# Patient Record
Sex: Female | Born: 1945 | Race: White | Hispanic: No | Marital: Married | Smoking: Former smoker
Health system: Southern US, Community
[De-identification: ages and names within clinical notes are randomized; demographics above are authoritative.]

## PROBLEM LIST (undated history)

## (undated) DIAGNOSIS — R51 Headache: Secondary | ICD-10-CM

## (undated) DIAGNOSIS — F419 Anxiety disorder, unspecified: Secondary | ICD-10-CM

## (undated) DIAGNOSIS — M545 Low back pain, unspecified: Secondary | ICD-10-CM

## (undated) DIAGNOSIS — M069 Rheumatoid arthritis, unspecified: Secondary | ICD-10-CM

## (undated) DIAGNOSIS — J449 Chronic obstructive pulmonary disease, unspecified: Secondary | ICD-10-CM

## (undated) DIAGNOSIS — Z860101 Personal history of adenomatous and serrated colon polyps: Secondary | ICD-10-CM

## (undated) DIAGNOSIS — M792 Neuralgia and neuritis, unspecified: Secondary | ICD-10-CM

## (undated) DIAGNOSIS — I10 Essential (primary) hypertension: Secondary | ICD-10-CM

## (undated) DIAGNOSIS — F32A Depression, unspecified: Secondary | ICD-10-CM

## (undated) DIAGNOSIS — S32010A Wedge compression fracture of first lumbar vertebra, initial encounter for closed fracture: Secondary | ICD-10-CM

## (undated) DIAGNOSIS — IMO0002 Reserved for concepts with insufficient information to code with codable children: Secondary | ICD-10-CM

## (undated) DIAGNOSIS — M719 Bursopathy, unspecified: Secondary | ICD-10-CM

## (undated) DIAGNOSIS — Z9851 Tubal ligation status: Secondary | ICD-10-CM

## (undated) DIAGNOSIS — G609 Hereditary and idiopathic neuropathy, unspecified: Secondary | ICD-10-CM

## (undated) DIAGNOSIS — R519 Headache, unspecified: Secondary | ICD-10-CM

## (undated) DIAGNOSIS — Z86010 Personal history of colonic polyps: Secondary | ICD-10-CM

## (undated) DIAGNOSIS — M810 Age-related osteoporosis without current pathological fracture: Secondary | ICD-10-CM

## (undated) DIAGNOSIS — M1990 Unspecified osteoarthritis, unspecified site: Secondary | ICD-10-CM

## (undated) DIAGNOSIS — I200 Unstable angina: Secondary | ICD-10-CM

## (undated) DIAGNOSIS — M542 Cervicalgia: Secondary | ICD-10-CM

## (undated) DIAGNOSIS — F329 Major depressive disorder, single episode, unspecified: Secondary | ICD-10-CM

## (undated) DIAGNOSIS — G8929 Other chronic pain: Secondary | ICD-10-CM

## (undated) DIAGNOSIS — K219 Gastro-esophageal reflux disease without esophagitis: Secondary | ICD-10-CM

## (undated) DIAGNOSIS — M67919 Unspecified disorder of synovium and tendon, unspecified shoulder: Secondary | ICD-10-CM

## (undated) DIAGNOSIS — W19XXXA Unspecified fall, initial encounter: Secondary | ICD-10-CM

## (undated) DIAGNOSIS — M359 Systemic involvement of connective tissue, unspecified: Secondary | ICD-10-CM

## (undated) DIAGNOSIS — H269 Unspecified cataract: Secondary | ICD-10-CM

## (undated) DIAGNOSIS — E119 Type 2 diabetes mellitus without complications: Secondary | ICD-10-CM

## (undated) DIAGNOSIS — M48061 Spinal stenosis, lumbar region without neurogenic claudication: Secondary | ICD-10-CM

## (undated) HISTORY — PX: CATARACT EXTRACTION W/ INTRAOCULAR LENS  IMPLANT, BILATERAL: SHX1307

## (undated) HISTORY — DX: Spinal stenosis, lumbar region without neurogenic claudication: M48.061

## (undated) HISTORY — DX: Tubal ligation status: Z98.51

## (undated) HISTORY — DX: Hereditary and idiopathic neuropathy, unspecified: G60.9

## (undated) HISTORY — DX: Personal history of adenomatous and serrated colon polyps: Z86.0101

## (undated) HISTORY — PX: BACK SURGERY: SHX140

## (undated) HISTORY — PX: LUMBAR LAMINECTOMY: SHX95

## (undated) HISTORY — DX: Low back pain, unspecified: M54.50

## (undated) HISTORY — DX: Unspecified disorder of synovium and tendon, unspecified shoulder: M67.919

## (undated) HISTORY — DX: Bursopathy, unspecified: M71.9

## (undated) HISTORY — DX: Unspecified cataract: H26.9

## (undated) HISTORY — DX: Low back pain: M54.5

## (undated) HISTORY — PX: TUBAL LIGATION: SHX77

## (undated) HISTORY — PX: BREAST BIOPSY: SHX20

## (undated) HISTORY — PX: OTHER SURGICAL HISTORY: SHX169

## (undated) HISTORY — PX: TONSILLECTOMY: SUR1361

## (undated) HISTORY — DX: Neuralgia and neuritis, unspecified: M79.2

## (undated) HISTORY — DX: Headache: R51

## (undated) HISTORY — PX: WRIST FUSION: SHX839

## (undated) HISTORY — DX: Type 2 diabetes mellitus without complications: E11.9

## (undated) HISTORY — PX: FOOT SURGERY: SHX648

## (undated) HISTORY — DX: Age-related osteoporosis without current pathological fracture: M81.0

## (undated) HISTORY — PX: HUMERUS FRACTURE SURGERY: SHX670

## (undated) HISTORY — PX: POSTERIOR LUMBAR FUSION: SHX6036

## (undated) HISTORY — DX: Headache, unspecified: R51.9

## (undated) HISTORY — DX: Cervicalgia: M54.2

## (undated) HISTORY — DX: Unspecified osteoarthritis, unspecified site: M19.90

## (undated) HISTORY — DX: Unstable angina: I20.0

## (undated) HISTORY — DX: Personal history of colonic polyps: Z86.010

---

## 2004-04-22 ENCOUNTER — Inpatient Hospital Stay: Payer: Self-pay | Admitting: Internal Medicine

## 2004-09-18 ENCOUNTER — Ambulatory Visit: Payer: Self-pay | Admitting: Obstetrics and Gynecology

## 2005-09-19 ENCOUNTER — Ambulatory Visit: Payer: Self-pay | Admitting: Obstetrics and Gynecology

## 2005-11-25 ENCOUNTER — Other Ambulatory Visit: Payer: Self-pay

## 2005-11-25 ENCOUNTER — Inpatient Hospital Stay: Payer: Self-pay | Admitting: Internal Medicine

## 2006-06-06 ENCOUNTER — Ambulatory Visit: Payer: Self-pay | Admitting: Podiatry

## 2006-09-23 ENCOUNTER — Ambulatory Visit: Payer: Self-pay | Admitting: Obstetrics and Gynecology

## 2006-09-25 ENCOUNTER — Emergency Department: Payer: Self-pay | Admitting: Emergency Medicine

## 2006-09-25 ENCOUNTER — Other Ambulatory Visit: Payer: Self-pay

## 2007-05-03 ENCOUNTER — Ambulatory Visit: Payer: Self-pay | Admitting: Emergency Medicine

## 2007-06-17 ENCOUNTER — Ambulatory Visit: Payer: Self-pay | Admitting: Unknown Physician Specialty

## 2007-08-17 ENCOUNTER — Ambulatory Visit: Payer: Self-pay | Admitting: Unknown Physician Specialty

## 2007-08-17 HISTORY — PX: COLONOSCOPY: SHX174

## 2007-10-29 ENCOUNTER — Ambulatory Visit: Payer: Self-pay | Admitting: Obstetrics and Gynecology

## 2007-12-31 ENCOUNTER — Ambulatory Visit: Payer: Self-pay | Admitting: Unknown Physician Specialty

## 2008-01-14 ENCOUNTER — Ambulatory Visit: Payer: Self-pay | Admitting: Unknown Physician Specialty

## 2008-01-14 HISTORY — PX: ESOPHAGOGASTRODUODENOSCOPY: SHX1529

## 2008-02-09 ENCOUNTER — Ambulatory Visit: Payer: Self-pay | Admitting: Gastroenterology

## 2008-02-19 ENCOUNTER — Emergency Department: Payer: Self-pay | Admitting: Emergency Medicine

## 2008-03-24 ENCOUNTER — Ambulatory Visit: Payer: Self-pay | Admitting: Unknown Physician Specialty

## 2008-04-05 ENCOUNTER — Ambulatory Visit: Payer: Self-pay | Admitting: Unknown Physician Specialty

## 2008-08-10 ENCOUNTER — Ambulatory Visit: Payer: Self-pay | Admitting: Unknown Physician Specialty

## 2009-02-28 ENCOUNTER — Ambulatory Visit: Payer: Self-pay | Admitting: Obstetrics and Gynecology

## 2009-05-26 ENCOUNTER — Emergency Department: Payer: Self-pay | Admitting: Emergency Medicine

## 2009-06-27 ENCOUNTER — Emergency Department: Payer: Self-pay | Admitting: Internal Medicine

## 2009-07-05 ENCOUNTER — Inpatient Hospital Stay: Payer: Self-pay | Admitting: Unknown Physician Specialty

## 2009-07-05 ENCOUNTER — Ambulatory Visit: Payer: Self-pay | Admitting: Unknown Physician Specialty

## 2009-07-24 ENCOUNTER — Emergency Department: Payer: Self-pay

## 2009-08-01 ENCOUNTER — Emergency Department: Payer: Self-pay | Admitting: Emergency Medicine

## 2009-09-02 ENCOUNTER — Ambulatory Visit: Payer: Self-pay | Admitting: Unknown Physician Specialty

## 2009-09-12 ENCOUNTER — Encounter: Payer: Self-pay | Admitting: Unknown Physician Specialty

## 2009-09-26 ENCOUNTER — Encounter: Payer: Self-pay | Admitting: Unknown Physician Specialty

## 2009-10-05 ENCOUNTER — Ambulatory Visit: Payer: Self-pay | Admitting: Anesthesiology

## 2009-10-17 ENCOUNTER — Ambulatory Visit: Payer: Self-pay | Admitting: Ophthalmology

## 2009-10-27 ENCOUNTER — Ambulatory Visit: Payer: Self-pay | Admitting: Anesthesiology

## 2009-11-21 ENCOUNTER — Ambulatory Visit: Payer: Self-pay | Admitting: Ophthalmology

## 2009-12-05 ENCOUNTER — Ambulatory Visit: Payer: Self-pay | Admitting: Anesthesiology

## 2010-01-01 ENCOUNTER — Ambulatory Visit: Payer: Self-pay | Admitting: Anesthesiology

## 2010-01-15 ENCOUNTER — Ambulatory Visit: Payer: Self-pay | Admitting: Unknown Physician Specialty

## 2010-02-02 ENCOUNTER — Ambulatory Visit: Payer: Self-pay | Admitting: Anesthesiology

## 2010-03-12 ENCOUNTER — Ambulatory Visit: Payer: Self-pay | Admitting: Obstetrics and Gynecology

## 2010-03-27 ENCOUNTER — Ambulatory Visit: Payer: Self-pay | Admitting: Unknown Physician Specialty

## 2010-03-29 ENCOUNTER — Ambulatory Visit: Payer: Self-pay | Admitting: Internal Medicine

## 2010-04-03 ENCOUNTER — Inpatient Hospital Stay: Payer: Self-pay | Admitting: Unknown Physician Specialty

## 2010-09-19 ENCOUNTER — Ambulatory Visit: Payer: Self-pay | Admitting: Family Medicine

## 2010-10-09 ENCOUNTER — Ambulatory Visit: Payer: Self-pay | Admitting: Unknown Physician Specialty

## 2010-10-09 HISTORY — PX: COLONOSCOPY: SHX174

## 2010-10-10 LAB — PATHOLOGY REPORT

## 2010-12-11 ENCOUNTER — Ambulatory Visit: Payer: Self-pay | Admitting: Internal Medicine

## 2010-12-24 ENCOUNTER — Ambulatory Visit: Payer: Self-pay | Admitting: Internal Medicine

## 2011-01-19 ENCOUNTER — Ambulatory Visit: Payer: Self-pay | Admitting: Internal Medicine

## 2011-01-19 ENCOUNTER — Observation Stay: Payer: Self-pay | Admitting: Internal Medicine

## 2011-02-12 ENCOUNTER — Ambulatory Visit: Payer: Self-pay | Admitting: Anesthesiology

## 2011-02-14 ENCOUNTER — Ambulatory Visit: Payer: Self-pay | Admitting: Anesthesiology

## 2011-03-18 ENCOUNTER — Ambulatory Visit: Payer: Self-pay | Admitting: Anesthesiology

## 2011-03-26 ENCOUNTER — Ambulatory Visit: Payer: Self-pay | Admitting: Obstetrics and Gynecology

## 2011-04-12 ENCOUNTER — Ambulatory Visit: Payer: Self-pay | Admitting: Anesthesiology

## 2011-04-16 ENCOUNTER — Emergency Department: Payer: Self-pay | Admitting: Emergency Medicine

## 2011-06-03 ENCOUNTER — Ambulatory Visit: Payer: Self-pay | Admitting: Surgery

## 2011-06-25 ENCOUNTER — Ambulatory Visit: Payer: Self-pay | Admitting: Unknown Physician Specialty

## 2011-06-25 DIAGNOSIS — I10 Essential (primary) hypertension: Secondary | ICD-10-CM

## 2011-07-02 ENCOUNTER — Inpatient Hospital Stay: Payer: Self-pay | Admitting: Unknown Physician Specialty

## 2011-07-09 ENCOUNTER — Emergency Department: Payer: Self-pay | Admitting: Emergency Medicine

## 2011-10-16 ENCOUNTER — Ambulatory Visit: Payer: Self-pay | Admitting: Family Medicine

## 2011-11-12 ENCOUNTER — Ambulatory Visit: Payer: Self-pay | Admitting: Anesthesiology

## 2011-11-27 ENCOUNTER — Emergency Department: Payer: Self-pay | Admitting: Unknown Physician Specialty

## 2012-01-23 ENCOUNTER — Ambulatory Visit: Payer: Self-pay | Admitting: Anesthesiology

## 2012-02-13 ENCOUNTER — Ambulatory Visit: Payer: Self-pay | Admitting: Unknown Physician Specialty

## 2012-05-17 ENCOUNTER — Ambulatory Visit: Payer: Self-pay | Admitting: Internal Medicine

## 2012-05-26 ENCOUNTER — Ambulatory Visit: Payer: Self-pay | Admitting: Anesthesiology

## 2012-07-07 ENCOUNTER — Ambulatory Visit: Payer: Self-pay | Admitting: Anesthesiology

## 2012-07-29 HISTORY — PX: LUMBAR DISC SURGERY: SHX700

## 2012-08-24 ENCOUNTER — Ambulatory Visit: Payer: Self-pay | Admitting: Podiatry

## 2012-08-25 ENCOUNTER — Ambulatory Visit: Payer: Self-pay | Admitting: Anesthesiology

## 2012-11-10 ENCOUNTER — Ambulatory Visit: Payer: Self-pay | Admitting: Anesthesiology

## 2012-11-26 DIAGNOSIS — S32010A Wedge compression fracture of first lumbar vertebra, initial encounter for closed fracture: Secondary | ICD-10-CM

## 2012-11-26 HISTORY — DX: Wedge compression fracture of first lumbar vertebra, initial encounter for closed fracture: S32.010A

## 2012-12-08 ENCOUNTER — Ambulatory Visit: Payer: Self-pay | Admitting: Anesthesiology

## 2012-12-25 ENCOUNTER — Emergency Department: Payer: Self-pay | Admitting: Emergency Medicine

## 2012-12-26 ENCOUNTER — Inpatient Hospital Stay (HOSPITAL_COMMUNITY)
Admission: EM | Admit: 2012-12-26 | Discharge: 2013-01-05 | DRG: 516 | Disposition: A | Discharge status: Home / Self Care | Payer: MEDICARE | Attending: Internal Medicine | Admitting: Internal Medicine

## 2012-12-26 ENCOUNTER — Encounter (HOSPITAL_COMMUNITY): Payer: Self-pay | Admitting: *Deleted

## 2012-12-26 ENCOUNTER — Emergency Department (HOSPITAL_COMMUNITY): Payer: MEDICARE

## 2012-12-26 DIAGNOSIS — M51379 Other intervertebral disc degeneration, lumbosacral region without mention of lumbar back pain or lower extremity pain: Secondary | ICD-10-CM | POA: Diagnosis present

## 2012-12-26 DIAGNOSIS — M549 Dorsalgia, unspecified: Secondary | ICD-10-CM

## 2012-12-26 DIAGNOSIS — T84498A Other mechanical complication of other internal orthopedic devices, implants and grafts, initial encounter: Secondary | ICD-10-CM | POA: Diagnosis present

## 2012-12-26 DIAGNOSIS — R4182 Altered mental status, unspecified: Secondary | ICD-10-CM | POA: Diagnosis not present

## 2012-12-26 DIAGNOSIS — M5137 Other intervertebral disc degeneration, lumbosacral region: Secondary | ICD-10-CM | POA: Diagnosis present

## 2012-12-26 DIAGNOSIS — X58XXXA Exposure to other specified factors, initial encounter: Secondary | ICD-10-CM | POA: Diagnosis present

## 2012-12-26 DIAGNOSIS — S32009A Unspecified fracture of unspecified lumbar vertebra, initial encounter for closed fracture: Principal | ICD-10-CM | POA: Diagnosis present

## 2012-12-26 DIAGNOSIS — Y832 Surgical operation with anastomosis, bypass or graft as the cause of abnormal reaction of the patient, or of later complication, without mention of misadventure at the time of the procedure: Secondary | ICD-10-CM | POA: Diagnosis present

## 2012-12-26 DIAGNOSIS — M545 Low back pain: Secondary | ICD-10-CM

## 2012-12-26 DIAGNOSIS — M069 Rheumatoid arthritis, unspecified: Secondary | ICD-10-CM

## 2012-12-26 DIAGNOSIS — R52 Pain, unspecified: Secondary | ICD-10-CM | POA: Diagnosis present

## 2012-12-26 DIAGNOSIS — Z981 Arthrodesis status: Secondary | ICD-10-CM

## 2012-12-26 DIAGNOSIS — R443 Hallucinations, unspecified: Secondary | ICD-10-CM | POA: Diagnosis not present

## 2012-12-26 DIAGNOSIS — M5459 Other low back pain: Secondary | ICD-10-CM

## 2012-12-26 DIAGNOSIS — T40605A Adverse effect of unspecified narcotics, initial encounter: Secondary | ICD-10-CM | POA: Diagnosis not present

## 2012-12-26 DIAGNOSIS — R109 Unspecified abdominal pain: Secondary | ICD-10-CM | POA: Diagnosis present

## 2012-12-26 DIAGNOSIS — IMO0002 Reserved for concepts with insufficient information to code with codable children: Secondary | ICD-10-CM

## 2012-12-26 DIAGNOSIS — I10 Essential (primary) hypertension: Secondary | ICD-10-CM

## 2012-12-26 DIAGNOSIS — M5126 Other intervertebral disc displacement, lumbar region: Secondary | ICD-10-CM

## 2012-12-26 DIAGNOSIS — F17200 Nicotine dependence, unspecified, uncomplicated: Secondary | ICD-10-CM | POA: Diagnosis present

## 2012-12-26 DIAGNOSIS — K5900 Constipation, unspecified: Secondary | ICD-10-CM | POA: Diagnosis present

## 2012-12-26 HISTORY — DX: Essential (primary) hypertension: I10

## 2012-12-26 HISTORY — DX: Rheumatoid arthritis, unspecified: M06.9

## 2012-12-26 HISTORY — DX: Reserved for concepts with insufficient information to code with codable children: IMO0002

## 2012-12-26 LAB — URINALYSIS, ROUTINE W REFLEX MICROSCOPIC
Glucose, UA: NEGATIVE mg/dL
Ketones, ur: NEGATIVE mg/dL
Leukocytes, UA: NEGATIVE
Nitrite: NEGATIVE
Specific Gravity, Urine: 1.021 (ref 1.005–1.030)
pH: 6.5 (ref 5.0–8.0)

## 2012-12-26 LAB — CBC WITH DIFFERENTIAL/PLATELET
HCT: 40.2 % (ref 36.0–46.0)
Hemoglobin: 14 g/dL (ref 12.0–15.0)
Lymphocytes Relative: 6 % — ABNORMAL LOW (ref 12–46)
Monocytes Absolute: 1.2 10*3/uL — ABNORMAL HIGH (ref 0.1–1.0)
Monocytes Relative: 7 % (ref 3–12)
Neutro Abs: 14.4 10*3/uL — ABNORMAL HIGH (ref 1.7–7.7)
Neutrophils Relative %: 86 % — ABNORMAL HIGH (ref 43–77)
RBC: 4.24 MIL/uL (ref 3.87–5.11)
WBC: 16.6 10*3/uL — ABNORMAL HIGH (ref 4.0–10.5)

## 2012-12-26 LAB — BASIC METABOLIC PANEL
BUN: 27 mg/dL — ABNORMAL HIGH (ref 6–23)
CO2: 25 mEq/L (ref 19–32)
Chloride: 102 mEq/L (ref 96–112)
Creatinine, Ser: 0.58 mg/dL (ref 0.50–1.10)
Potassium: 3.4 mEq/L — ABNORMAL LOW (ref 3.5–5.1)

## 2012-12-26 MED ORDER — ALUM & MAG HYDROXIDE-SIMETH 200-200-20 MG/5ML PO SUSP
30.0000 mL | Freq: Four times a day (QID) | ORAL | Status: DC | PRN
  Administered 2012-12-28 – 2013-01-04 (×3): 30 mL via ORAL
  Filled 2012-12-26 (×3): qty 30

## 2012-12-26 MED ORDER — ONDANSETRON HCL 4 MG PO TABS: 4.0000 mg | ORAL_TABLET | Freq: Four times a day (QID) | ORAL | Status: DC | PRN

## 2012-12-26 MED ORDER — HYDROMORPHONE HCL PF 1 MG/ML IJ SOLN
1.0000 mg | Freq: Once | INTRAMUSCULAR | Status: DC
Start: 2012-12-26 — End: 2012-12-26

## 2012-12-26 MED ORDER — MORPHINE SULFATE 4 MG/ML IJ SOLN
8.0000 mg | Freq: Once | INTRAMUSCULAR | Status: AC
  Administered 2012-12-26: 8 mg via INTRAVENOUS
  Filled 2012-12-26 (×2): qty 2

## 2012-12-26 MED ORDER — KETOROLAC TROMETHAMINE 30 MG/ML IJ SOLN
30.0000 mg | Freq: Once | INTRAMUSCULAR | Status: AC
  Administered 2012-12-26: 30 mg via INTRAVENOUS
  Filled 2012-12-26: qty 1

## 2012-12-26 MED ORDER — HYDROMORPHONE HCL PF 2 MG/ML IJ SOLN: 2.0000 mg | INTRAMUSCULAR | Status: DC | PRN

## 2012-12-26 MED ORDER — ACETAMINOPHEN 650 MG RE SUPP: 650.0000 mg | Freq: Four times a day (QID) | RECTAL | Status: DC | PRN

## 2012-12-26 MED ORDER — DIAZEPAM 5 MG/ML IJ SOLN
5.0000 mg | Freq: Once | INTRAMUSCULAR | Status: AC
  Administered 2012-12-26: 5 mg via INTRAVENOUS
  Filled 2012-12-26: qty 2

## 2012-12-26 MED ORDER — ZOLPIDEM TARTRATE 5 MG PO TABS
5.0000 mg | ORAL_TABLET | Freq: Every evening | ORAL | Status: DC | PRN
  Administered 2012-12-27 – 2013-01-03 (×2): 5 mg via ORAL
  Filled 2012-12-26 (×2): qty 1

## 2012-12-26 MED ORDER — ENOXAPARIN SODIUM 40 MG/0.4ML ~~LOC~~ SOLN
40.0000 mg | Freq: Every day | SUBCUTANEOUS | Status: DC
  Administered 2012-12-27 – 2013-01-03 (×8): 40 mg via SUBCUTANEOUS
  Filled 2012-12-26 (×8): qty 0.4

## 2012-12-26 MED ORDER — OXYCODONE HCL 5 MG PO TABS
5.0000 mg | ORAL_TABLET | ORAL | Status: DC | PRN
  Administered 2012-12-27 – 2012-12-29 (×7): 5 mg via ORAL
  Filled 2012-12-26 (×7): qty 1

## 2012-12-26 MED ORDER — ACETAMINOPHEN 325 MG PO TABS
650.0000 mg | ORAL_TABLET | Freq: Four times a day (QID) | ORAL | Status: DC | PRN
  Administered 2013-01-03: 650 mg via ORAL
  Filled 2012-12-26: qty 2

## 2012-12-26 MED ORDER — SODIUM CHLORIDE 0.9 % IV BOLUS (SEPSIS)
1000.0000 mL | Freq: Once | INTRAVENOUS | Status: AC
  Administered 2012-12-26: 1000 mL via INTRAVENOUS

## 2012-12-26 MED ORDER — MORPHINE SULFATE 4 MG/ML IJ SOLN
6.0000 mg | Freq: Once | INTRAMUSCULAR | Status: AC
  Administered 2012-12-26: 6 mg via INTRAVENOUS
  Filled 2012-12-26: qty 2

## 2012-12-26 MED ORDER — SODIUM CHLORIDE 0.9 % IV SOLN
INTRAVENOUS | Status: DC
  Administered 2012-12-27 – 2012-12-30 (×6): via INTRAVENOUS

## 2012-12-26 MED ORDER — ONDANSETRON HCL 4 MG/2ML IJ SOLN: 4.0000 mg | Freq: Four times a day (QID) | INTRAMUSCULAR | Status: DC | PRN

## 2012-12-26 NOTE — ED Provider Notes (Signed)
History     CSN: [id number]  Arrival date & time 12/26/12  1557   First MD Initiated Contact with Patient 12/26/12 1710      Chief Complaint  Patient presents with  . Back Pain    (Consider location/radiation/quality/duration/timing/severity/associated sxs/prior treatment) The history is provided by the patient.  Michelle Cabrera is a 67 y.o. female history of hypertension, chronic back pain status post multiple spine surgeries at [place] here with worsening back pain. Worsening back pain for the last 3 days. She denies any recent trauma or falls. The pain is severe enough that she is unable to walk. She went to [place] yesterday and got IM morphine and valium and was sent home. She was still having pain despite taking hydrocodone and was told by her rheumatologist to come here for neurosurgical eval. No incontinence or numbness, just pain.    Past Medical History  Diagnosis Date  . Hypertension     History reviewed. No pertinent past surgical history.  No family history on file.  History  Substance Use Topics  . Smoking status: Current Every Day Smoker  . Smokeless tobacco: Not on file  . Alcohol Use: No    OB History   Grav Para Term Preterm Abortions TAB SAB Ect Mult Living                  Review of Systems  Musculoskeletal: Positive for back pain.  All other systems reviewed and are negative.    Allergies  Dilaudid  Home Medications   Current Outpatient Rx  Name  Route  Sig  Dispense  Refill  . amLODipine (NORVASC) 5 MG tablet   Oral   Take 5 mg by mouth daily.         . Biotin 5000 MCG TABS   Oral   Take 5,000 mcg by mouth daily.         . Calcium Carbonate-Vit D-Min (CALCIUM-VITAMIN D-MINERALS) 600-400 MG-UNIT CHEW   Oral   Chew 4 tablets by mouth daily.         . cyclobenzaprine (FLEXERIL) 5 MG tablet   Oral   Take 5 mg by mouth 2 (two) times daily as needed for muscle spasms.         . DULoxetine (CYMBALTA) 30 MG capsule   Oral    Take 30 mg by mouth 2 (two) times daily.         Marland Kitchen esomeprazole (NEXIUM) 40 MG capsule   Oral   Take 40 mg by mouth daily before breakfast.         . Methotrexate, PF, 25 MG/0.4ML SOAJ   Subcutaneous   Inject 50 mg into the skin once a week.         Marland Kitchen oxyCODONE-acetaminophen (PERCOCET/ROXICET) 5-325 MG per tablet   Oral   Take 1 tablet by mouth every 6 (six) hours as needed for pain.         . predniSONE (DELTASONE) 50 MG tablet   Oral   Take 50 mg by mouth daily.         . Tofacitinib Citrate (XELJANZ) 5 MG TABS   Oral   Take 5 mg by mouth 2 (two) times daily.           BP 142/65  Pulse 81  Temp(Src) 98.7 F (37.1 C) (Oral)  Resp 24  SpO2 97%  Physical Exam  Nursing note and vitals reviewed. Constitutional: She is oriented to person, place, and time.  Uncomfortable, in  fetal position   HENT:  Head: Normocephalic.  Mouth/Throat: Oropharynx is clear and moist.  Eyes: Conjunctivae are normal. Pupils are equal, round, and reactive to light.  Neck: Normal range of motion. Neck supple.  Cardiovascular: Normal rate, regular rhythm and normal heart sounds.   Pulmonary/Chest: Effort normal and breath sounds normal. No respiratory distress. She has no wheezes. She has no rales.  Abdominal: Soft. Bowel sounds are normal. She exhibits no distension. There is no tenderness. There is no rebound.  Musculoskeletal: Normal range of motion.  Mild L paralumbar tenderness   Neurological: She is alert and oriented to person, place, and time.  4/5 lower extremities. Unable to perform straight leg raise since she has severe pain laying on her back. No saddle anesthesia. 2+ pulses   Skin: Skin is warm and dry.  Psychiatric: She has a normal mood and affect. Her behavior is normal. Judgment and thought content normal.    ED Course  Procedures (including critical care time)  Labs Reviewed  CBC WITH DIFFERENTIAL - Abnormal; Notable for the following:    WBC 16.6 (*)     Platelets 477 (*)    Neutrophils Relative % 86 (*)    Neutro Abs 14.4 (*)    Lymphocytes Relative 6 (*)    Monocytes Absolute 1.2 (*)    All other components within normal limits  BASIC METABOLIC PANEL - Abnormal; Notable for the following:    Potassium 3.4 (*)    Glucose, Bld 117 (*)    BUN 27 (*)    All other components within normal limits  URINALYSIS, ROUTINE W REFLEX MICROSCOPIC - Abnormal; Notable for the following:    APPearance CLOUDY (*)    All other components within normal limits   Mr Lumbar Spine Wo Contrast  12/26/2012   *RADIOLOGY REPORT*  Clinical Data: Weakness.  Lumbar disc disease.  Status post epidural injection 12/10/2012 with continued progression of symptoms.  Low back pain extending into the left lower extremity.  MRI LUMBAR SPINE WITHOUT CONTRAST  Technique:  Multiplanar and multiecho pulse sequences of the lumbar spine were obtained without intravenous contrast.  Comparison: None.  Findings: Normal signal is present in the conus medullaris which terminates at T12-L1, within normal limits.  The patient is status post posterior fusion at L2-3 and L3-4.  Edematous end plate marrow changes are present on the right at L1-2.  Leftward curvature of the lumbar spine is centered at L2-3.  The vertebral body heights and AP alignment are normal.  Limited imaging of the abdomen is unremarkable.  A benign appearing fluid collection is evident to below the subcutaneous fat at the fascial margin.  The disc levels at T12 and above are normal.  L1-2:  A rightward disc herniation is present.  Mild facet hypertrophy is noted.  This results in mild right lateral recess and foraminal narrowing.  L2-3:  The disc spacer is at the posterior margin of the vertebral body.  Mild right lateral recess and foraminal narrowing is present.  L3-4:  Patient is status post laminectomy.  The central canal and foramina are widely patent.  L4-5:  A mild leftward disc herniation is present.  Mild left foraminal  narrowing is evident.  L5-S1:  Moderate facet hypertrophy is worse on the right.  There is no significant stenosis.  IMPRESSION:  1.  Rightward disc herniation at L1-2 in concert with facet hypertrophy results in mild right lateral recess and foraminal narrowing. 2.  Mild right lateral recess and foraminal  narrowing at L2-3 with slight posterior displacement of the disc spacer. 3.  Status post laminectomy at L3-4 without evidence for residual or recurrent stenosis. 4.  Mild left foraminal narrowing at L4-5. 5.  Asymmetric right-sided facet arthropathy at L5-S1 without significant stenosis.   Original Report Authenticated By: Marin Roberts, M.D.     No diagnosis found.    MDM  Michelle Cabrera is a 67 y.o. female here with back pain. Likely worsening chronic pain possibly from slipped disk. Will get MRI and give pain meds and reassess.   8 PM WBC 16, UA nl. No epidural abscess on MRI. MRI showed L1-2 disc herniation and diffuse disease with no spinal compression.   10:57 PM Patient still in pain after morphine, toradol and valium. Will admit for pain control on med/surg under Dr. Lovell Sheehan.        Richardean Canal, MD 12/26/12 [phone]

## 2012-12-26 NOTE — ED Notes (Signed)
Patient transported to MRI via stretcher.

## 2012-12-26 NOTE — ED Notes (Signed)
The pt is c/o lower back pain.  She has had back surgeries and has chronic back pain.  She has had more pain since Wednesday she was seen at [facility] ed yesterday and had shots.  She still has pain

## 2012-12-27 ENCOUNTER — Encounter (HOSPITAL_COMMUNITY): Payer: Self-pay | Admitting: Internal Medicine

## 2012-12-27 ENCOUNTER — Inpatient Hospital Stay (HOSPITAL_COMMUNITY): Payer: MEDICARE

## 2012-12-27 DIAGNOSIS — M5126 Other intervertebral disc displacement, lumbar region: Secondary | ICD-10-CM

## 2012-12-27 DIAGNOSIS — M545 Low back pain: Secondary | ICD-10-CM

## 2012-12-27 DIAGNOSIS — IMO0002 Reserved for concepts with insufficient information to code with codable children: Secondary | ICD-10-CM | POA: Diagnosis present

## 2012-12-27 DIAGNOSIS — M549 Dorsalgia, unspecified: Secondary | ICD-10-CM

## 2012-12-27 DIAGNOSIS — I10 Essential (primary) hypertension: Secondary | ICD-10-CM | POA: Diagnosis present

## 2012-12-27 DIAGNOSIS — M069 Rheumatoid arthritis, unspecified: Secondary | ICD-10-CM

## 2012-12-27 LAB — BASIC METABOLIC PANEL
BUN: 19 mg/dL (ref 6–23)
CO2: 28 mEq/L (ref 19–32)
Calcium: 8.6 mg/dL (ref 8.4–10.5)
Chloride: 104 mEq/L (ref 96–112)
Creatinine, Ser: 0.64 mg/dL (ref 0.50–1.10)
Glucose, Bld: 129 mg/dL — ABNORMAL HIGH (ref 70–99)

## 2012-12-27 LAB — CBC
HCT: 37.6 % (ref 36.0–46.0)
MCH: 31.8 pg (ref 26.0–34.0)
MCHC: 33.2 g/dL (ref 30.0–36.0)
MCV: 95.7 fL (ref 78.0–100.0)
Platelets: 444 10*3/uL — ABNORMAL HIGH (ref 150–400)
RDW: 15.1 % (ref 11.5–15.5)

## 2012-12-27 MED ORDER — CYCLOBENZAPRINE HCL 5 MG PO TABS
7.5000 mg | ORAL_TABLET | Freq: Three times a day (TID) | ORAL | Status: DC | PRN
  Administered 2012-12-27 – 2013-01-03 (×8): 7.5 mg via ORAL
  Filled 2012-12-27 (×8): qty 1.5

## 2012-12-27 MED ORDER — PREGABALIN 25 MG PO CAPS
75.0000 mg | ORAL_CAPSULE | Freq: Three times a day (TID) | ORAL | Status: DC
Start: 2012-12-27 — End: 2013-01-05
  Administered 2012-12-27 – 2013-01-05 (×28): 75 mg via ORAL
  Filled 2012-12-27 (×13): qty 3
  Filled 2012-12-27: qty 2
  Filled 2012-12-27: qty 1
  Filled 2012-12-27: qty 3
  Filled 2012-12-27: qty 1
  Filled 2012-12-27 (×6): qty 3
  Filled 2012-12-27: qty 2
  Filled 2012-12-27 (×2): qty 3
  Filled 2012-12-27: qty 2
  Filled 2012-12-27 (×3): qty 3
  Filled 2012-12-27: qty 1

## 2012-12-27 MED ORDER — MORPHINE SULFATE 2 MG/ML IJ SOLN
2.0000 mg | INTRAMUSCULAR | Status: DC | PRN
  Administered 2012-12-27 (×3): 4 mg via INTRAVENOUS
  Administered 2012-12-27: 2 mg via INTRAVENOUS
  Administered 2012-12-28 – 2012-12-30 (×11): 4 mg via INTRAVENOUS
  Filled 2012-12-27 (×16): qty 2

## 2012-12-27 MED ORDER — NICOTINE 14 MG/24HR TD PT24
14.0000 mg | MEDICATED_PATCH | Freq: Every day | TRANSDERMAL | Status: DC
  Administered 2012-12-27 – 2013-01-05 (×9): 14 mg via TRANSDERMAL
  Filled 2012-12-27 (×10): qty 1

## 2012-12-27 NOTE — H&P (Signed)
[facility] History and Physical  Michelle Cabrera AVW:[id number] DOB: November 14, 1945 DOA: 12/26/2012  Referring physician: EDP PCP: Pcp Not In System  Specialists:   Chief Complaint: Severe Low Back Pain  HPI: Michelle Cabrera is a 67 y.o. female with a history of Rheumatoid Arthritis and DDD who presents to the ED with complaints of severe intractable back pain.   She reports having paithat has been worse over the past 3 days, and she went to the ED at [place] 1 days ago and was seen and administered injections but still had no relief.  She report s having increased pain and difficulty walking due to the pain.  The pain is described as 10/10 shrap and burning pain that radiates from he back into both of her upper legs.   She denies having any loss of bowel or bladder control.      Review of Systems: The patient denies anorexia, fever, chills, weight loss, vision loss, decreased hearing, hoarseness, chest pain, syncope, dyspnea on exertion, peripheral edema, balance deficits, hemoptysis, abdominal pain, nausea, vomiting, diarrhea, constipation, hematemesis, melena, hematochezia, severe indigestion/heartburn, hematuria, incontinence, genital sores, muscle weakness, suspicious skin lesions, transient blindness, depression, unusual weight change, abnormal bleeding, enlarged lymph nodes, angioedema, and breast masses.    Past Medical History  Diagnosis Date  . Hypertension   . Rheumatoid arthritis   . DDD (degenerative disc disease)      Past Surgical History  Procedure Laterality Date  . Lumbar disc surgery       X 3     Medications:  HOME MEDS: Prior to Admission medications   Medication Sig Start Date End Date Taking? Authorizing Provider  amLODipine (NORVASC) 5 MG tablet Take 5 mg by mouth daily.   Yes Historical Provider, MD  Biotin 5000 MCG TABS Take 5,000 mcg by mouth daily.   Yes Historical Provider, MD  Calcium Carbonate-Vit D-Min (CALCIUM-VITAMIN D-MINERALS) 600-400 MG-UNIT  CHEW Chew 4 tablets by mouth daily.   Yes Historical Provider, MD  cyclobenzaprine (FLEXERIL) 5 MG tablet Take 5 mg by mouth 2 (two) times daily as needed for muscle spasms.   Yes Historical Provider, MD  DULoxetine (CYMBALTA) 30 MG capsule Take 30 mg by mouth 2 (two) times daily.   Yes Historical Provider, MD  esomeprazole (NEXIUM) 40 MG capsule Take 40 mg by mouth daily before breakfast.   Yes Historical Provider, MD  Methotrexate, PF, 25 MG/0.4ML SOAJ Inject 50 mg into the skin once a week.   Yes Historical Provider, MD  oxyCODONE-acetaminophen (PERCOCET/ROXICET) 5-325 MG per tablet Take 1 tablet by mouth every 6 (six) hours as needed for pain.   Yes Historical Provider, MD  predniSONE (DELTASONE) 50 MG tablet Take 50 mg by mouth daily.   Yes Historical Provider, MD  Tofacitinib Citrate (XELJANZ) 5 MG TABS Take 5 mg by mouth 2 (two) times daily.   Yes Historical Provider, MD    Allergies:  Allergies  Allergen Reactions  . Dilaudid (Hydromorphone Hcl)     Made tongue swell    Social History:   reports that she has been smoking.  She does not have any smokeless tobacco history on file. She reports that she does not drink alcohol. Her drug history is not on file.  Family History: Family History  Problem Relation Age of Onset  . CAD Father   . CAD Mother   . Hypertension Mother   . Peripheral vascular disease Mother     Physical Exam:  GEN:  Pleasant 67 year old  examined  and in no acute distress; cooperative with exam Filed Vitals:   12/26/12 2030 12/26/12 2100 12/26/12 2130 12/26/12 2200  BP: 143/65 154/78 140/60 142/65  Pulse: 89 92 87 81  Temp:      TempSrc:      Resp:      SpO2: 98% 98% 98% 97%   Blood pressure 142/65, pulse 81, temperature 98.7 F (37.1 C), temperature source Oral, resp. rate 24, SpO2 97.00%. PSYCH: She is alert and oriented x4; does not appear anxious does not appear depressed; affect is normal HEENT: Normocephalic and Atraumatic, Mucous membranes  pink; PERRLA; EOM intact; Fundi:  Benign;  No scleral icterus, Nares: Patent, Oropharynx: Clear,Fair Dentition, Neck:  FROM, no cervical lymphadenopathy nor thyromegaly or carotid bruit; no JVD; Breasts:: Not examined CHEST WALL: No tenderness CHEST: Normal respiration, clear to auscultation bilaterally HEART: Regular rate and rhythm; no murmurs rubs or gallops BACK: No kyphosis or scoliosis; no CVA tenderness ABDOMEN: Positive Bowel Sounds, Scaphoid, Obese, soft non-tender; no masses, no organomegaly, no pannus; no intertriginous candida. Rectal Exam: Not done EXTREMITIES: No cyanosis, clubbing or edema; no ulcerations. Genitalia: not examined PULSES: 2+ and symmetric SKIN: Normal hydration no rash or ulceration CNS: Cranial nerves 2-12 grossly intact no focal neurologic deficit   Labs & Imaging Results for orders placed during the hospital encounter of 12/26/12 (from the past 48 hour(s))  CBC WITH DIFFERENTIAL     Status: Abnormal   Collection Time    12/26/12  5:26 PM      Result Value Range   WBC 16.6 (*) 4.0 - 10.5 K/uL   RBC 4.24  3.87 - 5.11 MIL/uL   Hemoglobin 14.0  12.0 - 15.0 g/dL   HCT 16.1  09.6 - 04.5 %   MCV 94.8  78.0 - 100.0 fL   MCH 33.0  26.0 - 34.0 pg   MCHC 34.8  30.0 - 36.0 g/dL   RDW 40.9  81.1 - 91.4 %   Platelets 477 (*) 150 - 400 K/uL   Neutrophils Relative % 86 (*) 43 - 77 %   Neutro Abs 14.4 (*) 1.7 - 7.7 K/uL   Lymphocytes Relative 6 (*) 12 - 46 %   Lymphs Abs 1.0  0.7 - 4.0 K/uL   Monocytes Relative 7  3 - 12 %   Monocytes Absolute 1.2 (*) 0.1 - 1.0 K/uL   Eosinophils Relative 0  0 - 5 %   Eosinophils Absolute 0.0  0.0 - 0.7 K/uL   Basophils Relative 0  0 - 1 %   Basophils Absolute 0.0  0.0 - 0.1 K/uL  BASIC METABOLIC PANEL     Status: Abnormal   Collection Time    12/26/12  5:26 PM      Result Value Range   Sodium 137  135 - 145 mEq/L   Potassium 3.4 (*) 3.5 - 5.1 mEq/L   Chloride 102  96 - 112 mEq/L   CO2 25  19 - 32 mEq/L   Glucose, Bld  117 (*) 70 - 99 mg/dL   BUN 27 (*) 6 - 23 mg/dL   Creatinine, Ser 7.82  0.50 - 1.10 mg/dL   Calcium 9.4  8.4 - 95.6 mg/dL   GFR calc non Af Amer >90  >90 mL/min   GFR calc Af Amer >90  >90 mL/min   Comment:            The eGFR has been calculated     using the CKD EPI  equation.     This calculation has not been     validated in all clinical     situations.     eGFR's persistently     <90 mL/min signify     possible Chronic Kidney Disease.  URINALYSIS, ROUTINE W REFLEX MICROSCOPIC     Status: Abnormal   Collection Time    12/26/12  8:33 PM      Result Value Range   Color, Urine YELLOW  YELLOW   APPearance CLOUDY (*) CLEAR   Specific Gravity, Urine 1.021  1.005 - 1.030   pH 6.5  5.0 - 8.0   Glucose, UA NEGATIVE  NEGATIVE mg/dL   Hgb urine dipstick NEGATIVE  NEGATIVE   Bilirubin Urine NEGATIVE  NEGATIVE   Ketones, ur NEGATIVE  NEGATIVE mg/dL   Protein, ur NEGATIVE  NEGATIVE mg/dL   Urobilinogen, UA 1.0  0.0 - 1.0 mg/dL   Nitrite NEGATIVE  NEGATIVE   Leukocytes, UA NEGATIVE  NEGATIVE   Comment: MICROSCOPIC NOT DONE ON URINES WITH NEGATIVE PROTEIN, BLOOD, LEUKOCYTES, NITRITE, OR GLUCOSE <1000 mg/dL.    Radiological Exams on Admission: Mr Lumbar Spine Wo Contrast  12/26/2012   *RADIOLOGY REPORT*  Clinical Data: Weakness.  Lumbar disc disease.  Status post epidural injection 12/10/2012 with continued progression of symptoms.  Low back pain extending into the left lower extremity.  MRI LUMBAR SPINE WITHOUT CONTRAST  Technique:  Multiplanar and multiecho pulse sequences of the lumbar spine were obtained without intravenous contrast.  Comparison: None.  Findings: Normal signal is present in the conus medullaris which terminates at T12-L1, within normal limits.  The patient is status post posterior fusion at L2-3 and L3-4.  Edematous end plate marrow changes are present on the right at L1-2.  Leftward curvature of the lumbar spine is centered at L2-3.  The vertebral body heights and AP  alignment are normal.  Limited imaging of the abdomen is unremarkable.  A benign appearing fluid collection is evident to below the subcutaneous fat at the fascial margin.  The disc levels at T12 and above are normal.  L1-2:  A rightward disc herniation is present.  Mild facet hypertrophy is noted.  This results in mild right lateral recess and foraminal narrowing.  L2-3:  The disc spacer is at the posterior margin of the vertebral body.  Mild right lateral recess and foraminal narrowing is present.  L3-4:  Patient is status post laminectomy.  The central canal and foramina are widely patent.  L4-5:  A mild leftward disc herniation is present.  Mild left foraminal narrowing is evident.  L5-S1:  Moderate facet hypertrophy is worse on the right.  There is no significant stenosis.  IMPRESSION:  1.  Rightward disc herniation at L1-2 in concert with facet hypertrophy results in mild right lateral recess and foraminal narrowing. 2.  Mild right lateral recess and foraminal narrowing at L2-3 with slight posterior displacement of the disc spacer. 3.  Status post laminectomy at L3-4 without evidence for residual or recurrent stenosis. 4.  Mild left foraminal narrowing at L4-5. 5.  Asymmetric right-sided facet arthropathy at L5-S1 without significant stenosis.   Original Report Authenticated By: Marin Roberts, M.D.      Assessment/Plan Principal Problem:   Intractable low back pain Active Problems:   DDD (degenerative disc disease)   Rheumatoid arthritis   Hypertension   Tobacco Use Disorder   1. Intractable Low Back Pain-  Due to DDD and Herniated Disk L1-L2 on MRI-  Consult Neuro Surgery  in AM, Pain Control PRN.     2.  DDD- See #1.    3.  RA- Continue Methotrexate Rx,    4.  HTN-   Continue, Monitor BPS, PRN IV hydralazine.    5.  Tobacco Use Disorder-  Smoking Cessation discussed,  Nicotine Patch ordered.      Code Status:  FULL CODE Family Communication:  Sister at Bedside Disposition  Plan:  Return to Home on Discharge  Time spent: 65 Minutes  Ron Parker [facility] Pager [phone]  If 7PM-7AM, please contact night-coverage [email or web address] Password [email or web address] 12/27/2012, 12:21 AM

## 2012-12-27 NOTE — Consult Note (Signed)
Reason for Consult:  Severe low back pain Referring Physician:  Dr. Donnalee Curry ([facility])  Michelle Cabrera is an 67 y.o. female.  HPI: Patient is 67 year old right-handed white female who seen in neurosurgical consultation at the request of Dr. Suanne Marker regarding severe low back pain.  Patient has a history of problems with her back for many years. She's undergone 3 previous lumbar surgeries by Dr. Ruthann Cancer in [place], [place]. Surgeries were done in September 2010, December 2011 (a lumbar fusion), and most recently in December 2012. She is followed by her primary physician Dr. Einar Crow at the [facility] in [place], [place], Dr. Gwenyth Bender from the [facility] pain clinic in [place], [place], and Dr. Lamont Dowdy, her rheumatologist at the [facility] in [place], [place].  She explains that she's had continued difficulties with her low back, and that Dr. Pernell Dupre has been doing periodic epidural steroid injections for her, including having done 3 ESIs so for this year. She further explained that she's had a history of rheumatoid arthritis for 35 years and is treated by Dr. Gavin Potters with methotrexate.  She says that she had been in relatively stable condition until one week ago when she bent over and developed discomfort in her back. The pain worsened 4 days ago, with increased pain across the low back and into the buttocks and hips bilaterally, extending into the anterior thighs bilaterally. She was seen at the [facility] emergency room and started on prednisone. She was told by Dr. Gavin Potters to go to the [facility] emergency room for further treatment and care.  Symptomatically in addition to the pain she does describe chronic numbness, tingling, and burning in the legs and feet bilaterally, that has been present for a long time. It is no worse recently. She does not have any specific weakness, nor urinary incontinence, nor  constipation.  Since admission to the [facility] she has been treated with morphine IV, OxyIR, Toradol, and Lyrica. She has been study with an MRI of the lumbar spine without contrast which shows postsurgical changes from L2-L4, including a decompressive lumbar laminectomy, and the L2-3 and L3-4 interbody arthrodesis and posterior lateral arthrodesis with implants and pedicle screw posterior instrumentation. This study also shows some disc bulging to the right at L1-2, as well as some apparent scoliotic appearance.  Past Medical History:  Past Medical History  Diagnosis Date  . Hypertension   . Rheumatoid arthritis   . DDD (degenerative disc disease)     Past Surgical History:  Past Surgical History  Procedure Laterality Date  . Lumbar disc surgery       X 3    Family History:  Family History  Problem Relation Age of Onset  . CAD Father   . CAD Mother   . Hypertension Mother   . Peripheral vascular disease Mother     Social History:  reports that she has been smoking.  She does not have any smokeless tobacco history on file. She reports that she does not drink alcohol. Her drug history is not on file.  Patient smokes one pack per day, and has smoked for 50 years.   Allergies:  Allergies  Allergen Reactions  . Dilaudid (Hydromorphone Hcl)     Made tongue swell    Medications: I have reviewed the patient's current medications.  Review of systems: Notable for those difficulties described inner history of present illness and past medical history, but is otherwise unremarkable.  Physical Examination:  patient is a well-developed well-nourished white female, in discomfort, but in no acute distress.  Blood pressure 133/53, pulse 87, temperature 97.9 F (36.6 C), temperature source Oral, resp. rate 18, height 5\' 4"  (1.626 m), weight 68.2 kg (150 lb 5.7 oz), SpO2 97.00%. Lungs:   clear to auscultation, she has symmetrical respiratory excursion. Heart:   regular  rate and rhythm, normal S1 and S2, no murmur.  Abdomen:   soft, nondistended, nontender, bowel sounds present.  Extremity:   no clubbing, cyanosis, or edema.  Musculoskeletal:   diffuse central position the lumbar region without specific point tenderness. Incisions well-healed.   Neurological Examination: Mental Status Examination:   awake alert, fully oriented.  Cranial Nerve Examination:   pupils equal and react to light, EOMs I., facial movement symmetrical. Motor Examination:   5/5 strength in the lower extremities including the iliopsoas, quadriceps, dorsiflexor, EHL, and plantar flexor.  Sensory Examination:   intact to pinprick to the legs and feet bilaterally.  Reflex Examination:    left quadriceps is absent, right quadriceps is 2, gastrocnemius is minimal bilaterally, toes are downgoing bilaterally  Gait and Stance Examination:  Not tested due to the patient's extensive discomfort.    Results for orders placed during the hospital encounter of 12/26/12 (from the past 48 hour(s))  CBC WITH DIFFERENTIAL     Status: Abnormal   Collection Time    12/26/12  5:26 PM      Result Value Range   WBC 16.6 (*) 4.0 - 10.5 K/uL   RBC 4.24  3.87 - 5.11 MIL/uL   Hemoglobin 14.0  12.0 - 15.0 g/dL   HCT 16.1  09.6 - 04.5 %   MCV 94.8  78.0 - 100.0 fL   MCH 33.0  26.0 - 34.0 pg   MCHC 34.8  30.0 - 36.0 g/dL   RDW 40.9  81.1 - 91.4 %   Platelets 477 (*) 150 - 400 K/uL   Neutrophils Relative % 86 (*) 43 - 77 %   Neutro Abs 14.4 (*) 1.7 - 7.7 K/uL   Lymphocytes Relative 6 (*) 12 - 46 %   Lymphs Abs 1.0  0.7 - 4.0 K/uL   Monocytes Relative 7  3 - 12 %   Monocytes Absolute 1.2 (*) 0.1 - 1.0 K/uL   Eosinophils Relative 0  0 - 5 %   Eosinophils Absolute 0.0  0.0 - 0.7 K/uL   Basophils Relative 0  0 - 1 %   Basophils Absolute 0.0  0.0 - 0.1 K/uL  BASIC METABOLIC PANEL     Status: Abnormal   Collection Time    12/26/12  5:26 PM      Result Value Range   Sodium 137  135 - 145 mEq/L   Potassium  3.4 (*) 3.5 - 5.1 mEq/L   Chloride 102  96 - 112 mEq/L   CO2 25  19 - 32 mEq/L   Glucose, Bld 117 (*) 70 - 99 mg/dL   BUN 27 (*) 6 - 23 mg/dL   Creatinine, Ser 7.82  0.50 - 1.10 mg/dL   Calcium 9.4  8.4 - 95.6 mg/dL   GFR calc non Af Amer >90  >90 mL/min   GFR calc Af Amer >90  >90 mL/min   Comment:            The eGFR has been calculated     using the CKD EPI equation.     This calculation has not been  validated in all clinical     situations.     eGFR's persistently     <90 mL/min signify     possible Chronic Kidney Disease.  URINALYSIS, ROUTINE W REFLEX MICROSCOPIC     Status: Abnormal   Collection Time    12/26/12  8:33 PM      Result Value Range   Color, Urine YELLOW  YELLOW   APPearance CLOUDY (*) CLEAR   Specific Gravity, Urine 1.021  1.005 - 1.030   pH 6.5  5.0 - 8.0   Glucose, UA NEGATIVE  NEGATIVE mg/dL   Hgb urine dipstick NEGATIVE  NEGATIVE   Bilirubin Urine NEGATIVE  NEGATIVE   Ketones, ur NEGATIVE  NEGATIVE mg/dL   Protein, ur NEGATIVE  NEGATIVE mg/dL   Urobilinogen, UA 1.0  0.0 - 1.0 mg/dL   Nitrite NEGATIVE  NEGATIVE   Leukocytes, UA NEGATIVE  NEGATIVE   Comment: MICROSCOPIC NOT DONE ON URINES WITH NEGATIVE PROTEIN, BLOOD, LEUKOCYTES, NITRITE, OR GLUCOSE <1000 mg/dL.  BASIC METABOLIC PANEL     Status: Abnormal   Collection Time    12/27/12  5:05 AM      Result Value Range   Sodium 140  135 - 145 mEq/L   Potassium 3.5  3.5 - 5.1 mEq/L   Chloride 104  96 - 112 mEq/L   CO2 28  19 - 32 mEq/L   Glucose, Bld 129 (*) 70 - 99 mg/dL   BUN 19  6 - 23 mg/dL   Creatinine, Ser 1.61  0.50 - 1.10 mg/dL   Calcium 8.6  8.4 - 09.6 mg/dL   GFR calc non Af Amer >90  >90 mL/min   GFR calc Af Amer >90  >90 mL/min   Comment:            The eGFR has been calculated     using the CKD EPI equation.     This calculation has not been     validated in all clinical     situations.     eGFR's persistently     <90 mL/min signify     possible Chronic Kidney Disease.  CBC      Status: Abnormal   Collection Time    12/27/12  5:05 AM      Result Value Range   WBC 13.0 (*) 4.0 - 10.5 K/uL   RBC 3.93  3.87 - 5.11 MIL/uL   Hemoglobin 12.5  12.0 - 15.0 g/dL   HCT 04.5  40.9 - 81.1 %   MCV 95.7  78.0 - 100.0 fL   MCH 31.8  26.0 - 34.0 pg   MCHC 33.2  30.0 - 36.0 g/dL   RDW 91.4  78.2 - 95.6 %   Platelets 444 (*) 150 - 400 K/uL    Mr Lumbar Spine Wo Contrast  12/26/2012   *RADIOLOGY REPORT*  Clinical Data: Weakness.  Lumbar disc disease.  Status post epidural injection 12/10/2012 with continued progression of symptoms.  Low back pain extending into the left lower extremity.  MRI LUMBAR SPINE WITHOUT CONTRAST  Technique:  Multiplanar and multiecho pulse sequences of the lumbar spine were obtained without intravenous contrast.  Comparison: None.  Findings: Normal signal is present in the conus medullaris which terminates at T12-L1, within normal limits.  The patient is status post posterior fusion at L2-3 and L3-4.  Edematous end plate marrow changes are present on the right at L1-2.  Leftward curvature of the lumbar spine is centered at L2-3.  The  vertebral body heights and AP alignment are normal.  Limited imaging of the abdomen is unremarkable.  A benign appearing fluid collection is evident to below the subcutaneous fat at the fascial margin.  The disc levels at T12 and above are normal.  L1-2:  A rightward disc herniation is present.  Mild facet hypertrophy is noted.  This results in mild right lateral recess and foraminal narrowing.  L2-3:  The disc spacer is at the posterior margin of the vertebral body.  Mild right lateral recess and foraminal narrowing is present.  L3-4:  Patient is status post laminectomy.  The central canal and foramina are widely patent.  L4-5:  A mild leftward disc herniation is present.  Mild left foraminal narrowing is evident.  L5-S1:  Moderate facet hypertrophy is worse on the right.  There is no significant stenosis.  IMPRESSION:  1.  Rightward  disc herniation at L1-2 in concert with facet hypertrophy results in mild right lateral recess and foraminal narrowing. 2.  Mild right lateral recess and foraminal narrowing at L2-3 with slight posterior displacement of the disc spacer. 3.  Status post laminectomy at L3-4 without evidence for residual or recurrent stenosis. 4.  Mild left foraminal narrowing at L4-5. 5.  Asymmetric right-sided facet arthropathy at L5-S1 without significant stenosis.   Original Report Authenticated By: Marin Roberts, M.D.     Assessment/Plan: Patient with long history of back problems, and a history of rheumatoid arthritis for 35 years, followed by her primary physician Dr. Einar Crow, her rheumatologist Dr. Lamont Dowdy, and her pain management physician Dr. Gwenyth Bender, all of [place], [place]. She's had 3 previous lumbar surgeries by Dr. Ruthann Cancer, also in [place], [place].  Patient has had an acute exacerbation of chronic back discomfort, but her neurologic examination shows intact strength and sensation. MRI scan yesterday shows postsurgical changes as well as mild degenerative changes. There is some disc bulging to the right at L1-2, but I don't believe that that would explain the extent of pain and discomfort that she is experiencing bilaterally.   I spoke with the patient and her husband at length at her bedside, discussed my neurologic examination and review of her MRI scan. I've explained that we can evaluate this further with x-rays of the thoracic and lumbar spine, and a CT scan of the lumbar spine with out contrast, but with sagittal and coronal bone window reconstructions to assess the stability of her spine and the extent of her bony fusion. However in the end I tend to doubt that I will recommend further surgical intervention, and I believe this patient will need to continue to be treated nonsurgically, by her existing treating physicians in [place], [place]. I tend to doubt that I will have anything to offer her from a surgical perspective.  In the meantime I have ordered the x-rays and CT scan suggested above. The patient may well benefit from physical therapy and I will defer ordering such to her treating physicians.  Hewitt Shorts, MD 12/27/2012, 4:00 PM

## 2012-12-27 NOTE — Progress Notes (Signed)
[facility] PROGRESS NOTE  Nioka Thorington ZOX:[id number] DOB: [birth year] DOA: 12/26/2012 PCP: Pcp Not In System  I have seen and examined pt who is a 67yo admitted this am by Dr Lovell Sheehan with intractable low back pain and h/o Rheumatoid Arthritis and DDD, MRI with L1-2 rightward disc herniation is present. Mild facet hypertrophy is noted with mild right lateral recess and foraminal narrowing. On follow up pt still with increased pain, I have consulted NS and Dr Newell Coral to see pt for further recs, will continue current management plan as per Dr Lovell Sheehan and follow.   Kela Millin  [facility] Pager [phone]. If 7PM-7AM, please contact night-coverage at [email or web address], password [facility] 12/27/2012, 8:29 AM  LOS: 1 day

## 2012-12-28 LAB — CBC
MCH: 31.7 pg (ref 26.0–34.0)
MCHC: 33.3 g/dL (ref 30.0–36.0)
RDW: 15.3 % (ref 11.5–15.5)

## 2012-12-28 LAB — BASIC METABOLIC PANEL
Calcium: 8.2 mg/dL — ABNORMAL LOW (ref 8.4–10.5)
GFR calc Af Amer: >90 mL/min (ref >90)
GFR calc non Af Amer: >90 mL/min (ref >90)
Glucose, Bld: 111 mg/dL — ABNORMAL HIGH (ref 70–99)
Potassium: 3.7 mEq/L (ref 3.5–5.1)
Sodium: 136 mEq/L (ref 135–145)

## 2012-12-28 MED ORDER — ESOMEPRAZOLE MAGNESIUM 40 MG PO CPDR
40.0000 mg | DELAYED_RELEASE_CAPSULE | Freq: Every day | ORAL | Status: DC
  Administered 2012-12-28 – 2013-01-05 (×8): 40 mg via ORAL
  Filled 2012-12-28 (×12): qty 1

## 2012-12-28 NOTE — Progress Notes (Addendum)
[facility] PROGRESS NOTE  Michelle Cabrera HQI:[id number] DOB: Aug 11, 1945 DOA: 12/26/2012 PCP: Pcp Not In System  Assessment/Plan:  1. Intractable Low Back Pain - Due to DDD and Herniated Disk L1-L2 on MRI, CT also with possible acute L1compression fracture  And Postsurgical changes status post L2-L4 laminectomy and PLIF. There is loosening of the L4 pedicle screws and posterior  displacement of the interbody spacers, especially the L3-L4 spacer -appreciate NS assistance, Dr Newell Coral to see for further recs -continue pain management -consult PT/OT 2. DDD- See #1.  3. RA- Continue Methotrexate Rx,  4. HTN- Continue, Monitor BPS, PRN IV hydralazine.  5. Tobacco Use Disorder- Smoking Cessation discussed, Nicotine Patch ordered.     Code Status: FULL Family Communication: none at bedside Disposition Plan: pending clinical course   Consultants:  NS  Procedures:  none  Antibiotics:  none  HPI/Subjective: Still with increased back pain, states she cannot function with this degree of pain  Objective: Filed Vitals:   12/27/12 0648 12/27/12 1401 12/27/12 2227 12/28/12 0634  BP: 136/69 133/53 153/70 131/50  Pulse: 74 87 90 86  Temp: 97.9 F (36.6 C) 97.9 F (36.6 C) 98.3 F (36.8 C) 97.9 F (36.6 C)  TempSrc:   Oral Oral  Resp: 18 18 18 17   Height:      Weight:      SpO2: 100% 97% 93% 94%    Intake/Output Summary (Last 24 hours) at 12/28/12 1024 Last data filed at 12/28/12 0000  Gross per 24 hour  Intake   1100 ml  Output    250 ml  Net    850 ml   Filed Weights   12/26/12 2300  Weight: 68.2 kg (150 lb 5.7 oz)    Exam:   General:  Alert and orientedx3 in NAD  Cardiovascular: RRR, nl S1S2  Respiratory: CTAB  Abdomen: Soft +BS NT/ND  Extremities:No cyanosis and no edema  Data Reviewed: Basic Metabolic Panel:  Recent Labs Lab 12/26/12 1726 12/27/12 0505 12/28/12 0525  NA 137 140 136  K 3.4* 3.5 3.7  CL 102 104 100  CO2 25 28 25    GLUCOSE 117* 129* 111*  BUN 27* 19 14  CREATININE 0.58 0.64 0.45*  CALCIUM 9.4 8.6 8.2*   Liver Function Tests: No results found for this basename: AST, ALT, ALKPHOS, BILITOT, PROT, ALBUMIN,  in the last 168 hours No results found for this basename: LIPASE, AMYLASE,  in the last 168 hours No results found for this basename: AMMONIA,  in the last 168 hours CBC:  Recent Labs Lab 12/26/12 1726 12/27/12 0505 12/28/12 0525  WBC 16.6* 13.0* 13.5*  NEUTROABS 14.4*  --   --   HGB 14.0 12.5 13.3  HCT 40.2 37.6 39.9  MCV 94.8 95.7 95.2  PLT 477* 444* 409*   Cardiac Enzymes: No results found for this basename: CKTOTAL, CKMB, CKMBINDEX, TROPONINI,  in the last 168 hours BNP (last 3 results) No results found for this basename: PROBNP,  in the last 8760 hours CBG: No results found for this basename: GLUCAP,  in the last 168 hours  No results found for this or any previous visit (from the past 240 hour(s)).   Studies: Dg Thoracic Spine W/swimmers  12/27/2012   *RADIOLOGY REPORT*  Clinical Data: Evaluate scoliosis.  Back pain.  THORACIC SPINE - 2 VIEW + SWIMMERS  Comparison: None  Findings: There is diffuse osteopenia.  No fracture or malalignment in the thoracic spine. Slight wedged appearance of the L1 vertebral body.  See lumbar spine series and lumbar spine CT for further discussion.  Disc spaces are maintained.  IMPRESSION: No acute bony abnormality within the thoracic spine.  Diffuse osteopenia.  Slight wedged appearance of the L1 vertebral body.  See lumbar spine report.   Original Report Authenticated By: Charlett Nose, M.D.   Dg Lumbar Spine Complete W/bend  12/27/2012   *RADIOLOGY REPORT*  Clinical Data: Low back pain.  History of lumbar fusion.  LUMBAR SPINE - COMPLETE WITH BENDING VIEWS  Comparison: Lumbar spine CT performed today and MRI performed yesterday.  No remote prior studies.  Findings: The bones are diffusely demineralized.  The alignment is near anatomic.  There is a mild  superior endplate compression fracture at L1 resulting in approximately 15% loss of vertebral body height.  No other fractures are identified.  Patient is status post laminectomy and PLIF from L2-L4.  There is loosening of the L4 pedicle screws, better seen on today's CT.  The interbody spacer at L3-L4 is posteriorly displaced by 5 mm with respect to the posterior cortex of the adjacent vertebral bodies. The markers for the L3-L4 interbody spacer also project slightly posterior to the posterior cortex of the adjacent vertebral bodies.  Flexion and extension views demonstrate limited range of motion, but no gross instability.  IMPRESSION:  1.  Mild superior endplate compression deformity at L1. 2.  Prior laminectomy and PLIF from L2-L4.  There is posterior displacement of the interbody spacers, especially the more inferior one.  Some loosening of the L4 pedicle screws is more obvious on CT.  See separate report. 3.  No dynamic instability identified.   Original Report Authenticated By: Carey Bullocks, M.D.   Ct Lumbar Spine Wo Contrast  12/27/2012   *RADIOLOGY REPORT*  Clinical Data: Low back pain.  Evaluate lumbar fusion.  CT LUMBAR SPINE WITHOUT CONTRAST  Technique:  Multidetector CT imaging of the lumbar spine was performed without intravenous contrast administration. Multiplanar CT image reconstructions were also generated.  Comparison: Lumbar MRI 12/26/2012.  No remote prior studies available.  Findings: There are five lumbar type vertebral bodies.  The alignment is near anatomic. There is a mild superior endplate compression deformity at L1 associated with irregularity of the anterior cortex.  This is a subtle finding on yesterday's MRI, but is suspected to reflect an acute fracture.  No other fractures are identified.  The patient is status post laminectomy and PLIF from L2-L4.  There is some lucency surrounding the L4 pedicle screws, especially on the left.  No loosening of the other pedicle screws is  demonstrated.  The interbody spacer at L3-L4 creates significant artifact, but protrudes approximately 5 mm posterior to the posterior cortex of the adjacent vertebral bodies.  The L2-L3 interbody spacer also projects posterior to the dorsal cortex of the adjacent vertebral bodies.  L1-L2:  There is annular disc bulging with a broad-based foraminal extraforaminal disc protrusion on the right.  There is mild right lateral recess and right foraminal stenosis.  L2-L3:  Postsurgical changes as above.  Mild right foraminal stenosis.  L3-L4:  Postsurgical changes as above with posterior displacement of the interbody spacer.  L4-L5:  There is annular disc bulging eccentric to the left with mild facet and ligamentous hypertrophy.  Mild narrowing of the left lateral recess.  L5-S1:  Moderate facet hypertrophy.  No spinal stenosis or nerve root encroachment.  IMPRESSION:  1.  Mild superior endplate compression fracture at L1 has an appearance most consistent with an acute fracture.  The  posterior cortex is intact. 2.  Postsurgical changes status post L2-L4 laminectomy and PLIF. There is loosening of the L4 pedicle screws and posterior displacement of the interbody spacers, especially the L3-L4 spacer. 3.  Mild spondylosis at the additional levels as detailed above. No high-grade foraminal stenosis.   Original Report Authenticated By: Carey Bullocks, M.D.   Mr Lumbar Spine Wo Contrast  12/27/2012   **ADDENDUM** CREATED: 12/27/2012 20:31:22  Upon review of subsequent CT scan, it is more evident that a transverse fracture is present through the L1 vertebral body with slight loss of height.  This fracture does not involve the posterior elements.  The fracture is incompletely healed.  **END ADDENDUM** SIGNED BY: Chauncey Fischer, M.D.  12/26/2012   *RADIOLOGY REPORT*  Clinical Data: Weakness.  Lumbar disc disease.  Status post epidural injection 12/10/2012 with continued progression of symptoms.  Low back pain extending  into the left lower extremity.  MRI LUMBAR SPINE WITHOUT CONTRAST  Technique:  Multiplanar and multiecho pulse sequences of the lumbar spine were obtained without intravenous contrast.  Comparison: None.  Findings: Normal signal is present in the conus medullaris which terminates at T12-L1, within normal limits.  The patient is status post posterior fusion at L2-3 and L3-4.  Edematous end plate marrow changes are present on the right at L1-2.  Leftward curvature of the lumbar spine is centered at L2-3.  The vertebral body heights and AP alignment are normal.  Limited imaging of the abdomen is unremarkable.  A benign appearing fluid collection is evident to below the subcutaneous fat at the fascial margin.  The disc levels at T12 and above are normal.  L1-2:  A rightward disc herniation is present.  Mild facet hypertrophy is noted.  This results in mild right lateral recess and foraminal narrowing.  L2-3:  The disc spacer is at the posterior margin of the vertebral body.  Mild right lateral recess and foraminal narrowing is present.  L3-4:  Patient is status post laminectomy.  The central canal and foramina are widely patent.  L4-5:  A mild leftward disc herniation is present.  Mild left foraminal narrowing is evident.  L5-S1:  Moderate facet hypertrophy is worse on the right.  There is no significant stenosis.  IMPRESSION:  1.  Rightward disc herniation at L1-2 in concert with facet hypertrophy results in mild right lateral recess and foraminal narrowing. 2.  Mild right lateral recess and foraminal narrowing at L2-3 with slight posterior displacement of the disc spacer. 3.  Status post laminectomy at L3-4 without evidence for residual or recurrent stenosis. 4.  Mild left foraminal narrowing at L4-5. 5.  Asymmetric right-sided facet arthropathy at L5-S1 without significant stenosis.   Original Report Authenticated By: Marin Roberts, M.D.    Scheduled Meds: . enoxaparin (LOVENOX) injection  40 mg  Subcutaneous Daily  . nicotine  14 mg Transdermal Daily  . pregabalin  75 mg Oral TID   Continuous Infusions: . sodium chloride 100 mL/hr at 12/27/12 0123    Principal Problem:   Intractable low back pain Active Problems:   DDD (degenerative disc disease)   Rheumatoid arthritis   Hypertension    Time spent: 13    [facility]  [facility] Pager [phone]. If 7PM-7AM, please contact night-coverage at [email or web address], password [facility] 12/28/2012, 10:24 AM  LOS: 2 days

## 2012-12-28 NOTE — Evaluation (Signed)
Physical Therapy Evaluation Patient Details Name: Michelle Cabrera MRN: [id number] DOB: Nov 11, 1945 Today's Date: 12/28/2012 Time: 2956-2130 PT Time Calculation (min): 24 min  PT Assessment / Plan / Recommendation Clinical Impression  Pt is a 67 y.o. female adm to [facility] due to severe LBP. Presents with mobility and balance deficits due to increased LBP. Will benefit from skilled PT to maximzie functional mobility and ensure safe transition home with husband. Pt denies need for 3 in1 or other DME.     PT Assessment  Patient needs continued PT services    Follow Up Recommendations  Home health PT;Supervision/Assistance - 24 hour    Does the patient have the potential to tolerate intense rehabilitation      Barriers to Discharge None      Equipment Recommendations  None recommended by PT    Recommendations for Other Services OT consult   Frequency Min 5X/week    Precautions / Restrictions Precautions Precautions: Back;Fall Precaution Comments: fell 1 year ago; independently recalled 3/3 back precautions from previous surgeries   Restrictions Weight Bearing Restrictions: No   Pertinent Vitals/Pain 10/10 with amb; RN administered IV pain meds prior to therapy.      Mobility  Bed Mobility Bed Mobility: Supine to Sit;Sitting - Scoot to Edge of Bed Supine to Sit: 4: Min guard;HOB elevated;With rails Sitting - Scoot to Edge of Bed: 5: Supervision;With rail Details for Bed Mobility Assistance: pt demo good technique with log roll from previous back surgeries; requires increased time and (A) to intiate due to pain Transfers Transfers: Stand to Sit;Sit to Stand Sit to Stand: 4: Min assist;From elevated surface;From bed Stand to Sit: 4: Min assist;To chair/3-in-1;With armrests Details for Transfer Assistance: (A) for safety and verbal cues for safety with the RW and hand placement; requires increased time due to pain  Ambulation/Gait Ambulation/Gait Assistance: 4: Min  assist Ambulation Distance (Feet): 10 Feet Assistive device: Rolling walker Ambulation/Gait Assistance Details: limited by pain; verbal cues to stand erect; pt reports she feels as though she is going to fall backwards with amb; no LOB noted; requires (A) for safety and verbal cues for safety with RW  Gait Pattern: Step-through pattern;Trunk flexed;Narrow base of support Gait velocity: decreased; unable to increase due to pain  Stairs: No Wheelchair Mobility Wheelchair Mobility: No    Exercises     PT Diagnosis: Difficulty walking;Acute pain  PT Problem List: Decreased strength;Decreased range of motion;Decreased activity tolerance;Decreased balance;Decreased mobility;Pain PT Treatment Interventions: DME instruction;Stair training;Gait training;Functional mobility training;Therapeutic activities;Balance training;Neuromuscular re-education;Patient/family education   PT Goals Acute Rehab PT Goals PT Goal Formulation: With patient Time For Goal Achievement: 01/04/13 Potential to Achieve Goals: Good Pt will go Supine/Side to Sit: with modified independence PT Goal: Supine/Side to Sit - Progress: Goal set today Pt will go Sit to Supine/Side: with modified independence PT Goal: Sit to Supine/Side - Progress: Goal set today Pt will go Sit to Stand: with modified independence PT Goal: Sit to Stand - Progress: Goal set today Pt will go Stand to Sit: with modified independence PT Goal: Stand to Sit - Progress: Goal set today Pt will Ambulate: >150 feet;with modified independence;with least restrictive assistive device PT Goal: Ambulate - Progress: Goal set today Pt will Go Up / Down Stairs: 3-5 stairs;with rail(s);with modified independence PT Goal: Up/Down Stairs - Progress: Goal set today  Visit Information  Last PT Received On: 12/28/12 Assistance Needed: +1    Subjective Data  Subjective: pt reports that she hasnt been walking much since Friday  due to pain; describes pain as  "wrenching" pain when moving Patient Stated Goal: less back pain   Prior Functioning  Home Living Lives With: Spouse Available Help at Discharge: Family;Available 24 hours/day Type of Home: House Home Access: Stairs to enter [employer] of Steps: 3 Entrance Stairs-Rails: Can reach both Home Layout: One level Bathroom Shower/Tub: Walk-in shower;Door [employer] Toilet: Standard Bathroom Accessibility: Yes How Accessible: Accessible via walker Home Adaptive Equipment: Shower chair without back;Walker - rolling;Straight cane Prior Function Level of Independence: Independent Able to Take Stairs?: Yes Driving: Yes Vocation: Retired Musician: No difficulties Dominant Hand: Right    Cognition  Cognition Arousal/Alertness: Awake/alert Behavior During Therapy: WFL for tasks assessed/performed Overall Cognitive Status: Within Functional Limits for tasks assessed    Extremity/Trunk Assessment Right Lower Extremity Assessment RLE ROM/Strength/Tone: Deficits RLE ROM/Strength/Tone Deficits: knee 4/5; hip 3+/5 limited by pain  RLE Sensation: History of peripheral neuropathy Left Lower Extremity Assessment LLE ROM/Strength/Tone: Deficits LLE ROM/Strength/Tone Deficits: knee 4/5; hip 3/5 limited by pain  LLE Sensation: History of peripheral neuropathy Trunk Assessment Trunk Assessment: Normal   Balance Balance Balance Assessed: Yes Static Sitting Balance Static Sitting - Balance Support: Bilateral upper extremity supported;Feet supported Static Sitting - Level of Assistance: 5: Stand by assistance Static Sitting - Comment/# of Minutes: required continuous UE bracing; c/o pain with sitting EOB ~3 min  End of Session PT - End of Session Equipment Utilized During Treatment: Gait belt Activity Tolerance: Patient limited by pain Patient left: in chair;with call bell/phone within reach Nurse Communication: Mobility status  GP     Donell Sievert,  Alger [phone] 12/28/2012, 1:26 PM

## 2012-12-28 NOTE — Progress Notes (Signed)
Subjective: Patient complaining of pain from her toes up to her head, she does not localize her pain to any specific location. She does describe muscular spasms in her torso more the left than the right side. X-rays of the thoracic and lumbar spine, and CT of the lumbar spine were reviewed. Despite specifically requesting an assessment of the extent of fusion, the radiologist did not comment regarding the question. However in reviewing the images the patient does appear to have a solid fusion from L2-L4 particularly noted in the posterior elements on the right side. However the radiologist did note a mild L1 compression fracture, and the radiologist who read the patient's initial MRI scan and then did his report to describe a mild L1 compression fracture.  Objective: Vital signs in last 24 hours: Filed Vitals:   12/27/12 0648 12/27/12 1401 12/27/12 2227 12/28/12 0634  BP: 136/69 133/53 153/70 131/50  Pulse: 74 87 90 86  Temp: 97.9 F (36.6 C) 97.9 F (36.6 C) 98.3 F (36.8 C) 97.9 F (36.6 C)  TempSrc:   Oral Oral  Resp: 18 18 18 17   Height:      Weight:      SpO2: 100% 97% 93% 94%    Intake/Output from previous day: 06/01 0701 - 06/02 0700 In: 1460 [P.O.:960; I.V.:500] Out: 250 [Urine:250] Intake/Output this shift:    Physical Exam:  Awake alert fully oriented. Following commands, moving all 4 extremities well.  CBC  Recent Labs  12/27/12 0505 12/28/12 0525  WBC 13.0* 13.5*  HGB 12.5 13.3  HCT 37.6 39.9  PLT 444* 409*   BMET  Recent Labs  12/27/12 0505 12/28/12 0525  NA 140 136  K 3.5 3.7  CL 104 100  CO2 28 25  GLUCOSE 129* 111*  BUN 19 14  CREATININE 0.64 0.45*  CALCIUM 8.6 8.2*   ABG No results found for this basename: phart, pco2, pco2art, po2, po2art, hco3, tco2, acidbasedef, o2sat    Studies/Results: Dg Thoracic Spine W/swimmers  12/27/2012   *RADIOLOGY REPORT*  Clinical Data: Evaluate scoliosis.  Back pain.  THORACIC SPINE - 2 VIEW + SWIMMERS   Comparison: None  Findings: There is diffuse osteopenia.  No fracture or malalignment in the thoracic spine. Slight wedged appearance of the L1 vertebral body.  See lumbar spine series and lumbar spine CT for further discussion.  Disc spaces are maintained.  IMPRESSION: No acute bony abnormality within the thoracic spine.  Diffuse osteopenia.  Slight wedged appearance of the L1 vertebral body.  See lumbar spine report.   Original Report Authenticated By: Charlett Nose, M.D.   Dg Lumbar Spine Complete W/bend  12/27/2012   *RADIOLOGY REPORT*  Clinical Data: Low back pain.  History of lumbar fusion.  LUMBAR SPINE - COMPLETE WITH BENDING VIEWS  Comparison: Lumbar spine CT performed today and MRI performed yesterday.  No remote prior studies.  Findings: The bones are diffusely demineralized.  The alignment is near anatomic.  There is a mild superior endplate compression fracture at L1 resulting in approximately 15% loss of vertebral body height.  No other fractures are identified.  Patient is status post laminectomy and PLIF from L2-L4.  There is loosening of the L4 pedicle screws, better seen on today's CT.  The interbody spacer at L3-L4 is posteriorly displaced by 5 mm with respect to the posterior cortex of the adjacent vertebral bodies. The markers for the L3-L4 interbody spacer also project slightly posterior to the posterior cortex of the adjacent vertebral bodies.  Flexion  and extension views demonstrate limited range of motion, but no gross instability.  IMPRESSION:  1.  Mild superior endplate compression deformity at L1. 2.  Prior laminectomy and PLIF from L2-L4.  There is posterior displacement of the interbody spacers, especially the more inferior one.  Some loosening of the L4 pedicle screws is more obvious on CT.  See separate report. 3.  No dynamic instability identified.   Original Report Authenticated By: Carey Bullocks, M.D.   Ct Lumbar Spine Wo Contrast  12/27/2012   *RADIOLOGY REPORT*  Clinical Data:  Low back pain.  Evaluate lumbar fusion.  CT LUMBAR SPINE WITHOUT CONTRAST  Technique:  Multidetector CT imaging of the lumbar spine was performed without intravenous contrast administration. Multiplanar CT image reconstructions were also generated.  Comparison: Lumbar MRI 12/26/2012.  No remote prior studies available.  Findings: There are five lumbar type vertebral bodies.  The alignment is near anatomic. There is a mild superior endplate compression deformity at L1 associated with irregularity of the anterior cortex.  This is a subtle finding on yesterday's MRI, but is suspected to reflect an acute fracture.  No other fractures are identified.  The patient is status post laminectomy and PLIF from L2-L4.  There is some lucency surrounding the L4 pedicle screws, especially on the left.  No loosening of the other pedicle screws is demonstrated.  The interbody spacer at L3-L4 creates significant artifact, but protrudes approximately 5 mm posterior to the posterior cortex of the adjacent vertebral bodies.  The L2-L3 interbody spacer also projects posterior to the dorsal cortex of the adjacent vertebral bodies.  L1-L2:  There is annular disc bulging with a broad-based foraminal extraforaminal disc protrusion on the right.  There is mild right lateral recess and right foraminal stenosis.  L2-L3:  Postsurgical changes as above.  Mild right foraminal stenosis.  L3-L4:  Postsurgical changes as above with posterior displacement of the interbody spacer.  L4-L5:  There is annular disc bulging eccentric to the left with mild facet and ligamentous hypertrophy.  Mild narrowing of the left lateral recess.  L5-S1:  Moderate facet hypertrophy.  No spinal stenosis or nerve root encroachment.  IMPRESSION:  1.  Mild superior endplate compression fracture at L1 has an appearance most consistent with an acute fracture.  The posterior cortex is intact. 2.  Postsurgical changes status post L2-L4 laminectomy and PLIF. There is loosening of  the L4 pedicle screws and posterior displacement of the interbody spacers, especially the L3-L4 spacer. 3.  Mild spondylosis at the additional levels as detailed above. No high-grade foraminal stenosis.   Original Report Authenticated By: Carey Bullocks, M.D.   Mr Lumbar Spine Wo Contrast  12/27/2012   **ADDENDUM** CREATED: 12/27/2012 20:31:22  Upon review of subsequent CT scan, it is more evident that a transverse fracture is present through the L1 vertebral body with slight loss of height.  This fracture does not involve the posterior elements.  The fracture is incompletely healed.  **END ADDENDUM** SIGNED BY: Chauncey Fischer, M.D.  12/26/2012   *RADIOLOGY REPORT*  Clinical Data: Weakness.  Lumbar disc disease.  Status post epidural injection 12/10/2012 with continued progression of symptoms.  Low back pain extending into the left lower extremity.  MRI LUMBAR SPINE WITHOUT CONTRAST  Technique:  Multiplanar and multiecho pulse sequences of the lumbar spine were obtained without intravenous contrast.  Comparison: None.  Findings: Normal signal is present in the conus medullaris which terminates at T12-L1, within normal limits.  The patient is status post posterior  fusion at L2-3 and L3-4.  Edematous end plate marrow changes are present on the right at L1-2.  Leftward curvature of the lumbar spine is centered at L2-3.  The vertebral body heights and AP alignment are normal.  Limited imaging of the abdomen is unremarkable.  A benign appearing fluid collection is evident to below the subcutaneous fat at the fascial margin.  The disc levels at T12 and above are normal.  L1-2:  A rightward disc herniation is present.  Mild facet hypertrophy is noted.  This results in mild right lateral recess and foraminal narrowing.  L2-3:  The disc spacer is at the posterior margin of the vertebral body.  Mild right lateral recess and foraminal narrowing is present.  L3-4:  Patient is status post laminectomy.  The central canal  and foramina are widely patent.  L4-5:  A mild leftward disc herniation is present.  Mild left foraminal narrowing is evident.  L5-S1:  Moderate facet hypertrophy is worse on the right.  There is no significant stenosis.  IMPRESSION:  1.  Rightward disc herniation at L1-2 in concert with facet hypertrophy results in mild right lateral recess and foraminal narrowing. 2.  Mild right lateral recess and foraminal narrowing at L2-3 with slight posterior displacement of the disc spacer. 3.  Status post laminectomy at L3-4 without evidence for residual or recurrent stenosis. 4.  Mild left foraminal narrowing at L4-5. 5.  Asymmetric right-sided facet arthropathy at L5-S1 without significant stenosis.   Original Report Authenticated By: Marin Roberts, M.D.    Assessment/Plan: Patient with a long history of back problems and multiple previous surgeries by Dr. Ruthann Cancer, who appears now to have suffered a mild L1 compression fracture. May be the source of some of the exacerbation of her discomfort. I discussed the findings with the patient and have recommended immobilization in a TLSO that'll need to be donned and doffed in bed. I discussed with the nursing staff [facility]) that the patient and her husband will need to be instructed in the use of a TLSO. In order then placed to obtain a TLSO from [employer].  Hewitt Shorts, MD 12/28/2012, 2:35 PM

## 2012-12-29 LAB — CBC
Hemoglobin: 12.5 g/dL (ref 12.0–15.0)
MCH: 31.7 pg (ref 26.0–34.0)
MCHC: 33.3 g/dL (ref 30.0–36.0)
Platelets: 370 10*3/uL (ref 150–400)
RBC: 3.94 MIL/uL (ref 3.87–5.11)

## 2012-12-29 MED ORDER — DULOXETINE HCL 30 MG PO CPEP
30.0000 mg | ORAL_CAPSULE | Freq: Two times a day (BID) | ORAL | Status: DC
  Administered 2012-12-29 – 2013-01-05 (×14): 30 mg via ORAL
  Filled 2012-12-29 (×18): qty 1

## 2012-12-29 MED ORDER — OXYCODONE HCL ER 10 MG PO T12A
20.0000 mg | EXTENDED_RELEASE_TABLET | Freq: Two times a day (BID) | ORAL | Status: DC
  Administered 2012-12-29 – 2012-12-31 (×5): 20 mg via ORAL
  Filled 2012-12-29 (×5): qty 2

## 2012-12-29 NOTE — Progress Notes (Signed)
Physical Therapy Treatment Patient Details Name: Michelle Cabrera MRN: [id number] DOB: Dec 04, 1945 Today's Date: 12/29/2012 Time: 4098-1191 PT Time Calculation (min): 26 min  PT Assessment / Plan / Recommendation Comments on Treatment Session  Pt in pain throughout treatment session despite pain meds.  Pt with guarded movements, but able to increase ambulation distance today with TLSO brace on.    Follow Up Recommendations  Home health PT;Supervision/Assistance - 24 hour     Does the patient have the potential to tolerate intense rehabilitation     Barriers to Discharge        Equipment Recommendations  None recommended by PT    Recommendations for Other Services    Frequency Min 5X/week   Plan Discharge plan remains appropriate;Frequency remains appropriate    Precautions / Restrictions     Pertinent Vitals/Pain 10/10 back pain- nursing gave pain meds    Mobility  Bed Mobility Bed Mobility: Rolling Right;Rolling Left Rolling Right: 5: Supervision;With rail Rolling Left: 5: Supervision;With rail Supine to Sit: 5: Supervision;With rails Details for Bed Mobility Assistance: Cues to maintain precautions, especially twisting.  Donned TLSO in supine.  Educated on its use. Transfers Transfers: Sit to Stand;Stand to Sit Sit to Stand: From bed;From toilet Stand to Sit: 4: Min guard Details for Transfer Assistance: poor use of hands  Ambulation/Gait Ambulation/Gait Assistance: 4: Min guard Ambulation Distance (Feet): 60 Feet Assistive device: Rolling walker Ambulation/Gait Assistance Details: short step lengths due to 12/10 pain.  Cannot tell if TLSO is helping or not. Gait Pattern: Decreased step length - right;Decreased step length - left    Exercises     PT Diagnosis:    PT Problem List:   PT Treatment Interventions:     PT Goals Acute Rehab PT Goals PT Goal: Supine/Side to Sit - Progress: Progressing toward goal PT Goal: Sit to Supine/Side - Progress: Progressing  toward goal PT Goal: Sit to Stand - Progress: Progressing toward goal PT Goal: Stand to Sit - Progress: Progressing toward goal PT Goal: Ambulate - Progress: Progressing toward goal  Visit Information  Last PT Received On: 12/29/12 Assistance Needed: +1 PT/OT Co-Evaluation/Treatment: Yes    Subjective Data  Subjective: I have never had such pain in my life. Patient Stated Goal: decrease pain   Cognition  Cognition Arousal/Alertness: Awake/alert Behavior During Therapy: WFL for tasks assessed/performed Overall Cognitive Status: Within Functional Limits for tasks assessed    Balance     End of Session PT - End of Session Equipment Utilized During Treatment: Back brace Activity Tolerance: Patient limited by pain Patient left: in chair;with call bell/phone within reach Nurse Communication: Patient requests pain meds   GP     Robertine Kipper LUBECK 12/29/2012, 12:05 PM

## 2012-12-29 NOTE — Evaluation (Addendum)
Occupational Therapy Evaluation Patient Details Name: Michelle Cabrera MRN: [id number] DOB: [birth year] Today's Date: 12/29/2012 Time: 4098-1191 OT Time Calculation (min): 22 min  OT Assessment / Plan / Recommendation Clinical Impression    Pt is a 67 y.o. female adm to [facility] due to severe LBP. Presents with below problem list. Will benefit from skilled OT to maximize independence and ensure safe transition home with husband.     OT Assessment  Patient needs continued OT Services    Follow Up Recommendations  Home health OT;Supervision/Assistance - 24 hour    Barriers to Discharge      Equipment Recommendations       Recommendations for Other Services    Frequency  Min 2X/week    Precautions / Restrictions Precautions Precautions: Back;Fall Precaution Comments: fell 1 year ago; independently recalled 2/3 back precautions from previous surgeries   Required Braces or Orthoses: Spinal Brace Spinal Brace: Applied in supine position Restrictions Weight Bearing Restrictions: No   Pertinent Vitals/Pain 10/10 back pain-nursing gave pain meds     ADL  Eating/Feeding: Independent Where Assessed - Eating/Feeding: Chair Grooming: Set up Where Assessed - Grooming: Supported sitting Upper Body Bathing: Minimal assistance Where Assessed - Upper Body Bathing: Supported sitting Lower Body Bathing: Maximal assistance Where Assessed - Lower Body Bathing: Supported sit to stand Upper Body Dressing: Minimal assistance Where Assessed - Upper Body Dressing: Supported sitting Lower Body Dressing: Maximal assistance Where Assessed - Lower Body Dressing: Supported sit to Pharmacist, hospital: Performed Statistician Method: Sit to Barista: Raised toilet seat with arms (or 3-in-1 over toilet) Toileting - Clothing Manipulation and Hygiene: +1 Total assistance;Supervision/safety (supervision-hygiene and +1 total a- clothing) Where Assessed - Toileting Clothing  Manipulation and Hygiene: Sit to stand from 3-in-1 or toilet;Sit on 3-in-1 or toilet (hygiene-sitting/sit to stand and clothing-standing) Tub/Shower Transfer Method: Not assessed Equipment Used: Back brace;Rolling walker Transfers/Ambulation Related to ADLs: Minguard ADL Comments: Pt performed toilet transfer with minguard assist. Total A for clothing management as pt was voiding on floor. Pt performed hygiene sitting but with cues to maintain precautions. Had pt simulate reaching behind to perform hygiene and pt unable to fully reach. Pt verbalized that she does not want to work on dressing as her husband will help her get dressed.     OT Diagnosis: Acute pain  OT Problem List: Decreased strength;Decreased range of motion;Decreased activity tolerance;Impaired balance (sitting and/or standing);Decreased knowledge of precautions;Decreased safety awareness;Decreased knowledge of use of DME or AE;Pain OT Treatment Interventions: Self-care/ADL training;DME and/or AE instruction;Therapeutic activities;Patient/family education;Balance training   OT Goals Acute Rehab OT Goals OT Goal Formulation: With patient Time For Goal Achievement: 01/05/13 Potential to Achieve Goals: Good ADL Goals Pt Will Perform Grooming: with modified independence;Standing at sink ADL Goal: Grooming - Progress: Goal set today Pt Will Perform Upper Body Bathing: with modified independence;Sitting, chair ADL Goal: Upper Body Bathing - Progress: Goal set today Pt Will Perform Lower Body Bathing: with modified independence;Sit to stand from chair ADL Goal: Lower Body Bathing - Progress: Goal set today Pt Will Transfer to Toilet: with modified independence;Ambulation;with DME ADL Goal: Toilet Transfer - Progress: Goal set today Pt Will Perform Toileting - Clothing Manipulation: with modified independence;Standing ADL Goal: Toileting - Clothing Manipulation - Progress: Goal set today Pt Will Perform Toileting - Hygiene: with  modified independence;Sit to stand from 3-in-1/toilet;Sitting on 3-in-1 or toilet ADL Goal: Toileting - Hygiene - Progress: Goal set today Pt Will Perform Tub/Shower Transfer: Shower transfer;with modified independence;Ambulation ADL  Goal: Tub/Shower Transfer - Progress: Goal set today  Visit Information  Last OT Received On: 12/29/12 Assistance Needed: +1 PT/OT Co-Evaluation/Treatment: Yes    Subjective Data      Prior Functioning     Home Living Lives With: Spouse Available Help at Discharge: Family;Available 24 hours/day Type of Home: House Home Access: Stairs to enter [employer] of Steps: 3 Entrance Stairs-Rails: Can reach both Home Layout: One level Bathroom Shower/Tub: Walk-in shower;Door [employer] Toilet: Standard Bathroom Accessibility: Yes How Accessible: Accessible via walker Home Adaptive Equipment: Shower chair without back;Walker - rolling;Straight cane Prior Function Level of Independence: Independent Able to Take Stairs?: Yes Driving: Yes Vocation: Retired Musician: No difficulties Dominant Hand: Right         Vision/Perception     Cognition  Cognition Arousal/Alertness: Awake/alert Behavior During Therapy: WFL for tasks assessed/performed Overall Cognitive Status: Within Functional Limits for tasks assessed    Extremity/Trunk Assessment Right Upper Extremity Assessment RUE ROM/Strength/Tone: WFL for tasks assessed Left Upper Extremity Assessment LUE ROM/Strength/Tone: WFL for tasks assessed     Mobility Bed Mobility Bed Mobility: Rolling Right;Rolling Left;Left Sidelying to Sit Rolling Right: 5: Supervision;With rail Rolling Left: 5: Supervision;With rail Left Sidelying to Sit: 5: Supervision;With rails Supine to Sit: 5: Supervision;With rails Details for Bed Mobility Assistance: Cues to maintain precautions, especially twisting.  Donned TLSO in supine.  Educated on its use. Transfers Transfers: Sit to  Stand;Stand to Sit Sit to Stand: 4: Min guard;From elevated surface;From bed;From chair/3-in-1 Stand to Sit: 4: Min guard;To chair/3-in-1 Details for Transfer Assistance: poor use of hands      Exercise     Balance     End of Session OT - End of Session Equipment Utilized During Treatment: Back brace Activity Tolerance: Patient limited by pain Patient left: Other (comment) (with PT)  GO     Earlie Raveling OTR/L [phone] 12/29/2012, 1:21 PM

## 2012-12-29 NOTE — Progress Notes (Addendum)
[facility] PROGRESS NOTE  Michelle Cabrera YNW:[id number] DOB: 1945-11-20 DOA: 12/26/2012 PCP: Pcp Not In System  Assessment/Plan:  1. Intractable Low Back Pain - Due to DDD and Herniated Disk L1-L2 on MRI, CT also with possible acute L1compression fracture  And Postsurgical changes status post L2-L4 laminectomy and PLIF. There is loosening of the L4 pedicle screws and posterior  displacement of the interbody spacers, especially the L3-L4 spacer - Dr Newell Coral recommends TLSO -continue pain management>>add longacting meds(oxycontin) - PT/OT following 2. DDD- See #1.  3. RA- Continue Methotrexate Rx,  4. HTN- Continue, Monitor BPS, PRN IV hydralazine.  5. Tobacco Use Disorder- Smoking Cessation on admission, continue Nicotine Patch.     Code Status: FULL Family Communication: husband at bedside Disposition Plan: Home with PT/OT when pain better controlled   Consultants:  NS  Procedures:  none  Antibiotics:  none  HPI/Subjective: Still with increased back pain, but with TLSO was able to ambulate with PT and sit in chair  Objective: Filed Vitals:   12/28/12 0634 12/28/12 1400 12/28/12 2129 12/29/12 0603  BP: 131/50 133/56 148/59 144/72  Pulse: 86 96 95 100  Temp: 97.9 F (36.6 C) 99 F (37.2 C) 98.8 F (37.1 C) 98.9 F (37.2 C)  TempSrc: Oral     Resp: 17 16 16 16   Height:      Weight:      SpO2: 94% 93% 93% 94%    Intake/Output Summary (Last 24 hours) at 12/29/12 1357 Last data filed at 12/29/12 0910  Gross per 24 hour  Intake   3000 ml  Output    750 ml  Net   2250 ml   Filed Weights   12/26/12 2300  Weight: 68.2 kg (150 lb 5.7 oz)    Exam:   General:  Alert and orientedx3 in NAD  Cardiovascular: RRR, nl S1S2  Respiratory: CTAB  Abdomen: Soft +BS NT/ND  Extremities:No cyanosis and no edema  Data Reviewed: Basic Metabolic Panel:  Recent Labs Lab 12/26/12 1726 12/27/12 0505 12/28/12 0525  NA 137 140 136  K 3.4* 3.5 3.7  CL  102 104 100  CO2 25 28 25   GLUCOSE 117* 129* 111*  BUN 27* 19 14  CREATININE 0.58 0.64 0.45*  CALCIUM 9.4 8.6 8.2*   Liver Function Tests: No results found for this basename: AST, ALT, ALKPHOS, BILITOT, PROT, ALBUMIN,  in the last 168 hours No results found for this basename: LIPASE, AMYLASE,  in the last 168 hours No results found for this basename: AMMONIA,  in the last 168 hours CBC:  Recent Labs Lab 12/26/12 1726 12/27/12 0505 12/28/12 0525 12/29/12 0528  WBC 16.6* 13.0* 13.5* 9.8  NEUTROABS 14.4*  --   --   --   HGB 14.0 12.5 13.3 12.5  HCT 40.2 37.6 39.9 37.5  MCV 94.8 95.7 95.2 95.2  PLT 477* 444* 409* 370   Cardiac Enzymes: No results found for this basename: CKTOTAL, CKMB, CKMBINDEX, TROPONINI,  in the last 168 hours BNP (last 3 results) No results found for this basename: PROBNP,  in the last 8760 hours CBG: No results found for this basename: GLUCAP,  in the last 168 hours  No results found for this or any previous visit (from the past 240 hour(s)).   Studies: Dg Thoracic Spine W/swimmers  12/27/2012   *RADIOLOGY REPORT*  Clinical Data: Evaluate scoliosis.  Back pain.  THORACIC SPINE - 2 VIEW + SWIMMERS  Comparison: None  Findings: There is diffuse osteopenia.  No fracture or malalignment in the thoracic spine. Slight wedged appearance of the L1 vertebral body.  See lumbar spine series and lumbar spine CT for further discussion.  Disc spaces are maintained.  IMPRESSION: No acute bony abnormality within the thoracic spine.  Diffuse osteopenia.  Slight wedged appearance of the L1 vertebral body.  See lumbar spine report.   Original Report Authenticated By: Charlett Nose, M.D.   Dg Lumbar Spine Complete W/bend  12/27/2012   *RADIOLOGY REPORT*  Clinical Data: Low back pain.  History of lumbar fusion.  LUMBAR SPINE - COMPLETE WITH BENDING VIEWS  Comparison: Lumbar spine CT performed today and MRI performed yesterday.  No remote prior studies.  Findings: The bones are  diffusely demineralized.  The alignment is near anatomic.  There is a mild superior endplate compression fracture at L1 resulting in approximately 15% loss of vertebral body height.  No other fractures are identified.  Patient is status post laminectomy and PLIF from L2-L4.  There is loosening of the L4 pedicle screws, better seen on today's CT.  The interbody spacer at L3-L4 is posteriorly displaced by 5 mm with respect to the posterior cortex of the adjacent vertebral bodies. The markers for the L3-L4 interbody spacer also project slightly posterior to the posterior cortex of the adjacent vertebral bodies.  Flexion and extension views demonstrate limited range of motion, but no gross instability.  IMPRESSION:  1.  Mild superior endplate compression deformity at L1. 2.  Prior laminectomy and PLIF from L2-L4.  There is posterior displacement of the interbody spacers, especially the more inferior one.  Some loosening of the L4 pedicle screws is more obvious on CT.  See separate report. 3.  No dynamic instability identified.   Original Report Authenticated By: Carey Bullocks, M.D.   Ct Lumbar Spine Wo Contrast  12/27/2012   *RADIOLOGY REPORT*  Clinical Data: Low back pain.  Evaluate lumbar fusion.  CT LUMBAR SPINE WITHOUT CONTRAST  Technique:  Multidetector CT imaging of the lumbar spine was performed without intravenous contrast administration. Multiplanar CT image reconstructions were also generated.  Comparison: Lumbar MRI 12/26/2012.  No remote prior studies available.  Findings: There are five lumbar type vertebral bodies.  The alignment is near anatomic. There is a mild superior endplate compression deformity at L1 associated with irregularity of the anterior cortex.  This is a subtle finding on yesterday's MRI, but is suspected to reflect an acute fracture.  No other fractures are identified.  The patient is status post laminectomy and PLIF from L2-L4.  There is some lucency surrounding the L4 pedicle screws,  especially on the left.  No loosening of the other pedicle screws is demonstrated.  The interbody spacer at L3-L4 creates significant artifact, but protrudes approximately 5 mm posterior to the posterior cortex of the adjacent vertebral bodies.  The L2-L3 interbody spacer also projects posterior to the dorsal cortex of the adjacent vertebral bodies.  L1-L2:  There is annular disc bulging with a broad-based foraminal extraforaminal disc protrusion on the right.  There is mild right lateral recess and right foraminal stenosis.  L2-L3:  Postsurgical changes as above.  Mild right foraminal stenosis.  L3-L4:  Postsurgical changes as above with posterior displacement of the interbody spacer.  L4-L5:  There is annular disc bulging eccentric to the left with mild facet and ligamentous hypertrophy.  Mild narrowing of the left lateral recess.  L5-S1:  Moderate facet hypertrophy.  No spinal stenosis or nerve root encroachment.  IMPRESSION:  1.  Mild  superior endplate compression fracture at L1 has an appearance most consistent with an acute fracture.  The posterior cortex is intact. 2.  Postsurgical changes status post L2-L4 laminectomy and PLIF. There is loosening of the L4 pedicle screws and posterior displacement of the interbody spacers, especially the L3-L4 spacer. 3.  Mild spondylosis at the additional levels as detailed above. No high-grade foraminal stenosis.   Original Report Authenticated By: Carey Bullocks, M.D.    Scheduled Meds: . enoxaparin (LOVENOX) injection  40 mg Subcutaneous Daily  . esomeprazole  40 mg Oral QAC breakfast  . nicotine  14 mg Transdermal Daily  . pregabalin  75 mg Oral TID   Continuous Infusions: . sodium chloride 100 mL/hr at 12/29/12 2130    Principal Problem:   Intractable low back pain Active Problems:   DDD (degenerative disc disease)   Rheumatoid arthritis   Hypertension    Time spent: 46    Decker Cogdell C  [facility] Pager [phone]. If 7PM-7AM,  please contact night-coverage at [email or web address], password [facility] 12/29/2012, 1:57 PM  LOS: 3 days

## 2012-12-30 ENCOUNTER — Inpatient Hospital Stay (HOSPITAL_COMMUNITY): Payer: MEDICARE

## 2012-12-30 DIAGNOSIS — M549 Dorsalgia, unspecified: Secondary | ICD-10-CM

## 2012-12-30 MED ORDER — OXYCODONE-ACETAMINOPHEN 5-325 MG PO TABS
1.0000 | ORAL_TABLET | ORAL | Status: DC | PRN
  Administered 2012-12-30 – 2013-01-03 (×14): 1 via ORAL
  Filled 2012-12-30 (×14): qty 1

## 2012-12-30 MED ORDER — SENNOSIDES-DOCUSATE SODIUM 8.6-50 MG PO TABS
2.0000 | ORAL_TABLET | Freq: Every morning | ORAL | Status: DC
  Administered 2012-12-30 – 2012-12-31 (×2): 2 via ORAL
  Filled 2012-12-30 (×2): qty 2

## 2012-12-30 MED ORDER — OXYCODONE HCL 5 MG PO TABS
5.0000 mg | ORAL_TABLET | ORAL | Status: DC | PRN
  Administered 2012-12-30 – 2013-01-03 (×13): 5 mg via ORAL
  Filled 2012-12-30 (×15): qty 1

## 2012-12-30 MED ORDER — IBUPROFEN 400 MG PO TABS
400.0000 mg | ORAL_TABLET | Freq: Three times a day (TID) | ORAL | Status: DC
  Administered 2012-12-30 – 2013-01-05 (×17): 400 mg via ORAL
  Filled 2012-12-30 (×20): qty 1

## 2012-12-30 MED ORDER — SENNOSIDES-DOCUSATE SODIUM 8.6-50 MG PO TABS: 1.0000 | ORAL_TABLET | Freq: Every morning | ORAL | Status: DC

## 2012-12-30 NOTE — Progress Notes (Signed)
[facility] PROGRESS NOTE  Michelle Cabrera ZOX:[id number] DOB: Jul 29, 1946 DOA: 12/26/2012 PCP: Pcp Not In System  Assessment/Plan:  1. Intractable Low Back Pain - Due to DDD and Herniated Disk L1-L2 on MRI, CT also with possible acute L1compression fracture and Postsurgical changes status post L2-L4 laminectomy and PLIF. There is loosening of the L4 pedicle screws and posterior  displacement of the interbody spacers, especially the L3-L4 spacer - Dr Newell Coral recommends TLSO -oxycontin 20 mg po BID started 12/29/12 , with percocet 10/325 for breaktrhough pain . Ibuprofen 400 mg TID added for adjuvant pain control.  - PT/OT following 2. RA- Continue Methotrexate Rx,  3. HTN- Continue, Monitor BPS, PRN IV hydralazine.  4. Tobacco Use Disorder- Smoking Cessation on admission, continue Nicotine Patch.     Code Status: FULL Family Communication: patient  Disposition Plan: Home with PT/OT when pain better controlled   Consultants:  NS  Procedures:  none  Antibiotics:  none  HPI/Subjective: Still with increased back pain, but with TLSO was able to ambulate with PT and sit in chair  Objective: Filed Vitals:   12/29/12 0603 12/29/12 1359 12/29/12 2100 12/30/12 0604  BP: 144/72 148/54 138/70 141/73  Pulse: 100 100 102 93  Temp: 98.9 F (37.2 C) 99.3 F (37.4 C) 98.5 F (36.9 C) 97.6 F (36.4 C)  TempSrc:      Resp: 16 18 16 16   Height:      Weight:      SpO2: 94% 96% 92% 92%    Intake/Output Summary (Last 24 hours) at 12/30/12 1249 Last data filed at 12/30/12 1216  Gross per 24 hour  Intake    240 ml  Output      0 ml  Net    240 ml   Filed Weights   12/26/12 2300  Weight: 68.2 kg (150 lb 5.7 oz)    Exam:   General:  Alert and orientedx3 in NAD  Cardiovascular: RRR, nl S1S2  Respiratory: CTAB  Abdomen: Soft +BS NT/ND  Extremities:No cyanosis and no edema  Data Reviewed: Basic Metabolic Panel:  Recent Labs Lab 12/26/12 1726 12/27/12 0505  12/28/12 0525  NA 137 140 136  K 3.4* 3.5 3.7  CL 102 104 100  CO2 25 28 25   GLUCOSE 117* 129* 111*  BUN 27* 19 14  CREATININE 0.58 0.64 0.45*  CALCIUM 9.4 8.6 8.2*   Liver Function Tests: No results found for this basename: AST, ALT, ALKPHOS, BILITOT, PROT, ALBUMIN,  in the last 168 hours No results found for this basename: LIPASE, AMYLASE,  in the last 168 hours No results found for this basename: AMMONIA,  in the last 168 hours CBC:  Recent Labs Lab 12/26/12 1726 12/27/12 0505 12/28/12 0525 12/29/12 0528  WBC 16.6* 13.0* 13.5* 9.8  NEUTROABS 14.4*  --   --   --   HGB 14.0 12.5 13.3 12.5  HCT 40.2 37.6 39.9 37.5  MCV 94.8 95.7 95.2 95.2  PLT 477* 444* 409* 370   Cardiac Enzymes: No results found for this basename: CKTOTAL, CKMB, CKMBINDEX, TROPONINI,  in the last 168 hours BNP (last 3 results) No results found for this basename: PROBNP,  in the last 8760 hours CBG: No results found for this basename: GLUCAP,  in the last 168 hours  No results found for this or any previous visit (from the past 240 hour(s)).   Studies: No results found.  Scheduled Meds: . DULoxetine  30 mg Oral BID  . enoxaparin (LOVENOX) injection  40 mg Subcutaneous Daily  . esomeprazole  40 mg Oral QAC breakfast  . ibuprofen  400 mg Oral TID  . nicotine  14 mg Transdermal Daily  . OxyCODONE  20 mg Oral Q12H  . pregabalin  75 mg Oral TID   Continuous Infusions:    Principal Problem:   Intractable low back pain Active Problems:   DDD (degenerative disc disease)   Rheumatoid arthritis   Hypertension     Michelle Cabrera  [facility] Pager [phone]. If 7PM-7AM, please contact night-coverage at [email or web address], password [facility] 12/30/2012, 12:49 PM  LOS: 4 days

## 2012-12-30 NOTE — Progress Notes (Signed)
Subjective: Patient complaining of abdominal pain, denies fever. Ambulated a short distance once yesterday, and a somewhat longer distance once today. Notes that staff has been instructing the patient and her husband in a technique of donning and doffing her brace in bed.  Objective: Vital signs in last 24 hours: Filed Vitals:   12/29/12 1359 12/29/12 2100 12/30/12 0604 12/30/12 1331  BP: 148/54 138/70 141/73 129/61  Pulse: 100 102 93 89  Temp: 99.3 F (37.4 C) 98.5 F (36.9 C) 97.6 F (36.4 C) 98.4 F (36.9 C)  TempSrc:    Oral  Resp: 18 16 16 18   Height:      Weight:      SpO2: 96% 92% 92% 93%    Intake/Output from previous day: 06/03 0701 - 06/04 0700 In: -  Out: 300 [Urine:300] Intake/Output this shift: Total I/O In: 240 [P.O.:240] Out: -   Physical Exam:  Abdomen distended, somewhat firm. Moving lower extremities well with good strength.  CBC  Recent Labs  12/28/12 0525 12/29/12 0528  WBC 13.5* 9.8  HGB 13.3 12.5  HCT 39.9 37.5  PLT 409* 370   BMET  Recent Labs  12/28/12 0525  NA 136  K 3.7  CL 100  CO2 25  GLUCOSE 111*  BUN 14  CREATININE 0.45*  CALCIUM 8.2*    Assessment/Plan: Making slow progress in terms of mobility. Continuing PT and OT. Suspect constipation, discussed with the patient's nurse Nettie Elm), and recommended contacting [facility] for evaluation and treatment.  May need to try and cut back on narcotic analgesics, so as to reduce the constipating effect.  Encouraged patient to steadily increase ambulation in the halls, trying to increase up to ambulating at least 3-4 times per day. This will help the back discomfort and also her constipation. Spoke with nursing staff about increasing ambulation in halls as well.  We'll check AP and lateral upright lumbar spine x-rays to assess fracture.  Hewitt Shorts, MD 12/30/2012, 3:07 PM

## 2012-12-30 NOTE — Progress Notes (Signed)
Physical Therapy Treatment Patient Details Name: Michelle Cabrera MRN: [id number] DOB: 1946-03-30 Today's Date: 12/30/2012 Time: 4098-1191 PT Time Calculation (min): 28 min  PT Assessment / Plan / Recommendation Comments on Treatment Session  Pt with decreased pain after ambulation beginning at 10/10 and decreasing to 7/10.  Pt with decrease guarded movements and able to increase ambulation to 140' with RW.  Husband unable to come in for TLSO training today  per pt, but she showed it to him last night and she feels they will be fine with donning it.  Pt provided education on donning. re-adjusting,etc.    Follow Up Recommendations  Home health PT;Supervision/Assistance - 24 hour     Does the patient have the potential to tolerate intense rehabilitation     Barriers to Discharge        Equipment Recommendations  None recommended by PT    Recommendations for Other Services    Frequency Min 5X/week   Plan Discharge plan remains appropriate;Frequency remains appropriate    Precautions / Restrictions Precautions Precautions: Back;Fall Spinal Brace: Applied in supine position   Pertinent Vitals/Pain 7/10 back pain after ambulation, 10/10 prior    Mobility  Bed Mobility Bed Mobility: Rolling Right;Rolling Left Rolling Right: 6: Modified independent (Device/Increase time);With rail Rolling Left: 6: Modified independent (Device/Increase time);With rail Left Sidelying to Sit: 6: Modified independent (Device/Increase time);With rails Sitting - Scoot to Edge of Bed: 6: Modified independent (Device/Increase time) Details for Bed Mobility Assistance: cues to maintain precautions.  Pt tends to do small twists in supine prior to TLSo being donned. Transfers Transfers: Sit to Stand;Stand to Sit Sit to Stand: From toilet;From bed;5: Supervision Stand to Sit: 4: Min guard Ambulation/Gait Ambulation/Gait Assistance: 4: Min guard Ambulation Distance (Feet): 140 Feet Assistive device: Rolling  walker Gait Pattern: Decreased step length - right;Decreased step length - left    Exercises     PT Diagnosis:    PT Problem List:   PT Treatment Interventions:     PT Goals Acute Rehab PT Goals PT Goal: Supine/Side to Sit - Progress: Met PT Goal: Sit to Supine/Side - Progress: Met PT Goal: Sit to Stand - Progress: Progressing toward goal PT Goal: Stand to Sit - Progress: Progressing toward goal PT Goal: Ambulate - Progress: Progressing toward goal  Visit Information  Assistance Needed: +1    Subjective Data  Subjective: "I guess my legs feel a little stronger." Patient Stated Goal: decrease pain   Cognition  Cognition Arousal/Alertness: Awake/alert Behavior During Therapy: WFL for tasks assessed/performed Overall Cognitive Status: Within Functional Limits for tasks assessed    Balance     End of Session PT - End of Session Equipment Utilized During Treatment: Back brace Activity Tolerance: Patient tolerated treatment well Patient left: in chair;with call bell/phone within reach   GP     [id number] 12/30/2012, 10:54 AM

## 2012-12-31 MED ORDER — DOCUSATE SODIUM 100 MG PO CAPS
100.0000 mg | ORAL_CAPSULE | Freq: Two times a day (BID) | ORAL | Status: DC
  Administered 2012-12-31 – 2013-01-04 (×9): 100 mg via ORAL
  Filled 2012-12-31 (×13): qty 1

## 2012-12-31 MED ORDER — OXYCODONE HCL ER 10 MG PO T12A
20.0000 mg | EXTENDED_RELEASE_TABLET | Freq: Three times a day (TID) | ORAL | Status: DC
  Administered 2012-12-31 – 2013-01-02 (×6): 20 mg via ORAL
  Filled 2012-12-31 (×6): qty 2

## 2012-12-31 MED ORDER — MAGNESIUM CITRATE PO SOLN
1.0000 | Freq: Once | ORAL | Status: AC
  Administered 2012-12-31: 1 via ORAL
  Filled 2012-12-31: qty 296

## 2012-12-31 MED ORDER — SENNOSIDES-DOCUSATE SODIUM 8.6-50 MG PO TABS
2.0000 | ORAL_TABLET | Freq: Two times a day (BID) | ORAL | Status: DC
  Administered 2012-12-31 – 2013-01-04 (×8): 2 via ORAL
  Filled 2012-12-31 (×5): qty 2
  Filled 2012-12-31: qty 1
  Filled 2012-12-31 (×3): qty 2

## 2012-12-31 NOTE — Progress Notes (Signed)
Occupational Therapy Treatment Patient Details Name: Michelle Cabrera MRN: [id number] DOB: 07-17-1946 Today's Date: 12/31/2012 Time: 0865-7846 OT Time Calculation (min): 35 min  OT Assessment / Plan / Recommendation Comments on Treatment Session  Pt progressing towards goals. Pt requiring several cues to maintain precautions. Pt practiced simulated shower transfer and was impulsive. Also performed grooming at sink and toileting.     Follow Up Recommendations  Home health OT;Supervision/Assistance - 24 hour    Barriers to Discharge       Equipment Recommendations  3 in 1 bedside comode    Recommendations for Other Services    Frequency Min 2X/week   Plan Discharge plan remains appropriate    Precautions / Restrictions Precautions Precautions: Back;Fall Precaution Comments: able to recall 2/3 precautions Required Braces or Orthoses: Spinal Brace Spinal Brace: Applied in supine position;Thoracolumbosacral orthotic Restrictions Weight Bearing Restrictions: No   Pertinent Vitals/Pain Pain 4/10. Pt reported she had been premedicated.     ADL  Grooming: Performed;Wash/dry hands;Supervision/safety Where Assessed - Grooming: Supported standing Toilet Transfer: Min Armed forces logistics/support/administrative officer Method: Sit to Barista: Raised toilet seat with arms (or 3-in-1 over toilet) Toileting - Clothing Manipulation and Hygiene: Minimal assistance Where Assessed - Glass blower/designer Manipulation and Hygiene: Standing;Sit on 3-in-1 or toilet (clothing-standing and hygiene-sitting) Tub/Shower Transfer: Simulated;Minimal assistance;Moderate assistance Tub/Shower Transfer Method: Science writer: Walk in shower;Other (comment) (3 in 1) Equipment Used: Gait belt;Back brace;Rolling walker Transfers/Ambulation Related to ADLs: Minguard. Cues to avoid twisting when making turns ADL Comments: Pt practiced simulated shower transfer and pt impulsive  requiring Min/Mod A for cues, and maneuvering walker, and sequencing. Pt also performed toilet transfer and required cues for precautions as she was performing hygiene. Educated pt on using toilet aid to assist with this task.     OT Diagnosis:    OT Problem List:   OT Treatment Interventions:     OT Goals Acute Rehab OT Goals OT Goal Formulation: With patient Time For Goal Achievement: 01/05/13 Potential to Achieve Goals: Good ADL Goals Pt Will Perform Grooming: with modified independence;Standing at sink ADL Goal: Grooming - Progress: Progressing toward goals Pt Will Perform Upper Body Bathing: with modified independence;Sitting, chair Pt Will Perform Lower Body Bathing: with modified independence;Sit to stand from chair Pt Will Transfer to Toilet: with modified independence;Ambulation;with DME ADL Goal: Toilet Transfer - Progress: Progressing toward goals Pt Will Perform Toileting - Clothing Manipulation: with modified independence;Standing ADL Goal: Toileting - Clothing Manipulation - Progress: Progressing toward goals Pt Will Perform Toileting - Hygiene: with modified independence;Sit to stand from 3-in-1/toilet;Sitting on 3-in-1 or toilet ADL Goal: Toileting - Hygiene - Progress: Progressing toward goals Pt Will Perform Tub/Shower Transfer: Shower transfer;with modified independence;Ambulation ADL Goal: Tub/Shower Transfer - Progress: Progressing toward goals  Visit Information  Last OT Received On: 12/30/12 Assistance Needed: +1    Subjective Data   .    Prior Functioning       Cognition  Cognition Arousal/Alertness: Awake/alert Behavior During Therapy: Impulsive Overall Cognitive Status: Within Functional Limits for tasks assessed    Mobility  Bed Mobility Bed Mobility: Rolling Right;Rolling Left;Left Sidelying to Sit;Sitting - Scoot to Edge of Bed Rolling Right: 5: Supervision Rolling Left: 5: Supervision Left Sidelying to Sit: 5: Supervision Sitting - Scoot to  Edge of Bed: 5: Supervision Details for Bed Mobility Assistance: Supervision for rolling and cues (verbal/tactile) needed for maintaining precautions (to avoid twisting).  Transfers Transfers: Sit to Stand;Stand to Sit Sit to Stand: From bed;From chair/3-in-1;4:  Min guard Stand to Sit: 4: Min guard;To chair/3-in-1 Details for Transfer Assistance: cues for hand placement        Balance     End of Session OT - End of Session Equipment Utilized During Treatment: Gait belt;Back brace Activity Tolerance: Patient tolerated treatment well Patient left: in chair;with call bell/phone within reach  GO     Earlie Raveling OTR/L [phone] 12/31/2012, 3:39 PM

## 2012-12-31 NOTE — Progress Notes (Signed)
[facility] PROGRESS NOTE  Michelle Cabrera WUJ:[id number] DOB: [birth year] DOA: 12/26/2012 PCP: Pcp Not In System  Assessment/Plan:  1. Intractable Low Back Pain - Due to DDD and Herniated Disk L1-L2 on MRI, CT also with possible acute L1compression fracture and Postsurgical changes status post L2-L4 laminectomy and PLIF. There is loosening of the L4 pedicle screws and posterior  displacement of the interbody spacers, especially the L3-L4 spacer - Dr Newell Coral recommended TLSO -oxycontin 20 mg po BID started 12/29/12 , with percocet 10/325 for breakthrough pain . Ibuprofen 400 mg TID added for adjuvant pain control on 12/30/12 By 12/31/12 patient c/o "worse back pain of my life " - increased the oxycontin to 20 mg every 8 hours. On 12/31/12 - PT/OT following 2. RA- Continue Methotrexate Rx,  3. HTN- Continue, Monitor BPS, PRN IV hydralazine.  4. Tobacco Use Disorder- Smoking Cessation on admission, continue Nicotine Patch.  5. Constipation - placed on senna and colace as well as mag citrate     Code Status: FULL Family Communication: patient  Disposition Plan: Home   Consultants:  NS  Procedures:  none  Antibiotics:  none  HPI/Subjective: Still with severe back pain, but with TLSO was able to ambulate with PT and sit in chair  Objective: Filed Vitals:   12/30/12 0604 12/30/12 1331 12/30/12 2047 12/31/12 0602  BP: 141/73 129/61 117/53 115/65  Pulse: 93 89 89 91  Temp: 97.6 F (36.4 C) 98.4 F (36.9 C) 98.5 F (36.9 C) 97.7 F (36.5 C)  TempSrc:  Oral    Resp: 16 18 16 16   Height:      Weight:      SpO2: 92% 93% 94% 92%    Intake/Output Summary (Last 24 hours) at 12/31/12 1044 Last data filed at 12/30/12 1813  Gross per 24 hour  Intake    480 ml  Output      0 ml  Net    480 ml   Filed Weights   12/26/12 2300  Weight: 68.2 kg (150 lb 5.7 oz)    Exam:   General:  Alert and orientedx3 in NAD  Cardiovascular: RRR, nl S1S2  Respiratory:  CTAB  Abdomen: Soft +BS NT/ND  Extremities:No cyanosis and no edema  Data Reviewed: Basic Metabolic Panel:  Recent Labs Lab 12/26/12 1726 12/27/12 0505 12/28/12 0525  NA 137 140 136  K 3.4* 3.5 3.7  CL 102 104 100  CO2 25 28 25   GLUCOSE 117* 129* 111*  BUN 27* 19 14  CREATININE 0.58 0.64 0.45*  CALCIUM 9.4 8.6 8.2*   Liver Function Tests: No results found for this basename: AST, ALT, ALKPHOS, BILITOT, PROT, ALBUMIN,  in the last 168 hours No results found for this basename: LIPASE, AMYLASE,  in the last 168 hours No results found for this basename: AMMONIA,  in the last 168 hours CBC:  Recent Labs Lab 12/26/12 1726 12/27/12 0505 12/28/12 0525 12/29/12 0528  WBC 16.6* 13.0* 13.5* 9.8  NEUTROABS 14.4*  --   --   --   HGB 14.0 12.5 13.3 12.5  HCT 40.2 37.6 39.9 37.5  MCV 94.8 95.7 95.2 95.2  PLT 477* 444* 409* 370   Cardiac Enzymes: No results found for this basename: CKTOTAL, CKMB, CKMBINDEX, TROPONINI,  in the last 168 hours BNP (last 3 results) No results found for this basename: PROBNP,  in the last 8760 hours CBG: No results found for this basename: GLUCAP,  in the last 168 hours  No results found for  this or any previous visit (from the past 240 hour(s)).   Studies: Dg Lumbar Spine 2-3 Views  12/30/2012   *RADIOLOGY REPORT*  Clinical Data: Assess L1 fracture.  LUMBAR SPINE - 2-3 VIEW  Comparison: 12/27/2012. CT lumbar spine 12/27/2012.  Findings: L1 superior endplate compression fracture appears minimally progressive, now measuring 2.1 cm in height anteriorly versus 2.6 cm on 12/27/2012.  Alignment is anatomic.  L2-4 posterior lumbar interbody fusion is unchanged. Posterior displacement of the L3-4 interbody spacer is again noted.  Posterior displacement of the L2-3 interbody spacer and loosening of the L4 pedicle screws are better evaluated on CT examination performed 3 days ago.  Facet hypertrophy L5-S1.  IMPRESSION:  1.  Slight progression in L1 superior  endplate compression fracture. 2.  L2-4 PLIF, as above, better evaluated on cross-sectional imaging performed 12/27/2012.   Original Report Authenticated By: Leanna Battles, M.D.    Scheduled Meds: . docusate sodium  100 mg Oral BID  . DULoxetine  30 mg Oral BID  . enoxaparin (LOVENOX) injection  40 mg Subcutaneous Daily  . esomeprazole  40 mg Oral QAC breakfast  . ibuprofen  400 mg Oral TID  . nicotine  14 mg Transdermal Daily  . OxyCODONE  20 mg Oral Q8H  . pregabalin  75 mg Oral TID  . senna-docusate  2 tablet Oral BID   Continuous Infusions:    Principal Problem:   Intractable low back pain Active Problems:   DDD (degenerative disc disease)   Rheumatoid arthritis   Hypertension     Michelle Cabrera  [facility] Pager [phone]. If 7PM-7AM, please contact night-coverage at [email or web address], password [facility] 12/31/2012, 10:44 AM  LOS: 5 days

## 2012-12-31 NOTE — Progress Notes (Signed)
Physical Therapy Treatment Patient Details Name: Michelle Cabrera MRN: [id number] DOB: [birth year] Today's Date: 12/31/2012 Time: 4098-1191 PT Time Calculation (min): 28 min  PT Assessment / Plan / Recommendation Comments on Treatment Session  Pt able to ambulate despite 10/10 pain, pt mobilizes with more ease with increased mobility. Discussed steps to enter home, pt reports that husband has been doing a wheelchair bump up prior to admission, discussed with pt that practice of stairs would be beneficial to protect pt's husband's back however pt does not feel up to it today.    Follow Up Recommendations  Home health PT;Supervision/Assistance - 24 hour        Barriers to Discharge  steps to enter      Equipment Recommendations  None recommended by PT       Frequency Min 5X/week   Plan Discharge plan remains appropriate;Frequency remains appropriate    Precautions / Restrictions Precautions Precautions: Back;Fall Precaution Comments: able to recall 3/3 back precautions without cues today Required Braces or Orthoses: Spinal Brace Spinal Brace: Applied in supine position;Thoracolumbosacral orthotic   Pertinent Vitals/Pain 10/10 pain in back, had been premedicated, pain relieved somewhat by mobility    Mobility  Bed Mobility Bed Mobility: Rolling Right;Rolling Left Rolling Right: 5: Supervision Rolling Left: 5: Supervision Left Sidelying to Sit: 5: Supervision Sitting - Scoot to Edge of Bed: 6: Modified independent (Device/Increase time) Details for Bed Mobility Assistance: Supervision for rolling without rails, cues needed for maintaining precautions Transfers Transfers: Sit to Stand;Stand to Sit Sit to Stand: From toilet;From bed;5: Supervision Stand to Sit: 4: Min guard Details for Transfer Assistance: Cues for hand placement Ambulation/Gait Ambulation/Gait Assistance: 4: Min guard Ambulation Distance (Feet): 120 Feet Assistive device: Rolling walker Ambulation/Gait  Assistance Details: Decreased step length however pt able to increase with cues. Focused on pain throughout mobility.  Gait Pattern: Decreased step length - right;Decreased step length - left Stairs: No (pt refused, reports husband bumps  up up/down with wheelchai)    Exercises General Exercises - Lower Extremity Mini-Sqauts: AROM;Both;10 reps    PT Goals Acute Rehab PT Goals PT Goal: Supine/Side to Sit - Progress: Progressing toward goal (needs  supervision without rails) PT Goal: Sit to Supine/Side - Progress: Progressing toward goal (needs supervision without rails) PT Goal: Sit to Stand - Progress: Progressing toward goal PT Goal: Stand to Sit - Progress: Progressing toward goal PT Goal: Ambulate - Progress: Progressing toward goal PT Goal: Up/Down Stairs - Progress: Not progressing  Visit Information  Last PT Received On: 12/31/12 Assistance Needed: +1    Subjective Data  Subjective: "I will try to get up." Patient Stated Goal: Decrease pain         End of Session PT - End of Session Equipment Utilized During Treatment: Back brace Activity Tolerance: Patient tolerated treatment well Patient left: in chair;with call bell/phone within reach Nurse Communication: Patient requests pain meds   GP     Wilhemina Bonito 12/31/2012, 12:30 PM

## 2013-01-01 ENCOUNTER — Inpatient Hospital Stay (HOSPITAL_COMMUNITY): Payer: MEDICARE

## 2013-01-01 MED ORDER — MAGNESIUM CITRATE PO SOLN
1.0000 | Freq: Once | ORAL | Status: AC
  Administered 2013-01-01: 1 via ORAL
  Filled 2013-01-01: qty 296

## 2013-01-01 NOTE — Progress Notes (Signed)
Filed Vitals:   12/31/12 0602 12/31/12 1300 12/31/12 2104 01/01/13 0547  BP: 115/65 127/54 140/56 149/62  Pulse: 91 96 89 93  Temp: 97.7 F (36.5 C) 97.8 F (36.6 C) 98.3 F (36.8 C) 97.8 F (36.6 C)  TempSrc:      Resp: 16 16 18 18   Height:      Weight:      SpO2: 92% 93% 94% 95%    Patient continues to complain of disabling back pain, has been mobilizing in TLSO, but requiring increasing amounts of narcotic analgesics. Did finally have a bowel movement yesterday. Abdomen is now more comfortable.  Moving all 4 extremities well.  Plan: We'll check upright AP and lateral lumbar spine x-rays again to assess whether this may further vertebral body loss of height at the L1 fracture level. However if pain does not become more manageable, and the patient more comfortable, functional, and mobile, we will need to consider a kyphoplasty. I discussed this with the patient and also have had the opportunity discuss the case and my recommendations with Dr. Lavera Guise from [facility].  Hewitt Shorts, MD 01/01/2013, 7:52 AM

## 2013-01-01 NOTE — Progress Notes (Signed)
[facility] PROGRESS NOTE  Michelle Cabrera RUE:[id number] DOB: 1945-07-31 DOA: 12/26/2012 PCP: Pcp Not In System  Assessment/Plan:  1. Intractable Low Back Pain - Due to DDD and Herniated Disk L1-L2 on MRI, CT also with possible acute L1compression fracture and Postsurgical changes status post L2-L4 laminectomy and PLIF. There is loosening of the L4 pedicle screws and posterior  displacement of the interbody spacers, especially the L3-L4 spacer - Dr Newell Coral recommended TLSO -oxycontin 20 mg po BID started 12/29/12 , with percocet 10/325 for breakthrough pain . Ibuprofen 400 mg TID added for adjuvant pain control on 12/30/12 By 12/31/12 patient c/o "worse back pain of my life " - increased the oxycontin to 20 mg every 8 hours on 12/31/12 - PT/OT following Plan is to proceed with kyphoplasty on June 9 if patient continues to be in severe pain 2. RA- Continue Methotrexate Rx,  3. HTN- Continue, Monitor BPS, PRN IV hydralazine.  4. Tobacco Use Disorder- Smoking Cessation on admission, continue Nicotine Patch.  5. Constipation - placed on senna and colace as well as mag citrate     Code Status: FULL Family Communication: patient  Disposition Plan: Home   Consultants:  NS  Procedures:  none  Antibiotics:  none  HPI/Subjective: Still with severe back pain, but with TLSO was able to ambulate with PT and sit in chair  Objective: Filed Vitals:   12/31/12 0602 12/31/12 1300 12/31/12 2104 01/01/13 0547  BP: 115/65 127/54 140/56 149/62  Pulse: 91 96 89 93  Temp: 97.7 F (36.5 C) 97.8 F (36.6 C) 98.3 F (36.8 C) 97.8 F (36.6 C)  TempSrc:      Resp: 16 16 18 18   Height:      Weight:      SpO2: 92% 93% 94% 95%    Intake/Output Summary (Last 24 hours) at 01/01/13 0751 Last data filed at 01/01/13 0417  Gross per 24 hour  Intake      0 ml  Output      1 ml  Net     -1 ml   Filed Weights   12/26/12 2300  Weight: 68.2 kg (150 lb 5.7 oz)    Exam:   General:  Alert  and orientedx3 in NAD  Cardiovascular: RRR, nl S1S2  Respiratory: CTAB  Abdomen: Soft +BS NT/ND  Extremities:No cyanosis and no edema  Data Reviewed: Basic Metabolic Panel:  Recent Labs Lab 12/26/12 1726 12/27/12 0505 12/28/12 0525  NA 137 140 136  K 3.4* 3.5 3.7  CL 102 104 100  CO2 25 28 25   GLUCOSE 117* 129* 111*  BUN 27* 19 14  CREATININE 0.58 0.64 0.45*  CALCIUM 9.4 8.6 8.2*   Liver Function Tests: No results found for this basename: AST, ALT, ALKPHOS, BILITOT, PROT, ALBUMIN,  in the last 168 hours No results found for this basename: LIPASE, AMYLASE,  in the last 168 hours No results found for this basename: AMMONIA,  in the last 168 hours CBC:  Recent Labs Lab 12/26/12 1726 12/27/12 0505 12/28/12 0525 12/29/12 0528  WBC 16.6* 13.0* 13.5* 9.8  NEUTROABS 14.4*  --   --   --   HGB 14.0 12.5 13.3 12.5  HCT 40.2 37.6 39.9 37.5  MCV 94.8 95.7 95.2 95.2  PLT 477* 444* 409* 370   Cardiac Enzymes: No results found for this basename: CKTOTAL, CKMB, CKMBINDEX, TROPONINI,  in the last 168 hours BNP (last 3 results) No results found for this basename: PROBNP,  in the last 8760  hours CBG: No results found for this basename: GLUCAP,  in the last 168 hours  No results found for this or any previous visit (from the past 240 hour(s)).   Studies: Dg Lumbar Spine 2-3 Views  12/30/2012   *RADIOLOGY REPORT*  Clinical Data: Assess L1 fracture.  LUMBAR SPINE - 2-3 VIEW  Comparison: 12/27/2012. CT lumbar spine 12/27/2012.  Findings: L1 superior endplate compression fracture appears minimally progressive, now measuring 2.1 cm in height anteriorly versus 2.6 cm on 12/27/2012.  Alignment is anatomic.  L2-4 posterior lumbar interbody fusion is unchanged. Posterior displacement of the L3-4 interbody spacer is again noted.  Posterior displacement of the L2-3 interbody spacer and loosening of the L4 pedicle screws are better evaluated on CT examination performed 3 days ago.  Facet  hypertrophy L5-S1.  IMPRESSION:  1.  Slight progression in L1 superior endplate compression fracture. 2.  L2-4 PLIF, as above, better evaluated on cross-sectional imaging performed 12/27/2012.   Original Report Authenticated By: Leanna Battles, M.D.    Scheduled Meds: . docusate sodium  100 mg Oral BID  . DULoxetine  30 mg Oral BID  . enoxaparin (LOVENOX) injection  40 mg Subcutaneous Daily  . esomeprazole  40 mg Oral QAC breakfast  . ibuprofen  400 mg Oral TID  . nicotine  14 mg Transdermal Daily  . OxyCODONE  20 mg Oral Q8H  . pregabalin  75 mg Oral TID  . senna-docusate  2 tablet Oral BID   Continuous Infusions:    Principal Problem:   Intractable low back pain Active Problems:   DDD (degenerative disc disease)   Rheumatoid arthritis   Hypertension     Michelle Cabrera  [facility] Pager [phone]. If 7PM-7AM, please contact night-coverage at [email or web address], password [facility] 01/01/2013, 7:51 AM  LOS: 6 days

## 2013-01-01 NOTE — Progress Notes (Signed)
Physical Therapy Treatment Patient Details Name: Michelle Cabrera MRN: [id number] DOB: 04/28/1946 Today's Date: 01/01/2013 Time: 7829-5621 PT Time Calculation (min): 29 min  PT Assessment / Plan / Recommendation Comments on Treatment Session  Pt with 8/10 pain today, but decreased guarding noted with ambulation.  Pt able to perform stairs today with MIN /guard.  Pt still needs cues to maintain precautions, especially twisting.    Follow Up Recommendations  Home health PT     Does the patient have the potential to tolerate intense rehabilitation     Barriers to Discharge        Equipment Recommendations  None recommended by PT    Recommendations for Other Services    Frequency Min 5X/week   Plan Discharge plan remains appropriate;Frequency remains appropriate    Precautions / Restrictions Precautions Precautions: Back;Fall Required Braces or Orthoses: Spinal Brace Spinal Brace: Applied in supine position;Thoracolumbosacral orthotic   Pertinent Vitals/Pain 8/10 back pain    Mobility  Bed Mobility Bed Mobility: Rolling Right;Rolling Left Rolling Right: 5: Supervision Rolling Left: 5: Supervision Left Sidelying to Sit: 5: Supervision Supine to Sit: 5: Supervision;With rails Sitting - Scoot to Edge of Bed: 5: Supervision Details for Bed Mobility Assistance: Cues with mobility to avoid twisting with rolling and donning of brace Transfers Transfers: Sit to Stand;Stand to Sit Sit to Stand: 5: Supervision Stand to Sit: 4: Min guard Details for Transfer Assistance: cues for hand placement Ambulation/Gait Ambulation/Gait Assistance: 4: Min guard Ambulation Distance (Feet): 140 Feet Assistive device: Rolling walker Ambulation/Gait Assistance Details: Decreased guarding with gait today. Gait Pattern: Decreased step length - left;Decreased step length - right Stairs: Yes Stairs Assistance: 4: Min guard Stair Management Technique: Step to pattern;Two rails    Exercises     PT  Diagnosis:    PT Problem List:   PT Treatment Interventions:     PT Goals Acute Rehab PT Goals PT Goal: Supine/Side to Sit - Progress: Progressing toward goal PT Goal: Sit to Supine/Side - Progress: Progressing toward goal PT Goal: Sit to Stand - Progress: Progressing toward goal PT Goal: Stand to Sit - Progress: Progressing toward goal PT Goal: Ambulate - Progress: Progressing toward goal Pt will Go Up / Down Stairs: 3-5 stairs PT Goal: Up/Down Stairs - Progress: Progressing toward goal  Visit Information  Assistance Needed: +1    Subjective Data  Subjective: I may have surgery Monday.   Cognition  Cognition Arousal/Alertness: Awake/alert Behavior During Therapy: WFL for tasks assessed/performed Overall Cognitive Status: Within Functional Limits for tasks assessed    Balance     End of Session PT - End of Session Equipment Utilized During Treatment: Back brace Activity Tolerance: Patient tolerated treatment well Patient left: in chair;with call bell/phone within reach Nurse Communication: Mobility status (pt up in chair)   GP     [name] 01/01/2013, 11:51 AM

## 2013-01-02 DIAGNOSIS — IMO0002 Reserved for concepts with insufficient information to code with codable children: Secondary | ICD-10-CM

## 2013-01-02 DIAGNOSIS — M069 Rheumatoid arthritis, unspecified: Secondary | ICD-10-CM

## 2013-01-02 DIAGNOSIS — M545 Low back pain: Secondary | ICD-10-CM

## 2013-01-02 DIAGNOSIS — I10 Essential (primary) hypertension: Secondary | ICD-10-CM

## 2013-01-02 MED ORDER — OXYCODONE HCL ER 10 MG PO T12A
20.0000 mg | EXTENDED_RELEASE_TABLET | Freq: Two times a day (BID) | ORAL | Status: DC
  Administered 2013-01-02: 20 mg via ORAL
  Filled 2013-01-02: qty 2

## 2013-01-02 NOTE — Progress Notes (Signed)
[facility] PROGRESS NOTE  Michelle Cabrera YNW:[id number] DOB: 12-Dec-1945 DOA: 12/26/2012 PCP: Pcp Not In System  Assessment/Plan:  1. Intractable Low Back Pain - Due to DDD and Herniated Disk L1-L2 on MRI, CT also with possible acute L1compression fracture and Postsurgical changes status post L2-L4 laminectomy and PLIF. There is loosening of the L4 pedicle screws and posterior  displacement of the interbody spacers, especially the L3-L4 spacer - Dr Michelle Cabrera recommended TLSO -oxycontin 20 mg po BID started 12/29/12 , with percocet 10/325 for breakthrough pain . Ibuprofen 400 mg TID added for adjuvant pain control on 12/30/12 By 12/31/12 patient c/o "worse back pain of my life " - increased the oxycontin to 20 mg every 8 hours on 12/31/12. Patient was found to be too sedated on 01/02/13 (oxycontin changed back to q 12hrs) - PT/OT following Plan is to proceed with kyphoplasty on June 9 if patient continues to be in severe pain 2. RA- Continue Methotrexate Rx,  3. HTN- Continue, Monitor BPS, PRN IV hydralazine.  4. Tobacco Use Disorder- Smoking Cessation on admission, continue Nicotine Patch.  5. Constipation - placed on senna and colace as well as mag citrate prn     Code Status: FULL Family Communication: patient  Disposition Plan: Home   Consultants:  NS  Procedures:  none  Antibiotics:  none  HPI/Subjective: Still with severe back pain, but with TLSO was able to ambulate with PT and sit in chair  Objective: Filed Vitals:   01/01/13 0547 01/01/13 1300 01/01/13 2145 01/02/13 0619  BP: 149/62 144/77 150/80 125/51  Pulse: 93 100 98 90  Temp: 97.8 F (36.6 C) 98.6 F (37 C) 98.2 F (36.8 C) 98 F (36.7 C)  TempSrc:   Oral Oral  Resp: 18 18 18 18   Height:      Weight:      SpO2: 95% 93% 96% 97%    Intake/Output Summary (Last 24 hours) at 01/02/13 0747 Last data filed at 01/01/13 1700  Gross per 24 hour  Intake      0 ml  Output    800 ml  Net   -800 ml   Filed  Weights   12/26/12 2300  Weight: 68.2 kg (150 lb 5.7 oz)    Exam:   General:  Alert and orientedx3 in NAD  Cardiovascular: RRR, nl S1S2  Respiratory: CTAB  Abdomen: Soft +BS NT/ND  Extremities:No cyanosis and no edema  Data Reviewed: Basic Metabolic Panel:  Recent Labs Lab 12/26/12 1726 12/27/12 0505 12/28/12 0525  NA 137 140 136  K 3.4* 3.5 3.7  CL 102 104 100  CO2 25 28 25   GLUCOSE 117* 129* 111*  BUN 27* 19 14  CREATININE 0.58 0.64 0.45*  CALCIUM 9.4 8.6 8.2*   Liver Function Tests: No results found for this basename: AST, ALT, ALKPHOS, BILITOT, PROT, ALBUMIN,  in the last 168 hours No results found for this basename: LIPASE, AMYLASE,  in the last 168 hours No results found for this basename: AMMONIA,  in the last 168 hours CBC:  Recent Labs Lab 12/26/12 1726 12/27/12 0505 12/28/12 0525 12/29/12 0528  WBC 16.6* 13.0* 13.5* 9.8  NEUTROABS 14.4*  --   --   --   HGB 14.0 12.5 13.3 12.5  HCT 40.2 37.6 39.9 37.5  MCV 94.8 95.7 95.2 95.2  PLT 477* 444* 409* 370   Cardiac Enzymes: No results found for this basename: CKTOTAL, CKMB, CKMBINDEX, TROPONINI,  in the last 168 hours BNP (last 3 results)  No results found for this basename: PROBNP,  in the last 8760 hours CBG: No results found for this basename: GLUCAP,  in the last 168 hours  No results found for this or any previous visit (from the past 240 hour(s)).   Studies: Dg Lumbar Spine 2-3 Views  01/01/2013   *RADIOLOGY REPORT*  Clinical Data: L1 fracture.  LUMBAR SPINE - 2-3 VIEW  Comparison: 12/30/2012.  Findings: Anterior wedge compression fracture of the L1 vertebral body with mild retropulsion and with 20% loss of height.  Prior fusion L2-L4 without change. Please see CT 12/27/2012 report.  Mild levoscoliosis lumbar spine.  Lung parenchymal changes incompletely assessed on the present exam. Pleural effusions suspected.  Gas and stool filled slightly prominent size colon.  IMPRESSION: Anterior wedge  compression fracture of the L1 vertebral body with mild retropulsion and with 20% loss of height.  Prior fusion L2-L4 without change. Please see CT 12/27/2012 report.  Lung parenchymal changes incompletely assessed on the present exam.  Pleural effusions.   Original Report Authenticated By: Michelle Cabrera, M.D.    Scheduled Meds: . docusate sodium  100 mg Oral BID  . DULoxetine  30 mg Oral BID  . enoxaparin (LOVENOX) injection  40 mg Subcutaneous Daily  . esomeprazole  40 mg Oral QAC breakfast  . ibuprofen  400 mg Oral TID  . nicotine  14 mg Transdermal Daily  . OxyCODONE  20 mg Oral Q8H  . pregabalin  75 mg Oral TID  . senna-docusate  2 tablet Oral BID   Continuous Infusions:    Principal Problem:   Intractable low back pain Active Problems:   DDD (degenerative disc disease)   Rheumatoid arthritis   Hypertension     Michelle Cabrera  [facility] Pager [phone]. If 7PM-7AM, please contact night-coverage at [email or web address], password [facility] 01/02/2013, 7:47 AM  LOS: 7 days

## 2013-01-02 NOTE — Progress Notes (Signed)
Physical Therapy Treatment Patient Details Name: Michelle Cabrera MRN: [id number] DOB: 1945-12-04 Today's Date: 01/02/2013 Time: 9562-1308 PT Time Calculation (min): 16 min  PT Assessment / Plan / Recommendation Comments on Treatment Session  Pt making steady progress toward her goals. This is her second time in hall today, walker with NT earlier.  Does need cues to adhere to back precautions with mobiltiy.    Follow Up Recommendations  Home health PT           Equipment Recommendations  None recommended by PT    Recommendations for Other Services OT consult  Frequency Min 5X/week   Plan Discharge plan remains appropriate;Frequency remains appropriate    Precautions / Restrictions Precautions Precautions: Back;Fall Precaution Comments: able to recall 2/3 precautions, needs cues to demo with mobilty Required Braces or Orthoses: Spinal Brace Spinal Brace: Thoracolumbosacral orthotic;Applied in sitting position (daugher educated on donning and returned demo)       Mobility  Bed Mobility Bed Mobility: Left Sidelying to Sit Rolling Left: 5: Supervision;With rail Left Sidelying to Sit: 4: Min guard;With rails Details for Bed Mobility Assistance: cues on correct technique with bed mobilty to decrease back pain/prevent injury Transfers Sit to Stand: 5: Supervision;From bed;From toilet;With upper extremity assist Stand to Sit: 5: Supervision;To chair/3-in-1;To toilet;With upper extremity assist Details for Transfer Assistance: cues for safe hand placement Ambulation/Gait Ambulation/Gait Assistance: 5: Supervision Ambulation Distance (Feet): 150 Feet Assistive device: Rolling walker Ambulation/Gait Assistance Details: pt with fluid, reciprocal gait pattern with light UE reliance on walker Gait Pattern: Step-through pattern;Decreased stride length Gait velocity: Steady pace.      PT Goals Acute Rehab PT Goals Pt will go Supine/Side to Sit: with modified independence PT Goal:  Supine/Side to Sit - Progress: Progressing toward goal Pt will go Sit to Stand: with modified independence PT Goal: Sit to Stand - Progress: Progressing toward goal Pt will go Stand to Sit: with modified independence PT Goal: Stand to Sit - Progress: Progressing toward goal Pt will Ambulate: >150 feet;with modified independence;with least restrictive assistive device PT Goal: Ambulate - Progress: Progressing toward goal Pt will Go Up / Down Stairs: 3-5 stairs PT Goal: Up/Down Stairs - Progress: Progressing toward goal  Visit Information  Last PT Received On: 01/02/13 Assistance Needed: +1    Subjective Data  Subjective: No new complaints, agreeable to therapy at this time. Rates her pain as 7/10 currently. Per RN pt was premedicated around noon today.   Cognition  Cognition Arousal/Alertness: Awake/alert Behavior During Therapy: WFL for tasks assessed/performed Overall Cognitive Status: Within Functional Limits for tasks assessed       End of Session PT - End of Session Equipment Utilized During Treatment: Gait belt;Back brace Activity Tolerance: Patient tolerated treatment well Patient left: in chair;with call bell/phone within reach;with family/visitor present Nurse Communication: Mobility status   GP     Sallyanne Kuster 01/02/2013, 1:34 PM  Sallyanne Kuster, PTA Office- [phone]

## 2013-01-03 MED ORDER — OXYCODONE HCL ER 10 MG PO T12A
10.0000 mg | EXTENDED_RELEASE_TABLET | Freq: Two times a day (BID) | ORAL | Status: DC
  Filled 2013-01-03: qty 2

## 2013-01-03 MED ORDER — MORPHINE SULFATE 4 MG/ML IJ SOLN
4.0000 mg | INTRAMUSCULAR | Status: DC | PRN
  Administered 2013-01-04 (×2): 2 mg via INTRAVENOUS
  Filled 2013-01-03: qty 1

## 2013-01-03 MED ORDER — MORPHINE SULFATE 2 MG/ML IJ SOLN: 2.0000 mg | INTRAMUSCULAR | Status: DC | PRN

## 2013-01-03 NOTE — Progress Notes (Signed)
[facility] PROGRESS NOTE  Michelle Cabrera NWG:[id number] DOB: 1945/12/19 DOA: 12/26/2012 PCP: Pcp Not In System  Assessment/Plan:  1. Intractable Low Back Pain - Due to DDD and Herniated Disk L1-L2 on MRI, CT also with possible acute L1compression fracture and Postsurgical changes status post L2-L4 laminectomy and PLIF. There is loosening of the L4 pedicle screws and posterior  displacement of the interbody spacers, especially the L3-L4 spacer - Dr Newell Coral recommended TLSO -oxycontin 20 mg po BID started 12/29/12 , with percocet 10/325 for breakthrough pain . Ibuprofen 400 mg TID added for adjuvant pain control on 12/30/12 By 12/31/12 patient c/o "worse back pain of my life " - increased the oxycontin to 20 mg every 8 hours on 12/31/12. Patient was found to be too sedated on 01/02/13 (oxycontin changed back to q 12hrs) - PT/OT following Plan is to proceed with kyphoplasty on June 9  2. RA- Continue Methotrexate Rx,  3. HTN- Continue, Monitor BPS, PRN IV hydralazine.  4. Tobacco Use Disorder- Smoking Cessation on admission, continue Nicotine Patch.  5. Constipation - placed on senna and colace as well as mag citrate prn     Code Status: FULL Family Communication: patient  Disposition Plan: Home   Consultants:  NS  Procedures:  none  Antibiotics:  none  HPI/Subjective: Still with severe back pain, but with TLSO was able to ambulate with PT and sit in chair  Objective: Filed Vitals:   01/02/13 1417 01/02/13 2219 01/03/13 0601 01/03/13 1510  BP: 115/59 121/54 153/78 129/71  Pulse: 83 90 92 84  Temp: 97.8 F (36.6 C) 97.9 F (36.6 C) 97.8 F (36.6 C) 98.3 F (36.8 C)  TempSrc: Oral Oral Oral Oral  Resp: 18 17 16 16   Height:      Weight:      SpO2: 97% 93% 98% 95%    Intake/Output Summary (Last 24 hours) at 01/03/13 1718 Last data filed at 01/03/13 1539  Gross per 24 hour  Intake    880 ml  Output    401 ml  Net    479 ml   Filed Weights   12/26/12 2300   Weight: 68.2 kg (150 lb 5.7 oz)    Exam:   General:  Alert and orientedx3 in NAD  Cardiovascular: RRR, nl S1S2  Respiratory: CTAB  Abdomen: Soft +BS NT/ND  Extremities:No cyanosis and no edema  Data Reviewed: Basic Metabolic Panel:  Recent Labs Lab 12/28/12 0525  NA 136  K 3.7  CL 100  CO2 25  GLUCOSE 111*  BUN 14  CREATININE 0.45*  CALCIUM 8.2*   Liver Function Tests: No results found for this basename: AST, ALT, ALKPHOS, BILITOT, PROT, ALBUMIN,  in the last 168 hours No results found for this basename: LIPASE, AMYLASE,  in the last 168 hours No results found for this basename: AMMONIA,  in the last 168 hours CBC:  Recent Labs Lab 12/28/12 0525 12/29/12 0528  WBC 13.5* 9.8  HGB 13.3 12.5  HCT 39.9 37.5  MCV 95.2 95.2  PLT 409* 370   Cardiac Enzymes: No results found for this basename: CKTOTAL, CKMB, CKMBINDEX, TROPONINI,  in the last 168 hours BNP (last 3 results) No results found for this basename: PROBNP,  in the last 8760 hours CBG: No results found for this basename: GLUCAP,  in the last 168 hours  No results found for this or any previous visit (from the past 240 hour(s)).   Studies: No results found.  Scheduled Meds: . docusate sodium  100 mg Oral BID  . DULoxetine  30 mg Oral BID  . esomeprazole  40 mg Oral QAC breakfast  . ibuprofen  400 mg Oral TID  . nicotine  14 mg Transdermal Daily  . OxyCODONE  10 mg Oral Q12H  . pregabalin  75 mg Oral TID  . senna-docusate  2 tablet Oral BID   Continuous Infusions:    Principal Problem:   Intractable low back pain Active Problems:   DDD (degenerative disc disease)   Rheumatoid arthritis   Hypertension     Michelle Cabrera  [facility] Pager [phone]. If 7PM-7AM, please contact night-coverage at [email or web address], password [facility] 01/03/2013, 5:18 PM  LOS: 8 days

## 2013-01-03 NOTE — Progress Notes (Signed)
Patient with hallucinations, picking air. Answers orientation ques. Appropriately. Dr Lavera Guise informed. Orders put in.

## 2013-01-04 ENCOUNTER — Inpatient Hospital Stay (HOSPITAL_COMMUNITY): Payer: MEDICARE

## 2013-01-04 ENCOUNTER — Encounter (HOSPITAL_COMMUNITY)
Admission: EM | Disposition: A | Discharge status: Home / Self Care | Payer: Self-pay | Source: Home / Self Care | Attending: Internal Medicine

## 2013-01-04 ENCOUNTER — Encounter (HOSPITAL_COMMUNITY): Payer: Self-pay | Admitting: Certified Registered"

## 2013-01-04 ENCOUNTER — Inpatient Hospital Stay (HOSPITAL_COMMUNITY): Payer: MEDICARE | Admitting: Certified Registered"

## 2013-01-04 HISTORY — PX: KYPHOPLASTY: SHX5884

## 2013-01-04 LAB — BASIC METABOLIC PANEL
Calcium: 9.1 mg/dL (ref 8.4–10.5)
GFR calc Af Amer: >90 mL/min (ref >90)
GFR calc non Af Amer: >90 mL/min (ref >90)
Glucose, Bld: 84 mg/dL (ref 70–99)
Potassium: 4.3 mEq/L (ref 3.5–5.1)
Sodium: 139 mEq/L (ref 135–145)

## 2013-01-04 LAB — PROTIME-INR: INR: 1.01 (ref 0.00–1.49)

## 2013-01-04 LAB — CBC
HCT: 38.3 % (ref 36.0–46.0)
MCHC: 33.2 g/dL (ref 30.0–36.0)
MCV: 95 fL (ref 78.0–100.0)
Platelets: 414 10*3/uL — ABNORMAL HIGH (ref 150–400)
RDW: 15.1 % (ref 11.5–15.5)
WBC: 7.7 10*3/uL (ref 4.0–10.5)

## 2013-01-04 SURGERY — KYPHOPLASTY
Anesthesia: General | Site: Back | Wound class: Clean

## 2013-01-04 MED ORDER — 0.9 % SODIUM CHLORIDE (POUR BTL) OPTIME
TOPICAL | Status: DC | PRN
  Administered 2013-01-04: 1000 mL

## 2013-01-04 MED ORDER — OXYCODONE-ACETAMINOPHEN 5-325 MG PO TABS: 1.0000 | ORAL_TABLET | ORAL | Status: DC | PRN

## 2013-01-04 MED ORDER — PROPOFOL 10 MG/ML IV BOLUS
INTRAVENOUS | Status: DC | PRN
  Administered 2013-01-04: 130 mg via INTRAVENOUS

## 2013-01-04 MED ORDER — MORPHINE SULFATE 2 MG/ML IJ SOLN
1.0000 mg | INTRAMUSCULAR | Status: DC | PRN
  Administered 2013-01-04 (×3): 1 mg via INTRAVENOUS

## 2013-01-04 MED ORDER — CEFAZOLIN SODIUM-DEXTROSE 2-3 GM-% IV SOLR
2.0000 g | Freq: Once | INTRAVENOUS | Status: AC
  Administered 2013-01-04: 2 g via INTRAVENOUS

## 2013-01-04 MED ORDER — LIDOCAINE-EPINEPHRINE 1 %-1:100000 IJ SOLN
INTRAMUSCULAR | Status: DC | PRN
  Administered 2013-01-04: 9.5 mL

## 2013-01-04 MED ORDER — KETOROLAC TROMETHAMINE 30 MG/ML IJ SOLN
30.0000 mg | Freq: Once | INTRAMUSCULAR | Status: AC
  Administered 2013-01-04: 30 mg via INTRAVENOUS

## 2013-01-04 MED ORDER — LIDOCAINE HCL 4 % MT SOLN
OROMUCOSAL | Status: DC | PRN
  Administered 2013-01-04: 4 mL via TOPICAL

## 2013-01-04 MED ORDER — ROCURONIUM BROMIDE 100 MG/10ML IV SOLN
INTRAVENOUS | Status: DC | PRN
  Administered 2013-01-04: 40 mg via INTRAVENOUS

## 2013-01-04 MED ORDER — ONDANSETRON HCL 4 MG/2ML IJ SOLN
INTRAMUSCULAR | Status: DC | PRN
  Administered 2013-01-04: 4 mg via INTRAVENOUS

## 2013-01-04 MED ORDER — FENTANYL CITRATE 0.05 MG/ML IJ SOLN
INTRAMUSCULAR | Status: DC | PRN
  Administered 2013-01-04: 100 ug via INTRAVENOUS
  Administered 2013-01-04 (×2): 50 ug via INTRAVENOUS

## 2013-01-04 MED ORDER — LIDOCAINE HCL (CARDIAC) 20 MG/ML IV SOLN
INTRAVENOUS | Status: DC | PRN
  Administered 2013-01-04: 40 mg via INTRAVENOUS

## 2013-01-04 MED ORDER — NEOSTIGMINE METHYLSULFATE 1 MG/ML IJ SOLN
INTRAMUSCULAR | Status: DC | PRN
  Administered 2013-01-04: 3 mg via INTRAVENOUS

## 2013-01-04 MED ORDER — KETOROLAC TROMETHAMINE 30 MG/ML IJ SOLN
30.0000 mg | Freq: Four times a day (QID) | INTRAMUSCULAR | Status: DC
  Administered 2013-01-04 – 2013-01-05 (×2): 30 mg via INTRAVENOUS
  Filled 2013-01-04 (×7): qty 1

## 2013-01-04 MED ORDER — PHENYLEPHRINE HCL 10 MG/ML IJ SOLN
INTRAMUSCULAR | Status: DC | PRN
  Administered 2013-01-04 (×2): 80 ug via INTRAVENOUS

## 2013-01-04 MED ORDER — BUPIVACAINE HCL (PF) 0.5 % IJ SOLN
INTRAMUSCULAR | Status: DC | PRN
  Administered 2013-01-04: 9.5 mL

## 2013-01-04 MED ORDER — IOHEXOL 300 MG/ML  SOLN
INTRAMUSCULAR | Status: DC | PRN
  Administered 2013-01-04: 50 mL

## 2013-01-04 MED ORDER — ACETAMINOPHEN 10 MG/ML IV SOLN
1000.0000 mg | Freq: Once | INTRAVENOUS | Status: AC
  Administered 2013-01-04: 1000 mg via INTRAVENOUS

## 2013-01-04 MED ORDER — LACTATED RINGERS IV SOLN
INTRAVENOUS | Status: DC | PRN
  Administered 2013-01-04: 09:00:00 via INTRAVENOUS

## 2013-01-04 MED ORDER — GLYCOPYRROLATE 0.2 MG/ML IJ SOLN
INTRAMUSCULAR | Status: DC | PRN
  Administered 2013-01-04: 0.4 mg via INTRAVENOUS

## 2013-01-04 MED ORDER — KCL IN DEXTROSE-NACL 20-5-0.45 MEQ/L-%-% IV SOLN
INTRAVENOUS | Status: DC
  Filled 2013-01-04 (×3): qty 1000

## 2013-01-04 SURGICAL SUPPLY — 44 items
BANDAGE ADHESIVE 1X3 (GAUZE/BANDAGES/DRESSINGS) IMPLANT
BLADE SURG 11 STRL SS (BLADE) ×2 IMPLANT
BLADE SURG ROTATE 9660 (MISCELLANEOUS) IMPLANT
CEMENT BONE KYPHX HV R (Orthopedic Implant) ×2 IMPLANT
CEMENT KYPHON C01A KIT/MIXER (Cement) ×2 IMPLANT
CLOTH BEACON ORANGE TIMEOUT ST (SAFETY) ×2 IMPLANT
CONT SPEC 4OZ CLIKSEAL STRL BL (MISCELLANEOUS) ×2 IMPLANT
DECANTER SPIKE VIAL GLASS SM (MISCELLANEOUS) ×2 IMPLANT
DERMABOND ADVANCED (GAUZE/BANDAGES/DRESSINGS) ×1
DERMABOND ADVANCED .7 DNX12 (GAUZE/BANDAGES/DRESSINGS) ×1 IMPLANT
DRAPE C-ARM 42X72 X-RAY (DRAPES) ×4 IMPLANT
DRAPE INCISE IOBAN 66X45 STRL (DRAPES) ×2 IMPLANT
DRAPE LAPAROTOMY 100X72X124 (DRAPES) ×2 IMPLANT
DRAPE PROXIMA HALF (DRAPES) ×2 IMPLANT
DURAPREP 26ML APPLICATOR (WOUND CARE) ×2 IMPLANT
GAUZE SPONGE 4X4 16PLY XRAY LF (GAUZE/BANDAGES/DRESSINGS) ×2 IMPLANT
GLOVE BIOGEL PI IND STRL 7.0 (GLOVE) ×2 IMPLANT
GLOVE BIOGEL PI IND STRL 8 (GLOVE) ×1 IMPLANT
GLOVE BIOGEL PI INDICATOR 7.0 (GLOVE) ×2
GLOVE BIOGEL PI INDICATOR 8 (GLOVE) ×1
GLOVE ECLIPSE 7.5 STRL STRAW (GLOVE) ×2 IMPLANT
GLOVE EXAM NITRILE LRG STRL (GLOVE) IMPLANT
GLOVE EXAM NITRILE MD LF STRL (GLOVE) IMPLANT
GLOVE EXAM NITRILE XL STR (GLOVE) IMPLANT
GLOVE EXAM NITRILE XS STR PU (GLOVE) IMPLANT
GLOVE SS BIOGEL STRL SZ 6.5 (GLOVE) ×2 IMPLANT
GLOVE SUPERSENSE BIOGEL SZ 6.5 (GLOVE) ×2
GOWN BRE IMP SLV AUR LG STRL (GOWN DISPOSABLE) ×2 IMPLANT
GOWN BRE IMP SLV AUR XL STRL (GOWN DISPOSABLE) ×2 IMPLANT
GOWN STRL REIN 2XL LVL4 (GOWN DISPOSABLE) IMPLANT
KIT BASIN OR (CUSTOM PROCEDURE TRAY) ×2 IMPLANT
KIT ROOM TURNOVER OR (KITS) ×2 IMPLANT
NEEDLE HYPO 25X1 1.5 SAFETY (NEEDLE) ×2 IMPLANT
NS IRRIG 1000ML POUR BTL (IV SOLUTION) ×2 IMPLANT
PACK SURGICAL SETUP 50X90 (CUSTOM PROCEDURE TRAY) ×2 IMPLANT
PAD ARMBOARD 7.5X6 YLW CONV (MISCELLANEOUS) ×6 IMPLANT
SPECIMEN JAR SMALL (MISCELLANEOUS) IMPLANT
SUT VIC AB 3-0 SH 8-18 (SUTURE) ×2 IMPLANT
SUT VIC AB 4-0 P-3 18X BRD (SUTURE) IMPLANT
SUT VIC AB 4-0 P3 18 (SUTURE)
SYR CONTROL 10ML LL (SYRINGE) ×2 IMPLANT
TOWEL OR 17X24 6PK STRL BLUE (TOWEL DISPOSABLE) ×2 IMPLANT
TOWEL OR 17X26 10 PK STRL BLUE (TOWEL DISPOSABLE) ×2 IMPLANT
TRAY KYPHOPAK 20/3 ONESTEP 1ST (MISCELLANEOUS) ×2 IMPLANT

## 2013-01-04 NOTE — Progress Notes (Signed)
Pt has had increasing pain throughout the night. Pain is unrelieved by tylenol and flexeril, her only po meds. Pt is oriented and has had no episodes of confusion or hallucinations.  IV restarted and Morphine 2 mg given with good pain relief.

## 2013-01-04 NOTE — Progress Notes (Signed)
Subjective: Patient resting in bed, much more comfortable, now with minimal discomfort. Has not needed any pain medication since arrival back on the floor.  Objective: Vital signs in last 24 hours: Filed Vitals:   01/04/13 1058 01/04/13 1100 01/04/13 1110 01/04/13 1115  BP: 178/77 178/77 152/80   Pulse: 94 95 88   Temp:   97.8 F (36.6 C) 98 F (36.7 C)  TempSrc:      Resp: 40 25 16   Height:      Weight:      SpO2: 96% 96% 96%     Intake/Output from previous day: 06/08 0701 - 06/09 0700 In: 960 [P.O.:960] Out: 801 [Urine:801] Intake/Output this shift: Total I/O In: 150 [I.V.:150] Out: -   Physical Exam:  Wounds clean and dry.  CBC  Recent Labs  01/04/13 0345  WBC 7.7  HGB 12.7  HCT 38.3  PLT 414*   BMET  Recent Labs  01/04/13 0345  NA 139  K 4.3  CL 102  CO2 28  GLUCOSE 84  BUN 10  CREATININE 0.61  CALCIUM 9.1    Studies/Results: Dg Lumbar Spine 2-3 Views  01/04/2013   *RADIOLOGY REPORT*  Clinical Data: L1 kyphoplasty.  DG C-ARM 1-60 MIN, LUMBAR SPINE - 2-3 VIEW  Comparison: Plain films lumbar spine 01/01/2013.  Findings: We are provided with two fluoroscopic intraoperative spot views of the lumbar spine.  Images demonstrate new methylmethacrylate in the L1 vertebral body for a compression fracture deformity.  No acute abnormality is identified.  Spinal fusion hardware from L2-L4 is partially visualized.  IMPRESSION: L1 kyphoplasty.  No acute abnormality.   Original Report Authenticated By: Holley Dexter, M.D.   Dg C-arm 1-60 Min  01/04/2013   *RADIOLOGY REPORT*  Clinical Data: L1 kyphoplasty.  DG C-ARM 1-60 MIN, LUMBAR SPINE - 2-3 VIEW  Comparison: Plain films lumbar spine 01/01/2013.  Findings: We are provided with two fluoroscopic intraoperative spot views of the lumbar spine.  Images demonstrate new methylmethacrylate in the L1 vertebral body for a compression fracture deformity.  No acute abnormality is identified.  Spinal fusion hardware from L2-L4  is partially visualized.  IMPRESSION: L1 kyphoplasty.  No acute abnormality.   Original Report Authenticated By: Holley Dexter, M.D.    Assessment/Plan: Patient doing well following kyphoplasty with significant improvement in pain management.  I spoke with the patient, her sister, and her nurse Lurena Joiner) that I would like patient to mobilize today. She needs to don and doff her brace in bed, and I would like to ambulate this afternoon, and again in the evening.   Hewitt Shorts, MD 01/04/2013, 5:15 PM

## 2013-01-04 NOTE — Anesthesia Procedure Notes (Signed)
Procedure Name: Intubation Date/Time: 01/04/2013 9:21 AM Performed by: Jerilee Hoh Pre-anesthesia Checklist: Patient identified, Emergency Drugs available, Suction available and Patient being monitored Patient Re-evaluated:Patient Re-evaluated prior to inductionOxygen Delivery Method: Circle system utilized Preoxygenation: Pre-oxygenation with 100% oxygen Intubation Type: IV induction Ventilation: Mask ventilation without difficulty Laryngoscope Size: Mac and 3 Grade View: Grade II Tube type: Oral Tube size: 7.5 mm Number of attempts: 1 Airway Equipment and Method: Stylet and LTA kit utilized Placement Confirmation: ETT inserted through vocal cords under direct vision,  positive ETCO2 and breath sounds checked- equal and bilateral Secured at: 21 cm Tube secured with: Tape Dental Injury: Teeth and Oropharynx as per pre-operative assessment

## 2013-01-04 NOTE — Anesthesia Preprocedure Evaluation (Signed)
Anesthesia Evaluation  Patient identified by MRN, date of birth, ID band Patient awake    Reviewed: Allergy & Precautions, H&P , NPO status , Patient's Chart, lab work & pertinent test results  Airway Mallampati: II TM Distance: >3 FB Neck ROM: Full    Dental no notable dental hx. (+) Teeth Intact and Dental Advisory Given   Pulmonary neg pulmonary ROS,  breath sounds clear to auscultation  Pulmonary exam normal       Cardiovascular hypertension, On Medications Rhythm:Regular Rate:Normal     Neuro/Psych negative neurological ROS  negative psych ROS   GI/Hepatic negative GI ROS, Neg liver ROS,   Endo/Other  negative endocrine ROS  Renal/GU negative Renal ROS  negative genitourinary   Musculoskeletal   Abdominal   Peds  Hematology negative hematology ROS (+)   Anesthesia Other Findings   Reproductive/Obstetrics negative OB ROS                           Anesthesia Physical Anesthesia Plan  ASA: II  Anesthesia Plan: General   Post-op Pain Management:    Induction: Intravenous  Airway Management Planned: Oral ETT  Additional Equipment:   Intra-op Plan:   Post-operative Plan: Extubation in OR  Informed Consent: I have reviewed the patients History and Physical, chart, labs and discussed the procedure including the risks, benefits and alternatives for the proposed anesthesia with the patient or authorized representative who has indicated his/her understanding and acceptance.   Dental advisory given  Plan Discussed with: CRNA  Anesthesia Plan Comments:         Anesthesia Quick Evaluation

## 2013-01-04 NOTE — Anesthesia Postprocedure Evaluation (Signed)
  Anesthesia Post-op Note  Patient: Michelle Cabrera  Procedure(s) Performed: Procedure(s) with comments: L1 Kyphoplasty (N/A) - L1 Kyphoplasty  Patient Location: PACU  Anesthesia Type:General  Level of Consciousness: awake, alert  and oriented  Airway and Oxygen Therapy: Patient Spontanous Breathing and Patient connected to nasal cannula oxygen  Post-op Pain: moderate  Post-op Assessment: Post-op Vital signs reviewed, Patient's Cardiovascular Status Stable, Respiratory Function Stable, Patent Airway and No signs of Nausea or vomiting  Post-op Vital Signs: Reviewed and stable  Complications: No apparent anesthesia complications

## 2013-01-04 NOTE — Progress Notes (Signed)
Subjective: Patient has had continued difficulties with pain management through the weekend. Continues to complain of pain in the lumbar region. Developed significant confusion and hallucinations yesterday from the large amount of narcotic analgesics given. Pain medications other than small doses of morphine IV were stopped, and patient is now clear.  X-rays lumbar spine 3 days ago showed no significant progression of L1 compression fracture.  I discussed with the patient 3 days ago the possibility of kyphoplasty. Patient does want to proceed with that, since the pain has not eased, and she's developing complications to pain management.  Objective: Vital signs in last 24 hours: Filed Vitals:   01/03/13 0601 01/03/13 1510 01/03/13 2117 01/04/13 0810  BP: 153/78 129/71 117/63 151/65  Pulse: 92 84 93 87  Temp: 97.8 F (36.6 C) 98.3 F (36.8 C) 98.5 F (36.9 C) 98.5 F (36.9 C)  TempSrc: Oral Oral Oral   Resp: 16 16 17 16   Height:      Weight:      SpO2: 98% 95% 92% 94%    Intake/Output from previous day: 06/08 0701 - 06/09 0700 In: 960 [P.O.:960] Out: 801 [Urine:801] Intake/Output this shift:    Physical Exam:  Awake and alert, following commands. Moving all 4 extremities well.  CBC  Recent Labs  01/04/13 0345  WBC 7.7  HGB 12.7  HCT 38.3  PLT 414*   BMET  Recent Labs  01/04/13 0345  NA 139  K 4.3  CL 102  CO2 28  GLUCOSE 84  BUN 10  CREATININE 0.61  CALCIUM 9.1    Assessment/Plan: L1 compression fracture with persistent disabling back pain, with complications including confusion and hallucinations related to narcotics for pain management. Patient wished to proceed with L1 kyphoplasty. Spoke with the patient and her husband this morning, discussed the nature the procedure and its risks including as infection, bleeding, neurologic deficit, and anesthetic complications. Their questions regarding this. For them and they wish for me to proceed with surgery. OR  contacted to schedule procedure.   Hewitt Shorts, MD 01/04/2013, 8:36 AM

## 2013-01-04 NOTE — Op Note (Signed)
12/26/2012 - 01/04/2013  10:20 AM  PATIENT:  Michelle Cabrera  67 y.o. female  PRE-OPERATIVE DIAGNOSIS:  L1 compression fracture, lumbago  POST-OPERATIVE DIAGNOSIS:  L1 compression fracture, lumbago  PROCEDURE:  Procedure(s): L1 Kyphoplasty  SURGEON:  Surgeon(s): Hewitt Shorts, MD  ANESTHESIA:   general  EBL:  nil  BLOOD ADMINISTERED:none  COUNT: Correct per nursing staff  DICTATION: Patient was brought to the operating room, placed under general endotracheal anesthesia. Patient was turned to prone position. The circumflex lumbar region was prepped with a DuraPrep, and draped in a sterile fashion. 2 C-arm fluoroscope's were draped and positioned in AP and lateral projections. The L1 compression fracture was visualized, and then entry points for identified bilaterally, and the underlying skin and subcutaneous tissue was infiltrated with local site with epinephrine. Using C-arm fluoroscopic guidance in AP and lateral projections a cannulated trocar was passed, bilaterally, from a superior lateral position relative to each pedicle entry point, through the pedicle, into the vertebral body. We then drilled a channel in the vertebral body bilaterally. The kyphoplasty balloon catheter was passed into the vertebral body on either side, and gradually inflated on each side. We were able to gradually reduce the compression of the superior endplate and overall deformity. A good cavity was created on each side of the vertebral body. We then mixed the methylmethacrylate, and when ready, deflated the kyphoplasty balloon first on the right side, and subsequently on the left side. Each kyphoplasty cavity was incredibly filled with methylmethacrylate, to a total of 4.5 cc on each side. All this was done with C-arm fluoroscopic guidance in AP and lateral projections. Good filling of the cavity and maintenance of the reduction of the compression of the superior endplate of L1, and of the overall deformity was  achieved. We then removed the methylmethacrylate injection cannulas, no tracking back into the pedicle was noted. It was then removed. We then gradually removed the cannulated trochars, again no tracking back of the methylmethacrylate into the pedicles was noted. We then closed the subcutaneous surgical air with the single interrupted inverted 3-0 Vicryl suture in each side. Dermabond was applied to the incisions to approximate skin edges. Patient was then turned back in supine position to be reversed an anesthetic, extubated, transferred to the recovery further care.  PLAN OF CARE: Admit to inpatient   PATIENT DISPOSITION:  PACU - hemodynamically stable.   Delay start of Pharmacological VTE agent (>24hrs) due to surgical blood loss or risk of bleeding:  yes

## 2013-01-04 NOTE — Progress Notes (Signed)
[facility] PROGRESS NOTE  Michelle Cabrera UJW:[id number] DOB: Dec 31, 1945 DOA: 12/26/2012 PCP: Pcp Not In System  Assessment/Plan:  1. Intractable Low Back Pain - Due to DDD and Herniated Disk L1-L2 on MRI, CT also with possible acute L1compression fracture and Postsurgical changes status post L2-L4 laminectomy and PLIF. There is loosening of the L4 pedicle screws and posterior  displacement of the interbody spacers, especially the L3-L4 spacer - Dr Newell Coral recommended TLSO -oxycontin 20 mg po BID started 12/29/12 , with percocet 10/325 for breakthrough pain . Ibuprofen 400 mg TID added for adjuvant pain control on 12/30/12 By 12/31/12 patient c/o "worse back pain of my life " - increased the oxycontin to 20 mg every 8 hours on 12/31/12. Patient was found to be too sedated on 01/02/13 (oxycontin changed back to q 12hrs) - PT/OT following On 01/03/13 patient started hallucinating - oxycodone was stoped.  6/9 the patient underwent kyphoplasty  2. RA- Continue Methotrexate Rx,  3. HTN- Continue, Monitor BPS, PRN IV hydralazine.  4. Tobacco Use Disorder- Smoking Cessation on admission, continue Nicotine Patch.  5. Constipation - placed on senna and colace as well as mag citrate prn     Code Status: FULL Family Communication: patient  Disposition Plan: Home   Consultants:  NS  Procedures:  none  Antibiotics:  none  HPI/Subjective: Still with severe back pain, but with TLSO was able to ambulate with PT and sit in chair  Objective: Filed Vitals:   01/04/13 1058 01/04/13 1100 01/04/13 1110 01/04/13 1115  BP: 178/77 178/77 152/80   Pulse: 94 95 88   Temp:   97.8 F (36.6 C) 98 F (36.7 C)  TempSrc:      Resp: 40 25 16   Height:      Weight:      SpO2: 96% 96% 96%     Intake/Output Summary (Last 24 hours) at 01/04/13 1551 Last data filed at 01/04/13 1100  Gross per 24 hour  Intake    630 ml  Output    400 ml  Net    230 ml   Filed Weights   12/26/12 2300  Weight:  68.2 kg (150 lb 5.7 oz)    Exam:   General:  Asleep post procedure   Cardiovascular: RRR, nl S1S2  Respiratory: CTAB  Abdomen: Soft +BS NT/ND  Extremities:No cyanosis and no edema  Data Reviewed: Basic Metabolic Panel:  Recent Labs Lab 01/04/13 0345  NA 139  K 4.3  CL 102  CO2 28  GLUCOSE 84  BUN 10  CREATININE 0.61  CALCIUM 9.1   Liver Function Tests: No results found for this basename: AST, ALT, ALKPHOS, BILITOT, PROT, ALBUMIN,  in the last 168 hours No results found for this basename: LIPASE, AMYLASE,  in the last 168 hours No results found for this basename: AMMONIA,  in the last 168 hours CBC:  Recent Labs Lab 12/29/12 0528 01/04/13 0345  WBC 9.8 7.7  HGB 12.5 12.7  HCT 37.5 38.3  MCV 95.2 95.0  PLT 370 414*   Cardiac Enzymes: No results found for this basename: CKTOTAL, CKMB, CKMBINDEX, TROPONINI,  in the last 168 hours BNP (last 3 results) No results found for this basename: PROBNP,  in the last 8760 hours CBG: No results found for this basename: GLUCAP,  in the last 168 hours  Recent Results (from the past 240 hour(s))  MRSA PCR SCREENING     Status: None   Collection Time    01/04/13  8:51  AM      Result Value Range Status   MRSA by PCR NEGATIVE  NEGATIVE Final   Comment:            The GeneXpert MRSA Assay (FDA     approved for NASAL specimens     only), is one component of a     comprehensive MRSA colonization     surveillance program. It is not     intended to diagnose MRSA     infection nor to guide or     monitor treatment for     MRSA infections.     Studies: Dg Lumbar Spine 2-3 Views  01/04/2013   *RADIOLOGY REPORT*  Clinical Data: L1 kyphoplasty.  DG C-ARM 1-60 MIN, LUMBAR SPINE - 2-3 VIEW  Comparison: Plain films lumbar spine 01/01/2013.  Findings: We are provided with two fluoroscopic intraoperative spot views of the lumbar spine.  Images demonstrate new methylmethacrylate in the L1 vertebral body for a compression fracture  deformity.  No acute abnormality is identified.  Spinal fusion hardware from L2-L4 is partially visualized.  IMPRESSION: L1 kyphoplasty.  No acute abnormality.   Original Report Authenticated By: Holley Dexter, M.D.   Dg C-arm 1-60 Min  01/04/2013   *RADIOLOGY REPORT*  Clinical Data: L1 kyphoplasty.  DG C-ARM 1-60 MIN, LUMBAR SPINE - 2-3 VIEW  Comparison: Plain films lumbar spine 01/01/2013.  Findings: We are provided with two fluoroscopic intraoperative spot views of the lumbar spine.  Images demonstrate new methylmethacrylate in the L1 vertebral body for a compression fracture deformity.  No acute abnormality is identified.  Spinal fusion hardware from L2-L4 is partially visualized.  IMPRESSION: L1 kyphoplasty.  No acute abnormality.   Original Report Authenticated By: Holley Dexter, M.D.    Scheduled Meds: . docusate sodium  100 mg Oral BID  . DULoxetine  30 mg Oral BID  . esomeprazole  40 mg Oral QAC breakfast  . ibuprofen  400 mg Oral TID  . ketorolac  30 mg Intravenous Q6H  . nicotine  14 mg Transdermal Daily  . pregabalin  75 mg Oral TID  . senna-docusate  2 tablet Oral BID   Continuous Infusions:    Principal Problem:   Intractable low back pain Active Problems:   DDD (degenerative disc disease)   Rheumatoid arthritis   Hypertension     Michelle Cabrera  [facility] Pager [phone]. If 7PM-7AM, please contact night-coverage at [email or web address], password [facility] 01/04/2013, 3:51 PM  LOS: 9 days

## 2013-01-04 NOTE — Progress Notes (Signed)
OT Cancellation Note  Patient Details Name: Michelle Cabrera MRN: [id number] DOB: [birth year]   Cancelled Treatment:    Reason Eval/Treat Not Completed: Patient at procedure or test/ unavailable. Kyphoplasty today. Will re attempt tomorrow as appropriate  Galen Manila, OT 01/04/2013, 11:33 AM

## 2013-01-04 NOTE — Preoperative (Signed)
Beta Blockers   Reason not to administer Beta Blockers:Not Applicable 

## 2013-01-04 NOTE — Progress Notes (Signed)
Physical Therapy Note   01/04/13 1100  PT Visit Information  Last PT Received On 01/04/13  Reason Eval/Treat Not Completed Patient at procedure or test/unavailable (kyphoplasty today)   Van Clines, PT [phone]

## 2013-01-04 NOTE — Transfer of Care (Signed)
Immediate Anesthesia Transfer of Care Note  Patient: Michelle Cabrera  Procedure(s) Performed: Procedure(s) with comments: L1 Kyphoplasty (N/A) - L1 Kyphoplasty  Patient Location: PACU  Anesthesia Type:General  Level of Consciousness: patient cooperative and responds to stimulation  Airway & Oxygen Therapy: Patient Spontanous Breathing and Patient connected to nasal cannula oxygen  Post-op Assessment: Report given to PACU RN, Post -op Vital signs reviewed and stable and Patient moving all extremities  Post vital signs: Reviewed and stable  Complications: No apparent anesthesia complications

## 2013-01-05 ENCOUNTER — Encounter (HOSPITAL_COMMUNITY): Payer: Self-pay | Admitting: Neurosurgery

## 2013-01-05 LAB — BASIC METABOLIC PANEL
BUN: 10 mg/dL (ref 6–23)
CO2: 26 mEq/L (ref 19–32)
Chloride: 103 mEq/L (ref 96–112)
GFR calc non Af Amer: >90 mL/min (ref >90)
Glucose, Bld: 83 mg/dL (ref 70–99)
Potassium: 3.7 mEq/L (ref 3.5–5.1)
Sodium: 138 mEq/L (ref 135–145)

## 2013-01-05 MED ORDER — IBUPROFEN 400 MG PO TABS: 400.0000 mg | ORAL_TABLET | Freq: Three times a day (TID) | ORAL | Status: DC | PRN

## 2013-01-05 NOTE — Progress Notes (Signed)
Pt discharged to home accompanied by family. Discharge instructions and rx given and explained and pt stated understanding. Pts IV was removed. Pt left unit in a stable condition via wheelchair. 

## 2013-01-05 NOTE — Progress Notes (Signed)
Subjective: Patient had significant difficulty overnight with confusion, and some agitation. Her family including husband and/or son stayed with her through the night. Not given any narcotic since returning to the floor from the PACU. Has been receiving Toradol. Staff and family reports that she did well ambulating in the unit last evening.  Objective: Vital signs in last 24 hours: Filed Vitals:   01/04/13 1115 01/04/13 1742 01/04/13 2101 01/05/13 0642  BP:  153/71 119/60 133/93  Pulse:  92 121 85  Temp: 98 F (36.7 C) 98.4 F (36.9 C) 98.6 F (37 C) 98.8 F (37.1 C)  TempSrc:   Oral Oral  Resp:  18 18 19   Height:      Weight:      SpO2:  94% 95% 95%    Intake/Output from previous day: 06/09 0701 - 06/10 0700 In: 270 [P.O.:120; I.V.:150] Out: 950 [Urine:950] Intake/Output this shift:    Physical Exam:  Lethargic, but arousable, but currently just back off to sleep. Denies much in the way of back pain.  CBC  Recent Labs  01/04/13 0345  WBC 7.7  HGB 12.7  HCT 38.3  PLT 414*   BMET  Recent Labs  01/04/13 0345 01/05/13 0415  NA 139 138  K 4.3 3.7  CL 102 103  CO2 28 26  GLUCOSE 84 83  BUN 10 10  CREATININE 0.61 0.55  CALCIUM 9.1 8.8    Assessment/Plan: Back pain much improved following kyphoplasty. Able to be up and about more easily. However her greatest problem at this time is altered mental status with confusion, agitation, and now lethargy. Have asked family and staff to mobilize when more alert.  In the long run she will need to followup with me in the office in 3-4 weeks with AP and lateral lumbar x-rays. Between now and then we will want her including everyday out of a chair. She will need to continue to use her TLSO, donning and doffing it in bed with her family's assistance. She is to avoid any bending, lifting, or twisting (no BLT). She will also need to followup with her primary physician following discharge.   Hewitt Shorts, MD 01/05/2013,  7:26 AM

## 2013-01-05 NOTE — Progress Notes (Signed)
Occupational Therapy Evaluation Patient Details Name: Michelle Cabrera MRN: [id number] DOB: 1946-07-21 Today's Date: 01/05/2013 Time: 2725-3664 OT Time Calculation (min): 41 min  OT Assessment / Plan / Recommendation Clinical Impression  67 yo s/p multiple back surgeries with LBP. Underwent kyphoplasty. Per Dr. Shearon Stalls note, pt to donn/doff TLSO in supine and to wear at all times when OOB. Completed ADL session with pt/husband with husband learning how to donn/doff brace appropriately and how to assist pt with ADL according to back precautions. Husband/pt stated session was very helpful. Pt ready for D/C and will benefit from further HHOT.     OT Assessment  Patient needs continued OT Services    Follow Up Recommendations  Home health OT    Barriers to Discharge None    Equipment Recommendations  None recommended by OT    Recommendations for Other Services    Frequency  Min 2X/week    Precautions / Restrictions Precautions Precautions: Back;Fall Precaution Booklet Issued: Yes (comment) Precaution Comments: Pt able to recall back precautions Required Braces or Orthoses: Spinal Brace Spinal Brace: Thoracolumbosacral orthotic;Applied in sitting position Restrictions Weight Bearing Restrictions: No   Pertinent Vitals/Pain 6. premedicated    ADL  Grooming: Supervision/safety Where Assessed - Grooming: Unsupported standing Upper Body Bathing: Supervision/safety;Set up Where Assessed - Upper Body Bathing: Supported sitting Lower Body Bathing: Moderate assistance Where Assessed - Lower Body Bathing: Supported sit to stand Upper Body Dressing: Moderate assistance Where Assessed - Upper Body Dressing: Supine, head of bed flat;Rolling right and/or left Lower Body Dressing: Minimal assistance Where Assessed - Lower Body Dressing: Supported sit to stand;Other (comment) (with AE) Toilet Transfer: Supervision/safety Toilet Transfer Method: Sit to Barista:  Materials engineer and Hygiene: Minimal assistance Where Assessed - Engineer, mining and Hygiene: Sit to stand from 3-in-1 or toilet Equipment Used: Back brace;Gait belt;Rolling walker;Sock aid;Reacher;Long-handled sponge;Long-handled shoe horn Transfers/Ambulation Related to ADLs: S RW level  Educated on donning/doffing brace by log rolling in bed. Educated on sequence for bathing/donning/doffing brace in supine and AE for hygiene after toileting. Educated on available AE for ADL.   OT Diagnosis: Generalized weakness;Acute pain  OT Problem List: Decreased strength;Decreased range of motion;Decreased activity tolerance;Decreased safety awareness;Decreased knowledge of use of DME or AE;Decreased knowledge of precautions;Pain OT Treatment Interventions: Self-care/ADL training;Therapeutic exercise;Energy conservation;DME and/or AE instruction;Therapeutic activities;Patient/family education   OT Goals Acute Rehab OT Goals OT Goal Formulation:  (re evl only)  Visit Information  Last OT Received On: 01/05/13 Assistance Needed: +1    Subjective Data      Prior Functioning     Home Living Lives With: Spouse Available Help at Discharge: Family;Available 24 hours/day Type of Home: House Home Access: Ramped entrance Entrance Stairs-Rails: Can reach both Home Layout: One level Bathroom Shower/Tub: Walk-in shower;Door [employer] Toilet: Standard Bathroom Accessibility: Yes How Accessible: Accessible via walker Home Adaptive Equipment: Shower chair without back;Walker - rolling;Straight cane Prior Function Level of Independence: Independent Able to Take Stairs?: Yes Driving: Yes Vocation: Retired Dominant Hand: Right         Vision/Perception     Cognition  Cognition Arousal/Alertness: Awake/alert Behavior During Therapy: WFL for tasks assessed/performed Overall Cognitive Status: Within Functional Limits for tasks assessed     Extremity/Trunk Assessment Right Upper Extremity Assessment RUE ROM/Strength/Tone: [facility] for tasks assessed Left Upper Extremity Assessment LUE ROM/Strength/Tone: WFL for tasks assessed Right Lower Extremity Assessment RLE ROM/Strength/Tone: Unable to fully assess;Due to pain RLE ROM/Strength/Tone Deficits: Prior to surgery MMT:  knee 4/5; hip 3+/5 limited by pain  RLE Sensation: History of peripheral neuropathy Left Lower Extremity Assessment LLE ROM/Strength/Tone: Unable to fully assess;Due to pain LLE ROM/Strength/Tone Deficits: Prior to surgery MMT:  knee 4/5; hip 3/5 limited by pain  LLE Sensation: History of peripheral neuropathy Trunk Assessment Trunk Assessment: Other exceptions (TLSO)     Mobility Bed Mobility Bed Mobility: Not assessed (Reviewed with OT) Rolling Right: 5: Supervision Rolling Left: 5: Supervision Right Sidelying to Sit: 5: Supervision Left Sidelying to Sit: 5: Supervision Supine to Sit: 5: Supervision Details for Bed Mobility Assistance: husband reurn demonstratred Transfers Sit to Stand: 5: Supervision;From bed;From toilet;With upper extremity assist Stand to Sit: 5: Supervision;To chair/3-in-1;To toilet;With upper extremity assist Details for Transfer Assistance: Supervision for safety with cues for hand placement     Exercise     Balance  S   End of Session OT - End of Session Equipment Utilized During Treatment: Back brace Activity Tolerance: Patient tolerated treatment well Patient left: in chair;with call bell/phone within reach;with family/visitor present Nurse Communication: Mobility status;Other (comment) (ready for D/C )  GO     Ohm Dentler,HILLARY 01/05/2013, 2:05 PM [facility], OTR/L  [phone] 01/05/2013

## 2013-01-05 NOTE — Discharge Summary (Signed)
Physician Discharge Summary  Michelle Cabrera ZOX:[id number] DOB: 24-Jun-1946 DOA: 12/26/2012  PCP: in [place]   Admit date: 12/26/2012 Discharge date: 01/05/2013  Time spent: 45 minutes  Recommendations for Outpatient Follow-up:  1. Ambulate with brace until seen by Dr. Newell Coral   Discharge Diagnoses:  Severe L1 compression fractur e- with uncontrollable pain  Hallucinations on narcotics History of old posterior fusion L2-L3 and L3-L4   DDD (degenerative disc disease)   Rheumatoid arthritis   Hypertension   Discharge Condition: good, pain free, ambulating with TLSO brace  Diet recommendation: regular   Filed Weights   12/26/12 2300  Weight: 68.2 kg (150 lb 5.7 oz)    History of present illness:  Michelle Cabrera is a 67 y.o. female with a history of Rheumatoid Arthritis and DDD who presents to the ED with complaints of severe intractable back pain. She reports having paithat has been worse over the past 3 days, and she went to the ED at [facility] 1 days ago and was seen and administered injections but still had no relief. She report s having increased pain and difficulty walking due to the pain. The pain is described as 10/10 shrap and burning pain that radiates from he back into both of her upper legs. She denies having any loss of bowel or bladder control.      Hospital Course:  1.1. Intractable Low Back Pain  - patient with multiple reasons for her symptoms including DDD LS spine and Herniated Disk L1-L2 on MRI and acute L1compression fracture. She also had  Postsurgical changes on MRI post L2-L4 laminectomy and PLIF. There is loosening of the L4 pedicle screws and posterior displacement of the interbody spacers, especially the L3-L4 spacer.   Dr Newell Coral recommended TLSO. Patient was started on oxycontin 20 mg po BID on 12/29/12 , with percocet 10/325 for breakthrough pain . Ibuprofen 400 mg TID added for adjuvant pain control on 12/30/12 By 12/31/12 patient c/o "worse back pain of  my life " - increased the oxycontin to 20 mg every 8 hours on 12/31/12. Patient was found to be too sedated on 01/02/13 (oxycontin changed back to q 12hrs) . She underwent kyphoplasty on June 9 with complete resolution of the acute back pain.  2. RA- Continue Methotrexate Rx,  3. HTN- Continue, Monitor BPS, PRN IV hydralazine.  4. Tobacco Use Disorder- Smoking Cessation on admission, continue Nicotine Patch.  5. Constipation - placed on senna and colace as well as mag citrate prn  6. Hallucinations from opiates - resolved after meds were discontinued.       Procedures:  L1 Kyphoplasty   Consultations:  NS - [facility]   Discharge Exam: Filed Vitals:   01/04/13 1115 01/04/13 1742 01/04/13 2101 01/05/13 0642  BP:  153/71 119/60 133/93  Pulse:  92 121 85  Temp: 98 F (36.7 C) 98.4 F (36.9 C) 98.6 F (37 C) 98.8 F (37.1 C)  TempSrc:   Oral Oral  Resp:  18 18 19   Height:      Weight:      SpO2:  94% 95% 95%    General: axox3 Cardiovascular: rrr Respiratory: ctab   Discharge Instructions  Discharge Orders   Future Orders Complete By Expires     Diet - low sodium heart healthy  As directed     Increase activity slowly  As directed     Scheduling Instructions:      Out of bed with brace only  Medication List    STOP taking these medications       oxyCODONE-acetaminophen 5-325 MG per tablet  Commonly known as:  PERCOCET/ROXICET     predniSONE 50 MG tablet  Commonly known as:  DELTASONE      TAKE these medications       amLODipine 5 MG tablet  Commonly known as:  NORVASC  Take 5 mg by mouth daily.     Biotin 5000 MCG Tabs  Take 5,000 mcg by mouth daily.     Calcium-Vitamin D-Minerals 600-400 MG-UNIT Chew  Chew 4 tablets by mouth daily.     cyclobenzaprine 5 MG tablet  Commonly known as:  FLEXERIL  Take 5 mg by mouth 2 (two) times daily as needed for muscle spasms.     DULoxetine 30 MG capsule  Commonly known as:  CYMBALTA  Take 30 mg by mouth 2  (two) times daily.     esomeprazole 40 MG capsule  Commonly known as:  NEXIUM  Take 40 mg by mouth daily before breakfast.     ibuprofen 400 MG tablet  Commonly known as:  ADVIL,MOTRIN  Take 1 tablet (400 mg total) by mouth every 8 (eight) hours as needed for pain.     Methotrexate (PF) 25 MG/0.4ML Soaj  Inject 50 mg into the skin once a week.     XELJANZ 5 MG Tabs  Generic drug:  Tofacitinib Citrate  Take 5 mg by mouth 2 (two) times daily.       Allergies  Allergen Reactions  . Dilaudid (Hydromorphone Hcl)     Made tongue swell  . Oxycodone     hallucinations       Follow-up Information   Follow up with Hewitt Shorts, MD. Schedule an appointment as soon as possible for a visit in 2 weeks.   Contact information:   [address] [place] [address] [phone]        The results of significant diagnostics from this hospitalization (including imaging, microbiology, ancillary and laboratory) are listed below for reference.    Significant Diagnostic Studies: Dg Thoracic Spine W/swimmers  12/27/2012   *RADIOLOGY REPORT*  Clinical Data: Evaluate scoliosis.  Back pain.  THORACIC SPINE - 2 VIEW + SWIMMERS  Comparison: None  Findings: There is diffuse osteopenia.  No fracture or malalignment in the thoracic spine. Slight wedged appearance of the L1 vertebral body.  See lumbar spine series and lumbar spine CT for further discussion.  Disc spaces are maintained.  IMPRESSION: No acute bony abnormality within the thoracic spine.  Diffuse osteopenia.  Slight wedged appearance of the L1 vertebral body.  See lumbar spine report.   Original Report Authenticated By: Charlett Nose, M.D.   Dg Lumbar Spine 2-3 Views  01/04/2013   *RADIOLOGY REPORT*  Clinical Data: L1 kyphoplasty.  DG C-ARM 1-60 MIN, LUMBAR SPINE - 2-3 VIEW  Comparison: Plain films lumbar spine 01/01/2013.  Findings: We are provided with two fluoroscopic intraoperative spot  views of the lumbar spine.  Images demonstrate new methylmethacrylate in the L1 vertebral body for a compression fracture deformity.  No acute abnormality is identified.  Spinal fusion hardware from L2-L4 is partially visualized.  IMPRESSION: L1 kyphoplasty.  No acute abnormality.   Original Report Authenticated By: Holley Dexter, M.D.   Dg Lumbar Spine 2-3 Views  01/01/2013   *RADIOLOGY REPORT*  Clinical Data: L1 fracture.  LUMBAR SPINE - 2-3 VIEW  Comparison: 12/30/2012.  Findings: Anterior wedge  compression fracture of the L1 vertebral body with mild retropulsion and with 20% loss of height.  Prior fusion L2-L4 without change. Please see CT 12/27/2012 report.  Mild levoscoliosis lumbar spine.  Lung parenchymal changes incompletely assessed on the present exam. Pleural effusions suspected.  Gas and stool filled slightly prominent size colon.  IMPRESSION: Anterior wedge compression fracture of the L1 vertebral body with mild retropulsion and with 20% loss of height.  Prior fusion L2-L4 without change. Please see CT 12/27/2012 report.  Lung parenchymal changes incompletely assessed on the present exam.  Pleural effusions.   Original Report Authenticated By: Lacy Duverney, M.D.   Dg Lumbar Spine 2-3 Views  12/30/2012   *RADIOLOGY REPORT*  Clinical Data: Assess L1 fracture.  LUMBAR SPINE - 2-3 VIEW  Comparison: 12/27/2012. CT lumbar spine 12/27/2012.  Findings: L1 superior endplate compression fracture appears minimally progressive, now measuring 2.1 cm in height anteriorly versus 2.6 cm on 12/27/2012.  Alignment is anatomic.  L2-4 posterior lumbar interbody fusion is unchanged. Posterior displacement of the L3-4 interbody spacer is again noted.  Posterior displacement of the L2-3 interbody spacer and loosening of the L4 pedicle screws are better evaluated on CT examination performed 3 days ago.  Facet hypertrophy L5-S1.  IMPRESSION:  1.  Slight progression in L1 superior endplate compression fracture. 2.  L2-4  PLIF, as above, better evaluated on cross-sectional imaging performed 12/27/2012.   Original Report Authenticated By: Leanna Battles, M.D.   Dg Lumbar Spine Complete W/bend  12/27/2012   *RADIOLOGY REPORT*  Clinical Data: Low back pain.  History of lumbar fusion.  LUMBAR SPINE - COMPLETE WITH BENDING VIEWS  Comparison: Lumbar spine CT performed today and MRI performed yesterday.  No remote prior studies.  Findings: The bones are diffusely demineralized.  The alignment is near anatomic.  There is a mild superior endplate compression fracture at L1 resulting in approximately 15% loss of vertebral body height.  No other fractures are identified.  Patient is status post laminectomy and PLIF from L2-L4.  There is loosening of the L4 pedicle screws, better seen on today's CT.  The interbody spacer at L3-L4 is posteriorly displaced by 5 mm with respect to the posterior cortex of the adjacent vertebral bodies. The markers for the L3-L4 interbody spacer also project slightly posterior to the posterior cortex of the adjacent vertebral bodies.  Flexion and extension views demonstrate limited range of motion, but no gross instability.  IMPRESSION:  1.  Mild superior endplate compression deformity at L1. 2.  Prior laminectomy and PLIF from L2-L4.  There is posterior displacement of the interbody spacers, especially the more inferior one.  Some loosening of the L4 pedicle screws is more obvious on CT.  See separate report. 3.  No dynamic instability identified.   Original Report Authenticated By: Carey Bullocks, M.D.   Ct Lumbar Spine Wo Contrast  12/27/2012   *RADIOLOGY REPORT*  Clinical Data: Low back pain.  Evaluate lumbar fusion.  CT LUMBAR SPINE WITHOUT CONTRAST  Technique:  Multidetector CT imaging of the lumbar spine was performed without intravenous contrast administration. Multiplanar CT image reconstructions were also generated.  Comparison: Lumbar MRI 12/26/2012.  No remote prior studies available.  Findings: There  are five lumbar type vertebral bodies.  The alignment is near anatomic. There is a mild superior endplate compression deformity at L1 associated with irregularity of the anterior cortex.  This is a subtle finding on yesterday's MRI, but is suspected to reflect an acute fracture.  No other fractures are  identified.  The patient is status post laminectomy and PLIF from L2-L4.  There is some lucency surrounding the L4 pedicle screws, especially on the left.  No loosening of the other pedicle screws is demonstrated.  The interbody spacer at L3-L4 creates significant artifact, but protrudes approximately 5 mm posterior to the posterior cortex of the adjacent vertebral bodies.  The L2-L3 interbody spacer also projects posterior to the dorsal cortex of the adjacent vertebral bodies.  L1-L2:  There is annular disc bulging with a broad-based foraminal extraforaminal disc protrusion on the right.  There is mild right lateral recess and right foraminal stenosis.  L2-L3:  Postsurgical changes as above.  Mild right foraminal stenosis.  L3-L4:  Postsurgical changes as above with posterior displacement of the interbody spacer.  L4-L5:  There is annular disc bulging eccentric to the left with mild facet and ligamentous hypertrophy.  Mild narrowing of the left lateral recess.  L5-S1:  Moderate facet hypertrophy.  No spinal stenosis or nerve root encroachment.  IMPRESSION:  1.  Mild superior endplate compression fracture at L1 has an appearance most consistent with an acute fracture.  The posterior cortex is intact. 2.  Postsurgical changes status post L2-L4 laminectomy and PLIF. There is loosening of the L4 pedicle screws and posterior displacement of the interbody spacers, especially the L3-L4 spacer. 3.  Mild spondylosis at the additional levels as detailed above. No high-grade foraminal stenosis.   Original Report Authenticated By: Carey Bullocks, M.D.   Mr Lumbar Spine Wo Contrast  12/27/2012   **ADDENDUM** CREATED: 12/27/2012  20:31:22  Upon review of subsequent CT scan, it is more evident that a transverse fracture is present through the L1 vertebral body with slight loss of height.  This fracture does not involve the posterior elements.  The fracture is incompletely healed.  **END ADDENDUM** SIGNED BY: Chauncey Fischer, M.D.  12/26/2012   *RADIOLOGY REPORT*  Clinical Data: Weakness.  Lumbar disc disease.  Status post epidural injection 12/10/2012 with continued progression of symptoms.  Low back pain extending into the left lower extremity.  MRI LUMBAR SPINE WITHOUT CONTRAST  Technique:  Multiplanar and multiecho pulse sequences of the lumbar spine were obtained without intravenous contrast.  Comparison: None.  Findings: Normal signal is present in the conus medullaris which terminates at T12-L1, within normal limits.  The patient is status post posterior fusion at L2-3 and L3-4.  Edematous end plate marrow changes are present on the right at L1-2.  Leftward curvature of the lumbar spine is centered at L2-3.  The vertebral body heights and AP alignment are normal.  Limited imaging of the abdomen is unremarkable.  A benign appearing fluid collection is evident to below the subcutaneous fat at the fascial margin.  The disc levels at T12 and above are normal.  L1-2:  A rightward disc herniation is present.  Mild facet hypertrophy is noted.  This results in mild right lateral recess and foraminal narrowing.  L2-3:  The disc spacer is at the posterior margin of the vertebral body.  Mild right lateral recess and foraminal narrowing is present.  L3-4:  Patient is status post laminectomy.  The central canal and foramina are widely patent.  L4-5:  A mild leftward disc herniation is present.  Mild left foraminal narrowing is evident.  L5-S1:  Moderate facet hypertrophy is worse on the right.  There is no significant stenosis.  IMPRESSION:  1.  Rightward disc herniation at L1-2 in concert with facet hypertrophy results in mild right lateral  recess and foraminal narrowing. 2.  Mild right lateral recess and foraminal narrowing at L2-3 with slight posterior displacement of the disc spacer. 3.  Status post laminectomy at L3-4 without evidence for residual or recurrent stenosis. 4.  Mild left foraminal narrowing at L4-5. 5.  Asymmetric right-sided facet arthropathy at L5-S1 without significant stenosis.   Original Report Authenticated By: Marin Roberts, M.D.   Dg C-arm 1-60 Min  01/04/2013   *RADIOLOGY REPORT*  Clinical Data: L1 kyphoplasty.  DG C-ARM 1-60 MIN, LUMBAR SPINE - 2-3 VIEW  Comparison: Plain films lumbar spine 01/01/2013.  Findings: We are provided with two fluoroscopic intraoperative spot views of the lumbar spine.  Images demonstrate new methylmethacrylate in the L1 vertebral body for a compression fracture deformity.  No acute abnormality is identified.  Spinal fusion hardware from L2-L4 is partially visualized.  IMPRESSION: L1 kyphoplasty.  No acute abnormality.   Original Report Authenticated By: Holley Dexter, M.D.    Microbiology: Recent Results (from the past 240 hour(s))  MRSA PCR SCREENING     Status: None   Collection Time    01/04/13  8:51 AM      Result Value Range Status   MRSA by PCR NEGATIVE  NEGATIVE Final   Comment:            The GeneXpert MRSA Assay (FDA     approved for NASAL specimens     only), is one component of a     comprehensive MRSA colonization     surveillance program. It is not     intended to diagnose MRSA     infection nor to guide or     monitor treatment for     MRSA infections.     Labs: Basic Metabolic Panel:  Recent Labs Lab 01/04/13 0345 01/05/13 0415  NA 139 138  K 4.3 3.7  CL 102 103  CO2 28 26  GLUCOSE 84 83  BUN 10 10  CREATININE 0.61 0.55  CALCIUM 9.1 8.8   Liver Function Tests: No results found for this basename: AST, ALT, ALKPHOS, BILITOT, PROT, ALBUMIN,  in the last 168 hours No results found for this basename: LIPASE, AMYLASE,  in the last 168  hours No results found for this basename: AMMONIA,  in the last 168 hours CBC:  Recent Labs Lab 01/04/13 0345  WBC 7.7  HGB 12.7  HCT 38.3  MCV 95.0  PLT 414*   Cardiac Enzymes: No results found for this basename: CKTOTAL, CKMB, CKMBINDEX, TROPONINI,  in the last 168 hours BNP: BNP (last 3 results) No results found for this basename: PROBNP,  in the last 8760 hours CBG: No results found for this basename: GLUCAP,  in the last 168 hours     Signed:  Jenaveve Fenstermaker  [facility] 01/05/2013, 10:41 AM

## 2013-01-05 NOTE — Evaluation (Signed)
Physical Therapy Re-Evaluation Patient Details Name: Michelle Cabrera MRN: [id number] DOB: 02-14-1946 Today's Date: 01/05/2013 Time: 3086-5784 PT Time Calculation (min): 10 min  PT Assessment / Plan / Recommendation Clinical Impression  Pt is 67 y/o female admitted 12/26/12 for back pain.  Pt with s/p L1 kyphoplasty on 01/04/13.  Pt moving well and ready to d/c home.  Will conitnue to follow if not d/c home today.      PT Assessment  Patient needs continued PT services    Follow Up Recommendations  Home health PT    Barriers to Discharge None      Equipment Recommendations  None recommended by PT    Frequency Min 5X/week    Precautions / Restrictions Precautions Precautions: Back;Fall Precaution Booklet Issued: Yes (comment) Precaution Comments: Pt able to recall back precautions Required Braces or Orthoses: Spinal Brace Spinal Brace: Thoracolumbosacral orthotic;Applied in sitting position Restrictions Weight Bearing Restrictions: No   Pertinent Vitals/Pain Mild c/o back pain but does not rate      Mobility  Bed Mobility Bed Mobility: Not assessed (Reviewed with OT) Transfers Transfers: Sit to Stand;Stand to Sit Sit to Stand: 5: Supervision;From bed;From toilet;With upper extremity assist Stand to Sit: 5: Supervision;To chair/3-in-1;To toilet;With upper extremity assist Details for Transfer Assistance: Supervision for safety with cues for hand placement Ambulation/Gait Ambulation/Gait Assistance: 6: Modified independent (Device/Increase time) Ambulation Distance (Feet): 200 Feet Assistive device: Rolling walker Gait Pattern: Step-through pattern;Decreased stride length Gait velocity: Steady pace. Stairs: No (Has ramp enterance)    Exercises     PT Diagnosis: Difficulty walking;Acute pain  PT Problem List: Decreased strength;Decreased range of motion;Decreased activity tolerance;Decreased balance;Decreased mobility;Pain PT Treatment Interventions: DME  instruction;Stair training;Gait training;Functional mobility training;Therapeutic activities;Balance training;Neuromuscular re-education;Patient/family education   PT Goals Acute Rehab PT Goals PT Goal Formulation: With patient Time For Goal Achievement: 01/12/13 Potential to Achieve Goals: Good Pt will go Supine/Side to Sit: with modified independence PT Goal: Supine/Side to Sit - Progress: Goal set today Pt will go Sit to Supine/Side: with modified independence PT Goal: Sit to Supine/Side - Progress: Goal set today Pt will go Sit to Stand: with modified independence PT Goal: Sit to Stand - Progress: Goal set today Pt will go Stand to Sit: with modified independence PT Goal: Stand to Sit - Progress: Goal set today Pt will Ambulate: >150 feet;with modified independence;with least restrictive assistive device PT Goal: Ambulate - Progress: Goal set today Pt will Go Up / Down Stairs: 3-5 stairs PT Goal: Up/Down Stairs - Progress: Goal set today  Visit Information  Last PT Received On: 01/05/13 Assistance Needed: +1    Subjective Data  Subjective: "I'm ready to go home." Patient Stated Goal: To go home   Prior Functioning  Home Living Lives With: Spouse Available Help at Discharge: Family;Available 24 hours/day Type of Home: House Home Access: Ramped entrance Entrance Stairs-Rails: Can reach both Home Layout: One level Bathroom Shower/Tub: Walk-in shower;Door [employer] Toilet: Standard Bathroom Accessibility: Yes How Accessible: Accessible via walker Home Adaptive Equipment: Shower chair without back;Walker - rolling;Straight cane Prior Function Level of Independence: Independent Able to Take Stairs?: Yes Driving: Yes Vocation: Retired Dominant Hand: Right    Cognition  Cognition Arousal/Alertness: Awake/alert Behavior During Therapy: WFL for tasks assessed/performed Overall Cognitive Status: Within Functional Limits for tasks assessed    Extremity/Trunk Assessment  Right Upper Extremity Assessment RUE ROM/Strength/Tone: [facility] for tasks assessed Left Upper Extremity Assessment LUE ROM/Strength/Tone: WFL for tasks assessed Right Lower Extremity Assessment RLE ROM/Strength/Tone: Unable to fully assess;Due  to pain RLE ROM/Strength/Tone Deficits: Prior to surgery MMT:  knee 4/5; hip 3+/5 limited by pain  RLE Sensation: History of peripheral neuropathy Left Lower Extremity Assessment LLE ROM/Strength/Tone: Unable to fully assess;Due to pain LLE ROM/Strength/Tone Deficits: Prior to surgery MMT:  knee 4/5; hip 3/5 limited by pain  LLE Sensation: History of peripheral neuropathy   Balance    End of Session PT - End of Session Equipment Utilized During Treatment: Gait belt;Back brace Activity Tolerance: Patient tolerated treatment well Patient left: in chair;with call bell/phone within reach;with family/visitor present Nurse Communication: Mobility status  GP     Michelle Cabrera 01/05/2013, 11:43 AM Jake Shark, PT DPT [phone]

## 2013-01-06 NOTE — Progress Notes (Signed)
   CARE MANAGEMENT NOTE 01/06/2013  Patient:  RUCHEL, BRANDENBURGER   Account Number:  [id number]  Date Initiated:  01/01/2013  Documentation initiated by:  [facility]  Subjective/Objective Assessment:   L1compression fracture and Postsurgical changes status post L2-L4 laminectomy and PLIF. There is loosening of the L4 pedicle screws and posterior     Action/Plan:   HH PT recommended   Anticipated DC Date:  01/02/2013   Anticipated DC Plan:  HOME W HOME HEALTH SERVICES      DC Planning Services  CM consult      [facility] Choice  HOME HEALTH   Choice offered to / List presented to:  C-1 Patient        HH arranged  HH-2 PT  HH-3 OT      [facility] agency  [facility]   Status of service:  Completed, signed off Medicare Important Message given?   (If response is "NO", the following Medicare IM given date fields will be blank) Date Medicare IM given:   Date Additional Medicare IM given:    Discharge Disposition:  HOME W HOME HEALTH SERVICES  Per UR Regulation:    If discussed at Long Length of Stay Meetings, dates discussed:    Comments:  01/06/2013 0900 NCM spoke to pt and she had [facility] in the past. Agreeable to [facility] or [facility] if agency unable to take referral. Contacted [facility] and they cannot take referral. Referral sent to [facility] rep for [facility]. Pt is concerned about out of pocket cost for [facility]. Explained agency can run her benefits and give her the cost of copay for each visit. Isidoro Donning RN CCM Case Mgmt phone [phone]  01/05/2013 1700 Pt dc home. Contacted at home to arrange [facility]. Left message on voicemail. Isidoro Donning RN CCM Case Mgmt phone [phone]

## 2013-03-26 ENCOUNTER — Ambulatory Visit: Payer: Self-pay | Admitting: Internal Medicine

## 2013-05-07 ENCOUNTER — Ambulatory Visit: Payer: Self-pay | Admitting: Anesthesiology

## 2013-06-04 ENCOUNTER — Emergency Department (HOSPITAL_COMMUNITY): Payer: MEDICARE

## 2013-06-04 ENCOUNTER — Encounter (HOSPITAL_COMMUNITY): Payer: Self-pay | Admitting: Emergency Medicine

## 2013-06-04 ENCOUNTER — Emergency Department (HOSPITAL_COMMUNITY)
Admission: EM | Admit: 2013-06-04 | Discharge: 2013-06-04 | Disposition: A | Discharge status: Home / Self Care | Payer: MEDICARE | Attending: Emergency Medicine | Admitting: Emergency Medicine

## 2013-06-04 DIAGNOSIS — R52 Pain, unspecified: Secondary | ICD-10-CM | POA: Insufficient documentation

## 2013-06-04 DIAGNOSIS — F17200 Nicotine dependence, unspecified, uncomplicated: Secondary | ICD-10-CM | POA: Insufficient documentation

## 2013-06-04 DIAGNOSIS — M545 Low back pain, unspecified: Secondary | ICD-10-CM | POA: Insufficient documentation

## 2013-06-04 DIAGNOSIS — Z79899 Other long term (current) drug therapy: Secondary | ICD-10-CM | POA: Insufficient documentation

## 2013-06-04 DIAGNOSIS — Z8781 Personal history of (healed) traumatic fracture: Secondary | ICD-10-CM | POA: Insufficient documentation

## 2013-06-04 DIAGNOSIS — R109 Unspecified abdominal pain: Secondary | ICD-10-CM | POA: Insufficient documentation

## 2013-06-04 DIAGNOSIS — M069 Rheumatoid arthritis, unspecified: Secondary | ICD-10-CM | POA: Insufficient documentation

## 2013-06-04 DIAGNOSIS — G8929 Other chronic pain: Secondary | ICD-10-CM | POA: Insufficient documentation

## 2013-06-04 DIAGNOSIS — I10 Essential (primary) hypertension: Secondary | ICD-10-CM | POA: Insufficient documentation

## 2013-06-04 HISTORY — DX: Wedge compression fracture of first lumbar vertebra, initial encounter for closed fracture: S32.010A

## 2013-06-04 LAB — URINALYSIS W MICROSCOPIC + REFLEX CULTURE
Bilirubin Urine: NEGATIVE
Glucose, UA: NEGATIVE mg/dL
Hgb urine dipstick: NEGATIVE
Specific Gravity, Urine: 1.016 (ref 1.005–1.030)
Urobilinogen, UA: 1 mg/dL (ref 0.0–1.0)
pH: 6.5 (ref 5.0–8.0)

## 2013-06-04 MED ORDER — OXYCODONE-ACETAMINOPHEN 5-325 MG PO TABS: ORAL_TABLET | ORAL | Status: DC

## 2013-06-04 MED ORDER — FENTANYL CITRATE 0.05 MG/ML IJ SOLN
50.0000 ug | Freq: Once | INTRAMUSCULAR | Status: AC
  Administered 2013-06-04: 50 ug via INTRAMUSCULAR
  Filled 2013-06-04: qty 2

## 2013-06-04 MED ORDER — METHOCARBAMOL 500 MG PO TABS: 1000.0000 mg | ORAL_TABLET | Freq: Four times a day (QID) | ORAL | Status: DC | PRN

## 2013-06-04 NOTE — ED Provider Notes (Signed)
CSN: [id number]     Arrival date & time 06/04/13  1535 History   First MD Initiated Contact with Patient 06/04/13 1847     Chief Complaint  Patient presents with  . Back Pain    HPI Pt was seen at 1900. Per pt, c/o gradual onset and persistence of constant acute flair of her chronic low back "pain" for the past 4 years, worse over the past 4 months.  Denies any change in her usual chronic pain pattern. States her pain worsened when she ran out of her pain meds. Pain worsens with palpation of the area and body position changes. Denies incont/retention of bowel or bladder, no saddle anesthesia, no focal motor weakness, no tingling/numbness in extremities, no fevers, no injury, no abd pain.   The symptoms have been associated with no other complaints. The patient has a significant history of similar symptoms previously.    Past Medical History  Diagnosis Date  . Hypertension   . Rheumatoid arthritis   . DDD (degenerative disc disease)   . Chronic back pain   . Closed compression fracture of L1 lumbar vertebra 11/2012   Past Surgical History  Procedure Laterality Date  . Lumbar disc surgery       X 3  . Kyphoplasty N/A 01/04/2013    Procedure: L1 Kyphoplasty;  Surgeon: Hewitt Shorts, MD;  Location: [facility];  Service: Neurosurgery;  Laterality: N/A;  L1 Kyphoplasty   Family History  Problem Relation Age of Onset  . CAD Father   . CAD Mother   . Hypertension Mother   . Peripheral vascular disease Mother    History  Substance Use Topics  . Smoking status: Current Every Day Smoker  . Smokeless tobacco: Not on file  . Alcohol Use: No    Review of Systems ROS: Statement: All systems negative except as marked or noted in the HPI; Constitutional: Negative for fever and chills. ; ; Eyes: Negative for eye pain, redness and discharge. ; ; ENMT: Negative for ear pain, hoarseness, nasal congestion, sinus pressure and sore throat. ; ; Cardiovascular: Negative for chest pain,  palpitations, diaphoresis, dyspnea and peripheral edema. ; ; Respiratory: Negative for cough, wheezing and stridor. ; ; Gastrointestinal: Negative for nausea, vomiting, diarrhea, abdominal pain, blood in stool, hematemesis, jaundice and rectal bleeding. . ; ; Genitourinary: Negative for dysuria, flank pain and hematuria. ; ; Musculoskeletal: +LBP.  Negative for neck pain. Negative for swelling and trauma.; ; Skin: Negative for pruritus, rash, abrasions, blisters, bruising and skin lesion.; ; Neuro: Negative for headache, lightheadedness and neck stiffness. Negative for weakness, altered level of consciousness , altered mental status, extremity weakness, paresthesias, involuntary movement, seizure and syncope.     Allergies  Dilaudid and Oxycodone  Home Medications   Current Outpatient Rx  Name  Route  Sig  Dispense  Refill  . amLODipine (NORVASC) 5 MG tablet   Oral   Take 5 mg by mouth daily.         . Biotin 5000 MCG TABS   Oral   Take 5,000 mcg by mouth daily.         . Calcium Carbonate-Vit D-Min (CALCIUM-VITAMIN D-MINERALS) 600-400 MG-UNIT CHEW   Oral   Chew 4 tablets by mouth daily.         . DULoxetine (CYMBALTA) 30 MG capsule   Oral   Take 30-60 mg by mouth 2 (two) times daily. 60mg  in the morning and 30mg  in the evening         .  esomeprazole (NEXIUM) 40 MG capsule   Oral   Take 40 mg by mouth daily before breakfast.         . Etanercept 25 MG/0.5ML SOSY   Subcutaneous   Inject 25 mg into the skin once a week.         Marland Kitchen HYDROcodone-acetaminophen (NORCO) 10-325 MG per tablet   Oral   Take 1 tablet by mouth every 4 (four) hours as needed for moderate pain.         Marland Kitchen oxyCODONE-acetaminophen (PERCOCET/ROXICET) 5-325 MG per tablet   Oral   Take 1 tablet by mouth every 4 (four) hours as needed for severe pain.         . Methotrexate, PF, 25 MG/0.4ML SOAJ   Subcutaneous   Inject 50 mg into the skin once a week.          BP 148/74  Pulse 109   Temp(Src) 98.3 F (36.8 C) (Oral)  Resp 18  Ht 5\' 3"  (1.6 m)  Wt 140 lb (63.504 kg)  BMI 24.81 kg/m2  SpO2 95% Physical Exam 1905: Physical examination:  Nursing notes reviewed; Vital signs and O2 SAT reviewed;  Constitutional: Well developed, Well nourished, Well hydrated, In no acute distress; Head:  Normocephalic, atraumatic; Eyes: EOMI, PERRL, No scleral icterus; ENMT: Mouth and pharynx normal, Mucous membranes moist; Neck: Supple, Full range of motion, No lymphadenopathy; Cardiovascular: Regular rate and rhythm, No gallop; Respiratory: Breath sounds clear & equal bilaterally, No wheezes.  Speaking full sentences with ease, Normal respiratory effort/excursion; Chest: Nontender, Movement normal; Abdomen: Soft, Nontender, Nondistended, Normal bowel sounds; Genitourinary: No CVA tenderness; Spine:  No midline CS, TS, LS tenderness. +TTP right lumbar paraspinal muscles;; Extremities: Pulses normal, No tenderness, No edema, No calf edema or asymmetry.; Neuro: AA&Ox3, Major CN grossly intact.  Speech clear. Strength 5/5 equal bilat UE's and LE's, including great toe dorsiflexion. No gross sensory deficits.  Neg straight leg raises bilat. Climbs on and off stretcher easily by herself. Gait steady..; Skin: Color normal, Warm, Dry.   ED Course  Procedures     EKG Interpretation   None       MDM  MDM Reviewed: previous chart, nursing note and vitals Reviewed previous: CT scan Interpretation: CT scan and labs   Ct Abdomen Pelvis Wo Contrast 06/04/2013   CLINICAL DATA:  Right flank pain radiating to left flank.  EXAM: CT ABDOMEN AND PELVIS WITHOUT CONTRAST  TECHNIQUE: Multidetector CT imaging of the abdomen and pelvis was performed following the standard protocol without intravenous contrast.  COMPARISON:  CT of the lumbar spine December 27, 2012  FINDINGS: Limited view of the lung bases demonstrate dependent atelectasis. Included heart and pericardium are nonsuspicious.  The kidneys are well  located, with normal morphology, no hydronephrosis or nephrolithiasis. Limited assessment for renal masses on this nonenhanced examination. The unopacified ureters are normal in course and caliber. Urinary bladder is partially distended, slightly dense bladder contents.  Hepatic granuloma, mildly prominent left lobe of the liver. The spleen, gallbladder, pancreas and right adrenal gland are unremarkable. Mild nodular thickening of the left adrenal gland is unchanged.  Moderate to severe calcific atherosclerosis of the aorta. The stomach, small and large bowel are normal in course and caliber without inflammatory changes the sensitivity may be decreased by lack of enteric contrast. A few sigmoid diverticula seen. Normal appendix. No intraperitoneal free fluid nor free air. Internal acute reproductive organs are nonsuspicious.  Interval L1 vertebroplasty with mild progressive height loss. Again seen is  L2-3 interbody disc material without CT findings of bony fusion. L2-3 through L4-5 posterior instrumentation, the periprosthetic lucency at L4 described on prior CT is difficult to appreciate without bone windows.  IMPRESSION: No urolithiasis nor acute intra-abdominal nor pelvic process.  Interval L1 vertebroplasty, L2-3 through L4-5 PLIF would be better assessed on dedicated lumbar spine imaging as clinically indicated.   Electronically Signed   By: Awilda Metro   On: 06/04/2013 20:06     2030:  CT d/w Rads MD: hardware appears similar to previous imaging, no acute findings today. Pt has climbed of the stretcher by herself and got dressed without distress. Pt has ambulated around the ED with upright steady gait. Appears acute flair of her chronic LBP today, no red flags on HPI or PE. UA pending. Sign out to NP Manus Rudd.    Laray Anger, DO 06/06/13 1709

## 2013-06-04 NOTE — ED Provider Notes (Signed)
Urine results reviewed negative for infection.  Patient has been discharged per Dr. Clarene Duke.  His instructions, with chronic back pain  Arman Filter, NP 06/04/13 2129

## 2013-06-04 NOTE — ED Notes (Signed)
The pt is c/o lower back pain more sevier for the past few days.  She has had back pain for at least 4 years since her back surgeries started.  Her last surgery was this past july

## 2013-06-06 LAB — URINE CULTURE

## 2013-06-06 NOTE — ED Provider Notes (Signed)
Medical screening examination/treatment/procedure(s) were conducted as a shared visit with non-physician practitioner(s) and myself.  I personally evaluated the patient during the encounter. Please see my previous note.      Laray Anger, DO 06/06/13 1709

## 2013-06-09 ENCOUNTER — Ambulatory Visit: Payer: Self-pay | Admitting: Anesthesiology

## 2013-07-06 ENCOUNTER — Ambulatory Visit: Payer: Self-pay | Admitting: Anesthesiology

## 2013-07-21 ENCOUNTER — Ambulatory Visit: Payer: Self-pay | Admitting: Physician Assistant

## 2013-07-21 LAB — URINALYSIS, COMPLETE
Bilirubin,UR: NEGATIVE
Blood: NEGATIVE
Protein: 30
Specific Gravity: 1.01 (ref 1.003–1.030)

## 2013-07-21 LAB — RAPID INFLUENZA A&B ANTIGENS

## 2013-08-03 ENCOUNTER — Ambulatory Visit: Payer: Self-pay | Admitting: Anesthesiology

## 2013-10-25 ENCOUNTER — Ambulatory Visit: Payer: Self-pay | Admitting: Podiatry

## 2013-10-28 ENCOUNTER — Ambulatory Visit: Payer: Self-pay | Admitting: Anesthesiology

## 2013-12-15 DIAGNOSIS — M810 Age-related osteoporosis without current pathological fracture: Secondary | ICD-10-CM | POA: Insufficient documentation

## 2013-12-27 ENCOUNTER — Emergency Department (HOSPITAL_COMMUNITY): Payer: MEDICARE

## 2013-12-27 ENCOUNTER — Encounter (HOSPITAL_COMMUNITY): Payer: Self-pay | Admitting: Emergency Medicine

## 2013-12-27 ENCOUNTER — Emergency Department (HOSPITAL_COMMUNITY)
Admission: EM | Admit: 2013-12-27 | Discharge: 2013-12-27 | Disposition: A | Discharge status: Home / Self Care | Payer: MEDICARE | Attending: Emergency Medicine | Admitting: Emergency Medicine

## 2013-12-27 DIAGNOSIS — Z79899 Other long term (current) drug therapy: Secondary | ICD-10-CM | POA: Insufficient documentation

## 2013-12-27 DIAGNOSIS — G8929 Other chronic pain: Secondary | ICD-10-CM | POA: Insufficient documentation

## 2013-12-27 DIAGNOSIS — R52 Pain, unspecified: Secondary | ICD-10-CM | POA: Insufficient documentation

## 2013-12-27 DIAGNOSIS — Z8739 Personal history of other diseases of the musculoskeletal system and connective tissue: Secondary | ICD-10-CM | POA: Insufficient documentation

## 2013-12-27 DIAGNOSIS — Z8781 Personal history of (healed) traumatic fracture: Secondary | ICD-10-CM | POA: Insufficient documentation

## 2013-12-27 DIAGNOSIS — Z9889 Other specified postprocedural states: Secondary | ICD-10-CM | POA: Insufficient documentation

## 2013-12-27 DIAGNOSIS — F17200 Nicotine dependence, unspecified, uncomplicated: Secondary | ICD-10-CM | POA: Insufficient documentation

## 2013-12-27 DIAGNOSIS — IMO0001 Reserved for inherently not codable concepts without codable children: Secondary | ICD-10-CM | POA: Insufficient documentation

## 2013-12-27 DIAGNOSIS — I10 Essential (primary) hypertension: Secondary | ICD-10-CM | POA: Insufficient documentation

## 2013-12-27 DIAGNOSIS — M549 Dorsalgia, unspecified: Secondary | ICD-10-CM | POA: Insufficient documentation

## 2013-12-27 LAB — URINALYSIS, ROUTINE W REFLEX MICROSCOPIC
BILIRUBIN URINE: NEGATIVE
Glucose, UA: NEGATIVE mg/dL
HGB URINE DIPSTICK: NEGATIVE
KETONES UR: NEGATIVE mg/dL
NITRITE: NEGATIVE
PH: 5 (ref 5.0–8.0)
Protein, ur: NEGATIVE mg/dL
Specific Gravity, Urine: 1.017 (ref 1.005–1.030)
Urobilinogen, UA: 0.2 mg/dL (ref 0.0–1.0)

## 2013-12-27 LAB — URINE MICROSCOPIC-ADD ON

## 2013-12-27 MED ORDER — ONDANSETRON HCL 4 MG/2ML IJ SOLN
4.0000 mg | Freq: Once | INTRAMUSCULAR | Status: AC
  Administered 2013-12-27: 4 mg via INTRAVENOUS
  Filled 2013-12-27: qty 2

## 2013-12-27 MED ORDER — HYDROMORPHONE HCL PF 1 MG/ML IJ SOLN
1.0000 mg | Freq: Once | INTRAMUSCULAR | Status: AC
  Administered 2013-12-27: 1 mg via INTRAVENOUS
  Filled 2013-12-27: qty 1

## 2013-12-27 MED ORDER — FENTANYL CITRATE 0.05 MG/ML IJ SOLN
50.0000 ug | Freq: Once | INTRAMUSCULAR | Status: AC
  Administered 2013-12-27: 50 ug via INTRAVENOUS
  Filled 2013-12-27: qty 2

## 2013-12-27 MED ORDER — FENTANYL CITRATE 0.05 MG/ML IJ SOLN
100.0000 ug | Freq: Once | INTRAMUSCULAR | Status: AC
  Administered 2013-12-27: 100 ug via INTRAVENOUS
  Filled 2013-12-27: qty 2

## 2013-12-27 NOTE — ED Notes (Addendum)
C/o low back pain radiates down both legs, right leg has numbness and tingling. Has had 4 back surgeries-- 3 at [facility], June 10th , 2014--here by Dr. Newell Coral. Does see pain management dr at [facility].

## 2013-12-27 NOTE — ED Provider Notes (Addendum)
CSN: [id number]     Arrival date & time 12/27/13  1327 History   First MD Initiated Contact with Patient 12/27/13 1600     Chief Complaint  Patient presents with  . Back Pain  . Sciatica     (Consider location/radiation/quality/duration/timing/severity/associated sxs/prior Treatment) HPI Comments: Patient presents with acute on chronic exacerbation of her chronic back pain. She states she's had multiple previous back surgeries last one year ago by Dr. Newell Coral.  She normally uses a fentanyl patch for her pain over the past several days her pain is uncontrolled. It is in the same location as her back pain previously but worse in intensity. It radiates down both of her legs and is associated with numbness and tingling. Denies any fever, chills, nausea, vomiting, chest pain or shortness of breath and vomiting. She denies any bowel or bladder incontinence. Her husband states she lays in bed most of the time. She is able to climb on and off the stretcher by herself today. She reports paraspinal pain radiating down both legs in a similar distribution to her chronic pain.  The history is provided by the patient and a relative.    Past Medical History  Diagnosis Date  . Hypertension   . Rheumatoid arthritis   . DDD (degenerative disc disease)   . Chronic back pain   . Closed compression fracture of L1 lumbar vertebra 11/2012   Past Surgical History  Procedure Laterality Date  . Lumbar disc surgery       X 3  . Kyphoplasty N/A 01/04/2013    Procedure: L1 Kyphoplasty;  Surgeon: Hewitt Shorts, MD;  Location: [facility];  Service: Neurosurgery;  Laterality: N/A;  L1 Kyphoplasty  . Tubal ligation    . Foot surgery     Family History  Problem Relation Age of Onset  . CAD Father   . CAD Mother   . Hypertension Mother   . Peripheral vascular disease Mother    History  Substance Use Topics  . Smoking status: Current Every Day Smoker -- 0.50 packs/day  . Smokeless tobacco: Not on file  .  Alcohol Use: No   OB History   Grav Para Term Preterm Abortions TAB SAB Ect Mult Living                 Review of Systems  Constitutional: Negative for fever, activity change and appetite change.  HENT: Negative for congestion and rhinorrhea.   Eyes: Negative for photophobia.  Respiratory: Negative for cough, chest tightness and shortness of breath.   Cardiovascular: Negative for chest pain.  Gastrointestinal: Negative for nausea, vomiting and abdominal pain.  Genitourinary: Negative for dysuria, hematuria, vaginal bleeding and vaginal discharge.  Musculoskeletal: Positive for arthralgias, back pain and myalgias.  Skin: Negative for rash.  Neurological: Negative for dizziness, weakness and headaches.  A complete 10 system review of systems was obtained and all systems are negative except as noted in the HPI and PMH.      Allergies  Dilaudid and Oxycodone  Home Medications   Prior to Admission medications   Medication Sig Start Date End Date Taking? Authorizing Provider  Calcium Carbonate-Vit D-Min (CALCIUM-VITAMIN D-MINERALS) 600-400 MG-UNIT CHEW Chew 1 tablet by mouth 2 (two) times daily.    Yes Historical Provider, MD  DULoxetine (CYMBALTA) 30 MG capsule Take 30-60 mg by mouth 2 (two) times daily. Take 60 mg in the morning and 30 mg at night   Yes Historical Provider, MD  Etanercept 25 MG/0.5ML SOSY  Inject 25 mg into the skin once a week. On monday   Yes Historical Provider, MD  fentaNYL (DURAGESIC - DOSED MCG/HR) 50 MCG/HR Place 50 mcg onto the skin every 3 (three) days.   Yes Historical Provider, MD  furosemide (LASIX) 20 MG tablet Take 20 mg by mouth daily as needed for edema.   Yes Historical Provider, MD  gabapentin (NEURONTIN) 300 MG capsule Take 300 mg by mouth 3 (three) times daily.   Yes Historical Provider, MD  Methotrexate, PF, 25 MG/0.4ML SOAJ Inject 50 mg into the skin once a week. On Monday   Yes Historical Provider, MD  omeprazole (PRILOSEC) 20 MG capsule Take 20  mg by mouth daily.   Yes Historical Provider, MD  potassium chloride (K-DUR) 10 MEQ tablet Take 10 mEq by mouth daily.   Yes Historical Provider, MD  torsemide (DEMADEX) 5 MG tablet Take 5 mg by mouth daily.   Yes Historical Provider, MD  traZODone (DESYREL) 50 MG tablet Take 50-100 mg by mouth at bedtime as needed for sleep.   Yes Historical Provider, MD  zoledronic acid (RECLAST) 5 MG/100ML SOLN injection Inject 5 mg into the vein once.   Yes Historical Provider, MD   BP 108/60  Pulse 90  Temp(Src) 98.6 F (37 C) (Oral)  Resp 18  SpO2 98% Physical Exam  Nursing note and vitals reviewed. Constitutional: She is oriented to person, place, and time. She appears well-developed and well-nourished. No distress.  Uncomfortable  HENT:  Head: Normocephalic and atraumatic.  Mouth/Throat: Oropharynx is clear and moist.  Eyes: Conjunctivae and EOM are normal. Pupils are equal, round, and reactive to light.  Neck: Normal range of motion. Neck supple.  Cardiovascular: Normal rate, regular rhythm and normal heart sounds.   No murmur heard. Pulmonary/Chest: Effort normal and breath sounds normal. No respiratory distress.  Abdominal: Soft. There is no tenderness. There is no rebound and no guarding.  Musculoskeletal: Normal range of motion. She exhibits no edema and no tenderness.  5/5 strength in bilateral lower extremities. Ankle plantar and dorsiflexion intact. Great toe extension intact bilaterally. +2 DP and PT pulses. +2 patellar reflex on R, unable to illicit on L . Normal gait.  Paraspinal lumbar tenderness bilaterally with well-healed midline incision   Neurological: She is alert and oriented to person, place, and time. No cranial nerve deficit. She exhibits normal muscle tone. Coordination normal.  Skin: Skin is warm. No rash noted.    ED Course  Procedures (including critical care time) Labs Review Labs Reviewed  URINALYSIS, ROUTINE W REFLEX MICROSCOPIC - Abnormal; Notable for the  following:    APPearance CLOUDY (*)    Leukocytes, UA MODERATE (*)    All other components within normal limits  URINE MICROSCOPIC-ADD ON - Abnormal; Notable for the following:    Squamous Epithelial / LPF FEW (*)    All other components within normal limits    Imaging Review Mr Lumbar Spine Wo Contrast  12/27/2013   CLINICAL DATA:  Back pain with incontinence and weakness.  EXAM: MRI LUMBAR SPINE WITHOUT CONTRAST  TECHNIQUE: Multiplanar, multisequence MR imaging of the lumbar spine was performed. No intravenous contrast was administered.  COMPARISON:  CT abdomen and pelvis 06/04/2013. MRI lumbar spine 12/26/2012. CT of the lumbar spine 12/27/2012.  FINDINGS: Since the previous MR, the patient has undergone vertebral augmentation of L1. There is mild wedging of L1 which is not significantly progressed. There is no retropulsion of bone at this level. No new compression deformity is  seen.  The patient is status post L2 through L4 posterior and interbody fusion. 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 Interbody spacers at L2-3 and L3-4 are slightly dorsal to the epicenter of the interspace but not frankly extruded in the canal. No intraspinal mass lesion is evident. Normal-appearing conus. Unremarkable paravertebral soft tissues.  At L1-L2, there is a central and rightward protrusion. This narrows the lateral recess and foramen. This likely affects the right L2 and L1 nerve roots.  There is mild stenosis at L2-L3 with foraminal narrowing particularly on the right. Right L2 and right L3 nerve root impingement are possible.  Mild stenosis is present at L3-L4 without definite neural impingement.  The L4-5 disc space demonstrates a foraminal protrusion on the left. There is mild to moderate facet arthropathy.  At L5-S1, the disc appears fairly normal. Bilateral facet arthropathy is noted.  IMPRESSION: No acute abnormalities are  evident. No intraspinal mass lesion is seen.  No areas of Critical spinal stenosis which might account for incontinence or leg weakness.  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  Stable appearing L1 vertebral body post vertebral augmentation.   Electronically Signed   By: Davonna Belling M.D.   On: 12/27/2013 19:28     EKG Interpretation None      MDM   Final diagnoses:  Chronic back pain   Acute on chronic back pain in similar location. No focal weakness on exam. No incontinence. Asymmetric reflexes of the patellas.  On exam patient has equal strength in her lower extremities. Equal sensation. Pulses are intact. No incontinence.  MRI is stable without acute abnormalities. There is no spinal stenosis. Previous fusion is stable. No AAA on CT 11/14.  Patient is able to ambulate without assistance. She continues to have pain but states it is improved. She has an appointment with the pain management clinic in 3 days. I advised her that we will not change her chronic pain regimen which he is to inform her pain Dr. that her regimen is ineffective.  BP 108/60  Pulse 90  Temp(Src) 98.6 F (37 C) (Oral)  Resp 18  SpO2 98%     Glynn Octave, MD 12/27/13 2356  Glynn Octave, MD 12/28/13 [phone]

## 2013-12-27 NOTE — ED Notes (Signed)
Ambulated pt, she did fairly okay. She was in a lot of pain.

## 2013-12-27 NOTE — Discharge Instructions (Signed)
Back Pain, Adult Followup with your pain management doctor as scheduled. Let him know your pain regimen is not working. Return to the ED if you develop new or worsening symptoms. Low back pain is very common. About 1 in 5 people have back pain.The cause of low back pain is rarely dangerous. The pain often gets better over time.About half of people with a sudden onset of back pain feel better in just 2 weeks. About 8 in 10 people feel better by 6 weeks.  CAUSES Some common causes of back pain include:  Strain of the muscles or ligaments supporting the spine.  Wear and tear (degeneration) of the spinal discs.  Arthritis.  Direct injury to the back. DIAGNOSIS Most of the time, the direct cause of low back pain is not known.However, back pain can be treated effectively even when the exact cause of the pain is unknown.Answering your caregiver's questions about your overall health and symptoms is one of the most accurate ways to make sure the cause of your pain is not dangerous. If your caregiver needs more information, he or she may order lab work or imaging tests (X-rays or MRIs).However, even if imaging tests show changes in your back, this usually does not require surgery. HOME CARE INSTRUCTIONS For many people, back pain returns.Since low back pain is rarely dangerous, it is often a condition that people can learn to [facility] their own.   Remain active. It is stressful on the back to sit or stand in one place. Do not sit, drive, or stand in one place for more than 30 minutes at a time. Take short walks on level surfaces as soon as pain allows.Try to increase the length of time you walk each day.  Do not stay in bed.Resting more than 1 or 2 days can delay your recovery.  Do not avoid exercise or work.Your body is made to move.It is not dangerous to be active, even though your back may hurt.Your back will likely heal faster if you return to being active before your pain is gone.  Pay  attention to your body when you bend and lift. Many people have less discomfortwhen lifting if they bend their knees, keep the load close to their bodies,and avoid twisting. Often, the most comfortable positions are those that put less stress on your recovering back.  Find a comfortable position to sleep. Use a firm mattress and lie on your side with your knees slightly bent. If you lie on your back, put a pillow under your knees.  Only take over-the-counter or prescription medicines as directed by your caregiver. Over-the-counter medicines to reduce pain and inflammation are often the most helpful.Your caregiver may prescribe muscle relaxant drugs.These medicines help dull your pain so you can more quickly return to your normal activities and healthy exercise.  Put ice on the injured area.  Put ice in a plastic bag.  Place a towel between your skin and the bag.  Leave the ice on for 15-20 minutes, 03-04 times a day for the first 2 to 3 days. After that, ice and heat may be alternated to reduce pain and spasms.  Ask your caregiver about trying back exercises and gentle massage. This may be of some benefit.  Avoid feeling anxious or stressed.Stress increases muscle tension and can worsen back pain.It is important to recognize when you are anxious or stressed and learn ways to manage it.Exercise is a great option. SEEK MEDICAL CARE IF:  You have pain that is not relieved  with rest or medicine.  You have pain that does not improve in 1 week.  You have new symptoms.  You are generally not feeling well. SEEK IMMEDIATE MEDICAL CARE IF:   You have pain that radiates from your back into your legs.  You develop new bowel or bladder control problems.  You have unusual weakness or numbness in your arms or legs.  You develop nausea or vomiting.  You develop abdominal pain.  You feel faint. Document Released: 07/15/2005 Document Revised: 01/14/2012 Document Reviewed:  12/03/2010 [facility] Patient Information 2014 [place], [place].

## 2014-01-10 ENCOUNTER — Ambulatory Visit: Payer: Self-pay | Admitting: Anesthesiology

## 2014-01-11 ENCOUNTER — Ambulatory Visit: Payer: Self-pay | Admitting: Rheumatology

## 2014-01-17 ENCOUNTER — Ambulatory Visit: Payer: Self-pay

## 2014-02-01 ENCOUNTER — Other Ambulatory Visit: Payer: Self-pay | Admitting: Neurosurgery

## 2014-02-02 ENCOUNTER — Encounter (HOSPITAL_COMMUNITY): Payer: Self-pay | Admitting: Pharmacy Technician

## 2014-02-04 ENCOUNTER — Encounter (HOSPITAL_COMMUNITY)
Admission: RE | Admit: 2014-02-04 | Discharge: 2014-02-04 | Disposition: A | Discharge status: Home / Self Care | Payer: MEDICARE | Source: Ambulatory Visit | Attending: Neurosurgery | Admitting: Neurosurgery

## 2014-02-04 ENCOUNTER — Encounter (HOSPITAL_COMMUNITY): Payer: Self-pay

## 2014-02-04 DIAGNOSIS — Z87891 Personal history of nicotine dependence: Secondary | ICD-10-CM | POA: Diagnosis not present

## 2014-02-04 DIAGNOSIS — Z01810 Encounter for preprocedural cardiovascular examination: Secondary | ICD-10-CM | POA: Diagnosis not present

## 2014-02-04 DIAGNOSIS — K219 Gastro-esophageal reflux disease without esophagitis: Secondary | ICD-10-CM | POA: Diagnosis not present

## 2014-02-04 DIAGNOSIS — I10 Essential (primary) hypertension: Secondary | ICD-10-CM | POA: Diagnosis not present

## 2014-02-04 DIAGNOSIS — J449 Chronic obstructive pulmonary disease, unspecified: Secondary | ICD-10-CM | POA: Diagnosis not present

## 2014-02-04 DIAGNOSIS — X58XXXA Exposure to other specified factors, initial encounter: Secondary | ICD-10-CM | POA: Diagnosis not present

## 2014-02-04 DIAGNOSIS — F329 Major depressive disorder, single episode, unspecified: Secondary | ICD-10-CM | POA: Diagnosis not present

## 2014-02-04 DIAGNOSIS — F3289 Other specified depressive episodes: Secondary | ICD-10-CM | POA: Diagnosis not present

## 2014-02-04 DIAGNOSIS — Z01812 Encounter for preprocedural laboratory examination: Secondary | ICD-10-CM | POA: Diagnosis not present

## 2014-02-04 DIAGNOSIS — S22009A Unspecified fracture of unspecified thoracic vertebra, initial encounter for closed fracture: Secondary | ICD-10-CM | POA: Diagnosis not present

## 2014-02-04 DIAGNOSIS — Z79899 Other long term (current) drug therapy: Secondary | ICD-10-CM | POA: Diagnosis not present

## 2014-02-04 DIAGNOSIS — G8929 Other chronic pain: Secondary | ICD-10-CM | POA: Diagnosis not present

## 2014-02-04 DIAGNOSIS — F411 Generalized anxiety disorder: Secondary | ICD-10-CM | POA: Diagnosis not present

## 2014-02-04 DIAGNOSIS — M069 Rheumatoid arthritis, unspecified: Secondary | ICD-10-CM | POA: Diagnosis not present

## 2014-02-04 HISTORY — DX: Depression, unspecified: F32.A

## 2014-02-04 HISTORY — DX: Anxiety disorder, unspecified: F41.9

## 2014-02-04 HISTORY — DX: Gastro-esophageal reflux disease without esophagitis: K21.9

## 2014-02-04 HISTORY — DX: Major depressive disorder, single episode, unspecified: F32.9

## 2014-02-04 HISTORY — DX: Chronic obstructive pulmonary disease, unspecified: J44.9

## 2014-02-04 LAB — BASIC METABOLIC PANEL
Anion gap: 17 — ABNORMAL HIGH (ref 5–15)
BUN: 17 mg/dL (ref 6–23)
CO2: 25 mEq/L (ref 19–32)
CREATININE: 0.66 mg/dL (ref 0.50–1.10)
Calcium: 8.9 mg/dL (ref 8.4–10.5)
Chloride: 98 mEq/L (ref 96–112)
GFR calc non Af Amer: 89 mL/min — ABNORMAL LOW (ref >90)
Glucose, Bld: 102 mg/dL — ABNORMAL HIGH (ref 70–99)
Potassium: 3.7 mEq/L (ref 3.7–5.3)
SODIUM: 140 meq/L (ref 137–147)

## 2014-02-04 LAB — SURGICAL PCR SCREEN
MRSA, PCR: NEGATIVE
Staphylococcus aureus: NEGATIVE

## 2014-02-04 LAB — CBC
HCT: 39.8 % (ref 36.0–46.0)
Hemoglobin: 12.7 g/dL (ref 12.0–15.0)
MCH: 30.4 pg (ref 26.0–34.0)
MCHC: 31.9 g/dL (ref 30.0–36.0)
MCV: 95.2 fL (ref 78.0–100.0)
PLATELETS: 367 10*3/uL (ref 150–400)
RBC: 4.18 MIL/uL (ref 3.87–5.11)
RDW: 17.3 % — ABNORMAL HIGH (ref 11.5–15.5)
WBC: 11.3 10*3/uL — ABNORMAL HIGH (ref 4.0–10.5)

## 2014-02-04 NOTE — Progress Notes (Signed)
CXR and ov requested from Dr. Meredeth Ide @ [facility]

## 2014-02-04 NOTE — Pre-Procedure Instructions (Signed)
IDA MILBRATH  02/04/2014   Your procedure is scheduled on:  02-07-2014  Monday    Report to [facility] Admitting at 5:30 AM.   Call this number if you have problems the morning of surgery: [phone]   Remember:   Do not eat food or drink liquids after midnight.    Take these medicines the morning of surgery with A SIP OF WATER: Inhaler and nebulizer as needed,,Breo-ellipta,gabapentin(Neurotin),omeprazole(prilosec)   Do not wear jewelry, make-up or nail polish.  Do not wear lotions, powders, or perfumes. .  Do not shave 48 hours prior to surgery. Men may shave face and neck.  Do not bring valuables to the hospital.  [facility] is not responsible for any belongings or valuables.               Contacts, dentures or bridgework may not be worn into surgery   Leave suitcase in the car. After surgery it may be brought to your room.   For patients admitted to the hospital, discharge time is determined by your  treatment team.               Patients discharged the day of surgery will not be allowed to drive home   Name and phone number of your driver:     Special Instructions: See attached sheet for instructions on CHG shower/bath   Please read over the following fact sheets that you were given: Pain Booklet and Surgical Site Infection Prevention

## 2014-02-06 MED ORDER — CEFAZOLIN SODIUM-DEXTROSE 2-3 GM-% IV SOLR
2.0000 g | INTRAVENOUS | Status: AC
  Administered 2014-02-07: 2 g via INTRAVENOUS
  Filled 2014-02-06: qty 50

## 2014-02-07 ENCOUNTER — Observation Stay (HOSPITAL_COMMUNITY): Payer: MEDICARE

## 2014-02-07 ENCOUNTER — Encounter (HOSPITAL_COMMUNITY)
Admission: RE | Disposition: A | Discharge status: Home / Self Care | Payer: Self-pay | Source: Ambulatory Visit | Attending: Neurosurgery

## 2014-02-07 ENCOUNTER — Encounter (HOSPITAL_COMMUNITY): Payer: Self-pay | Admitting: Anesthesiology

## 2014-02-07 ENCOUNTER — Observation Stay (HOSPITAL_COMMUNITY)
Admission: RE | Admit: 2014-02-07 | Discharge: 2014-02-07 | Disposition: A | Discharge status: Home / Self Care | Payer: MEDICARE | Source: Ambulatory Visit | Attending: Neurosurgery | Admitting: Neurosurgery

## 2014-02-07 ENCOUNTER — Inpatient Hospital Stay (HOSPITAL_COMMUNITY): Payer: MEDICARE | Admitting: Anesthesiology

## 2014-02-07 ENCOUNTER — Encounter (HOSPITAL_COMMUNITY): Payer: MEDICARE | Admitting: Anesthesiology

## 2014-02-07 DIAGNOSIS — J4489 Other specified chronic obstructive pulmonary disease: Secondary | ICD-10-CM | POA: Insufficient documentation

## 2014-02-07 DIAGNOSIS — Z79899 Other long term (current) drug therapy: Secondary | ICD-10-CM | POA: Insufficient documentation

## 2014-02-07 DIAGNOSIS — M069 Rheumatoid arthritis, unspecified: Secondary | ICD-10-CM | POA: Insufficient documentation

## 2014-02-07 DIAGNOSIS — F329 Major depressive disorder, single episode, unspecified: Secondary | ICD-10-CM | POA: Insufficient documentation

## 2014-02-07 DIAGNOSIS — Z01812 Encounter for preprocedural laboratory examination: Secondary | ICD-10-CM | POA: Diagnosis not present

## 2014-02-07 DIAGNOSIS — Z87891 Personal history of nicotine dependence: Secondary | ICD-10-CM | POA: Insufficient documentation

## 2014-02-07 DIAGNOSIS — S22009A Unspecified fracture of unspecified thoracic vertebra, initial encounter for closed fracture: Secondary | ICD-10-CM | POA: Diagnosis not present

## 2014-02-07 DIAGNOSIS — F3289 Other specified depressive episodes: Secondary | ICD-10-CM | POA: Insufficient documentation

## 2014-02-07 DIAGNOSIS — I10 Essential (primary) hypertension: Secondary | ICD-10-CM | POA: Insufficient documentation

## 2014-02-07 DIAGNOSIS — Z01810 Encounter for preprocedural cardiovascular examination: Secondary | ICD-10-CM | POA: Insufficient documentation

## 2014-02-07 DIAGNOSIS — S22050A Wedge compression fracture of T5-T6 vertebra, initial encounter for closed fracture: Secondary | ICD-10-CM

## 2014-02-07 DIAGNOSIS — G8929 Other chronic pain: Secondary | ICD-10-CM | POA: Insufficient documentation

## 2014-02-07 DIAGNOSIS — K219 Gastro-esophageal reflux disease without esophagitis: Secondary | ICD-10-CM | POA: Insufficient documentation

## 2014-02-07 DIAGNOSIS — F411 Generalized anxiety disorder: Secondary | ICD-10-CM | POA: Insufficient documentation

## 2014-02-07 DIAGNOSIS — J449 Chronic obstructive pulmonary disease, unspecified: Secondary | ICD-10-CM | POA: Insufficient documentation

## 2014-02-07 DIAGNOSIS — X58XXXA Exposure to other specified factors, initial encounter: Secondary | ICD-10-CM | POA: Insufficient documentation

## 2014-02-07 HISTORY — PX: KYPHOPLASTY: SHX5884

## 2014-02-07 SURGERY — KYPHOPLASTY
Anesthesia: General

## 2014-02-07 MED ORDER — MENTHOL 3 MG MT LOZG: 1.0000 | LOZENGE | OROMUCOSAL | Status: DC | PRN

## 2014-02-07 MED ORDER — MAGNESIUM HYDROXIDE 400 MG/5ML PO SUSP: 30.0000 mL | Freq: Every day | ORAL | Status: DC | PRN

## 2014-02-07 MED ORDER — LIDOCAINE HCL (CARDIAC) 20 MG/ML IV SOLN
INTRAVENOUS | Status: AC
  Filled 2014-02-07: qty 5

## 2014-02-07 MED ORDER — LACTATED RINGERS IV SOLN
INTRAVENOUS | Status: DC | PRN
  Administered 2014-02-07: 07:00:00 via INTRAVENOUS

## 2014-02-07 MED ORDER — ROCURONIUM BROMIDE 50 MG/5ML IV SOLN
INTRAVENOUS | Status: AC
  Filled 2014-02-07: qty 1

## 2014-02-07 MED ORDER — SUCCINYLCHOLINE CHLORIDE 20 MG/ML IJ SOLN
INTRAMUSCULAR | Status: AC
  Filled 2014-02-07: qty 1

## 2014-02-07 MED ORDER — PHENYLEPHRINE HCL 10 MG/ML IJ SOLN
INTRAMUSCULAR | Status: DC | PRN
  Administered 2014-02-07 (×2): 40 ug via INTRAVENOUS
  Administered 2014-02-07: 60 ug via INTRAVENOUS

## 2014-02-07 MED ORDER — POTASSIUM CHLORIDE ER 10 MEQ PO TBCR
10.0000 meq | EXTENDED_RELEASE_TABLET | Freq: Every day | ORAL | Status: DC
  Administered 2014-02-07: 10 meq via ORAL
  Filled 2014-02-07: qty 1

## 2014-02-07 MED ORDER — PANTOPRAZOLE SODIUM 40 MG PO TBEC: 40.0000 mg | DELAYED_RELEASE_TABLET | Freq: Every day | ORAL | Status: DC

## 2014-02-07 MED ORDER — SODIUM CHLORIDE 0.9 % IV SOLN: 250.0000 mL | INTRAVENOUS | Status: DC

## 2014-02-07 MED ORDER — LIDOCAINE-EPINEPHRINE 1 %-1:100000 IJ SOLN
INTRAMUSCULAR | Status: DC | PRN
  Administered 2014-02-07: 4.25 mL

## 2014-02-07 MED ORDER — TRAZODONE HCL 100 MG PO TABS
100.0000 mg | ORAL_TABLET | Freq: Every day | ORAL | Status: DC
  Filled 2014-02-07: qty 1

## 2014-02-07 MED ORDER — FENTANYL CITRATE 0.05 MG/ML IJ SOLN
25.0000 ug | INTRAMUSCULAR | Status: DC | PRN
  Administered 2014-02-07 (×2): 25 ug via INTRAVENOUS

## 2014-02-07 MED ORDER — LIDOCAINE HCL (CARDIAC) 20 MG/ML IV SOLN
INTRAVENOUS | Status: DC | PRN
  Administered 2014-02-07: 100 mg via INTRATRACHEAL
  Administered 2014-02-07: 60 mg via INTRAVENOUS

## 2014-02-07 MED ORDER — HYDROXYZINE HCL 25 MG PO TABS: 50.0000 mg | ORAL_TABLET | ORAL | Status: DC | PRN

## 2014-02-07 MED ORDER — KCL IN DEXTROSE-NACL 20-5-0.45 MEQ/L-%-% IV SOLN
INTRAVENOUS | Status: DC
  Filled 2014-02-07 (×3): qty 1000

## 2014-02-07 MED ORDER — KETOROLAC TROMETHAMINE 30 MG/ML IJ SOLN
30.0000 mg | Freq: Once | INTRAMUSCULAR | Status: AC
  Administered 2014-02-07: 30 mg via INTRAVENOUS

## 2014-02-07 MED ORDER — HYDROCODONE-ACETAMINOPHEN 5-325 MG PO TABS: 1.0000 | ORAL_TABLET | ORAL | Status: DC | PRN

## 2014-02-07 MED ORDER — MIDAZOLAM HCL 2 MG/2ML IJ SOLN
INTRAMUSCULAR | Status: AC
Start: 2014-02-07 — End: 2014-02-07
  Filled 2014-02-07: qty 2

## 2014-02-07 MED ORDER — DULOXETINE HCL 30 MG PO CPEP: 30.0000 mg | ORAL_CAPSULE | Freq: Every day | ORAL | Status: DC

## 2014-02-07 MED ORDER — TORSEMIDE 5 MG PO TABS
5.0000 mg | ORAL_TABLET | Freq: Every day | ORAL | Status: DC
  Administered 2014-02-07: 5 mg via ORAL
  Filled 2014-02-07: qty 1

## 2014-02-07 MED ORDER — KETOROLAC TROMETHAMINE 30 MG/ML IJ SOLN
INTRAMUSCULAR | Status: AC
  Filled 2014-02-07: qty 1

## 2014-02-07 MED ORDER — HYDROCODONE-ACETAMINOPHEN 10-325 MG PO TABS
1.0000 | ORAL_TABLET | Freq: Four times a day (QID) | ORAL | Status: DC | PRN
Start: 2014-02-07 — End: 2014-02-07

## 2014-02-07 MED ORDER — PHENYLEPHRINE 40 MCG/ML (10ML) SYRINGE FOR IV PUSH (FOR BLOOD PRESSURE SUPPORT)
PREFILLED_SYRINGE | INTRAVENOUS | Status: AC
  Filled 2014-02-07: qty 10

## 2014-02-07 MED ORDER — ONDANSETRON HCL 4 MG/2ML IJ SOLN: 4.0000 mg | Freq: Four times a day (QID) | INTRAMUSCULAR | Status: DC | PRN

## 2014-02-07 MED ORDER — KETOROLAC TROMETHAMINE 30 MG/ML IJ SOLN
30.0000 mg | Freq: Four times a day (QID) | INTRAMUSCULAR | Status: DC
  Administered 2014-02-07: 30 mg via INTRAVENOUS
  Filled 2014-02-07: qty 1

## 2014-02-07 MED ORDER — HYDROMORPHONE HCL PF 1 MG/ML IJ SOLN: 0.2500 mg | INTRAMUSCULAR | Status: DC | PRN

## 2014-02-07 MED ORDER — BUPIVACAINE HCL (PF) 0.25 % IJ SOLN
INTRAMUSCULAR | Status: DC | PRN
  Administered 2014-02-07: 4.25 mL

## 2014-02-07 MED ORDER — FENTANYL CITRATE 0.05 MG/ML IJ SOLN
INTRAMUSCULAR | Status: DC | PRN
  Administered 2014-02-07 (×2): 50 ug via INTRAVENOUS
  Administered 2014-02-07: 100 ug via INTRAVENOUS

## 2014-02-07 MED ORDER — BISACODYL 10 MG RE SUPP: 10.0000 mg | Freq: Every day | RECTAL | Status: DC | PRN

## 2014-02-07 MED ORDER — GABAPENTIN 300 MG PO CAPS
600.0000 mg | ORAL_CAPSULE | Freq: Three times a day (TID) | ORAL | Status: DC
  Administered 2014-02-07: 600 mg via ORAL
  Filled 2014-02-07 (×2): qty 2

## 2014-02-07 MED ORDER — ROCURONIUM BROMIDE 100 MG/10ML IV SOLN
INTRAVENOUS | Status: DC | PRN
  Administered 2014-02-07: 15 mg via INTRAVENOUS

## 2014-02-07 MED ORDER — IOHEXOL 300 MG/ML  SOLN
INTRAMUSCULAR | Status: DC | PRN
  Administered 2014-02-07: 50 mL

## 2014-02-07 MED ORDER — FENTANYL CITRATE 0.05 MG/ML IJ SOLN
INTRAMUSCULAR | Status: AC
  Filled 2014-02-07: qty 5

## 2014-02-07 MED ORDER — DULOXETINE HCL 60 MG PO CPEP
60.0000 mg | ORAL_CAPSULE | Freq: Every morning | ORAL | Status: DC
  Filled 2014-02-07: qty 1

## 2014-02-07 MED ORDER — ONDANSETRON HCL 4 MG/2ML IJ SOLN
INTRAMUSCULAR | Status: DC | PRN
  Administered 2014-02-07: 4 mg via INTRAVENOUS

## 2014-02-07 MED ORDER — SODIUM CHLORIDE 0.9 % IJ SOLN: 3.0000 mL | Freq: Two times a day (BID) | INTRAMUSCULAR | Status: DC

## 2014-02-07 MED ORDER — CYCLOBENZAPRINE HCL 10 MG PO TABS: 10.0000 mg | ORAL_TABLET | Freq: Three times a day (TID) | ORAL | Status: DC | PRN

## 2014-02-07 MED ORDER — ARTIFICIAL TEARS OP OINT
TOPICAL_OINTMENT | OPHTHALMIC | Status: DC | PRN
  Administered 2014-02-07: 1 via OPHTHALMIC

## 2014-02-07 MED ORDER — ALBUTEROL SULFATE HFA 108 (90 BASE) MCG/ACT IN AERS: 1.0000 | INHALATION_SPRAY | Freq: Four times a day (QID) | RESPIRATORY_TRACT | Status: DC | PRN

## 2014-02-07 MED ORDER — ALUM & MAG HYDROXIDE-SIMETH 200-200-20 MG/5ML PO SUSP: 30.0000 mL | Freq: Four times a day (QID) | ORAL | Status: DC | PRN

## 2014-02-07 MED ORDER — OXYCODONE-ACETAMINOPHEN 5-325 MG PO TABS
1.0000 | ORAL_TABLET | ORAL | Status: DC | PRN
  Filled 2014-02-07: qty 2

## 2014-02-07 MED ORDER — ALBUTEROL SULFATE (2.5 MG/3ML) 0.083% IN NEBU: 2.5000 mg | INHALATION_SOLUTION | Freq: Four times a day (QID) | RESPIRATORY_TRACT | Status: DC | PRN

## 2014-02-07 MED ORDER — ACETAMINOPHEN 650 MG RE SUPP: 650.0000 mg | RECTAL | Status: DC | PRN

## 2014-02-07 MED ORDER — 0.9 % SODIUM CHLORIDE (POUR BTL) OPTIME
TOPICAL | Status: DC | PRN
  Administered 2014-02-07: 1000 mL

## 2014-02-07 MED ORDER — PHENOL 1.4 % MT LIQD: 1.0000 | OROMUCOSAL | Status: DC | PRN

## 2014-02-07 MED ORDER — ONDANSETRON HCL 4 MG/2ML IJ SOLN
INTRAMUSCULAR | Status: AC
Start: 2014-02-07 — End: 2014-02-07
  Filled 2014-02-07: qty 2

## 2014-02-07 MED ORDER — ACETAMINOPHEN 325 MG PO TABS: 650.0000 mg | ORAL_TABLET | ORAL | Status: DC | PRN

## 2014-02-07 MED ORDER — FENTANYL CITRATE 0.05 MG/ML IJ SOLN
INTRAMUSCULAR | Status: AC
  Filled 2014-02-07: qty 2

## 2014-02-07 MED ORDER — SODIUM CHLORIDE 0.9 % IJ SOLN: 3.0000 mL | INTRAMUSCULAR | Status: DC | PRN

## 2014-02-07 MED ORDER — MORPHINE SULFATE 4 MG/ML IJ SOLN: 4.0000 mg | INTRAMUSCULAR | Status: DC | PRN

## 2014-02-07 MED ORDER — PROPOFOL 10 MG/ML IV BOLUS
INTRAVENOUS | Status: AC
  Filled 2014-02-07: qty 20

## 2014-02-07 MED ORDER — ONDANSETRON HCL 4 MG/2ML IJ SOLN: 4.0000 mg | Freq: Once | INTRAMUSCULAR | Status: DC | PRN

## 2014-02-07 MED ORDER — PROPOFOL 10 MG/ML IV BOLUS
INTRAVENOUS | Status: DC | PRN
  Administered 2014-02-07: 130 mg via INTRAVENOUS

## 2014-02-07 MED ORDER — FLUTICASONE FUROATE-VILANTEROL 100-25 MCG/INH IN AEPB: 1.0000 | INHALATION_SPRAY | Freq: Every morning | RESPIRATORY_TRACT | Status: DC

## 2014-02-07 MED ORDER — NEOSTIGMINE METHYLSULFATE 10 MG/10ML IV SOLN
INTRAVENOUS | Status: DC | PRN
  Administered 2014-02-07: 1 mg via INTRAVENOUS

## 2014-02-07 SURGICAL SUPPLY — 46 items
BANDAGE ADH SHEER 1  50/CT (GAUZE/BANDAGES/DRESSINGS) ×8 IMPLANT
BLADE 10 SAFETY STRL DISP (BLADE) ×2 IMPLANT
BLADE SURG 11 STRL SS (BLADE) ×2 IMPLANT
BLADE SURG ROTATE 9660 (MISCELLANEOUS) IMPLANT
CEMENT BONE KYPHX HV R (Orthopedic Implant) ×2 IMPLANT
CEMENT KYPHON C01A KIT/MIXER (Cement) ×2 IMPLANT
CONT SPEC 4OZ CLIKSEAL STRL BL (MISCELLANEOUS) ×4 IMPLANT
DECANTER SPIKE VIAL GLASS SM (MISCELLANEOUS) ×2 IMPLANT
DERMABOND ADHESIVE PROPEN (GAUZE/BANDAGES/DRESSINGS) ×1
DERMABOND ADVANCED (GAUZE/BANDAGES/DRESSINGS)
DERMABOND ADVANCED .7 DNX12 (GAUZE/BANDAGES/DRESSINGS) IMPLANT
DERMABOND ADVANCED .7 DNX6 (GAUZE/BANDAGES/DRESSINGS) ×1 IMPLANT
DRAPE C-ARM 42X72 X-RAY (DRAPES) ×2 IMPLANT
DRAPE INCISE IOBAN 66X45 STRL (DRAPES) ×2 IMPLANT
DRAPE LAPAROTOMY 100X72X124 (DRAPES) ×2 IMPLANT
DRAPE PROXIMA HALF (DRAPES) ×2 IMPLANT
DURAPREP 26ML APPLICATOR (WOUND CARE) ×2 IMPLANT
GAUZE SPONGE 4X4 16PLY XRAY LF (GAUZE/BANDAGES/DRESSINGS) ×2 IMPLANT
GLOVE BIOGEL PI IND STRL 8 (GLOVE) ×1 IMPLANT
GLOVE BIOGEL PI IND STRL 8.5 (GLOVE) ×1 IMPLANT
GLOVE BIOGEL PI INDICATOR 8 (GLOVE) ×1
GLOVE BIOGEL PI INDICATOR 8.5 (GLOVE) ×1
GLOVE ECLIPSE 7.5 STRL STRAW (GLOVE) ×6 IMPLANT
GLOVE EXAM NITRILE LRG STRL (GLOVE) IMPLANT
GLOVE EXAM NITRILE MD LF STRL (GLOVE) IMPLANT
GLOVE EXAM NITRILE XL STR (GLOVE) IMPLANT
GLOVE EXAM NITRILE XS STR PU (GLOVE) IMPLANT
GOWN STRL REUS W/ TWL LRG LVL3 (GOWN DISPOSABLE) IMPLANT
GOWN STRL REUS W/ TWL XL LVL3 (GOWN DISPOSABLE) ×1 IMPLANT
GOWN STRL REUS W/TWL 2XL LVL3 (GOWN DISPOSABLE) ×2 IMPLANT
GOWN STRL REUS W/TWL LRG LVL3 (GOWN DISPOSABLE)
GOWN STRL REUS W/TWL XL LVL3 (GOWN DISPOSABLE) ×1
KIT BASIN OR (CUSTOM PROCEDURE TRAY) ×2 IMPLANT
KIT ROOM TURNOVER OR (KITS) ×2 IMPLANT
NEEDLE HYPO 25X1 1.5 SAFETY (NEEDLE) ×2 IMPLANT
NS IRRIG 1000ML POUR BTL (IV SOLUTION) ×2 IMPLANT
PACK SURGICAL SETUP 50X90 (CUSTOM PROCEDURE TRAY) ×2 IMPLANT
PAD ARMBOARD 7.5X6 YLW CONV (MISCELLANEOUS) ×6 IMPLANT
SPECIMEN JAR SMALL (MISCELLANEOUS) IMPLANT
SUT VIC AB 3-0 SH 8-18 (SUTURE) ×2 IMPLANT
SUT VIC AB 4-0 P-3 18X BRD (SUTURE) ×1 IMPLANT
SUT VIC AB 4-0 P3 18 (SUTURE) ×1
SYR CONTROL 10ML LL (SYRINGE) ×4 IMPLANT
TOWEL OR 17X24 6PK STRL BLUE (TOWEL DISPOSABLE) ×2 IMPLANT
TOWEL OR 17X26 10 PK STRL BLUE (TOWEL DISPOSABLE) ×2 IMPLANT
TRAY KYPHOPAK 15/3 ONESTEP 1ST (MISCELLANEOUS) ×2 IMPLANT

## 2014-02-07 NOTE — Plan of Care (Signed)
Problem: Consults Goal: Diagnosis - Spinal Surgery Outcome: Completed/Met Date Met:  02/07/14 Microdiscectomy Kyphoplasty

## 2014-02-07 NOTE — Progress Notes (Signed)
Pt doing well. Pt and husband given D/C instructions with verbal understanding of teaching. Pt D/C'd home via wheelchair @ 1945 per MD order. Pt's IV was removed prior to D/C. Pt is stable @ D/C and has no other needs. Rema Fendt, RN

## 2014-02-07 NOTE — Addendum Note (Signed)
Addendum created 02/07/14 1005 by Jerilee Hoh, CRNA   Modules edited: Anesthesia Flowsheet

## 2014-02-07 NOTE — Progress Notes (Signed)
On hold --waiting on 3500 RN to call back

## 2014-02-07 NOTE — Discharge Summary (Signed)
Physician Discharge Summary  Patient ID: Michelle Cabrera MRN: [id number] DOB/AGE: 68-Jan-1947 68 y.o.  Admit date: 02/07/2014 Discharge date: 02/07/2014  Admission Diagnoses:  T6 compression fracture  Discharge Diagnoses:  T6 compression fracture  Active Problems:   Wedge compression fracture of T6 vertebra   Discharged Condition: good  Hospital Course: Patient admitted, underwent a T6 kyphoplasty for a T6 compression fracture. Postoperatively she is to well, with excellent relief of her disabling back pain. She's been up and ambulating in the halls. Her wounds are clean and dry. She scheduled followup with me next month with x-rays, and she'll let us know she's anything else in the meantime. She's been given instructions regarding wound care and activities. I recommended to her that she discuss with her primary physician, Dr. Einar Crow, whether she might be a candidate for Forteo injections.  Discharge Exam: Blood pressure 115/74, pulse 80, temperature 98.6 F (37 C), temperature source Oral, resp. rate 18, height 5\' 3"  (1.6 m), weight 71.895 kg (158 lb 8 oz), SpO2 92.00%.  Disposition: Home     Medication List         acetaminophen 500 MG tablet  Commonly known as:  TYLENOL  Take 1,000 mg by mouth every 6 (six) hours as needed for mild pain.     albuterol 108 (90 BASE) MCG/ACT inhaler  Commonly known as:  PROVENTIL HFA;VENTOLIN HFA  Inhale 1-2 puffs into the lungs every 6 (six) hours as needed for wheezing or shortness of breath.     albuterol (2.5 MG/3ML) 0.083% nebulizer solution  Commonly known as:  PROVENTIL  Take 2.5 mg by nebulization every 6 (six) hours as needed for wheezing or shortness of breath.     Ben Gay Arthritis Formula 8-30 % Crea  Apply 1 application topically daily as needed (for back pain).     BREO ELLIPTA 100-25 MCG/INH Aepb  Generic drug:  Fluticasone Furoate-Vilanterol  Inhale 1 puff into the lungs every morning.     Calcium-Vitamin  D-Minerals 600-400 MG-UNIT Chew  Chew 2 tablets by mouth 2 (two) times daily.     DULoxetine 60 MG capsule  Commonly known as:  CYMBALTA  Take 60 mg by mouth every morning.     DULoxetine 30 MG capsule  Commonly known as:  CYMBALTA  Take 30 mg by mouth at bedtime.     Etanercept 25 MG/0.5ML Sosy  Inject 50 mg into the skin once a week. On monday     furosemide 40 MG tablet  Commonly known as:  LASIX  Take 40 mg by mouth daily as needed for fluid.     gabapentin 300 MG capsule  Commonly known as:  NEURONTIN  Take 600 mg by mouth 3 (three) times daily.     HYDROcodone-acetaminophen 10-325 MG per tablet  Commonly known as:  NORCO  Take 1 tablet by mouth every 6 (six) hours as needed for moderate pain.     Methotrexate (PF) 25 MG/0.4ML Soaj  Inject 50 mg into the skin once a week. On Monday     omeprazole 20 MG capsule  Commonly known as:  PRILOSEC  Take 20 mg by mouth 2 (two) times daily.     potassium chloride 10 MEQ tablet  Commonly known as:  K-DUR  Take 10 mEq by mouth daily.     RECLAST 5 MG/100ML Soln injection  Generic drug:  zoledronic acid  Inject 5 mg into the vein See admin instructions. *uses once a year*  torsemide 5 MG tablet  Commonly known as:  DEMADEX  Take 5 mg by mouth daily.     traZODone 50 MG tablet  Commonly known as:  DESYREL  Take 100 mg by mouth at bedtime.         Signed: Hewitt Shorts, MD 02/07/2014, 7:12 PM

## 2014-02-07 NOTE — Anesthesia Procedure Notes (Signed)
Procedure Name: Intubation Date/Time: 02/07/2014 7:27 AM Performed by: Jerilee Hoh Pre-anesthesia Checklist: Patient identified, Emergency Drugs available, Suction available and Patient being monitored Patient Re-evaluated:Patient Re-evaluated prior to inductionOxygen Delivery Method: Circle system utilized Preoxygenation: Pre-oxygenation with 100% oxygen Intubation Type: IV induction Ventilation: Mask ventilation without difficulty Laryngoscope Size: Mac and 3 Grade View: Grade IV Tube type: Oral Tube size: 7.0 mm Number of attempts: 2 Airway Equipment and Method: Bougie stylet and Stylet Placement Confirmation: ETT inserted through vocal cords under direct vision,  positive ETCO2 and breath sounds checked- equal and bilateral Secured at: 22 cm Dental Injury: Injury to lip

## 2014-02-07 NOTE — Op Note (Signed)
02/07/2014  8:48 AM  PATIENT:  [name]  68 y.o. female  PRE-OPERATIVE DIAGNOSIS:  Thoracic T6 compression fracture  POST-OPERATIVE DIAGNOSIS:  Thoracic T6 compression fracture  PROCEDURE:  Procedure(s):  THORACIC SIX KYPHOPLASTY  SURGEON:  Surgeon(s): Hewitt Shorts, MD  ANESTHESIA:   general  EBL:  nil  BLOOD ADMINISTERED:none  COUNT: Correct per nursing staff  DICTATION: Patient brought the operating room, placed under general endotracheal anesthesia. Patient was turned to a prone position, 2 C-arm fluoroscopes were set up in AP and lateral projections, and the T6 compression fracture was visualized. The posterior thoracic region was prepped with DuraPrep, draped in a sterile fashion. Entry points were localized bilaterally, slightly superior and lateral to the pedicle location. They were both yesterday with local site with epinephrine, and 3 mm stab incisions were made bilaterally at the entry points. Cannulated trocar was then passed down to the spine, the enter at the superior lateral margin of the pedicle on each side. With fluoroscopic guidance we gently passed the cannulated trocar down through the pedicle into the vertebral body in a superior lateral posterior to inferior medial anterior trajectory. Once the cannulas were in place, we gently drilled through the vertebral body towards the anterior cortical surface with fluoroscopic guidance. The kyphoplasty balloon system was then passed down through each of the cannulas and gently inflated on each side. Good cavities were created on each side, and vertebral body height was improved. We then prepared the methylmethacrylate, and gently deflated the balloon on the left side and removed it. The methylmethacrylate cannula was passed down through the outer cannula on the left side, and with fluoroscopic guidance we gently instilled methylmethacrylate into the kyphoplasty cavity on the left side with fluoroscopic guidance. We then  gently deflated the fluid on the right side and removed it. The methylmethacrylate cannula was passed down to the outer cannula on the right side, and with fluoroscopic guidance we gently instilled methylmethacrylate into the kyphoplasty cavity on the right side with fluoroscopic guidance. Fluoroscopy images showed good filling of the kyphoplasty cavities, significant improvement in vertebral body height, and no methylmethacrylate tracking back significantly, nor evidence of extravasation. We removed the outer cannulas, and final images were taken. The set cuticular was approximated with interrupted inverted 3-0 Vicryl suture, and the Dermabond was applied to the skin edges. Following surgery the patient was turned back to a supine position to be reversed from anesthetic, extubated, and transferred to the recovery room for further care.  PLAN OF CARE: Transfer to floor for observation, for discharge later today.  PATIENT DISPOSITION:  PACU - hemodynamically stable.   Delay start of Pharmacological VTE agent (>24hrs) due to surgical blood loss or risk of bleeding:  yes

## 2014-02-07 NOTE — H&P (Signed)
Subjective: Patient is a 68 y.o. female who is admitted for treatment of a T6 compression fracture. Patient developed pain in the mid to lower thoracic spine about 2-1/2 months ago. The pain has persisted despite nonsurgical measures and therefore she is admitted now for a T6 kyphoplasty. History is notable for having undergone 3 lumbar surgeries with Dr. Ruthann Cancer in [place], [place] in September 2010, December 2011, and December 2012. History is also notable for having presented over a year ago with an L1 compression fracture for which I performed a kyphoplasty.   Patient Active Problem List   Diagnosis Date Noted  . Intractable low back pain 12/27/2012  . DDD (degenerative disc disease) 12/27/2012  . Rheumatoid arthritis 12/27/2012  . Hypertension 12/27/2012   Past Medical History  Diagnosis Date  . Hypertension   . Rheumatoid arthritis   . DDD (degenerative disc disease)   . Chronic back pain   . Closed compression fracture of L1 lumbar vertebra 11/2012  . COPD (chronic obstructive pulmonary disease)   . Shortness of breath   . Anxiety   . Depression   . GERD (gastroesophageal reflux disease)     Past Surgical History  Procedure Laterality Date  . Lumbar disc surgery       X 3  . Kyphoplasty N/A 01/04/2013    Procedure: L1 Kyphoplasty;  Surgeon: Hewitt Shorts, MD;  Location: [facility];  Service: Neurosurgery;  Laterality: N/A;  L1 Kyphoplasty  . Tubal ligation    . Foot surgery    . Wrist fusion Right     30 yrs ago    Prescriptions prior to admission  Medication Sig Dispense Refill  . acetaminophen (TYLENOL) 500 MG tablet Take 1,000 mg by mouth every 6 (six) hours as needed for mild pain.      Marland Kitchen albuterol (PROVENTIL HFA;VENTOLIN HFA) 108 (90 BASE) MCG/ACT inhaler Inhale 1-2 puffs into the lungs every 6 (six) hours as needed for wheezing or shortness of breath.      Marland Kitchen albuterol (PROVENTIL) (2.5 MG/3ML) 0.083% nebulizer solution Take 2.5 mg by nebulization  every 6 (six) hours as needed for wheezing or shortness of breath.      . Calcium Carbonate-Vit D-Min (CALCIUM-VITAMIN D-MINERALS) 600-400 MG-UNIT CHEW Chew 2 tablets by mouth 2 (two) times daily.       . DULoxetine (CYMBALTA) 30 MG capsule Take 30 mg by mouth at bedtime.       . DULoxetine (CYMBALTA) 60 MG capsule Take 60 mg by mouth every morning.      . Etanercept 25 MG/0.5ML SOSY Inject 50 mg into the skin once a week. On monday      . Fluticasone Furoate-Vilanterol (BREO ELLIPTA) 100-25 MCG/INH AEPB Inhale 1 puff into the lungs every morning.      . furosemide (LASIX) 40 MG tablet Take 40 mg by mouth daily as needed for fluid.      Marland Kitchen gabapentin (NEURONTIN) 300 MG capsule Take 600 mg by mouth 3 (three) times daily.       Marland Kitchen HYDROcodone-acetaminophen (NORCO) 10-325 MG per tablet Take 1 tablet by mouth every 6 (six) hours as needed for moderate pain.      . Methotrexate, PF, 25 MG/0.4ML SOAJ Inject 50 mg into the skin once a week. On Monday      . omeprazole (PRILOSEC) 20 MG capsule Take 20 mg by mouth 2 (two) times daily.       . potassium chloride (K-DUR) 10 MEQ tablet Take  10 mEq by mouth daily.      Marland Kitchen torsemide (DEMADEX) 5 MG tablet Take 5 mg by mouth daily.      . traZODone (DESYREL) 50 MG tablet Take 100 mg by mouth at bedtime.       . zoledronic acid (RECLAST) 5 MG/100ML SOLN injection Inject 5 mg into the vein See admin instructions. *uses once a year*      . Menthol-Methyl Salicylate (BEN GAY ARTHRITIS FORMULA) 8-30 % CREA Apply 1 application topically daily as needed (for back pain).       Allergies  Allergen Reactions  . Adhesive [Tape]     Tears skin  . Dilaudid [Hydromorphone Hcl]     Made tongue swell    History  Substance Use Topics  . Smoking status: Former Smoker -- 0.50 packs/day    Quit date: 01/25/2014  . Smokeless tobacco: Not on file  . Alcohol Use: No    Family History  Problem Relation Age of Onset  . CAD Father   . CAD Mother   . Hypertension Mother   .  Peripheral vascular disease Mother      Review of Systems A comprehensive review of systems was negative.  Objective: Vital signs in last 24 hours: Temp:  [98.5 F (36.9 C)] 98.5 F (36.9 C) (07/13 0611) Pulse Rate:  [87] 87 (07/13 0611) Resp:  [20] 20 (07/13 0611) BP: (160)/(53) 160/53 mmHg (07/13 0611) SpO2:  [99 %] 99 % (07/13 0611) Weight:  [71.895 kg (158 lb 8 oz)] 71.895 kg (158 lb 8 oz) (07/13 0814)  EXAM: Patient is a well-developed, well-nourished white female in no acute distress. Lungs are clear to auscultation , the patient has symmetrical respiratory excursion. Heart has a regular rate and rhythm normal S1 and S2 no murmur.   Abdomen is soft nontender nondistended bowel sounds are present. Extremity examination shows no clubbing cyanosis or edema. Motor examination shows 5 over 5 strength in the lower extremities including the iliopsoas quadriceps dorsiflexor extensor hallicus  longus and plantar flexor bilaterally. Sensation is more intact to pinprick in the right lower extremity, as compared to the left lower extremity. Reflexes show the left quadriceps is minimal, right quadriceps is 1-2. Gastrocnemius are absent bilaterally. No pathologic reflexes are present. Patient has a normal gait and stance.   Data Review:CBC    Component Value Date/Time   WBC 11.3* 02/04/2014 1038   RBC 4.18 02/04/2014 1038   HGB 12.7 02/04/2014 1038   HCT 39.8 02/04/2014 1038   PLT 367 02/04/2014 1038   MCV 95.2 02/04/2014 1038   MCH 30.4 02/04/2014 1038   MCHC 31.9 02/04/2014 1038   RDW 17.3* 02/04/2014 1038   LYMPHSABS 1.0 12/26/2012 1726   MONOABS 1.2* 12/26/2012 1726   EOSABS 0.0 12/26/2012 1726   BASOSABS 0.0 12/26/2012 1726                          BMET    Component Value Date/Time   NA 140 02/04/2014 1038   K 3.7 02/04/2014 1038   CL 98 02/04/2014 1038   CO2 25 02/04/2014 1038   GLUCOSE 102* 02/04/2014 1038   BUN 17 02/04/2014 1038   CREATININE 0.66 02/04/2014 1038   CALCIUM 8.9 02/04/2014  1038   GFRNONAA 89* 02/04/2014 1038   GFRAA >90 02/04/2014 1038     Assessment/Plan: Patient with a T6 compression fracture, admitted for a T6 kyphoplasty. We discussed nature condition, the nature the  surgical procedure, and risks including risks of infection, bleeding, neurologic deficit, and anesthetic complications of myocardial infarction, stroke, pneumonia, and death. We'll discuss the risks of further compression fractures of the thoracic and lumbar spine. Understanding all this the patient wishes to proceed with surgery and is admitted for such.   Hewitt Shorts, MD 02/07/2014 7:04 AM

## 2014-02-07 NOTE — Transfer of Care (Signed)
Immediate Anesthesia Transfer of Care Note  Patient: Michelle Cabrera  Procedure(s) Performed: Procedure(s) with comments: THORACIC SIX KYPHOPLASTY (N/A) - T6 Kyphoplasty   Patient Location: PACU  Anesthesia Type:General  Level of Consciousness: sedated, patient cooperative and responds to stimulation  Airway & Oxygen Therapy: Patient Spontanous Breathing and Patient connected to nasal cannula oxygen  Post-op Assessment: Report given to PACU RN, Post -op Vital signs reviewed and stable and Patient moving all extremities  Post vital signs: Reviewed and stable  Complications: No apparent anesthesia complications

## 2014-02-07 NOTE — Anesthesia Postprocedure Evaluation (Signed)
  Anesthesia Post-op Note  Patient: Michelle Cabrera  Procedure(s) Performed: Procedure(s) with comments: THORACIC SIX KYPHOPLASTY (N/A) - T6 Kyphoplasty   Patient Location: PACU  Anesthesia Type:General  Level of Consciousness: awake, alert , oriented and patient cooperative  Airway and Oxygen Therapy: Patient Spontanous Breathing  Post-op Pain: mild  Post-op Assessment: Post-op Vital signs reviewed, Patient's Cardiovascular Status Stable, Respiratory Function Stable, Patent Airway, No signs of Nausea or vomiting and Pain level controlled  Post-op Vital Signs: stable  Last Vitals:  Filed Vitals:   02/07/14 0922  BP: 172/75  Pulse: 97  Temp:   Resp: 19    Complications: No apparent anesthesia complications

## 2014-02-07 NOTE — Anesthesia Preprocedure Evaluation (Signed)
Anesthesia Evaluation  Patient identified by MRN, date of birth, ID band Patient awake    Reviewed: Allergy & Precautions, H&P , NPO status , Patient's Chart, lab work & pertinent test results  Airway       Dental   Pulmonary COPDformer smoker,          Cardiovascular hypertension,     Neuro/Psych    GI/Hepatic GERD-  ,  Endo/Other    Renal/GU      Musculoskeletal  (+) Arthritis -,   Abdominal   Peds  Hematology   Anesthesia Other Findings   Reproductive/Obstetrics                           Anesthesia Physical Anesthesia Plan  ASA: II  Anesthesia Plan: General   Post-op Pain Management:    Induction: Intravenous  Airway Management Planned: Oral ETT  Additional Equipment:   Intra-op Plan:   Post-operative Plan: Extubation in OR  Informed Consent: I have reviewed the patients History and Physical, chart, labs and discussed the procedure including the risks, benefits and alternatives for the proposed anesthesia with the patient or authorized representative who has indicated his/her understanding and acceptance.     Plan Discussed with:   Anesthesia Plan Comments:         Anesthesia Quick Evaluation

## 2014-02-08 ENCOUNTER — Encounter (HOSPITAL_COMMUNITY): Payer: Self-pay | Admitting: Neurosurgery

## 2014-04-27 ENCOUNTER — Ambulatory Visit: Payer: Self-pay | Admitting: Neurosurgery

## 2014-08-04 ENCOUNTER — Other Ambulatory Visit: Payer: Self-pay | Admitting: Orthopedic Surgery

## 2014-08-16 DIAGNOSIS — I700 Atherosclerosis of aorta: Secondary | ICD-10-CM | POA: Insufficient documentation

## 2014-09-06 NOTE — Pre-Procedure Instructions (Signed)
Michelle Cabrera  09/06/2014   Your procedure is scheduled on:  09-15-2014   Thursday   Report to [facility] Admitting at 11:00 AM.   Call this number if you have problems the morning of surgery: [phone]   Remember:   Do not eat food or drink liquids after midnight.    Take these medicines the morning of surgery with A SIP OF WATER: tylenol if needed,amlodipine(Norvasc)cymbalta,Breo inhaler,duoneb nebulizer,omeprazole(prilosec),pain medication as needed,   Do not wear jewelry, make-up or nail polish.  Do not wear lotions, powders, or perfumes. You may not wear deodorant.  Do not shave 48 hours prior to surgery.   Do not bring valuables to the hospital.  [facility] is not responsible for any belongings or valuables.               Contacts, dentures or bridgework may not be worn into surgery.                    Patients discharged the day of surgery will not be allowed to drive home.    Name and phone number of your driver:    Special Instructions: See attached sheet for instructions on CHG shower/bath   Please read over the following fact sheets that you were given: Pain Booklet and Surgical Site Infection Prevention

## 2014-09-07 ENCOUNTER — Encounter (HOSPITAL_COMMUNITY)
Admission: RE | Admit: 2014-09-07 | Discharge: 2014-09-07 | Disposition: A | Discharge status: Home / Self Care | Payer: Medicare Other | Source: Ambulatory Visit | Attending: Orthopedic Surgery | Admitting: Orthopedic Surgery

## 2014-09-07 ENCOUNTER — Encounter (HOSPITAL_COMMUNITY): Payer: Self-pay

## 2014-09-07 DIAGNOSIS — M069 Rheumatoid arthritis, unspecified: Secondary | ICD-10-CM | POA: Insufficient documentation

## 2014-09-07 DIAGNOSIS — Z01812 Encounter for preprocedural laboratory examination: Secondary | ICD-10-CM | POA: Insufficient documentation

## 2014-09-07 LAB — BASIC METABOLIC PANEL
ANION GAP: 9 (ref 5–15)
BUN: 20 mg/dL (ref 6–23)
CALCIUM: 8.8 mg/dL (ref 8.4–10.5)
CO2: 25 mmol/L (ref 19–32)
CREATININE: 0.75 mg/dL (ref 0.50–1.10)
Chloride: 106 mmol/L (ref 96–112)
GFR calc Af Amer: >90 mL/min (ref >90)
GFR, EST NON AFRICAN AMERICAN: 85 mL/min — AB (ref >90)
Glucose, Bld: 108 mg/dL — ABNORMAL HIGH (ref 70–99)
POTASSIUM: 4.3 mmol/L (ref 3.5–5.1)
SODIUM: 140 mmol/L (ref 135–145)

## 2014-09-07 LAB — CBC
HCT: 41 % (ref 36.0–46.0)
Hemoglobin: 13.7 g/dL (ref 12.0–15.0)
MCH: 30.9 pg (ref 26.0–34.0)
MCHC: 33.4 g/dL (ref 30.0–36.0)
MCV: 92.6 fL (ref 78.0–100.0)
PLATELETS: 372 10*3/uL (ref 150–400)
RBC: 4.43 MIL/uL (ref 3.87–5.11)
RDW: 16.5 % — ABNORMAL HIGH (ref 11.5–15.5)
WBC: 11.6 10*3/uL — ABNORMAL HIGH (ref 4.0–10.5)

## 2014-09-14 MED ORDER — SODIUM CHLORIDE 0.45 % IV SOLN: INTRAVENOUS | Status: DC

## 2014-09-14 MED ORDER — CEFAZOLIN SODIUM-DEXTROSE 2-3 GM-% IV SOLR
2.0000 g | INTRAVENOUS | Status: AC
  Administered 2014-09-15: 2 g via INTRAVENOUS
  Filled 2014-09-14: qty 50

## 2014-09-15 ENCOUNTER — Encounter (HOSPITAL_COMMUNITY)
Admission: RE | Disposition: A | Discharge status: Home / Self Care | Payer: Self-pay | Source: Ambulatory Visit | Attending: Orthopedic Surgery

## 2014-09-15 ENCOUNTER — Ambulatory Visit (HOSPITAL_COMMUNITY): Payer: Medicare Other | Admitting: Anesthesiology

## 2014-09-15 ENCOUNTER — Encounter (HOSPITAL_COMMUNITY): Payer: Self-pay | Admitting: Certified Registered Nurse Anesthetist

## 2014-09-15 ENCOUNTER — Observation Stay (HOSPITAL_COMMUNITY)
Admission: RE | Admit: 2014-09-15 | Discharge: 2014-09-16 | Disposition: A | Discharge status: Home / Self Care | Payer: Medicare Other | Source: Ambulatory Visit | Attending: Orthopedic Surgery | Admitting: Orthopedic Surgery

## 2014-09-15 DIAGNOSIS — M20091 Other deformity of right finger(s): Principal | ICD-10-CM | POA: Insufficient documentation

## 2014-09-15 DIAGNOSIS — I10 Essential (primary) hypertension: Secondary | ICD-10-CM | POA: Diagnosis not present

## 2014-09-15 DIAGNOSIS — M549 Dorsalgia, unspecified: Secondary | ICD-10-CM | POA: Diagnosis not present

## 2014-09-15 DIAGNOSIS — Z7952 Long term (current) use of systemic steroids: Secondary | ICD-10-CM | POA: Diagnosis not present

## 2014-09-15 DIAGNOSIS — G8929 Other chronic pain: Secondary | ICD-10-CM | POA: Diagnosis not present

## 2014-09-15 DIAGNOSIS — F329 Major depressive disorder, single episode, unspecified: Secondary | ICD-10-CM | POA: Diagnosis not present

## 2014-09-15 DIAGNOSIS — F17210 Nicotine dependence, cigarettes, uncomplicated: Secondary | ICD-10-CM | POA: Insufficient documentation

## 2014-09-15 DIAGNOSIS — F419 Anxiety disorder, unspecified: Secondary | ICD-10-CM | POA: Insufficient documentation

## 2014-09-15 DIAGNOSIS — M1990 Unspecified osteoarthritis, unspecified site: Secondary | ICD-10-CM | POA: Diagnosis present

## 2014-09-15 DIAGNOSIS — Z79899 Other long term (current) drug therapy: Secondary | ICD-10-CM | POA: Diagnosis not present

## 2014-09-15 DIAGNOSIS — K219 Gastro-esophageal reflux disease without esophagitis: Secondary | ICD-10-CM | POA: Insufficient documentation

## 2014-09-15 DIAGNOSIS — M069 Rheumatoid arthritis, unspecified: Secondary | ICD-10-CM | POA: Insufficient documentation

## 2014-09-15 DIAGNOSIS — J449 Chronic obstructive pulmonary disease, unspecified: Secondary | ICD-10-CM | POA: Insufficient documentation

## 2014-09-15 DIAGNOSIS — Z885 Allergy status to narcotic agent status: Secondary | ICD-10-CM | POA: Insufficient documentation

## 2014-09-15 HISTORY — DX: Low back pain, unspecified: M54.50

## 2014-09-15 HISTORY — PX: FINGER ARTHROPLASTY: SHX5017

## 2014-09-15 HISTORY — PX: HAND TENDON SURGERY: SHX663

## 2014-09-15 HISTORY — DX: Low back pain: M54.5

## 2014-09-15 HISTORY — DX: Other chronic pain: G89.29

## 2014-09-15 SURGERY — ARTHROPLASTY, FINGER
Anesthesia: General | Site: Finger | Laterality: Right

## 2014-09-15 MED ORDER — LACTATED RINGERS IV SOLN
INTRAVENOUS | Status: DC
  Administered 2014-09-16: 06:00:00 via INTRAVENOUS

## 2014-09-15 MED ORDER — LACTATED RINGERS IV SOLN
INTRAVENOUS | Status: DC | PRN
  Administered 2014-09-15 (×2): via INTRAVENOUS

## 2014-09-15 MED ORDER — FENTANYL CITRATE 0.05 MG/ML IJ SOLN
INTRAMUSCULAR | Status: AC
  Filled 2014-09-15: qty 5

## 2014-09-15 MED ORDER — ONDANSETRON HCL 4 MG PO TABS: 4.0000 mg | ORAL_TABLET | Freq: Four times a day (QID) | ORAL | Status: DC | PRN

## 2014-09-15 MED ORDER — CEFAZOLIN SODIUM 1-5 GM-% IV SOLN
1.0000 g | Freq: Three times a day (TID) | INTRAVENOUS | Status: DC
  Administered 2014-09-16 (×2): 1 g via INTRAVENOUS
  Filled 2014-09-15 (×5): qty 50

## 2014-09-15 MED ORDER — DEXTROSE 5 % IV SOLN
500.0000 mg | Freq: Four times a day (QID) | INTRAVENOUS | Status: DC | PRN
  Filled 2014-09-15: qty 5

## 2014-09-15 MED ORDER — ONDANSETRON HCL 4 MG/2ML IJ SOLN: 4.0000 mg | Freq: Four times a day (QID) | INTRAMUSCULAR | Status: DC | PRN

## 2014-09-15 MED ORDER — DOCUSATE SODIUM 100 MG PO CAPS
100.0000 mg | ORAL_CAPSULE | Freq: Two times a day (BID) | ORAL | Status: DC
  Administered 2014-09-15 – 2014-09-16 (×2): 100 mg via ORAL
  Filled 2014-09-15 (×2): qty 1

## 2014-09-15 MED ORDER — GABAPENTIN 300 MG PO CAPS: 600.0000 mg | ORAL_CAPSULE | Freq: Three times a day (TID) | ORAL | Status: DC | PRN

## 2014-09-15 MED ORDER — CEFAZOLIN SODIUM 1-5 GM-% IV SOLN
1.0000 g | INTRAVENOUS | Status: AC
  Administered 2014-09-15: 1 g via INTRAVENOUS
  Filled 2014-09-15 (×2): qty 50

## 2014-09-15 MED ORDER — 0.9 % SODIUM CHLORIDE (POUR BTL) OPTIME
TOPICAL | Status: DC | PRN
  Administered 2014-09-15: 1000 mL

## 2014-09-15 MED ORDER — ONDANSETRON HCL 4 MG/2ML IJ SOLN
INTRAMUSCULAR | Status: DC | PRN
  Administered 2014-09-15: 4 mg via INTRAVENOUS

## 2014-09-15 MED ORDER — FENTANYL CITRATE 0.05 MG/ML IJ SOLN
INTRAMUSCULAR | Status: AC
  Administered 2014-09-15: 50 ug
  Filled 2014-09-15: qty 2

## 2014-09-15 MED ORDER — GLYCOPYRROLATE 0.2 MG/ML IJ SOLN
INTRAMUSCULAR | Status: AC
  Filled 2014-09-15: qty 3

## 2014-09-15 MED ORDER — MIDAZOLAM HCL 2 MG/2ML IJ SOLN
INTRAMUSCULAR | Status: AC
  Filled 2014-09-15: qty 2

## 2014-09-15 MED ORDER — VITAMIN C 500 MG PO TABS: 500.0000 mg | ORAL_TABLET | Freq: Every day | ORAL | Status: DC

## 2014-09-15 MED ORDER — DULOXETINE HCL 30 MG PO CPEP
30.0000 mg | ORAL_CAPSULE | Freq: Every day | ORAL | Status: DC
  Administered 2014-09-15: 30 mg via ORAL
  Filled 2014-09-15: qty 1

## 2014-09-15 MED ORDER — IPRATROPIUM-ALBUTEROL 0.5-2.5 (3) MG/3ML IN SOLN
3.0000 mL | Freq: Four times a day (QID) | RESPIRATORY_TRACT | Status: DC
  Administered 2014-09-15 – 2014-09-16 (×2): 3 mL via RESPIRATORY_TRACT
  Filled 2014-09-15 (×2): qty 3

## 2014-09-15 MED ORDER — ARTIFICIAL TEARS OP OINT
TOPICAL_OINTMENT | OPHTHALMIC | Status: AC
  Filled 2014-09-15: qty 3.5

## 2014-09-15 MED ORDER — FENTANYL CITRATE 0.05 MG/ML IJ SOLN: 25.0000 ug | INTRAMUSCULAR | Status: DC | PRN

## 2014-09-15 MED ORDER — LIDOCAINE HCL 1 % IJ SOLN
INTRAMUSCULAR | Status: DC | PRN
  Administered 2014-09-15: 60 mg via INTRADERMAL

## 2014-09-15 MED ORDER — FENTANYL CITRATE 0.05 MG/ML IJ SOLN
INTRAMUSCULAR | Status: DC | PRN
  Administered 2014-09-15 (×3): 50 ug via INTRAVENOUS

## 2014-09-15 MED ORDER — PHENYLEPHRINE 40 MCG/ML (10ML) SYRINGE FOR IV PUSH (FOR BLOOD PRESSURE SUPPORT)
PREFILLED_SYRINGE | INTRAVENOUS | Status: AC
  Filled 2014-09-15: qty 20

## 2014-09-15 MED ORDER — PANTOPRAZOLE SODIUM 40 MG PO TBEC
40.0000 mg | DELAYED_RELEASE_TABLET | Freq: Every day | ORAL | Status: DC
  Administered 2014-09-16: 40 mg via ORAL
  Filled 2014-09-15: qty 1

## 2014-09-15 MED ORDER — PROPOFOL 10 MG/ML IV BOLUS
INTRAVENOUS | Status: AC
  Filled 2014-09-15: qty 20

## 2014-09-15 MED ORDER — VITAMIN C 500 MG PO TABS
1000.0000 mg | ORAL_TABLET | Freq: Every day | ORAL | Status: DC
  Administered 2014-09-16: 1000 mg via ORAL
  Filled 2014-09-15: qty 2

## 2014-09-15 MED ORDER — MORPHINE SULFATE 2 MG/ML IJ SOLN: 1.0000 mg | INTRAMUSCULAR | Status: DC | PRN

## 2014-09-15 MED ORDER — NEOSTIGMINE METHYLSULFATE 10 MG/10ML IV SOLN
INTRAVENOUS | Status: AC
  Filled 2014-09-15: qty 1

## 2014-09-15 MED ORDER — TRAZODONE HCL 50 MG PO TABS
50.0000 mg | ORAL_TABLET | Freq: Every day | ORAL | Status: DC
  Administered 2014-09-15: 50 mg via ORAL
  Filled 2014-09-15: qty 1

## 2014-09-15 MED ORDER — PHENYLEPHRINE HCL 10 MG/ML IJ SOLN
INTRAMUSCULAR | Status: DC | PRN
  Administered 2014-09-15: 80 ug via INTRAVENOUS

## 2014-09-15 MED ORDER — PROPOFOL 10 MG/ML IV BOLUS
INTRAVENOUS | Status: DC | PRN
  Administered 2014-09-15: 130 mg via INTRAVENOUS

## 2014-09-15 MED ORDER — MIDAZOLAM HCL 2 MG/2ML IJ SOLN
INTRAMUSCULAR | Status: AC
  Administered 2014-09-15: 1 mg
  Filled 2014-09-15: qty 2

## 2014-09-15 MED ORDER — PREDNISONE 5 MG PO TABS
5.0000 mg | ORAL_TABLET | Freq: Every day | ORAL | Status: DC
  Administered 2014-09-16: 5 mg via ORAL
  Filled 2014-09-15: qty 1

## 2014-09-15 MED ORDER — DULOXETINE HCL 60 MG PO CPEP
60.0000 mg | ORAL_CAPSULE | Freq: Every morning | ORAL | Status: DC
  Administered 2014-09-16: 60 mg via ORAL
  Filled 2014-09-15: qty 1

## 2014-09-15 MED ORDER — LIDOCAINE HCL (CARDIAC) 20 MG/ML IV SOLN
INTRAVENOUS | Status: AC
  Filled 2014-09-15: qty 5

## 2014-09-15 MED ORDER — OXYCODONE HCL 5 MG PO TABS
5.0000 mg | ORAL_TABLET | ORAL | Status: DC | PRN
  Administered 2014-09-16: 10 mg via ORAL
  Filled 2014-09-15: qty 2

## 2014-09-15 MED ORDER — LACTATED RINGERS IV SOLN
INTRAVENOUS | Status: DC
  Administered 2014-09-15: 12:00:00 via INTRAVENOUS

## 2014-09-15 MED ORDER — AMLODIPINE BESYLATE 5 MG PO TABS: 5.0000 mg | ORAL_TABLET | Freq: Every day | ORAL | Status: DC | PRN

## 2014-09-15 MED ORDER — BUPIVACAINE-EPINEPHRINE (PF) 0.5% -1:200000 IJ SOLN
INTRAMUSCULAR | Status: DC | PRN
  Administered 2014-09-15: 25 mL via PERINEURAL

## 2014-09-15 MED ORDER — METHOCARBAMOL 500 MG PO TABS: 500.0000 mg | ORAL_TABLET | Freq: Four times a day (QID) | ORAL | Status: DC | PRN

## 2014-09-15 MED ORDER — SUCCINYLCHOLINE CHLORIDE 20 MG/ML IJ SOLN
INTRAMUSCULAR | Status: AC
  Filled 2014-09-15: qty 1

## 2014-09-15 MED ORDER — ONDANSETRON HCL 4 MG/2ML IJ SOLN
INTRAMUSCULAR | Status: AC
  Filled 2014-09-15: qty 2

## 2014-09-15 MED ORDER — PROMETHAZINE HCL 25 MG RE SUPP: 12.5000 mg | Freq: Four times a day (QID) | RECTAL | Status: DC | PRN

## 2014-09-15 MED ORDER — ROCURONIUM BROMIDE 50 MG/5ML IV SOLN
INTRAVENOUS | Status: AC
  Filled 2014-09-15: qty 1

## 2014-09-15 SURGICAL SUPPLY — 53 items
BANDAGE ELASTIC 3 VELCRO ST LF (GAUZE/BANDAGES/DRESSINGS) IMPLANT
BANDAGE ELASTIC 4 VELCRO ST LF (GAUZE/BANDAGES/DRESSINGS) IMPLANT
BNDG COHESIVE 1X5 TAN STRL LF (GAUZE/BANDAGES/DRESSINGS) IMPLANT
BNDG CONFORM 2 STRL LF (GAUZE/BANDAGES/DRESSINGS) ×3 IMPLANT
BNDG GAUZE ELAST 4 BULKY (GAUZE/BANDAGES/DRESSINGS) ×6 IMPLANT
CORDS BIPOLAR (ELECTRODE) ×3 IMPLANT
COVER SURGICAL LIGHT HANDLE (MISCELLANEOUS) ×3 IMPLANT
CUFF TOURNIQUET SINGLE 18IN (TOURNIQUET CUFF) ×3 IMPLANT
CUFF TOURNIQUET SINGLE 24IN (TOURNIQUET CUFF) IMPLANT
DRAPE OEC MINIVIEW 54X84 (DRAPES) ×3 IMPLANT
DRAPE SURG 17X23 STRL (DRAPES) ×3 IMPLANT
DRSG ADAPTIC 3X8 NADH LF (GAUZE/BANDAGES/DRESSINGS) ×3 IMPLANT
GAUZE SPONGE 2X2 8PLY STRL LF (GAUZE/BANDAGES/DRESSINGS) IMPLANT
GAUZE SPONGE 4X4 12PLY STRL (GAUZE/BANDAGES/DRESSINGS) ×3 IMPLANT
GAUZE XEROFORM 1X8 LF (GAUZE/BANDAGES/DRESSINGS) IMPLANT
GAUZE XEROFORM 5X9 LF (GAUZE/BANDAGES/DRESSINGS) ×3 IMPLANT
GLOVE BIOGEL M STRL SZ7.5 (GLOVE) ×3 IMPLANT
GLOVE SS BIOGEL STRL SZ 8 (GLOVE) ×1 IMPLANT
GLOVE SUPERSENSE BIOGEL SZ 8 (GLOVE) ×2
GOWN STRL REUS W/ TWL LRG LVL3 (GOWN DISPOSABLE) ×1 IMPLANT
GOWN STRL REUS W/ TWL XL LVL3 (GOWN DISPOSABLE) ×2 IMPLANT
GOWN STRL REUS W/TWL LRG LVL3 (GOWN DISPOSABLE) ×2
GOWN STRL REUS W/TWL XL LVL3 (GOWN DISPOSABLE) ×4
KIT BASIN OR (CUSTOM PROCEDURE TRAY) ×3 IMPLANT
KIT ROOM TURNOVER OR (KITS) ×3 IMPLANT
LOOP VESSEL MAXI BLUE (MISCELLANEOUS) ×3 IMPLANT
MANIFOLD NEPTUNE II (INSTRUMENTS) IMPLANT
NEEDLE HYPO 25GX1X1/2 BEV (NEEDLE) ×3 IMPLANT
NS IRRIG 1000ML POUR BTL (IV SOLUTION) ×3 IMPLANT
PACK ORTHO EXTREMITY (CUSTOM PROCEDURE TRAY) ×3 IMPLANT
PAD ARMBOARD 7.5X6 YLW CONV (MISCELLANEOUS) ×6 IMPLANT
PAD CAST 4YDX4 CTTN HI CHSV (CAST SUPPLIES) ×1 IMPLANT
PADDING CAST ABS 3INX4YD NS (CAST SUPPLIES) ×2
PADDING CAST ABS COTTON 3X4 (CAST SUPPLIES) ×1 IMPLANT
PADDING CAST COTTON 4X4 STRL (CAST SUPPLIES) ×2
SOLUTION BETADINE 4OZ (MISCELLANEOUS) ×3 IMPLANT
SPONGE GAUZE 2X2 STER 10/PKG (GAUZE/BANDAGES/DRESSINGS)
SPONGE SCRUB IODOPHOR (GAUZE/BANDAGES/DRESSINGS) ×3 IMPLANT
SUCTION FRAZIER TIP 10 FR DISP (SUCTIONS) IMPLANT
SUT CHROMIC 5 0 P 3 (SUTURE) ×3 IMPLANT
SUT FIBERWIRE 4-0 18 DIAM BLUE (SUTURE) ×9
SUT MERSILENE 4 0 P 3 (SUTURE) IMPLANT
SUT PROLENE 4 0 P 3 18 (SUTURE) ×3 IMPLANT
SUT PROLENE 4 0 PS 2 18 (SUTURE) IMPLANT
SUT PROLENE 5 0 P 3 (SUTURE) ×6 IMPLANT
SUTURE FIBERWR 4-0 18 DIA BLUE (SUTURE) ×3 IMPLANT
SYR CONTROL 10ML LL (SYRINGE) IMPLANT
TOWEL OR 17X24 6PK STRL BLUE (TOWEL DISPOSABLE) ×3 IMPLANT
TOWEL OR 17X26 10 PK STRL BLUE (TOWEL DISPOSABLE) ×3 IMPLANT
TUBE CONNECTING 12'X1/4 (SUCTIONS)
TUBE CONNECTING 12X1/4 (SUCTIONS) IMPLANT
UNDERPAD 30X30 INCONTINENT (UNDERPADS AND DIAPERS) ×3 IMPLANT
WATER STERILE IRR 1000ML POUR (IV SOLUTION) ×3 IMPLANT

## 2014-09-15 NOTE — Anesthesia Postprocedure Evaluation (Signed)
  Anesthesia Post-op Note  Patient: Michelle Cabrera  Procedure(s) Performed: Procedure(s): RIGHT MIDDLE FINGER, RING FINGER, SMALL FINGER EXTENSOR DIGITORUM COMMUMIS STABILIZATION WITH RIGHT MIDDLE FINGER MCP ARTHROPLASTY, POSSIBLE RING FINGER AND SMALL FINGER MCP ARTHROPLASTY (Right)  Patient Location: PACU  Anesthesia Type:General  Level of Consciousness: awake and alert   Airway and Oxygen Therapy: Patient Spontanous Breathing  Post-op Pain: none  Post-op Assessment: Post-op Vital signs reviewed  Post-op Vital Signs: Reviewed  Last Vitals:  Filed Vitals:   09/15/14 1904  BP: 141/80  Pulse: 79  Temp: 36.6 C  Resp: 16    Complications: No apparent anesthesia complications

## 2014-09-15 NOTE — Transfer of Care (Signed)
Immediate Anesthesia Transfer of Care Note  Patient: Michelle Cabrera  Procedure(s) Performed: Procedure(s): RIGHT MIDDLE FINGER, RING FINGER, SMALL FINGER EXTENSOR DIGITORUM COMMUMIS STABILIZATION WITH RIGHT MIDDLE FINGER MCP ARTHROPLASTY, POSSIBLE RING FINGER AND SMALL FINGER MCP ARTHROPLASTY (Right)  Patient Location: PACU  Anesthesia Type:General  Level of Consciousness: awake, oriented, patient cooperative and lethargic  Airway & Oxygen Therapy: Patient Spontanous Breathing and Patient connected to nasal cannula oxygen  Post-op Assessment: Report given to RN and Post -op Vital signs reviewed and stable  Post vital signs: Reviewed and stable  Complications: No apparent anesthesia complications

## 2014-09-15 NOTE — Op Note (Signed)
See dictation#[id number] Amanda Pea MD

## 2014-09-15 NOTE — Anesthesia Preprocedure Evaluation (Addendum)
Anesthesia Evaluation  Patient identified by MRN, date of birth, ID band Patient awake    Reviewed: Allergy & Precautions, NPO status , Patient's Chart, lab work & pertinent test results  Airway Mallampati: I       Dental  (+) Teeth Intact   Pulmonary COPDCurrent Smoker,  breath sounds clear to auscultation  + decreased breath sounds      Cardiovascular hypertension, Rhythm:Regular Rate:Normal     Neuro/Psych    GI/Hepatic GERD-  ,  Endo/Other    Renal/GU      Musculoskeletal  (+) Arthritis -, Back pain , on narcotics   Abdominal   Peds  Hematology   Anesthesia Other Findings   Reproductive/Obstetrics                            Anesthesia Physical Anesthesia Plan  ASA: III  Anesthesia Plan:    Post-op Pain Management: MAC Combined w/ Regional for Post-op pain   Induction: Intravenous  Airway Management Planned: LMA  Additional Equipment:   Intra-op Plan:   Post-operative Plan: Extubation in OR  Informed Consent: I have reviewed the patients History and Physical, chart, labs and discussed the procedure including the risks, benefits and alternatives for the proposed anesthesia with the patient or authorized representative who has indicated his/her understanding and acceptance.     Plan Discussed with: CRNA and Surgeon  Anesthesia Plan Comments:         Anesthesia Quick Evaluation

## 2014-09-15 NOTE — Progress Notes (Signed)
Orthopedic Tech Progress Note Patient Details:  Michelle Cabrera 1946-04-09 [id number]  Ortho Devices Type of Ortho Device: Arm sling Ortho Device/Splint Interventions: Casandra Doffing 09/15/2014, 6:44 PM

## 2014-09-15 NOTE — H&P (Signed)
Michelle Cabrera is an 69 y.o. female.   Chief Complaint: presents for surgical Rheumatoid hand correction HPI: Patient presents for evaluation and treatment of the of their upper extremity predicament. The patient denies neck back chest or of abdominal pain. The patient notes that they have no lower extremity problems. The patient from primarily complains of the upper extremity pain noted.  Past Medical History  Diagnosis Date  . Hypertension   . Rheumatoid arthritis   . DDD (degenerative disc disease)   . Chronic back pain   . Closed compression fracture of L1 lumbar vertebra 11/2012  . COPD (chronic obstructive pulmonary disease)   . Anxiety   . Depression   . GERD (gastroesophageal reflux disease)     Past Surgical History  Procedure Laterality Date  . Lumbar disc surgery       X 3  . Kyphoplasty N/A 01/04/2013    Procedure: L1 Kyphoplasty;  Surgeon: Hewitt Shorts, MD;  Location: [facility];  Service: Neurosurgery;  Laterality: N/A;  L1 Kyphoplasty  . Tubal ligation    . Foot surgery    . Wrist fusion Right     30 yrs ago  . Kyphoplasty N/A 02/07/2014    Procedure: THORACIC SIX KYPHOPLASTY;  Surgeon: Hewitt Shorts, MD;  Location: [facility];  Service: Neurosurgery;  Laterality: N/A;  T6 Kyphoplasty     Family History  Problem Relation Age of Onset  . CAD Father   . CAD Mother   . Hypertension Mother   . Peripheral vascular disease Mother    Social History:  reports that she has been smoking Cigarettes.  She has a 25 pack-year smoking history. She does not have any smokeless tobacco history on file. She reports that she does not drink alcohol or use illicit drugs.  Allergies:  Allergies  Allergen Reactions  . Codeine Other (See Comments)    Other Reaction: lip tounge swell  . Gold-Containing Drug Products Anaphylaxis  . Adhesive [Tape]     Tears skin  . Dilaudid [Hydromorphone Hcl]     Made tongue swell    Medications Prior to Admission  Medication Sig  Dispense Refill  . amLODipine (NORVASC) 5 MG tablet Take 5 mg by mouth daily as needed (Blood pressure).    . Calcium Carbonate-Vit D-Min (CALCIUM-VITAMIN D-MINERALS) 600-400 MG-UNIT CHEW Chew 2 tablets by mouth 2 (two) times daily.     . DULoxetine (CYMBALTA) 30 MG capsule Take 30 mg by mouth at bedtime.     . DULoxetine (CYMBALTA) 60 MG capsule Take 60 mg by mouth every morning.    . etanercept (ENBREL) 50 MG/ML injection Inject 50 mg into the skin once a week. Monday    . Fluticasone Furoate-Vilanterol (BREO ELLIPTA) 100-25 MCG/INH AEPB Inhale 1 puff into the lungs every morning.    . furosemide (LASIX) 40 MG tablet Take 40 mg by mouth daily as needed for fluid.    Marland Kitchen gabapentin (NEURONTIN) 300 MG capsule Take 600 mg by mouth 3 (three) times daily as needed (leg pain).     Marland Kitchen ipratropium-albuterol (DUONEB) 0.5-2.5 (3) MG/3ML SOLN Take 3 mLs by nebulization 4 (four) times daily.    . Menthol-Methyl Salicylate (BEN GAY ARTHRITIS FORMULA) 8-30 % CREA Apply 1 application topically daily as needed (for back pain).    . Methotrexate, PF, 25 MG/0.4ML SOAJ Inject 50 mg into the skin once a week. On Monday    . omeprazole (PRILOSEC) 20 MG capsule Take 20 mg by  mouth 2 (two) times daily.     Marland Kitchen oxyCODONE-acetaminophen (PERCOCET) 7.5-325 MG per tablet Take 1 tablet by mouth every 4 (four) hours as needed for pain.    . predniSONE (DELTASONE) 5 MG tablet Take 5 mg by mouth daily with breakfast.    . torsemide (DEMADEX) 5 MG tablet Take 5 mg by mouth daily.    . traZODone (DESYREL) 50 MG tablet Take 50-100 mg by mouth at bedtime.     . vitamin C (ASCORBIC ACID) 500 MG tablet Take 500 mg by mouth daily.    Marland Kitchen acetaminophen (TYLENOL) 500 MG tablet Take 1,000 mg by mouth every 6 (six) hours as needed for mild pain.    Marland Kitchen zoledronic acid (RECLAST) 5 MG/100ML SOLN injection Inject 5 mg into the vein See admin instructions. *uses once a year*      No results found for this or any previous visit (from the past 48  hour(s)). No results found.  Review of Systems  Eyes: Negative.   Respiratory: Negative.   Cardiovascular: Negative.   Gastrointestinal: Negative.   Genitourinary: Negative.   Neurological: Negative.     Blood pressure 153/63, pulse 86, temperature 98.6 F (37 C), temperature source Oral, resp. rate 22, height 5\' 2"  (1.575 m), weight 70.846 kg (156 lb 3 oz), SpO2 100 %. Physical Exam  R A deformity right hand with ulna sublux and loss of function Prior TWF in ulnar deviation Normal sensation and refill The patient is alert and oriented in no acute distress the patient complains of pain in the affected upper extremity.  The patient is noted to have a normal HEENT exam.  Lung fields show equal chest expansion and no shortness of breath  abdomen exam is nontender without distention.  Lower extremity examination does not show any fracture dislocation or blood clot symptoms.  Pelvis is stable neck and back are stable and nontender Assessment/Plan We are planning surgery for your upper extremity. The risk and benefits of surgery include risk of bleeding infection anesthesia damage to normal structures and failure of the surgery to accomplish its intended goals of relieving symptoms and restoring function with this in mind we'll going to proceed. I have specifically discussed with the patient the pre-and postoperative regime and the does and don'ts and risk and benefits in great detail. Risk and benefits of surgery also include risk of dystrophy chronic nerve pain failure of the healing process to go onto completion and other inherent risks of surgery The relavent the pathophysiology of the disease/injury process, as well as the alternatives for treatment and postoperative course of action has been discussed in great detail with the patient who desires to proceed.  We will do everything in our power to help you (the patient) restore function to the upper extremity. Is a pleasure to see this  patient today.   09/15/2014, 1:52 PM

## 2014-09-15 NOTE — Anesthesia Procedure Notes (Addendum)
Anesthesia Regional Block:  Supraclavicular block  Pre-Anesthetic Checklist: ,, timeout performed, Correct Patient, Correct Site, Correct Laterality, Correct Procedure, Correct Position, site marked, Risks and benefits discussed,  Surgical consent,  Pre-op evaluation,  At surgeon's request and post-op pain management  Laterality: Right and Upper  Prep: chloraprep and alcohol swabs       Needles:   Needle Type: Echogenic Needle     Needle Length: 9cm 9 cm Needle Gauge: 21 and 21 G  Needle insertion depth: 5 cm   Additional Needles:  Procedures: ultrasound guided (picture in chart) and nerve stimulator Supraclavicular block Narrative:  Start time: 09/15/2014 1:30 PM End time: 09/15/2014 1:45 PM Injection made incrementally with aspirations every 5 mL.  Performed by: Personally  Anesthesiologist: MASSAGEE, TERRY  Additional Notes: Tolerated well   Procedure Name: LMA Insertion Date/Time: 09/15/2014 2:11 PM Performed by: Angelica Pou Pre-anesthesia Checklist: Patient identified, Timeout performed, Emergency Drugs available, Suction available and Patient being monitored Patient Re-evaluated:Patient Re-evaluated prior to inductionOxygen Delivery Method: Circle system utilized Preoxygenation: Pre-oxygenation with 100% oxygen Intubation Type: IV induction Ventilation: Mask ventilation without difficulty LMA: LMA inserted LMA Size: 4.0 Number of attempts: 1 Placement Confirmation: breath sounds checked- equal and bilateral and positive ETCO2 Tube secured with: Tape Dental Injury: Teeth and Oropharynx as per pre-operative assessment

## 2014-09-16 ENCOUNTER — Encounter (HOSPITAL_COMMUNITY): Payer: Self-pay | Admitting: Orthopedic Surgery

## 2014-09-16 DIAGNOSIS — M20091 Other deformity of right finger(s): Secondary | ICD-10-CM | POA: Diagnosis not present

## 2014-09-16 MED ORDER — OXYCODONE HCL 5 MG PO TABS: 5.0000 mg | ORAL_TABLET | ORAL | Status: DC | PRN

## 2014-09-16 MED ORDER — CEPHALEXIN 500 MG PO CAPS: 500.0000 mg | ORAL_CAPSULE | Freq: Four times a day (QID) | ORAL | Status: DC

## 2014-09-16 NOTE — Progress Notes (Signed)
UR completed 

## 2014-09-16 NOTE — Discharge Summary (Signed)
  Patient has been seen and examined. Patient has pain appropriate to his injury/process. Patient denies new complaints at this present time. I have discussed his care with nursing staff. Patient is appropriate and alert.  We reviewed vital signs and intake output which are stable.  The upper extremity is neurovascularly intact. Refill is normal. There is no signs of compartment syndrome. There is no signs of dystrophy. There is normal sensation.  I have spent a  great deal of time discussing range of motion edema control and other techniques to decrease edema and promote flexion extension of the fingers. Patient understands the importance of elevation range of motion massage and other measures to lessen pain and prevent swelling.  We have discussed with the patient shoulder range of motion to prevent adhesive capsulitis.  The remainder of the examination is normal today without complicating feature.  Drain was removed without difficulty  Patient will be discharged home. Will plan to see the patient back in the off as per discharge instructions (please see discharge instructions).  Patient had an uneventful hospital course. At the time of discharge patient is stable awake alert and oriented in no acute distress. Regular diet will be continued and has been tolerated. Patient will notify should have problems occur. There is no signs of DVT infection or other complication at this juncture.  All questions have been incurred and answered.   Final diagnosis: Status post rheumatoid reconstruction right hand with extensor realignment of the middle ring and small fingers  She looks great. We'll discharge her home after her new and antibiotic dose  Michall Noffke M.D.

## 2014-09-16 NOTE — Discharge Instructions (Signed)

## 2014-09-16 NOTE — Op Note (Signed)
NAMEKATELINE, KINKADE NO.:  [id number]  MEDICAL RECORD NO.:  [id number]  LOCATION:  [id number]                        FACILITY:  [facility]  PHYSICIAN:  Dionne Ano. 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  PROCEDURE: 1. Right middle finger extensor digitorum communis realignment     procedure with tendon transfer utilizing a tendon strip to the     radial collateral ligament. 2. Right middle finger metacarpophalangeal joint arthrotomy,     synovectomy and debridement. 3. Right ring finger extensor tendon realignment procedure with tendon     transfer of an extensor digitorum communis slip to the radial     collateral ligament back upon itself. 4. Arthrotomy, synovectomy, right ring finger metacarpophalangeal     joint. 5. Right small finger extensor digitorum communis realignment     procedure with tendon transfer to the radial collateral ligament     utilizing the extensor digitorum communis slip for centralization     purposes. 6. Arthrotomy, synovectomy, right small finger. 7. Ulnar sagittal band release and radial sagittal band imbrication,     right middle finger, ring finger and small finger.  SURGEON:  Dionne Ano. Amanda Pea, M.D.  ASSISTANT:  None.  COMPLICATION:  None.  ANESTHESIA:  General, preoperative block.  TOURNIQUET TIME:  Less than 2 hours.  INDICATIONS:  This is a pleasant 69 year old female, who  presents with the above-mentioned diagnosis.  I have counseled her in regards to risks and benefits of surgery including risk of infection, bleeding, anesthesia, damage to normal structures, and failure of surgery to accomplish its intended goals in relieving symptoms and restoring function.  With this in mind, she desires to proceed.  All questions have been encouraged and answered preoperatively.  I have discussed with the patient that her job/our ability to realign the tendons is a bit harder given the ulnar deviatory position of her hand after total wrist fusion at [facility] many years ago.  This in the fact changes the extensor pull and makes her job a bit more difficult as the shortest distance between two points is a straight line.  I have counseled her in regards to this in the options, issues and other concerns.  With this in mind, she desires to proceed.  I have discussed her options of MCP joint arthroplasty as well as options of simply debriding the joint based upon the cartilaginous integrity and she understands this.  OPERATION IN DETAIL:  The patient was seen by myself and Anesthesia, taken to the operative suite, and underwent smooth induction of block anesthetic followed by general anesthetic.  The block was administered by Dr. Jacklynn Bue.  Following this, 2 g of Ancef were administered and the patient then underwent sterile  prep and drape after general anesthesia was induced.  Ten minutes surgical Betadine scrub paint was accomplished.  Arm had previously been marked.  Time-out called and pre and postop checklist completed.  At this time, I evaluated the joints, they were fairly smooth, no crepitans, no excessive synovitis.  There were certainly some subluxation and dislocation of the EDC tendons of the middle, ring and small.  At this time, I then made an incision about the third webspace.  Dissection was carried down and the middle finger was then accessed.  The  [facility] had a patulous sagittal band radially and a very tight sagittal band ulnarly.  The ulnar sagittal band was released. Following this, I then harvested a strip of EDC tendon proximally, retrieved it overlying the MCP joint and set the patient up for a repair of reconstruction.  Following this, I then very carefully and cautiously performed arthrotomy, synovectomy of the MCP joint.  The patient underwent an arthrotomy and synovectomy.  She did have cartilaginous integrity to a reasonable degree and thus, I felt that we would preserve her joint.  At this time, we performed an aggressive arthrotomy and synovectomy, and once this was complete, I then performed a tendon transfer of the EDC to the radial collateral ligament back upon the [facility] to centralize the tendon.  This was inset with FiberWire stitching.  I buried the stitch appropriately.  Following this, I then performed sagittal band repair radially and imbrication, the patient tolerated this well and no new complicating features.  The ulnar sagittal band was released and left in a released state.  This centralized the tendon and all looked well.  I checked the range of motion and all looked good.  I was pleased with this.  The centralization looked quite nice.  I did release a little bit of the sagittal band proximally to make sure there were no deforming forces.  Following this, I then undermined and subcutaneously dissected just over the peritenon region and accessed the ring finger.  The ring finger was accessed.  The small strip of tendon was harvested proximally for tendon transfer.  Following this, the ulnar sagittal band was released and the radial sagittal band was then left patulous.  I opened the joint and performed an arthrotomy and synovectomy in an aggressive fashion.  The joint looked reasonably well for preservation purposes.  Thus, I performed a tendon transfer of the harvested EDC slip around the radial  collateral ligament.  This was very carefully dissected and placed around the radial collateral ligament, then back upon itself.  This centralized the tendon nicely.  Thus, the tendon transfer of a second variety about the ring finger was accomplished to harness the tendinous, centralized state.  The ulnar sagittal band was left release.  The radial sagittal band underwent a repair in imbrication technique with FiberWire stitching of the 4-0 variety.  The middle finger also underwent a sagittal band repair of the 4-0 variety as its patulous nature needed imbricating.  Following this, I then irrigated copiously and loosely closed the very thin skin.  I was very meticulous and careful with the skin and handling of the tissues as not to damage them.  There was no sharp retractors placed.  Following this, an incision was made over the MCP joint of the small finger.  Dissection was carried down.  The EDC was similarly identified. A small strip of tendon was harvested off the juncture and set for tendon transfer.  Following this, I performed an  arthrotomy and synovectomy, the joint was reasonably well maintained.  Following the arthrotomy and synovectomy, I then performed tendon transfer of the EDC juncture, a slip to the main tendon around the radial collateral ligament and then back upon itself.  Secured the [employer].  The centralized tendon quite nice and I was pleased with this.  I then left the ulnar sagittal band open and the radial sagittal band underwent repair and imbrication.  This was done similarly to the other wounds with a small incision followed by imbrication technique with pants-over- vest placement of FiberWire suture.  The patient looked well.  All fingers were centralized.  The EDCs were now in a centralized, non-subluxed or dislocated position.  Certainly, her skin is very thin, we handled it very carefully.  The wounds were closed with tourniquet deflated and a  small drain was placed in the form of a vessel loop drain.  She tolerated the procedure well. There were no complicating features.  The patient was irrigated.  She was given antibiotics and to keep her overnight for IV antibiotics, and she understands this.  Our plan postoperatively will be to keep her still for 4 weeks in an extension and radially deviated position.  Her splint of course was placed in this fashion today with an overwrap and radial imbricating bandage.  I will plan for suture removal and casting in 2 weeks.  At 4 weeks, we will make her removable splint therapy and begin interval range of motion to the PIP and DIP joints and a little bit of MCP motion in 6-8 weeks.  We will move for hard motion.  A 50% motion at the MCP joints and no ulnar deviation is our goal and rule.  I have discussed with the patient these issues.  Unfortunately given the advanced ulnar deviation, there was always tendency of her to stretch out and this was my concern.  Nevertheless, we have given her the best operation possible today to try to give her some better meaningful function as she cannot extend her fingers due to the dislocating tendons.  Do's and don'ts have been discussed and once again, 4 weeks of mobilization between 4 and 6 weeks.  Discussed with motion at the MPs, PIP and DIPs moving hard and at 6-8 weeks, full go and try to really get and move aggressively.  Do's and don'ts have been discussed and all questions were encouraged and answered.  I do want to give her tissue time to heal as she is going to have a tendency to stretch out as most rheumatoid patients do.     Dionne Ano. Amanda Pea, M.D.     [facility]  D:  09/15/2014  T:  09/16/2014  Job:  [id number]

## 2014-11-18 ENCOUNTER — Encounter
Admit: 2014-11-18 | Disposition: A | Discharge status: Home / Self Care | Payer: Self-pay | Attending: Surgery | Admitting: Surgery

## 2014-11-20 NOTE — Op Note (Signed)
PATIENT NAME:  Michelle Cabrera, Michelle Cabrera DATE OF BIRTH:  [birth year]  DATE OF PROCEDURE:  07/02/2011  PREOPERATIVE DIAGNOSIS: Lumbar spinal stenosis with degenerative disk disease and spondylolisthesis L3-4 below level of prior fusion.  POSTOPERATIVE DIAGNOSIS: Lumbar spinal stenosis with degenerative disk disease and spondylolisthesis L3-4 below level of prior fusion.  PROCEDURES: 1. Transverse lumbar interbody fusion L3-4. 2. Segmental spinal instrumentation L2-3, L3-4. 3. Application of interbody device L3-4. 4. Removal of spinal implant, deep rods bilaterally at L2-3.  SURGEON: Michelle Jock. Gerrit Heck, Michelle Cabrera   ASSISTANT: Abby Potash, PA-C  ANESTHESIA: General.  ESTIMATED BLOOD LOSS: 500 mL.  REPLACEMENT: 2 liters of Crystalloid.  DRAINS: None.  COMPLICATIONS: None.  IMPLANTS USED: ST-360, Ardis trabecular metal implant measuring 11 mm x 26 mm x 10 mm.  BRIEF CLINICAL NOTE AND PATHOLOGY: The patient had had prior lumbar surgery with fusion at L2-3. She had done well, developed increasing pain and then was found to have spinal stenosis with spondylolisthesis at L3-4. Options, risks, and benefits were discussed. She had known osteoporosis and was being treated for this. It was felt with instability that decompression or posterolateral fusion was warranted.   At the time of the procedure, there was marked stenosis. There was very significant neural compression.  There was good fixation of the hardware. The fusion at L2-3 appeared stable.  PROCEDURE: Adequate general anesthesia, supine position, prone position, all prominences well padded. Routine prepping and draping. Appropriate time-out was called. Lateral fluoroscopy was used. The old incision was opened. Subcutaneous tissues were dissected and the hardware was exposed. The screws and rods were removed.   The incisions were irrigated throughout the procedure with antibiotic solution.   The laminectomy decompression was then  performed at L3-4. There was marked scarring. A small dural laceration occurred while we were removing scar tissue on the left side. This was repaired without any evidence of further dural leak.   The nerve roots were carefully decompressed and protected. The disk space was then entered on the left side and cleaning was continued until three passes revealed no further disk material. Lateral fluoroscopy was used. Endplate shaving was performed with sizing. The implant was then placed. It seated very nicely.  Attention was then turned to the pedicles which were instrumented using the nerve monitoring pedicle probe. This was advanced nicely into the vertebral body using fluoroscopic guidance as well. The pedicle fill was used followed by insertion of the pedicle screws. Attention was then turned to applying the rods which were used to connect L2 to L3 to L4. The incision was thoroughly irrigated, hardware had excellent fixation. A transverse connector was placed. AP and lateral views showed good positioning. The spinal cord and nerve roots were again examined and there was no evidence of further neural compression or dural leak. Incisions were again thoroughly irrigated. The lateral gutters had been tacked with bone graft and CopiOs. The facet joint on the left side had been removed and on the right side had been decorticated. The fascia was closed with #2 quill. The sub-Q was closed with 0 quill. Skin was closed with staples. Soft sterile dressing was applied. Sponge, instrument, and needle counts were reported as correct prior to and after wound closure. The patient was awakened and taken to the PAC-U having tolerated the procedure well. ____________________________ Michelle Jock. Gerrit Heck, Michelle Cabrera jcc:drc D: 07/02/2011 12:25:26 ET T: 07/02/2011 15:25:37 ET JOB#: [id number] Michelle Cabrera ELECTRONICALLY SIGNED 08/15/2011 8:11

## 2014-12-02 ENCOUNTER — Encounter: Payer: Medicare Other | Attending: Surgery | Admitting: Surgery

## 2014-12-02 DIAGNOSIS — S81811A Laceration without foreign body, right lower leg, initial encounter: Secondary | ICD-10-CM | POA: Insufficient documentation

## 2014-12-02 DIAGNOSIS — I10 Essential (primary) hypertension: Secondary | ICD-10-CM | POA: Diagnosis not present

## 2014-12-02 DIAGNOSIS — M069 Rheumatoid arthritis, unspecified: Secondary | ICD-10-CM | POA: Diagnosis not present

## 2014-12-02 DIAGNOSIS — L97212 Non-pressure chronic ulcer of right calf with fat layer exposed: Secondary | ICD-10-CM | POA: Diagnosis not present

## 2014-12-02 DIAGNOSIS — R600 Localized edema: Secondary | ICD-10-CM | POA: Diagnosis not present

## 2014-12-02 DIAGNOSIS — F17218 Nicotine dependence, cigarettes, with other nicotine-induced disorders: Secondary | ICD-10-CM | POA: Insufficient documentation

## 2014-12-02 DIAGNOSIS — J449 Chronic obstructive pulmonary disease, unspecified: Secondary | ICD-10-CM | POA: Diagnosis not present

## 2014-12-02 DIAGNOSIS — K219 Gastro-esophageal reflux disease without esophagitis: Secondary | ICD-10-CM | POA: Diagnosis not present

## 2014-12-02 DIAGNOSIS — X58XXXA Exposure to other specified factors, initial encounter: Secondary | ICD-10-CM | POA: Diagnosis not present

## 2014-12-02 NOTE — Progress Notes (Signed)
Michelle, Cabrera ([id number]) Visit Report for 12/02/2014 Arrival Information Details Patient Name: Michelle Cabrera, Michelle Cabrera. Date of Service: 12/02/2014 11:30 AM Medical Record Number: [id number] Patient Account Number: [id number] Date of Birth/Sex: [birth year] (69 y.o. Female) Treating RN: Curtis Sites Primary Care Physician: Einar Crow Other Clinician: Referring Physician: Einar Crow Treating Physician/Extender: Rudene Re in Treatment: 2 Visit Information History Since Last Visit Added or deleted any medications: No Patient Arrived: Ambulatory Any new allergies or adverse reactions: No Arrival Time: 11:25 Had a fall or experienced change in No Accompanied By: self activities of daily living that may affect Transfer Assistance: None risk of falls: Patient Identification Verified: Yes Signs or symptoms of abuse/neglect since last No Secondary Verification Process Yes visito Completed: Hospitalized since last visit: No Patient Has Alerts: Yes Pain Present Now: Yes Electronic Signature(s) Signed: 12/02/2014 2:47:10 PM By: Curtis Sites Entered By: Curtis Sites on 12/02/2014 11:34:05 Michelle Cabrera ([id number]) -------------------------------------------------------------------------------- Encounter Discharge Information Details Patient Name: Michelle Cabrera Date of Service: 12/02/2014 11:30 AM Medical Record Number: [id number] Patient Account Number: [id number] Date of Birth/Sex: 09/30/1945 (69 y.o. Female) Treating RN: Huel Coventry Primary Care Physician: Einar Crow Other Clinician: Referring Physician: Einar Crow Treating Physician/Extender: Rudene Re in Treatment: 2 Encounter Discharge Information Items Discharge Pain Level: 0 Discharge Condition: Stable Ambulatory Status: Ambulatory Discharge Destination: Home Private Transportation: Auto Accompanied By: self Schedule Follow-up Appointment: Yes Medication  Reconciliation completed and No provided to Patient/Care Geryl Dohn: Clinical Summary of Care: Electronic Signature(s) Signed: 12/02/2014 2:43:41 PM By: Elliot Gurney, RN, BSN, Kim RN, BSN Entered By: Elliot Gurney, RN, BSN, Kim on 12/02/2014 12:02:43 Michelle Cabrera ([id number]) -------------------------------------------------------------------------------- Lower Extremity Assessment Details Patient Name: Michelle Cabrera Date of Service: 12/02/2014 11:30 AM Medical Record Number: [id number] Patient Account Number: [id number] Date of Birth/Sex: 10-22-1945 (69 y.o. Female) Treating RN: Curtis Sites Primary Care Physician: Einar Crow Other Clinician: Referring Physician: Einar Crow Treating Physician/Extender: Rudene Re in Treatment: 2 Edema Assessment Assessed: [Left: No] [Right: No] E[Left: dema] [Right: :] Calf Left: Right: Point of Measurement: 33 cm From Medial Instep cm 32.5 cm Ankle Left: Right: Point of Measurement: 10 cm From Medial Instep cm 18.2 cm Vascular Assessment Pulses: Posterior Tibial Palpable: [Right:Yes] Dorsalis Pedis Palpable: [Right:Yes] Extremity colors, hair growth, and conditions: Extremity Color: [Right:Hyperpigmented] Hair Growth on Extremity: [Right:No] Temperature of Extremity: [Right:Warm] Capillary Refill: [Right:< 3 seconds] Toe Nail Assessment Left: Right: Thick: No Discolored: No Deformed: No Improper Length and Hygiene: No Electronic Signature(s) Signed: 12/02/2014 2:47:10 PM By: Curtis Sites Entered By: Curtis Sites on 12/02/2014 11:39:27 Michelle Cabrera ([id number] Cabrera ([id number]) -------------------------------------------------------------------------------- Multi Wound Chart Details Patient Name: Michelle Cabrera Date of Service: 12/02/2014 11:30 AM Medical Record Number: [id number] Patient Account Number: [id number] Date of Birth/Sex: 22-Sep-1945 (69 y.o. Female) Treating RN: Huel Coventry Primary Care Physician: Einar Crow Other Clinician: Referring Physician: Einar Crow Treating Physician/Extender: Rudene Re in Treatment: 2 Vital Signs Height(in): 62 Pulse(bpm): 105 Weight(lbs): 151 Blood Pressure 132/57 (mmHg): Body Mass Index(BMI): 28 Temperature(F): 98.2 Respiratory Rate 18 (breaths/min): Photos: [1:No Photos] [N/A:N/A] Wound Location: [1:Right Lower Leg - Medial N/A] Wounding Event: [1:Trauma] [N/A:N/A] Primary Etiology: [1:Trauma, Other] [N/A:N/A] Comorbid History: [1:Chronic Obstructive Pulmonary Disease (COPD), Lupus Erythematosus, Rheumatoid Arthritis, Osteoarthritis, Neuropathy] [N/A:N/A] Date Acquired: [1:11/03/2014] [N/A:N/A] Weeks of Treatment: [1:2] [N/A:N/A] Wound Status: [1:Open] [N/A:N/A] Measurements L x W x D 2.1x3.4x0.2 [N/A:N/A] (cm) Area (cm) : [1:5.608] [N/A:N/A] Volume (cm) : [1:1.122] [N/A:N/A] % Reduction in Area: [1:53.00%] [N/A:N/A] % Reduction  in Volume: 6.00% [N/A:N/A] Classification: [1:Full Thickness Without Exposed Support Structures] [N/A:N/A] Exudate Amount: [1:Small] [N/A:N/A] Exudate Type: [1:Serous] [N/A:N/A] Exudate Color: [1:amber] [N/A:N/A] Wound Margin: [1:Flat and Intact] [N/A:N/A] Granulation Amount: [1:Medium (34-66%)] [N/A:N/A] Granulation Quality: [1:Pink] [N/A:N/A] Necrotic Amount: [1:Medium (34-66%)] [N/A:N/A] Exposed Structures: [N/A:N/A] Fascia: No Fat: No Tendon: No Muscle: No Joint: No Bone: No Limited to Skin Breakdown Epithelialization: None N/A N/A Periwound Skin Texture: Edema: Yes N/A N/A Excoriation: No Induration: No Callus: No Crepitus: No Fluctuance: No Friable: No Rash: No Scarring: No Periwound Skin Moist: Yes N/A N/A Moisture: Maceration: No Dry/Scaly: No Periwound Skin Color: Atrophie Blanche: No N/A N/A Cyanosis: No Ecchymosis: No Erythema: No Hemosiderin Staining: No Mottled: No Pallor: No Rubor: No Tenderness on Yes N/A  N/A Palpation: Wound Preparation: Ulcer Cleansing: N/A N/A Rinsed/Irrigated with Saline Topical Anesthetic Applied: Other: lidocaine 4% Treatment Notes Electronic Signature(s) Signed: 12/02/2014 2:43:41 PM By: Elliot Gurney, RN, BSN, Kim RN, BSN Entered By: Elliot Gurney, RN, BSN, Kim on 12/02/2014 11:44:55 Michelle Cabrera ([id number]) -------------------------------------------------------------------------------- Multi-Disciplinary Care Plan Details Patient Name: Michelle Cabrera Date of Service: 12/02/2014 11:30 AM Medical Record Number: [id number] Patient Account Number: [id number] Date of Birth/Sex: [birth year] (69 y.o. Female) Treating RN: Huel Coventry Primary Care Physician: Einar Crow Other Clinician: Referring Physician: Einar Crow Treating Physician/Extender: Rudene Re in Treatment: 2 Active Inactive Orientation to the Wound Care Program Nursing Diagnoses: Knowledge deficit related to the wound healing center program Goals: Patient/caregiver will verbalize understanding of the Wound Healing Center Program Date Initiated: 11/18/2014 Goal Status: Active Interventions: Provide education on orientation to the wound center Notes: Wound/Skin Impairment Nursing Diagnoses: Impaired tissue integrity Goals: Ulcer/skin breakdown will heal within 14 weeks Date Initiated: 11/18/2014 Goal Status: Active Interventions: Assess patient/caregiver ability to obtain necessary supplies Notes: Electronic Signature(s) Signed: 12/02/2014 2:43:41 PM By: Elliot Gurney, RN, BSN, Kim RN, BSN Entered By: Elliot Gurney, RN, BSN, Kim on 12/02/2014 11:44:42 Michelle Cabrera ([id number]) -------------------------------------------------------------------------------- Pain Assessment Details Patient Name: Michelle Cabrera Date of Service: 12/02/2014 11:30 AM Medical Record Number: [id number] Patient Account Number: [id number] Date of Birth/Sex: [birth year] (69 y.o. Female) Treating RN: Curtis Sites Primary Care Physician: Einar Crow Other Clinician: Referring Physician: Einar Crow Treating Physician/Extender: Rudene Re in Treatment: 2 Active Problems Location of Pain Severity and Description of Pain Patient Has Paino Yes Site Locations Pain Location: Pain in Ulcers With Dressing Change: Yes Duration of the Pain. Constant / Intermittento Constant Character of Pain Describe the Pain: Aching Pain Management and Medication Current Pain Management: Electronic Signature(s) Signed: 12/02/2014 2:47:10 PM By: Curtis Sites Entered By: Curtis Sites on 12/02/2014 11:35:24 Michelle Cabrera ([id number]) -------------------------------------------------------------------------------- Patient/Caregiver Education Details Patient Name: Michelle Cabrera Date of Service: 12/02/2014 11:30 AM Medical Record Number: [id number] Patient Account Number: [id number] Date of Birth/Gender: 1946/03/11 (69 y.o. Female) Treating RN: Huel Coventry Primary Care Physician: Einar Crow Other Clinician: Referring Physician: Einar Crow Treating Physician/Extender: Rudene Re in Treatment: 2 Education Assessment Education Provided To: Patient Education Topics Provided Wound/Skin Impairment: Handouts: Other: wound care as ordered Methods: Demonstration, Explain/Verbal Responses: State content correctly Electronic Signature(s) Signed: 12/02/2014 2:43:41 PM By: Elliot Gurney, RN, BSN, Kim RN, BSN Entered By: Elliot Gurney, RN, BSN, Kim on 12/02/2014 12:04:16 Michelle Cabrera ([id number]) -------------------------------------------------------------------------------- Wound Assessment Details Patient Name: Michelle Cabrera Date of Service: 12/02/2014 11:30 AM Medical Record Number: [id number] Patient Account Number: [id number] Date of Birth/Sex: 1945-08-19 (69 y.o. Female) Treating RN: Curtis Sites Primary Care Physician: Einar Crow Other  Clinician: Referring Physician: Einar Crow  Treating Physician/Extender: Rudene Re in Treatment: 2 Wound Status Wound Number: 1 Primary Trauma, Other Etiology: Wound Location: Right Lower Leg - Medial Wound Open Wounding Event: Trauma Status: Date Acquired: 11/03/2014 Comorbid Chronic Obstructive Pulmonary Disease Weeks Of Treatment: 2 History: (COPD), Lupus Erythematosus, Clustered Wound: No Rheumatoid Arthritis, Osteoarthritis, Neuropathy Photos Photo Uploaded By: Curtis Sites on 12/02/2014 13:54:56 Wound Measurements Length: (cm) 2.1 Width: (cm) 3.4 Depth: (cm) 0.2 Area: (cm) 5.608 Volume: (cm) 1.122 % Reduction in Area: 53% % Reduction in Volume: 6% Epithelialization: None Tunneling: No Undermining: No Wound Description Full Thickness Without Exposed Classification: Support Structures Wound Margin: Flat and Intact Exudate Small Amount: Exudate Type: Serous Exudate Color: amber Foul Odor After Cleansing: No Wound Bed Granulation Amount: Medium (34-66%) Exposed Structure KADEY, MIHALIC. ([id number]) Granulation Quality: Pink Fascia Exposed: No Necrotic Amount: Medium (34-66%) Fat Layer Exposed: No Necrotic Quality: Adherent Slough Tendon Exposed: No Muscle Exposed: No Joint Exposed: No Bone Exposed: No Limited to Skin Breakdown Periwound Skin Texture Texture Color No Abnormalities Noted: No No Abnormalities Noted: No Callus: No Atrophie Blanche: No Crepitus: No Cyanosis: No Excoriation: No Ecchymosis: No Fluctuance: No Erythema: No Friable: No Hemosiderin Staining: No Induration: No Mottled: No Localized Edema: Yes Pallor: No Rash: No Rubor: No Scarring: No Temperature / Pain Moisture Tenderness on Palpation: Yes No Abnormalities Noted: No Dry / Scaly: No Maceration: No Moist: Yes Wound Preparation Ulcer Cleansing: Rinsed/Irrigated with Saline Topical Anesthetic Applied: Other: lidocaine 4%, Treatment  Notes Wound #1 (Right, Medial Lower Leg) 1. Cleansed with: Clean wound with Normal Saline 2. Anesthetic Topical Lidocaine 4% cream to wound bed prior to debridement 3. Peri-wound Care: Skin Prep 4. Dressing Applied: Hydrafera Blue 5. Secondary Dressing Applied Non-Adherent pad 6. Footwear/Offloading device applied Tubigrip 7. Secured with Tape ANISA, LEANOS ([id number]) Notes double layer of tubigrip D Electronic Signature(s) Signed: 12/02/2014 2:47:10 PM By: Curtis Sites Entered By: Curtis Sites on 12/02/2014 11:38:08 Michelle Cabrera ([id number]) -------------------------------------------------------------------------------- Vitals Details Patient Name: Michelle Cabrera Date of Service: 12/02/2014 11:30 AM Medical Record Number: [id number] Patient Account Number: [id number] Date of Birth/Sex: [birth year] (69 y.o. Female) Treating RN: Curtis Sites Primary Care Physician: Einar Crow Other Clinician: Referring Physician: Einar Crow Treating Physician/Extender: Rudene Re in Treatment: 2 Vital Signs Time Taken: 11:35 Temperature (F): 98.2 Height (in): 62 Pulse (bpm): 105 Weight (lbs): 151 Respiratory Rate (breaths/min): 18 Body Mass Index (BMI): 27.6 Blood Pressure (mmHg): 132/57 Reference Range: 80 - 120 mg / dl Electronic Signature(s) Signed: 12/02/2014 2:47:10 PM By: Curtis Sites Entered By: Curtis Sites on 12/02/2014 11:35:58

## 2014-12-02 NOTE — Progress Notes (Signed)
CASIDEE, BRANSON ([id number]) Visit Report for 12/02/2014 Chief Complaint Document Details Patient Name: Michelle Cabrera, Michelle Cabrera. Date of Service: 12/02/2014 11:30 AM Medical Record Number: [id number] Patient Account Number: [id number] Date of Birth/Sex: 1946/03/15 (69 y.o. Female) Treating RN: Primary Care Physician: Einar Crow Other Clinician: Referring Physician: Einar Crow Treating Physician/Extender: Rudene Re in Treatment: 2 Information Obtained from: Patient Chief Complaint Patient presents to the wound care center for a consult due non healing wound Pleasant 69 year old patient who comes with a history of having an injury to her right lower extremity approximately 2 weeks ago. She hit a briar bush and injured this area. Electronic Signature(s) Signed: 12/02/2014 12:08:58 PM By: Evlyn Kanner MD, FACS Entered By: Evlyn Kanner on 12/02/2014 11:53:54 Michelle Cabrera ([id number]) -------------------------------------------------------------------------------- Debridement Details Patient Name: Michelle Cabrera Date of Service: 12/02/2014 11:30 AM Medical Record Number: [id number] Patient Account Number: [id number] Date of Birth/Sex: 08-20-1945 (69 y.o. Female) Treating RN: Primary Care Physician: Einar Crow Other Clinician: Referring Physician: Einar Crow Treating Physician/Extender: Rudene Re in Treatment: 2 Debridement Performed for Wound #1 Right,Medial Lower Leg Assessment: Performed By: Physician Tristan Schroeder., MD Debridement: Debridement Pre-procedure Yes Verification/Time Out Taken: Start Time: 11:43 Pain Control: Lidocaine 5% topical ointment Level: Skin/Subcutaneous Tissue Total Area Debrided (L x 2 (cm) x 3 (cm) = 6 (cm) W): Tissue and other Viable, Non-Viable, Eschar, Fibrin/Slough, Subcutaneous material debrided: Instrument: Curette Bleeding: Minimum Hemostasis Achieved: Pressure End Time: 11:48 Procedural  Pain: 2 Post Procedural Pain: 0 Response to Treatment: Procedure was tolerated well Post Debridement Measurements of Total Wound Length: (cm) 2.1 Width: (cm) 3.4 Depth: (cm) 0.2 Volume: (cm) 1.122 Electronic Signature(s) Signed: 12/02/2014 12:08:58 PM By: Evlyn Kanner MD, FACS Entered By: Evlyn Kanner on 12/02/2014 11:53:43 Michelle Cabrera ([id number]) -------------------------------------------------------------------------------- HPI Details Patient Name: Michelle Cabrera Date of Service: 12/02/2014 11:30 AM Medical Record Number: [id number] Patient Account Number: [id number] Date of Birth/Sex: 10/03/1945 (69 y.o. Female) Treating RN: Primary Care Physician: Einar Crow Other Clinician: Referring Physician: Einar Crow Treating Physician/Extender: Rudene Re in Treatment: 2 History of Present Illness Location: right lower extremity Quality: Patient reports experiencing a dull pain to affected area(s). Severity: Patient states wound (s) are getting better. Duration: Patient has had the wound for < 2 weeks prior to presenting for treatment Timing: Pain in wound is Intermittent (comes and goes Context: The wound occurred when the patient was injured by a [employer] Modifying Factors: Other treatment(s) tried include: local care and doxycycline orally. Associated Signs and Symptoms: Patient reports having difficulty standing for long periods. HPI Description: once she injured her leg she has been applying Neosporin and covering it with a bandage. She has been on doxycycline and has taken a course of his treatment and will finish her tablets today. Her comorbidities include osteoporosis, coronary syndrome, neuralgia, spinal stenosis, hypertension, rheumatoid arthritis, GERD, COPD. Reviewed her list of her medications and note that she is on regular prednisone on a daily basis. She is also a smoker and smokes a pack of cigarettes daily and has been for  about 56 years. She does not have diabetes. She has been treated with doxycycline for the last 2 weeks. 11/25/2014 -- the edema and a right lower extremity had gone up significantly and her primary care doctor has increased her diuretic. she continues to smoke about 12-15 cigarettes a day and is going to start on nicotine patches tomorrow. She has completed her course of antibiotics. 12/02/2014 -- she says she's been taking  her diuretics regularly and is swelling of the leg has gone down remarkably. Uses the nicotine patches now and her smoking has gone down to a few cigarettes a day. Does complain of pain in her legs and calf and she thinks this may have something to do with her constant back problems and several back surgeries. Electronic Signature(s) Signed: 12/02/2014 12:08:58 PM By: Evlyn Kanner MD, FACS Entered By: Evlyn Kanner on 12/02/2014 11:55:01 Michelle Cabrera ([id number]) -------------------------------------------------------------------------------- Physical Exam Details Patient Name: Michelle Cabrera Date of Service: 12/02/2014 11:30 AM Medical Record Number: [id number] Patient Account Number: [id number] Date of Birth/Sex: 19-Aug-1945 (69 y.o. Female) Treating RN: Primary Care Physician: Einar Crow Other Clinician: Referring Physician: Einar Crow Treating Physician/Extender: Rudene Re in Treatment: 2 Constitutional . Pulse regular. Respirations normal and unlabored. Afebrile. . Eyes Nonicteric. Reactive to light. Ears, Nose, Mouth, and Throat Lips, teeth, and gums WNL.Marland Kitchen Moist mucosa without lesions . Neck supple and nontender. No palpable supraclavicular or cervical adenopathy. Normal sized without goiter. Respiratory WNL. No retractions.. Cardiovascular Pedal Pulses WNL. the edema in her right lower extremity has gone down markedly but she does have some pain in the region of her wound.. Integumentary (Hair, Skin) the ulcer base has  got some area covered with slough and that is a diffuse layer of fibrin all over.Marland Kitchen Psychiatric Judgement and insight Intact.. No evidence of depression, anxiety, or agitation.. Notes there is no evidence of DVT clinically and Homans sign is negative. Electronic Signature(s) Signed: 12/02/2014 12:08:58 PM By: Evlyn Kanner MD, FACS Entered By: Evlyn Kanner on 12/02/2014 11:56:36 Michelle Cabrera ([id number]) -------------------------------------------------------------------------------- Physician Orders Details Patient Name: Michelle Cabrera Date of Service: 12/02/2014 11:30 AM Medical Record Number: [id number] Patient Account Number: [id number] Date of Birth/Sex: 1945/09/24 (69 y.o. Female) Treating RN: Huel Coventry Primary Care Physician: Einar Crow Other Clinician: Referring Physician: Einar Crow Treating Physician/Extender: Rudene Re in Treatment: 2 Verbal / Phone Orders: Yes Clinician: Huel Coventry Read Back and Verified: Yes Diagnosis Coding Wound Cleansing Wound #1 Right,Medial Lower Leg o Clean wound with Normal Saline. Anesthetic Wound #1 Right,Medial Lower Leg o Topical Lidocaine 4% cream applied to wound bed prior to debridement Primary Wound Dressing Wound #1 Right,Medial Lower Leg o Hydrafera Blue Secondary Dressing Wound #1 Right,Medial Lower Leg o Boardered Foam Dressing Dressing Change Frequency Wound #1 Right,Medial Lower Leg o Change dressing every other day. Follow-up Appointments Wound #1 Right,Medial Lower Leg o Return Appointment in 1 week. Edema Control Wound #1 Right,Medial Lower Leg o Elevate legs to the level of the heart and pump ankles as often as possible o Tubigrip - double layer of Tubigrip Additional Orders / Instructions Wound #1 Right,Medial Lower Leg o Stop Smoking JAYLA, MACKIE ([id number]) Electronic Signature(s) Signed: 12/02/2014 12:08:58 PM By: Evlyn Kanner MD, FACS Signed: 12/02/2014  2:43:41 PM By: Elliot Gurney RN, BSN, Kim RN, BSN Entered By: Elliot Gurney, RN, BSN, Kim on 12/02/2014 11:48:35 Michelle Cabrera ([id number]) -------------------------------------------------------------------------------- Problem List Details Patient Name: Michelle Cabrera Date of Service: 12/02/2014 11:30 AM Medical Record Number: [id number] Patient Account Number: [id number] Date of Birth/Sex: [birth year] (69 y.o. 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 Female) Treating RN: Primary Care Physician: Einar Crow Other Clinician: Referring Physician: Einar Crow Treating Physician/Extender: Rudene Re in Treatment: 2 Subjective Chief Complaint Information obtained from Patient Patient presents to the wound care center for a consult due non healing wound Pleasant 69 year old patient who comes with a history of having an injury to her right lower extremity approximately 2 weeks ago. She hit a briar bush and injured this  area. History of Present Illness (HPI) The following HPI elements were documented for the patient's wound: Location: right lower extremity Quality: Patient reports experiencing a dull pain to affected area(s). Severity: Patient states wound (s) are getting better. Duration: Patient has had the wound for < 2 weeks prior to presenting for treatment Timing: Pain in wound is Intermittent (comes and goes Context: The wound occurred when the patient was injured by a [employer] Modifying Factors: Other treatment(s) tried include: local care and doxycycline orally. Associated Signs and Symptoms: Patient reports having difficulty standing for long periods. once she injured her leg she has been applying Neosporin and covering it with a bandage. She has been on doxycycline and has taken a course of his treatment and will finish her tablets today. Her comorbidities include osteoporosis, coronary syndrome, neuralgia, spinal stenosis, hypertension, rheumatoid arthritis, GERD, COPD. Reviewed her list of her medications and note that she is on regular prednisone on a daily basis. She is also a smoker and smokes a pack of cigarettes daily and has been for about 56 years. She does not have diabetes. She has been treated with doxycycline for the last 2 weeks. 11/25/2014 -- the edema and a right lower extremity had gone up significantly and her primary care doctor has increased her diuretic. she continues to smoke about 12-15 cigarettes a day and is going to start on nicotine patches tomorrow. She has completed her course of antibiotics. 12/02/2014 -- she says she's been taking her diuretics regularly and is swelling of the leg has gone down remarkably. Uses the nicotine patches now and her smoking has gone down to a few cigarettes a day. Does complain of pain in her legs and calf and she thinks this may have something to do with her constant back problems and several back surgeries. VERENICE, WESTRICH  ([id number]) Objective Constitutional Pulse regular. Respirations normal and unlabored. Afebrile. Vitals Time Taken: 11:35 AM, Height: 62 in, Weight: 151 lbs, BMI: 27.6, Temperature: 98.2 F, Pulse: 105 bpm, Respiratory Rate: 18 breaths/min, Blood Pressure: 132/57 mmHg. Eyes Nonicteric. Reactive to light. Ears, Nose, Mouth, and Throat Lips, teeth, and gums WNL.Marland Kitchen Moist mucosa without lesions . Neck supple and nontender. No palpable supraclavicular or cervical adenopathy. Normal sized without goiter. Respiratory WNL. No retractions.. Cardiovascular Pedal Pulses WNL. the edema in her right lower extremity has gone down markedly but she does have some pain in the region of her wound.Marland Kitchen Psychiatric Judgement and insight Intact.. No evidence of depression, anxiety, or agitation.. General Notes: there is no evidence of DVT clinically and Homans sign is negative. Integumentary (Hair, Skin) the ulcer base has got some area covered with slough and that is a diffuse layer of fibrin all over.. Wound #1 status is  Open. Original cause of wound was Trauma. The wound is located on the Right,Medial Lower Leg. The wound measures 2.1cm length x 3.4cm width x 0.2cm depth; 5.608cm^2 area and 1.122cm^3 volume. The wound is limited to skin breakdown. There is no tunneling or undermining noted. There is a small amount of serous drainage noted. The wound margin is flat and intact. There is medium (34-66%) pink granulation within the wound bed. There is a medium (34-66%) amount of necrotic tissue within the wound bed including Adherent Slough. The periwound skin appearance exhibited: Localized Edema, Moist. The periwound skin appearance did not exhibit: Callus, Crepitus, Excoriation, Fluctuance, Friable, Induration, Rash, Scarring, Dry/Scaly, Maceration, Atrophie Blanche, Cyanosis, Ecchymosis, Hemosiderin Staining, Mottled, Pallor, Rubor, Erythema. The periwound has tenderness on palpation. 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 We have again encouraged her to take her diuretics on a regular basis and also to continue with the nicotine patches and gave up smoking completely. If the pain and the calf increases significantly or there is no swelling and she is urged to go to the ER to make sure she does not have a deep vein thrombosis. She will come back and see [place] next week. Procedures Wound #1 Wound #1 is a Trauma, Other located on the Right,Medial Lower Leg . There was a Skin/Subcutaneous Tissue Debridement ([id number]) debridement with total area of 6 sq cm performed by Kohle Winner, Ignacia Felling., MD. with the following instrument(s): Curette to remove Viable and Non-Viable tissue/material including Fibrin/Slough, Eschar, and Subcutaneous after achieving pain control using Lidocaine 5% topical ointment. A time out was conducted prior to the start of the procedure. A Minimum amount of bleeding was controlled with Pressure. The procedure was tolerated well with a pain level of 2 throughout and a pain level of 0 following the procedure. Post Debridement Measurements: 2.1cm length x 3.4cm width x 0.2cm depth; 1.122cm^3 volume. Plan Wound Cleansing: Wound #1 Right,Medial Lower Leg: Clean wound with Normal Saline. Anesthetic: Wound #1 Right,Medial Lower Leg: Topical Lidocaine 4% cream applied to wound bed prior to debridement KAYTON, USSERY. ([id number]) Primary Wound Dressing: Wound #1 Right,Medial Lower Leg: Hydrafera Blue Secondary Dressing: Wound #1 Right,Medial Lower Leg: Boardered Foam  Dressing Dressing Change Frequency: Wound #1 Right,Medial Lower Leg: Change dressing every other day. Follow-up Appointments: Wound #1 Right,Medial Lower Leg: Return Appointment in 1 week. Edema Control: Wound #1 Right,Medial Lower Leg: Elevate legs to the level of the heart and pump ankles as often as possible Tubigrip - double layer of Tubigrip Additional Orders / Instructions: Wound #1 Right,Medial Lower Leg: Stop Smoking After sharp debridement with a curet the granulation tissue looks good and we will continue using Hydrofera Blue and a double layer of Tubigrip. We have again encouraged her to take her diuretics on a regular basis and also to continue with the nicotine patches and gave up smoking completely. If the pain and the calf increases significantly or there is no swelling and she is urged to go to the ER to make sure she does not have a deep vein thrombosis. She will come back and see [place] next week. Electronic Signature(s) Signed: 12/02/2014 12:08:58 PM By: Evlyn Kanner MD,  FACS Entered By: Evlyn Kanner on 12/02/2014 11:58:33

## 2014-12-06 ENCOUNTER — Other Ambulatory Visit: Payer: Self-pay | Admitting: Internal Medicine

## 2014-12-06 ENCOUNTER — Ambulatory Visit
Admission: RE | Admit: 2014-12-06 | Discharge: 2014-12-06 | Disposition: A | Discharge status: Home / Self Care | Payer: Medicare Other | Source: Ambulatory Visit | Attending: Internal Medicine | Admitting: Internal Medicine

## 2014-12-06 DIAGNOSIS — R600 Localized edema: Secondary | ICD-10-CM | POA: Insufficient documentation

## 2014-12-06 DIAGNOSIS — R609 Edema, unspecified: Secondary | ICD-10-CM

## 2014-12-08 ENCOUNTER — Encounter: Payer: Medicare Other | Admitting: Surgery

## 2014-12-08 DIAGNOSIS — L97212 Non-pressure chronic ulcer of right calf with fat layer exposed: Secondary | ICD-10-CM | POA: Diagnosis not present

## 2014-12-09 NOTE — Progress Notes (Signed)
Michelle, Cabrera ([id number]) Visit Report for 12/08/2014 Chief Complaint Document Details Patient Name: Michelle Cabrera, Michelle Cabrera 12/08/2014 11:45 Date of Service: AM Medical Record [id number] Number: Patient Account Number: [id number] [birth year] (69 y.o. Treating RN: Date of Birth/Sex: Female) Other Clinician: Primary Care Physician: Einar Crow Treating Evlyn Kanner Referring Physician: Einar Crow Physician/Extender: Tania Ade in Treatment: 2 Information Obtained from: Patient Chief Complaint Patient presents to the wound care center for a consult due non healing wound Pleasant 69 year old patient who comes with a history of having an injury to her right lower extremity approximately 2 weeks ago. She hit a briar bush and injured this area. Electronic Signature(s) Signed: 12/08/2014 12:26:09 PM By: Evlyn Kanner MD, FACS Entered By: Evlyn Kanner on 12/08/2014 12:20:54 Michelle Cabrera ([id number]) -------------------------------------------------------------------------------- Debridement Details Patient Name: Michelle, Cabrera 12/08/2014 11:45 Date of Service: AM Medical Record [id number] Number: Patient Account Number: [id number] 1946/02/03 (69 y.o. Treating RN: Date of Birth/Sex: Female) Other Clinician: Primary Care Physician: Einar Crow Treating Evlyn Kanner Referring Physician: Einar Crow Physician/Extender: Tania Ade in Treatment: 2 Debridement Performed for Wound #1 Right,Medial Lower Leg Assessment: Performed By: Physician Tristan Schroeder., MD Debridement: Debridement Pre-procedure Yes Verification/Time Out Taken: Start Time: 12:12 Pain Control: Lidocaine 4% Topical Solution Level: Skin/Subcutaneous Tissue Total Area Debrided (L x 1.9 (cm) x 1.8 (cm) = 3.42 (cm) W): Tissue and other Viable, Non-Viable, Exudate, Fibrin/Slough, Subcutaneous material debrided: Instrument: Curette Bleeding: Minimum Hemostasis Achieved: Pressure End  Time: 12:15 Procedural Pain: 0 Post Procedural Pain: 0 Response to Treatment: Procedure was tolerated well Post Debridement Measurements of Total Wound Length: (cm) 1.9 Width: (cm) 1.8 Depth: (cm) 0.2 Volume: (cm) 0.537 Electronic Signature(s) Signed: 12/08/2014 12:26:09 PM By: Evlyn Kanner MD, FACS Entered By: Evlyn Kanner on 12/08/2014 12:20:45 Michelle Cabrera ([id number]) -------------------------------------------------------------------------------- HPI Details Patient Name: Michelle Cabrera 12/08/2014 11:45 Date of Service: AM Medical Record [id number] Number: Patient Account Number: [id number] 03/22/1946 (69 y.o. Treating RN: Date of Birth/Sex: Female) Other Clinician: Primary Care Physician: Einar Crow Treating Evlyn Kanner Referring Physician: Einar Crow Physician/Extender: Tania Ade in Treatment: 2 History of Present Illness Location: right lower extremity Quality: Patient reports experiencing a dull pain to affected area(s). Severity: Patient states wound (s) are getting better. Duration: Patient has had the wound for < 2 weeks prior to presenting for treatment Timing: Pain in wound is Intermittent (comes and goes Context: The wound occurred when the patient was injured by a [employer] Modifying Factors: Other treatment(s) tried include: local care and doxycycline orally. Associated Signs and Symptoms: Patient reports having difficulty standing for long periods. HPI Description: once she injured her leg she has been applying Neosporin and covering it with a bandage. She has been on doxycycline and has taken a course of his treatment and will finish her tablets today. Her comorbidities include osteoporosis, coronary syndrome, neuralgia, spinal stenosis, hypertension, rheumatoid arthritis, GERD, COPD. Reviewed her list of her medications and note that she is on regular prednisone on a daily basis. She is also a smoker and smokes a pack of  cigarettes daily and has been for about 56 years. She does not have diabetes. She has been treated with doxycycline for the last 2 weeks. 11/25/2014 -- the edema and a right lower extremity had gone up significantly and her primary care doctor has increased her diuretic. she continues to smoke about 12-15 cigarettes a day and is going to start on nicotine patches tomorrow. She has completed her course of antibiotics. 12/02/2014 -- she says she's been taking  her diuretics regularly and is swelling of the leg has gone down remarkably. Uses the nicotine patches now and her smoking has gone down to a few cigarettes a day. Does complain of pain in her legs and calf and she thinks this may have something to do with her constant back problems and several back surgeries. 12/08/2014 -- the pain in the right lower extremity continued to be there and she saw her PCP who got a DVT study done. This report dated 12/06/2014 and there was no evidence of DVT in the right lower extremity. She continues to smoke however and has been not able to give it up completely yet. Electronic Signature(s) Signed: 12/08/2014 12:26:09 PM By: Evlyn Kanner MD, FACS Entered By: Evlyn Kanner on 12/08/2014 12:21:40 Michelle Cabrera ([id number] Cabrera ([id number]) -------------------------------------------------------------------------------- Physical Exam Details Patient Name: Michelle, Cabrera 12/08/2014 11:45 Date of Service: AM Medical Record [id number] Number: Patient Account Number: [id number] 09-Dec-1945 (69 y.o. Treating RN: Date of Birth/Sex: Female) Other Clinician: Primary Care Physician: Einar Crow Treating Evlyn Kanner Referring Physician: Einar Crow Physician/Extender: Tania Ade in Treatment: 2 Constitutional . Pulse regular. Respirations normal and unlabored. Afebrile. . Eyes Nonicteric. Reactive to light. Ears, Nose, Mouth, and Throat Lips, teeth, and gums WNL.Marland Kitchen Moist  mucosa without lesions . Neck supple and nontender. No palpable supraclavicular or cervical adenopathy. Normal sized without goiter. Respiratory WNL. No retractions.. Cardiovascular Pedal Pulses WNL. No clubbing, cyanosis or edema. Integumentary (Hair, Skin) the ulcerated area on her right lower extremity is much cleaner than before and has good granulation tissue but there are areas interspersed with significant amount of fibrin and slough in the subcutaneous tissue.Marland Kitchen No crepitus or fluctuance. No peri-wound warmth or erythema. No masses.Marland Kitchen Psychiatric Judgement and insight Intact.. No evidence of depression, anxiety, or agitation.. Electronic Signature(s) Signed: 12/08/2014 12:26:09 PM By: Evlyn Kanner MD, FACS Entered By: Evlyn Kanner on 12/08/2014 12:22:29 Michelle Cabrera ([id number]) -------------------------------------------------------------------------------- Physician Orders Details Patient Name: SHAHIRA, FISKE 12/08/2014 11:45 Date of Service: AM Medical Record [id number] Number: Patient Account Number: [id number] 03-12-1946 (69 y.o. Treating RN: Curtis Sites Date of Birth/Sex: Female) Other Clinician: Primary Care Physician: Einar Crow Treating Evlyn Kanner Referring Physician: Einar Crow Physician/Extender: Tania Ade in Treatment: 2 Verbal / Phone Orders: Yes Clinician: Curtis Sites Read Back and Verified: Yes Diagnosis Coding Wound Cleansing Wound #1 Right,Medial Lower Leg o Clean wound with Normal Saline. Anesthetic Wound #1 Right,Medial Lower Leg o Topical Lidocaine 4% cream applied to wound bed prior to debridement Skin Barriers/Peri-Wound Care Wound #1 Right,Medial Lower Leg o Skin Prep Primary Wound Dressing Wound #1 Right,Medial Lower Leg o Hydrafera Blue Secondary Dressing Wound #1 Right,Medial Lower Leg o Dry Gauze - secure with paper tape Dressing Change Frequency Wound #1 Right,Medial Lower Leg o Change  dressing every other day. Follow-up Appointments Wound #1 Right,Medial Lower Leg o Return Appointment in 1 week. Edema Control Wound #1 Right,Medial Lower Leg o Elevate legs to the level of the heart and pump ankles as often as possible o Tubigrip - double layer of Tubigrip LEGACIE, [place]. ([id number]) Additional Orders / Instructions Wound #1 Right,Medial Lower Leg o Stop Smoking Electronic Signature(s) Signed: 12/08/2014 12:26:09 PM By: Evlyn Kanner MD, FACS Signed: 12/08/2014 5:34:11 PM By: Curtis Sites Entered By: Curtis Sites on 12/08/2014 12:15:55 Michelle Cabrera ([id number]) -------------------------------------------------------------------------------- Problem List Details Patient Name: ARVADA, SEABORN 12/08/2014 11:45 Date of Service: AM Medical Record [id number] Number: Patient Account Number: [id number] [birth year] (69 y.o. 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 Treating RN: Date of  Birth/Sex: Female) Other Clinician: Primary Care Physician: Einar Crow Treating Evlyn Kanner Referring Physician: Einar Crow Physician/Extender: Tania Ade in Treatment: 2 Subjective Chief Complaint Information obtained from Patient Patient presents to the wound care center for a consult due non healing wound Pleasant 69 year old patient who comes with a history of having an injury to her right lower extremity approximately 2 weeks ago. She hit a briar bush and injured this area. History of Present Illness (HPI) The following HPI elements were documented for the patient's wound: Location: right lower extremity Quality: Patient reports experiencing a dull pain to affected area(s). Severity: Patient states wound (s) are getting better. Duration: Patient has had the wound for < 2 weeks prior to presenting for treatment Timing: Pain in wound is Intermittent (comes and goes Context: The wound occurred when the patient was injured by a [employer] Modifying Factors: Other treatment(s) tried include: local care and doxycycline orally. Associated Signs and Symptoms: Patient reports having difficulty standing for long periods. once she injured her leg she has been applying Neosporin and covering it with a bandage. She has been on doxycycline and has taken a course of his treatment and will finish her tablets today. Her comorbidities include osteoporosis, coronary syndrome, neuralgia, spinal stenosis, hypertension, rheumatoid arthritis, GERD, COPD. Reviewed her list of her medications and note that she is on regular prednisone on a daily basis. She is also a smoker and smokes a pack of cigarettes daily and has been for about 56 years. She does not have diabetes. She has been treated with doxycycline for the last 2 weeks. 11/25/2014 -- the edema and a right lower extremity had gone up significantly and her primary care doctor has increased her diuretic. she continues to smoke about  12-15 cigarettes a day and is going to start on nicotine patches tomorrow. She has completed her course of antibiotics. 12/02/2014 -- she says she's been taking her diuretics regularly and is swelling of the leg has gone down remarkably. Uses the nicotine patches now and her smoking has gone down to a few cigarettes a day. Does complain of pain in her legs and calf and she thinks this may have something to do with her constant back problems and several back surgeries. 12/08/2014 -- the pain in the right lower extremity continued to be there and she saw her PCP who got a Clubb, Kayleanna H. ([id number]) DVT study done. This report dated 12/06/2014 and there was no evidence of DVT in the right lower extremity. She continues to smoke however and has been not able to give it up completely yet. Objective Constitutional Pulse regular. Respirations normal and unlabored. Afebrile. Vitals Time Taken: 12:00 PM, Height: 62 in, Weight: 151 lbs, BMI: 27.6, Temperature: 98.3 F, Pulse: 90 bpm, Respiratory Rate: 18 breaths/min, Blood Pressure: 159/91  mmHg. Eyes Nonicteric. Reactive to light. Ears, Nose, Mouth, and Throat Lips, teeth, and gums WNL.Marland Kitchen Moist mucosa without lesions . Neck supple and nontender. No palpable supraclavicular or cervical adenopathy. Normal sized without goiter. Respiratory WNL. No retractions.. Cardiovascular Pedal Pulses WNL. No clubbing, cyanosis or edema. Psychiatric Judgement and insight Intact.. No evidence of depression, anxiety, or agitation.. Integumentary (Hair, Skin) the ulcerated area on her right lower extremity is much cleaner than before and has good granulation tissue but there are areas interspersed with significant amount of fibrin and slough in the subcutaneous tissue.Marland Kitchen No crepitus or fluctuance. No peri-wound warmth or erythema. No masses.. Wound #1 status is Open. Original cause of wound was Trauma. The wound is located on the Right,Medial Lower Leg. The  wound measures 1.9cm length x 1.8cm width x 0.2cm depth; 2.686cm^2 area and 0.537cm^3 volume. The wound is limited to skin breakdown. There is no tunneling or undermining noted. There is a small amount of serous drainage noted. The wound margin is flat and intact. There is medium (34-66%) pink granulation within the wound bed. There is a small (1-33%) amount of necrotic tissue within the wound bed including Adherent Slough. The periwound skin appearance exhibited: Localized Edema. The periwound skin appearance did not exhibit: Callus, Crepitus, Excoriation, Fluctuance, MAIDIE, GERK. ([id number]) Induration, Rash, Scarring, Dry/Scaly, Maceration, Moist, Atrophie Blanche, Cyanosis, Ecchymosis, Hemosiderin Staining, Mottled, Pallor, Rubor, Erythema. Periwound temperature was noted as No Abnormality. The periwound has tenderness on palpation. 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 I have spent some time again discussing with her the need for giving up smoking and have counseled her for all the benefits this will bring. she will come back and see me next week. Procedures Wound #1 Wound #1 is a Trauma, Other located on the Right,Medial Lower Leg . There was a Skin/Subcutaneous Tissue Debridement ([id number]) debridement with total area of 3.42 sq cm performed by Tristan Schroeder., MD. with the following instrument(s): Curette to remove Viable and Non-Viable tissue/material including Exudate, Fibrin/Slough, and Subcutaneous after achieving pain control using Lidocaine 4% Topical Solution. A time out was conducted prior to the start of the procedure. A  Minimum amount of bleeding was controlled with Pressure. The procedure was tolerated well with a pain level of 0 throughout and a pain level of 0 following the procedure. Post Debridement Measurements: 1.9cm length x 1.8cm width x 0.2cm depth; 0.537cm^3 volume. Plan Wound Cleansing: NANAYAA, LEDET ([id number]) Wound #1 Right,Medial Lower Leg: Clean wound with Normal Saline. Anesthetic: Wound #1 Right,Medial Lower Leg: Topical Lidocaine 4% cream applied to wound bed prior to debridement Skin Barriers/Peri-Wound Care: Wound #1 Right,Medial Lower Leg: Skin Prep Primary Wound Dressing: Wound #1 Right,Medial Lower Leg: Hydrafera Blue Secondary Dressing: Wound #1 Right,Medial Lower Leg: Dry Gauze - secure with paper tape Dressing Change Frequency: Wound #1 Right,Medial Lower Leg: Change dressing every other day. Follow-up Appointments: Wound #1 Right,Medial Lower Leg: Return Appointment in 1 week. Edema Control: Wound #1 Right,Medial Lower Leg: Elevate legs to the level of the heart and pump ankles as often as possible Tubigrip - double layer of Tubigrip Additional Orders / Instructions: Wound #1 Right,Medial Lower Leg: Stop Smoking The patient will continue to use  Hydrofera Blue over her wound and we will get this changed every other day. I have spent some time again discussing with her the need for giving up smoking and have counseled her for all the benefits this will bring. she will come back and see me next week. Electronic Signature(s) Signed: 12/08/2014 12:26:09 PM By: Evlyn Kanner MD, FACS Entered By: Evlyn Kanner on 12/08/2014 12:23:37 Michelle Cabrera ([id number]) -------------------------------------------------------------------------------- SuperBill Details Patient Name: Michelle Cabrera Date of Service: 12/08/2014 Medical Record Number: [id number] Patient Account Number: [id number] Date of Birth/Sex: 1946/04/16 (69 y.o. Female) Treating RN: Primary Care  Physician: Einar Crow Other Clinician: Referring Physician: Einar Crow Treating Physician/Extender: Rudene Re in Treatment: 2 Diagnosis Coding ICD-10 Codes Code Description [id number] Laceration without foreign body, right lower leg, initial encounter L97.212 Non-pressure chronic ulcer of right calf with fat layer exposed F17.218 Nicotine dependence, cigarettes, with other nicotine-induced disorders Facility Procedures CPT4 Code Description: [id number] 11042 - DEB SUBQ TISSUE 20 SQ CM/< ICD-10 Description Diagnosis S81.811A Laceration without foreign body, right lower leg, initi L97.212 Non-pressure chronic ulcer of right calf with fat layer F17.218 Nicotine dependence,  cigarettes, with other nicotine-in Modifier: al encounte exposed duced disor Quantity: 1 r ders Physician Procedures CPT4 Code Description: [id number] 11042 - WC PHYS SUBQ TISS 20 SQ CM ICD-10 Description Diagnosis S81.811A Laceration without foreign body, right lower leg, initi L97.212 Non-pressure chronic ulcer of right calf with fat layer F17.218 Nicotine dependence,  cigarettes, with other nicotine-in Modifier: al encounte exposed duced disor Quantity: 1 r ders Electronic Signature(s) Signed: 12/08/2014 12:26:09 PM By: Evlyn Kanner MD, FACS Entered By: Evlyn Kanner on 12/08/2014 12:23:51

## 2014-12-09 NOTE — Progress Notes (Signed)
AMELIYA, NICOTRA ([id number]) Visit Report for 12/08/2014 Arrival Information Details Patient Name: Michelle Cabrera, Michelle Cabrera. Date of Service: 12/08/2014 11:45 AM Medical Record Number: [id number] Patient Account Number: [id number] Date of Birth/Sex: Jul 09, 1946 (69 y.o. Female) Treating RN: Kristin Bruins Primary Care Physician: Einar Crow Other Clinician: Referring Physician: Einar Crow Treating Physician/Extender: Rudene Re in Treatment: 2 Visit Information History Since Last Visit Added or deleted any medications: No Patient Arrived: Ambulatory Any new allergies or adverse reactions: No Arrival Time: 11:58 Had a fall or experienced change in No Accompanied By: self activities of daily living that may affect Transfer Assistance: None risk of falls: Patient Has Alerts: Yes Signs or symptoms of abuse/neglect since last No visito Hospitalized since last visit: No Pain Present Now: Yes Notes pt started an antibiotic for URI-doesn't know the name Electronic Signature(s) Signed: 12/08/2014 4:22:36 PM By: Kristin Bruins Entered By: Kristin Bruins on 12/08/2014 12:00:43 [name] ([id number]) -------------------------------------------------------------------------------- Encounter Discharge Information Details Patient Name: [name] Date of Service: 12/08/2014 11:45 AM Medical Record Number: [id number] Patient Account Number: [id number] Date of Birth/Sex: [birth year] (69 y.o. Female) Treating RN: Kristin Bruins Primary Care Physician: Einar Crow Other Clinician: Referring Physician: Einar Crow Treating Physician/Extender: Rudene Re in Treatment: 2 Encounter Discharge Information Items Discharge Pain Level: 0 Discharge Condition: Stable Ambulatory Status: Ambulatory Discharge Destination: Home Transportation: Private Auto Accompanied By: sister Schedule Follow-up Appointment: Yes Medication Reconciliation  completed and provided to Patient/Care No Sherrin Stahle: Provided on Clinical Summary of Care: 12/08/2014 Form Type Recipient Paper Patient TO Electronic Signature(s) Signed: 12/08/2014 12:22:19 PM By: Gwenlyn Perking Entered By: Gwenlyn Perking on 12/08/2014 12:22:19 [name] ([id number]) -------------------------------------------------------------------------------- Lower Extremity Assessment Details Patient Name: [name] Date of Service: 12/08/2014 11:45 AM Medical Record Number: [id number] Patient Account Number: [id number] Date of Birth/Sex: 09-15-1945 (69 y.o. Female) Treating RN: Kristin Bruins Primary Care Physician: Einar Crow Other Clinician: Referring Physician: Einar Crow Treating Physician/Extender: Rudene Re in Treatment: 2 Edema Assessment Assessed: [Left: No] [Right: Yes] E[Left: dema] [Right: :] Calf Left: Right: Point of Measurement: 33 cm From Medial Instep cm 33 cm Ankle Left: Right: Point of Measurement: 10 cm From Medial Instep cm 18.5 cm Vascular Assessment Pulses: Posterior Tibial Palpable: [Right:Yes] Dorsalis Pedis Palpable: [Right:Yes] Extremity colors, hair growth, and conditions: Extremity Color: [Right:Normal] Hair Growth on Extremity: [Right:Yes] Temperature of Extremity: [Right:Warm] Capillary Refill: [Right:< 3 seconds] Toe Nail Assessment Left: Right: Thick: No Discolored: No Deformed: No Improper Length and Hygiene: No Electronic Signature(s) Signed: 12/08/2014 4:22:36 PM By: Kristin Bruins Entered By: Kristin Bruins on 12/08/2014 12:07:15 [name] ([name] ([id number]) -------------------------------------------------------------------------------- Multi Wound Chart Details Patient Name: [name] Date of Service: 12/08/2014 11:45 AM Medical Record Number: [id number] Patient Account Number: [id number] Date of Birth/Sex: 04/10/1946 (69 y.o.  Female) Treating RN: Curtis Sites Primary Care Physician: Einar Crow Other Clinician: Referring Physician: Einar Crow Treating Physician/Extender: Rudene Re in Treatment: 2 Vital Signs Height(in): 62 Pulse(bpm): 90 Weight(lbs): 151 Blood Pressure 159/91 (mmHg): Body Mass Index(BMI): 28 Temperature(F): 98.3 Respiratory Rate 18 (breaths/min): Photos: [1:No Photos] [N/A:N/A] Wound Location: [1:Right Lower Leg - Medial N/A] Wounding Event: [1:Trauma] [N/A:N/A] Primary Etiology: [1:Trauma, Other] [N/A:N/A] Comorbid History: [1:Chronic Obstructive Pulmonary Disease (COPD), Lupus Erythematosus, Rheumatoid Arthritis, Osteoarthritis, Neuropathy] [N/A:N/A] Date Acquired: [1:11/03/2014] [N/A:N/A] Weeks of Treatment: [1:2] [N/A:N/A] Wound Status: [1:Open] [N/A:N/A] Measurements L x W x D 1.9x1.8x0.2 [N/A:N/A] (cm) Area (cm) : [1:2.686] [N/A:N/A] Volume (cm) : [1:0.537] [N/A:N/A] % Reduction in Area: [  1:77.50%] [N/A:N/A] % Reduction in Volume: 55.00% [N/A:N/A] Classification: [1:Full Thickness Without Exposed Support Structures] [N/A:N/A] Exudate Amount: [1:Small] [N/A:N/A] Exudate Type: [1:Serous] [N/A:N/A] Exudate Color: [1:amber] [N/A:N/A] Wound Margin: [1:Flat and Intact] [N/A:N/A] Granulation Amount: [1:Medium (34-66%)] [N/A:N/A] Granulation Quality: [1:Pink] [N/A:N/A] Necrotic Amount: [1:Small (1-33%)] [N/A:N/A] Exposed Structures: [N/A:N/A] Fascia: No Fat: No Tendon: No Muscle: No Joint: No Bone: No Limited to Skin Breakdown Epithelialization: Small (1-33%) N/A N/A Periwound Skin Texture: Edema: Yes N/A N/A Excoriation: No Induration: No Callus: No Crepitus: No Fluctuance: No Friable: No Rash: No Scarring: No Periwound Skin Maceration: No N/A N/A Moisture: Moist: No Dry/Scaly: No Periwound Skin Color: Atrophie Blanche: No N/A N/A Cyanosis: No Ecchymosis: No Erythema: No Hemosiderin Staining: No Mottled: No Pallor:  No Rubor: No Temperature: No Abnormality N/A N/A Tenderness on Yes N/A N/A Palpation: Wound Preparation: Ulcer Cleansing: N/A N/A Rinsed/Irrigated with Saline Topical Anesthetic Applied: Other: lidocaine 4% Treatment Notes Electronic Signature(s) Signed: 12/08/2014 5:34:11 PM By: Curtis Sites Entered By: Curtis Sites on 12/08/2014 12:11:56 [name] ([id number]) -------------------------------------------------------------------------------- Multi-Disciplinary Care Plan Details Patient Name: [name] Date of Service: 12/08/2014 11:45 AM Medical Record Number: [id number] Patient Account Number: [id number] Date of Birth/Sex: May 08, 1946 (69 y.o. Female) Treating RN: Curtis Sites Primary Care Physician: Einar Crow Other Clinician: Referring Physician: Einar Crow Treating Physician/Extender: Rudene Re in Treatment: 2 Active Inactive Orientation to the Wound Care Program Nursing Diagnoses: Knowledge deficit related to the wound healing center program Goals: Patient/caregiver will verbalize understanding of the Wound Healing Center Program Date Initiated: 11/18/2014 Goal Status: Active Interventions: Provide education on orientation to the wound center Notes: Wound/Skin Impairment Nursing Diagnoses: Impaired tissue integrity Goals: Ulcer/skin breakdown will heal within 14 weeks Date Initiated: 11/18/2014 Goal Status: Active Interventions: Assess patient/caregiver ability to obtain necessary supplies Notes: Electronic Signature(s) Signed: 12/08/2014 5:34:11 PM By: Curtis Sites Entered By: Curtis Sites on 12/08/2014 12:11:38 [name] ([id number]) -------------------------------------------------------------------------------- Pain Assessment Details Patient Name: [name] Date of Service: 12/08/2014 11:45 AM Medical Record Number: [id number] Patient Account Number: [id number] Date of Birth/Sex: 01-30-1946  (69 y.o. Female) Treating RN: Kristin Bruins Primary Care Physician: Einar Crow Other Clinician: Referring Physician: Einar Crow Treating Physician/Extender: Rudene Re in Treatment: 2 Active Problems Location of Pain Severity and Description of Pain Patient Has Paino Yes Site Locations Pain Location: Pain in Ulcers With Dressing Change: Yes Duration of the Pain. Constant / Intermittento Constant Rate the pain. Current Pain Level: 8 Worst Pain Level: 8 Least Pain Level: 8 Tolerable Pain Level: 5 Character of Pain Describe the Pain: Aching, Burning Pain Management and Medication Current Pain Management: Medication: Yes Cold Application: No Rest: No Massage: No Activity: No T.E.N.S.: No Heat Application: No Leg drop or elevation: No Is the Current Pain Management Inadequate Adequate: How does your pain impact your activities of daily livingo Sleep: No Bathing: No Appetite: No Relationship With Others: No Bladder Continence: No Emotions: No Bowel Continence: No Work: No Toileting: No Drive: No Dressing: No Hobbies: No Notes KATISHA, SHIMIZU ([id number]) Takes oxycontin for pain. Electronic Signature(s) Signed: 12/08/2014 4:22:36 PM By: Kristin Bruins Entered By: Kristin Bruins on 12/08/2014 12:01:35 [name] ([id number]) -------------------------------------------------------------------------------- Patient/Caregiver Education Details Patient Name: [name] Date of Service: 12/08/2014 11:45 AM Medical Record Number: [id number] Patient Account Number: [id number] Date of Birth/Gender: 23-Jul-1946 (69 y.o. Female) Treating RN: Curtis Sites Primary Care Physician: Einar Crow Other Clinician: Referring Physician: Einar Crow Treating Physician/Extender: Rudene Re in Treatment: 2 Education Assessment Education  Provided To: Patient Education Topics Provided Smoking and Wound  Healing: Handouts: Smoking and Wound Healing Methods: Explain/Verbal Responses: State content correctly Wound/Skin Impairment: Handouts: Caring for Your Ulcer Methods: Demonstration, Explain/Verbal Responses: State content correctly Electronic Signature(s) Signed: 12/08/2014 4:22:36 PM By: Kristin Bruins Entered By: Kristin Bruins on 12/08/2014 12:20:21 [name] ([id number]) -------------------------------------------------------------------------------- Wound Assessment Details Patient Name: [name] Date of Service: 12/08/2014 11:45 AM Medical Record Number: [id number] Patient Account Number: [id number] Date of Birth/Sex: [birth year] (69 y.o. Female) Treating RN: Kristin Bruins Primary Care Physician: Einar Crow Other Clinician: Referring Physician: Einar Crow Treating Physician/Extender: Rudene Re in Treatment: 2 Wound Status Wound Number: 1 Primary Trauma, Other Etiology: Wound Location: Right Lower Leg - Medial Wound Open Wounding Event: Trauma Status: Date Acquired: 11/03/2014 Comorbid Chronic Obstructive Pulmonary Disease Weeks Of Treatment: 2 History: (COPD), Lupus Erythematosus, Clustered Wound: No Rheumatoid Arthritis, Osteoarthritis, Neuropathy Wound Measurements Length: (cm) 1.9 Width: (cm) 1.8 Depth: (cm) 0.2 Area: (cm) 2.686 Volume: (cm) 0.537 % Reduction in Area: 77.5% % Reduction in Volume: 55% Epithelialization: Small (1-33%) Tunneling: No Undermining: No Wound Description Full Thickness Without Exposed Classification: Support Structures Wound Margin: Flat and Intact Exudate Small Amount: Exudate Type: Serous Exudate Color: amber Foul Odor After Cleansing: No Wound Bed Granulation Amount: Medium (34-66%) Exposed Structure Granulation Quality: Pink Fascia Exposed: No Necrotic Amount: Small (1-33%) Fat Layer Exposed: No Necrotic Quality: Adherent Slough Tendon Exposed: No Muscle Exposed:  No Joint Exposed: No Bone Exposed: No Limited to Skin Breakdown Periwound Skin Texture Texture Color No Abnormalities Noted: No No Abnormalities Noted: No [place], [employer]. ([id number]) Callus: No Atrophie Blanche: No Crepitus: No Cyanosis: No Excoriation: No Ecchymosis: No Fluctuance: No Erythema: No Friable: No Hemosiderin Staining: No Induration: No Mottled: No Localized Edema: Yes Pallor: No Rash: No Rubor: No Scarring: No Temperature / Pain Moisture Temperature: No Abnormality No Abnormalities Noted: No Tenderness on Palpation: Yes Dry / Scaly: No Maceration: No Moist: No Wound Preparation Ulcer Cleansing: Rinsed/Irrigated with Saline Topical Anesthetic Applied: Other: lidocaine 4%, Treatment Notes Wound #1 (Right, Medial Lower Leg) 1. Cleansed with: Clean wound with Normal Saline 2. Anesthetic Topical Lidocaine 4% cream to wound bed prior to debridement 4. Dressing Applied: Hydrafera Blue 7. Secured with Paper tape Notes double layer of tubigrip D Electronic Signature(s) Signed: 12/08/2014 4:22:36 PM By: Kristin Bruins Entered By: Kristin Bruins on 12/08/2014 12:08:17 [name] ([id number]) -------------------------------------------------------------------------------- Vitals Details Patient Name: [name] Date of Service: 12/08/2014 11:45 AM Medical Record Number: [id number] Patient Account Number: [id number] Date of Birth/Sex: [birth year] (69 y.o. Female) Treating RN: Kristin Bruins Primary Care Physician: Einar Crow Other Clinician: Referring Physician: Einar Crow Treating Physician/Extender: Rudene Re in Treatment: 2 Vital Signs Time Taken: 12:00 Temperature (F): 98.3 Height (in): 62 Pulse (bpm): 90 Weight (lbs): 151 Respiratory Rate (breaths/min): 18 Body Mass Index (BMI): 27.6 Blood Pressure (mmHg): 159/91 Reference Range: 80 - 120 mg / dl Electronic Signature(s) Signed: 12/08/2014  4:22:36 PM By: Kristin Bruins Entered By: Kristin Bruins on 12/08/2014 12:02:00

## 2014-12-16 ENCOUNTER — Ambulatory Visit: Payer: BLUE CROSS/BLUE SHIELD | Admitting: Surgery

## 2014-12-22 ENCOUNTER — Ambulatory Visit: Payer: BLUE CROSS/BLUE SHIELD | Admitting: Surgery

## 2014-12-23 ENCOUNTER — Encounter: Payer: Medicare Other | Admitting: Surgery

## 2014-12-23 DIAGNOSIS — L97212 Non-pressure chronic ulcer of right calf with fat layer exposed: Secondary | ICD-10-CM | POA: Diagnosis not present

## 2014-12-23 NOTE — Progress Notes (Signed)
Michelle Cabrera ([id number]) Visit Report for 12/23/2014 Arrival Information Details Patient Name: Michelle Cabrera, Michelle Cabrera. Date of Service: 12/23/2014 11:00 AM Medical Record Number: [id number] Patient Account Number: [id number] Date of Birth/Sex: February 06, 1946 (69 y.o. Female) Treating RN: Huel Coventry Primary Care Physician: Einar Crow Other Clinician: Referring Physician: Einar Crow Treating Physician/Extender: Rudene Re in Treatment: 5 Visit Information History Since Last Visit Added or deleted any medications: No Patient Arrived: Ambulatory Any new allergies or adverse reactions: No Arrival Time: 11:00 Had a fall or experienced change in No Accompanied By: self activities of daily living that may affect Transfer Assistance: None risk of falls: Patient Identification Verified: Yes Signs or symptoms of abuse/neglect since last No Secondary Verification Process Yes visito Completed: Hospitalized since last visit: No Patient Has Alerts: Yes Has Dressing in Place as Prescribed: Yes Pain Present Now: No Electronic Signature(s) Signed: 12/23/2014 2:10:21 PM By: Elliot Gurney, RN, BSN, Kim RN, BSN Entered By: Elliot Gurney, RN, BSN, Kim on 12/23/2014 11:00:29 Michelle Cabrera ([id number]) -------------------------------------------------------------------------------- Encounter Discharge Information Details Patient Name: Michelle Cabrera Date of Service: 12/23/2014 11:00 AM Medical Record Number: [id number] Patient Account Number: [id number] Date of Birth/Sex: 09-Apr-1946 (69 y.o. Female) Treating RN: Huel Coventry Primary Care Physician: Einar Crow Other Clinician: Referring Physician: Einar Crow Treating Physician/Extender: BURNS, Regis Bill in Treatment: 5 Encounter Discharge Information Items Discharge Pain Level: 0 Discharge Condition: Stable Ambulatory Status: Ambulatory Discharge Destination: Home Transportation: Private Auto Accompanied By:  self Schedule Follow-up Appointment: Yes Medication Reconciliation completed and provided to Patient/Care Yes Velvia Mehrer: Provided on Clinical Summary of Care: 12/23/2014 Form Type Recipient Paper Patient TO Electronic Signature(s) Signed: 12/23/2014 11:33:55 AM By: Gwenlyn Perking Entered By: Gwenlyn Perking on 12/23/2014 11:33:55 Michelle Cabrera ([id number]) -------------------------------------------------------------------------------- Lower Extremity Assessment Details Patient Name: Michelle Cabrera Date of Service: 12/23/2014 11:00 AM Medical Record Number: [id number] Patient Account Number: [id number] Date of Birth/Sex: 1946-01-14 (69 y.o. Female) Treating RN: Huel Coventry Primary Care Physician: Einar Crow Other Clinician: Referring Physician: Einar Crow Treating Physician/Extender: BURNS, Regis Bill in Treatment: 5 Edema Assessment Assessed: [Left: No] [Right: No] E[Left: dema] [Right: :] Calf Left: Right: Point of Measurement: 33 cm From Medial Instep cm 29.6 cm Ankle Left: Right: Point of Measurement: 10 cm From Medial Instep cm 19.2 cm Vascular Assessment Pulses: Posterior Tibial Dorsalis Pedis Palpable: [Right:Yes] Extremity colors, hair growth, and conditions: Extremity Color: [Right:Normal] Hair Growth on Extremity: [Right:Yes] Temperature of Extremity: [Right:Cool] Capillary Refill: [Right:< 3 seconds] Toe Nail Assessment Left: Right: Thick: No Discolored: No Deformed: No Improper Length and Hygiene: No Electronic Signature(s) Signed: 12/23/2014 2:10:21 PM By: Elliot Gurney, RN, BSN, Kim RN, BSN Entered By: Elliot Gurney, RN, BSN, Kim on 12/23/2014 11:04:43 Michelle Cabrera ([id number]) -------------------------------------------------------------------------------- Multi Wound Chart Details Patient Name: Michelle Cabrera Date of Service: 12/23/2014 11:00 AM Medical Record Number: [id number] Patient Account Number: [id number] Date of Birth/Sex:  May 08, 1946 (69 y.o. Female) Treating RN: Huel Coventry Primary Care Physician: Einar Crow Other Clinician: Referring Physician: Einar Crow Treating Physician/Extender: BURNS, Regis Bill in Treatment: 5 Vital Signs Height(in): 62 Pulse(bpm): 87 Weight(lbs): 151 Blood Pressure 133/57 (mmHg): Body Mass Index(BMI): 28 Temperature(F): 98.0 Respiratory Rate 20 (breaths/min): Photos: [1:No Photos] [N/A:N/A] Wound Location: [1:Right Lower Leg - Medial N/A] Wounding Event: [1:Trauma] [N/A:N/A] Primary Etiology: [1:Trauma, Other] [N/A:N/A] Comorbid History: [1:Chronic Obstructive Pulmonary Disease (COPD), Lupus Erythematosus, Rheumatoid Arthritis, Osteoarthritis, Neuropathy] [N/A:N/A] Date Acquired: [1:11/03/2014] [N/A:N/A] Weeks of Treatment: [1:5] [N/A:N/A] Wound Status: [1:Open] [N/A:N/A] Measurements L x W x D 1.4x2.5x0.2 [N/A:N/A] (cm) Area (cm) : [  1:2.749] [N/A:N/A] Volume (cm) : [1:0.55] [N/A:N/A] % Reduction in Area: [1:77.00%] [N/A:N/A] % Reduction in Volume: 53.90% [N/A:N/A] Classification: [1:Full Thickness Without Exposed Support Structures] [N/A:N/A] Exudate Amount: [1:Small] [N/A:N/A] Exudate Type: [1:Serous] [N/A:N/A] Exudate Color: [1:amber] [N/A:N/A] Wound Margin: [1:Flat and Intact] [N/A:N/A] Granulation Amount: [1:Medium (34-66%)] [N/A:N/A] Granulation Quality: [1:Pink] [N/A:N/A] Necrotic Amount: [1:Medium (34-66%)] [N/A:N/A] Exposed Structures: [N/A:N/A] Fascia: No Fat: No Tendon: No Muscle: No Joint: No Bone: No Limited to Skin Breakdown Epithelialization: Small (1-33%) N/A N/A Periwound Skin Texture: Edema: Yes N/A N/A Excoriation: No Induration: No Callus: No Crepitus: No Fluctuance: No Friable: No Rash: No Scarring: No Periwound Skin Moist: Yes N/A N/A Moisture: Maceration: No Dry/Scaly: No Periwound Skin Color: Atrophie Blanche: No N/A N/A Cyanosis: No Ecchymosis: No Erythema: No Hemosiderin Staining: No Mottled:  No Pallor: No Rubor: No Temperature: No Abnormality N/A N/A Tenderness on Yes N/A N/A Palpation: Wound Preparation: Ulcer Cleansing: N/A N/A Rinsed/Irrigated with Saline Topical Anesthetic Applied: Other: lidocaine 4% Treatment Notes Electronic Signature(s) Signed: 12/23/2014 2:10:21 PM By: Elliot Gurney, RN, BSN, Kim RN, BSN Entered By: Elliot Gurney, RN, BSN, Kim on 12/23/2014 11:09:09 Michelle Cabrera ([id number]) -------------------------------------------------------------------------------- Multi-Disciplinary Care Plan Details Patient Name: Michelle Cabrera Date of Service: 12/23/2014 11:00 AM Medical Record Number: [id number] Patient Account Number: [id number] Date of Birth/Sex: April 11, 1946 (69 y.o. Female) Treating RN: Huel Coventry Primary Care Physician: Einar Crow Other Clinician: Referring Physician: Einar Crow Treating Physician/Extender: BURNS, Regis Bill in Treatment: 5 Active Inactive Orientation to the Wound Care Program Nursing Diagnoses: Knowledge deficit related to the wound healing center program Goals: Patient/caregiver will verbalize understanding of the Wound Healing Center Program Date Initiated: 11/18/2014 Goal Status: Active Interventions: Provide education on orientation to the wound center Notes: Wound/Skin Impairment Nursing Diagnoses: Impaired tissue integrity Goals: Ulcer/skin breakdown will heal within 14 weeks Date Initiated: 11/18/2014 Goal Status: Active Interventions: Assess patient/caregiver ability to obtain necessary supplies Notes: Electronic Signature(s) Signed: 12/23/2014 2:10:21 PM By: Elliot Gurney, RN, BSN, Kim RN, BSN Entered By: Elliot Gurney, RN, BSN, Kim on 12/23/2014 11:09:02 Michelle Cabrera ([id number]) -------------------------------------------------------------------------------- Pain Assessment Details Patient Name: Michelle Cabrera Date of Service: 12/23/2014 11:00 AM Medical Record Number: [id number] Patient Account  Number: [id number] Date of Birth/Sex: February 12, 1946 (69 y.o. Female) Treating RN: Huel Coventry Primary Care Physician: Einar Crow Other Clinician: Referring Physician: Einar Crow Treating Physician/Extender: Rudene Re in Treatment: 5 Active Problems Location of Pain Severity and Description of Pain Patient Has Paino No Site Locations Pain Management and Medication Current Pain Management: Electronic Signature(s) Signed: 12/23/2014 2:10:21 PM By: Elliot Gurney, RN, BSN, Kim RN, BSN Entered By: Elliot Gurney, RN, BSN, Kim on 12/23/2014 11:00:37 Michelle Cabrera ([id number]) -------------------------------------------------------------------------------- Patient/Caregiver Education Details Patient Name: Michelle Cabrera Date of Service: 12/23/2014 11:00 AM Medical Record Number: [id number] Patient Account Number: [id number] Date of Birth/Gender: 03-21-1946 (69 y.o. Female) Treating RN: Huel Coventry Primary Care Physician: Einar Crow Other Clinician: Referring Physician: Einar Crow Treating Physician/Extender: BURNS, Regis Bill in Treatment: 5 Education Assessment Education Provided To: Patient Education Topics Provided Wound/Skin Impairment: Handouts: Caring for Your Ulcer, Skin Care Do's and Dont's Methods: Demonstration, Explain/Verbal Responses: State content correctly Electronic Signature(s) Signed: 12/23/2014 2:10:21 PM By: Elliot Gurney, RN, BSN, Kim RN, BSN Entered By: Elliot Gurney, RN, BSN, Kim on 12/23/2014 11:32:15 Michelle Cabrera ([id number]) -------------------------------------------------------------------------------- Wound Assessment Details Patient Name: Michelle Cabrera Date of Service: 12/23/2014 11:00 AM Medical Record Number: [id number] Patient Account Number: [id number] Date of Birth/Sex: [birth year] (69 y.o. Female) Treating RN: Huel Coventry Primary Care Physician: Einar Crow  Other Clinician: Referring Physician: Einar Crow Treating Physician/Extender: BURNS, Regis Bill in Treatment: 5 Wound Status Wound Number: 1 Primary Trauma, Other Etiology: Wound Location: Right Lower Leg - Medial Wound Open Wounding Event: Trauma Status: Date Acquired: 11/03/2014 Comorbid Chronic Obstructive Pulmonary Disease Weeks Of Treatment: 5 History: (COPD), Lupus Erythematosus, Clustered Wound: No Rheumatoid Arthritis, Osteoarthritis, Neuropathy Photos Photo Uploaded By: Curtis Sites on 12/23/2014 16:03:37 Wound Measurements Length: (cm) 1.4 Width: (cm) 2.5 Depth: (cm) 0.2 Area: (cm) 2.749 Volume: (cm) 0.55 % Reduction in Area: 77% % Reduction in Volume: 53.9% Epithelialization: Small (1-33%) Tunneling: No Undermining: No Wound Description Full Thickness Without Exposed Classification: Support Structures Wound Margin: Flat and Intact Exudate Small Amount: Exudate Type: Serous Exudate Color: amber Foul Odor After Cleansing: No Wound Bed Granulation Amount: Medium (34-66%) Exposed Structure ANALI, CABANILLA. ([id number]) Granulation Quality: Pink Fascia Exposed: No Necrotic Amount: Medium (34-66%) Fat Layer Exposed: No Necrotic Quality: Adherent Slough Tendon Exposed: No Muscle Exposed: No Joint Exposed: No Bone Exposed: No Limited to Skin Breakdown Periwound Skin Texture Texture Color No Abnormalities Noted: No No Abnormalities Noted: No Callus: No Atrophie Blanche: No Crepitus: No Cyanosis: No Excoriation: No Ecchymosis: No Fluctuance: No Erythema: No Friable: No Hemosiderin Staining: No Induration: No Mottled: No Localized Edema: Yes Pallor: No Rash: No Rubor: No Scarring: No Temperature / Pain Moisture Temperature: No Abnormality No Abnormalities Noted: No Tenderness on Palpation: Yes Dry / Scaly: No Maceration: No Moist: Yes Wound Preparation Ulcer Cleansing: Rinsed/Irrigated with Saline Topical Anesthetic Applied: Other: lidocaine 4%, Treatment  Notes Wound #1 (Right, Medial Lower Leg) 1. Cleansed with: Clean wound with Normal Saline 2. Anesthetic Topical Lidocaine 4% cream to wound bed prior to debridement 3. Peri-wound Care: Skin Prep 4. Dressing Applied: Hydrafera Blue 5. Secondary Dressing Applied Gauze and Kerlix/Conform 7. Secured with Paper tape Tubigrip Notes MONIFAH, FREEHLING ([id number]) double layer of tubigrip D Electronic Signature(s) Signed: 12/23/2014 2:10:21 PM By: Elliot Gurney, RN, BSN, Kim RN, BSN Entered By: Elliot Gurney, RN, BSN, Kim on 12/23/2014 11:06:56 Michelle Cabrera ([id number]) -------------------------------------------------------------------------------- Vitals Details Patient Name: Michelle Cabrera Date of Service: 12/23/2014 11:00 AM Medical Record Number: [id number] Patient Account Number: [id number] Date of Birth/Sex: [birth year] (69 y.o. Female) Treating RN: Huel Coventry Primary Care Physician: Einar Crow Other Clinician: Referring Physician: Einar Crow Treating Physician/Extender: Rudene Re in Treatment: 5 Vital Signs Time Taken: 11:02 Temperature (F): 98.0 Height (in): 62 Pulse (bpm): 87 Weight (lbs): 151 Respiratory Rate (breaths/min): 20 Body Mass Index (BMI): 27.6 Blood Pressure (mmHg): 133/57 Reference Range: 80 - 120 mg / dl Electronic Signature(s) Signed: 12/23/2014 2:10:21 PM By: Elliot Gurney, RN, BSN, Kim RN, BSN Entered By: Elliot Gurney, RN, BSN, Kim on 12/23/2014 11:04:00

## 2014-12-23 NOTE — Progress Notes (Signed)
LUAN, URBANI ([id number]) Visit Report for 12/23/2014 Chief Complaint Document Details Patient Name: Michelle Cabrera, Michelle Cabrera 12/23/2014 11:00 Date of Service: AM Medical Record [id number] Number: Patient Account Number: [id number] 1946-03-02 (69 y.o. Treating RN: Date of Birth/Sex: Female) Other Clinician: Primary Care Physician: Einar Crow Treating BURNS, Zollie Beckers Referring Physician: Einar Crow Physician/Extender: Tania Ade in Treatment: 5 Information Obtained from: Patient Chief Complaint Patient presents to the wound care center for a consult due non healing wound Pleasant 69 year old patient who comes with a history of having an injury to her right lower extremity approximately 2 weeks ago. She hit a briar bush and injured this area. Electronic Signature(s) Signed: 12/23/2014 2:14:37 PM By: Madelaine Bhat MD Entered By: Madelaine Bhat on 12/23/2014 12:07:56 Michelle Cabrera ([id number]) -------------------------------------------------------------------------------- Debridement Details Patient Name: TALAR, FRALEY 12/23/2014 11:00 Date of Service: AM Medical Record [id number] Number: Patient Account Number: [id number] [birth year] (69 y.o. Treating RN: Date of Birth/Sex: Female) Other Clinician: Primary Care Physician: Einar Crow Treating BURNS, WALTER Referring Physician: Einar Crow Physician/Extender: Tania Ade in Treatment: 5 Debridement Performed for Wound #1 Right,Medial Lower Leg Assessment: Performed By: Physician BURNS, Melanie Crazier., MD Debridement: Debridement Start Time: 11:12 Pain Control: Lidocaine 4% Topical Solution Level: Skin/Subcutaneous Tissue Total Area Debrided (L x 1.4 (cm) x 2.5 (cm) = 3.5 (cm) W): Tissue and other Viable, Non-Viable, Fat, Fibrin/Slough, Subcutaneous material debrided: Instrument: Curette Bleeding: Minimum Hemostasis Achieved: Pressure End Time: 11:18 Procedural Pain: 0 Post Procedural Pain:  0 Response to Treatment: Procedure was tolerated well Post Debridement Measurements of Total Wound Length: (cm) 1.4 Width: (cm) 2.5 Depth: (cm) 0.3 Volume: (cm) 0.825 Electronic Signature(s) Signed: 12/23/2014 2:14:37 PM By: Madelaine Bhat MD Entered By: Madelaine Bhat on 12/23/2014 12:07:48 Michelle Cabrera ([id number]) -------------------------------------------------------------------------------- HPI Details Patient Name: Michelle Cabrera 12/23/2014 11:00 Date of Service: AM Medical Record [id number] Number: Patient Account Number: [id number] 05-16-1946 (69 y.o. Treating RN: Date of Birth/Sex: Female) Other Clinician: Primary Care Physician: Einar Crow Treating BURNS, Zollie Beckers Referring Physician: Einar Crow Physician/Extender: Weeks in Treatment: 5 History of Present Illness Location: right lower extremity Quality: Patient reports experiencing a dull pain to affected area(s). Severity: Patient states wound (s) are getting better. Duration: Patient has had the wound for < 2 weeks prior to presenting for treatment Timing: Pain in wound is Intermittent (comes and goes Context: The wound occurred when the patient was injured by a [employer] Modifying Factors: Other treatment(s) tried include: local care and doxycycline orally. Associated Signs and Symptoms: Patient reports having difficulty standing for long periods. HPI Description: once she injured her leg she has been applying Neosporin and covering it with a bandage. She has been on doxycycline and has taken a course of his treatment and will finish her tablets today. Her comorbidities include osteoporosis, coronary syndrome, neuralgia, spinal stenosis, hypertension, rheumatoid arthritis, GERD, COPD. Reviewed her list of her medications and note that she is on regular prednisone on a daily basis. She is also a smoker and smokes a pack of cigarettes daily and has been for about 56 years. She does  not have diabetes. She has been treated with doxycycline for the last 2 weeks. 11/25/2014 -- the edema and a right lower extremity had gone up significantly and her primary care doctor has increased her diuretic. she continues to smoke about 12-15 cigarettes a day and is going to start on nicotine patches tomorrow. She has completed her course of antibiotics. 12/02/2014 -- she says she's been taking her diuretics regularly  and is swelling of the leg has gone down remarkably. Uses the nicotine patches now and her smoking has gone down to a few cigarettes a day. Does complain of pain in her legs and calf and she thinks this may have something to do with her constant back problems and several back surgeries. 12/08/2014 -- the pain in the right lower extremity continued to be there and she saw her PCP who got a DVT study done. This report dated 12/06/2014 and there was no evidence of DVT in the right lower extremity. She continues to smoke however and has been not able to give it up completely yet. 12/23/2014 - No new complaints today. Pain slightly improved. No fever or chills. Electronic Signature(s) Signed: 12/23/2014 2:14:37 PM By: Madelaine Bhat MD Michelle Cabrera ([id number]) Entered By: Madelaine Bhat on 12/23/2014 12:08:37 Michelle Cabrera ([id number]) -------------------------------------------------------------------------------- Physical Exam Details Patient Name: Michelle Cabrera, Michelle Cabrera 12/23/2014 11:00 Date of Service: AM Medical Record [id number] Number: Patient Account Number: [id number] May 17, 1946 (69 y.o. Treating RN: Date of Birth/Sex: Female) Other Clinician: Primary Care Physician: Einar Crow Treating BURNS, Zollie Beckers Referring Physician: Einar Crow Physician/Extender: Weeks in Treatment: 5 Constitutional . Pulse regular. Respirations normal and unlabored. Afebrile. Marland Kitchen Respiratory WNL. No retractions.. Cardiovascular Pedal Pulses  WNL. Integumentary (Hair, Skin) .Marland Kitchen Neurological Sensation normal to touch, pin,and vibration. Psychiatric Judgement and insight Intact.. Oriented times 3.. No evidence of depression, anxiety, or agitation.. Notes Right calf ulcer. Full thickness. Debrided to healthy underlying, bleeding subcutaneous fat and granulation tissue. No exposed deep structures. No cellulitis. Minimal localized edema. Palpable DP. Electronic Signature(s) Signed: 12/23/2014 2:14:37 PM By: Madelaine Bhat MD Entered By: Madelaine Bhat on 12/23/2014 12:09:23 Michelle Cabrera ([id number]) -------------------------------------------------------------------------------- Physician Orders Details Patient Name: Michelle Cabrera, Michelle Cabrera 12/23/2014 11:00 Date of Service: AM Medical Record [id number] Number: Patient Account Number: [id number] 05/27/1946 (69 y.o. Treating RN: Huel Coventry Date of Birth/Sex: Female) Other Clinician: Primary Care Physician: Einar Crow Treating BURNS, Zollie Beckers Referring Physician: Einar Crow Physician/Extender: Tania Ade in Treatment: 5 Verbal / Phone Orders: Yes Clinician: Huel Coventry Read Back and Verified: Yes Diagnosis Coding Wound Cleansing Wound #1 Right,Medial Lower Leg o Clean wound with Normal Saline. Anesthetic Wound #1 Right,Medial Lower Leg o Topical Lidocaine 4% cream applied to wound bed prior to debridement Skin Barriers/Peri-Wound Care Wound #1 Right,Medial Lower Leg o Skin Prep Primary Wound Dressing Wound #1 Right,Medial Lower Leg o Hydrafera Blue Secondary Dressing Wound #1 Right,Medial Lower Leg o Dry Gauze - secure with paper tape Dressing Change Frequency Wound #1 Right,Medial Lower Leg o Change dressing every other day. Follow-up Appointments Wound #1 Right,Medial Lower Leg o Return Appointment in 1 week. Edema Control Wound #1 Right,Medial Lower Leg o Elevate legs to the level of the heart and pump ankles as often as  possible o Tubigrip - double layer of Tubigrip SAYLOR, MURRY ([id number]) Additional Orders / Instructions Wound #1 Right,Medial Lower Leg o Stop Smoking Electronic Signature(s) Signed: 12/23/2014 2:10:21 PM By: Elliot Gurney RN, BSN, Kim RN, BSN Signed: 12/23/2014 2:14:37 PM By: Madelaine Bhat MD Entered By: Elliot Gurney, RN, BSN, Kim on 12/23/2014 11:19:17 Michelle Cabrera ([id number]) -------------------------------------------------------------------------------- Problem List Details Patient Name: Michelle Cabrera, Michelle Cabrera 12/23/2014 11:00 Date of Service: AM Medical Record [id number] Number: Patient Account Number: [id number] 16-Jun-1946 (69 y.o. 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 Treating RN: Date of Birth/Sex: Female) Other Clinician: Primary Care Physician: Einar Crow Treating BURNS, Zollie Beckers Referring Physician: Einar Crow Physician/Extender: Tania Ade in Treatment: 5 Subjective Chief  Complaint Information obtained from Patient Patient presents to the wound care center for a consult due non healing wound Pleasant 69 year old patient who comes with a history of having an injury to her right lower extremity approximately 2 weeks ago. She hit a briar bush and injured this area. History of Present Illness (HPI) The following HPI elements were documented for the patient's wound: Location: right lower extremity Quality: Patient reports experiencing a dull pain to affected area(s). Severity: Patient states wound (s) are getting better. Duration: Patient has had the wound for < 2 weeks prior to presenting for treatment Timing: Pain in wound is Intermittent (comes and goes Context: The wound occurred when the patient was injured by a [employer] Modifying Factors: Other treatment(s) tried include: local care and doxycycline orally. Associated Signs and Symptoms: Patient reports having difficulty standing for long periods. once she injured her leg she has been applying Neosporin and covering it with a bandage. She has been on doxycycline and has taken a course of his treatment and will finish her tablets today. Her comorbidities include osteoporosis, coronary syndrome, neuralgia, spinal stenosis, hypertension, rheumatoid arthritis, GERD, COPD. Reviewed her list of her medications and note that she is on regular prednisone on a daily basis. She is also a smoker and smokes a pack of cigarettes daily and has been for about 56 years. She does not have diabetes. She has been treated with doxycycline for the last 2 weeks. 11/25/2014 -- the edema and a right lower extremity had gone up significantly and her primary care doctor has increased her diuretic. she continues to smoke about 12-15 cigarettes a day and is going to start on nicotine patches tomorrow. She has completed her course of antibiotics. 12/02/2014 -- she says she's been taking her diuretics regularly and is swelling of the  leg has gone down remarkably. Uses the nicotine patches now and her smoking has gone down to a few cigarettes a day. Does complain of pain in her legs and calf and she thinks this may have something to do with her constant back problems and several back surgeries. 12/08/2014 -- the pain in the right lower extremity continued to be there and she saw her PCP who got a Michelle Cabrera, Michelle H. ([id number]) DVT study done. This report dated 12/06/2014 and there was no evidence of DVT in the right lower extremity. She continues to smoke however and has been not able to give it up completely yet. 12/23/2014 - No new complaints today. Pain slightly improved. No fever or chills. Objective Constitutional Pulse regular. Respirations normal and unlabored. Afebrile. Vitals Time Taken: 11:02 AM, Height: 62 in, Weight: 151 lbs, BMI: 27.6, Temperature: 98.0 F, Pulse: 87 bpm, Respiratory Rate: 20 breaths/min, Blood Pressure: 133/57 mmHg. Respiratory WNL. No retractions.. Cardiovascular Pedal Pulses WNL. Neurological Sensation normal to touch,  pin,and vibration. Psychiatric Judgement and insight Intact.. Oriented times 3.. No evidence of depression, anxiety, or agitation.. General Notes: Right calf ulcer. Full thickness. Debrided to healthy underlying, bleeding subcutaneous fat and granulation tissue. No exposed deep structures. No cellulitis. Minimal localized edema. Palpable DP. Integumentary (Hair, Skin) Wound #1 status is Open. Original cause of wound was Trauma. The wound is located on the Right,Medial Lower Leg. The wound measures 1.4cm length x 2.5cm width x 0.2cm depth; 2.749cm^2 area and 0.55cm^3 volume. The wound is limited to skin breakdown. There is no tunneling or undermining noted. There is a small amount of serous drainage noted. The wound margin is flat and intact. There is medium (34-66%) pink granulation within the wound bed. There is a medium (34-66%) amount of necrotic tissue within the  wound bed including Adherent Slough. The periwound skin appearance exhibited: Localized Edema, Moist. The periwound skin appearance did not exhibit: Callus, Crepitus, Excoriation, Fluctuance, Friable, Induration, Rash, Scarring, Dry/Scaly, Maceration, Atrophie Blanche, Cyanosis, Ecchymosis, Hemosiderin Staining, Mottled, Pallor, Rubor, Erythema. Periwound temperature was noted as No Abnormality. The periwound has tenderness on palpation. 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 Procedures Wound #1 Wound #1 is a Trauma, Other located on the Right,Medial Lower Leg . There was a Skin/Subcutaneous Tissue Debridement ([id number]) debridement with total area of 3.5 sq cm performed by BURNS, Melanie Crazier., MD. with the following instrument(s): Curette to remove Viable and Non-Viable tissue/material including Fat, Fibrin/Slough, and Subcutaneous after achieving pain control using Lidocaine 4% Topical Solution. A Minimum amount of bleeding was controlled with Pressure. The procedure was tolerated well with a pain level of 0 throughout and a pain level of 0 following the procedure. Post Debridement Measurements: 1.4cm length x 2.5cm width x 0.3cm depth; 0.825cm^3 volume. Plan Wound Cleansing: Wound #1 Right,Medial Lower Leg: Clean wound with Normal Saline. Anesthetic: Wound #1 Right,Medial Lower Leg: Topical Lidocaine 4% cream applied to wound bed prior to debridement Skin Barriers/Peri-Wound Care: Wound #1 Right,Medial Lower Leg: Skin Prep Primary Wound Dressing: Wound #1 Right,Medial Lower Leg: Hydrafera Blue Secondary Dressing: Michelle Cabrera, Michelle Cabrera ([id number]) Wound #1 Right,Medial Lower Leg: Dry Gauze -  secure with paper tape Dressing Change Frequency: Wound #1 Right,Medial Lower Leg: Change dressing every other day. Follow-up Appointments: Wound #1 Right,Medial Lower Leg: Return Appointment in 1 week. Edema Control: Wound #1 Right,Medial Lower Leg: Elevate legs to the level of the heart and pump ankles as often as possible Tubigrip - double layer of Tubigrip Additional Orders / Instructions: Wound #1 Right,Medial Lower Leg: Stop Smoking Continue with Hydrofera Blue dressing changes. Tubigrip for edema control. Smoking cessation recommendations given. Electronic Signature(s) Signed: 12/23/2014 2:14:37 PM By: Madelaine Bhat MD Entered By: Madelaine Bhat on 12/23/2014 12:10:11 Michelle Cabrera ([id number]) -------------------------------------------------------------------------------- SuperBill Details Patient Name: Michelle Cabrera Date of Service: 12/23/2014 Medical Record Number: [id number] Patient Account Number: [id number] Date of Birth/Sex: 06-24-1946 (69 y.o. Female) Treating RN: Primary Care Physician: Einar Crow Other Clinician: Referring Physician: Einar Crow Treating Physician/Extender: BURNS, Regis Bill in Treatment: 5 Diagnosis Coding ICD-10 Codes Code Description [phone] Laceration without foreign body, right lower leg, initial encounter L97.212 Non-pressure chronic ulcer of right calf with fat layer exposed F17.218 Nicotine dependence, cigarettes, with other nicotine-induced disorders Facility Procedures CPT4 Code Description: [id number] - DEB  SUBQ TISSUE 20 SQ CM/< ICD-10 Description Diagnosis S81.811A Laceration without foreign body, right lower leg, init Modifier: ial encounte Quantity: 1 r Physician Procedures CPT4 Code Description: [id number] WC PHYS LEVEL 3 o NEW PT ICD-10 Description Diagnosis S81.811A Laceration without foreign body, right lower leg, init Modifier: ial encounte Quantity: 1 r CPT4 Code Description: [id number]  11042 - WC PHYS SUBQ TISS 20 SQ CM ICD-10 Description Diagnosis S81.811A Laceration without foreign body, right lower leg, init Modifier: ial encounte Quantity: 1 r Electronic Signature(s) Signed: 12/23/2014 2:14:37 PM By: Madelaine Bhat MD Entered By: Madelaine Bhat on 12/23/2014 12:10:32

## 2014-12-30 ENCOUNTER — Ambulatory Visit: Payer: BLUE CROSS/BLUE SHIELD | Admitting: Surgery

## 2015-01-02 ENCOUNTER — Encounter: Payer: Medicare Other | Attending: Surgery | Admitting: Surgery

## 2015-01-02 DIAGNOSIS — L97212 Non-pressure chronic ulcer of right calf with fat layer exposed: Secondary | ICD-10-CM | POA: Insufficient documentation

## 2015-01-02 DIAGNOSIS — S81811A Laceration without foreign body, right lower leg, initial encounter: Secondary | ICD-10-CM | POA: Insufficient documentation

## 2015-01-02 DIAGNOSIS — J449 Chronic obstructive pulmonary disease, unspecified: Secondary | ICD-10-CM | POA: Insufficient documentation

## 2015-01-02 DIAGNOSIS — M069 Rheumatoid arthritis, unspecified: Secondary | ICD-10-CM | POA: Diagnosis not present

## 2015-01-02 DIAGNOSIS — F17210 Nicotine dependence, cigarettes, uncomplicated: Secondary | ICD-10-CM | POA: Insufficient documentation

## 2015-01-02 DIAGNOSIS — X58XXXA Exposure to other specified factors, initial encounter: Secondary | ICD-10-CM | POA: Diagnosis not present

## 2015-01-03 NOTE — Progress Notes (Signed)
Michelle Cabrera, Michelle Cabrera ([id number]) Visit Report for 01/02/2015 Chief Complaint Document Details Patient Name: Michelle Cabrera. Date of Service: 01/02/2015 1:45 PM Medical Record Number: [id number] Patient Account Number: [id number] Date of Birth/Sex: [birth year] (69 y.o. Female) Treating RN: Primary Care Physician: Einar Crow Other Clinician: Referring Physician: Einar Crow Treating Physician/Extender: Rudene Re in Treatment: 6 Information Obtained from: Patient Chief Complaint Patient presents to the wound care center for a consult due non healing wound Pleasant 69 year old patient who comes with a history of having an injury to her right lower extremity approximately 2 weeks ago. She hit a briar bush and injured this area. Electronic Signature(s) Signed: 01/02/2015 4:42:08 PM By: Evlyn Kanner MD, FACS Entered By: Evlyn Kanner on 01/02/2015 14:06:12 Michelle Cabrera ([id number]) -------------------------------------------------------------------------------- Debridement Details Patient Name: Michelle Cabrera Date of Service: 01/02/2015 1:45 PM Medical Record Number: [id number] Patient Account Number: [id number] Date of Birth/Sex: Sep 16, 1945 (69 y.o. Female) Treating RN: Primary Care Physician: Einar Crow Other Clinician: Referring Physician: Einar Crow Treating Physician/Extender: Rudene Re in Treatment: 6 Debridement Performed for Wound #1 Right,Medial Lower Leg Assessment: Performed By: Physician Tristan Schroeder., MD Debridement: Debridement Pre-procedure Yes Verification/Time Out Taken: Start Time: 13:56 Pain Control: Lidocaine 4% Topical Solution Level: Skin/Subcutaneous Tissue Total Area Debrided (L x 1.2 (cm) x 1.6 (cm) = 1.92 (cm) W): Tissue and other Exudate, Fibrin/Slough, Subcutaneous material debrided: Instrument: Forceps Bleeding: Minimum Hemostasis Achieved: Pressure End Time: 13:58 Procedural Pain: 0 Post  Procedural Pain: 0 Response to Treatment: Procedure was tolerated well Post Debridement Measurements of Total Wound Length: (cm) 1.2 Width: (cm) 1.6 Depth: (cm) 0.2 Volume: (cm) 0.302 Electronic Signature(s) Signed: 01/02/2015 4:42:08 PM By: Evlyn Kanner MD, FACS Entered By: Evlyn Kanner on 01/02/2015 14:06:05 Michelle Cabrera ([id number]) -------------------------------------------------------------------------------- HPI Details Patient Name: Michelle Cabrera Date of Service: 01/02/2015 1:45 PM Medical Record Number: [id number] Patient Account Number: [id number] Date of Birth/Sex: [birth year] (69 y.o. Female) Treating RN: Primary Care Physician: Einar Crow Other Clinician: Referring Physician: Einar Crow Treating Physician/Extender: Rudene Re in Treatment: 6 History of Present Illness Location: right lower extremity Quality: Patient reports experiencing a dull pain to affected area(s). Severity: Patient states wound (s) are getting better. Duration: Patient has had the wound for < 2 weeks prior to presenting for treatment Timing: Pain in wound is Intermittent (comes and goes Context: The wound occurred when the patient was injured by a [employer] Modifying Factors: Other treatment(s) tried include: local care and doxycycline orally. Associated Signs and Symptoms: Patient reports having difficulty standing for long periods. HPI Description: once she injured her leg she has been applying Neosporin and covering it with a bandage. She has been on doxycycline and has taken a course of his treatment and will finish her tablets today. Her comorbidities include osteoporosis, coronary syndrome, neuralgia, spinal stenosis, hypertension, rheumatoid arthritis, GERD, COPD. Reviewed her list of her medications and note that she is on regular prednisone on a daily basis. She is also a smoker and smokes a pack of cigarettes daily and has been for about 56  years. She does not have diabetes. She has been treated with doxycycline for the last 2 weeks. 11/25/2014 -- the edema and a right lower extremity had gone up significantly and her primary care doctor has increased her diuretic. she continues to smoke about 12-15 cigarettes a day and is going to start on nicotine patches tomorrow. She has completed her course of antibiotics. 12/02/2014 -- she says she's been taking her diuretics  regularly and is swelling of the leg has gone down remarkably. Uses the nicotine patches now and her smoking has gone down to a few cigarettes a day. Does complain of pain in her legs and calf and she thinks this may have something to do with her constant back problems and several back surgeries. 12/08/2014 -- the pain in the right lower extremity continued to be there and she saw her PCP who got a DVT study done. This report dated 12/06/2014 and there was no evidence of DVT in the right lower extremity. She continues to smoke however and has been not able to give it up completely yet. 12/23/2014 - No new complaints today. Pain slightly improved. No fever or chills. 01/02/2015 -- continues to do well and has been having a few cigarettes a day. Using nicotine gum and other methods to quit smoking. Electronic Signature(s) Michelle Cabrera, Michelle Cabrera ([id number]) Signed: 01/02/2015 4:42:08 PM By: Evlyn Kanner MD, FACS Entered By: Evlyn Kanner on 01/02/2015 14:06:42 Michelle Cabrera ([id number]) -------------------------------------------------------------------------------- Physical Exam Details Patient Name: Michelle Cabrera Date of Service: 01/02/2015 1:45 PM Medical Record Number: [id number] Patient Account Number: [id number] Date of Birth/Sex: February 04, 1946 (69 y.o. Female) Treating RN: Primary Care Physician: Einar Crow Other Clinician: Referring Physician: Einar Crow Treating Physician/Extender: Rudene Re in Treatment: 6 Constitutional . Pulse  regular. Respirations normal and unlabored. Afebrile. . Eyes Nonicteric. Reactive to light. Ears, Nose, Mouth, and Throat Lips, teeth, and gums WNL.Marland Kitchen Moist mucosa without lesions . Neck supple and nontender. No palpable supraclavicular or cervical adenopathy. Normal sized without goiter. Respiratory WNL. No retractions.. Cardiovascular Pedal Pulses WNL. No clubbing, cyanosis or edema. Integumentary (Hair, Skin) the wound looks clean and has minimal slough which will be sharply debrided.. No crepitus or fluctuance. No peri-wound warmth or erythema. No masses.Marland Kitchen Psychiatric Judgement and insight Intact.. No evidence of depression, anxiety, or agitation.. Electronic Signature(s) Signed: 01/02/2015 4:42:08 PM By: Evlyn Kanner MD, FACS Entered By: Evlyn Kanner on 01/02/2015 14:07:39 Michelle Cabrera ([id number]) -------------------------------------------------------------------------------- Physician Orders Details Patient Name: Michelle Cabrera Date of Service: 01/02/2015 1:45 PM Medical Record Number: [id number] Patient Account Number: [id number] Date of Birth/Sex: 1945-08-08 (69 y.o. Female) Treating RN: Curtis Sites Primary Care Physician: Einar Crow Other Clinician: Referring Physician: Einar Crow Treating Physician/Extender: Rudene Re in Treatment: 6 Verbal / Phone Orders: Yes Clinician: Curtis Sites Read Back and Verified: Yes Diagnosis Coding Wound Cleansing Wound #1 Right,Medial Lower Leg o Clean wound with Normal Saline. Anesthetic Wound #1 Right,Medial Lower Leg o Topical Lidocaine 4% cream applied to wound bed prior to debridement Skin Barriers/Peri-Wound Care Wound #1 Right,Medial Lower Leg o Skin Prep Primary Wound Dressing Wound #1 Right,Medial Lower Leg o Hydrafera Blue Secondary Dressing Wound #1 Right,Medial Lower Leg o Dry Gauze - secure with paper tape Dressing Change Frequency Wound #1 Right,Medial Lower  Leg o Change dressing every other day. Follow-up Appointments Wound #1 Right,Medial Lower Leg o Return Appointment in 1 week. Edema Control Wound #1 Right,Medial Lower Leg o Elevate legs to the level of the heart and pump ankles as often as possible o Tubigrip - double layer of Tubigrip Additional Orders / Instructions Michelle Cabrera, Michelle Cabrera ([id number]) Wound #1 Right,Medial Lower Leg o Stop Smoking Electronic Signature(s) Signed: 01/02/2015 4:42:08 PM By: Evlyn Kanner MD, FACS Signed: 01/02/2015 5:11:42 PM By: Curtis Sites Entered By: Curtis Sites on 01/02/2015 14:00:28 Michelle Cabrera ([id number]) -------------------------------------------------------------------------------- Problem List Details Patient Name: Michelle Cabrera Date of Service: 01/02/2015 1:45 PM Medical Record  Number: [id number] Patient Account Number: [id number] Date of Birth/Sex: 1945/11/03 (69 y.o. 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  Female) Treating RN: Primary Care Physician: Einar Crow Other Clinician: Referring Physician: Einar Crow Treating Physician/Extender: Rudene Re in Treatment: 6 Subjective Chief Complaint Information obtained from Patient Patient presents to the wound care center for a consult due non healing wound Pleasant 69 year old patient who comes with a history of having an injury to her right lower extremity approximately 2 weeks ago. She hit a briar bush and injured this area. History of Present Illness (HPI) The following HPI elements were documented for the patient's wound: Location: right lower extremity Quality: Patient reports experiencing a dull pain to affected area(s). Severity: Patient states wound (s) are getting better. Duration: Patient has had the wound for < 2 weeks prior to presenting for treatment Timing: Pain in wound is Intermittent (comes and goes Context: The wound occurred when the patient was injured by a [employer] Modifying Factors: Other treatment(s) tried include: local care and doxycycline orally. Associated Signs and Symptoms: Patient reports having difficulty standing for long periods. once she injured her leg she has been applying Neosporin and covering it with a bandage. She has been on doxycycline and has taken a course of his treatment and will finish her tablets today. Her comorbidities include osteoporosis, coronary syndrome, neuralgia, spinal stenosis, hypertension, rheumatoid arthritis, GERD, COPD. Reviewed her list of her medications and note that she is on regular prednisone on a daily basis. She is also a smoker and smokes a pack of cigarettes daily and has been for about 56 years. She does not have diabetes. She has been treated with doxycycline for the last 2 weeks. 11/25/2014 -- the edema and a right lower extremity had gone up significantly and her primary care doctor has increased her diuretic. she continues to smoke about  12-15 cigarettes a day and is going to start on nicotine patches tomorrow. She has completed her course of antibiotics. 12/02/2014 -- she says she's been taking her diuretics regularly and is swelling of the leg has gone down remarkably. Uses the nicotine patches now and her smoking has gone down to a few cigarettes a day. Does complain of pain in her legs and calf and she thinks this may have something to do with her constant back problems and several back surgeries. 12/08/2014 -- the pain in the right lower extremity continued to be there and she saw her PCP who got a DVT study done. This report dated 12/06/2014 and there was no evidence of DVT in the right lower extremity. Michelle Cabrera, Michelle Cabrera ([id number]) She continues to smoke however and has been not able to give it up completely yet. 12/23/2014 - No new complaints today. Pain slightly improved. No fever or chills. 01/02/2015 -- continues to do well and has  been having a few cigarettes a day. Using nicotine gum and other methods to quit smoking. Objective Constitutional Pulse regular. Respirations normal and unlabored. Afebrile. Vitals Time Taken: 1:45 PM, Height: 62 in, Weight: 151 lbs, BMI: 27.6, Temperature: 98.6 F, Pulse: 88 bpm, Respiratory Rate: 24 breaths/min, Blood Pressure: 160/72 mmHg. Eyes Nonicteric. Reactive to light. Ears, Nose, Mouth, and Throat Lips, teeth, and gums WNL.Marland Kitchen Moist mucosa without lesions . Neck supple and nontender. No palpable supraclavicular or cervical adenopathy. Normal sized without goiter. Respiratory WNL. No retractions.. Cardiovascular Pedal Pulses WNL. No clubbing, cyanosis or edema. Psychiatric Judgement and insight Intact.. No evidence of depression, anxiety, or agitation.. Integumentary (Hair, Skin) the wound looks clean and has minimal slough which will be sharply debrided.. No crepitus or fluctuance. No peri-wound warmth or erythema. No masses.. Wound #1 status is Open. Original cause  of wound was Trauma. The wound is located on the Right,Medial Lower Leg. The wound measures 1.2cm length x 1.6cm width x 0.1cm depth; 1.508cm^2 area and 0.151cm^3 volume. The wound is limited to skin breakdown. There is no tunneling or undermining noted. There is a small amount of serous drainage noted. The wound margin is flat and intact. There is medium (34-66%) pink granulation within the wound bed. There is a medium (34-66%) amount of necrotic tissue Michelle Cabrera, Michelle H. ([id number]) within the wound bed including Adherent Slough. The periwound skin appearance exhibited: Localized Edema, Moist. The periwound skin appearance did not exhibit: Callus, Crepitus, Excoriation, Fluctuance, Friable, Induration, Rash, Scarring, Dry/Scaly, Maceration, Atrophie Blanche, Cyanosis, Ecchymosis, Hemosiderin Staining, Mottled, Pallor, Rubor, Erythema. Periwound temperature was noted as No Abnormality. The periwound has tenderness on palpation. 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 Motivated her to give up smoking completely. She'll come back and see as next week. Procedures Wound #1 Wound #1 is a Trauma, Other located on the Right,Medial Lower Leg . There was a Skin/Subcutaneous Tissue Debridement ([id number]) debridement with total area of 1.92 sq cm performed by Maisee Vollman, Ignacia Felling., MD. with the following instrument(s): Forceps including Exudate, Fibrin/Slough, and Subcutaneous after achieving pain control using Lidocaine 4% Topical Solution. A time out was conducted prior to the start of the procedure. A Minimum amount of bleeding was controlled with Pressure. The procedure was tolerated well with  a pain level of 0 throughout and a pain level of 0 following the procedure. Post Debridement Measurements: 1.2cm length x 1.6cm width x 0.2cm depth; 0.302cm^3 volume. Plan Wound Cleansing: Wound #1 Right,Medial Lower Leg: Michelle Cabrera, Michelle Cabrera. ([id number]) Clean wound with Normal Saline. Anesthetic: Wound #1 Right,Medial Lower Leg: Topical Lidocaine 4% cream applied to wound bed prior to debridement Skin Barriers/Peri-Wound Care: Wound #1 Right,Medial Lower Leg: Skin Prep Primary Wound Dressing: Wound #1 Right,Medial Lower Leg: Hydrafera Blue Secondary Dressing: Wound #1 Right,Medial Lower Leg: Dry Gauze - secure with paper tape Dressing Change Frequency: Wound #1 Right,Medial Lower Leg: Change dressing every other day. Follow-up Appointments: Wound #1 Right,Medial Lower Leg: Return Appointment in 1 week. Edema Control: Wound #1 Right,Medial Lower Leg: Elevate legs to the level of the heart and pump ankles as often as possible Tubigrip - double layer of Tubigrip Additional Orders / Instructions: Wound #1 Right,Medial Lower Leg: Stop Smoking We'll continue using Hydrofera Blue and apply a bordered  foam dressing over this. Motivated her to give up smoking completely. She'll come back and see as next week. Electronic Signature(s) Signed: 01/02/2015 4:42:08 PM By: Evlyn Kanner MD, FACS Entered By: Evlyn Kanner on 01/02/2015 14:08:35 Michelle Cabrera ([id number]) -------------------------------------------------------------------------------- SuperBill Details Patient Name: Michelle Cabrera Date of Service: 01/02/2015 Medical Record Number: [id number] Patient Account Number: [id number] Date of Birth/Sex: 08/08/1945 (69 y.o. Female) Treating RN: Primary Care Physician: Einar Crow Other Clinician: Referring Physician: Einar Crow Treating Physician/Extender: Rudene Re in Treatment: 6 Diagnosis Coding ICD-10 Codes Code Description [phone] Laceration  without foreign body, right lower leg, initial encounter L97.212 Non-pressure chronic ulcer of right calf with fat layer exposed F17.218 Nicotine dependence, cigarettes, with other nicotine-induced disorders Facility Procedures CPT4 Code Description: [id number] 11042 - DEB SUBQ TISSUE 20 SQ CM/< ICD-10 Description Diagnosis S81.811A Laceration without foreign body, right lower leg, init L97.212 Non-pressure chronic ulcer of right calf with fat laye Modifier: ial encounte r exposed Quantity: 1 r Physician Procedures CPT4 Code Description: [id number] 11042 - WC PHYS SUBQ TISS 20 SQ CM ICD-10 Description Diagnosis S81.811A Laceration without foreign body, right lower leg, init L97.212 Non-pressure chronic ulcer of right calf with fat laye Modifier: ial encounte r exposed Quantity: 1 r Electronic Signature(s) Signed: 01/02/2015 4:42:08 PM By: Evlyn Kanner MD, FACS Entered By: Evlyn Kanner on 01/02/2015 14:08:48

## 2015-01-03 NOTE — Progress Notes (Signed)
VI, CLABORN ([id number]) Visit Report for 01/02/2015 Arrival Information Details Patient Name: Michelle Cabrera, Michelle Cabrera. Date of Service: 01/02/2015 1:45 PM Medical Record Number: [id number] Patient Account Number: [id number] Date of Birth/Sex: 10-23-1945 (69 y.o. Female) Treating RN: Renee Harder Primary Care Physician: Einar Crow Other Clinician: Referring Physician: Einar Crow Treating Physician/Extender: Rudene Re in Treatment: 6 Visit Information History Since Last Visit Added or deleted any medications: No Patient Arrived: Ambulatory Any new allergies or adverse reactions: No Arrival Time: 13:40 Had a fall or experienced change in No Accompanied By: self activities of daily living that may affect Transfer Assistance: None risk of falls: Patient Identification Verified: Yes Signs or symptoms of abuse/neglect since last No Secondary Verification Process Yes visito Completed: Hospitalized since last visit: No Patient Has Alerts: Yes Has Dressing in Place as Prescribed: Yes Pain Present Now: No Electronic Signature(s) Signed: 01/02/2015 4:49:26 PM By: Renee Harder RN Entered By: Renee Harder on 01/02/2015 13:45:41 Michelle Cabrera ([id number]) -------------------------------------------------------------------------------- Encounter Discharge Information Details Patient Name: Michelle Cabrera Date of Service: 01/02/2015 1:45 PM Medical Record Number: [id number] Patient Account Number: [id number] Date of Birth/Sex: 11/26/1945 (69 y.o. Female) Treating RN: Primary Care Physician: Einar Crow Other Clinician: Referring Physician: Einar Crow Treating Physician/Extender: Rudene Re in Treatment: 6 Encounter Discharge Information Items Schedule Follow-up Appointment: No Medication Reconciliation completed No and provided to Patient/Care [name]: Provided on Clinical Summary of Care: 01/02/2015 Form Type Recipient Paper  Patient TO Electronic Signature(s) Signed: 01/02/2015 2:03:59 PM By: Francie Massing Entered By: Francie Massing on 01/02/2015 14:03:59 Michelle Cabrera ([id number]) -------------------------------------------------------------------------------- General Visit Notes Details Patient Name: Michelle Cabrera Date of Service: 01/02/2015 1:45 PM Medical Record Number: [id number] Patient Account Number: [id number] Date of Birth/Sex: [birth year] (69 y.o. Female) Treating RN: Renee Harder Primary Care Physician: Einar Crow Other Clinician: Referring Physician: Einar Crow Treating Physician/Extender: Rudene Re in Treatment: 6 Notes Pt had two new wounds, one to the right posterior forearm (trauma) and another to the right proximal lower leg (trauma). Pt states she does not want these wounds treated at the wound center at this time. Pt is encouraged to let us know if she would like [place] to treat these areas in the future. Electronic Signature(s) Signed: 01/02/2015 4:49:26 PM By: Renee Harder RN Entered By: Renee Harder on 01/02/2015 16:47:42 Michelle Cabrera ([id number]) -------------------------------------------------------------------------------- Lower Extremity Assessment Details Patient Name: Michelle Cabrera Date of Service: 01/02/2015 1:45 PM Medical Record Number: [id number] Patient Account Number: [id number] Date of Birth/Sex: 1946/03/13 (69 y.o. Female) Treating RN: Renee Harder Primary Care Physician: Einar Crow Other Clinician: Referring Physician: Einar Crow Treating Physician/Extender: Rudene Re in Treatment: 6 Edema Assessment Assessed: [Left: No] [Right: Yes] Edema: [Left: Ye] [Right: s] Calf Left: Right: Point of Measurement: 33 cm From Medial Instep cm 31.5 cm Ankle Left: Right: Point of Measurement: 10 cm From Medial Instep cm 18.1 cm Vascular Assessment Claudication: Claudication Assessment [Right:None] Pulses: Posterior  Tibial Palpable: [Right:Yes] Dorsalis Pedis Palpable: [Right:Yes] Extremity colors, hair growth, and conditions: Extremity Color: [Right:Normal] Hair Growth on Extremity: [Right:No] Temperature of Extremity: [Right:Warm] Capillary Refill: [Right:< 3 seconds] Dependent Rubor: [Right:No] Blanched when Elevated: [Right:No] Toe Nail Assessment Left: Right: Thick: No Discolored: No Deformed: No Improper Length and Hygiene: No Michelle Cabrera ([id number]) Electronic Signature(s) Signed: 01/02/2015 4:49:26 PM By: Renee Harder RN Entered By: Renee Harder on 01/02/2015 13:49:02 Michelle Cabrera ([id number]) -------------------------------------------------------------------------------- Multi Wound Chart Details Patient Name: Michelle Cabrera Date of Service: 01/02/2015 1:45 PM  Medical Record Number: [id number] Patient Account Number: [id number] Date of Birth/Sex: 1946/02/17 (69 y.o. Female) Treating RN: Curtis Sites Primary Care Physician: Einar Crow Other Clinician: Referring Physician: Einar Crow Treating Physician/Extender: Rudene Re in Treatment: 6 Vital Signs Height(in): 62 Pulse(bpm): 88 Weight(lbs): 151 Blood Pressure 160/72 (mmHg): Body Mass Index(BMI): 28 Temperature(F): 98.6 Respiratory Rate 24 (breaths/min): Photos: [1:No Photos] [N/A:N/A] Wound Location: [1:Right Lower Leg - Medial N/A] Wounding Event: [1:Trauma] [N/A:N/A] Primary Etiology: [1:Trauma, Other] [N/A:N/A] Comorbid History: [1:Chronic Obstructive Pulmonary Disease (COPD), Lupus Erythematosus, Rheumatoid Arthritis, Osteoarthritis, Neuropathy] [N/A:N/A] Date Acquired: [1:11/03/2014] [N/A:N/A] Weeks of Treatment: [1:6] [N/A:N/A] Wound Status: [1:Open] [N/A:N/A] Measurements L x W x D 1.2x1.6x0.1 [N/A:N/A] (cm) Area (cm) : [1:1.508] [N/A:N/A] Volume (cm) : [1:0.151] [N/A:N/A] % Reduction in Area: [1:87.40%] [N/A:N/A] % Reduction in Volume: 87.40%  [N/A:N/A] Classification: [1:Full Thickness Without Exposed Support Structures] [N/A:N/A] Exudate Amount: [1:Small] [N/A:N/A] Exudate Type: [1:Serous] [N/A:N/A] Exudate Color: [1:amber] [N/A:N/A] Wound Margin: [1:Flat and Intact] [N/A:N/A] Granulation Amount: [1:Medium (34-66%)] [N/A:N/A] Granulation Quality: [1:Pink] [N/A:N/A] Necrotic Amount: [1:Medium (34-66%)] [N/A:N/A] Exposed Structures: [N/A:N/A] Fascia: No Fat: No Tendon: No Muscle: No Joint: No Bone: No Limited to Skin Breakdown Epithelialization: None N/A N/A Periwound Skin Texture: Edema: Yes N/A N/A Excoriation: No Induration: No Callus: No Crepitus: No Fluctuance: No Friable: No Rash: No Scarring: No Periwound Skin Moist: Yes N/A N/A Moisture: Maceration: No Dry/Scaly: No Periwound Skin Color: Atrophie Blanche: No N/A N/A Cyanosis: No Ecchymosis: No Erythema: No Hemosiderin Staining: No Mottled: No Pallor: No Rubor: No Temperature: No Abnormality N/A N/A Tenderness on Yes N/A N/A Palpation: Wound Preparation: Ulcer Cleansing: N/A N/A Rinsed/Irrigated with Saline Topical Anesthetic Applied: Other: lidocaine 4% Treatment Notes Electronic Signature(s) Signed: 01/02/2015 5:11:42 PM By: Curtis Sites Entered By: Curtis Sites on 01/02/2015 13:57:12 Michelle Cabrera ([id number]) -------------------------------------------------------------------------------- Multi-Disciplinary Care Plan Details Patient Name: Michelle Cabrera Date of Service: 01/02/2015 1:45 PM Medical Record Number: [id number] Patient Account Number: [id number] Date of Birth/Sex: 12-Nov-1945 (69 y.o. Female) Treating RN: Curtis Sites Primary Care Physician: Einar Crow Other Clinician: Referring Physician: Einar Crow Treating Physician/Extender: Rudene Re in Treatment: 6 Active Inactive Orientation to the Wound Care Program Nursing Diagnoses: Knowledge deficit related to the wound healing center  program Goals: Patient/caregiver will verbalize understanding of the Wound Healing Center Program Date Initiated: 11/18/2014 Goal Status: Active Interventions: Provide education on orientation to the wound center Notes: Wound/Skin Impairment Nursing Diagnoses: Impaired tissue integrity Goals: Ulcer/skin breakdown will heal within 14 weeks Date Initiated: 11/18/2014 Goal Status: Active Interventions: Assess patient/caregiver ability to obtain necessary supplies Notes: Electronic Signature(s) Signed: 01/02/2015 5:11:42 PM By: Curtis Sites Entered By: Curtis Sites on 01/02/2015 13:57:02 Michelle Cabrera ([id number]) -------------------------------------------------------------------------------- Pain Assessment Details Patient Name: Michelle Cabrera Date of Service: 01/02/2015 1:45 PM Medical Record Number: [id number] Patient Account Number: [id number] Date of Birth/Sex: 19-Jan-1946 (69 y.o. Female) Treating RN: Renee Harder Primary Care Physician: Einar Crow Other Clinician: Referring Physician: Einar Crow Treating Physician/Extender: Rudene Re in Treatment: 6 Active Problems Location of Pain Severity and Description of Pain Patient Has Paino No Site Locations Pain Management and Medication Current Pain Management: Electronic Signature(s) Signed: 01/02/2015 4:49:26 PM By: Renee Harder RN Entered By: Renee Harder on 01/02/2015 13:45:47 Michelle Cabrera ([id number]) -------------------------------------------------------------------------------- Patient/Caregiver Education Details Patient Name: Michelle Cabrera Date of Service: 01/02/2015 1:45 PM Medical Record Number: [id number] Patient Account Number: [id number] Date of Birth/Gender: [birth year] (69 y.o. Female) Treating RN: Curtis Sites Primary Care Physician: Einar Crow Other Clinician: Referring Physician: Dareen Piano,  Gaynell Face Treating Physician/Extender: Rudene Re in  Treatment: 6 Education Assessment Education Provided To: Patient Education Topics Provided Smoking and Wound Healing: Handouts: Other: continue with smoking cessation Methods: Explain/Verbal Responses: State content correctly Electronic Signature(s) Signed: 01/02/2015 5:11:42 PM By: Curtis Sites Entered By: Curtis Sites on 01/02/2015 14:01:03 Michelle Cabrera ([id number]) -------------------------------------------------------------------------------- Wound Assessment Details Patient Name: Michelle Cabrera Date of Service: 01/02/2015 1:45 PM Medical Record Number: [id number] Patient Account Number: [id number] Date of Birth/Sex: [birth year] (69 y.o. Female) Treating RN: Renee Harder Primary Care Physician: Einar Crow Other Clinician: Referring Physician: Einar Crow Treating Physician/Extender: Rudene Re in Treatment: 6 Wound Status Wound Number: 1 Primary Trauma, Other Etiology: Wound Location: Right Lower Leg - Medial Wound Open Wounding Event: Trauma Status: Date Acquired: 11/03/2014 Comorbid Chronic Obstructive Pulmonary Disease Weeks Of Treatment: 6 History: (COPD), Lupus Erythematosus, Clustered Wound: No Rheumatoid Arthritis, Osteoarthritis, Neuropathy Photos Photo Uploaded By: Renee Harder on 01/02/2015 16:44:10 Wound Measurements Length: (cm) 1.2 Width: (cm) 1.6 Depth: (cm) 0.1 Area: (cm) 1.508 Volume: (cm) 0.151 % Reduction in Area: 87.4% % Reduction in Volume: 87.4% Epithelialization: None Tunneling: No Undermining: No Wound Description Full Thickness Without Exposed Classification: Support Structures Wound Margin: Flat and Intact CRISTIE, MCKINNEY. ([id number]) Foul Odor After Cleansing: No Exudate Small Amount: Exudate Type: Serous Exudate Color: amber Wound Bed Granulation Amount: Medium (34-66%) Exposed Structure Granulation Quality: Pink Fascia Exposed: No Necrotic Amount: Medium (34-66%) Fat Layer  Exposed: No Necrotic Quality: Adherent Slough Tendon Exposed: No Muscle Exposed: No Joint Exposed: No Bone Exposed: No Limited to Skin Breakdown Periwound Skin Texture Texture Color No Abnormalities Noted: No No Abnormalities Noted: No Callus: No Atrophie Blanche: No Crepitus: No Cyanosis: No Excoriation: No Ecchymosis: No Fluctuance: No Erythema: No Friable: No Hemosiderin Staining: No Induration: No Mottled: No Localized Edema: Yes Pallor: No Rash: No Rubor: No Scarring: No Temperature / Pain Moisture Temperature: No Abnormality No Abnormalities Noted: No Tenderness on Palpation: Yes Dry / Scaly: No Maceration: No Moist: Yes Wound Preparation Ulcer Cleansing: Rinsed/Irrigated with Saline Topical Anesthetic Applied: Other: lidocaine 4%, Treatment Notes Wound #1 (Right, Medial Lower Leg) 1. Cleansed with: Clean wound with Normal Saline 2. Anesthetic Topical Lidocaine 4% cream to wound bed prior to debridement 4. Dressing Applied: Hydrafera Blue 5. Secondary Dressing Applied AUGUSTINE, BRANNICK ([id number]) Gauze and Kerlix/Conform Electronic Signature(s) Signed: 01/02/2015 4:49:26 PM By: Renee Harder RN Entered By: Renee Harder on 01/02/2015 13:51:55 Michelle Cabrera ([id number]) -------------------------------------------------------------------------------- Vitals Details Patient Name: Michelle Cabrera Date of Service: 01/02/2015 1:45 PM Medical Record Number: [id number] Patient Account Number: [id number] Date of Birth/Sex: 1945-08-26 (69 y.o. Female) Treating RN: Renee Harder Primary Care Physician: Einar Crow Other Clinician: Referring Physician: Einar Crow Treating Physician/Extender: Rudene Re in Treatment: 6 Vital Signs Time Taken: 13:45 Temperature (F): 98.6 Height (in): 62 Pulse (bpm): 88 Weight (lbs): 151 Respiratory Rate (breaths/min): 24 Body Mass Index (BMI): 27.6 Blood Pressure (mmHg): 160/72 Reference  Range: 80 - 120 mg / dl Electronic Signature(s) Signed: 01/02/2015 4:49:26 PM By: Renee Harder RN Entered By: Renee Harder on 01/02/2015 13:46:04

## 2015-01-05 NOTE — Progress Notes (Signed)
Michelle Cabrera, Michelle Cabrera ([id number]) Visit Report for 11/18/2014 Allergy List Details Patient Name: Michelle Cabrera, Michelle Cabrera. Date of Service: 11/18/2014 1:15 PM Medical Record Number: [id number] Patient Account Number: [id number] Date of Birth/Sex: [birth year] (69 y.o. Female) Treating RN: Huel Coventry Primary Care Physician: Einar Crow Other Clinician: Referring Physician: Einar Crow Treating Physician/Extender: Rudene Re in Treatment: 0 Allergies Active Allergies codeine Reaction: lips swell Dilaudid Reaction: crazy Allergy Notes Electronic Signature(s) Signed: 11/18/2014 4:09:20 PM By: Elliot Gurney, RN, BSN, Kim RN, BSN Entered By: Elliot Gurney, RN, BSN, Kim on 11/18/2014 13:36:19 Michelle Cabrera ([id number]) -------------------------------------------------------------------------------- Arrival Information Details Patient Name: Michelle Cabrera Date of Service: 11/18/2014 1:15 PM Medical Record Number: [id number] Patient Account Number: [id number] Date of Birth/Sex: August 03, 1945 (69 y.o. Female) Treating RN: Huel Coventry Primary Care Physician: Einar Crow Other Clinician: Referring Physician: Einar Crow Treating Physician/Extender: Rudene Re in Treatment: 0 Visit Information Patient Arrived: Ambulatory Arrival Time: 13:21 Accompanied By: self Transfer Assistance: None Patient Identification Verified: Yes Secondary Verification Process Yes Completed: Patient Has Alerts: Yes Patient Alerts: 11/18/14 ABI: (L) 1.15 (R) 1.23 Electronic Signature(s) Signed: 01/04/2015 3:15:51 PM By: Elliot Gurney, RN, BSN, Kim RN, BSN Previous Signature: 11/18/2014 4:09:20 PM Version By: Elliot Gurney RN, BSN, Kim RN, BSN Entered By: Elliot Gurney, RN, BSN, Kim on 01/04/2015 15:15:50 Michelle Cabrera ([id number]) -------------------------------------------------------------------------------- Clinic Level of Care Assessment Details Patient Name: Michelle Cabrera Date of Service: 11/18/2014  1:15 PM Medical Record Number: [id number] Patient Account Number: [id number] Date of Birth/Sex: 1945-10-11 (69 y.o. Female) Treating RN: Huel Coventry Primary Care Physician: Einar Crow Other Clinician: Referring Physician: Einar Crow Treating Physician/Extender: Rudene Re in Treatment: 0 Clinic Level of Care Assessment Items TOOL 2 Quantity Score []  - Use when only an EandM is performed on the INITIAL visit 0 ASSESSMENTS - Nursing Assessment / Reassessment []  - General Physical Exam (combine w/ comprehensive assessment (listed just 0 below) when performed on new pt. evals) X - Comprehensive Assessment (HX, ROS, Risk Assessments, Wounds Hx, etc.) 1 25 ASSESSMENTS - Wound and Skin Assessment / Reassessment X - Simple Wound Assessment / Reassessment - one wound 1 5 []  - Complex Wound Assessment / Reassessment - multiple wounds 0 []  - Dermatologic / Skin Assessment (not related to wound area) 0 ASSESSMENTS - Ostomy and/or Continence Assessment and Care []  - Incontinence Assessment and Management 0 []  - Ostomy Care Assessment and Management (repouching, etc.) 0 PROCESS - Coordination of Care X - Simple Patient / Family Education for ongoing care 1 15 []  - Complex (extensive) Patient / Family Education for ongoing care 0 X - Staff obtains Chiropractor, Records, Test Results / Process Orders 1 10 []  - Staff telephones HHA, Nursing Homes / Clarify orders / etc 0 []  - Routine Transfer to another Facility (non-emergent condition) 0 []  - Routine Hospital Admission (non-emergent condition) 0 X - New Admissions / Manufacturing engineer / Ordering NPWT, Apligraf, etc. 1 15 []  - Emergency Hospital Admission (emergent condition) 0 X - Simple Discharge Coordination 1 10 Lawhorn, Maripaz H. ([id number]) []  - Complex (extensive) Discharge Coordination 0 PROCESS - Special Needs []  - Pediatric / Minor Patient Management 0 []  - Isolation Patient Management 0 []  - Hearing / Language /  Visual special needs 0 []  - Assessment of Community assistance (transportation, D/C planning, etc.) 0 []  - Additional assistance / Altered mentation 0 []  - Support Surface(s) Assessment (bed, cushion, seat, etc.) 0 INTERVENTIONS - Wound Cleansing / Measurement X - Wound Imaging (photographs - any number of wounds) 1  5 []  - Wound Tracing (instead of photographs) 0 X - Simple Wound Measurement - one wound 1 5 []  - Complex Wound Measurement - multiple wounds 0 X - Simple Wound Cleansing - one wound 1 5 []  - Complex Wound Cleansing - multiple wounds 0 INTERVENTIONS - Wound Dressings X - Small Wound Dressing one or multiple wounds 1 10 []  - Medium Wound Dressing one or multiple wounds 0 []  - Large Wound Dressing one or multiple wounds 0 []  - Application of Medications - injection 0 INTERVENTIONS - Miscellaneous []  - External ear exam 0 []  - Specimen Collection (cultures, biopsies, blood, body fluids, etc.) 0 []  - Specimen(s) / Culture(s) sent or taken to Lab for analysis 0 []  - Patient Transfer (multiple staff / Nurse, adult / Similar devices) 0 []  - Simple Staple / Suture removal (25 or less) 0 []  - Complex Staple / Suture removal (26 or more) 0 Jolly, Kynli H. ([id number]) []  - Hypo / Hyperglycemic Management (close monitor of Blood Glucose) 0 X - Ankle / Brachial Index (ABI) - do not check if billed separately 1 15 Has the patient been seen at the hospital within the last three years: Yes Total Score: 120 Level Of Care: New/Established - Level 4 Electronic Signature(s) Signed: 11/18/2014 4:09:20 PM By: Elliot Gurney, RN, BSN, Kim RN, BSN Entered By: Elliot Gurney, RN, BSN, Kim on 11/18/2014 14:18:29 Michelle Cabrera ([id number]) -------------------------------------------------------------------------------- Encounter Discharge Information Details Patient Name: Michelle Cabrera Date of Service: 11/18/2014 1:15 PM Medical Record Number: [id number] Patient Account Number: [id number] Date of  Birth/Sex: 07/05/1946 (69 y.o. Female) Treating RN: Huel Coventry Primary Care Physician: Einar Crow Other Clinician: Referring Physician: Einar Crow Treating Physician/Extender: Rudene Re in Treatment: 0 Encounter Discharge Information Items Discharge Pain Level: 0 Discharge Condition: Stable Ambulatory Status: Ambulatory Discharge Destination: Home Transportation: Private Auto Accompanied By: self Schedule Follow-up Appointment: Yes Medication Reconciliation completed and provided to Patient/Care Yes Yaphet Smethurst: Provided on Clinical Summary of Care: 11/18/2014 Form Type Recipient Paper Patient TO Electronic Signature(s) Signed: 11/18/2014 4:09:20 PM By: Elliot Gurney RN, BSN, Kim RN, BSN Previous Signature: 11/18/2014 2:15:43 PM Version By: Gwenlyn Perking Entered By: Elliot Gurney RN, BSN, Kim on 11/18/2014 14:19:30 Michelle Cabrera ([id number]) -------------------------------------------------------------------------------- Lower Extremity Assessment Details Patient Name: Michelle Cabrera Date of Service: 11/18/2014 1:15 PM Medical Record Number: [id number] Patient Account Number: [id number] Date of Birth/Sex: Dec 24, 1945 (69 y.o. Female) Treating RN: Huel Coventry Primary Care Physician: Einar Crow Other Clinician: Referring Physician: Einar Crow Treating Physician/Extender: Rudene Re in Treatment: 0 Edema Assessment Assessed: [Left: No] [Right: Yes] E[Left: dema] [Right: :] Calf Left: Right: Point of Measurement: 33 cm From Medial Instep 32.9 cm 32.5 cm Ankle Left: Right: Point of Measurement: 10 cm From Medial Instep 19.6 cm 18 cm Vascular Assessment Pulses: Posterior Tibial Palpable: [Left:Yes] [Right:Yes] Dorsalis Pedis Palpable: [Left:Yes] [Right:Yes] Extremity colors, hair growth, and conditions: Extremity Color: [Left:Normal] [Right:Normal] Hair Growth on Extremity: [Left:Yes] [Right:Yes] Temperature of Extremity: [Left:Warm]  [Right:Warm] Capillary Refill: [Left:< 3 seconds] [Right:< 3 seconds] Blood Pressure: Brachial: [Left:122] [Right:120] Dorsalis Pedis: 140 [Left:Dorsalis Pedis: 150] Ankle: Posterior Tibial: 122 [Left:Posterior Tibial: 130 1.15] [Right:1.23] Toe Nail Assessment Left: Right: Thick: No No Discolored: No No Deformed: No No Improper Length and Hygiene: No No Electronic Signature(s) JUDY, NOLF ([id number]) Signed: 11/18/2014 4:09:20 PM By: Elliot Gurney, RN, BSN, Kim RN, BSN Entered By: Elliot Gurney, RN, BSN, Kim on 11/18/2014 13:59:52 Michelle Cabrera ([id number]) -------------------------------------------------------------------------------- Multi Wound Chart Details Patient Name: Michelle Cabrera Date of Service:  11/18/2014 1:15 PM Medical Record Number: [id number] Patient Account Number: [id number] Date of Birth/Sex: 02/01/1946 (69 y.o. Female) Treating RN: Huel Coventry Primary Care Physician: Einar Crow Other Clinician: Referring Physician: Einar Crow Treating Physician/Extender: Rudene Re in Treatment: 0 Vital Signs Height(in): 62 Pulse(bpm): 99 Weight(lbs): 151 Blood Pressure 135/58 (mmHg): Body Mass Index(BMI): 28 Temperature(F): 98.1 Respiratory Rate 18 (breaths/min): Photos: [1:No Photos] [N/A:N/A] Wound Location: [1:Right Lower Leg - Medial] [N/A:N/A] Wounding Event: [1:Trauma] [N/A:N/A] Primary Etiology: [1:Venous Leg Ulcer] [N/A:N/A] Date Acquired: [1:11/03/2014] [N/A:N/A] Weeks of Treatment: [1:0] [N/A:N/A] Wound Status: [1:Open] [N/A:N/A] Measurements L x W x D 4x3.8x0.1 [N/A:N/A] (cm) Area (cm) : [1:11.938] [N/A:N/A] Volume (cm) : [1:1.194] [N/A:N/A] % Reduction in Area: [1:0.00%] [N/A:N/A] % Reduction in Volume: 0.00% [N/A:N/A] Classification: [1:Full Thickness Without Exposed Support Structures] [N/A:N/A] Exudate Amount: [1:Small] [N/A:N/A] Exudate Type: [1:Serous] [N/A:N/A] Exudate Color: [1:amber] [N/A:N/A] Wound Margin:  [1:Flat and Intact] [N/A:N/A] Granulation Amount: [1:None Present (0%)] [N/A:N/A] Necrotic Amount: [1:Large (67-100%)] [N/A:N/A] Necrotic Tissue: [1:Eschar] [N/A:N/A] Exposed Structures: [1:Fascia: No Fat: No Tendon: No Muscle: No Joint: No Bone: No] [N/A:N/A] Limited to Skin Breakdown Epithelialization: None N/A N/A Periwound Skin Texture: Edema: Yes N/A N/A Excoriation: No Induration: No Callus: No Crepitus: No Fluctuance: No Friable: No Rash: No Scarring: No Periwound Skin Moist: Yes N/A N/A Moisture: Maceration: No Dry/Scaly: No Periwound Skin Color: Atrophie Blanche: No N/A N/A Cyanosis: No Ecchymosis: No Erythema: No Hemosiderin Staining: No Mottled: No Pallor: No Rubor: No Tenderness on No N/A N/A Palpation: Wound Preparation: Ulcer Cleansing: N/A N/A Rinsed/Irrigated with Saline Topical Anesthetic Applied: Other: lidocaine 4% Treatment Notes Electronic Signature(s) Signed: 11/18/2014 4:09:20 PM By: Elliot Gurney, RN, BSN, Kim RN, BSN Entered By: Elliot Gurney, RN, BSN, Kim on 11/18/2014 13:47:50 Michelle Cabrera ([id number]) -------------------------------------------------------------------------------- Multi-Disciplinary Care Plan Details Patient Name: Michelle Cabrera Date of Service: 11/18/2014 1:15 PM Medical Record Number: [id number] Patient Account Number: [id number] Date of Birth/Sex: 10/07/1945 (69 y.o. Female) Treating RN: Huel Coventry Primary Care Physician: Einar Crow Other Clinician: Referring Physician: Einar Crow Treating Physician/Extender: Rudene Re in Treatment: 0 Active Inactive Orientation to the Wound Care Program Nursing Diagnoses: Knowledge deficit related to the wound healing center program Goals: Patient/caregiver will verbalize understanding of the Wound Healing Center Program Date Initiated: 11/18/2014 Goal Status: Active Interventions: Provide education on orientation to the wound center Notes: Wound/Skin  Impairment Nursing Diagnoses: Impaired tissue integrity Goals: Ulcer/skin breakdown will heal within 14 weeks Date Initiated: 11/18/2014 Goal Status: Active Interventions: Assess patient/caregiver ability to obtain necessary supplies Notes: Electronic Signature(s) Signed: 11/18/2014 4:09:20 PM By: Elliot Gurney, RN, BSN, Kim RN, BSN Entered By: Elliot Gurney, RN, BSN, Kim on 11/18/2014 14:59:33 Michelle Cabrera ([id number]) -------------------------------------------------------------------------------- Pain Assessment Details Patient Name: Michelle Cabrera Date of Service: 11/18/2014 1:15 PM Medical Record Number: [id number] Patient Account Number: [id number] Date of Birth/Sex: [birth year] (69 y.o. Female) Treating RN: Huel Coventry Primary Care Physician: Einar Crow Other Clinician: Referring Physician: Einar Crow Treating Physician/Extender: Rudene Re in Treatment: 0 Active Problems Location of Pain Severity and Description of Pain Patient Has Paino Yes Site Locations Pain Location: Pain in Ulcers With Dressing Change: Yes Rate the pain. Current Pain Level: 5 Character of Pain Describe the Pain: Burning, Shooting Pain Management and Medication Current Pain Management: Electronic Signature(s) Signed: 11/18/2014 4:09:20 PM By: Elliot Gurney, RN, BSN, Kim RN, BSN Entered By: Elliot Gurney, RN, BSN, Kim on 11/18/2014 13:26:13 Michelle Cabrera ([id number]) -------------------------------------------------------------------------------- Patient/Caregiver Education Details Patient Name: Michelle Cabrera Date of Service: 11/18/2014 1:15 PM Medical Record  Number: [id number] Patient Account Number: [id number] Date of Birth/Gender: August 28, 1945 (69 y.o. Female) Treating RN: Huel Coventry Primary Care Physician: Einar Crow Other Clinician: Referring Physician: Einar Crow Treating Physician/Extender: Rudene Re in Treatment: 0 Education Assessment Education Provided  To: Patient Education Topics Provided Smoking and Wound Healing: Handouts: Smoking and Wound Healing Methods: Demonstration, Explain/Verbal, Printed Responses: State content correctly Welcome To The [facility]: Handouts: Welcome To The [facility] Methods: Demonstration, Printed Responses: State content correctly Wound/Skin Impairment: Handouts: Caring for Your Ulcer Methods: Demonstration, Printed Responses: State content correctly Electronic Signature(s) Signed: 11/21/2014 4:53:45 PM By: Elliot Gurney, RN, BSN, Kim RN, BSN Previous Signature: 11/18/2014 4:09:20 PM Version By: Elliot Gurney RN, BSN, Kim RN, BSN Entered By: Elliot Gurney, RN, BSN, Kim on 11/21/2014 16:10:12 Michelle Cabrera ([id number]) -------------------------------------------------------------------------------- Wound Assessment Details Patient Name: Michelle Cabrera Date of Service: 11/18/2014 1:15 PM Medical Record Number: [id number] Patient Account Number: [id number] Date of Birth/Sex: November 01, 1945 (69 y.o. Female) Treating RN: Huel Coventry Primary Care Physician: Einar Crow Other Clinician: Referring Physician: Einar Crow Treating Physician/Extender: Rudene Re in Treatment: 0 Wound Status Wound Number: 1 Primary Etiology: Venous Leg Ulcer Wound Location: Right Lower Leg - Medial Wound Status: Open Wounding Event: Trauma Date Acquired: 11/03/2014 Weeks Of Treatment: 0 Clustered Wound: No Photos Photo Uploaded By: Elliot Gurney, RN, BSN, Kim on 11/18/2014 14:54:00 Wound Measurements Length: (cm) 4 Width: (cm) 3.8 Depth: (cm) 0.1 Area: (cm) 11.938 Volume: (cm) 1.194 % Reduction in Area: 0% % Reduction in Volume: 0% Epithelialization: None Tunneling: No Undermining: No Wound Description Full Thickness Without Exposed Classification: Support Structures Wound Margin: Flat and Intact Exudate Small Amount: Exudate Type: Serous Exudate Color: amber Wound Bed Granulation Amount: None  Present (0%) Exposed Structure Necrotic Amount: Large (67-100%) Fascia Exposed: No Necrotic Quality: Eschar Fat Layer Exposed: No Gillihan, Lyndzie H. ([id number]) Tendon Exposed: No Muscle Exposed: No Joint Exposed: No Bone Exposed: No Limited to Skin Breakdown Periwound Skin Texture Texture Color No Abnormalities Noted: No No Abnormalities Noted: No Callus: No Atrophie Blanche: No Crepitus: No Cyanosis: No Excoriation: No Ecchymosis: No Fluctuance: No Erythema: No Friable: No Hemosiderin Staining: No Induration: No Mottled: No Localized Edema: Yes Pallor: No Rash: No Rubor: No Scarring: No Moisture No Abnormalities Noted: No Dry / Scaly: No Maceration: No Moist: Yes Wound Preparation Ulcer Cleansing: Rinsed/Irrigated with Saline Topical Anesthetic Applied: Other: lidocaine 4%, Electronic Signature(s) Signed: 11/18/2014 4:09:20 PM By: Elliot Gurney, RN, BSN, Kim RN, BSN Entered By: Elliot Gurney, RN, BSN, Kim on 11/18/2014 13:35:37 Michelle Cabrera ([id number]) -------------------------------------------------------------------------------- Vitals Details Patient Name: Michelle Cabrera Date of Service: 11/18/2014 1:15 PM Medical Record Number: [id number] Patient Account Number: [id number] Date of Birth/Sex: 1946/03/27 (69 y.o. Female) Treating RN: Huel Coventry Primary Care Physician: Einar Crow Other Clinician: Referring Physician: Einar Crow Treating Physician/Extender: Rudene Re in Treatment: 0 Vital Signs Time Taken: 13:26 Temperature (F): 98.1 Height (in): 62 Pulse (bpm): 99 Source: Stated Respiratory Rate (breaths/min): 18 Weight (lbs): 151 Blood Pressure (mmHg): 135/58 Source: Stated Reference Range: 80 - 120 mg / dl Body Mass Index (BMI): 27.6 Electronic Signature(s) Signed: 11/18/2014 4:09:20 PM By: Elliot Gurney, RN, BSN, Kim RN, BSN Entered By: Elliot Gurney, RN, BSN, Kim on 11/18/2014 13:27:44

## 2015-01-09 ENCOUNTER — Encounter: Payer: Medicare Other | Admitting: Surgery

## 2015-01-09 DIAGNOSIS — L97212 Non-pressure chronic ulcer of right calf with fat layer exposed: Secondary | ICD-10-CM | POA: Diagnosis not present

## 2015-01-09 NOTE — Progress Notes (Signed)
Michelle Cabrera, Michelle Cabrera ([id number]) Visit Report for 01/09/2015 Arrival Information Details Patient Name: Michelle Cabrera, Michelle Cabrera. Date of Service: 01/09/2015 11:45 AM Medical Record Number: [id number] Patient Account Number: [id number] Date of Birth/Sex: 28-Aug-1945 (69 y.o. Female) Treating RN: Renee Harder Primary Care Physician: Einar Crow Other Clinician: Referring Physician: Einar Crow Treating Physician/Extender: Rudene Re in Treatment: 7 Visit Information History Since Last Visit Added or deleted any medications: No Patient Arrived: Ambulatory Any new allergies or adverse reactions: No Arrival Time: 11:48 Had a fall or experienced change in No Accompanied By: self activities of daily living that may affect Transfer Assistance: None risk of falls: Patient Identification Verified: Yes Signs or symptoms of abuse/neglect since last No Secondary Verification Process Yes visito Completed: Hospitalized since last visit: No Patient Has Alerts: Yes Has Dressing in Place as Prescribed: Yes Pain Present Now: No Electronic Signature(s) Signed: 01/09/2015 5:11:45 PM By: Renee Harder RN Entered By: Renee Harder on 01/09/2015 11:48:52 Michelle Cabrera ([id number]) -------------------------------------------------------------------------------- Encounter Discharge Information Details Patient Name: Michelle Cabrera Date of Service: 01/09/2015 11:45 AM Medical Record Number: [id number] Patient Account Number: [id number] Date of Birth/Sex: 11-Jan-1946 (69 y.o. Female) Treating RN: Primary Care Physician: Einar Crow Other Clinician: Referring Physician: Einar Crow Treating Physician/Extender: Rudene Re in Treatment: 7 Encounter Discharge Information Items Discharge Condition: Stable Ambulatory Status: Cane Discharge Destination: Home Transportation: Private Auto Accompanied By: self Schedule Follow-up Appointment: Yes Medication  Reconciliation completed No and provided to Patient/Care Ernestine Langworthy: Provided on Clinical Summary of Care: 01/09/2015 Form Type Recipient Paper Patient TO Electronic Signature(s) Signed: 01/09/2015 5:11:45 PM By: Renee Harder RN Previous Signature: 01/09/2015 12:15:31 PM Version By: Gwenlyn Perking Entered By: Renee Harder on 01/09/2015 16:57:12 Michelle Cabrera ([id number]) -------------------------------------------------------------------------------- Lower Extremity Assessment Details Patient Name: Michelle Cabrera Date of Service: 01/09/2015 11:45 AM Medical Record Number: [id number] Patient Account Number: [id number] Date of Birth/Sex: 07/30/1945 (69 y.o. Female) Treating RN: Renee Harder Primary Care Physician: Einar Crow Other Clinician: Referring Physician: Einar Crow Treating Physician/Extender: Rudene Re in Treatment: 7 Edema Assessment Assessed: [Left: No] [Right: Yes] Edema: [Left: Ye] [Right: s] Calf Left: Right: Point of Measurement: 33 cm From Medial Instep cm 31.8 cm Ankle Left: Right: Point of Measurement: 10 cm From Medial Instep cm 18.5 cm Vascular Assessment Claudication: Claudication Assessment [Right:None] Pulses: Posterior Tibial Dorsalis Pedis Palpable: [Right:Yes] Extremity colors, hair growth, and conditions: Extremity Color: [Right:Normal] Hair Growth on Extremity: [Right:Yes] Temperature of Extremity: [Right:Warm] Capillary Refill: [Right:< 3 seconds] Dependent Rubor: [Right:No] Blanched when Elevated: [Right:No] Lipodermatosclerosis: [Right:No] Toe Nail Assessment Left: Right: Thick: No Discolored: No Deformed: No Improper Length and Hygiene: No Michelle Cabrera, Michelle Cabrera ([id number]) Electronic Signature(s) Signed: 01/09/2015 5:11:45 PM By: Renee Harder RN Entered By: Renee Harder on 01/09/2015 11:53:03 Michelle Cabrera  ([id number]) -------------------------------------------------------------------------------- Multi Wound Chart Details Patient Name: Michelle Cabrera Date of Service: 01/09/2015 11:45 AM Medical Record Number: [id number] Patient Account Number: [id number] Date of Birth/Sex: Jul 04, 1946 (69 y.o. Female) Treating RN: Curtis Sites Primary Care Physician: Einar Crow Other Clinician: Referring Physician: Einar Crow Treating Physician/Extender: Rudene Re in Treatment: 7 Vital Signs Height(in): 62 Pulse(bpm): 93 Weight(lbs): 151 Blood Pressure 136/62 (mmHg): Body Mass Index(BMI): 28 Temperature(F): 98.1 Respiratory Rate 20 (breaths/min): Photos: [1:No Photos] [2:No Photos] [N/A:N/A] Wound Location: [1:Right Lower Leg - Medial Right Elbow] [N/A:N/A] Wounding Event: [1:Trauma] [2:Trauma] [N/A:N/A] Primary Etiology: [1:Trauma, Other] [2:Skin Tear] [N/A:N/A] Comorbid History: [1:Chronic Obstructive Pulmonary Disease (COPD), Lupus Erythematosus, Rheumatoid Arthritis, Osteoarthritis, Neuropathy Osteoarthritis, Neuropathy] [2:Chronic Obstructive Pulmonary Disease (  COPD), Lupus Erythematosus, Rheumatoid  Arthritis,] [N/A:N/A] Date Acquired: [1:11/03/2014] [2:12/14/2014] [N/A:N/A] Weeks of Treatment: [1:7] [2:0] [N/A:N/A] Wound Status: [1:Open] [2:Open] [N/A:N/A] Measurements L x W x D 0.9x1.8x0.2 [2:1.4x1.2x0.1] [N/A:N/A] (cm) Area (cm) : [1:1.272] [2:1.319] [N/A:N/A] Volume (cm) : [1:0.254] [2:0.132] [N/A:N/A] % Reduction in Area: [1:89.30%] [2:0.00%] [N/A:N/A] % Reduction in Volume: 78.70% [2:0.00%] [N/A:N/A] Classification: [1:Full Thickness Without Exposed Support Structures] [2:Full Thickness Without Exposed Support Structures] [N/A:N/A] Exudate Amount: [1:Small] [2:None Present] [N/A:N/A] Exudate Type: [1:Serous] [2:N/A] [N/A:N/A] Exudate Color: [1:amber] [2:N/A] [N/A:N/A] Wound Margin: [1:Flat and Intact] [2:Distinct, outline attached N/A] Granulation  Amount: [1:Medium (34-66%)] [2:Small (1-33%)] [N/A:N/A] Granulation Quality: [1:Pink] [2:Pink, Pale] [N/A:N/A] Necrotic Amount: [1:Small (1-33%)] [2:Small (1-33%)] [N/A:N/A] Exposed Structures: [2:N/A] [N/A:N/A] Fascia: No Fat: No Tendon: No Muscle: No Joint: No Bone: No Limited to Skin Breakdown Epithelialization: None Small (1-33%) N/A Periwound Skin Texture: Edema: Yes Edema: No N/A Excoriation: No Excoriation: No Induration: No Induration: No Callus: No Callus: No Crepitus: No Crepitus: No Fluctuance: No Fluctuance: No Friable: No Friable: No Rash: No Rash: No Scarring: No Scarring: No Periwound Skin Moist: Yes Dry/Scaly: Yes N/A Moisture: Maceration: No Maceration: No Dry/Scaly: No Moist: No Periwound Skin Color: Atrophie Blanche: No Atrophie Blanche: No N/A Cyanosis: No Cyanosis: No Ecchymosis: No Ecchymosis: No Erythema: No Erythema: No Hemosiderin Staining: No Hemosiderin Staining: No Mottled: No Mottled: No Pallor: No Pallor: No Rubor: No Rubor: No Temperature: No Abnormality No Abnormality N/A Tenderness on Yes No N/A Palpation: Wound Preparation: Ulcer Cleansing: Ulcer Cleansing: N/A Rinsed/Irrigated with Rinsed/Irrigated with Saline Saline Topical Anesthetic Topical Anesthetic Applied: Other: lidocaine Applied: Other: lidocaine 4% 4% Treatment Notes Electronic Signature(s) Signed: 01/09/2015 5:12:42 PM By: Curtis Sites Entered By: Curtis Sites on 01/09/2015 12:02:46 Michelle Cabrera ([id number]) -------------------------------------------------------------------------------- Multi-Disciplinary Care Plan Details Patient Name: Michelle Cabrera Date of Service: 01/09/2015 11:45 AM Medical Record Number: [id number] Patient Account Number: [id number] Date of Birth/Sex: [birth year] (69 y.o. Female) Treating RN: Curtis Sites Primary Care Physician: Einar Crow Other Clinician: Referring Physician: Einar Crow Treating Physician/Extender: Rudene Re in Treatment: 7 Active Inactive Orientation to the Wound Care Program Nursing Diagnoses: Knowledge deficit related to the wound healing center program Goals: Patient/caregiver will verbalize understanding of the Wound Healing Center Program Date Initiated: 11/18/2014 Goal Status: Active Interventions: Provide education on orientation to the wound center Notes: Wound/Skin Impairment Nursing Diagnoses: Impaired tissue integrity Goals: Ulcer/skin breakdown will heal within 14 weeks Date Initiated: 11/18/2014 Goal Status: Active Interventions: Assess patient/caregiver ability to obtain necessary supplies Notes: Electronic Signature(s) Signed: 01/09/2015 5:12:42 PM By: Curtis Sites Entered By: Curtis Sites on 01/09/2015 12:02:31 Michelle Cabrera ([id number]) -------------------------------------------------------------------------------- Pain Assessment Details Patient Name: Michelle Cabrera Date of Service: 01/09/2015 11:45 AM Medical Record Number: [id number] Patient Account Number: [id number] Date of Birth/Sex: 05-Aug-1945 (69 y.o. Female) Treating RN: Renee Harder Primary Care Physician: Einar Crow Other Clinician: Referring Physician: Einar Crow Treating Physician/Extender: Rudene Re in Treatment: 7 Active Problems Location of Pain Severity and Description of Pain Patient Has Paino No Site Locations Pain Management and Medication Current Pain Management: Electronic Signature(s) Signed: 01/09/2015 5:11:45 PM By: Renee Harder RN Entered By: Renee Harder on 01/09/2015 11:50:06 Michelle Cabrera ([id number]) -------------------------------------------------------------------------------- Patient/Caregiver Education Details Patient Name: Michelle Cabrera Date of Service: 01/09/2015 11:45 AM Medical Record Number: [id number] Patient Account Number: [id number] Date of Birth/Gender:  1946-06-09 (69 y.o. Female) Treating RN: Curtis Sites Primary Care Physician: Einar Crow Other Clinician: Referring Physician: Einar Crow Treating Physician/Extender: Rudene Re in Treatment:  7 Education Assessment Education Provided To: Patient Education Topics Provided Wound/Skin Impairment: Handouts: Other: wound care as ordered on new wound and see PCP about blisters on back Methods: Demonstration, Explain/Verbal Responses: State content correctly Electronic Signature(s) Signed: 01/09/2015 5:12:42 PM By: Curtis Sites Entered By: Curtis Sites on 01/09/2015 12:09:24 Michelle Cabrera ([id number]) -------------------------------------------------------------------------------- Wound Assessment Details Patient Name: Michelle Cabrera Date of Service: 01/09/2015 11:45 AM Medical Record Number: [id number] Patient Account Number: [id number] Date of Birth/Sex: [birth year] (69 y.o. Female) Treating RN: Renee Harder Primary Care Physician: Einar Crow Other Clinician: Referring Physician: Einar Crow Treating Physician/Extender: Rudene Re in Treatment: 7 Wound Status Wound Number: 1 Primary Trauma, Other Etiology: Wound Location: Right Lower Leg - Medial Wound Open Wounding Event: Trauma Status: Date Acquired: 11/03/2014 Comorbid Chronic Obstructive Pulmonary Disease Weeks Of Treatment: 7 History: (COPD), Lupus Erythematosus, Clustered Wound: No Rheumatoid Arthritis, Osteoarthritis, Neuropathy Photos Photo Uploaded By: Renee Harder on 01/09/2015 12:25:46 Wound Measurements Length: (cm) 0.9 Width: (cm) 1.8 Depth: (cm) 0.2 Area: (cm) 1.272 Volume: (cm) 0.254 % Reduction in Area: 89.3% % Reduction in Volume: 78.7% Epithelialization: None Wound Description Full Thickness Without Exposed Classification: Support Structures Wound Margin: Flat and Intact Exudate Small Amount: Exudate Type: Serous Exudate Color:  amber Foul Odor After Cleansing: No Wound Bed Granulation Amount: Medium (34-66%) Exposed Structure Michelle Cabrera, AKAMINE. ([id number]) Granulation Quality: Pink Fascia Exposed: No Necrotic Amount: Small (1-33%) Fat Layer Exposed: No Necrotic Quality: Adherent Slough Tendon Exposed: No Muscle Exposed: No Joint Exposed: No Bone Exposed: No Limited to Skin Breakdown Periwound Skin Texture Texture Color No Abnormalities Noted: No No Abnormalities Noted: No Callus: No Atrophie Blanche: No Crepitus: No Cyanosis: No Excoriation: No Ecchymosis: No Fluctuance: No Erythema: No Friable: No Hemosiderin Staining: No Induration: No Mottled: No Localized Edema: Yes Pallor: No Rash: No Rubor: No Scarring: No Temperature / Pain Moisture Temperature: No Abnormality No Abnormalities Noted: No Tenderness on Palpation: Yes Dry / Scaly: No Maceration: No Moist: Yes Wound Preparation Ulcer Cleansing: Rinsed/Irrigated with Saline Topical Anesthetic Applied: Other: lidocaine 4%, Treatment Notes Wound #1 (Right, Medial Lower Leg) 1. Cleansed with: Clean wound with Normal Saline 2. Anesthetic Topical Lidocaine 4% cream to wound bed prior to debridement 4. Dressing Applied: Hydrafera Blue 5. Secondary Dressing Applied Bordered Foam Dressing Electronic Signature(s) Signed: 01/09/2015 5:11:45 PM By: Renee Harder RN Entered By: Renee Harder on 01/09/2015 11:53:57 Michelle Cabrera ([id number]) -------------------------------------------------------------------------------- Wound Assessment Details Patient Name: Michelle Cabrera Date of Service: 01/09/2015 11:45 AM Medical Record Number: [id number] Patient Account Number: [id number] Date of Birth/Sex: 08-Mar-1946 (69 y.o. Female) Treating RN: Renee Harder Primary Care Physician: Einar Crow Other Clinician: Referring Physician: Einar Crow Treating Physician/Extender: Rudene Re in Treatment: 7 Wound  Status Wound Number: 2 Primary Skin Tear Etiology: Wound Location: Right Elbow Wound Open Wounding Event: Trauma Status: Date Acquired: 12/14/2014 Comorbid Chronic Obstructive Pulmonary Disease Weeks Of Treatment: 0 History: (COPD), Lupus Erythematosus, Clustered Wound: No Rheumatoid Arthritis, Osteoarthritis, Neuropathy Photos Photo Uploaded By: Renee Harder on 01/09/2015 12:25:46 Wound Measurements Length: (cm) 1.4 Width: (cm) 1.2 Depth: (cm) 0.1 Area: (cm) 1.319 Volume: (cm) 0.132 % Reduction in Area: 0% % Reduction in Volume: 0% Epithelialization: Small (1-33%) Tunneling: No Undermining: No Wound Description Full Thickness Without Exposed Classification: Support Structures Wound Margin: Distinct, outline attached Michelle Cabrera, VIPOND. ([id number]) Foul Odor After Cleansing: No Exudate None Present Amount: Wound Bed Granulation Amount: Small (1-33%) Granulation Quality: Pink, Pale Necrotic Amount: Small (1-33%) Necrotic Quality: Adherent Slough Periwound Skin Texture  Texture Color No Abnormalities Noted: No No Abnormalities Noted: No Callus: No Atrophie Blanche: No Crepitus: No Cyanosis: No Excoriation: No Ecchymosis: No Fluctuance: No Erythema: No Friable: No Hemosiderin Staining: No Induration: No Mottled: No Localized Edema: No Pallor: No Rash: No Rubor: No Scarring: No Temperature / Pain Moisture Temperature: No Abnormality No Abnormalities Noted: No Dry / Scaly: Yes Maceration: No Moist: No Wound Preparation Ulcer Cleansing: Rinsed/Irrigated with Saline Topical Anesthetic Applied: Other: lidocaine 4%, Treatment Notes Wound #2 (Right Elbow) 1. Cleansed with: Clean wound with Normal Saline 2. Anesthetic Topical Lidocaine 4% cream to wound bed prior to debridement 4. Dressing Applied: Prisma Ag 5. Secondary Dressing Applied Bordered Foam Dressing Electronic Signature(s) Signed: 01/09/2015 5:11:45 PM By: Renee Harder RN Entered  By: Renee Harder on 01/09/2015 11:56:16 Michelle Cabrera ([id number]) -------------------------------------------------------------------------------- Vitals Details Patient Name: Michelle Cabrera Date of Service: 01/09/2015 11:45 AM Medical Record Number: [id number] Patient Account Number: [id number] Date of Birth/Sex: [birth year] (69 y.o. Female) Treating RN: Renee Harder Primary Care Physician: Einar Crow Other Clinician: Referring Physician: Einar Crow Treating Physician/Extender: Rudene Re in Treatment: 7 Vital Signs Time Taken: 11:50 Temperature (F): 98.1 Height (in): 62 Pulse (bpm): 93 Weight (lbs): 151 Respiratory Rate (breaths/min): 20 Body Mass Index (BMI): 27.6 Blood Pressure (mmHg): 136/62 Reference Range: 80 - 120 mg / dl Electronic Signature(s) Signed: 01/09/2015 5:11:45 PM By: Renee Harder RN Entered By: Renee Harder on 01/09/2015 11:50:26

## 2015-01-09 NOTE — Progress Notes (Signed)
[name], [name] ([id number]) Visit Report for 01/09/2015 Chief Complaint Document Details Patient Name: Michelle Cabrera, Michelle Cabrera 01/09/2015 11:45 Date of Service: AM Medical Record [id number] Number: Patient Account Number: [id number] 1946-02-02 (69 y.o. Treating RN: Date of Birth/Sex: Female) Other Clinician: Primary Care Physician: Einar Crow Treating Evlyn Kanner Referring Physician: Einar Crow Physician/Extender: Tania Ade in Treatment: 7 Information Obtained from: Patient Chief Complaint Patient presents to the wound care center for a consult due non healing wound Pleasant 69 year old patient who comes with a history of having an injury to her right lower extremity approximately 2 weeks ago. She hit a briar bush and injured this area. 01/09/2015 -- she had a laceration on her right elbow region by hitting the door a couple of weeks ago and now wants [place] to take a look at it. Electronic Signature(s) Signed: 01/09/2015 12:20:11 PM By: Evlyn Kanner MD, FACS Entered By: Evlyn Kanner on 01/09/2015 12:07:45 Michelle Cabrera ([id number]) -------------------------------------------------------------------------------- Debridement Details Patient Name: Michelle Cabrera, Michelle Cabrera 01/09/2015 11:45 Date of Service: AM Medical Record [id number] Number: Patient Account Number: [id number] August 28, 1945 (69 y.o. Treating RN: Date of Birth/Sex: Female) Other Clinician: Primary Care Physician: Einar Crow Treating Evlyn Kanner Referring Physician: Einar Crow Physician/Extender: Tania Ade in Treatment: 7 Debridement Performed for Wound #1 Right,Medial Lower Leg Assessment: Performed By: Physician Tristan Schroeder., MD Debridement: Open Wound/Selective Debridement Selective Description: Pre-procedure Yes Verification/Time Out Taken: Start Time: 12:03 Pain Control: Lidocaine 4% Topical Solution Level: Non-Viable Tissue Total Area Debrided (L x 9 (cm) x 1.8 (cm) = 16.2  (cm) W): Instrument: Forceps, Other : gauze and saline Bleeding: None End Time: 12:05 Procedural Pain: 0 Post Procedural Pain: 0 Response to Treatment: Procedure was tolerated well Post Debridement Measurements of Total Wound Length: (cm) 0.9 Width: (cm) 1.8 Depth: (cm) 0.2 Volume: (cm) 0.254 Electronic Signature(s) Signed: 01/09/2015 12:20:11 PM By: Evlyn Kanner MD, FACS Entered By: Evlyn Kanner on 01/09/2015 12:07:15 Michelle Cabrera ([id number]) -------------------------------------------------------------------------------- HPI Details Patient Name: Michelle Cabrera 01/09/2015 11:45 Date of Service: AM Medical Record [id number] Number: Patient Account Number: [id number] 1945/08/29 (69 y.o. Treating RN: Date of Birth/Sex: Female) Other Clinician: Primary Care Physician: Einar Crow Treating Evlyn Kanner Referring Physician: Einar Crow Physician/Extender: Tania Ade in Treatment: 7 History of Present Illness Location: right lower extremity Quality: Patient reports experiencing a dull pain to affected area(s). Severity: Patient states wound (s) are getting better. Duration: Patient has had the wound for < 2 weeks prior to presenting for treatment Timing: Pain in wound is Intermittent (comes and goes Context: The wound occurred when the patient was injured by a [employer] Modifying Factors: Other treatment(s) tried include: local care and doxycycline orally. Associated Signs and Symptoms: Patient reports having difficulty standing for long periods. HPI Description: once she injured her leg she has been applying Neosporin and covering it with a bandage. She has been on doxycycline and has taken a course of his treatment and will finish her tablets today. Her comorbidities include osteoporosis, coronary syndrome, neuralgia, spinal stenosis, hypertension, rheumatoid arthritis, GERD, COPD. Reviewed her list of her medications and note that she is on  regular prednisone on a daily basis. She is also a smoker and smokes a pack of cigarettes daily and has been for about 56 years. She does not have diabetes. She has been treated with doxycycline for the last 2 weeks. 11/25/2014 -- the edema and a right lower extremity had gone up significantly and her primary care doctor has increased her diuretic. she continues to smoke about 12-15  cigarettes a day and is going to start on nicotine patches tomorrow. She has completed her course of antibiotics. 12/02/2014 -- she says she's been taking her diuretics regularly and is swelling of the leg has gone down remarkably. Uses the nicotine patches now and her smoking has gone down to a few cigarettes a day. Does complain of pain in her legs and calf and she thinks this may have something to do with her constant back problems and several back surgeries. 12/08/2014 -- the pain in the right lower extremity continued to be there and she saw her PCP who got a DVT study done. This report dated 12/06/2014 and there was no evidence of DVT in the right lower extremity. She continues to smoke however and has been not able to give it up completely yet. 12/23/2014 - No new complaints today. Pain slightly improved. No fever or chills. 01/02/2015 -- continues to do well and has been having a few cigarettes a day. Using nicotine gum and other methods to quit smoking. Michelle Cabrera, Michelle Cabrera ([id number]) 01/09/2015 -- the area on her right elbow was injured due to abrasion with a door and it has now opened out and become an ulcer. Electronic Signature(s) Signed: 01/09/2015 12:20:11 PM By: Evlyn Kanner MD, FACS Entered By: Evlyn Kanner on 01/09/2015 12:08:33 Michelle Cabrera ([id number]) -------------------------------------------------------------------------------- Physical Exam Details Patient Name: Michelle Cabrera, Michelle Cabrera 01/09/2015 11:45 Date of Service: AM Medical Record [id number] Number: Patient Account Number:  [id number] [birth year] (69 y.o. Treating RN: Date of Birth/Sex: Female) Other Clinician: Primary Care Physician: Einar Crow Treating Evlyn Kanner Referring Physician: Einar Crow Physician/Extender: Tania Ade in Treatment: 7 Constitutional . Pulse regular. Respirations normal and unlabored. Afebrile. . Eyes Nonicteric. Reactive to light. Ears, Nose, Mouth, and Throat Lips, teeth, and gums WNL.Marland Kitchen Moist mucosa without lesions . Neck supple and nontender. No palpable supraclavicular or cervical adenopathy. Normal sized without goiter. Respiratory WNL. No retractions.. Cardiovascular Pedal Pulses WNL. No clubbing, cyanosis or edema. Integumentary (Hair, Skin) her right lateral elbow has a clean ulcerated area and we did not need any debridement. The ulcer on her right lower extremity has some slough which we need sharp debridement.Marland Kitchen Psychiatric Judgement and insight Intact.. No evidence of depression, anxiety, or agitation.. Electronic Signature(s) Signed: 01/09/2015 12:20:11 PM By: Evlyn Kanner MD, FACS Entered By: Evlyn Kanner on 01/09/2015 12:09:23 Michelle Cabrera ([id number]) -------------------------------------------------------------------------------- Physician Orders Details Patient Name: Michelle Cabrera, Michelle Cabrera 01/09/2015 11:45 Date of Service: AM Medical Record [id number] Number: Patient Account Number: [id number] 09/15/1945 (69 y.o. Treating RN: Curtis Sites Date of Birth/Sex: Female) Other Clinician: Primary Care Physician: Einar Crow Treating Evlyn Kanner Referring Physician: Einar Crow Physician/Extender: Tania Ade in Treatment: 7 Verbal / Phone Orders: Yes Clinician: Curtis Sites Read Back and Verified: Yes Diagnosis Coding Wound Cleansing Wound #1 Right,Medial Lower Leg o Clean wound with Normal Saline. Wound #2 Right Elbow o Clean wound with Normal Saline. Anesthetic Wound #1 Right,Medial Lower Leg o Topical Lidocaine 4%  cream applied to wound bed prior to debridement Wound #2 Right Elbow o Topical Lidocaine 4% cream applied to wound bed prior to debridement Skin Barriers/Peri-Wound Care Wound #1 Right,Medial Lower Leg o Skin Prep Wound #2 Right Elbow o Skin Prep Primary Wound Dressing Wound #1 Right,Medial Lower Leg o Hydrafera Blue Wound #2 Right Elbow o Prisma Ag Secondary Dressing Wound #1 Right,Medial Lower Leg o Boardered Foam Dressing Wound #2 Right Elbow o Boardered Foam Dressing Michelle Cabrera, Michelle H. ([id number]) Dressing Change Frequency Wound #1 Right,Medial Lower Leg o Change  dressing every other day. Wound #2 Right Elbow o Change dressing every other day. Follow-up Appointments Wound #1 Right,Medial Lower Leg o Return Appointment in 1 week. Wound #2 Right Elbow o Return Appointment in 1 week. Edema Control Wound #1 Right,Medial Lower Leg o Elevate legs to the level of the heart and pump ankles as often as possible o Tubigrip - double layer of Tubigrip Additional Orders / Instructions Wound #1 Right,Medial Lower Leg o Stop Smoking Wound #2 Right Elbow o Stop Smoking Electronic Signature(s) Signed: 01/09/2015 12:20:11 PM By: Evlyn Kanner MD, FACS Signed: 01/09/2015 5:12:42 PM By: Curtis Sites Entered By: Curtis Sites on 01/09/2015 12:08:41 Michelle Cabrera ([id number]) -------------------------------------------------------------------------------- Problem List Details Patient Name: BLOSSIE, HOWTON 01/09/2015 11:45 Date of Service: AM Medical Record [id number] Number: Patient Account Number: [id number] 1945-08-12 (69 y.o. 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 F17.218 Nicotine dependence, cigarettes, with other nicotine- 11/18/2014 Yes induced disorders S41.111A Laceration without foreign body of right upper arm, initial 01/09/2015 Yes encounter Inactive Problems Resolved Problems Electronic Signature(s) Signed: 01/09/2015 12:20:11 PM By: Evlyn Kanner MD, FACS Entered By: Evlyn Kanner on 01/09/2015 12:06:53 Michelle Cabrera ([id number]) -------------------------------------------------------------------------------- Progress Note Details Patient Name: Michelle Cabrera 01/09/2015 11:45 Date of Service: AM Medical Record [id number] Number: Patient Account Number: [id number] 06/16/1946 (69 y.o. Treating RN: Date of Birth/Sex: Female) Other Clinician: Primary Care Physician: Einar Crow Treating Evlyn Kanner Referring Physician: Einar Crow Physician/Extender: Tania Ade in Treatment: 7 Subjective Chief Complaint Information obtained from Patient Patient presents to the wound care center for a consult due non healing wound Pleasant 69 year old patient who comes with a history of having an injury to her right lower extremity approximately 2 weeks ago. She hit a briar bush and injured this area. 01/09/2015 -- she had a laceration on her right elbow region by hitting the door a couple of weeks ago and now wants [place] to take a look at it. History of Present Illness (HPI) The following HPI elements were documented for the patient's wound: Location: right lower extremity Quality: Patient reports experiencing a dull pain to affected area(s). Severity: Patient states wound (s) are getting better. Duration: Patient has had the wound for < 2 weeks prior to presenting for treatment Timing: Pain in wound is Intermittent (comes and goes Context: The wound occurred when the patient was injured by a  [employer] Modifying Factors: Other treatment(s) tried include: local care and doxycycline orally. Associated Signs and Symptoms: Patient reports having difficulty standing for long periods. once she injured her leg she has been applying Neosporin and covering it with a bandage. She has been on doxycycline and has taken a course of his treatment and will finish her tablets today. Her comorbidities include osteoporosis, coronary syndrome, neuralgia, spinal stenosis, hypertension, rheumatoid arthritis, GERD, COPD. Reviewed her list of her medications and note that she is on regular prednisone on a daily basis. She is also a smoker and smokes a pack of cigarettes daily and has been for about 56 years. She does not have diabetes. She has been treated with doxycycline for the last 2 weeks. 11/25/2014 -- the edema and a right lower extremity had gone up significantly and her primary care  doctor has increased her diuretic. she continues to smoke about 12-15 cigarettes a day and is going to start on nicotine patches tomorrow. She has completed her course of antibiotics. 12/02/2014 -- she says she's been taking her diuretics regularly and is swelling of the leg has gone down remarkably. Uses the nicotine patches now and her smoking has gone down to a few cigarettes a day. Does complain of pain in her legs and calf and she thinks this may have something to do with her constant back problems and several back surgeries. Michelle Cabrera, Michelle Cabrera ([id number]) 12/08/2014 -- the pain in the right lower extremity continued to be there and she saw her PCP who got a DVT study done. This report dated 12/06/2014 and there was no evidence of DVT in the right lower extremity. She continues to smoke however and has been not able to give it up completely yet. 12/23/2014 - No new complaints today. Pain slightly improved. No fever or chills. 01/02/2015 -- continues to do well and has been having a few cigarettes a day. Using  nicotine gum and other methods to quit smoking. 01/09/2015 -- the area on her right elbow was injured due to abrasion with a door and it has now opened out and become an ulcer. Objective Constitutional Pulse regular. Respirations normal and unlabored. Afebrile. Vitals Time Taken: 11:50 AM, Height: 62 in, Weight: 151 lbs, BMI: 27.6, Temperature: 98.1 F, Pulse: 93 bpm, Respiratory Rate: 20 breaths/min, Blood Pressure: 136/62 mmHg. Eyes Nonicteric. Reactive to light. Ears, Nose, Mouth, and Throat Lips, teeth, and gums WNL.Marland Kitchen Moist mucosa without lesions . Neck supple and nontender. No palpable supraclavicular or cervical adenopathy. Normal sized without goiter. Respiratory WNL. No retractions.. Cardiovascular Pedal Pulses WNL. No clubbing, cyanosis or edema. Psychiatric Judgement and insight Intact.. No evidence of depression, anxiety, or agitation.. Integumentary (Hair, Skin) her right lateral elbow has a clean ulcerated area and we did not need any debridement. The ulcer on her right lower extremity has some slough which we need sharp debridement.Marland Kitchen Michelle Cabrera, Michelle Cabrera ([id number]) Wound #1 status is Open. Original cause of wound was Trauma. The wound is located on the Right,Medial Lower Leg. The wound measures 0.9cm length x 1.8cm width x 0.2cm depth; 1.272cm^2 area and 0.254cm^3 volume. The wound is limited to skin breakdown. There is a small amount of serous drainage noted. The wound margin is flat and intact. There is medium (34-66%) pink granulation within the wound bed. There is a small (1-33%) amount of necrotic tissue within the wound bed including Adherent Slough. The periwound skin appearance exhibited: Localized Edema, Moist. The periwound skin appearance did not exhibit: Callus, Crepitus, Excoriation, Fluctuance, Friable, Induration, Rash, Scarring, Dry/Scaly, Maceration, Atrophie Blanche, Cyanosis, Ecchymosis, Hemosiderin Staining, Mottled, Pallor, Rubor, Erythema.  Periwound temperature was noted as No Abnormality. The periwound has tenderness on palpation. Wound #2 status is Open. Original cause of wound was Trauma. The wound is located on the Right Elbow. The wound measures 1.4cm length x 1.2cm width x 0.1cm depth; 1.319cm^2 area and 0.132cm^3 volume. There is no tunneling or undermining noted. There is a none present amount of drainage noted. The wound margin is distinct with the outline attached to the wound base. There is small (1-33%) pink, pale granulation within the wound bed. There is a small (1-33%) amount of necrotic tissue within the wound bed including Adherent Slough. The periwound skin appearance exhibited: Dry/Scaly. The periwound skin appearance did not exhibit: Callus, Crepitus, Excoriation, Fluctuance, Friable, Induration, Localized Edema, Rash, Scarring, Maceration,  Moist, Atrophie Blanche, Cyanosis, Ecchymosis, Hemosiderin Staining, Mottled, Pallor, Rubor, Erythema. Periwound temperature was noted as No Abnormality. 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 Continue with Hydrofera Blue on her lower extremity. I have urged her to give up smoking completely and she understands the importance of this. Procedures Wound #1 Michelle Cabrera, Michelle Cabrera ([id number]) Wound #1 is a Trauma, Other located on the Right,Medial Lower Leg . There was a Non-Viable Tissue Open Wound/Selective [id number]) debridement with total area of 16.2 sq cm performed by Conny Situ, Ignacia Felling., MD. with the following instrument(s): Forceps and gauze and saline after achieving pain  control using Lidocaine 4% Topical Solution. A time out was conducted prior to the start of the procedure. There was no bleeding. The procedure was tolerated well with a pain level of 0 throughout and a pain level of 0 following the procedure. Post Debridement Measurements: 0.9cm length x 1.8cm width x 0.2cm depth; 0.254cm^3 volume. Plan Wound Cleansing: Wound #1 Right,Medial Lower Leg: Clean wound with Normal Saline. Wound #2 Right Elbow: Clean wound with Normal Saline. Anesthetic: Wound #1 Right,Medial Lower Leg: Topical Lidocaine 4% cream applied to wound bed prior to debridement Wound #2 Right Elbow: Topical Lidocaine 4% cream applied to wound bed prior to debridement Skin Barriers/Peri-Wound Care: Wound #1 Right,Medial Lower Leg: Skin Prep Wound #2 Right Elbow: Skin Prep Primary Wound Dressing: Wound #1 Right,Medial Lower Leg: Hydrafera Blue Wound #2 Right Elbow: Prisma Ag Secondary Dressing: Wound #1 Right,Medial Lower Leg: Boardered Foam Dressing Wound #2 Right Elbow: Boardered Foam Dressing Dressing Change Frequency: Wound #1 Right,Medial Lower Leg: Change dressing every other day. Wound #2 Right Elbow: Change dressing every other day. Follow-up Appointments: Wound #1 Right,Medial Lower Leg: Return Appointment in 1 week. Wound #2 Right Elbow: Return Appointment in 1 week. Edema Control: Michelle Cabrera, Michelle Cabrera ([id number]) Wound #1 Right,Medial Lower Leg: Elevate legs to the level of the heart and pump ankles as often as possible Tubigrip - double layer of Tubigrip Additional Orders / Instructions: Wound #1 Right,Medial Lower Leg: Stop Smoking Wound #2 Right Elbow: Stop Smoking I have recommended using Prisma AG over her right elbow and putting a bordered foam over this. Continue with Hydrofera Blue on her lower extremity. I have urged her to give up smoking completely and she understands the importance of this. Electronic Signature(s) Signed: 01/09/2015  12:20:11 PM By: Evlyn Kanner MD, FACS Entered By: Evlyn Kanner on 01/09/2015 12:10:27 Michelle Cabrera ([id number]) -------------------------------------------------------------------------------- SuperBill Details Patient Name: Michelle Cabrera Date of Service: 01/09/2015 Medical Record Number: [id number] Patient Account Number: [id number] Date of Birth/Sex: [birth year] (69 y.o. Female) Treating RN: Primary Care Physician: Einar Crow Other Clinician: Referring Physician: Einar Crow Treating Physician/Extender: Rudene Re in Treatment: 7 Diagnosis Coding ICD-10 Codes Code Description [phone] Laceration without foreign body, right lower leg, initial encounter L97.212 Non-pressure chronic ulcer of right calf with fat layer exposed F17.218 Nicotine dependence, cigarettes, with other nicotine-induced disorders S41.111A Laceration without foreign body of right upper arm, initial encounter Facility Procedures CPT4 Code Description: [id number] - DEBRIDE WOUND 1ST 20 SQ CM OR < ICD-10 Description Diagnosis S81.811A Laceration without foreign body, right lower leg, init L97.212  Non-pressure chronic ulcer of right calf with fat laye Modifier: ial encounte r exposed Quantity: 1 r Physician Procedures CPT4 Code Description: [id number] - WC PHYS DEBR WO ANESTH 20 SQ CM ICD-10 Description Diagnosis S81.811A Laceration without foreign body, right lower leg, init L97.212 Non-pressure chronic ulcer of right calf with fat laye Modifier: ial encounte r exposed Quantity: 1 r Electronic Signature(s) Signed: 01/09/2015 12:20:11 PM By: Evlyn Kanner MD, FACS Entered By: Evlyn Kanner on 01/09/2015 12:10:41

## 2015-01-16 ENCOUNTER — Ambulatory Visit: Payer: BLUE CROSS/BLUE SHIELD | Admitting: Surgery

## 2015-01-17 ENCOUNTER — Encounter: Payer: Medicare Other | Admitting: Surgery

## 2015-01-17 DIAGNOSIS — L97212 Non-pressure chronic ulcer of right calf with fat layer exposed: Secondary | ICD-10-CM | POA: Diagnosis not present

## 2015-01-18 NOTE — Progress Notes (Signed)
Michelle, Cabrera ([id number]) Visit Report for 01/17/2015 Arrival Information Details Patient Name: Michelle Cabrera, Michelle Cabrera. Date of Service: 01/17/2015 1:45 PM Medical Record Number: [id number] Patient Account Number: [id number] Date of Birth/Sex: 1945-09-23 (69 y.o. Female) Treating RN: Curtis Sites Primary Care Physician: Einar Crow Other Clinician: Referring Physician: Einar Crow Treating Physician/Extender: Rudene Re in Treatment: 8 Visit Information History Since Last Visit Added or deleted any medications: No Patient Arrived: Ambulatory Any new allergies or adverse reactions: No Arrival Time: 14:09 Had a fall or experienced change in No Accompanied By: self activities of daily living that may affect Transfer Assistance: None risk of falls: Patient Identification Verified: Yes Signs or symptoms of abuse/neglect since last No Secondary Verification Process Yes visito Completed: Hospitalized since last visit: No Patient Has Alerts: Yes Pain Present Now: No Electronic Signature(s) Signed: 01/17/2015 5:20:40 PM By: Curtis Sites Entered By: Curtis Sites on 01/17/2015 14:09:54 Michelle Cabrera ([id number]) -------------------------------------------------------------------------------- Encounter Discharge Information Details Patient Name: Michelle Cabrera Date of Service: 01/17/2015 1:45 PM Medical Record Number: [id number] Patient Account Number: [id number] Date of Birth/Sex: [birth year] (69 y.o. Female) Treating RN: Primary Care Physician: Einar Crow Other Clinician: Referring Physician: Einar Crow Treating Physician/Extender: Rudene Re in Treatment: 8 Encounter Discharge Information Items Discharge Pain Level: 0 Discharge Condition: Stable Ambulatory Status: Ambulatory Discharge Destination: Home Transportation: Private Auto Accompanied By: self Schedule Follow-up Appointment: Yes Medication Reconciliation  completed and provided to Patient/Care No Dannica Bickham: Provided on Clinical Summary of Care: 01/17/2015 Form Type Recipient Paper Patient TO Electronic Signature(s) Signed: 01/17/2015 5:16:56 PM By: Curtis Sites Previous Signature: 01/17/2015 2:43:13 PM Version By: Gwenlyn Perking Entered By: Curtis Sites on 01/17/2015 17:16:56 Michelle Cabrera ([id number]) -------------------------------------------------------------------------------- Lower Extremity Assessment Details Patient Name: Michelle Cabrera Date of Service: 01/17/2015 1:45 PM Medical Record Number: [id number] Patient Account Number: [id number] Date of Birth/Sex: 03-12-1946 (69 y.o. Female) Treating RN: Curtis Sites Primary Care Physician: Einar Crow Other Clinician: Referring Physician: Einar Crow Treating Physician/Extender: Rudene Re in Treatment: 8 Edema Assessment Assessed: [Left: No] [Right: No] E[Left: dema] [Right: :] Calf Left: Right: Point of Measurement: 33 cm From Medial Instep cm 33.5 cm Ankle Left: Right: Point of Measurement: 10 cm From Medial Instep cm 19.2 cm Vascular Assessment Pulses: Posterior Tibial Palpable: [Right:Yes] Dorsalis Pedis Palpable: [Right:Yes] Extremity colors, hair growth, and conditions: Extremity Color: [Right:Hyperpigmented] Hair Growth on Extremity: [Right:Yes] Temperature of Extremity: [Right:Warm] Capillary Refill: [Right:< 3 seconds] Electronic Signature(s) Signed: 01/17/2015 5:20:40 PM By: Curtis Sites Entered By: Curtis Sites on 01/17/2015 14:18:00 Michelle Cabrera ([id number]) -------------------------------------------------------------------------------- Multi Wound Chart Details Patient Name: Michelle Cabrera Date of Service: 01/17/2015 1:45 PM Medical Record Number: [id number] Patient Account Number: [id number] Date of Birth/Sex: 11-26-1945 (69 y.o. Female) Treating RN: Curtis Sites Primary Care Physician: Einar Crow Other Clinician: Referring Physician: Einar Crow Treating Physician/Extender: Rudene Re in Treatment: 8 Vital Signs Height(in): 62 Pulse(bpm): 93 Weight(lbs): 151 Blood Pressure 155/92 (mmHg): Body Mass Index(BMI): 28 Temperature(F): 98.3 Respiratory Rate 18 (breaths/min): Photos: [1:No Photos] [2:No Photos] [N/A:N/A] Wound Location: [1:Right Lower Leg - Medial Right Elbow] [N/A:N/A] Wounding Event: [1:Trauma] [2:Trauma] [N/A:N/A] Primary Etiology: [1:Trauma, Other] [2:Skin Tear] [N/A:N/A] Comorbid History: [1:Chronic Obstructive Pulmonary Disease (COPD), Lupus Erythematosus, Rheumatoid Arthritis, Osteoarthritis, Neuropathy] [2:N/A] [N/A:N/A] Date Acquired: [1:11/03/2014] [2:12/14/2014] [N/A:N/A] Weeks of Treatment: [1:8] [2:1] [N/A:N/A] Wound Status: [1:Open] [2:Healed - Epithelialized] [N/A:N/A] Measurements L x W x D 0.8x1x0.2 [2:0x0x0] [N/A:N/A] (cm) Area (cm) : [1:0.628] [2:0] [N/A:N/A] Volume (cm) : [1:0.126] [2:0] [N/A:N/A] % Reduction  in Area: [1:94.70%] [2:100.00%] [N/A:N/A] % Reduction in Volume: 89.40% [2:100.00%] [N/A:N/A] Classification: [1:Full Thickness Without Exposed Support Structures] [2:Full Thickness Without Exposed Support Structures] [N/A:N/A] Exudate Amount: [1:Small] [2:N/A] [N/A:N/A] Exudate Type: [1:Serous] [2:N/A] [N/A:N/A] Exudate Color: [1:amber] [2:N/A] [N/A:N/A] Wound Margin: [1:Flat and Intact] [2:N/A] [N/A:N/A] Granulation Amount: [1:Large (67-100%)] [2:N/A] [N/A:N/A] Granulation Quality: [1:Pink] [2:N/A] [N/A:N/A] Necrotic Amount: [1:Small (1-33%)] [2:N/A] [N/A:N/A] Exposed Structures: [2:N/A] [N/A:N/A] Fascia: No Fat: No Tendon: No Muscle: No Joint: No Bone: No Limited to Skin Breakdown Epithelialization: None N/A N/A Debridement: Open Wound/Selective N/A N/A ([id number]) - Selective Time-Out Taken: Yes N/A N/A Pain Control: Lidocaine 4% Topical N/A N/A Solution Level: Non-Viable Tissue N/A  N/A Debridement Area (sq 0.8 N/A N/A cm): Instrument: Forceps, Other(gauze and N/A N/A saline) Bleeding: None N/A N/A Procedural Pain: 0 N/A N/A Post Procedural Pain: 0 N/A N/A Debridement Treatment Procedure was tolerated N/A N/A Response: well Periwound Skin Texture: Edema: Yes No Abnormalities Noted N/A Excoriation: No Induration: No Callus: No Crepitus: No Fluctuance: No Friable: No Rash: No Scarring: No Periwound Skin Moist: Yes No Abnormalities Noted N/A Moisture: Maceration: No Dry/Scaly: No Periwound Skin Color: Atrophie Blanche: No No Abnormalities Noted N/A Cyanosis: No Ecchymosis: No Erythema: No Hemosiderin Staining: No Mottled: No Pallor: No Rubor: No Temperature: No Abnormality N/A N/A Tenderness on Yes No N/A Palpation: Wound Preparation: Ulcer Cleansing: N/A N/A Rinsed/Irrigated with Saline Nakagawa, Bobbijo H. ([id number]) Topical Anesthetic Applied: Other: lidocaine 4% Procedures Performed: Debridement N/A N/A Treatment Notes Electronic Signature(s) Signed: 01/17/2015 5:20:40 PM By: Curtis Sites Entered By: Curtis Sites on 01/17/2015 14:33:15 Michelle Cabrera ([id number]) -------------------------------------------------------------------------------- Multi-Disciplinary Care Plan Details Patient Name: Michelle Cabrera Date of Service: 01/17/2015 1:45 PM Medical Record Number: [id number] Patient Account Number: [id number] Date of Birth/Sex: 1945-09-06 (69 y.o. Female) Treating RN: Curtis Sites Primary Care Physician: Einar Crow Other Clinician: Referring Physician: Einar Crow Treating Physician/Extender: Rudene Re in Treatment: 8 Active Inactive Orientation to the Wound Care Program Nursing Diagnoses: Knowledge deficit related to the wound healing center program Goals: Patient/caregiver will verbalize understanding of the Wound Healing Center Program Date Initiated: 11/18/2014 Goal Status:  Active Interventions: Provide education on orientation to the wound center Notes: Wound/Skin Impairment Nursing Diagnoses: Impaired tissue integrity Goals: Ulcer/skin breakdown will heal within 14 weeks Date Initiated: 11/18/2014 Goal Status: Active Interventions: Assess patient/caregiver ability to obtain necessary supplies Notes: Electronic Signature(s) Signed: 01/17/2015 5:20:40 PM By: Curtis Sites Entered By: Curtis Sites on 01/17/2015 14:33:08 Michelle Cabrera ([id number]) -------------------------------------------------------------------------------- Patient/Caregiver Education Details Patient Name: Michelle Cabrera Date of Service: 01/17/2015 1:45 PM Medical Record Number: [id number] Patient Account Number: [id number] Date of Birth/Gender: 1945/08/12 (69 y.o. Female) Treating RN: Curtis Sites Primary Care Physician: Einar Crow Other Clinician: Referring Physician: Einar Crow Treating Physician/Extender: Rudene Re in Treatment: 8 Education Assessment Education Provided To: Patient Education Topics Provided Wound/Skin Impairment: Handouts: Other: wound care using prisma Methods: Demonstration, Explain/Verbal Responses: State content correctly Electronic Signature(s) Signed: 01/17/2015 5:17:13 PM By: Curtis Sites Entered By: Curtis Sites on 01/17/2015 17:17:13 Michelle Cabrera ([id number]) -------------------------------------------------------------------------------- Wound Assessment Details Patient Name: Michelle Cabrera Date of Service: 01/17/2015 1:45 PM Medical Record Number: [id number] Patient Account Number: [id number] Date of Birth/Sex: May 20, 1946 (69 y.o. Female) Treating RN: Curtis Sites Primary Care Physician: Einar Crow Other Clinician: Referring Physician: Einar Crow Treating Physician/Extender: Rudene Re in Treatment: 8 Wound Status Wound Number: 1 Primary Trauma,  Other Etiology: Wound Location: Right Lower Leg - Medial Wound Open Wounding Event: Trauma Status: Date Acquired:  11/03/2014 Comorbid Chronic Obstructive Pulmonary Disease Weeks Of Treatment: 8 History: (COPD), Lupus Erythematosus, Clustered Wound: No Rheumatoid Arthritis, Osteoarthritis, Neuropathy Photos Photo Uploaded By: Curtis Sites on 01/17/2015 17:20:25 Wound Measurements Length: (cm) 0.8 Width: (cm) 1 Depth: (cm) 0.2 Area: (cm) 0.628 Volume: (cm) 0.126 % Reduction in Area: 94.7% % Reduction in Volume: 89.4% Epithelialization: None Tunneling: No Undermining: No Wound Description Full Thickness Without Exposed Classification: Support Structures Wound Margin: Flat and Intact Exudate Small Amount: Exudate Type: Serous Exudate Color: amber Foul Odor After Cleansing: No Wound Bed Granulation Amount: Large (67-100%) Exposed Structure TASHARI, WOODKE. ([id number]) Granulation Quality: Pink Fascia Exposed: No Necrotic Amount: Small (1-33%) Fat Layer Exposed: No Necrotic Quality: Adherent Slough Tendon Exposed: No Muscle Exposed: No Joint Exposed: No Bone Exposed: No Limited to Skin Breakdown Periwound Skin Texture Texture Color No Abnormalities Noted: No No Abnormalities Noted: No Callus: No Atrophie Blanche: No Crepitus: No Cyanosis: No Excoriation: No Ecchymosis: No Fluctuance: No Erythema: No Friable: No Hemosiderin Staining: No Induration: No Mottled: No Localized Edema: Yes Pallor: No Rash: No Rubor: No Scarring: No Temperature / Pain Moisture Temperature: No Abnormality No Abnormalities Noted: No Tenderness on Palpation: Yes Dry / Scaly: No Maceration: No Moist: Yes Wound Preparation Ulcer Cleansing: Rinsed/Irrigated with Saline Topical Anesthetic Applied: Other: lidocaine 4%, Treatment Notes Wound #1 (Right, Medial Lower Leg) 1. Cleansed with: Clean wound with Normal Saline 2. Anesthetic Topical Lidocaine 4% cream to  wound bed prior to debridement 4. Dressing Applied: Prisma Ag 5. Secondary Dressing Applied Non-Adherent pad 7. Secured with Magazine features editor) Signed: 01/17/2015 5:20:40 PM By: Curtis Sites Entered By: Curtis Sites on 01/17/2015 14:16:37 Michelle Cabrera ([id number] Cabrera ([id number]) -------------------------------------------------------------------------------- Wound Assessment Details Patient Name: Michelle Cabrera Date of Service: 01/17/2015 1:45 PM Medical Record Number: [id number] Patient Account Number: [id number] Date of Birth/Sex: 1945/11/19 (69 y.o. Female) Treating RN: Curtis Sites Primary Care Physician: Einar Crow Other Clinician: Referring Physician: Einar Crow Treating Physician/Extender: Rudene Re in Treatment: 8 Wound Status Wound Number: 2 Primary Etiology: Skin Tear Wound Location: Right Elbow Wound Status: Healed - Epithelialized Wounding Event: Trauma Date Acquired: 12/14/2014 Weeks Of Treatment: 1 Clustered Wound: No Photos Photo Uploaded By: Curtis Sites on 01/17/2015 17:20:26 Wound Measurements Length: (cm) 0 % Reducti Width: (cm) 0 % Reducti Depth: (cm) 0 Area: (cm) 0 Volume: (cm) 0 on in Area: 100% on in Volume: 100% Wound Description Full Thickness Without Exposed Classification: Support Structures Periwound Skin Texture Texture Color No Abnormalities Noted: No No Abnormalities Noted: No Moisture No Abnormalities Noted: No Electronic Signature(s) Signed: 01/17/2015 5:20:40 PM By: Whitman Hero ([id number]) Entered By: Curtis Sites on 01/17/2015 14:16:16 Michelle Cabrera ([id number]) -------------------------------------------------------------------------------- Vitals Details Patient Name: Michelle Cabrera Date of Service: 01/17/2015 1:45 PM Medical Record Number: [id number] Patient Account Number: [id number] Date of Birth/Sex: 02-14-1946  (69 y.o. Female) Treating RN: Curtis Sites Primary Care Physician: Einar Crow Other Clinician: Referring Physician: Einar Crow Treating Physician/Extender: Rudene Re in Treatment: 8 Vital Signs Time Taken: 14:12 Temperature (F): 98.3 Height (in): 62 Pulse (bpm): 93 Weight (lbs): 151 Respiratory Rate (breaths/min): 18 Body Mass Index (BMI): 27.6 Blood Pressure (mmHg): 155/92 Reference Range: 80 - 120 mg / dl Electronic Signature(s) Signed: 01/17/2015 5:20:40 PM By: Curtis Sites Entered By: Curtis Sites on 01/17/2015 14:13:04

## 2015-01-19 NOTE — Progress Notes (Signed)
[name], [name] ([id number]) Visit Report for 01/17/2015 Chief Complaint Document Details Patient Name: Michelle Cabrera, Michelle Cabrera. Date of Service: 01/17/2015 1:45 PM Medical Record Number: [id number] Patient Account Number: [id number] Date of Birth/Sex: [birth year] (69 y.o. Female) Treating RN: Primary Care Physician: Einar Crow Other Clinician: Referring Physician: Einar Crow Treating Physician/Extender: Rudene Re in Treatment: 8 Information Obtained from: Patient Chief Complaint Patient presents Michelle the wound care center for a consult due non healing wound Pleasant 69 year old patient who comes with a history of having an injury Michelle her right lower extremity approximately 2 weeks ago. She hit a briar bush and injured this area. 01/09/2015 -- she had a laceration on her right elbow region by hitting the door a couple of weeks ago and now wants [place] Michelle take a look at it. Electronic Signature(s) Signed: 01/17/2015 4:17:08 PM By: Evlyn Kanner MD, FACS Entered By: Evlyn Kanner on 01/17/2015 14:37:33 Michelle Cabrera ([id number]) -------------------------------------------------------------------------------- Debridement Details Patient Name: Michelle Cabrera Date of Service: 01/17/2015 1:45 PM Medical Record Number: [id number] Patient Account Number: [id number] Date of Birth/Sex: 10-31-1945 (69 y.o. Female) Treating RN: Primary Care Physician: Einar Crow Other Clinician: Referring Physician: Einar Crow Treating Physician/Extender: Rudene Re in Treatment: 8 Debridement Performed for Wound #1 Right,Medial Lower Leg Assessment: Performed By: Physician Tristan Schroeder., MD Debridement: Open Wound/Selective Debridement Selective Description: Pre-procedure Yes Verification/Time Out Taken: Start Time: 14:30 Pain Control: Lidocaine 4% Topical Solution Level: Non-Viable Tissue Total Area Debrided (L x 0.8 (cm) x 1 (cm) = 0.8  (cm) W): Tissue and other Non-Viable, Eschar, Fibrin/Slough material debrided: Instrument: Forceps, Other : gauze and saline Bleeding: None End Time: 14:32 Procedural Pain: 0 Post Procedural Pain: 0 Response Michelle Treatment: Procedure was tolerated well Post Debridement Measurements of Total Wound Length: (cm) 0.8 Width: (cm) 1 Depth: (cm) 0.2 Volume: (cm) 0.126 Electronic Signature(s) Signed: 01/17/2015 4:17:08 PM By: Evlyn Kanner MD, FACS Entered By: Evlyn Kanner on 01/17/2015 14:37:22 Michelle Cabrera ([id number]) -------------------------------------------------------------------------------- HPI Details Patient Name: Michelle Cabrera Date of Service: 01/17/2015 1:45 PM Medical Record Number: [id number] Patient Account Number: [id number] Date of Birth/Sex: 27-May-1946 (69 y.o. Female) Treating RN: Primary Care Physician: Einar Crow Other Clinician: Referring Physician: Einar Crow Treating Physician/Extender: Rudene Re in Treatment: 8 History of Present Illness Location: right lower extremity Quality: Patient reports experiencing a dull pain Michelle affected area(s). Severity: Patient states wound (s) are getting better. Duration: Patient has had the wound for < 2 weeks prior Michelle presenting for treatment Timing: Pain in wound is Intermittent (comes and goes Context: The wound occurred when the patient was injured by a [employer] Modifying Factors: Other treatment(s) tried include: local care and doxycycline orally. Associated Signs and Symptoms: Patient reports having difficulty standing for long periods. HPI Description: once she injured her leg she has been applying Neosporin and covering it with a bandage. She has been on doxycycline and has taken a course of his treatment and will finish her tablets today. Her comorbidities include osteoporosis, coronary syndrome, neuralgia, spinal stenosis, hypertension, rheumatoid arthritis, GERD,  COPD. Reviewed her list of her medications and note that she is on regular prednisone on a daily basis. She is also a smoker and smokes a pack of cigarettes daily and has been for about 56 years. She does not have diabetes. She has been treated with doxycycline for the last 2 weeks. 11/25/2014 -- the edema and a right lower extremity had gone up significantly and her primary care doctor has increased  her diuretic. she continues Michelle smoke about 12-15 cigarettes a day and is going Michelle start on nicotine patches tomorrow. She has completed her course of antibiotics. 12/02/2014 -- she says she's been taking her diuretics regularly and is swelling of the leg has gone down remarkably. Uses the nicotine patches now and her smoking has gone down Michelle a few cigarettes a day. Does complain of pain in her legs and calf and she thinks this may have something to do with her constant back problems and several back surgeries. 12/08/2014 -- the pain in the right lower extremity continued Michelle be there and she saw her PCP who got a DVT study done. This report dated 12/06/2014 and there was no evidence of DVT in the right lower extremity. She continues Michelle smoke however and has been not able Michelle give it up completely yet. 12/23/2014 - No new complaints today. Pain slightly improved. No fever or chills. 01/02/2015 -- continues to do well and has been having a few cigarettes a day. Using nicotine gum and other methods Michelle quit smoking. 01/09/2015 -- the area on her right elbow was injured due Michelle abrasion with a door and it has now opened out and become an ulcer. Michelle Cabrera, Michelle Cabrera ([id number]) Electronic Signature(s) Signed: 01/17/2015 4:17:08 PM By: Evlyn Kanner MD, FACS Entered By: Evlyn Kanner on 01/17/2015 14:37:40 Michelle Cabrera ([id number]) -------------------------------------------------------------------------------- Physical Exam Details Patient Name: Michelle Cabrera Date of Service: 01/17/2015 1:45  PM Medical Record Number: [id number] Patient Account Number: [id number] Date of Birth/Sex: 1946-07-04 (69 y.o. Female) Treating RN: Primary Care Physician: Einar Crow Other Clinician: Referring Physician: Einar Crow Treating Physician/Extender: Rudene Re in Treatment: 8 Constitutional . Pulse regular. Respirations normal and unlabored. Afebrile. . Eyes Nonicteric. Reactive Michelle light. Ears, Nose, Mouth, and Throat Lips, teeth, and gums WNL.Marland Kitchen Moist mucosa without lesions . Neck supple and nontender. No palpable supraclavicular or cervical adenopathy. Normal sized without goiter. Respiratory WNL. No retractions.. Cardiovascular Pedal Pulses WNL. No clubbing, cyanosis or edema. Musculoskeletal the wound on her right elbow has completely healed. Her left lower extremity has minimal slough on the lateral side and the wound has come down nicely in size.. Digits and nails w/o clubbing, cyanosis, infection, petechiae, ischemia, or inflammatory conditions.. Integumentary (Hair, Skin) No suspicious lesions. No crepitus or fluctuance. No peri-wound warmth or erythema. No masses.Marland Kitchen Psychiatric Judgement and insight Intact.. No evidence of depression, anxiety, or agitation.. Electronic Signature(s) Signed: 01/17/2015 4:17:08 PM By: Evlyn Kanner MD, FACS Entered By: Evlyn Kanner on 01/17/2015 14:38:21 Michelle Cabrera ([id number]) -------------------------------------------------------------------------------- Physician Orders Details Patient Name: Michelle Cabrera Date of Service: 01/17/2015 1:45 PM Medical Record Number: [id number] Patient Account Number: [id number] Date of Birth/Sex: May 19, 1946 (69 y.o. Female) Treating RN: Curtis Sites Primary Care Physician: Einar Crow Other Clinician: Referring Physician: Einar Crow Treating Physician/Extender: Rudene Re in Treatment: 8 Verbal / Phone Orders: No Diagnosis Coding Wound  Cleansing Wound #1 Right,Medial Lower Leg o Clean wound with Normal Saline. Anesthetic Wound #1 Right,Medial Lower Leg o Topical Lidocaine 4% cream applied Michelle wound bed prior Michelle debridement Skin Barriers/Peri-Wound Care Wound #1 Right,Medial Lower Leg o Skin Prep Primary Wound Dressing Wound #1 Right,Medial Lower Leg o Prisma Ag Secondary Dressing Wound #1 Right,Medial Lower Leg o Non-adherent pad Dressing Change Frequency Wound #1 Right,Medial Lower Leg o Change dressing every other day. Follow-up Appointments Wound #1 Right,Medial Lower Leg o Return Appointment in 1 week. Edema Control Wound #1 Right,Medial Lower Leg o Elevate legs Michelle  the level of the heart and pump ankles as often as possible o Tubigrip - double layer of Tubigrip Additional Orders / Instructions Michelle Cabrera, Michelle Cabrera ([id number]) Wound #1 Right,Medial Lower Leg o Stop Smoking Electronic Signature(s) Signed: 01/17/2015 4:17:08 PM By: Evlyn Kanner MD, FACS Signed: 01/17/2015 5:20:40 PM By: Curtis Sites Entered By: Curtis Sites on 01/17/2015 14:33:58 Michelle Cabrera ([id number]) -------------------------------------------------------------------------------- Problem List Details Patient Name: Michelle Cabrera Date of Service: 01/17/2015 1:45 PM Medical Record Number: [id number] Patient Account Number: [id number] Date of Birth/Sex: May 08, 1946 (69 y.o. 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 F17.218 Nicotine dependence, cigarettes, with other nicotine- 11/18/2014 Yes induced  disorders S41.111A Laceration without foreign body of right upper arm, initial 01/09/2015 Yes encounter Inactive Problems Resolved Problems Electronic Signature(s) Signed: 01/17/2015 4:17:08 PM By: Evlyn Kanner MD, FACS Entered By: Evlyn Kanner on 01/17/2015 14:36:56 Michelle Cabrera ([id number]) -------------------------------------------------------------------------------- Progress Note Details Patient Name: Michelle Cabrera Date of Service: 01/17/2015 1:45 PM Medical Record Number: [id number] Patient Account Number: [id number] Date of Birth/Sex: 1945/10/24 (69 y.o. Female) Treating RN: Primary Care Physician: Einar Crow Other Clinician: Referring Physician: Einar Crow Treating Physician/Extender: Rudene Re in Treatment: 8 Subjective Chief Complaint Information obtained from Patient Patient presents Michelle the wound care center for a consult due non healing wound Pleasant 69 year old patient who comes with a history of having an injury Michelle her right lower extremity approximately 2 weeks ago. She hit a briar bush and injured this area. 01/09/2015 -- she had a laceration on her right elbow region by hitting the door a couple of weeks ago and now wants [place] Michelle take a look at it. History of Present Illness (HPI) The following HPI elements were documented for the patient's wound: Location: right lower extremity Quality: Patient reports experiencing a dull pain Michelle affected area(s). Severity: Patient states wound (s) are getting better. Duration: Patient has had the wound for < 2 weeks prior Michelle presenting for treatment Timing: Pain in wound is Intermittent (comes and goes Context: The wound occurred when the patient was injured by a [employer] Modifying Factors: Other treatment(s) tried include: local care and doxycycline orally. Associated Signs and Symptoms: Patient reports having difficulty standing for long periods. once she injured her leg she has been  applying Neosporin and covering it with a bandage. She has been on doxycycline and has taken a course of his treatment and will finish her tablets today. Her comorbidities include osteoporosis, coronary syndrome, neuralgia, spinal stenosis, hypertension, rheumatoid arthritis, GERD, COPD. Reviewed her list of her medications and note that she is on regular prednisone on a daily basis. She is also a smoker and smokes a pack of cigarettes daily and has been for about 56 years. She does not have diabetes. She has been treated with doxycycline for the last 2 weeks. 11/25/2014 -- the edema and a right lower extremity had gone up significantly and her primary care doctor has increased her diuretic. she continues Michelle smoke about 12-15 cigarettes a day and is going Michelle start on nicotine patches tomorrow. She has completed her course of antibiotics. 12/02/2014 -- she says she's been taking her diuretics regularly and is swelling of the leg has gone down remarkably. Uses  the nicotine patches now and her smoking has gone down Michelle a few cigarettes a day. Does complain of pain in her legs and calf and she thinks this may have something to do with her constant back problems and several back surgeries. 12/08/2014 -- the pain in the right lower extremity continued Michelle be there and she saw her PCP who got a DVT study done. This report dated 12/06/2014 and there was no evidence of DVT in the right lower Michelle Cabrera, Michelle H. ([id number]) extremity. She continues Michelle smoke however and has been not able Michelle give it up completely yet. 12/23/2014 - No new complaints today. Pain slightly improved. No fever or chills. 01/02/2015 -- continues to do well and has been having a few cigarettes a day. Using nicotine gum and other methods Michelle quit smoking. 01/09/2015 -- the area on her right elbow was injured due Michelle abrasion with a door and it has now opened out and become an ulcer. Objective Constitutional Pulse regular.  Respirations normal and unlabored. Afebrile. Vitals Time Taken: 2:12 PM, Height: 62 in, Weight: 151 lbs, BMI: 27.6, Temperature: 98.3 F, Pulse: 93 bpm, Respiratory Rate: 18 breaths/min, Blood Pressure: 155/92 mmHg. Eyes Nonicteric. Reactive Michelle light. Ears, Nose, Mouth, and Throat Lips, teeth, and gums WNL.Marland Kitchen Moist mucosa without lesions . Neck supple and nontender. No palpable supraclavicular or cervical adenopathy. Normal sized without goiter. Respiratory WNL. No retractions.. Cardiovascular Pedal Pulses WNL. No clubbing, cyanosis or edema. Musculoskeletal the wound on her right elbow has completely healed. Her left lower extremity has minimal slough on the lateral side and the wound has come down nicely in size.. Digits and nails w/o clubbing, cyanosis, infection, petechiae, ischemia, or inflammatory conditions.Marland Kitchen Psychiatric Judgement and insight Intact.. No evidence of depression, anxiety, or agitation.Marland Kitchen Michelle Cabrera, Michelle Cabrera ([id number]) Integumentary (Hair, Skin) No suspicious lesions. No crepitus or fluctuance. No peri-wound warmth or erythema. No masses.. Wound #1 status is Open. Original cause of wound was Trauma. The wound is located on the Right,Medial Lower Leg. The wound measures 0.8cm length x 1cm width x 0.2cm depth; 0.628cm^2 area and 0.126cm^3 volume. The wound is limited Michelle skin breakdown. There is no tunneling or undermining noted. There is a small amount of serous drainage noted. The wound margin is flat and intact. There is large (67- 100%) pink granulation within the wound bed. There is a small (1-33%) amount of necrotic tissue within the wound bed including Adherent Slough. The periwound skin appearance exhibited: Localized Edema, Moist. The periwound skin appearance did not exhibit: Callus, Crepitus, Excoriation, Fluctuance, Friable, Induration, Rash, Scarring, Dry/Scaly, Maceration, Atrophie Blanche, Cyanosis, Ecchymosis, Hemosiderin Staining, Mottled, Pallor,  Rubor, Erythema. Periwound temperature was noted as No Abnormality. The periwound has tenderness on palpation. Wound #2 status is Healed - Epithelialized. Original cause of wound was Trauma. The wound is located on the Right Elbow. The wound measures 0cm length x 0cm width x 0cm depth; 0cm^2 area and 0cm^3 volume. 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 - Non-pressure chronic ulcer of right calf with fat layer exposed F17.218 - Nicotine dependence, cigarettes, with other nicotine-induced disorders S41.111A - Laceration without foreign body of right upper arm, initial encounter Diagnoses ICD-10 S81.811A: Laceration without foreign body, right lower leg, initial encounter L97.212: Non-pressure chronic ulcer of right calf with fat layer exposed F17.218: Nicotine dependence, cigarettes, with other nicotine-induced disorders S41.111A: Laceration without foreign body of right upper arm, initial encounter After sharply debriding all the fibrin  and slough the wound is looking clean and I will change her over [employer] and I have urged her again Michelle quit smoking. She will come back and see me next week. Procedures Wound #1 Michelle Cabrera, Michelle Cabrera ([id number]) Wound #1 is a Trauma, Other located on the Right,Medial Lower Leg . There was a Non-Viable Tissue Open Wound/Selective [phone] debridement with total area of 0.8 sq cm performed by Wendolyn Raso, Ignacia Felling., MD. with the following instrument(s): Forceps and gauze and saline Michelle remove Non-Viable tissue/material including Fibrin/Slough and Eschar after achieving pain control using Lidocaine 4% Topical Solution. A time out was conducted prior Michelle the start of the procedure. There was no bleeding. The procedure was tolerated well with a pain level of 0 throughout and a pain level of 0 following the procedure. Post Debridement Measurements: 0.8cm length x 1cm width x  0.2cm depth; 0.126cm^3 volume. Plan Wound Cleansing: Wound #1 Right,Medial Lower Leg: Clean wound with Normal Saline. Anesthetic: Wound #1 Right,Medial Lower Leg: Topical Lidocaine 4% cream applied Michelle wound bed prior Michelle debridement Skin Barriers/Peri-Wound Care: Wound #1 Right,Medial Lower Leg: Skin Prep Primary Wound Dressing: Wound #1 Right,Medial Lower Leg: Prisma Ag Secondary Dressing: Wound #1 Right,Medial Lower Leg: Non-adherent pad Dressing Change Frequency: Wound #1 Right,Medial Lower Leg: Change dressing every other day. Follow-up Appointments: Wound #1 Right,Medial Lower Leg: Return Appointment in 1 week. Edema Control: Wound #1 Right,Medial Lower Leg: Elevate legs Michelle the level of the heart and pump ankles as often as possible Tubigrip - double layer of Tubigrip Additional Orders / Instructions: Wound #1 Right,Medial Lower Leg: Stop Smoking Follow-Up Appointments: A Patient Clinical Summary of Care was provided Michelle Michelle Cabrera, Michelle Cabrera ([id number]) After sharply debriding all the fibrin and slough the wound is looking clean and I will change her over Michelle [employer] and I have urged her again Michelle quit smoking. She will come back and see me next week. Electronic Signature(s) Signed: 01/17/2015 5:15:26 PM By: Renee Harder RN Signed: 01/18/2015 5:31:33 PM By: Evlyn Kanner MD, FACS Previous Signature: 01/17/2015 4:17:08 PM Version By: Evlyn Kanner MD, FACS Entered By: Renee Harder on 01/17/2015 17:15:26 Michelle Cabrera ([id number]) -------------------------------------------------------------------------------- SuperBill Details Patient Name: Michelle Cabrera Date of Service: 01/17/2015 Medical Record Number: [id number] Patient Account Number: [id number] Date of Birth/Sex: 19-Aug-1945 (69 y.o. Female) Treating RN: Primary Care Physician: Einar Crow Other Clinician: Referring Physician: Einar Crow Treating Physician/Extender: Rudene Re in Treatment: 8 Diagnosis Coding ICD-10 Codes Code Description [phone] Laceration without foreign body, right lower leg, initial encounter L97.212 Non-pressure chronic ulcer of right calf with fat layer exposed F17.218 Nicotine dependence, cigarettes, with other nicotine-induced disorders S41.111A Laceration without foreign body of right upper arm, initial encounter Facility Procedures CPT4 Code Description: [id number] [phone] - DEBRIDE WOUND 1ST 20 SQ CM OR < ICD-10 Description Diagnosis S81.811A Laceration without foreign body, right lower leg, initi L97.212 Non-pressure chronic ulcer of right calf with fat layer F17.218 Nicotine  dependence, cigarettes, with other nicotine-in Modifier: al encounte exposed duced disor Quantity: 1 r ders Physician Procedures CPT4 Code Description: [id number] - WC PHYS DEBR WO ANESTH 20 SQ CM ICD-10 Description Diagnosis S81.811A Laceration without foreign body, right lower leg, initi L97.212 Non-pressure chronic ulcer of right calf with fat layer F17.218 Nicotine  dependence, cigarettes, with other nicotine-in Modifier: al encounte exposed duced disor Quantity: 1 r ders Electronic Signature(s) Signed: 01/17/2015 4:17:08 PM By: Evlyn Kanner MD, FACS Entered By: Evlyn Kanner on 01/17/2015 14:39:45

## 2015-01-24 ENCOUNTER — Encounter: Payer: Medicare Other | Admitting: Surgery

## 2015-01-24 DIAGNOSIS — L97212 Non-pressure chronic ulcer of right calf with fat layer exposed: Secondary | ICD-10-CM | POA: Diagnosis not present

## 2015-01-25 NOTE — Progress Notes (Signed)
Michelle Cabrera, Michelle Cabrera ([id number]) Visit Report for 01/24/2015 Chief Complaint Document Details Patient Name: Michelle Cabrera, HEIT. Date of Service: 01/24/2015 9:00 AM Medical Record Number: [id number] Patient Account Number: [id number] Date of Birth/Sex: 1945/10/30 (69 y.o. Female) Treating RN: Primary Care Physician: Einar Crow Other Clinician: Referring Physician: Einar Crow Treating Physician/Extender: Rudene Re in Treatment: 9 Information Obtained from: Patient Chief Complaint Patient presents to the wound care center for a consult due non healing wound Pleasant 69 year old patient who comes with a history of having an injury to her right lower extremity approximately 2 weeks ago. She hit a briar bush and injured this area. 01/09/2015 -- she had a laceration on her right elbow region by hitting the door a couple of weeks ago and now wants [place] to take a look at it. Electronic Signature(s) Signed: 01/24/2015 12:30:49 PM By: Evlyn Kanner MD, FACS Entered By: Evlyn Kanner on 01/24/2015 09:38:49 Michelle Cabrera ([id number]) -------------------------------------------------------------------------------- HPI Details Patient Name: Michelle Cabrera Date of Service: 01/24/2015 9:00 AM Medical Record Number: [id number] Patient Account Number: [id number] Date of Birth/Sex: December 29, 1945 (69 y.o. Female) Treating RN: Primary Care Physician: Einar Crow Other Clinician: Referring Physician: Einar Crow Treating Physician/Extender: Rudene Re in Treatment: 9 History of Present Illness Location: right lower extremity Quality: Patient reports experiencing a dull pain to affected area(s). Severity: Patient states wound (s) are getting better. Duration: Patient has had the wound for < 2 weeks prior to presenting for treatment Timing: Pain in wound is Intermittent (comes and goes Context: The wound occurred when the patient was injured by a [employer]. Modifying Factors: Other treatment(s) tried include: local care and doxycycline orally. Associated Signs and Symptoms: Patient reports having difficulty standing for long periods. HPI Description: once she injured her leg she has been applying Neosporin and covering it with a bandage. She has been on doxycycline and has taken a course of his treatment and will finish her tablets today. Her comorbidities include osteoporosis, coronary syndrome, neuralgia, spinal stenosis, hypertension, rheumatoid arthritis, GERD, COPD. Reviewed her list of her medications and note that she is on regular prednisone on a daily basis. She is also a smoker and smokes a pack of cigarettes daily and has been for about 56 years. She does not have diabetes. She has been treated with doxycycline for the last 2 weeks. 11/25/2014 -- the edema and a right lower extremity had gone up significantly and her primary care doctor has increased her diuretic. she continues to smoke about 12-15 cigarettes a day and is going to start on nicotine patches tomorrow. She has completed her course of antibiotics. 12/02/2014 -- she says she's been taking her diuretics regularly and is swelling of the leg has gone down remarkably. Uses the nicotine patches now and her smoking has gone down to a few cigarettes a day. Does complain of pain in her legs and calf and she thinks this may have something to do with her constant back problems and several back surgeries. 12/08/2014 -- the pain in the right lower extremity continued to be there and she saw her PCP who got a DVT study done. This report dated 12/06/2014 and there was no evidence of DVT in the right lower extremity. She continues to smoke however and has been not able to give it up completely yet. 12/23/2014 - No new complaints today. Pain slightly improved. No fever or chills. 01/02/2015 -- continues to do well and has been having a few cigarettes a day. Using nicotine gum  and other methods to quit smoking. 01/09/2015 -- the area on her right elbow was injured due to abrasion with a door and it has now opened out and become an ulcer. Michelle Cabrera, Michelle Cabrera ([id number]) 01/24/2015 -- he continues to smoke and is not going to be able to come next week because she is away for vacation. She also lost the [facility] material and hence has been using Hydrofera Blue. Electronic Signature(s) Signed: 01/24/2015 12:30:49 PM By: Evlyn Kanner MD, FACS Entered By: Evlyn Kanner on 01/24/2015 09:39:31 Michelle Cabrera ([id number]) -------------------------------------------------------------------------------- Physical Exam Details Patient Name: Michelle Cabrera Date of Service: 01/24/2015 9:00 AM Medical Record Number: [id number] Patient Account Number: [id number] Date of Birth/Sex: 27-Jul-1946 (69 y.o. Female) Treating RN: Primary Care Physician: Einar Crow Other Clinician: Referring Physician: Einar Crow Treating Physician/Extender: Rudene Re in Treatment: 9 Constitutional . Pulse regular. Respirations normal and unlabored. Afebrile. . Eyes Nonicteric. Reactive to light. Ears, Nose, Mouth, and Throat Lips, teeth, and gums WNL.Marland Kitchen Moist mucosa without lesions . Neck supple and nontender. No palpable supraclavicular or cervical adenopathy. Normal sized without goiter. Respiratory WNL. No retractions.. Cardiovascular Pedal Pulses WNL. No clubbing, cyanosis or edema. Musculoskeletal Adexa without tenderness or enlargement.. Digits and nails w/o clubbing, cyanosis, infection, petechiae, ischemia, or inflammatory conditions.. Integumentary (Hair, Skin) the wound looks clean and granulating well and surrounding skin is healthy.. No crepitus or fluctuance. No peri-wound warmth or erythema. No masses.Marland Kitchen Psychiatric Judgement and insight Intact.. No evidence of depression, anxiety, or agitation.. Electronic Signature(s) Signed: 01/24/2015 12:30:49 PM  By: Evlyn Kanner MD, FACS Entered By: Evlyn Kanner on 01/24/2015 09:40:02 Michelle Cabrera ([id number]) -------------------------------------------------------------------------------- Physician Orders Details Patient Name: Michelle Cabrera Date of Service: 01/24/2015 9:00 AM Medical Record Number: [id number] Patient Account Number: [id number] Date of Birth/Sex: [birth year] (69 y.o. Female) Treating RN: Curtis Sites Primary Care Physician: Einar Crow Other Clinician: Referring Physician: Einar Crow Treating Physician/Extender: Rudene Re in Treatment: 9 Verbal / Phone Orders: Yes Clinician: Curtis Sites Read Back and Verified: Yes Diagnosis Coding Wound Cleansing Wound #1 Right,Medial Lower Leg o Clean wound with Normal Saline. Anesthetic Wound #1 Right,Medial Lower Leg o Topical Lidocaine 4% cream applied to wound bed prior to debridement Skin Barriers/Peri-Wound Care Wound #1 Right,Medial Lower Leg o Skin Prep Primary Wound Dressing Wound #1 Right,Medial Lower Leg o Prisma Ag Secondary Dressing Wound #1 Right,Medial Lower Leg o Non-adherent pad Dressing Change Frequency Wound #1 Right,Medial Lower Leg o Change dressing every other day. Follow-up Appointments Wound #1 Right,Medial Lower Leg o Return Appointment in 2 weeks. - due to vacation Edema Control Wound #1 Right,Medial Lower Leg o Elevate legs to the level of the heart and pump ankles as often as possible Additional Orders / Instructions Wound #1 Right,Medial Lower Leg Michelle Cabrera, Michelle Cabrera ([id number]) o Stop Smoking Electronic Signature(s) Signed: 01/24/2015 12:30:49 PM By: Evlyn Kanner MD, FACS Signed: 01/24/2015 4:37:27 PM By: Curtis Sites Entered By: Curtis Sites on 01/24/2015 09:38:08 Michelle Cabrera ([id number]) -------------------------------------------------------------------------------- Problem List Details Patient Name: Michelle Cabrera Date  of Service: 01/24/2015 9:00 AM Medical Record Number: [id number] Patient Account Number: [id number] Date of Birth/Sex: Oct 03, 1945 (69 y.o. 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 F17.218 Nicotine dependence,  cigarettes, with other nicotine- 11/18/2014 Yes induced disorders S41.111A Laceration without foreign body of right upper arm, initial 01/09/2015 Yes encounter Inactive Problems Resolved Problems Electronic Signature(s) Signed: 01/24/2015 12:30:49 PM By: Evlyn Kanner MD, FACS Entered By: Evlyn Kanner on 01/24/2015 09:38:41 Michelle Cabrera ([id number]) -------------------------------------------------------------------------------- Progress Note Details Patient Name: Michelle Cabrera Date of Service: 01/24/2015 9:00 AM Medical Record Number: [id number] Patient Account Number: [id number] Date of Birth/Sex: 12/10/1945 (69 y.o. Female) Treating RN: Primary Care Physician: Einar Crow Other Clinician: Referring Physician: Einar Crow Treating Physician/Extender: Rudene Re in Treatment: 9 Subjective Chief Complaint Information obtained from Patient Patient presents to the wound care center for a consult due non healing wound Pleasant 69 year old patient who comes with a history of having an injury to her right lower extremity approximately 2 weeks ago. She hit a briar bush and injured this area. 01/09/2015 -- she had a laceration on her right elbow region by hitting the door a couple of weeks ago and now wants [place] to take a look at it. History of Present Illness  (HPI) The following HPI elements were documented for the patient's wound: Location: right lower extremity Quality: Patient reports experiencing a dull pain to affected area(s). Severity: Patient states wound (s) are getting better. Duration: Patient has had the wound for < 2 weeks prior to presenting for treatment Timing: Pain in wound is Intermittent (comes and goes Context: The wound occurred when the patient was injured by a [employer] Modifying Factors: Other treatment(s) tried include: local care and doxycycline orally. Associated Signs and Symptoms: Patient reports having difficulty standing for long periods. once she injured her leg she has been applying Neosporin and covering it with a bandage. She has been on doxycycline and has taken a course of his treatment and will finish her tablets today. Her comorbidities include osteoporosis, coronary syndrome, neuralgia, spinal stenosis, hypertension, rheumatoid arthritis, GERD, COPD. Reviewed her list of her medications and note that she is on regular prednisone on a daily basis. She is also a smoker and smokes a pack of cigarettes daily and has been for about 56 years. She does not have diabetes. She has been treated with doxycycline for the last 2 weeks. 11/25/2014 -- the edema and a right lower extremity had gone up significantly and her primary care doctor has increased her diuretic. she continues to smoke about 12-15 cigarettes a day and is going to start on nicotine patches tomorrow. She has completed her course of antibiotics. 12/02/2014 -- she says she's been taking her diuretics regularly and is swelling of the leg has gone down remarkably. Uses the nicotine patches now and her smoking has gone down to a few cigarettes a day. Does complain of pain in her legs and calf and she thinks this may have something to do with her constant back problems and several back surgeries. 12/08/2014 -- the pain in the right lower extremity  continued to be there and she saw her PCP who got a DVT study done. This report dated 12/06/2014 and there was no evidence of DVT in the right lower Michelle Cabrera, Michelle H. ([id number]) extremity. She continues to smoke however and has been not able to give it up completely yet. 12/23/2014 - No new complaints today. Pain slightly improved. No fever or chills. 01/02/2015 -- continues to do well and has been having a few cigarettes a day. Using nicotine gum and other methods to quit smoking. 01/09/2015 -- the area on her right elbow was injured due to  abrasion with a door and it has now opened out and become an ulcer. 01/24/2015 -- he continues to smoke and is not going to be able to come next week because she is away for vacation. She also lost the [facility] material and hence has been using Hydrofera Blue. Objective Constitutional Pulse regular. Respirations normal and unlabored. Afebrile. Vitals Time Taken: 9:22 AM, Height: 62 in, Weight: 151 lbs, BMI: 27.6, Temperature: 98.2 F, Pulse: 89 bpm, Respiratory Rate: 18 breaths/min, Blood Pressure: 133/87 mmHg. Eyes Nonicteric. Reactive to light. Ears, Nose, Mouth, and Throat Lips, teeth, and gums WNL.Marland Kitchen Moist mucosa without lesions . Neck supple and nontender. No palpable supraclavicular or cervical adenopathy. Normal sized without goiter. Respiratory WNL. No retractions.. Cardiovascular Pedal Pulses WNL. No clubbing, cyanosis or edema. Musculoskeletal Adexa without tenderness or enlargement.. Digits and nails w/o clubbing, cyanosis, infection, petechiae, ischemia, or inflammatory conditions.Marland Kitchen Psychiatric Judgement and insight Intact.. No evidence of depression, anxiety, or agitation.Marland Kitchen Michelle Cabrera, Michelle Cabrera ([id number]) Integumentary (Hair, Skin) the wound looks clean and granulating well and surrounding skin is healthy.. No crepitus or fluctuance. No peri-wound warmth or erythema. No masses.. Wound #1 status is Open. Original cause of wound was  Trauma. The wound is located on the Right,Medial Lower Leg. The wound measures 0.7cm length x 1cm width x 0.2cm depth; 0.55cm^2 area and 0.11cm^3 volume. The wound is limited to skin breakdown. There is no tunneling or undermining noted. There is a small amount of serous drainage noted. The wound margin is flat and intact. There is large (67-100%) pink granulation within the wound bed. There is a small (1-33%) amount of necrotic tissue within the wound bed including Adherent Slough. The periwound skin appearance exhibited: Localized Edema, Moist. The periwound skin appearance did not exhibit: Callus, Crepitus, Excoriation, Fluctuance, Friable, Induration, Rash, Scarring, Dry/Scaly, Maceration, Atrophie Blanche, Cyanosis, Ecchymosis, Hemosiderin Staining, Mottled, Pallor, Rubor, Erythema. Periwound temperature was noted as No Abnormality. The periwound has tenderness on palpation. 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 - Non-pressure chronic ulcer of right calf with fat layer exposed F17.218 - Nicotine dependence, cigarettes, with other nicotine-induced disorders S41.111A - Laceration without foreign body of right upper arm, initial encounter Procedures Continue to use Prisma AG on alternate days and urged to give up smoking again. As she is going to be away on vacation we will see her back in 2 weeks. Plan Wound Cleansing: Wound #1 Right,Medial Lower Leg: Clean wound with Normal Saline. Anesthetic: Wound #1 Right,Medial Lower Leg: Michelle Cabrera, Michelle Cabrera ([id number]) Topical Lidocaine 4% cream applied to wound bed prior to debridement Skin Barriers/Peri-Wound Care: Wound #1 Right,Medial Lower Leg: Skin Prep Primary Wound Dressing: Wound #1 Right,Medial Lower Leg: Prisma Ag Secondary Dressing: Wound #1 Right,Medial Lower Leg: Non-adherent pad Dressing Change Frequency: Wound #1 Right,Medial Lower  Leg: Change dressing every other day. Follow-up Appointments: Wound #1 Right,Medial Lower Leg: Return Appointment in 2 weeks. - due to vacation Edema Control: Wound #1 Right,Medial Lower Leg: Elevate legs to the level of the heart and pump ankles as often as possible Additional Orders / Instructions: Wound #1 Right,Medial Lower Leg: Stop Smoking Continue to use Prisma AG on alternate days and urged to give up smoking again. As she is going to be away on vacation we will see her back in 2 weeks. Electronic Signature(s) Signed: 01/24/2015 12:30:49 PM By: Evlyn Kanner MD, FACS Entered By: Evlyn Kanner on 01/24/2015 09:40:47 Michelle Cabrera ([id number]) -------------------------------------------------------------------------------- SuperBill Details Patient Name:  Michelle Cabrera, Michelle H. Date of Service: 01/24/2015 Medical Record Number: [id number] Patient Account Number: [id number] Date of Birth/Sex: 10-05-1945 (69 y.o. Female) Treating RN: Primary Care Physician: Einar Crow Other Clinician: Referring Physician: Einar Crow Treating Physician/Extender: Rudene Re in Treatment: 9 Diagnosis Coding ICD-10 Codes Code Description [phone] Laceration without foreign body, right lower leg, initial encounter L97.212 Non-pressure chronic ulcer of right calf with fat layer exposed F17.218 Nicotine dependence, cigarettes, with other nicotine-induced disorders S41.111A Laceration without foreign body of right upper arm, initial encounter Facility Procedures CPT4 Code: [id number] Description: 99213 - WOUND CARE VISIT-LEV 3 EST PT Modifier: Quantity: 1 Physician Procedures CPT4 Code Description: [id number] 99213 - WC PHYS LEVEL 3 - EST PT ICD-10 Description Diagnosis S81.811A Laceration without foreign body, right lower leg, initi F17.218 Nicotine dependence, cigarettes, with other nicotine-in L97.212 Non-pressure chronic  ulcer of right calf with fat layer Modifier: al  encounter duced disord exposed Quantity: 1 ers Electronic Signature(s) Signed: 01/24/2015 12:30:49 PM By: Evlyn Kanner MD, FACS Entered By: Evlyn Kanner on 01/24/2015 09:41:07

## 2015-01-25 NOTE — Progress Notes (Addendum)
Michelle Cabrera, Michelle Cabrera ([id number]) Visit Report for 01/24/2015 Arrival Information Details Patient Name: Michelle Cabrera, Michelle Cabrera. Date of Service: 01/24/2015 9:00 AM Medical Record Number: [id number] Patient Account Number: [id number] Date of Birth/Sex: Oct 21, 1945 (69 y.o. Female) Treating RN: Curtis Sites Primary Care Physician: Einar Crow Other Clinician: Referring Physician: Einar Crow Treating Physician/Extender: Rudene Re in Treatment: 9 Visit Information History Since Last Visit Added or deleted any medications: No Patient Arrived: Ambulatory Any new allergies or adverse reactions: No Arrival Time: 09:15 Had a fall or experienced change in No Accompanied By: self activities of daily living that may affect Transfer Assistance: None risk of falls: Patient Identification Verified: Yes Signs or symptoms of abuse/neglect since last No Secondary Verification Process Yes visito Completed: Hospitalized since last visit: No Patient Has Alerts: Yes Pain Present Now: No Electronic Signature(s) Signed: 01/24/2015 4:37:27 PM By: Curtis Sites Entered By: Curtis Sites on 01/24/2015 09:21:16 Michelle Cabrera ([id number]) -------------------------------------------------------------------------------- Clinic Level of Care Assessment Details Patient Name: Michelle Cabrera Date of Service: 01/24/2015 9:00 AM Medical Record Number: [id number] Patient Account Number: [id number] Date of Birth/Sex: [birth year] (69 y.o. Female) Treating RN: Curtis Sites Primary Care Physician: Einar Crow Other Clinician: Referring Physician: Einar Crow Treating Physician/Extender: Rudene Re in Treatment: 9 Clinic Level of Care Assessment Items TOOL 4 Quantity Score []  - Use when only an EandM is performed on FOLLOW-UP visit 0 ASSESSMENTS - Nursing Assessment / Reassessment X - Reassessment of Co-morbidities (includes updates in patient status) 1 10 X -  Reassessment of Adherence to Treatment Plan 1 5 ASSESSMENTS - Wound and Skin Assessment / Reassessment X - Simple Wound Assessment / Reassessment - one wound 1 5 []  - Complex Wound Assessment / Reassessment - multiple wounds 0 []  - Dermatologic / Skin Assessment (not related to wound area) 0 ASSESSMENTS - Focused Assessment X - Circumferential Edema Measurements - multi extremities 1 5 []  - Nutritional Assessment / Counseling / Intervention 0 X - Lower Extremity Assessment (monofilament, tuning fork, pulses) 1 5 []  - Peripheral Arterial Disease Assessment (using hand held doppler) 0 ASSESSMENTS - Ostomy and/or Continence Assessment and Care []  - Incontinence Assessment and Management 0 []  - Ostomy Care Assessment and Management (repouching, etc.) 0 PROCESS - Coordination of Care X - Simple Patient / Family Education for ongoing care 1 15 []  - Complex (extensive) Patient / Family Education for ongoing care 0 X - Staff obtains , Records, Test Results / Process Orders 1 10 []  - Staff telephones HHA, Nursing Homes / Clarify orders / etc 0 []  - Routine Transfer to another Facility (non-emergent condition) 0 Michelle Cabrera, [name] ( ) []  - Routine Hospital Admission (non-emergent condition) 0 []  - New Admissions / / Ordering NPWT, Apligraf, etc. 0 []  - Emergency Hospital Admission (emergent condition) 0 X - Simple Discharge Coordination 1 10 []  - Complex (extensive) Discharge Coordination 0 PROCESS - Special Needs []  - Pediatric / Minor Patient Management 0 []  - Isolation Patient Management 0 []  - Hearing / Language / Visual special needs 0 []  - Assessment of Community assistance (transportation, D/C planning, etc.) 0 []  - Additional assistance / Altered mentation 0 []  - Support Surface(s) Assessment (bed, cushion, seat, etc.) 0 INTERVENTIONS - Wound Cleansing / Measurement X - Simple Wound Cleansing - one wound 1 5 []  - Complex Wound Cleansing -  multiple wounds 0 X - Wound Imaging (photographs - any number of wounds) 1 5 []  - Wound Tracing (instead of photographs) 0 X - Simple Wound  Measurement - one wound 1 5 []  - Complex Wound Measurement - multiple wounds 0 INTERVENTIONS - Wound Dressings X - Small Wound Dressing one or multiple wounds 1 10 []  - Medium Wound Dressing one or multiple wounds 0 []  - Large Wound Dressing one or multiple wounds 0 []  - Application of Medications - topical 0 []  - Application of Medications - injection 0 INTERVENTIONS - Miscellaneous []  - External ear exam 0 Michelle Cabrera, [name] ([id number]) []  - Specimen Collection (cultures, biopsies, blood, body fluids, etc.) 0 []  - Specimen(s) / Culture(s) sent or taken to Lab for analysis 0 []  - Patient Transfer (multiple staff / Michiel Sites Lift / Similar devices) 0 []  - Simple Staple / Suture removal (25 or less) 0 []  - Complex Staple / Suture removal (26 or more) 0 []  - Hypo / Hyperglycemic Management (close monitor of Blood Glucose) 0 []  - Ankle / Brachial Index (ABI) - do not check if billed separately 0 X - Vital Signs 1 5 Has the patient been seen at the hospital within the last three years: Yes Total Score: 95 Level Of Care: New/Established - Level 3 Electronic Signature(s) Signed: 01/24/2015 4:37:27 PM By: Curtis Sites Entered By: Curtis Sites on 01/24/2015 09:38:47 Michelle Cabrera ([id number]) -------------------------------------------------------------------------------- Encounter Discharge Information Details Patient Name: Michelle Cabrera Date of Service: 01/24/2015 9:00 AM Medical Record Number: [id number] Patient Account Number: [id number] Date of Birth/Sex: 1945/12/25 (69 y.o. Female) Treating RN: Curtis Sites Primary Care Physician: Einar Crow Other Clinician: Referring Physician: Einar Crow Treating Physician/Extender: Rudene Re in Treatment: 9 Encounter Discharge Information Items Discharge Pain Level:  0 Discharge Condition: Stable Ambulatory Status: Ambulatory Discharge Destination: Home Transportation: Private Auto Accompanied By: self Schedule Follow-up Appointment: Yes Medication Reconciliation completed and provided to Patient/Care No Michelle Cabrera: Provided on Clinical Summary of Care: 01/24/2015 Form Type Recipient Paper Patient TO Electronic Signature(s) Signed: 01/26/2015 8:20:31 AM By: Gwenlyn Perking Previous Signature: 01/24/2015 4:37:27 PM Version By: Curtis Sites Entered By: Gwenlyn Perking on 01/26/2015 08:20:31 Michelle Cabrera ([id number]) -------------------------------------------------------------------------------- Lower Extremity Assessment Details Patient Name: Michelle Cabrera Date of Service: 01/24/2015 9:00 AM Medical Record Number: [id number] Patient Account Number: [id number] Date of Birth/Sex: 09-15-1945 (69 y.o. Female) Treating RN: Curtis Sites Primary Care Physician: Einar Crow Other Clinician: Referring Physician: Einar Crow Treating Physician/Extender: Rudene Re in Treatment: 9 Edema Assessment Assessed: [Left: No] [Right: No] E[Left: dema] [Right: :] Calf Left: Right: Point of Measurement: 33 cm From Medial Instep cm 33.7 cm Ankle Left: Right: Point of Measurement: 10 cm From Medial Instep cm 18.3 cm Vascular Assessment Pulses: Posterior Tibial Palpable: [Right:Yes] Dorsalis Pedis Palpable: [Right:Yes] Extremity colors, hair growth, and conditions: Extremity Color: [Right:Hyperpigmented] Hair Growth on Extremity: [Right:Yes] Temperature of Extremity: [Right:Warm] Capillary Refill: [Right:< 3 seconds] Electronic Signature(s) Signed: 01/24/2015 4:37:27 PM By: Curtis Sites Entered By: Curtis Sites on 01/24/2015 09:29:04 Michelle Cabrera ([id number]) -------------------------------------------------------------------------------- Multi Wound Chart Details Patient Name: Michelle Cabrera Date of Service:  01/24/2015 9:00 AM Medical Record Number: [id number] Patient Account Number: [id number] Date of Birth/Sex: 09/28/1945 (69 y.o. Female) Treating RN: Curtis Sites Primary Care Physician: Einar Crow Other Clinician: Referring Physician: Einar Crow Treating Physician/Extender: Rudene Re in Treatment: 9 Vital Signs Height(in): 62 Pulse(bpm): 89 Weight(lbs): 151 Blood Pressure 133/87 (mmHg): Body Mass Index(BMI): 28 Temperature(F): 98.2 Respiratory Rate 18 (breaths/min): Photos: [1:No Photos] [N/A:N/A] Wound Location: [1:Right Lower Leg - Medial N/A] Wounding Event: [1:Trauma] [N/A:N/A] Primary Etiology: [1:Trauma, Other] [N/A:N/A] Comorbid History: [1:Chronic Obstructive Pulmonary Disease (COPD),  Lupus Erythematosus, Rheumatoid Arthritis, Osteoarthritis, Neuropathy] [N/A:N/A] Date Acquired: [1:11/03/2014] [N/A:N/A] Weeks of Treatment: [1:9] [N/A:N/A] Wound Status: [1:Open] [N/A:N/A] Measurements L x W x D 0.7x1x0.2 [N/A:N/A] (cm) Area (cm) : [1:0.55] [N/A:N/A] Volume (cm) : [1:0.11] [N/A:N/A] % Reduction in Area: [1:95.40%] [N/A:N/A] % Reduction in Volume: 90.80% [N/A:N/A] Classification: [1:Full Thickness Without Exposed Support Structures] [N/A:N/A] Exudate Amount: [1:Small] [N/A:N/A] Exudate Type: [1:Serous] [N/A:N/A] Exudate Color: [1:amber] [N/A:N/A] Wound Margin: [1:Flat and Intact] [N/A:N/A] Granulation Amount: [1:Large (67-100%)] [N/A:N/A] Granulation Quality: [1:Pink] [N/A:N/A] Necrotic Amount: [1:Small (1-33%)] [N/A:N/A] Exposed Structures: [N/A:N/A] Fascia: No Fat: No Tendon: No Muscle: No Joint: No Bone: No Limited to Skin Breakdown Epithelialization: None N/A N/A Periwound Skin Texture: Edema: Yes N/A N/A Excoriation: No Induration: No Callus: No Crepitus: No Fluctuance: No Friable: No Rash: No Scarring: No Periwound Skin Moist: Yes N/A N/A Moisture: Maceration: No Dry/Scaly: No Periwound Skin Color: Atrophie  Blanche: No N/A N/A Cyanosis: No Ecchymosis: No Erythema: No Hemosiderin Staining: No Mottled: No Pallor: No Rubor: No Temperature: No Abnormality N/A N/A Tenderness on Yes N/A N/A Palpation: Wound Preparation: Ulcer Cleansing: N/A N/A Rinsed/Irrigated with Saline Topical Anesthetic Applied: Other: lidocaine 4% Treatment Notes Electronic Signature(s) Signed: 01/24/2015 4:37:27 PM By: Curtis Sites Entered By: Curtis Sites on 01/24/2015 09:30:09 Michelle Cabrera ([id number]) -------------------------------------------------------------------------------- Multi-Disciplinary Care Plan Details Patient Name: Michelle Cabrera Date of Service: 01/24/2015 9:00 AM Medical Record Number: [id number] Patient Account Number: [id number] Date of Birth/Sex: 1946-01-19 (69 y.o. Female) Treating RN: Curtis Sites Primary Care Physician: Einar Crow Other Clinician: Referring Physician: Einar Crow Treating Physician/Extender: Rudene Re in Treatment: 9 Active Inactive Electronic Signature(s) Signed: 03/08/2015 2:40:08 PM By: Curtis Sites Signed: 03/09/2015 8:28:26 AM By: Elliot Gurney RN, BSN, Kim RN, BSN Previous Signature: 01/24/2015 4:37:27 PM Version By: Curtis Sites Entered By: Elliot Gurney RN, BSN, Kim on 03/07/2015 15:56:03 Michelle Cabrera ([id number]) -------------------------------------------------------------------------------- Patient/Caregiver Education Details Patient Name: Michelle Cabrera Date of Service: 01/24/2015 9:00 AM Medical Record Number: [id number] Patient Account Number: [id number] Date of Birth/Gender: 1946/04/30 (69 y.o. Female) Treating RN: Curtis Sites Primary Care Physician: Einar Crow Other Clinician: Referring Physician: Einar Crow Treating Physician/Extender: Rudene Re in Treatment: 9 Education Assessment Education Provided To: Patient Education Topics Provided Wound/Skin  Impairment: Handouts: Other: wound care as ordered Methods: Demonstration, Explain/Verbal Responses: State content correctly Electronic Signature(s) Signed: 01/24/2015 4:37:27 PM By: Curtis Sites Entered By: Curtis Sites on 01/24/2015 09:39:27 Michelle Cabrera ([id number]) -------------------------------------------------------------------------------- Wound Assessment Details Patient Name: Michelle Cabrera Date of Service: 01/24/2015 9:00 AM Medical Record Number: [id number] Patient Account Number: [id number] Date of Birth/Sex: 08/19/1945 (69 y.o. Female) Treating RN: Curtis Sites Primary Care Physician: Einar Crow Other Clinician: Referring Physician: Einar Crow Treating Physician/Extender: Rudene Re in Treatment: 9 Wound Status Wound Number: 1 Primary Trauma, Other Etiology: Wound Location: Right Lower Leg - Medial Wound Open Wounding Event: Trauma Status: Date Acquired: 11/03/2014 Comorbid Chronic Obstructive Pulmonary Disease Weeks Of Treatment: 9 History: (COPD), Lupus Erythematosus, Clustered Wound: No Rheumatoid Arthritis, Osteoarthritis, Neuropathy Photos Photo Uploaded By: Curtis Sites on 01/24/2015 11:33:47 Wound Measurements Length: (cm) 0.7 Width: (cm) 1 Depth: (cm) 0.2 Area: (cm) 0.55 Volume: (cm) 0.11 % Reduction in Area: 95.4% % Reduction in Volume: 90.8% Epithelialization: None Tunneling: No Undermining: No Wound Description Full Thickness Without Exposed Classification: Support Structures Wound Margin: Flat and Intact Exudate Small Amount: Exudate Type: Serous Exudate Color: amber Foul Odor After Cleansing: No Wound Bed Granulation Amount: Large (67-100%) Exposed Structure Michelle Cabrera, Michelle H. ([id number]) Granulation Quality: Pink Fascia  Exposed: No Necrotic Amount: Small (1-33%) Fat Layer Exposed: No Necrotic Quality: Adherent Slough Tendon Exposed: No Muscle Exposed: No Joint Exposed: No Bone  Exposed: No Limited to Skin Breakdown Periwound Skin Texture Texture Color No Abnormalities Noted: No No Abnormalities Noted: No Callus: No Atrophie Blanche: No Crepitus: No Cyanosis: No Excoriation: No Ecchymosis: No Fluctuance: No Erythema: No Friable: No Hemosiderin Staining: No Induration: No Mottled: No Localized Edema: Yes Pallor: No Rash: No Rubor: No Scarring: No Temperature / Pain Moisture Temperature: No Abnormality No Abnormalities Noted: No Tenderness on Palpation: Yes Dry / Scaly: No Maceration: No Moist: Yes Wound Preparation Ulcer Cleansing: Rinsed/Irrigated with Saline Topical Anesthetic Applied: Other: lidocaine 4%, Electronic Signature(s) Signed: 01/24/2015 4:37:27 PM By: Curtis Sites Entered By: Curtis Sites on 01/24/2015 51:76:16 Michelle Cabrera ([id number]) -------------------------------------------------------------------------------- Vitals Details Patient Name: Michelle Cabrera Date of Service: 01/24/2015 9:00 AM Medical Record Number: [id number] Patient Account Number: [id number] Date of Birth/Sex: July 23, 1946 (69 y.o. Female) Treating RN: Curtis Sites Primary Care Physician: Einar Crow Other Clinician: Referring Physician: Einar Crow Treating Physician/Extender: Rudene Re in Treatment: 9 Vital Signs Time Taken: 09:22 Temperature (F): 98.2 Height (in): 62 Pulse (bpm): 89 Weight (lbs): 151 Respiratory Rate (breaths/min): 18 Body Mass Index (BMI): 27.6 Blood Pressure (mmHg): 133/87 Reference Range: 80 - 120 mg / dl Electronic Signature(s) Signed: 01/24/2015 4:37:27 PM By: Curtis Sites Entered By: Curtis Sites on 01/24/2015 09:23:02

## 2015-02-07 ENCOUNTER — Ambulatory Visit: Payer: BLUE CROSS/BLUE SHIELD | Admitting: Surgery

## 2015-02-10 ENCOUNTER — Ambulatory Visit: Payer: BLUE CROSS/BLUE SHIELD | Admitting: Surgery

## 2015-02-26 ENCOUNTER — Encounter: Payer: Self-pay | Admitting: Emergency Medicine

## 2015-02-26 ENCOUNTER — Emergency Department
Admission: EM | Admit: 2015-02-26 | Discharge: 2015-02-26 | Disposition: A | Discharge status: Home / Self Care | Payer: Medicare Other | Attending: Emergency Medicine | Admitting: Emergency Medicine

## 2015-02-26 ENCOUNTER — Other Ambulatory Visit: Payer: Self-pay

## 2015-02-26 ENCOUNTER — Emergency Department: Payer: Medicare Other

## 2015-02-26 DIAGNOSIS — J441 Chronic obstructive pulmonary disease with (acute) exacerbation: Secondary | ICD-10-CM | POA: Insufficient documentation

## 2015-02-26 DIAGNOSIS — Z720 Tobacco use: Secondary | ICD-10-CM | POA: Insufficient documentation

## 2015-02-26 DIAGNOSIS — R079 Chest pain, unspecified: Secondary | ICD-10-CM | POA: Diagnosis present

## 2015-02-26 DIAGNOSIS — I10 Essential (primary) hypertension: Secondary | ICD-10-CM | POA: Diagnosis not present

## 2015-02-26 DIAGNOSIS — Z79899 Other long term (current) drug therapy: Secondary | ICD-10-CM | POA: Diagnosis not present

## 2015-02-26 DIAGNOSIS — Z7951 Long term (current) use of inhaled steroids: Secondary | ICD-10-CM | POA: Insufficient documentation

## 2015-02-26 DIAGNOSIS — Z791 Long term (current) use of non-steroidal anti-inflammatories (NSAID): Secondary | ICD-10-CM | POA: Insufficient documentation

## 2015-02-26 DIAGNOSIS — Z792 Long term (current) use of antibiotics: Secondary | ICD-10-CM | POA: Insufficient documentation

## 2015-02-26 HISTORY — DX: Systemic involvement of connective tissue, unspecified: M35.9

## 2015-02-26 LAB — CBC
HCT: 36.1 % (ref 35.0–47.0)
Hemoglobin: 11.8 g/dL — ABNORMAL LOW (ref 12.0–16.0)
MCH: 29.4 pg (ref 26.0–34.0)
MCHC: 32.6 g/dL (ref 32.0–36.0)
MCV: 90 fL (ref 80.0–100.0)
Platelets: 425 10*3/uL (ref 150–440)
RBC: 4.01 MIL/uL (ref 3.80–5.20)
RDW: 16 % — AB (ref 11.5–14.5)
WBC: 11.7 10*3/uL — AB (ref 3.6–11.0)

## 2015-02-26 LAB — TROPONIN I: Troponin I: <0.03 ng/mL (ref <0.031)

## 2015-02-26 LAB — BASIC METABOLIC PANEL
ANION GAP: 7 (ref 5–15)
BUN: 18 mg/dL (ref 6–20)
CALCIUM: 8.5 mg/dL — AB (ref 8.9–10.3)
CHLORIDE: 104 mmol/L (ref 101–111)
CO2: 25 mmol/L (ref 22–32)
CREATININE: 0.49 mg/dL (ref 0.44–1.00)
GFR calc Af Amer: >60 mL/min (ref >60)
GFR calc non Af Amer: >60 mL/min (ref >60)
GLUCOSE: 140 mg/dL — AB (ref 65–99)
POTASSIUM: 3.7 mmol/L (ref 3.5–5.1)
Sodium: 136 mmol/L (ref 135–145)

## 2015-02-26 LAB — BRAIN NATRIURETIC PEPTIDE: B Natriuretic Peptide: 67 pg/mL (ref 0.0–100.0)

## 2015-02-26 MED ORDER — MORPHINE SULFATE 4 MG/ML IJ SOLN
4.0000 mg | Freq: Once | INTRAMUSCULAR | Status: AC
  Administered 2015-02-26: 4 mg via INTRAVENOUS
  Filled 2015-02-26: qty 1

## 2015-02-26 MED ORDER — IOHEXOL 350 MG/ML SOLN
75.0000 mL | Freq: Once | INTRAVENOUS | Status: AC | PRN
  Administered 2015-02-26: 75 mL via INTRAVENOUS

## 2015-02-26 MED ORDER — ONDANSETRON HCL 4 MG/2ML IJ SOLN
4.0000 mg | Freq: Once | INTRAMUSCULAR | Status: AC
  Administered 2015-02-26: 4 mg via INTRAVENOUS
  Filled 2015-02-26: qty 2

## 2015-02-26 MED ORDER — GI COCKTAIL ~~LOC~~
30.0000 mL | Freq: Once | ORAL | Status: AC
  Administered 2015-02-26: 30 mL via ORAL
  Filled 2015-02-26: qty 30

## 2015-02-26 NOTE — ED Notes (Signed)
Patient c/o centralized chest pain at the xiphoid process since 0400 this morning, Patient describes it as a 7/10 "cramping" with radiation to the left jaw and left arm. Patient received 324 ASA, 2 0.4 Nitro, and 50 mcg fentanyl enroute with EMS. Patient's symptoms noted to increase with inspiration and palpaton

## 2015-02-26 NOTE — Discharge Instructions (Signed)
No certain cause was found. Chest discomfort, however your exam and evaluation are reassuring.  Return to the emergency room for any worsening condition including chest pain, nausea, neck pain, passing out, trouble breathing, shortness of breath, or any other symptoms concerning to you   Chest Pain (Nonspecific) It is often hard to give a specific diagnosis for the cause of chest pain. There is always a chance that your pain could be related to something serious, such as a heart attack or a blood clot in the lungs. You need to follow up with your health care provider for further evaluation. CAUSES   Heartburn.  Pneumonia or bronchitis.  Anxiety or stress.  Inflammation around your heart (pericarditis) or lung (pleuritis or pleurisy).  A blood clot in the lung.  A collapsed lung (pneumothorax). It can develop suddenly on its own (spontaneous pneumothorax) or from trauma to the chest.  Shingles infection (herpes zoster virus). The chest wall is composed of bones, muscles, and cartilage. Any of these can be the source of the pain.  The bones can be bruised by injury.  The muscles or cartilage can be strained by coughing or overwork.  The cartilage can be affected by inflammation and become sore (costochondritis). DIAGNOSIS  Lab tests or other studies may be needed to find the cause of your pain. Your health care provider may have you take a test called an ambulatory electrocardiogram (ECG). An ECG records your heartbeat patterns over a 24-hour period. You may also have other tests, such as:  Transthoracic echocardiogram (TTE). During echocardiography, sound waves are used to evaluate how blood flows through your heart.  Transesophageal echocardiogram (TEE).  Cardiac monitoring. This allows your health care provider to monitor your heart rate and rhythm in real time.  Holter monitor. This is a portable device that records your heartbeat and can help diagnose heart arrhythmias. It  allows your health care provider to track your heart activity for several days, if needed.  Stress tests by exercise or by giving medicine that makes the heart beat faster. TREATMENT   Treatment depends on what may be causing your chest pain. Treatment may include:  Acid blockers for heartburn.  Anti-inflammatory medicine.  Pain medicine for inflammatory conditions.  Antibiotics if an infection is present.  You may be advised to change lifestyle habits. This includes stopping smoking and avoiding alcohol, caffeine, and chocolate.  You may be advised to keep your head raised (elevated) when sleeping. This reduces the chance of acid going backward from your stomach into your esophagus. Most of the time, nonspecific chest pain will improve within 2-3 days with rest and mild pain medicine.  HOME CARE INSTRUCTIONS   If antibiotics were prescribed, take them as directed. Finish them even if you start to feel better.  For the next few days, avoid physical activities that bring on chest pain. Continue physical activities as directed.  Do not use any tobacco products, including cigarettes, chewing tobacco, or electronic cigarettes.  Avoid drinking alcohol.  Only take medicine as directed by your health care provider.  Follow your health care provider's suggestions for further testing if your chest pain does not go away.  Keep any follow-up appointments you made. If you do not go to an appointment, you could develop lasting (chronic) problems with pain. If there is any problem keeping an appointment, call to reschedule. SEEK MEDICAL CARE IF:   Your chest pain does not go away, even after treatment.  You have a rash with blisters on  your chest.  You have a fever. SEEK IMMEDIATE MEDICAL CARE IF:   You have increased chest pain or pain that spreads to your arm, neck, jaw, back, or abdomen.  You have shortness of breath.  You have an increasing cough, or you cough up blood.  You  have severe back or abdominal pain.  You feel nauseous or vomit.  You have severe weakness.  You faint.  You have chills. This is an emergency. Do not wait to see if the pain will go away. Get medical help at once. Call your local emergency services (911 in [place] Do not drive yourself to the hospital. MAKE SURE YOU:   Understand these instructions.  Will watch your condition.  Will get help right away if you are not doing well or get worse. Document Released: 04/24/2005 Document Revised: 07/20/2013 Document Reviewed: 02/18/2008 [facility] Patient Information 2015 [place], [place]. This information is not intended to replace advice given to you by your health care provider. Make sure you discuss any questions you have with your health care provider.

## 2015-02-26 NOTE — ED Provider Notes (Addendum)
[facility] Emergency Department Provider Note   ____________________________________________  Time seen: 8:45 AM I have reviewed the triage vital signs and the triage nursing note.  HISTORY  Chief Complaint Chest Pain   Historian Patient and husband  HPI Michelle Cabrera is a 69 y.o. female who has no cardiac history that is known, but a history of COPD for which she takes 2 half liters of oxygen at night, who states she is having troubles at all night which is pretty common for her, and then at around 4 AM she had acute onset of central substernal chest pain which was sharp and unrelenting for several hours. Pain at worse was sent out of 10, currently is 6 out of 10 after nitroglycerin tablets and fentanyl with EMS as well as aspirin. Mild shortness of breath. No new coughing. No fever. Takes medicine for acid reflux/indigestion, and does not feel like this is an abdominal problem. Chest wall is tender to palpation. She's been having left-sided leg swelling and mild right-sided leg swelling, however states that her podiatrist feels like she was a stress fracture and is treated her with Lasix for the peripheral edema.   Past Medical History  Diagnosis Date  . Hypertension   . Closed compression fracture of L1 lumbar vertebra 11/2012  . COPD (chronic obstructive pulmonary disease)   . Anxiety   . Depression   . GERD (gastroesophageal reflux disease)   . Rheumatoid arthritis   . DDD (degenerative disc disease)   . Chronic lower back pain   . Collagen vascular disease     Patient Active Problem List   Diagnosis Date Noted  . Arthritis associated with another disorder 09/15/2014  . Wedge compression fracture of T6 vertebra 02/07/2014  . Intractable low back pain 12/27/2012  . DDD (degenerative disc disease) 12/27/2012  . Rheumatoid arthritis 12/27/2012  . Hypertension 12/27/2012    Past Surgical History  Procedure Laterality Date  . Lumbar disc  surgery       X 3  . Kyphoplasty N/A 01/04/2013    Procedure: L1 Kyphoplasty;  Surgeon: Hewitt Shorts, MD;  Location: [facility];  Service: Neurosurgery;  Laterality: N/A;  L1 Kyphoplasty  . Tubal ligation    . Foot surgery    . Wrist fusion Right     30 yrs ago  . Kyphoplasty N/A 02/07/2014    Procedure: THORACIC SIX KYPHOPLASTY;  Surgeon: Hewitt Shorts, MD;  Location: [facility];  Service: Neurosurgery;  Laterality: N/A;  T6 Kyphoplasty   . Hand tendon surgery Right 09/15/2014    "d/t RA"  . Tonsillectomy    . Posterior lumbar fusion      "got screws in"  . Back surgery    . Cataract extraction w/ intraocular lens  implant, bilateral Bilateral   . Finger arthroplasty Right 09/15/2014    Procedure: RIGHT MIDDLE FINGER, RING FINGER, SMALL FINGER EXTENSOR DIGITORUM COMMUMIS STABILIZATION WITH RIGHT MIDDLE FINGER MCP ARTHROPLASTY, POSSIBLE RING FINGER AND SMALL FINGER MCP ARTHROPLASTY;  Surgeon: Dominica Severin, MD;  Location: [facility] OR;  Service: Orthopedics;  Laterality: Right;    Current Outpatient Rx  Name  Route  Sig  Dispense  Refill  . acetaminophen (TYLENOL) 500 MG tablet   Oral   Take 1,000 mg by mouth every 6 (six) hours as needed for mild pain.         Marland Kitchen amLODipine (NORVASC) 5 MG tablet   Oral   Take 5 mg by mouth daily  as needed (Blood pressure).         . Calcium Carbonate-Vit D-Min (CALCIUM-VITAMIN D-MINERALS) 600-400 MG-UNIT CHEW   Oral   Chew 2 tablets by mouth 2 (two) times daily.          . DULoxetine (CYMBALTA) 30 MG capsule   Oral   Take 30 mg by mouth at bedtime.          . DULoxetine (CYMBALTA) 60 MG capsule   Oral   Take 60 mg by mouth every morning.         . etanercept (ENBREL) 50 MG/ML injection   Subcutaneous   Inject 50 mg into the skin once a week. Monday         . Fluticasone Furoate-Vilanterol (BREO ELLIPTA) 100-25 MCG/INH AEPB   Inhalation   Inhale 1 puff into the lungs every morning.         . furosemide (LASIX) 40 MG  tablet   Oral   Take 40 mg by mouth daily.          Marland Kitchen gabapentin (NEURONTIN) 300 MG capsule   Oral   Take 600 mg by mouth 3 (three) times daily as needed (leg pain).          Marland Kitchen ipratropium-albuterol (DUONEB) 0.5-2.5 (3) MG/3ML SOLN   Nebulization   Take 3 mLs by nebulization 4 (four) times daily.         . Menthol-Methyl Salicylate (BEN GAY ARTHRITIS FORMULA) 8-30 % CREA   Apply externally   Apply 1 application topically daily as needed (for back pain).         Marland Kitchen omeprazole (PRILOSEC) 20 MG capsule   Oral   Take 20 mg by mouth 2 (two) times daily.          Marland Kitchen oxyCODONE-acetaminophen (PERCOCET) 7.5-325 MG per tablet   Oral   Take 1 tablet by mouth every 6 (six) hours as needed for severe pain.          Marland Kitchen torsemide (DEMADEX) 10 MG tablet   Oral   Take 10 mg by mouth daily.         . traZODone (DESYREL) 50 MG tablet   Oral   Take 50-100 mg by mouth at bedtime as needed for sleep.          . vitamin C (ASCORBIC ACID) 500 MG tablet   Oral   Take 500 mg by mouth daily.         . zoledronic acid (RECLAST) 5 MG/100ML SOLN injection   Intravenous   Inject 5 mg into the vein See admin instructions. *uses once a year*         . cephALEXin (KEFLEX) 500 MG capsule   Oral   Take 1 capsule (500 mg total) by mouth 4 (four) times daily. Patient not taking: Reported on 02/26/2015   28 capsule   0   . Methotrexate, PF, 25 MG/0.4ML SOAJ   Subcutaneous   Inject 50 mg into the skin once a week. On Monday         . oxyCODONE (OXY IR/ROXICODONE) 5 MG immediate release tablet   Oral   Take 1-2 tablets (5-10 mg total) by mouth every 4 (four) hours as needed for moderate pain. Patient not taking: Reported on 02/26/2015   50 tablet   0   . predniSONE (DELTASONE) 5 MG tablet   Oral   Take 5 mg by mouth daily with breakfast.         .  torsemide (DEMADEX) 5 MG tablet   Oral   Take 5 mg by mouth daily.           Allergies Codeine; Gold-containing drug  products; Adhesive; and Dilaudid  Family History  Problem Relation Age of Onset  . CAD Father   . CAD Mother   . Hypertension Mother   . Peripheral vascular disease Mother     Social History History  Substance Use Topics  . Smoking status: Current Every Day Smoker -- 1.00 packs/day for 53 years    Types: Cigarettes  . Smokeless tobacco: Never Used  . Alcohol Use: No    Review of Systems  Constitutional: Negative for fever. Eyes: Negative for visual changes. ENT: Negative for sore throat. Cardiovascular: Negative for chest pain. Respiratory: No new cough. Chronic smoker. Gastrointestinal: Negative for abdominal pain, vomiting and diarrhea. Genitourinary: Negative for dysuria. Musculoskeletal: Negative for back pain. Skin: Negative for rash. Neurological: Negative for headaches, focal weakness or numbness. 10 point Review of Systems otherwise negative ____________________________________________   PHYSICAL EXAM:  VITAL SIGNS: ED Triage Vitals  Enc Vitals Group     BP 02/26/15 0833 97/62 mmHg     Pulse Rate 02/26/15 0833 90     Resp 02/26/15 0833 16     Temp 02/26/15 0833 98.7 F (37.1 C)     Temp Source 02/26/15 0833 Oral     SpO2 02/26/15 0833 96 %     Weight 02/26/15 0833 150 lb (68.04 kg)     Height 02/26/15 0833 5\' 2"  (1.575 m)     Head Cir --      Peak Flow --      Pain Score --      Pain Loc --      Pain Edu? --      Excl. in GC? --      Constitutional: Alert and oriented. Well appearing and in no distress. Eyes: Conjunctivae are normal. PERRL. Normal extraocular movements. ENT   Head: Normocephalic and atraumatic.   Nose: No congestion/rhinnorhea.   Mouth/Throat: Mucous membranes are moist.   Neck: No stridor. Cardiovascular/Chest: Lower central chest wall about xiphoid area is tender to palpation. Normal rate, regular rhythm.  No murmurs, rubs, or gallops. Respiratory: Normal respiratory effort without tachypnea nor retractions.  Breath sounds are clear and equal bilaterally. No wheezes/rales/rhonchi. Gastrointestinal: Soft. No distention, no guarding, no rebound. Nontender   Genitourinary/rectal:Deferred Musculoskeletal: Nontender with normal range of motion in all extremities. No joint effusions.  No lower extremity tenderness nor edema. Neurologic:  Normal speech and language. No gross or focal neurologic deficits are appreciated. Skin:  Skin is warm, dry and intact. No rash noted. Psychiatric: Mood and affect are normal. Speech and behavior are normal. Patient exhibits appropriate insight and judgment.  ____________________________________________   EKG I, , MD, the attending physician have personally viewed and interpreted all ECGs.  87 beats) normal sinus rhythm. Narrow QRS. Normal axis. Normal ST and T-wave. ____________________________________________  LABS (pertinent positives/negatives)  Metabolic panel without significant abnormality White blood cell count 11.7, hemoglobin 11.8 Troponin less than 0.03 BNP 67 Repeat troponin less than 0.03  ____________________________________________  RADIOLOGY All Xrays were viewed by me. Imaging interpreted by Radiologist.  Chest surgery two-view: Negative  Chest CT: IMPRESSION: No evidence of thoracic aortic dissection, pulmonary embolism, or other acute findings.  Mild emphysema and bibasilar scarring. No active lung disease.  Mild pericardial thickening, without effusion. __________________________________________  PROCEDURES  Procedure(s) performed: None Critical Care performed: None  ____________________________________________   ED COURSE / ASSESSMENT AND PLAN  CONSULTATIONS: None  Pertinent labs & imaging results that were available during my care of the patient were reviewed by me and considered in my medical decision making (see chart for details).   Nonspecific chest pain without obvious source. Examination is  negative for MI. Symptoms may be GI related, however patient doesn't still feel like this is GERD. No pleuritic chest pain or shortness of breath, and do not suspect PE. Patient does continue to complain of 6 out of 10 central chest pain constantly and so we discussed CT of the chest to rule out dissection. Chest CT was reassuring as well.  I discussed outpatient follow-up with a cardiologist one to 2 days, and for patient call the office.  Patient / Family / Caregiver informed of clinical course, medical decision-making process, and agree with plan.   I discussed return precautions, follow-up instructions, and discharged instructions with patient and/or family.  ___________________________________________   FINAL CLINICAL IMPRESSION(S) / ED DIAGNOSES   Final diagnoses:  Chest pain, unspecified chest pain type    FOLLOW UP  Referred to: Dr. Lindajo Royal, cardiology, 1-2 days.   Governor Rooks, MD 02/26/15 1447  Governor Rooks, MD 02/26/15 0881  Governor Rooks, MD 02/26/15 [id number]

## 2015-04-10 DIAGNOSIS — I10 Essential (primary) hypertension: Secondary | ICD-10-CM | POA: Insufficient documentation

## 2015-04-13 DIAGNOSIS — R079 Chest pain, unspecified: Secondary | ICD-10-CM | POA: Insufficient documentation

## 2015-04-13 DIAGNOSIS — E782 Mixed hyperlipidemia: Secondary | ICD-10-CM | POA: Insufficient documentation

## 2015-05-22 ENCOUNTER — Other Ambulatory Visit: Payer: Self-pay | Admitting: Internal Medicine

## 2015-05-22 DIAGNOSIS — Z1231 Encounter for screening mammogram for malignant neoplasm of breast: Secondary | ICD-10-CM

## 2015-05-25 ENCOUNTER — Ambulatory Visit
Admission: RE | Admit: 2015-05-25 | Discharge: 2015-05-25 | Disposition: A | Discharge status: Home / Self Care | Payer: Medicare Other | Source: Ambulatory Visit | Attending: Internal Medicine | Admitting: Internal Medicine

## 2015-05-25 DIAGNOSIS — Z1231 Encounter for screening mammogram for malignant neoplasm of breast: Secondary | ICD-10-CM | POA: Diagnosis present

## 2015-06-15 ENCOUNTER — Encounter (HOSPITAL_COMMUNITY): Payer: Self-pay | Admitting: Neurology

## 2015-06-15 ENCOUNTER — Emergency Department (HOSPITAL_COMMUNITY)
Admission: EM | Admit: 2015-06-15 | Discharge: 2015-06-15 | Disposition: A | Discharge status: Home / Self Care | Payer: Medicare Other | Attending: Emergency Medicine | Admitting: Emergency Medicine

## 2015-06-15 DIAGNOSIS — J449 Chronic obstructive pulmonary disease, unspecified: Secondary | ICD-10-CM | POA: Insufficient documentation

## 2015-06-15 DIAGNOSIS — F419 Anxiety disorder, unspecified: Secondary | ICD-10-CM | POA: Insufficient documentation

## 2015-06-15 DIAGNOSIS — F329 Major depressive disorder, single episode, unspecified: Secondary | ICD-10-CM | POA: Diagnosis not present

## 2015-06-15 DIAGNOSIS — F17210 Nicotine dependence, cigarettes, uncomplicated: Secondary | ICD-10-CM | POA: Diagnosis not present

## 2015-06-15 DIAGNOSIS — I10 Essential (primary) hypertension: Secondary | ICD-10-CM | POA: Insufficient documentation

## 2015-06-15 DIAGNOSIS — M549 Dorsalgia, unspecified: Secondary | ICD-10-CM

## 2015-06-15 DIAGNOSIS — M069 Rheumatoid arthritis, unspecified: Secondary | ICD-10-CM | POA: Diagnosis not present

## 2015-06-15 DIAGNOSIS — G8929 Other chronic pain: Secondary | ICD-10-CM | POA: Insufficient documentation

## 2015-06-15 DIAGNOSIS — K219 Gastro-esophageal reflux disease without esophagitis: Secondary | ICD-10-CM | POA: Insufficient documentation

## 2015-06-15 DIAGNOSIS — Z79899 Other long term (current) drug therapy: Secondary | ICD-10-CM | POA: Insufficient documentation

## 2015-06-15 DIAGNOSIS — M545 Low back pain: Secondary | ICD-10-CM | POA: Insufficient documentation

## 2015-06-15 DIAGNOSIS — Z8781 Personal history of (healed) traumatic fracture: Secondary | ICD-10-CM | POA: Insufficient documentation

## 2015-06-15 DIAGNOSIS — Z7951 Long term (current) use of inhaled steroids: Secondary | ICD-10-CM | POA: Insufficient documentation

## 2015-06-15 MED ORDER — HYDROMORPHONE HCL 1 MG/ML IJ SOLN
1.0000 mg | Freq: Once | INTRAMUSCULAR | Status: AC
  Administered 2015-06-15: 1 mg via INTRAMUSCULAR
  Filled 2015-06-15: qty 1

## 2015-06-15 MED ORDER — KETOROLAC TROMETHAMINE 60 MG/2ML IM SOLN
60.0000 mg | Freq: Once | INTRAMUSCULAR | Status: AC
Start: 2015-06-15 — End: 2015-06-15
  Administered 2015-06-15: 60 mg via INTRAMUSCULAR
  Filled 2015-06-15: qty 2

## 2015-06-15 NOTE — ED Notes (Signed)
MD at bedside. 

## 2015-06-15 NOTE — Discharge Instructions (Signed)
°  Please follow-up with your PCP or neurologist tomorrow for further evaluation and management of your symptoms. Continue taking your home pain medicines for your discomfort. Return to ED for any new or worsening symptoms.  Chronic Back Pain  When back pain lasts longer than 3 months, it is called chronic back pain.People with chronic back pain often go through certain periods that are more intense (flare-ups).  CAUSES Chronic back pain can be caused by wear and tear (degeneration) on different structures in your back. These structures include:  The bones of your spine (vertebrae) and the joints surrounding your spinal cord and nerve roots (facets).  The strong, fibrous tissues that connect your vertebrae (ligaments). Degeneration of these structures may result in pressure on your nerves. This can lead to constant pain. HOME CARE INSTRUCTIONS  Avoid bending, heavy lifting, prolonged sitting, and activities which make the problem worse.  Take brief periods of rest throughout the day to reduce your pain. Lying down or standing usually is better than sitting while you are resting.  Take over-the-counter or prescription medicines only as directed by your caregiver. SEEK IMMEDIATE MEDICAL CARE IF:   You have weakness or numbness in one of your legs or feet.  You have trouble controlling your bladder or bowels.  You have nausea, vomiting, abdominal pain, shortness of breath, or fainting.   This information is not intended to replace advice given to you by your health care provider. Make sure you discuss any questions you have with your health care provider.   Document Released: 08/22/2004 Document Revised: 10/07/2011 Document Reviewed: 01/02/2015 Elsevier Interactive Patient Education [employer]

## 2015-06-15 NOTE — ED Provider Notes (Signed)
CSN: [id number]     Arrival date & time 06/15/15  1335 History   First MD Initiated Contact with Patient 06/15/15 1646     Chief Complaint  Patient presents with  . Back Pain     (Consider location/radiation/quality/duration/timing/severity/associated sxs/prior Treatment) HPI Michelle Cabrera is a 69 y.o. female with a history of chronic back pain followed by neurosurgery, comes in for evaluation of acute exacerbation of her back pain. She reports over the past week, she has had worsening lower back pain, no injury or other trauma. She reports she has called her neurosurgeon, Dr. Newell Coral, but has not heard back yet. She denies any fevers, chills, numbness or weakness, loss of bowel or bladder function, saddle paresthesias. She reports low back pain that radiates into her left thigh and characterized as "an electrical shock feeling". She reports this pain is typical of her low back pain, just worse. She has taken her home oxycodone and gabapentin without relief of her symptoms. Certain movements exacerbate her pain. Nothing seems to make it better. Pain now in the ED rated as moderate. No other modifying factors.  Past Medical History  Diagnosis Date  . Hypertension   . Closed compression fracture of L1 lumbar vertebra (HCC) 11/2012  . COPD (chronic obstructive pulmonary disease) (HCC)   . Anxiety   . Depression   . GERD (gastroesophageal reflux disease)   . Rheumatoid arthritis (HCC)   . DDD (degenerative disc disease)   . Chronic lower back pain   . Collagen vascular disease [facility])    Past Surgical History  Procedure Laterality Date  . Lumbar disc surgery       X 3  . Kyphoplasty N/A 01/04/2013    Procedure: L1 Kyphoplasty;  Surgeon: Hewitt Shorts, MD;  Location: [facility];  Service: Neurosurgery;  Laterality: N/A;  L1 Kyphoplasty  . Tubal ligation    . Foot surgery    . Wrist fusion Right     30 yrs ago  . Kyphoplasty N/A 02/07/2014    Procedure: THORACIC SIX KYPHOPLASTY;   Surgeon: Hewitt Shorts, MD;  Location: [facility];  Service: Neurosurgery;  Laterality: N/A;  T6 Kyphoplasty   . Hand tendon surgery Right 09/15/2014    "d/t RA"  . Tonsillectomy    . Posterior lumbar fusion      "got screws in"  . Back surgery    . Cataract extraction w/ intraocular lens  implant, bilateral Bilateral   . Finger arthroplasty Right 09/15/2014    Procedure: RIGHT MIDDLE FINGER, RING FINGER, SMALL FINGER EXTENSOR DIGITORUM COMMUMIS STABILIZATION WITH RIGHT MIDDLE FINGER MCP ARTHROPLASTY, POSSIBLE RING FINGER AND SMALL FINGER MCP ARTHROPLASTY;  Surgeon: Dominica Severin, MD;  Location: [facility] OR;  Service: Orthopedics;  Laterality: Right;  . Breast biopsy Left     negative   Family History  Problem Relation Age of Onset  . CAD Father   . CAD Mother   . Hypertension Mother   . Peripheral vascular disease Mother   . Breast cancer Maternal Aunt 6   Social History  Substance Use Topics  . Smoking status: Current Every Day Smoker -- 1.00 packs/day for 53 years    Types: Cigarettes  . Smokeless tobacco: Never Used  . Alcohol Use: No   OB History    No data available     Review of Systems A 10 point review of systems was completed and was negative except for pertinent positives and negatives as mentioned in the history  of present illness     Allergies  Codeine; Gold-containing drug products; Adhesive; Dilaudid; and Pravastatin  Home Medications   Prior to Admission medications   Medication Sig Start Date End Date Taking? Authorizing Provider  Calcium Carbonate-Vit D-Min (CALCIUM-VITAMIN D-MINERALS) 600-400 MG-UNIT CHEW Chew 2 tablets by mouth 2 (two) times daily.    Yes Historical Provider, MD  DULoxetine (CYMBALTA) 30 MG capsule Take 30 mg by mouth at bedtime.    Yes Historical Provider, MD  DULoxetine (CYMBALTA) 60 MG capsule Take 60 mg by mouth every morning.   Yes Historical Provider, MD  etanercept (ENBREL) 50 MG/ML injection Inject 50 mg into the skin once a  week. Friday   Yes Historical Provider, MD  Fluticasone Furoate-Vilanterol (BREO ELLIPTA) 100-25 MCG/INH AEPB Inhale 1 puff into the lungs every morning.   Yes Historical Provider, MD  furosemide (LASIX) 40 MG tablet Take 40 mg by mouth daily as needed for fluid or edema.    Yes Historical Provider, MD  gabapentin (NEURONTIN) 300 MG capsule Take 600 mg by mouth 3 (three) times daily as needed (leg pain).    Yes Historical Provider, MD  ibuprofen (ADVIL,MOTRIN) 200 MG tablet Take 400 mg by mouth every 6 (six) hours as needed for mild pain.   Yes Historical Provider, MD  ipratropium-albuterol (DUONEB) 0.5-2.5 (3) MG/3ML SOLN Take 3 mLs by nebulization every 6 (six) hours as needed (SOB).    Yes Historical Provider, MD  Menthol-Methyl Salicylate (BEN GAY ARTHRITIS FORMULA) 8-30 % CREA Apply 1 application topically 2 (two) times daily as needed (for back pain).    Yes Historical Provider, MD  Methotrexate, PF, 25 MG/0.4ML SOAJ Inject 50 mg into the skin once a week. Friday   Yes Historical Provider, MD  omeprazole (PRILOSEC) 20 MG capsule Take 20 mg by mouth 2 (two) times daily.    Yes Historical Provider, MD  oxyCODONE-acetaminophen (PERCOCET) 7.5-325 MG per tablet Take 1 tablet by mouth every 6 (six) hours as needed for severe pain.    Yes Historical Provider, MD  predniSONE (DELTASONE) 5 MG tablet Take 5 mg by mouth as needed (flare up).    Yes Historical Provider, MD  torsemide (DEMADEX) 10 MG tablet Take 10 mg by mouth daily as needed (fluid).    Yes Historical Provider, MD  traZODone (DESYREL) 50 MG tablet Take 50-100 mg by mouth at bedtime as needed for sleep.    Yes Historical Provider, MD  vitamin C (ASCORBIC ACID) 500 MG tablet Take 500 mg by mouth daily.   Yes Historical Provider, MD  zoledronic acid (RECLAST) 5 MG/100ML SOLN injection Inject 5 mg into the vein See admin instructions. *uses once a year*   Yes Historical Provider, MD  cephALEXin (KEFLEX) 500 MG capsule Take 1 capsule (500 mg  total) by mouth 4 (four) times daily. Patient not taking: Reported on 02/26/2015 09/16/14   Dominica Severin, MD  oxyCODONE (OXY IR/ROXICODONE) 5 MG immediate release tablet Take 1-2 tablets (5-10 mg total) by mouth every 4 (four) hours as needed for moderate pain. Patient not taking: Reported on 02/26/2015 09/16/14   Dominica Severin, MD   BP 135/65 mmHg  Pulse 86  Temp(Src) 98.4 F (36.9 C) (Oral)  Resp 19  SpO2 93% Physical Exam  Constitutional: She is oriented to person, place, and time. She appears well-developed and well-nourished.  HENT:  Head: Normocephalic and atraumatic.  Mouth/Throat: Oropharynx is clear and moist.  Eyes: Conjunctivae are normal. Pupils are equal, round, and reactive to light.  Right eye exhibits no discharge. Left eye exhibits no discharge. No scleral icterus.  Neck: Neck supple.  Cardiovascular: Normal rate, regular rhythm and normal heart sounds.   Pulmonary/Chest: Effort normal and breath sounds normal. No respiratory distress. She has no wheezes. She has no rales.  Abdominal: Soft. There is no tenderness.  Musculoskeletal: Normal range of motion. She exhibits no edema or tenderness.  Neurological: She is alert and oriented to person, place, and time.  Cranial Nerves II-XII grossly intact. Motor strength is 5/5 in bilateral lower extremities. Sensation is intact to light touch. Gait is somewhat antalgic but without any ataxia.  Skin: Skin is warm and dry. No rash noted.  Psychiatric: She has a normal mood and affect.  Nursing note and vitals reviewed.   ED Course  Procedures (including critical care time) Labs Review Labs Reviewed - No data to display  Imaging Review No results found. I have personally reviewed and evaluated these images and lab results as part of my medical decision-making.   EKG Interpretation None     Meds given in ED:  Medications  HYDROmorphone (DILAUDID) injection 1 mg (1 mg Intramuscular Given 06/15/15 1808)  ketorolac  (TORADOL) injection 60 mg (60 mg Intramuscular Given 06/15/15 1807)    Discharge Medication List as of 06/15/2015  7:09 PM     Filed Vitals:   06/15/15 1830 06/15/15 1845 06/15/15 1900 06/15/15 1915  BP: 136/95 135/65    Pulse: 86 86    Temp:    98.4 F (36.9 C)  TempSrc:    Oral  Resp:   19   SpO2: 99% 93%      MDM  CONNY JAVED is a 68 y.o. female with history of chronic low back pain comes in for evaluation of acute exacerbation of her low back pain. On arrival she is afebrile, hemodynamically stable with normal vital signs. She reports trying to obtain an appointment with her neurosurgeon, Dr. Newell Coral and is awaiting a follow-up appointment. On exam, she is diffusely tender throughout left lower back and left upper thigh, nonfocal neuro exam. Full active range of motion. Muscle compartments are all soft. Gait is slow, but non-ataxic. Will treat acute exacerbation of pain. Patient reports relief after IM pain medicines. Discussed follow-up with neurosurgeon/PCP in the next 2-3 days and she agrees to this plan. No pathologic back pain red flags. Low suspicion for cauda equina or conus medullaris or other spinal cord pathology. Overall, patient appears well, nontoxic, hemodynamically stable and appropriate for discharge. Final diagnoses:  Left low back pain, with sciatica presence unspecified  Chronic back pain        Joycie Peek, PA-C 06/15/15 2320  Rolland Porter, MD 06/22/15 1047

## 2015-06-15 NOTE — ED Notes (Addendum)
Pt reports lower back pain for 1 week, has history of back surgery x 5 times. Has hardware in her back. Denies falls or injuries. Has been waiting for her doctor to call back for appt but has not. Pt is ambulatory but gait is slow. Reports pain radiates down left inner leg

## 2015-07-15 ENCOUNTER — Emergency Department
Admission: EM | Admit: 2015-07-15 | Discharge: 2015-07-15 | Disposition: A | Discharge status: Home / Self Care | Payer: Medicare Other | Attending: Emergency Medicine | Admitting: Emergency Medicine

## 2015-07-15 ENCOUNTER — Encounter: Payer: Self-pay | Admitting: Emergency Medicine

## 2015-07-15 DIAGNOSIS — Z79899 Other long term (current) drug therapy: Secondary | ICD-10-CM | POA: Diagnosis not present

## 2015-07-15 DIAGNOSIS — L03116 Cellulitis of left lower limb: Secondary | ICD-10-CM | POA: Diagnosis not present

## 2015-07-15 DIAGNOSIS — F17210 Nicotine dependence, cigarettes, uncomplicated: Secondary | ICD-10-CM | POA: Insufficient documentation

## 2015-07-15 DIAGNOSIS — I10 Essential (primary) hypertension: Secondary | ICD-10-CM | POA: Insufficient documentation

## 2015-07-15 DIAGNOSIS — Z7951 Long term (current) use of inhaled steroids: Secondary | ICD-10-CM | POA: Insufficient documentation

## 2015-07-15 DIAGNOSIS — M79605 Pain in left leg: Secondary | ICD-10-CM | POA: Diagnosis present

## 2015-07-15 LAB — CBC WITH DIFFERENTIAL/PLATELET
BASOS PCT: 0 %
Basophils Absolute: 0.1 10*3/uL (ref 0–0.1)
Eosinophils Absolute: 0.1 10*3/uL (ref 0–0.7)
Eosinophils Relative: 0 %
HCT: 41.1 % (ref 35.0–47.0)
HEMOGLOBIN: 13.5 g/dL (ref 12.0–16.0)
LYMPHS ABS: 1.4 10*3/uL (ref 1.0–3.6)
Lymphocytes Relative: 9 %
MCH: 29.4 pg (ref 26.0–34.0)
MCHC: 32.8 g/dL (ref 32.0–36.0)
MCV: 89.5 fL (ref 80.0–100.0)
Monocytes Absolute: 1.1 10*3/uL — ABNORMAL HIGH (ref 0.2–0.9)
Monocytes Relative: 7 %
NEUTROS PCT: 84 %
Neutro Abs: 12.8 10*3/uL — ABNORMAL HIGH (ref 1.4–6.5)
PLATELETS: 346 10*3/uL (ref 150–440)
RBC: 4.59 MIL/uL (ref 3.80–5.20)
RDW: 18.1 % — AB (ref 11.5–14.5)
WBC: 15.4 10*3/uL — AB (ref 3.6–11.0)

## 2015-07-15 LAB — COMPREHENSIVE METABOLIC PANEL
ALBUMIN: 3.9 g/dL (ref 3.5–5.0)
ALK PHOS: 137 U/L — AB (ref 38–126)
ALT: 22 U/L (ref 14–54)
AST: 25 U/L (ref 15–41)
Anion gap: 11 (ref 5–15)
BUN: 17 mg/dL (ref 6–20)
CO2: 27 mmol/L (ref 22–32)
Calcium: 8.7 mg/dL — ABNORMAL LOW (ref 8.9–10.3)
Chloride: 98 mmol/L — ABNORMAL LOW (ref 101–111)
Creatinine, Ser: 0.75 mg/dL (ref 0.44–1.00)
GFR calc Af Amer: >60 mL/min (ref >60)
GFR calc non Af Amer: >60 mL/min (ref >60)
GLUCOSE: 140 mg/dL — AB (ref 65–99)
POTASSIUM: 3.7 mmol/L (ref 3.5–5.1)
SODIUM: 136 mmol/L (ref 135–145)
Total Bilirubin: 0.8 mg/dL (ref 0.3–1.2)
Total Protein: 7.6 g/dL (ref 6.5–8.1)

## 2015-07-15 MED ORDER — CEPHALEXIN 500 MG PO CAPS: 500.0000 mg | ORAL_CAPSULE | Freq: Three times a day (TID) | ORAL | Status: DC

## 2015-07-15 MED ORDER — SULFAMETHOXAZOLE-TRIMETHOPRIM 800-160 MG PO TABS: 1.0000 | ORAL_TABLET | Freq: Two times a day (BID) | ORAL | Status: DC

## 2015-07-15 NOTE — ED Notes (Signed)
States noted L lower leg slightly reddened, today noted solid red and painful knee to ankle, no injury noted. Pedal pulse present.

## 2015-07-15 NOTE — Discharge Instructions (Signed)
Take Keflex and Bactrim as prescribed and follow up with your doctor to treat the skin infection in your leg.  Cellulitis Cellulitis is an infection of the skin and the tissue beneath it. The infected area is usually red and tender. Cellulitis occurs most often in the arms and lower legs.  CAUSES  Cellulitis is caused by bacteria that enter the skin through cracks or cuts in the skin. The most common types of bacteria that cause cellulitis are staphylococci and streptococci. SIGNS AND SYMPTOMS   Redness and warmth.  Swelling.  Tenderness or pain.  Fever. DIAGNOSIS  Your health care provider can usually determine what is wrong based on a physical exam. Blood tests may also be done. TREATMENT  Treatment usually involves taking an antibiotic medicine. HOME CARE INSTRUCTIONS   Take your antibiotic medicine as directed by your health care provider. Finish the antibiotic even if you start to feel better.  Keep the infected arm or leg elevated to reduce swelling.  Apply a warm cloth to the affected area up to 4 times per day to relieve pain.  Take medicines only as directed by your health care provider.  Keep all follow-up visits as directed by your health care provider. SEEK MEDICAL CARE IF:   You notice red streaks coming from the infected area.  Your red area gets larger or turns dark in color.  Your bone or joint underneath the infected area becomes painful after the skin has healed.  Your infection returns in the same area or another area.  You notice a swollen bump in the infected area.  You develop new symptoms.  You have a fever. SEEK IMMEDIATE MEDICAL CARE IF:   You feel very sleepy.  You develop vomiting or diarrhea.  You have a general ill feeling (malaise) with muscle aches and pains.   This information is not intended to replace advice given to you by your health care provider. Make sure you discuss any questions you have with your health care provider.     Document Released: 04/24/2005 Document Revised: 04/05/2015 Document Reviewed: 09/30/2011 Elsevier Interactive Patient Education [employer]

## 2015-07-15 NOTE — ED Provider Notes (Signed)
[facility] Emergency Department Provider Note  ____________________________________________  Time seen: 3:50 PM  I have reviewed the triage vital signs and the nursing notes.   HISTORY  Chief Complaint Leg Pain    HPI TANGEE MARSZALEK is a 69 y.o. female who complains of left leg pain and redness since yesterday. She recently were compression stocking over the left leg was going to see her doctor. She has a history of peripheral edema does not feel like it's particularly worse recently. No travel, hospitalizations or surgeries. No recent wounds to the leg. She denies a history of diabetes.  No history of DVT or PE. Denies fever or chills chest pain or shortness of breath.   Past Medical History  Diagnosis Date  . Hypertension   . Closed compression fracture of L1 lumbar vertebra (HCC) 11/2012  . COPD (chronic obstructive pulmonary disease) (HCC)   . Anxiety   . Depression   . GERD (gastroesophageal reflux disease)   . Rheumatoid arthritis (HCC)   . DDD (degenerative disc disease)   . Chronic lower back pain   . Collagen vascular disease [facility])      Patient Active Problem List   Diagnosis Date Noted  . Arthritis associated with another disorder 09/15/2014  . Wedge compression fracture of T6 vertebra (HCC) 02/07/2014  . Intractable low back pain 12/27/2012  . DDD (degenerative disc disease) 12/27/2012  . Rheumatoid arthritis (HCC) 12/27/2012  . Hypertension 12/27/2012     Past Surgical History  Procedure Laterality Date  . Lumbar disc surgery       X 3  . Kyphoplasty N/A 01/04/2013    Procedure: L1 Kyphoplasty;  Surgeon: Hewitt Shorts, MD;  Location: [facility];  Service: Neurosurgery;  Laterality: N/A;  L1 Kyphoplasty  . Tubal ligation    . Foot surgery    . Wrist fusion Right     30 yrs ago  . Kyphoplasty N/A 02/07/2014    Procedure: THORACIC SIX KYPHOPLASTY;  Surgeon: Hewitt Shorts, MD;  Location: [facility];  Service:  Neurosurgery;  Laterality: N/A;  T6 Kyphoplasty   . Hand tendon surgery Right 09/15/2014    "d/t RA"  . Tonsillectomy    . Posterior lumbar fusion      "got screws in"  . Back surgery    . Cataract extraction w/ intraocular lens  implant, bilateral Bilateral   . Finger arthroplasty Right 09/15/2014    Procedure: RIGHT MIDDLE FINGER, RING FINGER, SMALL FINGER EXTENSOR DIGITORUM COMMUMIS STABILIZATION WITH RIGHT MIDDLE FINGER MCP ARTHROPLASTY, POSSIBLE RING FINGER AND SMALL FINGER MCP ARTHROPLASTY;  Surgeon: Dominica Severin, MD;  Location: [facility] OR;  Service: Orthopedics;  Laterality: Right;  . Breast biopsy Left     negative     Current Outpatient Rx  Name  Route  Sig  Dispense  Refill  . Calcium Carbonate-Vit D-Min (CALCIUM-VITAMIN D-MINERALS) 600-400 MG-UNIT CHEW   Oral   Chew 2 tablets by mouth 2 (two) times daily.          . cephALEXin (KEFLEX) 500 MG capsule   Oral   Take 1 capsule (500 mg total) by mouth 3 (three) times daily.   21 capsule   0   . DULoxetine (CYMBALTA) 30 MG capsule   Oral   Take 30 mg by mouth at bedtime.          . DULoxetine (CYMBALTA) 60 MG capsule   Oral   Take 60 mg by mouth every morning.         Marland Kitchen  etanercept (ENBREL) 50 MG/ML injection   Subcutaneous   Inject 50 mg into the skin once a week. Friday         . Fluticasone Furoate-Vilanterol (BREO ELLIPTA) 100-25 MCG/INH AEPB   Inhalation   Inhale 1 puff into the lungs every morning.         . furosemide (LASIX) 40 MG tablet   Oral   Take 40 mg by mouth daily as needed for fluid or edema.          . gabapentin (NEURONTIN) 300 MG capsule   Oral   Take 600 mg by mouth 3 (three) times daily as needed (leg pain).          Marland Kitchen ibuprofen (ADVIL,MOTRIN) 200 MG tablet   Oral   Take 400 mg by mouth every 6 (six) hours as needed for mild pain.         Marland Kitchen ipratropium-albuterol (DUONEB) 0.5-2.5 (3) MG/3ML SOLN   Nebulization   Take 3 mLs by nebulization every 6 (six) hours as needed  (SOB).          . Menthol-Methyl Salicylate (BEN GAY ARTHRITIS FORMULA) 8-30 % CREA   Apply externally   Apply 1 application topically 2 (two) times daily as needed (for back pain).          . Methotrexate, PF, 25 MG/0.4ML SOAJ   Subcutaneous   Inject 50 mg into the skin once a week. Friday         . omeprazole (PRILOSEC) 20 MG capsule   Oral   Take 20 mg by mouth 2 (two) times daily.          Marland Kitchen oxyCODONE (OXY IR/ROXICODONE) 5 MG immediate release tablet   Oral   Take 1-2 tablets (5-10 mg total) by mouth every 4 (four) hours as needed for moderate pain. Patient not taking: Reported on 02/26/2015   50 tablet   0   . oxyCODONE-acetaminophen (PERCOCET) 7.5-325 MG per tablet   Oral   Take 1 tablet by mouth every 6 (six) hours as needed for severe pain.          . predniSONE (DELTASONE) 5 MG tablet   Oral   Take 5 mg by mouth as needed (flare up).          . sulfamethoxazole-trimethoprim (BACTRIM DS) 800-160 MG tablet   Oral   Take 1 tablet by mouth 2 (two) times daily.   14 tablet   0   . torsemide (DEMADEX) 10 MG tablet   Oral   Take 10 mg by mouth daily as needed (fluid).          . traZODone (DESYREL) 50 MG tablet   Oral   Take 50-100 mg by mouth at bedtime as needed for sleep.          . vitamin C (ASCORBIC ACID) 500 MG tablet   Oral   Take 500 mg by mouth daily.         . zoledronic acid (RECLAST) 5 MG/100ML SOLN injection   Intravenous   Inject 5 mg into the vein See admin instructions. *uses once a year*            Allergies Codeine; Gold-containing drug products; Adhesive; Dilaudid; and Pravastatin   Family History  Problem Relation Age of Onset  . CAD Father   . CAD Mother   . Hypertension Mother   . Peripheral vascular disease Mother   . Breast cancer Maternal Aunt 50  Social History Social History  Substance Use Topics  . Smoking status: Current Every Day Smoker -- 1.00 packs/day for 53 years    Types: Cigarettes  .  Smokeless tobacco: Never Used  . Alcohol Use: No    Review of Systems  Constitutional:   No fever or chills. No weight changes Eyes:   No blurry vision or double vision.  ENT:   No sore throat. Cardiovascular:   No chest pain. Respiratory:   No dyspnea or cough. Gastrointestinal:   Negative for abdominal pain, vomiting and diarrhea.  No BRBPR or melena. Genitourinary:   Negative for dysuria, urinary retention, bloody urine, or difficulty urinating. Musculoskeletal:   Left leg pain Skin:   Negative for rash. Neurological:   Negative for headaches, focal weakness or numbness. Psychiatric:  No anxiety or depression.   Endocrine:  No hot/cold intolerance, changes in energy, or sleep difficulty.  10-point ROS otherwise negative.  ____________________________________________   PHYSICAL EXAM:  VITAL SIGNS: ED Triage Vitals  Enc Vitals Group     BP 07/15/15 1246 129/79 mmHg     Pulse Rate 07/15/15 1246 103     Resp 07/15/15 1246 18     Temp 07/15/15 1246 98.7 F (37.1 C)     Temp Source 07/15/15 1246 Oral     SpO2 07/15/15 1246 98 %     Weight 07/15/15 1246 153 lb (69.4 kg)     Height 07/15/15 1246 5\' 2"  (1.575 m)     Head Cir --      Peak Flow --      Pain Score 07/15/15 1249 10     Pain Loc --      Pain Edu? --      Excl. in GC? --      Constitutional:   Alert and oriented. Well appearing and in no distress. Eyes:   No scleral icterus. No conjunctival pallor. PERRL. EOMI ENT   Head:   Normocephalic and atraumatic.   Nose:   No congestion/rhinnorhea. No septal hematoma   Mouth/Throat:   MMM, no pharyngeal erythema. No peritonsillar mass. No uvula shift.   Neck:   No stridor. No SubQ emphysema. No meningismus. Hematological/Lymphatic/Immunilogical:   No cervical lymphadenopathy. Cardiovascular:   RRR. Normal and symmetric distal pulses are present in all extremities. No murmurs, rubs, or gallops. Respiratory:   Normal respiratory effort without tachypnea  nor retractions. Breath sounds are clear and equal bilaterally. No wheezes/rales/rhonchi. Gastrointestinal:   Soft and nontender. No distention. There is no CVA tenderness.  No rebound, rigidity, or guarding. Genitourinary:   deferred Musculoskeletal:   Bright erythema of the left lower leg in the mid shin area in a circumferential band with poorly demarcated border covering a region approximately 8 cm tall. No appreciable edema. There is warm and tender to the touch. Neurologic:   Normal speech and language.  CN 2-10 normal. Motor grossly intact. No pronator drift.  Normal gait. No gross focal neurologic deficits are appreciated.  Skin:    Skin is warm, dry and intact. Red rash to the left lower extremity as above.  No petechiae, purpura, or bullae. Psychiatric:   Mood and affect are normal. Speech and behavior are normal. Patient exhibits appropriate insight and judgment.  ____________________________________________    LABS (pertinent positives/negatives) (all labs ordered are listed, but only abnormal results are displayed) Labs Reviewed  CBC WITH DIFFERENTIAL/PLATELET - Abnormal; Notable for the following:    WBC 15.4 (*)    RDW 18.1 (*)  Neutro Abs 12.8 (*)    Monocytes Absolute 1.1 (*)    All other components within normal limits  COMPREHENSIVE METABOLIC PANEL - Abnormal; Notable for the following:    Chloride 98 (*)    Glucose, Bld 140 (*)    Calcium 8.7 (*)    Alkaline Phosphatase 137 (*)    All other components within normal limits   ____________________________________________   EKG    ____________________________________________    RADIOLOGY    ____________________________________________   PROCEDURES   ____________________________________________   INITIAL IMPRESSION / ASSESSMENT AND PLAN / ED COURSE  Pertinent labs & imaging results that were available during my care of the patient were reviewed by me and considered in my medical decision  making (see chart for details).  Patient presents with cellulitis of the left leg. Low suspicion for DVT necrotizing fasciitis or abscess. No appreciable wounds. She does not have any significant circulatory dysfunction or impairment or diabetes and is a good candidate for outpatient therapy with oral antibiotics. We'll have her follow-up with her primary care doctor, Dr. Dareen Piano within a week. I'll start her on Bactrim and Keflex.     ____________________________________________   FINAL CLINICAL IMPRESSION(S) / ED DIAGNOSES  Final diagnoses:  Left leg cellulitis      Sharman Cheek, MD 07/15/15 [phone]

## 2015-07-26 ENCOUNTER — Encounter: Payer: Self-pay | Admitting: *Deleted

## 2015-07-26 ENCOUNTER — Emergency Department: Payer: Medicare Other

## 2015-07-26 ENCOUNTER — Inpatient Hospital Stay
Admission: EM | Admit: 2015-07-26 | Discharge: 2015-08-01 | DRG: 190 | Disposition: A | Discharge status: Home Health | Payer: Medicare Other | Attending: Internal Medicine | Admitting: Internal Medicine

## 2015-07-26 DIAGNOSIS — G9340 Encephalopathy, unspecified: Secondary | ICD-10-CM | POA: Diagnosis present

## 2015-07-26 DIAGNOSIS — E860 Dehydration: Secondary | ICD-10-CM | POA: Diagnosis present

## 2015-07-26 DIAGNOSIS — Z79899 Other long term (current) drug therapy: Secondary | ICD-10-CM

## 2015-07-26 DIAGNOSIS — F419 Anxiety disorder, unspecified: Secondary | ICD-10-CM | POA: Diagnosis present

## 2015-07-26 DIAGNOSIS — K219 Gastro-esophageal reflux disease without esophagitis: Secondary | ICD-10-CM | POA: Diagnosis present

## 2015-07-26 DIAGNOSIS — I10 Essential (primary) hypertension: Secondary | ICD-10-CM | POA: Diagnosis present

## 2015-07-26 DIAGNOSIS — J9621 Acute and chronic respiratory failure with hypoxia: Secondary | ICD-10-CM | POA: Diagnosis present

## 2015-07-26 DIAGNOSIS — J441 Chronic obstructive pulmonary disease with (acute) exacerbation: Secondary | ICD-10-CM | POA: Diagnosis not present

## 2015-07-26 DIAGNOSIS — M549 Dorsalgia, unspecified: Secondary | ICD-10-CM

## 2015-07-26 DIAGNOSIS — G4700 Insomnia, unspecified: Secondary | ICD-10-CM | POA: Diagnosis present

## 2015-07-26 DIAGNOSIS — Z79891 Long term (current) use of opiate analgesic: Secondary | ICD-10-CM | POA: Diagnosis not present

## 2015-07-26 DIAGNOSIS — R4182 Altered mental status, unspecified: Secondary | ICD-10-CM

## 2015-07-26 DIAGNOSIS — G8929 Other chronic pain: Secondary | ICD-10-CM | POA: Diagnosis present

## 2015-07-26 DIAGNOSIS — F17210 Nicotine dependence, cigarettes, uncomplicated: Secondary | ICD-10-CM | POA: Diagnosis present

## 2015-07-26 DIAGNOSIS — M545 Low back pain: Secondary | ICD-10-CM | POA: Diagnosis present

## 2015-07-26 LAB — MAGNESIUM: Magnesium: 2.4 mg/dL (ref 1.7–2.4)

## 2015-07-26 LAB — CBC WITH DIFFERENTIAL/PLATELET
Basophils Absolute: 0 10*3/uL (ref 0–0.1)
Basophils Relative: 0 %
Eosinophils Absolute: 0 10*3/uL (ref 0–0.7)
Eosinophils Relative: 0 %
HCT: 39 % (ref 35.0–47.0)
Hemoglobin: 12.5 g/dL (ref 12.0–16.0)
Lymphocytes Relative: 4 %
Lymphs Abs: 0.8 10*3/uL — ABNORMAL LOW (ref 1.0–3.6)
MCH: 28.3 pg (ref 26.0–34.0)
MCHC: 32.1 g/dL (ref 32.0–36.0)
MCV: 88.4 fL (ref 80.0–100.0)
MONO ABS: 0.6 10*3/uL (ref 0.2–0.9)
MONOS PCT: 3 %
NEUTROS PCT: 93 %
Neutro Abs: 17.6 10*3/uL — ABNORMAL HIGH (ref 1.4–6.5)
PLATELETS: 592 10*3/uL — AB (ref 150–440)
RBC: 4.41 MIL/uL (ref 3.80–5.20)
RDW: 17.7 % — ABNORMAL HIGH (ref 11.5–14.5)
WBC: 19 10*3/uL — ABNORMAL HIGH (ref 3.6–11.0)

## 2015-07-26 LAB — BASIC METABOLIC PANEL
Anion gap: 7 (ref 5–15)
BUN: 24 mg/dL — ABNORMAL HIGH (ref 6–20)
CALCIUM: 8.5 mg/dL — AB (ref 8.9–10.3)
CO2: 26 mmol/L (ref 22–32)
Chloride: 104 mmol/L (ref 101–111)
Creatinine, Ser: 0.64 mg/dL (ref 0.44–1.00)
Glucose, Bld: 205 mg/dL — ABNORMAL HIGH (ref 65–99)
POTASSIUM: 3.9 mmol/L (ref 3.5–5.1)
SODIUM: 137 mmol/L (ref 135–145)

## 2015-07-26 LAB — MRSA PCR SCREENING: MRSA by PCR: NEGATIVE

## 2015-07-26 LAB — TROPONIN I: TROPONIN I: 0.03 ng/mL (ref <0.031)

## 2015-07-26 MED ORDER — NICOTINE 14 MG/24HR TD PT24
14.0000 mg | MEDICATED_PATCH | Freq: Every day | TRANSDERMAL | Status: DC
  Administered 2015-07-26 – 2015-08-01 (×7): 14 mg via TRANSDERMAL
  Filled 2015-07-26 (×8): qty 1

## 2015-07-26 MED ORDER — ONDANSETRON HCL 4 MG/2ML IJ SOLN
4.0000 mg | Freq: Four times a day (QID) | INTRAMUSCULAR | Status: DC | PRN
  Administered 2015-07-26: 4 mg via INTRAVENOUS
  Filled 2015-07-26: qty 2

## 2015-07-26 MED ORDER — IPRATROPIUM-ALBUTEROL 0.5-2.5 (3) MG/3ML IN SOLN
3.0000 mL | Freq: Once | RESPIRATORY_TRACT | Status: AC
  Administered 2015-07-26: 3 mL via RESPIRATORY_TRACT
  Filled 2015-07-26: qty 3

## 2015-07-26 MED ORDER — DULOXETINE HCL 30 MG PO CPEP
30.0000 mg | ORAL_CAPSULE | Freq: Every day | ORAL | Status: DC
  Administered 2015-07-26 – 2015-07-31 (×6): 30 mg via ORAL
  Filled 2015-07-26 (×8): qty 1

## 2015-07-26 MED ORDER — GUAIFENESIN ER 600 MG PO TB12
600.0000 mg | ORAL_TABLET | Freq: Two times a day (BID) | ORAL | Status: DC
  Administered 2015-07-26 – 2015-08-01 (×11): 600 mg via ORAL
  Filled 2015-07-26 (×12): qty 1

## 2015-07-26 MED ORDER — HEPARIN SODIUM (PORCINE) 5000 UNIT/ML IJ SOLN
5000.0000 [IU] | Freq: Three times a day (TID) | INTRAMUSCULAR | Status: DC
  Administered 2015-07-26 – 2015-07-31 (×14): 5000 [IU] via SUBCUTANEOUS
  Filled 2015-07-26 (×15): qty 1

## 2015-07-26 MED ORDER — DULOXETINE HCL 60 MG PO CPEP
60.0000 mg | ORAL_CAPSULE | Freq: Every morning | ORAL | Status: DC
  Administered 2015-07-27 – 2015-08-01 (×6): 60 mg via ORAL
  Filled 2015-07-26 (×5): qty 1

## 2015-07-26 MED ORDER — OXYCODONE HCL 5 MG PO TABS
5.0000 mg | ORAL_TABLET | Freq: Once | ORAL | Status: AC
  Administered 2015-07-26: 5 mg via ORAL
  Filled 2015-07-26: qty 1

## 2015-07-26 MED ORDER — MUSCLE RUB 10-15 % EX CREA
1.0000 "application " | TOPICAL_CREAM | Freq: Two times a day (BID) | CUTANEOUS | Status: DC | PRN
  Filled 2015-07-26: qty 85

## 2015-07-26 MED ORDER — OXYCODONE HCL 5 MG PO TABS
5.0000 mg | ORAL_TABLET | Freq: Four times a day (QID) | ORAL | Status: DC | PRN
  Administered 2015-07-27 – 2015-07-30 (×5): 5 mg via ORAL
  Filled 2015-07-26 (×5): qty 1

## 2015-07-26 MED ORDER — ACETAMINOPHEN 325 MG PO TABS
650.0000 mg | ORAL_TABLET | Freq: Four times a day (QID) | ORAL | Status: DC | PRN
  Administered 2015-07-30 – 2015-07-31 (×3): 650 mg via ORAL
  Filled 2015-07-26 (×3): qty 2

## 2015-07-26 MED ORDER — SODIUM CHLORIDE 0.9 % IJ SOLN: 3.0000 mL | INTRAMUSCULAR | Status: DC | PRN

## 2015-07-26 MED ORDER — ALBUTEROL SULFATE (2.5 MG/3ML) 0.083% IN NEBU
2.5000 mg | INHALATION_SOLUTION | RESPIRATORY_TRACT | Status: DC | PRN
  Administered 2015-07-27: 2.5 mg via RESPIRATORY_TRACT
  Filled 2015-07-26: qty 3

## 2015-07-26 MED ORDER — ONDANSETRON HCL 4 MG PO TABS: 4.0000 mg | ORAL_TABLET | Freq: Four times a day (QID) | ORAL | Status: DC | PRN

## 2015-07-26 MED ORDER — ALPRAZOLAM 0.25 MG PO TABS
0.2500 mg | ORAL_TABLET | Freq: Three times a day (TID) | ORAL | Status: DC | PRN
  Administered 2015-07-27 – 2015-07-28 (×3): 0.25 mg via ORAL
  Filled 2015-07-26 (×3): qty 1

## 2015-07-26 MED ORDER — TIOTROPIUM BROMIDE MONOHYDRATE 18 MCG IN CAPS
18.0000 ug | ORAL_CAPSULE | Freq: Every day | RESPIRATORY_TRACT | Status: DC
  Administered 2015-07-26 – 2015-08-01 (×7): 18 ug via RESPIRATORY_TRACT
  Filled 2015-07-26 (×3): qty 5

## 2015-07-26 MED ORDER — METHYLPREDNISOLONE SODIUM SUCC 125 MG IJ SOLR
60.0000 mg | Freq: Four times a day (QID) | INTRAMUSCULAR | Status: DC
  Administered 2015-07-26 – 2015-07-28 (×8): 60 mg via INTRAVENOUS
  Filled 2015-07-26 (×8): qty 2

## 2015-07-26 MED ORDER — SODIUM CHLORIDE 0.9 % IV SOLN: 250.0000 mL | INTRAVENOUS | Status: DC | PRN

## 2015-07-26 MED ORDER — OXYCODONE-ACETAMINOPHEN 5-325 MG PO TABS
1.0000 | ORAL_TABLET | Freq: Four times a day (QID) | ORAL | Status: DC | PRN
  Administered 2015-07-26 – 2015-07-30 (×8): 1 via ORAL
  Filled 2015-07-26 (×8): qty 1

## 2015-07-26 MED ORDER — FLUTICASONE FUROATE-VILANTEROL 100-25 MCG/INH IN AEPB: 1.0000 | INHALATION_SPRAY | Freq: Every morning | RESPIRATORY_TRACT | Status: DC

## 2015-07-26 MED ORDER — GABAPENTIN 300 MG PO CAPS
600.0000 mg | ORAL_CAPSULE | Freq: Three times a day (TID) | ORAL | Status: DC | PRN
Start: 2015-07-26 — End: 2015-08-01

## 2015-07-26 MED ORDER — METHYLPREDNISOLONE SODIUM SUCC 125 MG IJ SOLR
125.0000 mg | Freq: Once | INTRAMUSCULAR | Status: AC
  Administered 2015-07-26: 125 mg via INTRAVENOUS
  Filled 2015-07-26: qty 2

## 2015-07-26 MED ORDER — MAGNESIUM SULFATE 2 GM/50ML IV SOLN
2.0000 g | Freq: Once | INTRAVENOUS | Status: AC
  Administered 2015-07-26: 2 g via INTRAVENOUS
  Filled 2015-07-26: qty 50

## 2015-07-26 MED ORDER — SODIUM CHLORIDE 0.9 % IJ SOLN
3.0000 mL | Freq: Two times a day (BID) | INTRAMUSCULAR | Status: DC
  Administered 2015-07-26 – 2015-07-31 (×11): 3 mL via INTRAVENOUS

## 2015-07-26 MED ORDER — ACETAMINOPHEN 650 MG RE SUPP: 650.0000 mg | Freq: Four times a day (QID) | RECTAL | Status: DC | PRN

## 2015-07-26 MED ORDER — LEVALBUTEROL HCL 1.25 MG/0.5ML IN NEBU
1.2500 mg | INHALATION_SOLUTION | Freq: Four times a day (QID) | RESPIRATORY_TRACT | Status: DC
  Administered 2015-07-27 – 2015-08-01 (×22): 1.25 mg via RESPIRATORY_TRACT
  Filled 2015-07-26 (×22): qty 0.5

## 2015-07-26 MED ORDER — PANTOPRAZOLE SODIUM 40 MG PO TBEC
40.0000 mg | DELAYED_RELEASE_TABLET | Freq: Every day | ORAL | Status: DC
  Administered 2015-07-27 – 2015-08-01 (×6): 40 mg via ORAL
  Filled 2015-07-26 (×6): qty 1

## 2015-07-26 MED ORDER — TORSEMIDE 20 MG PO TABS: 10.0000 mg | ORAL_TABLET | Freq: Every day | ORAL | Status: DC | PRN

## 2015-07-26 MED ORDER — DEXTROSE 5 % IV SOLN
500.0000 mg | Freq: Once | INTRAVENOUS | Status: AC
  Administered 2015-07-26: 500 mg via INTRAVENOUS
  Filled 2015-07-26: qty 500

## 2015-07-26 MED ORDER — FUROSEMIDE 40 MG PO TABS: 40.0000 mg | ORAL_TABLET | Freq: Every day | ORAL | Status: DC | PRN

## 2015-07-26 MED ORDER — OXYCODONE-ACETAMINOPHEN 10-325 MG PO TABS: 1.0000 | ORAL_TABLET | Freq: Four times a day (QID) | ORAL | Status: DC | PRN

## 2015-07-26 MED ORDER — MOMETASONE FURO-FORMOTEROL FUM 100-5 MCG/ACT IN AERO
2.0000 | INHALATION_SPRAY | Freq: Two times a day (BID) | RESPIRATORY_TRACT | Status: DC
  Administered 2015-07-26 – 2015-08-01 (×12): 2 via RESPIRATORY_TRACT
  Filled 2015-07-26: qty 8.8

## 2015-07-26 MED ORDER — ASPIRIN EC 81 MG PO TBEC
81.0000 mg | DELAYED_RELEASE_TABLET | Freq: Every day | ORAL | Status: DC
  Administered 2015-07-26 – 2015-08-01 (×7): 81 mg via ORAL
  Filled 2015-07-26 (×7): qty 1

## 2015-07-26 MED ORDER — LEVALBUTEROL HCL 1.25 MG/0.5ML IN NEBU
1.2500 mg | INHALATION_SOLUTION | Freq: Four times a day (QID) | RESPIRATORY_TRACT | Status: DC
  Administered 2015-07-26 (×2): 1.25 mg via RESPIRATORY_TRACT
  Filled 2015-07-26 (×2): qty 0.5

## 2015-07-26 MED ORDER — BENZONATATE 100 MG PO CAPS: 100.0000 mg | ORAL_CAPSULE | Freq: Three times a day (TID) | ORAL | Status: DC | PRN

## 2015-07-26 MED ORDER — TRAZODONE HCL 50 MG PO TABS
50.0000 mg | ORAL_TABLET | Freq: Every evening | ORAL | Status: DC | PRN
  Administered 2015-07-30 – 2015-07-31 (×2): 50 mg via ORAL
  Filled 2015-07-26 (×2): qty 1

## 2015-07-26 NOTE — ED Provider Notes (Signed)
[facility] Emergency Department Provider Note   ____________________________________________  Time seen: On EMS arrival  I have reviewed the triage vital signs and the nursing notes.   HISTORY  Chief Complaint Shortness of Breath   History limited by: Not Limited   HPI Michelle Cabrera is a 69 y.o. female with history of COPD who presents to the emergency department today because of difficulty breathing. This has gotten worse over the course of the past week. It has been progressive. She has had thick productive sputum. The patient denies any fevers. Saw PCP yesterday who diagnosed her with COPD exacerbation. Prescribed levaquin and prednisone, patient states she took 20mg  of prednisone today. States she has not been taking any breathing treatments because she does not like the way they make her feel.   Past Medical History  Diagnosis Date  . Hypertension   . Closed compression fracture of L1 lumbar vertebra (HCC) 11/2012  . COPD (chronic obstructive pulmonary disease) (HCC)   . Anxiety   . Depression   . GERD (gastroesophageal reflux disease)   . Rheumatoid arthritis (HCC)   . DDD (degenerative disc disease)   . Chronic lower back pain   . Collagen vascular disease [facility])     Patient Active Problem List   Diagnosis Date Noted  . Arthritis associated with another disorder 09/15/2014  . Wedge compression fracture of T6 vertebra (HCC) 02/07/2014  . Intractable low back pain 12/27/2012  . DDD (degenerative disc disease) 12/27/2012  . Rheumatoid arthritis (HCC) 12/27/2012  . Hypertension 12/27/2012    Past Surgical History  Procedure Laterality Date  . Lumbar disc surgery       X 3  . Kyphoplasty N/A 01/04/2013    Procedure: L1 Kyphoplasty;  Surgeon: Hewitt Shorts, MD;  Location: [facility];  Service: Neurosurgery;  Laterality: N/A;  L1 Kyphoplasty  . Tubal ligation    . Foot surgery    . Wrist fusion Right     30 yrs ago  . Kyphoplasty  N/A 02/07/2014    Procedure: THORACIC SIX KYPHOPLASTY;  Surgeon: Hewitt Shorts, MD;  Location: [facility];  Service: Neurosurgery;  Laterality: N/A;  T6 Kyphoplasty   . Hand tendon surgery Right 09/15/2014    "d/t RA"  . Tonsillectomy    . Posterior lumbar fusion      "got screws in"  . Back surgery    . Cataract extraction w/ intraocular lens  implant, bilateral Bilateral   . Finger arthroplasty Right 09/15/2014    Procedure: RIGHT MIDDLE FINGER, RING FINGER, SMALL FINGER EXTENSOR DIGITORUM COMMUMIS STABILIZATION WITH RIGHT MIDDLE FINGER MCP ARTHROPLASTY, POSSIBLE RING FINGER AND SMALL FINGER MCP ARTHROPLASTY;  Surgeon: Dominica Severin, MD;  Location: [facility] OR;  Service: Orthopedics;  Laterality: Right;  . Breast biopsy Left     negative    Current Outpatient Rx  Name  Route  Sig  Dispense  Refill  . Calcium Carbonate-Vit D-Min (CALCIUM-VITAMIN D-MINERALS) 600-400 MG-UNIT CHEW   Oral   Chew 2 tablets by mouth 2 (two) times daily.          . cephALEXin (KEFLEX) 500 MG capsule   Oral   Take 1 capsule (500 mg total) by mouth 3 (three) times daily.   21 capsule   0   . DULoxetine (CYMBALTA) 30 MG capsule   Oral   Take 30 mg by mouth at bedtime.          . DULoxetine (CYMBALTA) 60 MG capsule  Oral   Take 60 mg by mouth every morning.         . etanercept (ENBREL) 50 MG/ML injection   Subcutaneous   Inject 50 mg into the skin once a week. Friday         . Fluticasone Furoate-Vilanterol (BREO ELLIPTA) 100-25 MCG/INH AEPB   Inhalation   Inhale 1 puff into the lungs every morning.         . furosemide (LASIX) 40 MG tablet   Oral   Take 40 mg by mouth daily as needed for fluid or edema.          . gabapentin (NEURONTIN) 300 MG capsule   Oral   Take 600 mg by mouth 3 (three) times daily as needed (leg pain).          Marland Kitchen ibuprofen (ADVIL,MOTRIN) 200 MG tablet   Oral   Take 400 mg by mouth every 6 (six) hours as needed for mild pain.         Marland Kitchen  ipratropium-albuterol (DUONEB) 0.5-2.5 (3) MG/3ML SOLN   Nebulization   Take 3 mLs by nebulization every 6 (six) hours as needed (SOB).          . Menthol-Methyl Salicylate (BEN GAY ARTHRITIS FORMULA) 8-30 % CREA   Apply externally   Apply 1 application topically 2 (two) times daily as needed (for back pain).          . Methotrexate, PF, 25 MG/0.4ML SOAJ   Subcutaneous   Inject 50 mg into the skin once a week. Friday         . omeprazole (PRILOSEC) 20 MG capsule   Oral   Take 20 mg by mouth 2 (two) times daily.          Marland Kitchen oxyCODONE (OXY IR/ROXICODONE) 5 MG immediate release tablet   Oral   Take 1-2 tablets (5-10 mg total) by mouth every 4 (four) hours as needed for moderate pain. Patient not taking: Reported on 02/26/2015   50 tablet   0   . oxyCODONE-acetaminophen (PERCOCET) 7.5-325 MG per tablet   Oral   Take 1 tablet by mouth every 6 (six) hours as needed for severe pain.          . predniSONE (DELTASONE) 5 MG tablet   Oral   Take 5 mg by mouth as needed (flare up).          . sulfamethoxazole-trimethoprim (BACTRIM DS) 800-160 MG tablet   Oral   Take 1 tablet by mouth 2 (two) times daily.   14 tablet   0   . torsemide (DEMADEX) 10 MG tablet   Oral   Take 10 mg by mouth daily as needed (fluid).          . traZODone (DESYREL) 50 MG tablet   Oral   Take 50-100 mg by mouth at bedtime as needed for sleep.          . vitamin C (ASCORBIC ACID) 500 MG tablet   Oral   Take 500 mg by mouth daily.         . zoledronic acid (RECLAST) 5 MG/100ML SOLN injection   Intravenous   Inject 5 mg into the vein See admin instructions. *uses once a year*           Allergies Codeine; Gold-containing drug products; Adhesive; Dilaudid; and Pravastatin  Family History  Problem Relation Age of Onset  . CAD Father   . CAD Mother   . Hypertension Mother   .  Peripheral vascular disease Mother   . Breast cancer Maternal Aunt 41    Social History Social History   Substance Use Topics  . Smoking status: Current Every Day Smoker -- 1.00 packs/day for 53 years    Types: Cigarettes  . Smokeless tobacco: Never Used  . Alcohol Use: No    Review of Systems  Constitutional: Negative for fever. Cardiovascular: Negative for chest pain. Respiratory: Positive for shortness of breath. Gastrointestinal: Negative for abdominal pain, vomiting and diarrhea. Neurological: Negative for headaches, focal weakness or numbness.  10-point ROS otherwise negative.  ____________________________________________   PHYSICAL EXAM:  VITAL SIGNS:    115   34   162/92 mmHg  98 %     Constitutional: Alert and oriented. Mild respiratory distress.  Eyes: Conjunctivae are normal. PERRL. Normal extraocular movements. ENT   Head: Normocephalic and atraumatic.   Nose: No congestion/rhinnorhea.   Mouth/Throat: Mucous membranes are moist.   Neck: No stridor. Hematological/Lymphatic/Immunilogical: No cervical lymphadenopathy. Cardiovascular: tachycardia,  regular rhythm.  No murmurs, rubs, or gallops. Respiratory: Mild respiratory distress with increased work of breathing. Diffuse wheezing. Gastrointestinal: Soft and nontender. No distention. There is no CVA tenderness. Genitourinary: Deferred Musculoskeletal: Normal range of motion in all extremities. No joint effusions.  No lower extremity tenderness nor edema. Neurologic:  Normal speech and language. No gross focal neurologic deficits are appreciated.  Skin:  Skin is warm, dry and intact. No rash noted. Psychiatric: Mood and affect are normal. Speech and behavior are normal. Patient exhibits appropriate insight and judgment.  ____________________________________________    LABS (pertinent positives/negatives)  Trop 0.03 K 3.9 Cr 0.64 WBC 19.0 Hgb 12.5  ____________________________________________   EKG  I, Phineas Semen, attending physician, personally viewed and interpreted this  EKG  EKG Time: 0834 Rate: 107 Rhythm: sinus tachycardia Axis: normal Intervals: qtc 435 QRS: narrow ST changes: no st elevation Impression: normal ekg ____________________________________________    RADIOLOGY  CXR  IMPRESSION: Stable fine reticular interstitial prominence, likely of chronic inflammatory type etiology. Specific etiology uncertain. No frank edema or consolidation. Cardiac silhouette within normal limits. Bones osteoporotic. Areas of carotid artery calcification bilaterally.  ____________________________________________   PROCEDURES  Procedure(s) performed: None  Critical Care performed: No  ____________________________________________   INITIAL IMPRESSION / ASSESSMENT AND PLAN / ED COURSE  Pertinent labs & imaging results that were available during my care of the patient were reviewed by me and considered in my medical decision making (see chart for details).  Patient presents to the emergency department today with continued shortness of breath. She has a history of COPD he was seen by primary care doctor yesterday and started on medications. The patient has not had any improvement and the patient continued to have increased respiratory effort the patient will be admitted to the hospitalist service for further workup and evaluation.  ____________________________________________   FINAL CLINICAL IMPRESSION(S) / ED DIAGNOSES  Final diagnoses:  COPD exacerbation (HCC)  Chronic back pain     Phineas Semen, MD 07/27/15 [phone]

## 2015-07-26 NOTE — H&P (Signed)
[facility] [place] at [facility]   PATIENT NAME: Michelle Cabrera    MR#:  [id number]  DATE OF BIRTH:  Sep 08, 1945  DATE OF ADMISSION:  07/26/2015  PRIMARY CARE PHYSICIAN: Lauro Regulus., MD   REQUESTING/REFERRING PHYSICIAN: Dr. Derrill Kay  CHIEF COMPLAINT:   Chief Complaint  Patient presents with  . Shortness of Breath   Cough, wheezing and shortness of breath for one week HISTORY OF PRESENT ILLNESS:  Michelle Cabrera  is a 69 y.o. female with a known history of COPD, hypertension and anxiety. She is on home oxygen as needed. She has had the cough, sputum, shortness of breath and wheezing for the past one week. Her symptoms has been worsening for the past the 2 days. She was treated with nebulizer, antibiotics and prednisone as outpatient without improvement. She denies any fever or chills but has chest tightness. No palpitation or orthopnea or nocturnal dyspnea, no leg edema. She was treated with nebulizer and magnesium in the ED without improvement. Chest x-ray didn't show pneumonia.  PAST MEDICAL HISTORY:   Past Medical History  Diagnosis Date  . Hypertension   . Closed compression fracture of L1 lumbar vertebra (HCC) 11/2012  . COPD (chronic obstructive pulmonary disease) (HCC)   . Anxiety   . Depression   . GERD (gastroesophageal reflux disease)   . Rheumatoid arthritis (HCC)   . DDD (degenerative disc disease)   . Chronic lower back pain   . Collagen vascular disease (HCC)     PAST SURGICAL HISTORY:   Past Surgical History  Procedure Laterality Date  . Lumbar disc surgery       X 3  . Kyphoplasty N/A 01/04/2013    Procedure: L1 Kyphoplasty;  Surgeon: Hewitt Shorts, MD;  Location: [facility];  Service: Neurosurgery;  Laterality: N/A;  L1 Kyphoplasty  . Tubal ligation    . Foot surgery    . Wrist fusion Right     30 yrs ago  . Kyphoplasty N/A 02/07/2014    Procedure: THORACIC SIX KYPHOPLASTY;  Surgeon: Hewitt Shorts, MD;   Location: [facility];  Service: Neurosurgery;  Laterality: N/A;  T6 Kyphoplasty   . Hand tendon surgery Right 09/15/2014    "d/t RA"  . Tonsillectomy    . Posterior lumbar fusion      "got screws in"  . Back surgery    . Cataract extraction w/ intraocular lens  implant, bilateral Bilateral   . Finger arthroplasty Right 09/15/2014    Procedure: RIGHT MIDDLE FINGER, RING FINGER, SMALL FINGER EXTENSOR DIGITORUM COMMUMIS STABILIZATION WITH RIGHT MIDDLE FINGER MCP ARTHROPLASTY, POSSIBLE RING FINGER AND SMALL FINGER MCP ARTHROPLASTY;  Surgeon: Dominica Severin, MD;  Location: [facility] OR;  Service: Orthopedics;  Laterality: Right;  . Breast biopsy Left     negative    SOCIAL HISTORY:   Social History  Substance Use Topics  . Smoking status: Current Every Day Smoker -- 1.00 packs/day for 53 years    Types: Cigarettes  . Smokeless tobacco: Never Used  . Alcohol Use: No    FAMILY HISTORY:   Family History  Problem Relation Age of Onset  . CAD Father   . CAD Mother   . Hypertension Mother   . Peripheral vascular disease Mother   . Breast cancer Maternal Aunt 50    DRUG ALLERGIES:   Allergies  Allergen Reactions  . Codeine Other (See Comments)    Other Reaction: lip tounge swell  . Gold-Containing Drug Products Anaphylaxis  .  Adhesive [Tape]     Tears skin  . Dilaudid [Hydromorphone Hcl]     Made tongue swell  . Pravastatin     Arms and legs hurt     REVIEW OF SYSTEMS:  CONSTITUTIONAL: No fever, generalized weakness.  EYES: No blurred or double vision.  EARS, NOSE, AND THROAT: No tinnitus or ear pain.  RESPIRATORY: Has cough, shortness of breath, wheezing but no hemoptysis.  CARDIOVASCULAR: No chest pain, orthopnea, edema.  GASTROINTESTINAL: No nausea, vomiting, diarrhea or abdominal pain.  GENITOURINARY: No dysuria, hematuria.  ENDOCRINE: No polyuria, nocturia,  HEMATOLOGY: No anemia, easy bruising or bleeding SKIN: No rash or lesion. MUSCULOSKELETAL: No joint pain or  arthritis.   NEUROLOGIC: No tingling, numbness, weakness.  PSYCHIATRY: No anxiety or depression.   MEDICATIONS AT HOME:   Prior to Admission medications   Medication Sig Start Date End Date Taking? Authorizing Provider  aspirin (ASPIRIN EC) 81 MG EC tablet Take 81 mg by mouth daily. Swallow whole.   Yes Historical Provider, MD  Calcium Carbonate-Vit D-Min (CALCIUM-VITAMIN D-MINERALS) 600-400 MG-UNIT CHEW Chew 2 tablets by mouth 2 (two) times daily.    Yes Historical Provider, MD  DULoxetine (CYMBALTA) 30 MG capsule Take 30 mg by mouth at bedtime.    Yes Historical Provider, MD  DULoxetine (CYMBALTA) 60 MG capsule Take 60 mg by mouth every morning.   Yes Historical Provider, MD  etanercept (ENBREL) 50 MG/ML injection Inject 50 mg into the skin once a week. Friday   Yes Historical Provider, MD  Fluticasone Furoate-Vilanterol (BREO ELLIPTA) 100-25 MCG/INH AEPB Inhale 1 puff into the lungs every morning.   Yes Historical Provider, MD  furosemide (LASIX) 40 MG tablet Take 40 mg by mouth daily as needed for fluid or edema.    Yes Historical Provider, MD  gabapentin (NEURONTIN) 300 MG capsule Take 600 mg by mouth 3 (three) times daily as needed (leg pain).    Yes Historical Provider, MD  ibuprofen (ADVIL,MOTRIN) 200 MG tablet Take 400 mg by mouth every 6 (six) hours as needed for mild pain.   Yes Historical Provider, MD  ipratropium-albuterol (DUONEB) 0.5-2.5 (3) MG/3ML SOLN Take 3 mLs by nebulization every 6 (six) hours as needed (SOB).    Yes Historical Provider, MD  Menthol-Methyl Salicylate (BEN GAY ARTHRITIS FORMULA) 8-30 % CREA Apply 1 application topically 2 (two) times daily as needed (for back pain).    Yes Historical Provider, MD  Methotrexate, PF, 25 MG/0.4ML SOAJ Inject 50 mg into the skin once a week. Friday   Yes Historical Provider, MD  omeprazole (PRILOSEC) 20 MG capsule Take 20 mg by mouth 2 (two) times daily.    Yes Historical Provider, MD  oxyCODONE-acetaminophen (PERCOCET) 10-325 MG  tablet Take 1 tablet by mouth every 6 (six) hours as needed for pain.   Yes Historical Provider, MD  predniSONE (DELTASONE) 5 MG tablet Take 10 mg by mouth as needed (flare up).    Yes Historical Provider, MD  torsemide (DEMADEX) 10 MG tablet Take 10 mg by mouth daily as needed (fluid).    Yes Historical Provider, MD  traZODone (DESYREL) 50 MG tablet Take 50-100 mg by mouth at bedtime as needed for sleep.    Yes Historical Provider, MD  vitamin C (ASCORBIC ACID) 500 MG tablet Take 500 mg by mouth daily.   Yes Historical Provider, MD  zoledronic acid (RECLAST) 5 MG/100ML SOLN injection Inject 5 mg into the vein See admin instructions. *uses once a year*   Yes Historical Provider,  MD  cephALEXin (KEFLEX) 500 MG capsule Take 1 capsule (500 mg total) by mouth 3 (three) times daily. 07/15/15   Sharman Cheek, MD  oxyCODONE (OXY IR/ROXICODONE) 5 MG immediate release tablet Take 1-2 tablets (5-10 mg total) by mouth every 4 (four) hours as needed for moderate pain. Patient not taking: Reported on 02/26/2015 09/16/14   Dominica Severin, MD  sulfamethoxazole-trimethoprim (BACTRIM DS) 800-160 MG tablet Take 1 tablet by mouth 2 (two) times daily. Patient not taking: Reported on 07/26/2015 07/15/15   Sharman Cheek, MD      VITAL SIGNS:  Blood pressure 159/75, pulse 124, resp. rate 25, SpO2 93 %.  PHYSICAL EXAMINATION:  GENERAL:  69 y.o.-year-old patient lying in the bed with acute respiratory. distress.  EYES: Pupils equal, round, reactive to light and accommodation. No scleral icterus. Extraocular muscles intact.  HEENT: Head atraumatic, normocephalic. Oropharynx and nasopharynx clear. Dry oral mucosa. NECK:  Supple, no jugular venous distention. No thyroid enlargement, no tenderness.  LUNGS: Diminished breath sounds bilaterally, severe expiratory wheezing and rhonchi. With use of accessory muscles of respiration.  CARDIOVASCULAR: S1, S2 normal. No murmurs, rubs, or gallops.  ABDOMEN: Soft, nontender,  nondistended. Bowel sounds present. No organomegaly or mass.  EXTREMITIES: No pedal edema, cyanosis, or clubbing.  NEUROLOGIC: Cranial nerves II through XII are intact. Muscle strength 5/5 in all extremities. Sensation intact. Gait not checked.  PSYCHIATRIC: The patient is alert and oriented x 3.  SKIN: No obvious rash, lesion, or ulcer. Mild bruises on extremities.  LABORATORY PANEL:   CBC  Recent Labs Lab 07/26/15 0842  WBC 19.0*  HGB 12.5  HCT 39.0  PLT 592*   ------------------------------------------------------------------------------------------------------------------  Chemistries   Recent Labs Lab 07/26/15 0842  NA 137  K 3.9  CL 104  CO2 26  GLUCOSE 205*  BUN 24*  CREATININE 0.64  CALCIUM 8.5*   ------------------------------------------------------------------------------------------------------------------  Cardiac Enzymes  Recent Labs Lab 07/26/15 0842  TROPONINI 0.03   ------------------------------------------------------------------------------------------------------------------  RADIOLOGY:  Dg Chest 2 View  07/26/2015  CLINICAL DATA:  Shortness of breath with productive cough and chest pain EXAM: CHEST  2 VIEW COMPARISON:  Chest radiograph February 26, 2015; chest CT February 26, 2015 FINDINGS: There is stable fine reticular interstitial disease diffusely. There is no airspace consolidation. Heart size and pulmonary vascularity normal. No adenopathy. Bones are osteoporotic. 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 There is postoperative change in mid lumbar region. There are foci of carotid artery calcification bilaterally. IMPRESSION: Stable fine reticular interstitial prominence, likely of chronic inflammatory type etiology. Specific etiology uncertain. No frank edema or consolidation. Cardiac silhouette within normal limits. Bones osteoporotic. Areas of carotid artery calcification bilaterally.  Electronically Signed   By: Bretta Bang III M.D.   On: 07/26/2015 09:02    EKG:   Orders placed or performed during the hospital encounter of 07/26/15  . EKG 12-Lead  . EKG 12-Lead    IMPRESSION AND PLAN:   Acute on chronic respiratory failure with hypoxia with COPD exacerbation The patient will be admitted to medical floor. I will continue IV Solu-Medrol, start Xopenex, 8 dear dulera and Spiriva. Mucinex twice a day.  Leukocytosis. Possible due to prednisone. Follow-up CBC. Dehydration. Encourage oral fluid intake. Follow up BMP. Chronic back pain. Pain control when necessary. Anxiety. Xanax When necessary. Tobacco abuse. Smoking cessation was counseled for 3 minutes, given nicotine patch.   All the records are reviewed and case discussed with ED provider. Management plans  discussed with the patient, her sister and they are in agreement.  CODE STATUS: Full code  TOTAL TIME TAKING CARE OF THIS PATIENT: 56 minutes.    Shaune Pollack M.D on 07/26/2015 at 2:25 PM  Between 7am to 6pm - Pager - [phone]  After 6pm go to [email or web address] - password [email or web address] [facility]  [place] [facility]  Office  [phone]  CC: Primary care physician; Lauro Regulus., MD

## 2015-07-26 NOTE — Progress Notes (Signed)
During assessment, RN found blisters and a few white coats under the tongue and in between her bottom lip and gum. "Pt states that the blisters on her tongue burn and the ones in between her lip and gums hurt" . MD was notified of RN's findings, new orders to swab mouth for MRSA. Also,  Oral care was placed per protocol. Will continue to monitor.  Karsten Ro

## 2015-07-26 NOTE — Progress Notes (Signed)
Pt arrived on floor. VSS.   Michelle Cabrera

## 2015-07-26 NOTE — ED Notes (Signed)
Patient transported to X-ray 

## 2015-07-26 NOTE — ED Notes (Signed)
Ems gave zofran 4 mg iv, pt states past week c/o increasing cough, sob , squeezing feeling in chest

## 2015-07-27 LAB — CBC
HEMATOCRIT: 37.3 % (ref 35.0–47.0)
HEMOGLOBIN: 12.3 g/dL (ref 12.0–16.0)
MCH: 29.1 pg (ref 26.0–34.0)
MCHC: 33 g/dL (ref 32.0–36.0)
MCV: 87.9 fL (ref 80.0–100.0)
Platelets: 556 10*3/uL — ABNORMAL HIGH (ref 150–440)
RBC: 4.24 MIL/uL (ref 3.80–5.20)
RDW: 17.9 % — ABNORMAL HIGH (ref 11.5–14.5)
WBC: 18 10*3/uL — AB (ref 3.6–11.0)

## 2015-07-27 LAB — BASIC METABOLIC PANEL
ANION GAP: 7 (ref 5–15)
BUN: 23 mg/dL — AB (ref 6–20)
CHLORIDE: 101 mmol/L (ref 101–111)
CO2: 28 mmol/L (ref 22–32)
Calcium: 8.5 mg/dL — ABNORMAL LOW (ref 8.9–10.3)
Creatinine, Ser: 0.66 mg/dL (ref 0.44–1.00)
GFR calc non Af Amer: >60 mL/min (ref >60)
Glucose, Bld: 343 mg/dL — ABNORMAL HIGH (ref 65–99)
POTASSIUM: 4.2 mmol/L (ref 3.5–5.1)
SODIUM: 136 mmol/L (ref 135–145)

## 2015-07-27 MED ORDER — DIPHENHYDRAMINE HCL 25 MG PO CAPS
25.0000 mg | ORAL_CAPSULE | Freq: Three times a day (TID) | ORAL | Status: DC | PRN
  Administered 2015-07-27: 25 mg via ORAL
  Filled 2015-07-27: qty 1

## 2015-07-27 MED ORDER — ZOLPIDEM TARTRATE 5 MG PO TABS
5.0000 mg | ORAL_TABLET | Freq: Every day | ORAL | Status: DC
  Administered 2015-07-27: 5 mg via ORAL
  Filled 2015-07-27: qty 1

## 2015-07-27 NOTE — Progress Notes (Signed)
[facility] - [place] at [facility]   PATIENT NAME: Michelle Cabrera    MR#:  [id number]  DATE OF BIRTH:  13-Jul-1946  SUBJECTIVE:  CHIEF COMPLAINT:  Patient's shortness of breath is better. She sees Dr. Meredeth Ide as an outpatient. Reporting productive cough. Couldn't sleep well last night  REVIEW OF SYSTEMS:  CONSTITUTIONAL: No fever, fatigue or weakness.  EYES: No blurred or double vision.  EARS, NOSE, AND THROAT: No tinnitus or ear pain.  RESPIRATORY: Reports productive cough, shortness of breath, wheezing , denies hemoptysis.  CARDIOVASCULAR: No chest pain, orthopnea, edema.  GASTROINTESTINAL: No nausea, vomiting, diarrhea or abdominal pain.  GENITOURINARY: No dysuria, hematuria.  ENDOCRINE: No polyuria, nocturia,  HEMATOLOGY: No anemia, easy bruising or bleeding SKIN: No rash or lesion. MUSCULOSKELETAL: No joint pain or arthritis.   NEUROLOGIC: No tingling, numbness, weakness.  PSYCHIATRY: No anxiety or depression.   DRUG ALLERGIES:   Allergies  Allergen Reactions  . Codeine Other (See Comments)    Other Reaction: lip tounge swell  . Gold-Containing Drug Products Anaphylaxis  . Adhesive [Tape]     Tears skin  . Dilaudid [Hydromorphone Hcl]     Made tongue swell  . Pravastatin     Arms and legs hurt     VITALS:  Blood pressure 129/75, pulse 114, temperature 98.4 F (36.9 C), temperature source Oral, resp. rate 18, height 5\' 2"  (1.575 m), weight 69.4 kg (153 lb), SpO2 95 %.  PHYSICAL EXAMINATION:  GENERAL:  69 y.o.-year-old patient lying in the bed with no acute distress.  EYES: Pupils equal, round, reactive to light and accommodation. No scleral icterus. Extraocular muscles intact.  HEENT: Head atraumatic, normocephalic. Oropharynx and nasopharynx clear.  NECK:  Supple, no jugular venous distention. No thyroid enlargement, no tenderness.  LUNGS: Coarse bronchial breath sounds bilaterally, minimal wheezing, rales,rhonchi or crepitation. No use  of accessory muscles of respiration.  CARDIOVASCULAR: S1, S2 normal. No murmurs, rubs, or gallops.  ABDOMEN: Soft, nontender, nondistended. Bowel sounds present. No organomegaly or mass.  EXTREMITIES: No pedal edema, cyanosis, or clubbing.  NEUROLOGIC: Cranial nerves II through XII are intact. Muscle strength 5/5 in all extremities. Sensation intact. Gait not checked.  PSYCHIATRIC: The patient is alert and oriented x 3.  SKIN: No obvious rash, lesion, or ulcer.    LABORATORY PANEL:   CBC  Recent Labs Lab 07/27/15 0428  WBC 18.0*  HGB 12.3  HCT 37.3  PLT 556*   ------------------------------------------------------------------------------------------------------------------  Chemistries   Recent Labs Lab 07/26/15 1651 07/27/15 0428  NA  --  136  K  --  4.2  CL  --  101  CO2  --  28  GLUCOSE  --  343*  BUN  --  23*  CREATININE  --  0.66  CALCIUM  --  8.5*  MG 2.4  --    ------------------------------------------------------------------------------------------------------------------  Cardiac Enzymes  Recent Labs Lab 07/26/15 0842  TROPONINI 0.03   ------------------------------------------------------------------------------------------------------------------  RADIOLOGY:  Dg Chest 2 View  07/26/2015  CLINICAL DATA:  Shortness of breath with productive cough and chest pain EXAM: CHEST  2 VIEW COMPARISON:  Chest radiograph February 26, 2015; chest CT February 26, 2015 FINDINGS: There is stable fine reticular interstitial disease diffusely. There is no airspace consolidation. Heart size and pulmonary vascularity normal. No adenopathy. Bones are osteoporotic. 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 There is postoperative change in mid lumbar region. There are foci of carotid artery calcification bilaterally.  IMPRESSION: Stable fine reticular interstitial prominence, likely of chronic inflammatory type etiology. Specific  etiology uncertain. No frank edema or consolidation. Cardiac silhouette within normal limits. Bones osteoporotic. Areas of carotid artery calcification bilaterally. Electronically Signed   By: Bretta Bang III M.D.   On: 07/26/2015 09:02    EKG:   Orders placed or performed during the hospital encounter of 07/26/15  . EKG 12-Lead  . EKG 12-Lead    ASSESSMENT AND PLAN:   1. Acute on chronic hypoxic respiratory failure secondary to COPD exacerbation Clinically better than yesterday  continue IV Solu-Medrol,  Xopenex,  dulera and Spiriva. Mucinex twice a day. Consult Dr. Meredeth Ide as patient sees Dr. Meredeth Ide, pulmonology as an outpatient  2. Leukocytosis. Possible due to prednisone. Follow-up CBC.  3. Dehydration. Encourage oral fluid intake. Follow up BMP.  4.Chronic back pain. Pain control when necessary.  5,Anxiety. Xanax When necessary.  6.Tobacco abuse. Smoking cessation  Advised, continue nicotine patch    7. Insomnia-Ambien daily at bedtime as needed     All the records are reviewed and case discussed with Care Management/Social Workerr. Management plans discussed with the patient, family and they are in agreement.  CODE STATUS: fc  TOTAL TIME TAKING CARE OF THIS PATIENT: 35 minutes.   POSSIBLE D/C IN 2-3  DAYS, DEPENDING ON CLINICAL CONDITION.   Ramonita Lab M.D on 07/27/2015 at 1:37 PM  Between 7am to 6pm - Pager - [phone] After 6pm go to [email or web address] - password [email or web address] [facility]  [place] [facility]  Office  [phone]  CC: Primary care physician; Lauro Regulus., MD

## 2015-07-28 LAB — CBC
HCT: 38.1 % (ref 35.0–47.0)
Hemoglobin: 12.5 g/dL (ref 12.0–16.0)
MCH: 29.6 pg (ref 26.0–34.0)
MCHC: 32.9 g/dL (ref 32.0–36.0)
MCV: 89.8 fL (ref 80.0–100.0)
PLATELETS: 506 10*3/uL — AB (ref 150–440)
RBC: 4.24 MIL/uL (ref 3.80–5.20)
RDW: 18.4 % — AB (ref 11.5–14.5)
WBC: 18.8 10*3/uL — ABNORMAL HIGH (ref 3.6–11.0)

## 2015-07-28 LAB — BASIC METABOLIC PANEL
Anion gap: 8 (ref 5–15)
BUN: 26 mg/dL — AB (ref 6–20)
CALCIUM: 8.5 mg/dL — AB (ref 8.9–10.3)
CO2: 29 mmol/L (ref 22–32)
CREATININE: 0.61 mg/dL (ref 0.44–1.00)
Chloride: 101 mmol/L (ref 101–111)
GFR calc Af Amer: >60 mL/min (ref >60)
GLUCOSE: 384 mg/dL — AB (ref 65–99)
Potassium: 4 mmol/L (ref 3.5–5.1)
SODIUM: 138 mmol/L (ref 135–145)

## 2015-07-28 LAB — GLUCOSE, CAPILLARY
GLUCOSE-CAPILLARY: 338 mg/dL — AB (ref 65–99)
GLUCOSE-CAPILLARY: 352 mg/dL — AB (ref 65–99)
Glucose-Capillary: 239 mg/dL — ABNORMAL HIGH (ref 65–99)

## 2015-07-28 MED ORDER — METHYLPREDNISOLONE SODIUM SUCC 40 MG IJ SOLR
40.0000 mg | Freq: Two times a day (BID) | INTRAMUSCULAR | Status: DC
  Administered 2015-07-28 – 2015-07-31 (×6): 40 mg via INTRAVENOUS
  Filled 2015-07-28 (×6): qty 1

## 2015-07-28 MED ORDER — DIPHENHYDRAMINE HCL 25 MG PO CAPS
25.0000 mg | ORAL_CAPSULE | Freq: Four times a day (QID) | ORAL | Status: DC | PRN
  Administered 2015-07-28 – 2015-07-31 (×4): 25 mg via ORAL
  Filled 2015-07-28 (×4): qty 1

## 2015-07-28 MED ORDER — NYSTATIN 100000 UNIT/ML MT SUSP
5.0000 mL | Freq: Four times a day (QID) | OROMUCOSAL | Status: DC
  Administered 2015-07-28 – 2015-08-01 (×16): 500000 [IU] via OROMUCOSAL
  Filled 2015-07-28 (×17): qty 5

## 2015-07-28 MED ORDER — LEVOFLOXACIN IN D5W 500 MG/100ML IV SOLN
500.0000 mg | INTRAVENOUS | Status: DC
  Administered 2015-07-28 – 2015-07-30 (×3): 500 mg via INTRAVENOUS
  Filled 2015-07-28 (×4): qty 100

## 2015-07-28 MED ORDER — INSULIN ASPART 100 UNIT/ML ~~LOC~~ SOLN
0.0000 [IU] | Freq: Three times a day (TID) | SUBCUTANEOUS | Status: DC
  Administered 2015-07-28: 9 [IU] via SUBCUTANEOUS
  Administered 2015-07-29: 3 [IU] via SUBCUTANEOUS
  Administered 2015-07-29: 5 [IU] via SUBCUTANEOUS
  Administered 2015-07-29: 7 [IU] via SUBCUTANEOUS
  Administered 2015-07-30 – 2015-07-31 (×4): 3 [IU] via SUBCUTANEOUS
  Administered 2015-07-31: 5 [IU] via SUBCUTANEOUS
  Administered 2015-07-31 – 2015-08-01 (×2): 2 [IU] via SUBCUTANEOUS
  Filled 2015-07-28: qty 5
  Filled 2015-07-28: qty 2
  Filled 2015-07-28: qty 3
  Filled 2015-07-28: qty 7
  Filled 2015-07-28: qty 5
  Filled 2015-07-28: qty 3
  Filled 2015-07-28: qty 2
  Filled 2015-07-28: qty 3
  Filled 2015-07-28: qty 9
  Filled 2015-07-28: qty 3
  Filled 2015-07-28: qty 2
  Filled 2015-07-28: qty 3

## 2015-07-28 MED ORDER — INSULIN ASPART 100 UNIT/ML ~~LOC~~ SOLN
0.0000 [IU] | Freq: Every day | SUBCUTANEOUS | Status: DC
  Administered 2015-07-28 – 2015-07-31 (×3): 2 [IU] via SUBCUTANEOUS
  Filled 2015-07-28: qty 3
  Filled 2015-07-28 (×2): qty 2

## 2015-07-28 NOTE — Evaluation (Signed)
Physical Therapy Evaluation Patient Details Name: Michelle Cabrera MRN: [id number] DOB: 08-26-1945 Today's Date: 07/28/2015   History of Present Illness  Patient is a pleasant 69 y/o female that presents with cough and productive sputum/shortness of breath. Patient on O2 at home at 2L as needed.   Clinical Impression  Patient reports a history of chronic low back pain which limits her mobility in general. She is still able to ambulate community distances with no AD and reports no falls. She displays decreased endurance during this session, but reports her ambulation quality/balance are similar to her baseline. Today she is limited in her ability to ambulate continuously without rest breaks, she furniture cruises when possible and was recommended to use SPC as able to assist with mobility. Skilled acute PT services are indicated to address the above mobility deficits.     Follow Up Recommendations Home health PT    Equipment Recommendations  Cane    Recommendations for Other Services       Precautions / Restrictions Restrictions Weight Bearing Restrictions: No      Mobility  Bed Mobility Overal bed mobility: Independent             General bed mobility comments: No deficits in bed mobility.   Transfers Overall transfer level: Independent               General transfer comment: No balance deficits in transfer, minimal if any use of UEs.   Ambulation/Gait Ambulation/Gait assistance: Min guard Ambulation Distance (Feet): 200 Feet Assistive device: None Gait Pattern/deviations: Decreased step length - left;Decreased step length - right;Trunk flexed   Gait velocity interpretation: Below normal speed for age/gender General Gait Details: Patient demonstrates decreased gait speed and frequent rest breaks secondary to dyspnea, no loss of balance or drifting noted.   Stairs            Wheelchair Mobility    Modified Rankin (Stroke Patients Only)        Balance Overall balance assessment: Needs assistance   Sitting balance-Leahy Scale: Good       Standing balance-Leahy Scale: Fair Standing balance comment: Modified DGI of 9/12, recommended use of SPC for future use as patient appeared to furniture cruise when able.                              Pertinent Vitals/Pain Pain Assessment:  (Reports chronic low back pain, does not rate but states it is around her normal. )    Home Living Family/patient expects to be discharged to:: Private residence Living Arrangements: Spouse/significant other Available Help at Discharge: Family;Available 24 hours/day Type of Home: House Home Access: Ramped entrance     Home Layout: One level Home Equipment: Walker - 2 wheels;Shower seat;Cane - single point      Prior Function Level of Independence: Independent         Comments: Patient reports she is able to ambulate in the community with no AD.      Hand Dominance   Dominant Hand: Right    Extremity/Trunk Assessment   Upper Extremity Assessment: Overall WFL for tasks assessed           Lower Extremity Assessment: Generalized weakness         Communication   Communication: No difficulties  Cognition Arousal/Alertness: Awake/alert Behavior During Therapy: WFL for tasks assessed/performed Overall Cognitive Status: Within Functional Limits for tasks assessed  General Comments      Exercises        Assessment/Plan    PT Assessment Patient needs continued PT services  PT Diagnosis Difficulty walking;Generalized weakness   PT Problem List Decreased strength;Decreased knowledge of use of DME;Decreased activity tolerance;Cardiopulmonary status limiting activity;Decreased balance;Decreased mobility  PT Treatment Interventions Gait training;DME instruction;Stair training;Therapeutic activities;Therapeutic exercise;Balance training   PT Goals (Current goals can be found in the Care  Plan section) Acute Rehab PT Goals Patient Stated Goal: To return home  PT Goal Formulation: With patient Time For Goal Achievement: 08/11/15 Potential to Achieve Goals: Good    Frequency Min 2X/week   Barriers to discharge        Co-evaluation               End of Session Equipment Utilized During Treatment: Gait belt Activity Tolerance: Patient tolerated treatment well;Patient limited by fatigue Patient left: in bed;with call bell/phone within reach;with bed alarm set Nurse Communication: Mobility status         Time: 2297-9892 PT Time Calculation (min) (ACUTE ONLY): 11 min   Charges:   PT Evaluation $Initial PT Evaluation Tier I: 1 Procedure     PT G Codes:       Kerin Ransom, PT, DPT    07/28/2015, 4:58 PM

## 2015-07-28 NOTE — Care Management Important Message (Signed)
Important Message  Patient Details  Name: Michelle Cabrera MRN: [id number] Date of Birth: 1945-09-10   Medicare Important Message Given:  Yes    [name] Pasty Manninen 07/28/2015, 10:19 AM

## 2015-07-28 NOTE — Progress Notes (Signed)
Inpatient Diabetes Program Recommendations  AACE/ADA: New Consensus Statement on Inpatient Glycemic Control (2015)  Target Ranges:  Prepandial:   less than 140 mg/dL      Peak postprandial:   less than 180 mg/dL (1-2 hours)      Critically ill patients:  140 - 180 mg/dL   Review of Glycemic Control  Results for MODENE, ANDY (MRN [id number]) as of 07/28/2015 09:08  Ref. Range 07/28/2015 07:43  Glucose-Capillary Latest Ref Range: 65-99 mg/dL 314 (H)   Diabetes history: none documented Outpatient Diabetes medications: none Current orders for Inpatient glycemic control: none  Inpatient Diabetes Program Recommendations: Consider checking an A1C  (Per ADA recommendations "consider performing an A1C on all patients with diabetes or hyperglycemia admitted to the hospital if not performed in the prior 3 months".) and ordering blood sugar checks tid and hs.   Consider ordering Novolog moderate correction insulin 0-15 units tid with meals and Novolog 0-5 units qhs while patient is on steroids- d/c when steroids are discontinued  Please order a carb modified/heart healthy diet if appropriate.   Susette Racer, RN, BA, MHA, CDE Diabetes Coordinator Inpatient Diabetes Program  [phone] (Team Pager) [phone] [facility] Office) 07/28/2015 9:10 AM

## 2015-07-28 NOTE — Progress Notes (Signed)
[facility] - [place] at [facility]   PATIENT NAME: Michelle Cabrera    MR#:  [id number]  DATE OF BIRTH:  1945-12-07  SUBJECTIVE:  CHIEF COMPLAINT:  Patient's feeling sick, wheezing little. Reporting productive cough.   REVIEW OF SYSTEMS:  CONSTITUTIONAL: No fever, fatigue or weakness.  EYES: No blurred or double vision.  EARS, NOSE, AND THROAT: No tinnitus or ear pain.  RESPIRATORY: Reports productive cough,  Improved shortness of breath,  Some  wheezing , denies hemoptysis.  CARDIOVASCULAR: No chest pain, orthopnea, edema.  GASTROINTESTINAL: No nausea, vomiting, diarrhea or abdominal pain.  GENITOURINARY: No dysuria, hematuria.  ENDOCRINE: No polyuria, nocturia,  HEMATOLOGY: No anemia, easy bruising or bleeding SKIN: No rash or lesion. MUSCULOSKELETAL: No joint pain or arthritis.   NEUROLOGIC: No tingling, numbness, weakness.  PSYCHIATRY: No anxiety or depression.   DRUG ALLERGIES:   Allergies  Allergen Reactions  . Codeine Other (See Comments)    Other Reaction: lip tounge swell  . Gold-Containing Drug Products Anaphylaxis  . Adhesive [Tape]     Tears skin  . Dilaudid [Hydromorphone Hcl]     Made tongue swell  . Pravastatin     Arms and legs hurt     VITALS:  Blood pressure 142/83, pulse 104, temperature 98 F (36.7 C), temperature source Oral, resp. rate 18, height 5\' 2"  (1.575 m), weight 69.4 kg (153 lb), SpO2 93 %.  PHYSICAL EXAMINATION:  GENERAL:  69 y.o.-year-old patient lying in the bed with no acute distress.  EYES: Pupils equal, round, reactive to light and accommodation. No scleral icterus. Extraocular muscles intact.  HEENT: Head atraumatic, normocephalic. Oropharynx and nasopharynx clear.  NECK:  Supple, no jugular venous distention. No thyroid enlargement, no tenderness.  LUNGS: Coarse bronchial breath sounds bilaterally, minimal wheezing, rales,rhonchi or crepitation. No use of accessory muscles of respiration.  CARDIOVASCULAR:  S1, S2 normal. No murmurs, rubs, or gallops.  ABDOMEN: Soft, nontender, nondistended. Bowel sounds present. No organomegaly or mass.  EXTREMITIES: No pedal edema, cyanosis, or clubbing.  NEUROLOGIC: Cranial nerves II through XII are intact. Muscle strength 5/5 in all extremities. Sensation intact. Gait not checked.  PSYCHIATRIC: The patient is alert and oriented x 3.  SKIN: No obvious rash, lesion, or ulcer.    LABORATORY PANEL:   CBC  Recent Labs Lab 07/28/15 0433  WBC 18.8*  HGB 12.5  HCT 38.1  PLT 506*   ------------------------------------------------------------------------------------------------------------------  Chemistries   Recent Labs Lab 07/26/15 1651  07/28/15 0433  NA  --   < > 138  K  --   < > 4.0  CL  --   < > 101  CO2  --   < > 29  GLUCOSE  --   < > 384*  BUN  --   < > 26*  CREATININE  --   < > 0.61  CALCIUM  --   < > 8.5*  MG 2.4  --   --   < > = values in this interval not displayed. ------------------------------------------------------------------------------------------------------------------  Cardiac Enzymes  Recent Labs Lab 07/26/15 0842  TROPONINI 0.03   ------------------------------------------------------------------------------------------------------------------  RADIOLOGY:  No results found.  EKG:   Orders placed or performed during the hospital encounter of 07/26/15  . EKG 12-Lead  . EKG 12-Lead    ASSESSMENT AND PLAN:   1. Acute on chronic hypoxic respiratory failure secondary to COPD exacerbation   continue tapering IV Solu-Medrol,  Xopenex,  dulera and Spiriva. Mucinex twice a day. Levofloxacin is added  to the regimen Consult Dr. Meredeth Ide as patient sees Dr. Meredeth Ide, pulmonology as an outpatient  2. Leukocytosis. Possible due to prednisone. WBC at 18.8 today.  3. Dehydration. Encourage oral fluid intake. Creatinine is back to normal  4.Chronic back pain. Pain control when necessary.  5,Anxiety. Xanax When  necessary.  6.Tobacco abuse. Smoking cessation  Advised, continue nicotine patch    7. Insomnia-Ambien didn't help with the sleep. Patient is requesting Benadryl will provide Benadryl 25 mg by mouth every 6 hours as needed    8. Deconditioning with physical therapy consult placed    All the records are reviewed and case discussed with Care Management/Social Workerr. Management plans discussed with the patient, family and they are in agreement.  CODE STATUS: fc  TOTAL TIME TAKING CARE OF THIS PATIENT: 35 minutes.   POSSIBLE D/C IN 2-3  DAYS, DEPENDING ON CLINICAL CONDITION.   Ramonita Lab M.D on 07/28/2015 at 12:34 PM  Between 7am to 6pm - Pager - [phone] After 6pm go to [email or web address] - password [email or web address] [facility]  [place] [facility]  Office  [phone]  CC: Primary care physician; Lauro Regulus., MD

## 2015-07-29 ENCOUNTER — Inpatient Hospital Stay: Payer: Medicare Other

## 2015-07-29 LAB — GLUCOSE, CAPILLARY
GLUCOSE-CAPILLARY: 336 mg/dL — AB (ref 65–99)
GLUCOSE-CAPILLARY: 202 mg/dL — AB (ref 65–99)
Glucose-Capillary: 270 mg/dL — ABNORMAL HIGH (ref 65–99)
Glucose-Capillary: 210 mg/dL — ABNORMAL HIGH (ref 65–99)

## 2015-07-29 LAB — HEMOGLOBIN A1C: Hgb A1c MFr Bld: 8 % — ABNORMAL HIGH (ref 4.0–6.0)

## 2015-07-29 MED ORDER — SENNOSIDES-DOCUSATE SODIUM 8.6-50 MG PO TABS
1.0000 | ORAL_TABLET | Freq: Two times a day (BID) | ORAL | Status: DC
  Administered 2015-07-29 – 2015-08-01 (×7): 1 via ORAL
  Filled 2015-07-29 (×8): qty 1

## 2015-07-29 NOTE — Progress Notes (Signed)
[facility] Physicians - [place] at [facility]   PATIENT NAME: Michelle Cabrera    MR#:  [id number]  DATE OF BIRTH:  08/25/1945  SUBJECTIVE:  CHIEF COMPLAINT:  Patient's feeling sick during my examination, lethargic and seemed to be confused intermittently with dysarthria per daughter. Reporting productive cough.   REVIEW OF SYSTEMS:  CONSTITUTIONAL: No fever, fatigue or weakness.  EYES: No blurred or double vision.  EARS, NOSE, AND THROAT: No tinnitus or ear pain.  RESPIRATORY: Reports productive cough,  Improved shortness of breath,  Some  wheezing , denies hemoptysis.  CARDIOVASCULAR: No chest pain, orthopnea, edema.  GASTROINTESTINAL: No nausea, vomiting, diarrhea or abdominal pain.  GENITOURINARY: No dysuria, hematuria.  ENDOCRINE: No polyuria, nocturia,  HEMATOLOGY: No anemia, easy bruising or bleeding SKIN: No rash or lesion. MUSCULOSKELETAL: No joint pain or arthritis.   NEUROLOGIC: No tingling, numbness, weakness.  PSYCHIATRY: No anxiety or depression.   DRUG ALLERGIES:   Allergies  Allergen Reactions  . Codeine Other (See Comments)    Other Reaction: lip tounge swell  . Gold-Containing Drug Products Anaphylaxis  . Adhesive [Tape]     Tears skin  . Dilaudid [Hydromorphone Hcl]     Made tongue swell  . Pravastatin     Arms and legs hurt     VITALS:  Blood pressure 134/91, pulse 101, temperature 98.2 F (36.8 C), temperature source Oral, resp. rate 22, height 5\' 2"  (1.575 m), weight 69.4 kg (153 lb), SpO2 95 %.  PHYSICAL EXAMINATION:  GENERAL:  69 y.o.-year-old patient lying in the bed with no acute distress.  EYES: Pupils equal, round, reactive to light and accommodation. No scleral icterus. Extraocular muscles intact.  HEENT: Head atraumatic, normocephalic. Oropharynx and nasopharynx clear.  NECK:  Supple, no jugular venous distention. No thyroid enlargement, no tenderness.  LUNGS: Coarse bronchial breath sounds bilaterally, minimal end expiratory  wheezing, rales,rhonchi or crepitation. No use of accessory muscles of respiration.  CARDIOVASCULAR: S1, S2 normal. No murmurs, rubs, or gallops.  ABDOMEN: Soft, nontender, nondistended. Bowel sounds present. No organomegaly or mass.  EXTREMITIES: No pedal edema, cyanosis, or clubbing.  NEUROLOGIC: Cranial nerves II through XII are intact. Muscle strength at her baseline. Sensation intact. Gait not checked.  PSYCHIATRIC: The patient is alert and oriented x 3.  SKIN: No obvious rash, lesion, or ulcer.    LABORATORY PANEL:   CBC  Recent Labs Lab 07/28/15 0433  WBC 18.8*  HGB 12.5  HCT 38.1  PLT 506*   ------------------------------------------------------------------------------------------------------------------  Chemistries   Recent Labs Lab 07/26/15 1651  07/28/15 0433  NA  --   < > 138  K  --   < > 4.0  CL  --   < > 101  CO2  --   < > 29  GLUCOSE  --   < > 384*  BUN  --   < > 26*  CREATININE  --   < > 0.61  CALCIUM  --   < > 8.5*  MG 2.4  --   --   < > = values in this interval not displayed. ------------------------------------------------------------------------------------------------------------------  Cardiac Enzymes  Recent Labs Lab 07/26/15 0842  TROPONINI 0.03   ------------------------------------------------------------------------------------------------------------------  RADIOLOGY:  No results found.  EKG:   Orders placed or performed during the hospital encounter of 07/26/15  . EKG 12-Lead  . EKG 12-Lead    ASSESSMENT AND PLAN:   1. Acute on chronic hypoxic respiratory failure secondary to COPD exacerbation continue tapering IV Solu-Medrol,  Xopenex,  dulera and Spiriva. Mucinex twice a day. Levofloxacin is added to the regimen Consult Dr. Meredeth Ide as patient sees Dr. Meredeth Ide, pulmonology as an outpatient  2. Altered mental status with dysarthria-per daughter-will get CT head and monitor patient closely Will get neuro checks and  provide aspirin  3. Dehydration. Encourage oral fluid intake. Creatinine is back to normal  4.Chronic back pain. Pain control when necessary. Continue back brace  5,Anxiety. Xanax When necessary.  6.Tobacco abuse. Smoking cessation  Advised, continue nicotine patch    7. Insomnia-Ambien didn't help with the sleep. Patient is requesting Benadryl will provide Benadryl 25 mg by mouth every 12 hours as needed    8. Deconditioning with physical therapy consult placed    All the records are reviewed and case discussed with Care Management/Social Workerr. Management plans discussed with the patient, family and they are in agreement.  CODE STATUS: fc  TOTAL TIME TAKING CARE OF THIS PATIENT: 35 minutes.   POSSIBLE D/C IN 2-3  DAYS, DEPENDING ON CLINICAL CONDITION.   Ramonita Lab M.D on 07/29/2015 at 2:14 PM  Between 7am to 6pm - Pager - [phone] After 6pm go to [email or web address] - password [email or web address] [facility]  [place] [facility]  Office  [phone]  CC: Primary care physician; Lauro Regulus., MD

## 2015-07-29 NOTE — Progress Notes (Signed)
Pt requested something to assist with BM. Dr. Amado Coe notified and MD to place orders.

## 2015-07-30 LAB — URINALYSIS COMPLETE WITH MICROSCOPIC (ARMC ONLY)
BACTERIA UA: NONE SEEN
Bilirubin Urine: NEGATIVE
Glucose, UA: >500 mg/dL — AB
Hgb urine dipstick: NEGATIVE
NITRITE: NEGATIVE
PH: 7 (ref 5.0–8.0)
PROTEIN: NEGATIVE mg/dL
SPECIFIC GRAVITY, URINE: 1.021 (ref 1.005–1.030)

## 2015-07-30 LAB — GLUCOSE, CAPILLARY
GLUCOSE-CAPILLARY: 229 mg/dL — AB (ref 65–99)
GLUCOSE-CAPILLARY: 135 mg/dL — AB (ref 65–99)
Glucose-Capillary: 226 mg/dL — ABNORMAL HIGH (ref 65–99)
Glucose-Capillary: 245 mg/dL — ABNORMAL HIGH (ref 65–99)

## 2015-07-30 MED ORDER — OXYCODONE HCL 5 MG PO TABS
5.0000 mg | ORAL_TABLET | ORAL | Status: DC | PRN
  Administered 2015-07-30 – 2015-07-31 (×7): 5 mg via ORAL
  Administered 2015-08-01 (×2): 10 mg via ORAL
  Filled 2015-07-30 (×3): qty 1
  Filled 2015-07-30 (×2): qty 2
  Filled 2015-07-30 (×4): qty 1

## 2015-07-30 MED ORDER — OXYCODONE HCL 5 MG PO TABS: 10.0000 mg | ORAL_TABLET | Freq: Four times a day (QID) | ORAL | Status: DC | PRN

## 2015-07-30 MED ORDER — GUAIFENESIN ER 600 MG PO TB12: 600.0000 mg | ORAL_TABLET | Freq: Two times a day (BID) | ORAL | Status: DC

## 2015-07-30 MED ORDER — OXYCODONE-ACETAMINOPHEN 5-325 MG PO TABS
0.5000 | ORAL_TABLET | Freq: Three times a day (TID) | ORAL | Status: DC | PRN
  Administered 2015-07-30: 0.5 via ORAL
  Filled 2015-07-30: qty 1

## 2015-07-30 NOTE — Progress Notes (Signed)
Pain medications were changed by MD this morning due to family being concerned the patient has been more sleepy the past few days. After talking with the patient and her daughter, they informed me that the patient has been taking 10mg  of oxycodone every 6 hours at home for the last 6 years and patient does not struggle with drowsiness at home quite as much. Blood gas drawn and CO2 level was fine. Patient started crying stating "I'm just going to go home if I can't get what I need here". She is requesting pain medication now. Paged MD to see if the pain medication could be switched to just oxycodone every 6 hours like she does at home. MD stated he will only allow half a tablet of percocet right now since patient has been so drowsy, and okay to give a dose of it now. Will give and continue to monitor patient's pain.

## 2015-07-30 NOTE — Progress Notes (Signed)
[facility] Phys[facility] - [place] at [facility]   PATIENT NAME: Michelle Cabrera    MR#:  [id number]  DATE OF BIRTH:  06-Oct-1945  SUBJECTIVE:  CHIEF COMPLAINT:  Family is concerned still patient being sleepy and not being normal CT scan of the head done yesterday was negative patient able to answer, questions  REVIEW OF SYSTEMS:  CONSTITUTIONAL: No fever, fatigue or weakness.  EYES: No blurred or double vision.  EARS, NOSE, AND THROAT: No tinnitus or ear pain.  RESPIRATORY: Reports productive cough,  Improved shortness of breath,  Some  wheezing , denies hemoptysis.  CARDIOVASCULAR: No chest pain, orthopnea, edema.  GASTROINTESTINAL: No nausea, vomiting, diarrhea or abdominal pain.  GENITOURINARY: No dysuria, hematuria.  ENDOCRINE: No polyuria, nocturia,  HEMATOLOGY: No anemia, easy bruising or bleeding SKIN: No rash or lesion. MUSCULOSKELETAL: No joint pain or arthritis.   NEUROLOGIC: No tingling, numbness, weakness.  PSYCHIATRY: No anxiety or depression.   DRUG ALLERGIES:   Allergies  Allergen Reactions  . Codeine Other (See Comments)    Other Reaction: lip tounge swell  . Gold-Containing Drug Products Anaphylaxis  . Adhesive [Tape]     Tears skin  . Dilaudid [Hydromorphone Hcl]     Made tongue swell  . Pravastatin     Arms and legs hurt     VITALS:  Blood pressure 161/60, pulse 95, temperature 98 F (36.7 C), temperature source Oral, resp. rate 16, height 5\' 2"  (1.575 m), weight 69.4 kg (153 lb), SpO2 96 %.  PHYSICAL EXAMINATION:  GENERAL:  70 y.o.-year-old patient lying in the bed with no acute distress.  EYES: Pupils equal, round, reactive to light and accommodation. No scleral icterus. Extraocular muscles intact.  HEENT: Head atraumatic, normocephalic. Oropharynx and nasopharynx clear.  NECK:  Supple, no jugular venous distention. No thyroid enlargement, no tenderness.  LUNGS: Coarse bronchial breath sounds bilaterally, minimal end expiratory wheezing,  rales,rhonchi or crepitation. No use of accessory muscles of respiration.  CARDIOVASCULAR: S1, S2 normal. No murmurs, rubs, or gallops.  ABDOMEN: Soft, nontender, nondistended. Bowel sounds present. No organomegaly or mass.  EXTREMITIES: No pedal edema, cyanosis, or clubbing.  NEUROLOGIC: Cranial nerves II through XII are intact. Muscle strength at her baseline. Sensation intact. Gait not checked.  PSYCHIATRIC: The patient is alert and oriented x 3.  SKIN: No obvious rash, lesion, or ulcer.    LABORATORY PANEL:   CBC  Recent Labs Lab 07/28/15 0433  WBC 18.8*  HGB 12.5  HCT 38.1  PLT 506*   ------------------------------------------------------------------------------------------------------------------  Chemistries   Recent Labs Lab 07/26/15 1651  07/28/15 0433  NA  --   < > 138  K  --   < > 4.0  CL  --   < > 101  CO2  --   < > 29  GLUCOSE  --   < > 384*  BUN  --   < > 26*  CREATININE  --   < > 0.61  CALCIUM  --   < > 8.5*  MG 2.4  --   --   < > = values in this interval not displayed. ------------------------------------------------------------------------------------------------------------------  Cardiac Enzymes  Recent Labs Lab 07/26/15 0842  TROPONINI 0.03   ------------------------------------------------------------------------------------------------------------------  RADIOLOGY:  Ct Head Wo Contrast  07/29/2015  CLINICAL DATA:  70 year old female with altered mental status and confusion. EXAM: CT HEAD WITHOUT CONTRAST TECHNIQUE: Contiguous axial images were obtained from the base of the skull through the vertex without intravenous contrast. COMPARISON:  07/24/2009 CT FINDINGS:  Atrophy and chronic small-vessel white matter ischemic changes are again noted. No acute intracranial abnormalities are identified, including mass lesion or mass effect, hydrocephalus, extra-axial fluid collection, midline shift, hemorrhage, or acute infarction. Small amount of  fluid within the sphenoid and ethmoid sinuses noted. The visualized bony calvarium is unremarkable. IMPRESSION: No evidence of acute intracranial abnormality. Atrophy and chronic small-vessel white matter ischemic changes. Small amount of fluid within ethmoid and sphenoid sinuses and may represent acute sinusitis. Electronically Signed   By: Harmon Pier M.D.   On: 07/29/2015 14:50    EKG:   Orders placed or performed during the hospital encounter of 07/26/15  . EKG 12-Lead  . EKG 12-Lead    ASSESSMENT AND PLAN:   1. Acute on chronic hypoxic respiratory failure secondary to COPD exacerbation continue tapering IV Solu-Medrol,  Xopenex,  dulera and Spiriva. Mucinex twice a day.Continue levofloxacin  Consult Dr. Meredeth Ide as patient sees Dr. Meredeth Ide, pulmonology as an outpatient  2. Altered mental status with dysarthria-perCT of the head negative I will check an ABG Patient still receiving her pain medications every 6 hours, I explained to the patient and the family that she may be retaining some of her pain medications in light of acute illness. And recommended that we decrease the dose however patient subsequently was screaming in pain. So I will go back to half of her normal dose.    3. Dehydration. Encourage oral fluid intake. Creatinine is back to normal  4.Chronic back pain. Pain control when necessary. Continue back brace  5,Anxiety. Xanax When necessary.  6.Tobacco abuse. Smoking cessationprovided by the admitting M.D.    7. Insomnia-AmbienAmbien tried with patient getting worst according to the family.  8. Deconditioning with physical therapy consult placed    All the records are reviewed and case discussed with Care Management/Social Workerr. Management plans discussed with the patient, family and they are in agreement.  CODE STATUS: fc  TOTAL TIME TAKING CARE OF THIS PATIENT: .      Auburn Bilberry M.D on 07/30/2015 at 12:53 PM  Between 7am to 6pm - Pager -  [phone] After 6pm go to [email or web address] - password [email or web address] [facility]  [place] [facility]  Office  [phone]  CC: Primary care physician; Lauro Regulus., MD

## 2015-07-30 NOTE — Progress Notes (Signed)
Notified JS RN [id number] DA NT

## 2015-07-31 LAB — GLUCOSE, CAPILLARY
GLUCOSE-CAPILLARY: 225 mg/dL — AB (ref 65–99)
GLUCOSE-CAPILLARY: 296 mg/dL — AB (ref 65–99)
Glucose-Capillary: 194 mg/dL — ABNORMAL HIGH (ref 65–99)
Glucose-Capillary: 164 mg/dL — ABNORMAL HIGH (ref 65–99)
Glucose-Capillary: 215 mg/dL — ABNORMAL HIGH (ref 65–99)

## 2015-07-31 LAB — BASIC METABOLIC PANEL
ANION GAP: 8 (ref 5–15)
BUN: 25 mg/dL — AB (ref 6–20)
CALCIUM: 8 mg/dL — AB (ref 8.9–10.3)
CO2: 28 mmol/L (ref 22–32)
Chloride: 98 mmol/L — ABNORMAL LOW (ref 101–111)
Creatinine, Ser: 0.59 mg/dL (ref 0.44–1.00)
GFR calc Af Amer: >60 mL/min (ref >60)
Glucose, Bld: 297 mg/dL — ABNORMAL HIGH (ref 65–99)
POTASSIUM: 4.3 mmol/L (ref 3.5–5.1)
SODIUM: 134 mmol/L — AB (ref 135–145)

## 2015-07-31 LAB — CBC
HEMATOCRIT: 40.5 % (ref 35.0–47.0)
HEMOGLOBIN: 13.6 g/dL (ref 12.0–16.0)
MCH: 29.7 pg (ref 26.0–34.0)
MCHC: 33.5 g/dL (ref 32.0–36.0)
MCV: 88.9 fL (ref 80.0–100.0)
PLATELETS: 458 10*3/uL — AB (ref 150–440)
RBC: 4.56 MIL/uL (ref 3.80–5.20)
RDW: 17.5 % — AB (ref 11.5–14.5)
WBC: 14.1 10*3/uL — ABNORMAL HIGH (ref 3.6–11.0)

## 2015-07-31 MED ORDER — ENOXAPARIN SODIUM 40 MG/0.4ML ~~LOC~~ SOLN
40.0000 mg | SUBCUTANEOUS | Status: DC
  Administered 2015-07-31: 40 mg via SUBCUTANEOUS
  Filled 2015-07-31: qty 0.4

## 2015-07-31 MED ORDER — PREDNISONE 50 MG PO TABS
50.0000 mg | ORAL_TABLET | Freq: Every day | ORAL | Status: DC
  Administered 2015-08-01: 50 mg via ORAL
  Filled 2015-07-31: qty 1

## 2015-07-31 MED ORDER — LEVOFLOXACIN IN D5W 500 MG/100ML IV SOLN
500.0000 mg | INTRAVENOUS | Status: DC
  Filled 2015-07-31: qty 100

## 2015-07-31 MED ORDER — LEVOFLOXACIN 500 MG PO TABS
500.0000 mg | ORAL_TABLET | Freq: Every day | ORAL | Status: DC
  Administered 2015-07-31 – 2015-08-01 (×2): 500 mg via ORAL
  Filled 2015-07-31 (×2): qty 1

## 2015-07-31 NOTE — Progress Notes (Signed)
Okay to leave IV out per dr. Clint Guy.

## 2015-07-31 NOTE — Progress Notes (Signed)
Physical Therapy Treatment Patient Details Name: LATISH TOUTANT MRN: [id number] DOB: 1946-07-06 Today's Date: 07/31/2015    History of Present Illness Patient is a pleasant 70 y/o female that presents with cough and productive sputum/shortness of breath. Patient on O2 at home at 2L as needed.     PT Comments    Patient demonstrates improved gait pattern and balance with SPC, she acknowldged she felt better and more steady with SPC in this session. Increased HR noted at rest (115) though no notable increase with ambulation (119-120). O2 sats maintained throughout ambulation, though she continues to fatigue with household distance ambulation. Patient would continue to benefit from skilled PT services to increase her tolerance for activity and conditioning.    Follow Up Recommendations  Home health PT     Equipment Recommendations  Cane    Recommendations for Other Services       Precautions / Restrictions Precautions Precautions: None Restrictions Weight Bearing Restrictions: No    Mobility  Bed Mobility Overal bed mobility: Independent             General bed mobility comments: No deficits in bed mobility.   Transfers Overall transfer level: Modified independent Equipment used: Straight cane             General transfer comment: No balance deficits noted, appropriate use of SPC.   Ambulation/Gait Ambulation/Gait assistance: Supervision Ambulation Distance (Feet): 200 Feet Assistive device: Straight cane Gait Pattern/deviations: Step-through pattern;Trunk flexed Gait velocity: Patient demonstrates improved balance, reciprocal gait pattern, and improved gait speed with SPC in this session.  Gait velocity interpretation: Below normal speed for age/gender General Gait Details: Patient demonstrates improved gait speed, though rest breaks secondary to dyspnea required. Improved balance (no sway laterally) with SPC noted.    Stairs            Wheelchair  Mobility    Modified Rankin (Stroke Patients Only)       Balance Overall balance assessment: Needs assistance Sitting-balance support: No upper extremity supported Sitting balance-Leahy Scale: Good     Standing balance support: Single extremity supported Standing balance-Leahy Scale: Fair Standing balance comment: Provided SPC, patient demonstrates improved gait speed and stride length with SPC than without AD.                     Cognition Arousal/Alertness: Awake/alert Behavior During Therapy: WFL for tasks assessed/performed Overall Cognitive Status: Within Functional Limits for tasks assessed                      Exercises Other Exercises Other Exercises: Assisted patient with bathroom transfer with cuing for sequencing with SPC and use of bathroom rail. No loss of balance noted.     General Comments        Pertinent Vitals/Pain Pain Assessment:  (Patient reports managable LBP symptoms, does not rate 0-10. Pt agrees to ambulate with SPC)    Home Living                      Prior Function            PT Goals (current goals can now be found in the care plan section) Acute Rehab PT Goals Patient Stated Goal: To return home  PT Goal Formulation: With patient Time For Goal Achievement: 08/11/15 Potential to Achieve Goals: Good Progress towards PT goals: Progressing toward goals    Frequency  Min 2X/week    PT Plan Current plan  remains appropriate    Co-evaluation             End of Session Equipment Utilized During Treatment: Gait belt Activity Tolerance: Patient tolerated treatment well;Patient limited by fatigue Patient left: in bed;with call bell/phone within reach;with bed alarm set;with family/visitor present     Time: 3832-9191 PT Time Calculation (min) (ACUTE ONLY): 15 min  Charges:  $Gait Training: 8-22 mins                    G Codes:      Kerin Ransom, PT, DPT    07/31/2015, 3:35 PM

## 2015-07-31 NOTE — Progress Notes (Signed)
Inpatient Diabetes Program Recommendations  AACE/ADA: New Consensus Statement on Inpatient Glycemic Control (2015)  Target Ranges:  Prepandial:   less than 140 mg/dL      Peak postprandial:   less than 180 mg/dL (1-2 hours)      Critically ill patients:  140 - 180 mg/dL  Results for Michelle Cabrera, Michelle Cabrera (MRN [id number]) as of 07/31/2015 11:18  Ref. Range 07/30/2015 07:30 07/30/2015 11:51 07/30/2015 16:29 07/30/2015 21:13 07/31/2015 09:52  Glucose-Capillary Latest Ref Range: 65-99 mg/dL 226 (H) 229 (H) 245 (H) 135 (H) 225 (H)   Review of Glycemic Control  Diabetes history: No Outpatient Diabetes medications: NA Current orders for Inpatient glycemic control: Novolog 0-9 units TID with meals, Novolog 0-5 units QHS  Inpatient Diabetes Program Recommendations: HgbA1C: A1C 8.0% on 07/29/15. According to ADA criteria if A1C 6.5% or greater, criteria met to dx with DM. Patient has no prior history of diabetes but note patient takes Prednisone 10 mg as needed for acute flare up. 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 MD, please indicate in note if patient will be diagnosed with diabetes so bedside nursing can educate patient on diabetes prior to discharge. Also, please indicate outpatient plan for glycemic control. Insulin-Basal- If steroids will be continued as ordered (Solumedrol 40 mg Q12H), please consider ordering low dose basal insulin. Recommend starting with Levemir 7 units Q24H (based on 69 kg x 0.1 units). Please note that if basal insulin is ordered as requested, once steroids are tapered down and discontinued the basal insulin will also likely need to be tapered.   Thanks, Barnie Alderman, RN, MSN, CDE Diabetes Coordinator Inpatient Diabetes Program [phone] (Team Pager from [place] to [place]) [phone] ([facility] office) [phone] [facility] office) [phone] [facility] office)

## 2015-07-31 NOTE — Care Management Important Message (Signed)
Important Message  Patient Details  Name: Michelle Cabrera MRN: [id number] Date of Birth: 01-Oct-1945   Medicare Important Message Given:  Yes    [name] 07/31/2015, 10:39 AM

## 2015-07-31 NOTE — Progress Notes (Signed)
[facility] - [place] at [facility]   PATIENT NAME: Michelle Cabrera    MR#:  [id number]  DATE OF BIRTH:  October 08, 1945  SUBJECTIVE:  CHIEF COMPLAINT: Patient is more awake husband at bedside states that this is normal her status REVIEW OF SYSTEMS:  CONSTITUTIONAL: No fever, fatigue or weakness.  EYES: No blurred or double vision.  EARS, NOSE, AND THROAT: No tinnitus or ear pain.  RESPIRATORY: Reports productive cough,  Improved shortness of breath,  Some  wheezing , denies hemoptysis.  CARDIOVASCULAR: No chest pain, orthopnea, edema.  GASTROINTESTINAL: No nausea, vomiting, diarrhea or abdominal pain.  GENITOURINARY: No dysuria, hematuria.  ENDOCRINE: No polyuria, nocturia,  HEMATOLOGY: No anemia, easy bruising or bleeding SKIN: No rash or lesion. MUSCULOSKELETAL: No joint pain or arthritis.   NEUROLOGIC: No tingling, numbness, weakness.  PSYCHIATRY: No anxiety or depression.   DRUG ALLERGIES:   Allergies  Allergen Reactions  . Codeine Other (See Comments)    Other Reaction: lip tounge swell  . Gold-Containing Drug Products Anaphylaxis  . Adhesive [Tape]     Tears skin  . Dilaudid [Hydromorphone Hcl]     Made tongue swell  . Pravastatin     Arms and legs hurt     VITALS:  Blood pressure 145/93, pulse 109, temperature 98.4 F (36.9 C), temperature source Oral, resp. rate 26, height 5\' 2"  (1.575 m), weight 69.4 kg (153 lb), SpO2 95 %.  PHYSICAL EXAMINATION:  GENERAL:  70 y.o.-year-old patient lying in the bed with no acute distress.  EYES: Pupils equal, round, reactive to light and accommodation. No scleral icterus. Extraocular muscles intact.  HEENT: Head atraumatic, normocephalic. Oropharynx and nasopharynx clear.  NECK:  Supple, no jugular venous distention. No thyroid enlargement, no tenderness.  LUNGS: Coarse bronchial breath sounds bilaterally, minimal end expiratory wheezing, rales,rhonchi or crepitation. No use of accessory muscles of  respiration.  CARDIOVASCULAR: S1, S2 normal. No murmurs, rubs, or gallops.  ABDOMEN: Soft, nontender, nondistended. Bowel sounds present. No organomegaly or mass.  EXTREMITIES: No pedal edema, cyanosis, or clubbing.  NEUROLOGIC: Cranial nerves II through XII are intact. Muscle strength at her baseline. Sensation intact. Gait not checked.  PSYCHIATRIC: The patient is alert and oriented x 3.  SKIN: No obvious rash, lesion, or ulcer.    LABORATORY PANEL:   CBC  Recent Labs Lab 07/31/15 0554  WBC 14.1*  HGB 13.6  HCT 40.5  PLT 458*   ------------------------------------------------------------------------------------------------------------------  Chemistries   Recent Labs Lab 07/26/15 1651  07/31/15 0554  NA  --   < > 134*  K  --   < > 4.3  CL  --   < > 98*  CO2  --   < > 28  GLUCOSE  --   < > 297*  BUN  --   < > 25*  CREATININE  --   < > 0.59  CALCIUM  --   < > 8.0*  MG 2.4  --   --   < > = values in this interval not displayed. ------------------------------------------------------------------------------------------------------------------  Cardiac Enzymes  Recent Labs Lab 07/26/15 0842  TROPONINI 0.03   ------------------------------------------------------------------------------------------------------------------  RADIOLOGY:  No results found.  EKG:   Orders placed or performed during the hospital encounter of 07/26/15  . EKG 12-Lead  . EKG 12-Lead    ASSESSMENT AND PLAN:   1. Acute on chronic hypoxic respiratory failure secondary to COPD exacerbation continue tapering IV Solu-Medrol,  Xopenex,  dulera and Spiriva. Mucinex twice a day.Continue levofloxacin  Continue oxygen therapy  2. Altered mental status with dysarthria-perCT of the head negative Suspect due to pain medications mental status according to the husband and patient back to baseline Initial hallucinations likely related to hypoxia   3. Dehydration. Encourage oral fluid intake.  Creatinine is back to normal  4.Chronic back pain. Pain control when necessary. Continue back brace  5,Anxiety. Xanax When necessary.  6.Tobacco abuse. Smoking cessationprovided by the admitting M.D.    7. Insomnia-AmbienAmbien tried with patient getting worst according to the family.  8. Deconditioning with physical therapy evaluation pending    All the records are reviewed and case discussed with Care Management/Social Workerr. Management plans discussed with the patient, family and they are in agreement.  CODE STATUS: fc  TOTAL TIME TAKING CARE OF THIS PATIENT: .      Auburn Bilberry M.D on 07/31/2015 at 2:22 PM  Between 7am to 6pm - Pager - [phone] After 6pm go to [email or web address] - password [email or web address] [facility]  [facility]  Office  [phone]  CC: Primary care physician; Lauro Regulus., MD

## 2015-08-01 LAB — BLOOD GAS, ARTERIAL
ACID-BASE EXCESS: 6.7 mmol/L — AB (ref 0.0–3.0)
ALLENS TEST (PASS/FAIL): POSITIVE — AB
BICARBONATE: 30.4 meq/L — AB (ref 21.0–28.0)
FIO2: 0.28
O2 Saturation: 95.5 %
PATIENT TEMPERATURE: 37
pCO2 arterial: 39 mmHg (ref 32.0–48.0)
pH, Arterial: 7.5 — ABNORMAL HIGH (ref 7.350–7.450)
pO2, Arterial: 71 mmHg — ABNORMAL LOW (ref 83.0–108.0)

## 2015-08-01 LAB — GLUCOSE, CAPILLARY
GLUCOSE-CAPILLARY: 157 mg/dL — AB (ref 65–99)
Glucose-Capillary: 293 mg/dL — ABNORMAL HIGH (ref 65–99)

## 2015-08-01 MED ORDER — LIVING WELL WITH DIABETES BOOK
Freq: Once | Status: AC
  Administered 2015-08-01: 09:00:00
  Filled 2015-08-01: qty 1

## 2015-08-01 MED ORDER — LEVOFLOXACIN 500 MG PO TABS: 500.0000 mg | ORAL_TABLET | Freq: Every day | ORAL | Status: DC

## 2015-08-01 NOTE — Progress Notes (Signed)
Dr. Allena Katz called and notified him that the diabetes educator recommendation of giving patient prescription for Metformin and the need for glucose testing supplies. Dr Allena Katz will write prescription for the glucose testing supplies but patient will receive a follow up for metformin if needed.

## 2015-08-01 NOTE — Care Management Note (Signed)
Case Management Note  Patient Details  Name: Michelle Cabrera MRN: [id number] Date of Birth: 1946/02/02  Subjective/Objective:                  Spoke with patient and husband at the bedside. Patient is from home with spouse and has nighttime O2 provided by [employer]. Physical therapy recommended Home Health PT. Patient qualified for 24 hour home O2. Contacted Ouida Sills at [place] to notify. [employer] will deliver portable tank to room prior to discharge. Orders written by attending physician. Offered choice of home health providers and patient expressed preference of provider in network with her insurance. Referla placed with Cathleen Corti at [facility]. Patietn has walker, bedside commode and shower chair at home. No other CM needs identified.   Action/Plan:   Expected Discharge Date:                  Expected Discharge Plan:  Home w Home Health Services  In-House Referral:     Discharge planning Services  CM Consult  Post Acute Care Choice:  Durable Medical Equipment Choice offered to:  Patient  DME Arranged:  Oxygen DME Agency:   [facility])  HH Arranged:  RN, PT HH Agency:  [facility]  Status of Service:  Completed, signed off  Medicare Important Message Given:  Yes Date Medicare IM Given:    Medicare IM give by:    Date Additional Medicare IM Given:    Additional Medicare Important Message give by:     If discussed at Long Length of Stay Meetings, dates discussed:    Additional Comments:  Adonis Huguenin, RN 08/01/2015, 3:08 PM

## 2015-08-01 NOTE — Progress Notes (Signed)
Spoke with patient about new diabetes diagnosis.  Discussed A1C results (8.0% on 07/29/15) and explained what an A1C is and informed patient that her current A1C indicates an average glucose of 180 mg/dl over the past 2-3 months. Discussed basic pathophysiology of DM Type 2, basic home care, importance of checking CBGs and maintaining good CBG control to prevent long-term and short-term complications. Reviewed glucose and A1C goals.  Reviewed signs and symptoms of hyperglycemia and hypoglycemia along with treatment for both. Discussed impact of nutrition, exercise, stress, sickness, and medications on diabetes control. 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  Reviewed Living Well with diabetes booklet and encouraged patient to read through entire book. Reviewed how to monitor glucose and asked patient to check glucose at least once a day first thing in the morning and to keep a log book of glucose readings.  Explained how the doctor she follows up with can use the log book to continue to make adjustments with DM medications if needed. Informed patient that RD would be seeing her to discuss Carb Modified diet. Also informed patient that MD has ordered outpatient diabetes education.  Patient verbalized understanding of information discussed and she states that she has no further questions at this time related to diabetes.   RNs to provide ongoing basic DM education at bedside with this patient and engage patient to actively check blood glucose.  At time of discharge, patient will need a prescription for glucometer and testing supplies. Recommend discharging patient on Metformin 500 mg QAM, have patient check fasting glucose daily, and have patient follow up with MD in 1-2 weeks.  Thanks, Orlando Penner, RN, MSN, CDE Diabetes Coordinator Inpatient Diabetes Program [phone] (Team Pager from 8am to 5pm) [phone] (AP office) [phone]  [facility] office) [phone] [facility] office)

## 2015-08-01 NOTE — Progress Notes (Signed)
Patient discharged to home as ordered. Patient is alert and oriented. Oxygen tank for home at the bedside and oxygen on patient at 2 liters. Patient discharge instructions given as ordered. Prescriptions given to patient. Patient husband at the bedside to take patient home.

## 2015-08-01 NOTE — Progress Notes (Signed)
Initial Nutrition Assessment     INTERVENTION:  Meals and snacks: Cater to pt preferences Nutrition diet education:   RD consulted for nutrition education regarding diabetes.   Lab Results  Component Value Date   HGBA1C 8.0* 07/29/2015    RD provided "Carbohydrate Counting for People with Diabetes" handout from the Academy of Nutrition and Dietetics. Discussed different food groups and their effects on blood sugar, emphasizing carbohydrate-containing foods. Provided list of carbohydrates and recommended serving sizes of common foods.  Discussed importance of controlled and consistent carbohydrate intake throughout the day. Provided examples of ways to balance meals/snacks and encouraged intake of high-fiber, whole grain complex carbohydrates. Teach back method used.  Expect good compliance.   NUTRITION DIAGNOSIS:   Food and nutrition related knowledge deficit related to chronic illness as evidenced by  (consulted for diet education).    GOAL:   Patient will meet greater than or equal to 90% of their needs    MONITOR:    (Energy intake, Glucose profile)  REASON FOR ASSESSMENT:   Consult Diet education  ASSESSMENT:      Pt admitted with COPD exacerbation on steriods, new diagnosis of DM  Past Medical History  Diagnosis Date  . Hypertension   . Closed compression fracture of L1 lumbar vertebra (HCC) 11/2012  . COPD (chronic obstructive pulmonary disease) (HCC)   . Anxiety   . Depression   . GERD (gastroesophageal reflux disease)   . Rheumatoid arthritis (HCC)   . DDD (degenerative disc disease)   . Chronic lower back pain   . Collagen vascular disease (HCC)     Current Nutrition: tolerating intake  Food/Nutrition-Related History: pt reports normal intake prior to admission   Scheduled Medications:  . aspirin EC  81 mg Oral Daily  . DULoxetine  30 mg Oral QHS  . DULoxetine  60 mg Oral q morning - 10a  . enoxaparin (LOVENOX) injection  40 mg  Subcutaneous Q24H  . guaiFENesin  600 mg Oral BID  . insulin aspart  0-5 Units Subcutaneous QHS  . insulin aspart  0-9 Units Subcutaneous TID WC  . levalbuterol  1.25 mg Nebulization QID  . levofloxacin  500 mg Oral Daily  . mometasone-formoterol  2 puff Inhalation BID  . nicotine  14 mg Transdermal Daily  . nystatin  5 mL Mouth/Throat QID  . pantoprazole  40 mg Oral QAC breakfast  . predniSONE  50 mg Oral Q breakfast  . senna-docusate  1 tablet Oral BID  . sodium chloride  3 mL Intravenous Q12H  . tiotropium  18 mcg Inhalation Daily    Electrolyte/Renal Profile and Glucose Profile:   Recent Labs Lab 07/26/15 1651 07/27/15 0428 07/28/15 0433 07/31/15 0554  NA  --  136 138 134*  K  --  4.2 4.0 4.3  CL  --  101 101 98*  CO2  --  28 29 28   BUN  --  23* 26* 25*  CREATININE  --  0.66 0.61 0.59  CALCIUM  --  8.5* 8.5* 8.0*  MG 2.4  --   --   --   GLUCOSE  --  343* 384* 297*    Gastrointestinal Profile: Last BM:12/31     Weight Change: stable wt    Diet Order:  Diet Heart Room service appropriate?: Yes; Fluid consistency:: Thin  Skin:   reviewed   Height:   Ht Readings from Last 1 Encounters:  07/26/15 5\' 2"  (1.575 m)    Weight:   Wt Readings  from Last 1 Encounters:  07/26/15 153 lb (69.4 kg)    Ideal Body Weight:     BMI:  Body mass index is 27.98 kg/(m^2).   EDUCATION NEEDS:   Education needs addressed  LOW Care Level  Kline Bulthuis B. Freida Busman, RD, LDN [phone] (pager) Weekend/On-Call pager [phone])

## 2015-08-01 NOTE — Discharge Instructions (Signed)
°  DIET:  Diabetic diet, low sodium diet  DISCHARGE CONDITION:  Stable  ACTIVITY:  Activity as tolerated  OXYGEN:  Home Oxygen: Yes.     Oxygen Delivery: 2 liters/min via Patient connected to nasal cannula oxygen  DISCHARGE LOCATION:  home    ADDITIONAL DISCHARGE INSTRUCTION:   If you experience worsening of your admission symptoms, develop shortness of breath, life threatening emergency, suicidal or homicidal thoughts you must seek medical attention immediately by calling 911 or calling your MD immediately  if symptoms less severe.  You Must read complete instructions/literature along with all the possible adverse reactions/side effects for all the Medicines you take and that have been prescribed to you. Take any new Medicines after you have completely understood and accpet all the possible adverse reactions/side effects.   Please note  You were cared for by a hospitalist during your hospital stay. If you have any questions about your discharge medications or the care you received while you were in the hospital after you are discharged, you can call the unit and asked to speak with the hospitalist on call if the hospitalist that took care of you is not available. Once you are discharged, your primary care physician will handle any further medical issues. Please note that NO REFILLS for any discharge medications will be authorized once you are discharged, as it is imperative that you return to your primary care physician (or establish a relationship with a primary care physician if you do not have one) for your aftercare needs so that they can reassess your need for medications and monitor your lab values.

## 2015-08-01 NOTE — Progress Notes (Signed)
SATURATION QUALIFICATIONS: (This note is used to comply with regulatory documentation for home oxygen)  Patient Saturations on Room Air at Rest = 92%  Patient Saturations on Room Air while Ambulating =88%  Patient Saturations on 2 Liters of oxygen while Ambulating 92%  Please briefly explain why patient needs home oxygen:Patient is a COPD patient and gets short of breath while ambulating

## 2015-08-01 NOTE — Discharge Summary (Signed)
Michelle Cabrera, 70 y.o., DOB May 24, 1946, MRN [id number]. Admission date: 07/26/2015 Discharge Date 08/01/2015 Primary MD Lauro Regulus., MD Admitting Physician Shaune Pollack, MD  Admission Diagnosis  COPD exacerbation [facility]) [J44.1] Chronic back pain [M54.9, G89.29]  Discharge Diagnosis   Principal Problem:  Acute on chronic COPD exacerbation (HCC)   Acute encephalopathy likely related to hypoxia   Dehydration Chronic back pain Anxiety Back or abuse Insomnia      Hospital Course Michelle Cabrera is a 70 y.o. female with a known history of COPD, hypertension and anxiety. She is on home oxygen as needed. Patient was brought to the emergency room with complaint of progressive shortness of breath cough and congestion. Prior to admission patient was also having some visual hallucinations. She is on intermittent oxygen but not continuous oxygen. She was seen in the emergency room and admitted for acute on chronic COPD exasperation treated with nebulizers steroids and antibiotics with significant improvement in her respiratory symptoms. Patient breathing is improved. She also apparently was having some visual loosen nations therefore underwent workup including a CT scan of the head which was negative. I suspect this was likely related to her respiratory illness.Now pt mental status back to normal. She will be continued on continuous oxygen therapy.          Consults  None  Significant Tests:  See full reports for all details    Dg Chest 2 View  07/26/2015  CLINICAL DATA:  Shortness of breath with productive cough and chest pain EXAM: CHEST  2 VIEW COMPARISON:  Chest radiograph February 26, 2015; chest CT February 26, 2015 FINDINGS: There is stable fine reticular interstitial disease diffusely. There is no airspace consolidation. Heart size and pulmonary vascularity normal. No adenopathy. Bones are osteoporotic. 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 There is postoperative change in mid lumbar region. There are foci of carotid artery calcification bilaterally. IMPRESSION: Stable fine reticular interstitial prominence, likely of chronic inflammatory type etiology. Specific etiology uncertain. No frank edema or consolidation. Cardiac silhouette within normal limits. Bones osteoporotic. Areas of carotid artery calcification bilaterally. Electronically Signed   By: Bretta Bang III M.D.   On: 07/26/2015 09:02   Ct Head Wo Contrast  07/29/2015  CLINICAL DATA:  70 year old female with altered mental status and confusion. EXAM: CT HEAD WITHOUT CONTRAST TECHNIQUE: Contiguous axial images were obtained from the base of the skull through the vertex without intravenous contrast. COMPARISON:  07/24/2009 CT FINDINGS: Atrophy and chronic small-vessel white matter ischemic changes are again noted. No acute intracranial abnormalities are identified, including mass lesion or mass effect, hydrocephalus, extra-axial fluid collection, midline shift, hemorrhage, or acute infarction. Small amount of fluid within the sphenoid and ethmoid sinuses noted. The visualized bony calvarium is unremarkable. IMPRESSION: No evidence of acute intracranial abnormality. Atrophy and chronic small-vessel white matter ischemic changes. Small amount of fluid within ethmoid and sphenoid sinuses and may represent acute sinusitis. Electronically Signed   By: Harmon Pier M.D.   On: 07/29/2015 14:50       Today   Subjective:   Michelle Cabrera feels better shortness of breath improved  Objective:   Blood pressure 124/82, pulse 108, temperature 97.8 F (36.6 C), temperature source Oral, resp. rate 18, height 5\' 2"  (1.575 m), weight 69.4 kg (153 lb), SpO2 95 %.  .  Intake/Output Summary (Last 24 hours) at 08/01/15 1537 Last data filed at 08/01/15 0500  Gross per 24 hour  Intake  3 ml  Output      0 ml  Net      3 ml    Exam VITAL SIGNS: Blood pressure  124/82, pulse 108, temperature 97.8 F (36.6 C), temperature source Oral, resp. rate 18, height 5\' 2"  (1.575 m), weight 69.4 kg (153 lb), SpO2 95 %.  GENERAL:  70 y.o.-year-old patient lying in the bed with no acute distress.  EYES: Pupils equal, round, reactive to light and accommodation. No scleral icterus. Extraocular muscles intact.  HEENT: Head atraumatic, normocephalic. Oropharynx and nasopharynx clear.  NECK:  Supple, no jugular venous distention. No thyroid enlargement, no tenderness.  LUNGS: Normal breath sounds bilaterally, no wheezing, rales,rhonchi or crepitation. No use of accessory muscles of respiration.  CARDIOVASCULAR: S1, S2 normal. No murmurs, rubs, or gallops.  ABDOMEN: Soft, nontender, nondistended. Bowel sounds present. No organomegaly or mass.  EXTREMITIES: No pedal edema, cyanosis, or clubbing.  NEUROLOGIC: Cranial nerves II through XII are intact. Muscle strength 5/5 in all extremities. Sensation intact. Gait not checked.  PSYCHIATRIC: The patient is alert and oriented x 3.  SKIN: No obvious rash, lesion, or ulcer.   Data Review     CBC w Diff: Lab Results  Component Value Date   WBC 14.1* 07/31/2015   HGB 13.6 07/31/2015   HCT 40.5 07/31/2015   PLT 458* 07/31/2015   LYMPHOPCT 4 07/26/2015   MONOPCT 3 07/26/2015   EOSPCT 0 07/26/2015   BASOPCT 0 07/26/2015   CMP: Lab Results  Component Value Date   NA 134* 07/31/2015   K 4.3 07/31/2015   CL 98* 07/31/2015   CO2 28 07/31/2015   BUN 25* 07/31/2015   CREATININE 0.59 07/31/2015   PROT 7.6 07/15/2015   ALBUMIN 3.9 07/15/2015   BILITOT 0.8 07/15/2015   ALKPHOS 137* 07/15/2015   AST 25 07/15/2015   ALT 22 07/15/2015  .  Micro Results Recent Results (from the past 240 hour(s))  MRSA PCR Screening     Status: None   Collection Time: 07/26/15  6:49 PM  Result Value Ref Range Status   MRSA by PCR NEGATIVE NEGATIVE Final    Comment:        The GeneXpert MRSA Assay (FDA approved for NASAL  specimens only), is one component of a comprehensive MRSA colonization surveillance program. It is not intended to diagnose MRSA infection nor to guide or monitor treatment for MRSA infections.   Urine culture     Status: None (Preliminary result)   Collection Time: 07/30/15  4:20 AM  Result Value Ref Range Status   Specimen Description URINE, RANDOM  Final   Special Requests NONE  Final   Culture   Final    >=100,000 COLONIES/mL YEAST IDENTIFICATION TO FOLLOW    Report Status PENDING  Incomplete        Code Status Orders        Start     Ordered   07/26/15 1552  Full code   Continuous     07/26/15 1551          Follow-up Information    Follow up with 07/28/15., MD. Go on 08/07/2015.   Specialty:  Internal Medicine   Why:  @9am    Contact information:   [address] [facility] [place] [phone]       Discharge Medications     Medication List    STOP taking these medications        cephALEXin 500 MG  capsule  Commonly known as:  KEFLEX     sulfamethoxazole-trimethoprim 800-160 MG tablet  Commonly known as:  BACTRIM DS      TAKE these medications        aspirin EC 81 MG EC tablet  Generic drug:  aspirin  Take 81 mg by mouth daily. Swallow whole.     Ben Gay Arthritis Formula 8-30 % Crea  Apply 1 application topically 2 (two) times daily as needed (for back pain).     BREO ELLIPTA 100-25 MCG/INH Aepb  Generic drug:  Fluticasone Furoate-Vilanterol  Inhale 1 puff into the lungs every morning.     Calcium-Vitamin D-Minerals 600-400 MG-UNIT Chew  Chew 2 tablets by mouth 2 (two) times daily.     DULoxetine 60 MG capsule  Commonly known as:  CYMBALTA  Take 60 mg by mouth every morning.     DULoxetine 30 MG capsule  Commonly known as:  CYMBALTA  Take 30 mg by mouth at bedtime.     etanercept 50 MG/ML injection  Commonly known as:  ENBREL  Inject 50 mg into the skin once a week. Friday      furosemide 40 MG tablet  Commonly known as:  LASIX  Take 40 mg by mouth daily as needed for fluid or edema.     gabapentin 300 MG capsule  Commonly known as:  NEURONTIN  Take 600 mg by mouth 3 (three) times daily as needed (leg pain).     ibuprofen 200 MG tablet  Commonly known as:  ADVIL,MOTRIN  Take 400 mg by mouth every 6 (six) hours as needed for mild pain.     ipratropium-albuterol 0.5-2.5 (3) MG/3ML Soln  Commonly known as:  DUONEB  Take 3 mLs by nebulization every 6 (six) hours as needed (SOB).     levofloxacin 500 MG tablet  Commonly known as:  LEVAQUIN  Take 1 tablet (500 mg total) by mouth daily.     Methotrexate (PF) 25 MG/0.4ML Soaj  Inject 50 mg into the skin once a week. Friday     omeprazole 20 MG capsule  Commonly known as:  PRILOSEC  Take 20 mg by mouth 2 (two) times daily.     oxyCODONE 5 MG immediate release tablet  Commonly known as:  Oxy IR/ROXICODONE  Take 1-2 tablets (5-10 mg total) by mouth every 4 (four) hours as needed for moderate pain.     oxyCODONE-acetaminophen 10-325 MG tablet  Commonly known as:  PERCOCET  Take 1 tablet by mouth every 6 (six) hours as needed for pain.     predniSONE 5 MG tablet  Commonly known as:  DELTASONE  Take 10 mg by mouth as needed (flare up).     RECLAST 5 MG/100ML Soln injection  Generic drug:  zoledronic acid  Inject 5 mg into the vein See admin instructions. *uses once a year*     torsemide 10 MG tablet  Commonly known as:  DEMADEX  Take 10 mg by mouth daily as needed (fluid).     traZODone 50 MG tablet  Commonly known as:  DESYREL  Take 50-100 mg by mouth at bedtime as needed for sleep.     vitamin C 500 MG tablet  Commonly known as:  ASCORBIC ACID  Take 500 mg by mouth daily.           Total Time in preparing paper work, data evaluation and todays exam - 35 minutes  Auburn Bilberry M.D on 08/01/2015 at 3:37 PM  [facility]  Physicians   Office  [phone]

## 2015-08-02 LAB — URINE CULTURE: Culture: >=100000

## 2015-08-15 NOTE — Care Management (Addendum)
Post discharge on 08/01/15: Michelle Cabrera RNCM received call from patient's daughter stating that home health never came out and as far as I know our CM department was not notified that [facility] was out of network to this patient. I have contacted [facility] to see if they can provide services to this patient- referral called by Lyn RNCM yesterday but [facility]a Shutters said they "never received faxed referral".   I have asked [facility] to call me back with agreement to provide home health or not. I have contacted patient's daughter Michelle Cabrera [phone] (wort?/[facility] office) to let her know plan and that it may be that patient's insurance just will not cover Home Health services. She will speak with her mother/patient.   08/15/15 at 12:50PM: Received callback from patient's daughter Michelle Cabrera stating that she "talked to the patient and the patient is now declining home health services; she does not feel that she need CNA or PT". I have notified [facility]. Case closed.

## 2015-08-21 ENCOUNTER — Emergency Department: Payer: Medicare Other

## 2015-08-21 ENCOUNTER — Encounter: Payer: Self-pay | Admitting: Emergency Medicine

## 2015-08-21 ENCOUNTER — Emergency Department
Admission: EM | Admit: 2015-08-21 | Discharge: 2015-08-21 | Disposition: A | Discharge status: Home / Self Care | Payer: Medicare Other | Attending: Emergency Medicine | Admitting: Emergency Medicine

## 2015-08-21 DIAGNOSIS — Z79899 Other long term (current) drug therapy: Secondary | ICD-10-CM | POA: Diagnosis not present

## 2015-08-21 DIAGNOSIS — Z792 Long term (current) use of antibiotics: Secondary | ICD-10-CM | POA: Insufficient documentation

## 2015-08-21 DIAGNOSIS — Y998 Other external cause status: Secondary | ICD-10-CM | POA: Insufficient documentation

## 2015-08-21 DIAGNOSIS — W19XXXA Unspecified fall, initial encounter: Secondary | ICD-10-CM

## 2015-08-21 DIAGNOSIS — F17210 Nicotine dependence, cigarettes, uncomplicated: Secondary | ICD-10-CM | POA: Insufficient documentation

## 2015-08-21 DIAGNOSIS — S42211A Unspecified displaced fracture of surgical neck of right humerus, initial encounter for closed fracture: Secondary | ICD-10-CM

## 2015-08-21 DIAGNOSIS — W010XXA Fall on same level from slipping, tripping and stumbling without subsequent striking against object, initial encounter: Secondary | ICD-10-CM | POA: Diagnosis not present

## 2015-08-21 DIAGNOSIS — Z7951 Long term (current) use of inhaled steroids: Secondary | ICD-10-CM | POA: Diagnosis not present

## 2015-08-21 DIAGNOSIS — S61411A Laceration without foreign body of right hand, initial encounter: Secondary | ICD-10-CM | POA: Diagnosis not present

## 2015-08-21 DIAGNOSIS — S81011A Laceration without foreign body, right knee, initial encounter: Secondary | ICD-10-CM | POA: Diagnosis not present

## 2015-08-21 DIAGNOSIS — S81012A Laceration without foreign body, left knee, initial encounter: Secondary | ICD-10-CM | POA: Diagnosis not present

## 2015-08-21 DIAGNOSIS — Z7982 Long term (current) use of aspirin: Secondary | ICD-10-CM | POA: Diagnosis not present

## 2015-08-21 DIAGNOSIS — S61412A Laceration without foreign body of left hand, initial encounter: Secondary | ICD-10-CM | POA: Diagnosis not present

## 2015-08-21 DIAGNOSIS — Y9301 Activity, walking, marching and hiking: Secondary | ICD-10-CM | POA: Diagnosis not present

## 2015-08-21 DIAGNOSIS — Y9289 Other specified places as the place of occurrence of the external cause: Secondary | ICD-10-CM | POA: Diagnosis not present

## 2015-08-21 DIAGNOSIS — I10 Essential (primary) hypertension: Secondary | ICD-10-CM | POA: Insufficient documentation

## 2015-08-21 DIAGNOSIS — S4991XA Unspecified injury of right shoulder and upper arm, initial encounter: Secondary | ICD-10-CM | POA: Diagnosis present

## 2015-08-21 DIAGNOSIS — T148XXA Other injury of unspecified body region, initial encounter: Secondary | ICD-10-CM

## 2015-08-21 MED ORDER — OXYCODONE-ACETAMINOPHEN 5-325 MG PO TABS
1.0000 | ORAL_TABLET | Freq: Once | ORAL | Status: AC
  Administered 2015-08-21: 1 via ORAL
  Filled 2015-08-21: qty 1

## 2015-08-21 MED ORDER — CEPHALEXIN 500 MG PO CAPS: 500.0000 mg | ORAL_CAPSULE | Freq: Four times a day (QID) | ORAL | Status: DC

## 2015-08-21 NOTE — Discharge Instructions (Signed)
Shoulder Fracture You have a fractured humerus (bone in the upper arm) at the shoulder just below the ball of the shoulder joint. Most of the time the bones of a broken shoulder are in an acceptable position. Usually the injury can be treated with a shoulder immobilizer or sling and swath bandage. These devices support the arm and prevent any shoulder movement. If the bones are not in a good position, then surgery is sometimes needed. Shoulder fractures usually cause swelling, pain, and discoloration around the upper arm initially. They heal in 8-12 weeks with proper treatment. Rest in bed or a reclining chair as long as your shoulder is very painful. Sitting up generally results in less pain at the fracture site. Do not remove your shoulder bandage until your caregiver approves. You may apply ice packs over the shoulder for 20-30 minutes every 2 hours for the next 2-3 days to reduce the pain and swelling. Use your pain medicine as prescribed.  SEEK IMMEDIATE MEDICAL CARE IF:  You develop severe shoulder pain unrelieved by rest and taking pain medicine.  You have pain, numbness, tingling, or weakness in the hand or wrist.  You develop shortness of breath, chest pain, severe weakness, or fainting.  You have severe pain with motion of the fingers or wrist. MAKE SURE YOU:   Understand these instructions.  Will watch your condition.  Will get help right away if you are not doing well or get worse.   This information is not intended to replace advice given to you by your health care provider. Make sure you discuss any questions you have with your health care provider.    Laceration Care, Adult    A laceration is a cut that goes through all of the layers of the skin and into the tissue that is right under the skin. Some lacerations heal on their own. Others need to be closed with stitches (sutures), staples, skin adhesive strips, or skin glue. Proper laceration care minimizes the risk of  infection and helps the laceration to heal better.  HOW TO CARE FOR YOUR LACERATION  If sutures or staples were used:  Keep the wound clean and dry.  If you were given a bandage (dressing), you should change it at least one time per day or as told by your health care provider. You should also change it if it becomes wet or dirty.  Keep the wound completely dry for the first 24 hours or as told by your health care provider. After that time, you may shower or bathe. However, make sure that the wound is not soaked in water until after the sutures or staples have been removed.  Clean the wound one time each day or as told by your health care provider:  Wash the wound with soap and water.  Rinse the wound with water to remove all soap.  Pat the wound dry with a clean towel. Do not rub the wound. After cleaning the wound, apply a thin layer of antibiotic ointment as told by your health care provider. This will help to prevent infection and keep the dressing from sticking to the wound.  Have the sutures or staples removed as told by your health care provider. If skin adhesive strips were used:  Keep the wound clean and dry.  If you were given a bandage (dressing), you should change it at least one time per day or as told by your health care provider. You should also change it if it becomes dirty or  wet.  Do not get the skin adhesive strips wet. You may shower or bathe, but be careful to keep the wound dry.  If the wound gets wet, pat it dry with a clean towel. Do not rub the wound.  Skin adhesive strips fall off on their own. You may trim the strips as the wound heals. Do not remove skin adhesive strips that are still stuck to the wound. They will fall off in time. If skin glue was used:  Try to keep the wound dry, but you may briefly wet it in the shower or bath. Do not soak the wound in water, such as by swimming.  After you have showered or bathed, gently pat the wound dry with a clean towel. Do not  rub the wound.  Do not do any activities that will make you sweat heavily until the skin glue has fallen off on its own.  Do not apply liquid, cream, or ointment medicine to the wound while the skin glue is in place. Using those may loosen the film before the wound has healed.  If you were given a bandage (dressing), you should change it at least one time per day or as told by your health care provider. You should also change it if it becomes dirty or wet.  If a dressing is placed over the wound, be careful not to apply tape directly over the skin glue. Doing that may cause the glue to be pulled off before the wound has healed.  Do not pick at the glue. The skin glue usually remains in place for 5-10 days, then it falls off of the skin. General Instructions  Take over-the-counter and prescription medicines only as told by your health care provider.  If you were prescribed an antibiotic medicine or ointment, take or apply it as told by your doctor. Do not stop using it even if your condition improves.  To help prevent scarring, make sure to cover your wound with sunscreen whenever you are outside after stitches are removed, after adhesive strips are removed, or when glue remains in place and the wound is healed. Make sure to wear a sunscreen of at least 30 SPF.  Do not scratch or pick at the wound.  Keep all follow-up visits as told by your health care provider. This is important.  Check your wound every day for signs of infection. Watch for:  Redness, swelling, or pain.  Fluid, blood, or pus. Raise (elevate) the injured area above the level of your heart while you are sitting or lying down, if possible. SEEK MEDICAL CARE IF:  You received a tetanus shot and you have swelling, severe pain, redness, or bleeding at the injection site.  You have a fever.  A wound that was closed breaks open.  You notice a bad smell coming from your wound or your dressing.  You notice something coming out of the  wound, such as wood or glass.  Your pain is not controlled with medicine.  You have increased redness, swelling, or pain at the site of your wound.  You have fluid, blood, or pus coming from your wound.  You notice a change in the color of your skin near your wound.  You need to change the dressing frequently due to fluid, blood, or pus draining from the wound.  You develop a new rash.  You develop numbness around the wound. SEEK IMMEDIATE MEDICAL CARE IF:  You develop severe swelling around the wound.  Your pain suddenly  increases and is severe.  You develop painful lumps near the wound or on skin that is anywhere on your body.  You have a red streak going away from your wound.  The wound is on your hand or foot and you cannot properly move a finger or toe.  The wound is on your hand or foot and you notice that your fingers or toes look pale or bluish. This information is not intended to replace advice given to you by your health care provider. Make sure you discuss any questions you have with your health care provider.  Document Released: 07/15/2005 Document Revised: 11/29/2014 Document Reviewed: 07/11/2014  Elsevier Interactive Patient Education 2016 [employer]   Document Released: 08/22/2004 Document Revised: 10/07/2011 Document Reviewed: 11/02/2008 Elsevier Interactive Patient Education [employer]

## 2015-08-21 NOTE — ED Provider Notes (Signed)
[facility] Emergency Department Provider Note  ____________________________________________  Time seen: Approximately 9:02 PM  I have reviewed the triage vital signs and the nursing notes.   HISTORY  Chief Complaint Fall    HPI Michelle Cabrera is a 70 y.o. female who presents to the emergency department complaining of right shoulder pain, and multiple skin tears/abrasions. She states that she was walking to her mailbox at home when she tripped and fell. She endorses severe pain to the right shoulder with limited range of motion. She also endorses multiple abrasions to bilateral knees and left hand. She states that she was able to stop bleeding prior to arrival. Her last tetanus shot was less than 4 years ago. She did not hit her head or lose consciousness at any point.   Past Medical History  Diagnosis Date  . Hypertension   . Closed compression fracture of L1 lumbar vertebra (HCC) 11/2012  . COPD (chronic obstructive pulmonary disease) (HCC)   . Anxiety   . Depression   . GERD (gastroesophageal reflux disease)   . Rheumatoid arthritis (HCC)   . DDD (degenerative disc disease)   . Chronic lower back pain   . Collagen vascular disease [facility])     Patient Active Problem List   Diagnosis Date Noted  . COPD exacerbation (HCC) 07/26/2015  . Arthritis associated with another disorder 09/15/2014  . Wedge compression fracture of T6 vertebra (HCC) 02/07/2014  . Intractable low back pain 12/27/2012  . DDD (degenerative disc disease) 12/27/2012  . Rheumatoid arthritis (HCC) 12/27/2012  . Hypertension 12/27/2012    Past Surgical History  Procedure Laterality Date  . Lumbar disc surgery       X 3  . Kyphoplasty N/A 01/04/2013    Procedure: L1 Kyphoplasty;  Surgeon: Hewitt Shorts, MD;  Location: [facility] NEURO ORS;  Service: Neurosurgery;  Laterality: N/A;  L1 Kyphoplasty  . Tubal ligation    . Foot surgery    . Wrist fusion Right     30 yrs ago  .  Kyphoplasty N/A 02/07/2014    Procedure: THORACIC SIX KYPHOPLASTY;  Surgeon: Hewitt Shorts, MD;  Location: [facility] NEURO ORS;  Service: Neurosurgery;  Laterality: N/A;  T6 Kyphoplasty   . Hand tendon surgery Right 09/15/2014    "d/t RA"  . Tonsillectomy    . Posterior lumbar fusion      "got screws in"  . Back surgery    . Cataract extraction w/ intraocular lens  implant, bilateral Bilateral   . Finger arthroplasty Right 09/15/2014    Procedure: RIGHT MIDDLE FINGER, RING FINGER, SMALL FINGER EXTENSOR DIGITORUM COMMUMIS STABILIZATION WITH RIGHT MIDDLE FINGER MCP ARTHROPLASTY, POSSIBLE RING FINGER AND SMALL FINGER MCP ARTHROPLASTY;  Surgeon: Dominica Severin, MD;  Location: [facility] OR;  Service: Orthopedics;  Laterality: Right;  . Breast biopsy Left     negative    Current Outpatient Rx  Name  Route  Sig  Dispense  Refill  . aspirin (ASPIRIN EC) 81 MG EC tablet   Oral   Take 81 mg by mouth daily. Swallow whole.         . Calcium Carbonate-Vit D-Min (CALCIUM-VITAMIN D-MINERALS) 600-400 MG-UNIT CHEW   Oral   Chew 2 tablets by mouth 2 (two) times daily.          . cephALEXin (KEFLEX) 500 MG capsule   Oral   Take 1 capsule (500 mg total) by mouth 4 (four) times daily.   28 capsule   0   .  DULoxetine (CYMBALTA) 30 MG capsule   Oral   Take 30 mg by mouth at bedtime.          . DULoxetine (CYMBALTA) 60 MG capsule   Oral   Take 60 mg by mouth every morning.         . etanercept (ENBREL) 50 MG/ML injection   Subcutaneous   Inject 50 mg into the skin once a week. Friday         . Fluticasone Furoate-Vilanterol (BREO ELLIPTA) 100-25 MCG/INH AEPB   Inhalation   Inhale 1 puff into the lungs every morning.         . furosemide (LASIX) 40 MG tablet   Oral   Take 40 mg by mouth daily as needed for fluid or edema.          . gabapentin (NEURONTIN) 300 MG capsule   Oral   Take 600 mg by mouth 3 (three) times daily as needed (leg pain).          Marland Kitchen ibuprofen (ADVIL,MOTRIN) 200  MG tablet   Oral   Take 400 mg by mouth every 6 (six) hours as needed for mild pain.         Marland Kitchen ipratropium-albuterol (DUONEB) 0.5-2.5 (3) MG/3ML SOLN   Nebulization   Take 3 mLs by nebulization every 6 (six) hours as needed (SOB).          Marland Kitchen levofloxacin (LEVAQUIN) 500 MG tablet   Oral   Take 1 tablet (500 mg total) by mouth daily.   3 tablet   0   . Menthol-Methyl Salicylate (BEN GAY ARTHRITIS FORMULA) 8-30 % CREA   Apply externally   Apply 1 application topically 2 (two) times daily as needed (for back pain).          . Methotrexate, PF, 25 MG/0.4ML SOAJ   Subcutaneous   Inject 50 mg into the skin once a week. Friday         . omeprazole (PRILOSEC) 20 MG capsule   Oral   Take 20 mg by mouth 2 (two) times daily.          Marland Kitchen oxyCODONE (OXY IR/ROXICODONE) 5 MG immediate release tablet   Oral   Take 1-2 tablets (5-10 mg total) by mouth every 4 (four) hours as needed for moderate pain. Patient not taking: Reported on 02/26/2015   50 tablet   0   . oxyCODONE-acetaminophen (PERCOCET) 10-325 MG tablet   Oral   Take 1 tablet by mouth every 6 (six) hours as needed for pain.         . predniSONE (DELTASONE) 5 MG tablet   Oral   Take 10 mg by mouth as needed (flare up).          . torsemide (DEMADEX) 10 MG tablet   Oral   Take 10 mg by mouth daily as needed (fluid).          . traZODone (DESYREL) 50 MG tablet   Oral   Take 50-100 mg by mouth at bedtime as needed for sleep.          . vitamin C (ASCORBIC ACID) 500 MG tablet   Oral   Take 500 mg by mouth daily.         . zoledronic acid (RECLAST) 5 MG/100ML SOLN injection   Intravenous   Inject 5 mg into the vein See admin instructions. *uses once a year*           Allergies Codeine; Gold-containing  drug products; Adhesive; Dilaudid; and Pravastatin  Family History  Problem Relation Age of Onset  . CAD Father   . CAD Mother   . Hypertension Mother   . Peripheral vascular disease Mother   .  Breast cancer Maternal Aunt 97    Social History Social History  Substance Use Topics  . Smoking status: Current Every Day Smoker -- 1.00 packs/day for 53 years    Types: Cigarettes  . Smokeless tobacco: Never Used  . Alcohol Use: No     Review of Systems  Constitutional: No fever/chills Eyes: No visual changes. No discharge ENT: No sore throat. Cardiovascular: no chest pain. Respiratory: no cough. No SOB. Gastrointestinal: No abdominal pain.  No nausea, no vomiting.  No diarrhea.  No constipation. Genitourinary: Negative for dysuria. No hematuria Musculoskeletal: Negative for back pain. Was at her right shoulder pain. Skin: Negative for rash. Positive for abrasions to bilateral knees, left hand. Neurological: Negative for headaches, focal weakness or numbness. 10-point ROS otherwise negative.  ____________________________________________   PHYSICAL EXAM:  VITAL SIGNS: ED Triage Vitals  Enc Vitals Group     BP 08/21/15 1814 126/90 mmHg     Pulse Rate 08/21/15 1814 112     Resp 08/21/15 1814 20     Temp 08/21/15 1814 97.7 F (36.5 C)     Temp Source 08/21/15 1814 Oral     SpO2 08/21/15 1814 99 %     Weight 08/21/15 1814 144 lb (65.318 kg)     Height 08/21/15 1814 5\' 2"  (1.575 m)     Head Cir --      Peak Flow --      Pain Score 08/21/15 1813 7     Pain Loc --      Pain Edu? --      Excl. in GC? --      Constitutional: Alert and oriented. Well appearing and in no acute distress. Eyes: Conjunctivae are normal. PERRL. EOMI. Head: Atraumatic. ENT:      Ears:       Nose: No congestion/rhinnorhea.      Mouth/Throat: Mucous membranes are moist.  Neck: No stridor.  No cervical spine tenderness to palpation. Hematological/Lymphatic/Immunilogical: No cervical lymphadenopathy. Cardiovascular: Normal rate, regular rhythm. Normal S1 and S2.  Good peripheral circulation. Respiratory: Normal respiratory effort without tachypnea or retractions. Lungs  CTAB. Gastrointestinal: Soft and nontender. No distention. No CVA tenderness. Musculoskeletal: No lower extremity tenderness nor edema.  No joint effusions. No range of motion to right shoulder. Extreme tenderness to palpation over the proximal humerus. No palpable abnormality. Radial pulse is appreciated distal to injury. Sensation is intact and equal to the unaffected extremity. Neurologic:  Normal speech and language. No gross focal neurologic deficits are appreciated. Renal nerves II through XII are grossly intact. Skin:  Skin is warm, dry and intact. No rash noted. Significant skin tears are noted to bilateral knees. Skin tear on right is worse than left. Skin tear of right measures approximately 8 cm x 10 cm. Bleeding is controlled. No visible foreign body. Skin tear on left knee is approximately 3 cm x 6 cm. Skin tear to right hand is approximately 3 cm x 5 cm. Psychiatric: Mood and affect are normal. Speech and behavior are normal. Patient exhibits appropriate insight and judgement.   ____________________________________________   LABS (all labs ordered are listed, but only abnormal results are displayed)  Labs Reviewed - No data to display ____________________________________________  EKG   ____________________________________________  RADIOLOGY Festus Barren  Jawuan Robb, personally viewed and evaluated these images (plain radiographs) as part of my medical decision making, as well as reviewing the written report by the radiologist.  Dg Shoulder Right  08/21/2015  CLINICAL DATA:  Patient states she slipped while walking back from mail box. Patient is now having pain to right shoulder. Patient unable to perform axillary view due to injury. EXAM: RIGHT SHOULDER - 2+ VIEW COMPARISON:  None. FINDINGS: There is a comminuted fracture of the right humeral neck. The humeral head appears located over the glenoid fossa. Lung apex is clear. Prior thoracic vertebroplasty. IMPRESSION: Right  humeral neck fracture. Electronically Signed   By: Norva Pavlov M.D.   On: 08/21/2015 20:47    ____________________________________________    PROCEDURES  Procedure(s) performed:   LACERATION REPAIR Performed by: Racheal Patches Authorized by: Delorise Royals Victora Irby Consent: Verbal consent obtained. Risks and benefits: risks, benefits and alternatives were discussed Consent given by: patient Patient identity confirmed: provided demographic data  Wound explored  Laceration Location: Left dorsal hand  Laceration Length: 5 cm  No Foreign Bodies seen or palpated  Irrigation method: syringe Amount of cleaning: standard  Skin closure: Steri-Strips   Technique: Wound edges are well approximated and closed together using Steri-Strips. Area is then covered using petroleum dressing, gauze, an Ace bandage   Patient tolerance: Patient tolerated the procedure well with no immediate complications.     The skin tears to bilateral knees are cleansed, debrided of foreign material as much as possible, GERD and Surgicel and petroleum dressing and dressed with an Ace bandage. The patient is alerted to watch for any signs of infection (redness, pus, pain, increased swelling or fever) and call if such occurs. Home wound care instructions are provided. Tetanus vaccination status reviewed: tetanus re-vaccination not indicated.     Medications  oxyCODONE-acetaminophen (PERCOCET/ROXICET) 5-325 MG per tablet 1 tablet (1 tablet Oral Given 08/21/15 2141)     ____________________________________________   INITIAL IMPRESSION / ASSESSMENT AND PLAN / ED COURSE  Pertinent labs & imaging results that were available during my care of the patient were reviewed by me and considered in my medical decision making (see chart for details).  Patient's diagnosis is consistent with fractured humeral neck right sided, and multiple skin tears/abrasions. After x-ray is reviewed Dr. Martha Clan is paged.  The patient's history, symptoms, physical exam, and radiological findings are discussed with him and he advises to discharge the patient home with sling and to have her follow-up with him in the office for further evaluation and surgical management. Patient will be discharged home with prescriptions for antibiotics to cover wound to right hand.. Patient is currently on narcotic pain medication for chronic back pain. She is to continue taking this medication for these injuries. She will follow-up with Dr. Martha Clan for treatment of humeral neck fracture. Patient is given ED precautions to return to the ED for any worsening or new symptoms.     ____________________________________________  FINAL CLINICAL IMPRESSION(S) / ED DIAGNOSES  Final diagnoses:  Fall, initial encounter  Humeral surgical neck fracture, right, closed, initial encounter  Multiple skin tears      NEW MEDICATIONS STARTED DURING THIS VISIT:  Discharge Medication List as of 08/21/2015 11:21 PM    START taking these medications   Details  cephALEXin (KEFLEX) 500 MG capsule Take 1 capsule (500 mg total) by mouth 4 (four) times daily., Starting 08/21/2015, Until Discontinued, Print            Racheal Patches, PA-C 08/22/15  [id number]  Jeanmarie Plant, MD 08/24/15 [phone]

## 2015-08-21 NOTE — ED Notes (Signed)
States she slipped while walking back from mail box  Having pain to right shoulder,arm and leg

## 2015-09-05 ENCOUNTER — Encounter: Payer: Self-pay | Admitting: Emergency Medicine

## 2015-09-05 ENCOUNTER — Inpatient Hospital Stay
Admission: EM | Admit: 2015-09-05 | Discharge: 2015-09-08 | DRG: 689 | Disposition: A | Discharge status: Skilled Nursing Facility | Payer: Medicare Other | Attending: Specialist | Admitting: Specialist

## 2015-09-05 DIAGNOSIS — Z794 Long term (current) use of insulin: Secondary | ICD-10-CM

## 2015-09-05 DIAGNOSIS — F419 Anxiety disorder, unspecified: Secondary | ICD-10-CM | POA: Diagnosis present

## 2015-09-05 DIAGNOSIS — R410 Disorientation, unspecified: Secondary | ICD-10-CM

## 2015-09-05 DIAGNOSIS — G9341 Metabolic encephalopathy: Secondary | ICD-10-CM | POA: Diagnosis present

## 2015-09-05 DIAGNOSIS — E119 Type 2 diabetes mellitus without complications: Secondary | ICD-10-CM

## 2015-09-05 DIAGNOSIS — Z87891 Personal history of nicotine dependence: Secondary | ICD-10-CM

## 2015-09-05 DIAGNOSIS — S80211A Abrasion, right knee, initial encounter: Secondary | ICD-10-CM | POA: Diagnosis present

## 2015-09-05 DIAGNOSIS — R441 Visual hallucinations: Secondary | ICD-10-CM | POA: Diagnosis present

## 2015-09-05 DIAGNOSIS — Z79891 Long term (current) use of opiate analgesic: Secondary | ICD-10-CM

## 2015-09-05 DIAGNOSIS — F329 Major depressive disorder, single episode, unspecified: Secondary | ICD-10-CM | POA: Diagnosis present

## 2015-09-05 DIAGNOSIS — Z7982 Long term (current) use of aspirin: Secondary | ICD-10-CM

## 2015-09-05 DIAGNOSIS — R627 Adult failure to thrive: Secondary | ICD-10-CM | POA: Diagnosis present

## 2015-09-05 DIAGNOSIS — L8990 Pressure ulcer of unspecified site, unspecified stage: Secondary | ICD-10-CM | POA: Insufficient documentation

## 2015-09-05 DIAGNOSIS — Z9981 Dependence on supplemental oxygen: Secondary | ICD-10-CM

## 2015-09-05 DIAGNOSIS — S42201D Unspecified fracture of upper end of right humerus, subsequent encounter for fracture with routine healing: Secondary | ICD-10-CM

## 2015-09-05 DIAGNOSIS — S52502A Unspecified fracture of the lower end of left radius, initial encounter for closed fracture: Secondary | ICD-10-CM | POA: Diagnosis present

## 2015-09-05 DIAGNOSIS — E1165 Type 2 diabetes mellitus with hyperglycemia: Secondary | ICD-10-CM | POA: Diagnosis present

## 2015-09-05 DIAGNOSIS — R4182 Altered mental status, unspecified: Secondary | ICD-10-CM | POA: Diagnosis not present

## 2015-09-05 DIAGNOSIS — E876 Hypokalemia: Secondary | ICD-10-CM

## 2015-09-05 DIAGNOSIS — W19XXXD Unspecified fall, subsequent encounter: Secondary | ICD-10-CM | POA: Diagnosis present

## 2015-09-05 DIAGNOSIS — G629 Polyneuropathy, unspecified: Secondary | ICD-10-CM | POA: Diagnosis present

## 2015-09-05 DIAGNOSIS — Z885 Allergy status to narcotic agent status: Secondary | ICD-10-CM

## 2015-09-05 DIAGNOSIS — E860 Dehydration: Secondary | ICD-10-CM | POA: Diagnosis present

## 2015-09-05 DIAGNOSIS — Z79899 Other long term (current) drug therapy: Secondary | ICD-10-CM

## 2015-09-05 DIAGNOSIS — Z7951 Long term (current) use of inhaled steroids: Secondary | ICD-10-CM

## 2015-09-05 DIAGNOSIS — N390 Urinary tract infection, site not specified: Principal | ICD-10-CM | POA: Diagnosis present

## 2015-09-05 DIAGNOSIS — M1990 Unspecified osteoarthritis, unspecified site: Secondary | ICD-10-CM | POA: Diagnosis present

## 2015-09-05 DIAGNOSIS — S42209A Unspecified fracture of upper end of unspecified humerus, initial encounter for closed fracture: Secondary | ICD-10-CM

## 2015-09-05 DIAGNOSIS — M359 Systemic involvement of connective tissue, unspecified: Secondary | ICD-10-CM | POA: Diagnosis present

## 2015-09-05 DIAGNOSIS — K219 Gastro-esophageal reflux disease without esophagitis: Secondary | ICD-10-CM | POA: Diagnosis present

## 2015-09-05 DIAGNOSIS — Z7984 Long term (current) use of oral hypoglycemic drugs: Secondary | ICD-10-CM

## 2015-09-05 DIAGNOSIS — N3001 Acute cystitis with hematuria: Secondary | ICD-10-CM | POA: Diagnosis present

## 2015-09-05 DIAGNOSIS — M545 Low back pain: Secondary | ICD-10-CM | POA: Diagnosis present

## 2015-09-05 DIAGNOSIS — L89159 Pressure ulcer of sacral region, unspecified stage: Secondary | ICD-10-CM | POA: Diagnosis present

## 2015-09-05 DIAGNOSIS — I10 Essential (primary) hypertension: Secondary | ICD-10-CM | POA: Diagnosis present

## 2015-09-05 DIAGNOSIS — M069 Rheumatoid arthritis, unspecified: Secondary | ICD-10-CM | POA: Diagnosis present

## 2015-09-05 DIAGNOSIS — J449 Chronic obstructive pulmonary disease, unspecified: Secondary | ICD-10-CM | POA: Diagnosis present

## 2015-09-05 DIAGNOSIS — G8929 Other chronic pain: Secondary | ICD-10-CM | POA: Diagnosis present

## 2015-09-05 DIAGNOSIS — S80212A Abrasion, left knee, initial encounter: Secondary | ICD-10-CM | POA: Diagnosis present

## 2015-09-05 DIAGNOSIS — Z888 Allergy status to other drugs, medicaments and biological substances status: Secondary | ICD-10-CM

## 2015-09-05 LAB — URINALYSIS COMPLETE WITH MICROSCOPIC (ARMC ONLY)
BACTERIA UA: NONE SEEN
Bilirubin Urine: NEGATIVE
Glucose, UA: >500 mg/dL — AB
KETONES UR: NEGATIVE mg/dL
NITRITE: NEGATIVE
PH: 7 (ref 5.0–8.0)
PROTEIN: NEGATIVE mg/dL
SPECIFIC GRAVITY, URINE: 1.031 — AB (ref 1.005–1.030)

## 2015-09-05 LAB — COMPREHENSIVE METABOLIC PANEL
ALT: 20 U/L (ref 14–54)
AST: 24 U/L (ref 15–41)
Albumin: 2 g/dL — ABNORMAL LOW (ref 3.5–5.0)
Alkaline Phosphatase: 401 U/L — ABNORMAL HIGH (ref 38–126)
Anion gap: 9 (ref 5–15)
BUN: 10 mg/dL (ref 6–20)
CHLORIDE: 96 mmol/L — AB (ref 101–111)
CO2: 31 mmol/L (ref 22–32)
Calcium: 7.6 mg/dL — ABNORMAL LOW (ref 8.9–10.3)
Creatinine, Ser: 0.49 mg/dL (ref 0.44–1.00)
Glucose, Bld: 382 mg/dL — ABNORMAL HIGH (ref 65–99)
POTASSIUM: 2.4 mmol/L — AB (ref 3.5–5.1)
Sodium: 136 mmol/L (ref 135–145)
Total Bilirubin: 0.7 mg/dL (ref 0.3–1.2)
Total Protein: 5.8 g/dL — ABNORMAL LOW (ref 6.5–8.1)

## 2015-09-05 LAB — CBC
HEMATOCRIT: 34.9 % — AB (ref 35.0–47.0)
Hemoglobin: 11.6 g/dL — ABNORMAL LOW (ref 12.0–16.0)
MCH: 29.9 pg (ref 26.0–34.0)
MCHC: 33.4 g/dL (ref 32.0–36.0)
MCV: 89.7 fL (ref 80.0–100.0)
Platelets: 382 10*3/uL (ref 150–440)
RBC: 3.89 MIL/uL (ref 3.80–5.20)
RDW: 19.6 % — ABNORMAL HIGH (ref 11.5–14.5)
WBC: 9.1 10*3/uL (ref 3.6–11.0)

## 2015-09-05 LAB — GLUCOSE, CAPILLARY: Glucose-Capillary: 369 mg/dL — ABNORMAL HIGH (ref 65–99)

## 2015-09-05 MED ORDER — POTASSIUM CHLORIDE 20 MEQ PO PACK
40.0000 meq | PACK | Freq: Two times a day (BID) | ORAL | Status: DC
  Administered 2015-09-05 – 2015-09-06 (×3): 40 meq via ORAL
  Filled 2015-09-05 (×5): qty 2

## 2015-09-05 MED ORDER — SODIUM CHLORIDE 0.9 % IV BOLUS (SEPSIS)
1000.0000 mL | Freq: Once | INTRAVENOUS | Status: AC
  Administered 2015-09-05: 1000 mL via INTRAVENOUS

## 2015-09-05 NOTE — ED Provider Notes (Signed)
[facility] Emergency Department Provider Note  ____________________________________________  Time seen: 11:00 PM  I have reviewed the triage vital signs and the nursing notes.   HISTORY  Chief Complaint Altered Mental Status and Weakness     HPI Michelle Cabrera is a 70 y.o. female presents with altered mental status generalized weakness per family. Of note patient had a fall approximately 2 weeks ago resulting in a right shoulder fracture. Patient and daughter attest and fact that she's had decreased by mouth intake however increased urination and increased thirst. Glucose on EMS presentation noted to be greater than 590. 400 ML's of normal saline was given in route by EMS glucose on arrival 398. Patient alert and confused stating that it is 2020    Past Medical History  Diagnosis Date  . Hypertension   . Closed compression fracture of L1 lumbar vertebra (HCC) 11/2012  . COPD (chronic obstructive pulmonary disease) (HCC)   . Anxiety   . Depression   . GERD (gastroesophageal reflux disease)   . Rheumatoid arthritis (HCC)   . DDD (degenerative disc disease)   . Chronic lower back pain   . Collagen vascular disease [facility])     Patient Active Problem List   Diagnosis Date Noted  . COPD exacerbation (HCC) 07/26/2015  . Arthritis associated with another disorder 09/15/2014  . Wedge compression fracture of T6 vertebra (HCC) 02/07/2014  . Intractable low back pain 12/27/2012  . DDD (degenerative disc disease) 12/27/2012  . Rheumatoid arthritis (HCC) 12/27/2012  . Hypertension 12/27/2012    Past Surgical History  Procedure Laterality Date  . Lumbar disc surgery       X 3  . Kyphoplasty N/A 01/04/2013    Procedure: L1 Kyphoplasty;  Surgeon: Hewitt Shorts, MD;  Location: [facility]UR[facility] OR[facility];  Service: Neurosurgery;  Laterality: N/A;  L1 Kyphoplasty  . Tubal ligation    . Foot surgery    . Wrist fusion Right     30 yrs ago  . Kyphoplasty N/A 02/07/2014     Procedure: THORACIC SIX KYPHOPLASTY;  Surgeon: Hewitt Shorts, MD;  Location: [facility]UR[facility] OR[facility];  Service: Neurosurgery;  Laterality: N/A;  T6 Kyphoplasty   . Hand tendon surgery Right 09/15/2014    "d/t RA"  . Tonsillectomy    . Posterior lumbar fusion      "got screws in"  . Back surgery    . Cataract extraction w/ intraocular lens  implant, bilateral Bilateral   . Finger arthroplasty Right 09/15/2014    Procedure: RIGHT MIDDLE FINGER, RING FINGER, SMALL FINGER EXTENSOR DIGITORUM COMMUMIS STABILIZATION WITH RIGHT MIDDLE FINGER MCP ARTHROPLASTY, POSSIBLE RING FINGER AND SMALL FINGER MCP ARTHROPLASTY;  Surgeon: Dominica Severin, MD;  Location: [facility] OR;  Service: Orthopedics;  Laterality: Right;  . Breast biopsy Left     negative    Current Outpatient Rx  Name  Route  Sig  Dispense  Refill  . aspirin (ASPIRIN EC) 81 MG EC tablet   Oral   Take 81 mg by mouth daily. Swallow whole.         . Calcium Carbonate-Vit D-Min (CALCIUM-VITAMIN D-MINERALS) 600-400 MG-UNIT CHEW   Oral   Chew 2 tablets by mouth 2 (two) times daily.          . DULoxetine (CYMBALTA) 30 MG capsule   Oral   Take 30 mg by mouth at bedtime.          . DULoxetine (CYMBALTA) 60 MG capsule   Oral  Take 60 mg by mouth every morning.         . etanercept (ENBREL) 50 MG/ML injection   Subcutaneous   Inject 50 mg into the skin once a week. Friday         . Fluticasone Furoate-Vilanterol (BREO ELLIPTA) 100-25 MCG/INH AEPB   Inhalation   Inhale 1 puff into the lungs every morning.         . furosemide (LASIX) 40 MG tablet   Oral   Take 40 mg by mouth daily as needed for fluid or edema.          . gabapentin (NEURONTIN) 300 MG capsule   Oral   Take 600 mg by mouth 3 (three) times daily as needed (leg pain).          Marland Kitchen ibuprofen (ADVIL,MOTRIN) 200 MG tablet   Oral   Take 400 mg by mouth every 6 (six) hours as needed for mild pain.         Marland Kitchen ipratropium-albuterol (DUONEB) 0.5-2.5 (3) MG/3ML SOLN    Nebulization   Take 3 mLs by nebulization every 6 (six) hours as needed (SOB).          . Menthol-Methyl Salicylate (BEN GAY ARTHRITIS FORMULA) 8-30 % CREA   Apply externally   Apply 1 application topically 2 (two) times daily as needed (for back pain).          . Methotrexate, PF, 25 MG/0.4ML SOAJ   Subcutaneous   Inject 50 mg into the skin once a week. Friday         . omeprazole (PRILOSEC) 20 MG capsule   Oral   Take 20 mg by mouth 2 (two) times daily.          Marland Kitchen oxyCODONE-acetaminophen (PERCOCET) 10-325 MG tablet   Oral   Take 1 tablet by mouth every 6 (six) hours as needed for pain.         . predniSONE (DELTASONE) 5 MG tablet   Oral   Take 10 mg by mouth as needed (flare up).          . torsemide (DEMADEX) 10 MG tablet   Oral   Take 10 mg by mouth daily as needed (fluid).          . traZODone (DESYREL) 50 MG tablet   Oral   Take 50-100 mg by mouth at bedtime as needed for sleep.          . vitamin C (ASCORBIC ACID) 500 MG tablet   Oral   Take 500 mg by mouth daily.         . zoledronic acid (RECLAST) 5 MG/100ML SOLN injection   Intravenous   Inject 5 mg into the vein See admin instructions. *uses once a year*           Allergies Codeine; Gold-containing drug products; Adhesive; Dilaudid; and Pravastatin  Family History  Problem Relation Age of Onset  . CAD Father   . CAD Mother   . Hypertension Mother   . Peripheral vascular disease Mother   . Breast cancer Maternal Aunt 63    Social History Social History  Substance Use Topics  . Smoking status: Current Every Day Smoker -- 1.00 packs/day for 53 years    Types: Cigarettes  . Smokeless tobacco: Never Used  . Alcohol Use: No    Review of Systems  Constitutional: Negative for fever. Eyes: Negative for visual changes. ENT: Negative for sore throat. Cardiovascular: Negative for chest pain. Respiratory:  Negative for shortness of breath. Gastrointestinal: Negative for abdominal  pain, vomiting and diarrhea. Genitourinary: Negative for dysuria. Musculoskeletal: Negative for back pain. Skin: Negative for rash. Neurological: Negative for headaches, focal weakness or numbness.   10-point ROS otherwise negative.  ____________________________________________   PHYSICAL EXAM:  VITAL SIGNS: ED Triage Vitals  Enc Vitals Group     BP 09/05/15 2115 116/89 mmHg     Pulse Rate 09/05/15 2115 112     Resp 09/05/15 2130 17     Temp 09/05/15 2119 97.9 F (36.6 C)     Temp Source 09/05/15 2119 Oral     SpO2 09/05/15 2114 98 %     Weight 09/05/15 2120 140 lb (63.504 kg)     Height 09/05/15 2120 5\' 2"  (1.575 m)     Head Cir --      Peak Flow --      Pain Score 09/05/15 2120 7     Pain Loc --      Pain Edu? --      Excl. in GC? --      Constitutional: Alert and confused Eyes: Conjunctivae are normal. PERRL. Normal extraocular movements. ENT   Head: Normocephalic and atraumatic.   Nose: No congestion/rhinnorhea.   Mouth/Throat: Dry oral mucosa   Neck: No stridor. Hematological/Lymphatic/Immunilogical: No cervical lymphadenopathy. Cardiovascular: Normal rate, regular rhythm. Normal and symmetric distal pulses are present in all extremities. No murmurs, rubs, or gallops. Respiratory: Normal respiratory effort without tachypnea nor retractions. Breath sounds are clear and equal bilaterally. No wheezes/rales/rhonchi. Gastrointestinal: Soft and nontender. No distention. There is no CVA tenderness. Genitourinary: deferred Musculoskeletal: Nontender with normal range of motion in all extremities. No joint effusions.  No lower extremity tenderness nor edema. Neurologic:  Normal speech and language. No gross focal neurologic deficits are appreciated. Speech is normal.  Skin:  Skin is warm, dry and intact. No rash noted. Psychiatric: Mood and affect are normal. Speech and behavior are normal. Patient exhibits appropriate insight and  judgment.  ____________________________________________    LABS (pertinent positives/negatives)  Labs Reviewed  COMPREHENSIVE METABOLIC PANEL - Abnormal; Notable for the following:    Potassium 2.4 (*)    Chloride 96 (*)    Glucose, Bld 382 (*)    Calcium 7.6 (*)    Total Protein 5.8 (*)    Albumin 2.0 (*)    Alkaline Phosphatase 401 (*)    All other components within normal limits  CBC - Abnormal; Notable for the following:    Hemoglobin 11.6 (*)    HCT 34.9 (*)    RDW 19.6 (*)    All other components within normal limits  GLUCOSE, CAPILLARY - Abnormal; Notable for the following:    Glucose-Capillary 369 (*)    All other components within normal limits  URINALYSIS COMPLETEWITH MICROSCOPIC ([facility] ONLY)  CBG MONITORING, ED       INITIAL IMPRESSION / ASSESSMENT AND PLAN / ED COURSE  Pertinent labs & imaging results that were available during my care of the patient were reviewed by me and considered in my medical decision making (see chart for details).  IV normal saline 2 L given. Potassium chloride 40 mEq by mouth given. History of physical exam consistent with new onset type 2 diabetes review the patient's chart shows an elevated glucose exceeding 200 since December.  ____________________________________________   FINAL CLINICAL IMPRESSION(S) / ED DIAGNOSES  Final diagnoses:  New onset type 2 diabetes mellitus (HCC)  Hypokalemia      Darci Current,  MD 09/05/15 2355

## 2015-09-05 NOTE — ED Notes (Signed)
Pt arrived from home by EMS with c/o AMS, weakness and elevated CBG reading (pt non-diabetic.) EMS reports pt fell 2 weeks ago, leaving pt with right shoulder fracture and per pts family (daughter) pt has since had decreased intake, increased output, weakness and AMS. Per daughter pt has not been able to get out of bed and has developed breakdown on buttocks. Per EMS pt had CBG reading of 590 at home, on arrival EMS got 498, post of NS EMS got CBG 398. EMS reports ST depression in leads 4,5,6. Pt is on 2L O2 at home and took 1 Oxycodone 325/10 @ 2000.

## 2015-09-06 ENCOUNTER — Inpatient Hospital Stay: Payer: Medicare Other

## 2015-09-06 ENCOUNTER — Encounter: Payer: Self-pay | Admitting: Internal Medicine

## 2015-09-06 DIAGNOSIS — R4182 Altered mental status, unspecified: Secondary | ICD-10-CM | POA: Diagnosis present

## 2015-09-06 DIAGNOSIS — J449 Chronic obstructive pulmonary disease, unspecified: Secondary | ICD-10-CM | POA: Diagnosis present

## 2015-09-06 DIAGNOSIS — Z79899 Other long term (current) drug therapy: Secondary | ICD-10-CM | POA: Diagnosis not present

## 2015-09-06 DIAGNOSIS — S80211A Abrasion, right knee, initial encounter: Secondary | ICD-10-CM | POA: Diagnosis present

## 2015-09-06 DIAGNOSIS — G9341 Metabolic encephalopathy: Secondary | ICD-10-CM | POA: Diagnosis present

## 2015-09-06 DIAGNOSIS — G8929 Other chronic pain: Secondary | ICD-10-CM | POA: Diagnosis present

## 2015-09-06 DIAGNOSIS — F329 Major depressive disorder, single episode, unspecified: Secondary | ICD-10-CM | POA: Diagnosis present

## 2015-09-06 DIAGNOSIS — Z888 Allergy status to other drugs, medicaments and biological substances status: Secondary | ICD-10-CM | POA: Diagnosis not present

## 2015-09-06 DIAGNOSIS — I10 Essential (primary) hypertension: Secondary | ICD-10-CM | POA: Diagnosis present

## 2015-09-06 DIAGNOSIS — R627 Adult failure to thrive: Secondary | ICD-10-CM | POA: Diagnosis present

## 2015-09-06 DIAGNOSIS — E876 Hypokalemia: Secondary | ICD-10-CM | POA: Diagnosis present

## 2015-09-06 DIAGNOSIS — L89159 Pressure ulcer of sacral region, unspecified stage: Secondary | ICD-10-CM | POA: Diagnosis present

## 2015-09-06 DIAGNOSIS — S52502A Unspecified fracture of the lower end of left radius, initial encounter for closed fracture: Secondary | ICD-10-CM | POA: Diagnosis present

## 2015-09-06 DIAGNOSIS — N3001 Acute cystitis with hematuria: Secondary | ICD-10-CM | POA: Diagnosis present

## 2015-09-06 DIAGNOSIS — W19XXXD Unspecified fall, subsequent encounter: Secondary | ICD-10-CM | POA: Diagnosis present

## 2015-09-06 DIAGNOSIS — E119 Type 2 diabetes mellitus without complications: Secondary | ICD-10-CM

## 2015-09-06 DIAGNOSIS — Z7951 Long term (current) use of inhaled steroids: Secondary | ICD-10-CM | POA: Diagnosis not present

## 2015-09-06 DIAGNOSIS — E860 Dehydration: Secondary | ICD-10-CM | POA: Diagnosis present

## 2015-09-06 DIAGNOSIS — F419 Anxiety disorder, unspecified: Secondary | ICD-10-CM | POA: Diagnosis present

## 2015-09-06 DIAGNOSIS — Z7982 Long term (current) use of aspirin: Secondary | ICD-10-CM | POA: Diagnosis not present

## 2015-09-06 DIAGNOSIS — Z885 Allergy status to narcotic agent status: Secondary | ICD-10-CM | POA: Diagnosis not present

## 2015-09-06 DIAGNOSIS — E1165 Type 2 diabetes mellitus with hyperglycemia: Secondary | ICD-10-CM | POA: Diagnosis present

## 2015-09-06 DIAGNOSIS — M1990 Unspecified osteoarthritis, unspecified site: Secondary | ICD-10-CM | POA: Diagnosis present

## 2015-09-06 DIAGNOSIS — Z87891 Personal history of nicotine dependence: Secondary | ICD-10-CM | POA: Diagnosis not present

## 2015-09-06 DIAGNOSIS — S42201D Unspecified fracture of upper end of right humerus, subsequent encounter for fracture with routine healing: Secondary | ICD-10-CM | POA: Diagnosis not present

## 2015-09-06 DIAGNOSIS — Z7984 Long term (current) use of oral hypoglycemic drugs: Secondary | ICD-10-CM | POA: Diagnosis not present

## 2015-09-06 DIAGNOSIS — N390 Urinary tract infection, site not specified: Secondary | ICD-10-CM | POA: Diagnosis present

## 2015-09-06 DIAGNOSIS — Z794 Long term (current) use of insulin: Secondary | ICD-10-CM | POA: Diagnosis not present

## 2015-09-06 DIAGNOSIS — Z9981 Dependence on supplemental oxygen: Secondary | ICD-10-CM | POA: Diagnosis not present

## 2015-09-06 DIAGNOSIS — M069 Rheumatoid arthritis, unspecified: Secondary | ICD-10-CM | POA: Diagnosis present

## 2015-09-06 DIAGNOSIS — R441 Visual hallucinations: Secondary | ICD-10-CM | POA: Diagnosis present

## 2015-09-06 DIAGNOSIS — M545 Low back pain: Secondary | ICD-10-CM | POA: Diagnosis present

## 2015-09-06 DIAGNOSIS — K219 Gastro-esophageal reflux disease without esophagitis: Secondary | ICD-10-CM | POA: Diagnosis present

## 2015-09-06 DIAGNOSIS — Z79891 Long term (current) use of opiate analgesic: Secondary | ICD-10-CM | POA: Diagnosis not present

## 2015-09-06 DIAGNOSIS — S80212A Abrasion, left knee, initial encounter: Secondary | ICD-10-CM | POA: Diagnosis present

## 2015-09-06 DIAGNOSIS — M359 Systemic involvement of connective tissue, unspecified: Secondary | ICD-10-CM | POA: Diagnosis present

## 2015-09-06 DIAGNOSIS — G629 Polyneuropathy, unspecified: Secondary | ICD-10-CM | POA: Diagnosis present

## 2015-09-06 LAB — BLOOD GAS, ARTERIAL
ALLENS TEST (PASS/FAIL): POSITIVE — AB
Acid-Base Excess: 8 mmol/L — ABNORMAL HIGH (ref 0.0–3.0)
Bicarbonate: 32.8 mEq/L — ABNORMAL HIGH (ref 21.0–28.0)
FIO2: 0.3
O2 SAT: 96.6 %
PATIENT TEMPERATURE: 37
pCO2 arterial: 45 mmHg (ref 32.0–48.0)
pH, Arterial: 7.47 — ABNORMAL HIGH (ref 7.350–7.450)
pO2, Arterial: 81 mmHg — ABNORMAL LOW (ref 83.0–108.0)

## 2015-09-06 LAB — BASIC METABOLIC PANEL
ANION GAP: 6 (ref 5–15)
BUN: 9 mg/dL (ref 6–20)
CALCIUM: 6.7 mg/dL — AB (ref 8.9–10.3)
CO2: 26 mmol/L (ref 22–32)
CREATININE: 0.37 mg/dL — AB (ref 0.44–1.00)
Chloride: 105 mmol/L (ref 101–111)
Glucose, Bld: 244 mg/dL — ABNORMAL HIGH (ref 65–99)
Potassium: 3.2 mmol/L — ABNORMAL LOW (ref 3.5–5.1)
SODIUM: 137 mmol/L (ref 135–145)

## 2015-09-06 LAB — GLUCOSE, CAPILLARY
GLUCOSE-CAPILLARY: 244 mg/dL — AB (ref 65–99)
GLUCOSE-CAPILLARY: 216 mg/dL — AB (ref 65–99)
GLUCOSE-CAPILLARY: 115 mg/dL — AB (ref 65–99)
Glucose-Capillary: 224 mg/dL — ABNORMAL HIGH (ref 65–99)
Glucose-Capillary: 226 mg/dL — ABNORMAL HIGH (ref 65–99)
Glucose-Capillary: 163 mg/dL — ABNORMAL HIGH (ref 65–99)

## 2015-09-06 LAB — CBC
HCT: 30.1 % — ABNORMAL LOW (ref 35.0–47.0)
HEMOGLOBIN: 10 g/dL — AB (ref 12.0–16.0)
MCH: 29.9 pg (ref 26.0–34.0)
MCHC: 33.1 g/dL (ref 32.0–36.0)
MCV: 90.1 fL (ref 80.0–100.0)
PLATELETS: 350 10*3/uL (ref 150–440)
RBC: 3.34 MIL/uL — AB (ref 3.80–5.20)
RDW: 19.8 % — AB (ref 11.5–14.5)
WBC: 8.8 10*3/uL (ref 3.6–11.0)

## 2015-09-06 LAB — CREATININE, SERUM
CREATININE: 0.4 mg/dL — AB (ref 0.44–1.00)
GFR calc Af Amer: >60 mL/min (ref >60)

## 2015-09-06 LAB — HEMOGLOBIN A1C: HEMOGLOBIN A1C: 11.5 % — AB (ref 4.0–6.0)

## 2015-09-06 MED ORDER — OXYCODONE-ACETAMINOPHEN 5-325 MG PO TABS
1.0000 | ORAL_TABLET | Freq: Four times a day (QID) | ORAL | Status: DC | PRN
  Administered 2015-09-06 – 2015-09-08 (×5): 1 via ORAL
  Filled 2015-09-06 (×6): qty 1

## 2015-09-06 MED ORDER — POTASSIUM CHLORIDE IN NACL 20-0.9 MEQ/L-% IV SOLN
INTRAVENOUS | Status: DC
  Filled 2015-09-06 (×3): qty 1000

## 2015-09-06 MED ORDER — METHOTREXATE SODIUM CHEMO INJECTION 50 MG/2ML: 50.0000 mg | INTRAMUSCULAR | Status: DC

## 2015-09-06 MED ORDER — ASPIRIN EC 81 MG PO TBEC
81.0000 mg | DELAYED_RELEASE_TABLET | Freq: Every day | ORAL | Status: DC
Start: 2015-09-06 — End: 2015-09-08
  Administered 2015-09-06 – 2015-09-08 (×3): 81 mg via ORAL
  Filled 2015-09-06 (×3): qty 1

## 2015-09-06 MED ORDER — INSULIN ASPART 100 UNIT/ML ~~LOC~~ SOLN
0.0000 [IU] | Freq: Every day | SUBCUTANEOUS | Status: DC
  Administered 2015-09-06 – 2015-09-07 (×2): 2 [IU] via SUBCUTANEOUS
  Filled 2015-09-06 (×2): qty 2

## 2015-09-06 MED ORDER — ONDANSETRON HCL 4 MG PO TABS
4.0000 mg | ORAL_TABLET | Freq: Four times a day (QID) | ORAL | Status: DC | PRN
  Administered 2015-09-06: 4 mg via ORAL
  Filled 2015-09-06: qty 1

## 2015-09-06 MED ORDER — OXYCODONE HCL 5 MG PO TABS
5.0000 mg | ORAL_TABLET | Freq: Four times a day (QID) | ORAL | Status: DC | PRN
  Administered 2015-09-07 – 2015-09-08 (×6): 5 mg via ORAL
  Filled 2015-09-06 (×6): qty 1

## 2015-09-06 MED ORDER — IPRATROPIUM-ALBUTEROL 0.5-2.5 (3) MG/3ML IN SOLN: 3.0000 mL | Freq: Four times a day (QID) | RESPIRATORY_TRACT | Status: DC | PRN

## 2015-09-06 MED ORDER — QUETIAPINE FUMARATE 25 MG PO TABS: 25.0000 mg | ORAL_TABLET | Freq: Every day | ORAL | Status: DC

## 2015-09-06 MED ORDER — DEXTROSE 5 % IV SOLN
1.0000 g | INTRAVENOUS | Status: DC
  Administered 2015-09-06 – 2015-09-08 (×3): 1 g via INTRAVENOUS
  Filled 2015-09-06 (×4): qty 10

## 2015-09-06 MED ORDER — QUETIAPINE FUMARATE 25 MG PO TABS
50.0000 mg | ORAL_TABLET | Freq: Every day | ORAL | Status: DC
  Administered 2015-09-06 – 2015-09-07 (×2): 50 mg via ORAL
  Filled 2015-09-06 (×2): qty 2

## 2015-09-06 MED ORDER — VITAMIN C 500 MG PO TABS
500.0000 mg | ORAL_TABLET | Freq: Every day | ORAL | Status: DC
  Administered 2015-09-06 – 2015-09-08 (×3): 500 mg via ORAL
  Filled 2015-09-06 (×3): qty 1

## 2015-09-06 MED ORDER — OXYCODONE-ACETAMINOPHEN 10-325 MG PO TABS: 1.0000 | ORAL_TABLET | Freq: Four times a day (QID) | ORAL | Status: DC | PRN

## 2015-09-06 MED ORDER — ENOXAPARIN SODIUM 40 MG/0.4ML ~~LOC~~ SOLN
40.0000 mg | SUBCUTANEOUS | Status: DC
  Administered 2015-09-06 – 2015-09-07 (×3): 40 mg via SUBCUTANEOUS
  Filled 2015-09-06 (×3): qty 0.4

## 2015-09-06 MED ORDER — ACETAMINOPHEN 650 MG RE SUPP: 650.0000 mg | Freq: Four times a day (QID) | RECTAL | Status: DC | PRN

## 2015-09-06 MED ORDER — PANTOPRAZOLE SODIUM 40 MG PO TBEC
40.0000 mg | DELAYED_RELEASE_TABLET | Freq: Every day | ORAL | Status: DC
  Administered 2015-09-06 – 2015-09-08 (×3): 40 mg via ORAL
  Filled 2015-09-06 (×3): qty 1

## 2015-09-06 MED ORDER — OXYCODONE-ACETAMINOPHEN 5-325 MG PO TABS
1.0000 | ORAL_TABLET | Freq: Four times a day (QID) | ORAL | Status: DC | PRN
  Administered 2015-09-06: 1 via ORAL
  Filled 2015-09-06: qty 1

## 2015-09-06 MED ORDER — POTASSIUM CHLORIDE IN NACL 20-0.9 MEQ/L-% IV SOLN
INTRAVENOUS | Status: AC
  Filled 2015-09-06: qty 1000

## 2015-09-06 MED ORDER — HALOPERIDOL LACTATE 5 MG/ML IJ SOLN
1.0000 mg | INTRAMUSCULAR | Status: AC
  Administered 2015-09-06: 1 mg via INTRAVENOUS
  Filled 2015-09-06: qty 1

## 2015-09-06 MED ORDER — TRAZODONE HCL 50 MG PO TABS: 50.0000 mg | ORAL_TABLET | Freq: Every evening | ORAL | Status: DC | PRN

## 2015-09-06 MED ORDER — CALCIUM-VITAMIN D-MINERALS 600-400 MG-UNIT PO CHEW: 2.0000 | CHEWABLE_TABLET | Freq: Two times a day (BID) | ORAL | Status: DC

## 2015-09-06 MED ORDER — DULOXETINE HCL 30 MG PO CPEP
30.0000 mg | ORAL_CAPSULE | Freq: Every day | ORAL | Status: DC
Start: 2015-09-06 — End: 2015-09-08
  Administered 2015-09-06 – 2015-09-07 (×2): 30 mg via ORAL
  Filled 2015-09-06 (×2): qty 1

## 2015-09-06 MED ORDER — CALCIUM CARBONATE-VITAMIN D 500-200 MG-UNIT PO TABS
2.0000 | ORAL_TABLET | Freq: Two times a day (BID) | ORAL | Status: DC
  Administered 2015-09-06 – 2015-09-08 (×4): 2 via ORAL
  Filled 2015-09-06 (×5): qty 2

## 2015-09-06 MED ORDER — SODIUM CHLORIDE 0.9% FLUSH: 3.0000 mL | Freq: Two times a day (BID) | INTRAVENOUS | Status: DC

## 2015-09-06 MED ORDER — DOCUSATE SODIUM 100 MG PO CAPS
100.0000 mg | ORAL_CAPSULE | Freq: Two times a day (BID) | ORAL | Status: DC
  Administered 2015-09-06 – 2015-09-08 (×5): 100 mg via ORAL
  Filled 2015-09-06 (×5): qty 1

## 2015-09-06 MED ORDER — DULOXETINE HCL 60 MG PO CPEP
60.0000 mg | ORAL_CAPSULE | Freq: Every morning | ORAL | Status: DC
  Administered 2015-09-06 – 2015-09-08 (×3): 60 mg via ORAL
  Filled 2015-09-06: qty 2
  Filled 2015-09-06: qty 1
  Filled 2015-09-06: qty 2
  Filled 2015-09-06: qty 1

## 2015-09-06 MED ORDER — METFORMIN HCL 500 MG PO TABS
500.0000 mg | ORAL_TABLET | Freq: Two times a day (BID) | ORAL | Status: DC
  Administered 2015-09-06 (×2): 500 mg via ORAL
  Filled 2015-09-06 (×2): qty 1

## 2015-09-06 MED ORDER — ONDANSETRON HCL 4 MG/2ML IJ SOLN: 4.0000 mg | Freq: Four times a day (QID) | INTRAMUSCULAR | Status: DC | PRN

## 2015-09-06 MED ORDER — ACETAMINOPHEN 325 MG PO TABS: 650.0000 mg | ORAL_TABLET | Freq: Four times a day (QID) | ORAL | Status: DC | PRN

## 2015-09-06 MED ORDER — MOMETASONE FURO-FORMOTEROL FUM 100-5 MCG/ACT IN AERO
2.0000 | INHALATION_SPRAY | Freq: Two times a day (BID) | RESPIRATORY_TRACT | Status: DC
  Administered 2015-09-06 – 2015-09-07 (×4): 2 via RESPIRATORY_TRACT
  Filled 2015-09-06: qty 8.8

## 2015-09-06 MED ORDER — INSULIN ASPART 100 UNIT/ML ~~LOC~~ SOLN
0.0000 [IU] | Freq: Three times a day (TID) | SUBCUTANEOUS | Status: DC
  Administered 2015-09-06: 3 [IU] via SUBCUTANEOUS
  Administered 2015-09-06 (×2): 5 [IU] via SUBCUTANEOUS
  Administered 2015-09-07 – 2015-09-08 (×4): 3 [IU] via SUBCUTANEOUS
  Filled 2015-09-06 (×3): qty 3
  Filled 2015-09-06: qty 5
  Filled 2015-09-06 (×2): qty 3
  Filled 2015-09-06: qty 5

## 2015-09-06 MED ORDER — OXYCODONE-ACETAMINOPHEN 5-325 MG PO TABS
ORAL_TABLET | ORAL | Status: AC
  Administered 2015-09-06: 1
  Filled 2015-09-06: qty 1

## 2015-09-06 MED ORDER — GABAPENTIN 300 MG PO CAPS: 600.0000 mg | ORAL_CAPSULE | Freq: Three times a day (TID) | ORAL | Status: DC | PRN

## 2015-09-06 MED ORDER — OXYCODONE HCL 5 MG PO TABS
ORAL_TABLET | ORAL | Status: AC
  Administered 2015-09-06: 5 mg
  Filled 2015-09-06: qty 1

## 2015-09-06 MED ORDER — OXYCODONE HCL 5 MG PO TABS: 5.0000 mg | ORAL_TABLET | Freq: Four times a day (QID) | ORAL | Status: DC | PRN

## 2015-09-06 MED ORDER — HALOPERIDOL LACTATE 5 MG/ML IJ SOLN
1.0000 mg | INTRAMUSCULAR | Status: DC | PRN
  Administered 2015-09-07 – 2015-09-08 (×2): 1 mg via INTRAVENOUS
  Filled 2015-09-06 (×2): qty 1

## 2015-09-06 MED ORDER — HALOPERIDOL 0.5 MG PO TABS: 1.0000 mg | ORAL_TABLET | Freq: Four times a day (QID) | ORAL | Status: DC | PRN

## 2015-09-06 MED ORDER — FLUTICASONE FUROATE-VILANTEROL 100-25 MCG/INH IN AEPB: 1.0000 | INHALATION_SPRAY | Freq: Every morning | RESPIRATORY_TRACT | Status: DC

## 2015-09-06 NOTE — Consult Note (Signed)
[facility] Face-to-Face Psychiatry Consult   Reason for Consult:  Consult for this 69-year-old woman with rheumatoid arthritis chronic pain and multiple medical problems now with diabetes. Concern about hallucinations and confusion Referring Physician:  [place] Patient Identification: Michelle Cabrera MRN:  [id number] Principal Diagnosis: New onset type 2 diabetes mellitus (HCC) Diagnosis:   Patient Active Problem List   Diagnosis Date Noted  . New onset type 2 diabetes mellitus (HCC) [E11.9] 09/06/2015  . Altered mental status [R41.82] 09/06/2015  . COPD exacerbation (HCC) [J44.1] 07/26/2015  . Arthritis associated with another disorder [M19.90] 09/15/2014  . Wedge compression fracture of T6 vertebra (HCC) [S22.050A] 02/07/2014  . Intractable low back pain [M54.5] 12/27/2012  . DDD (degenerative disc disease) [IMO0002] 12/27/2012  . Rheumatoid arthritis (HCC) [M06.9] 12/27/2012  . Hypertension [I10] 12/27/2012    Total Time spent with patient: 45 minutes  Subjective:   Michelle Cabrera is a 69 y.o. female patient admitted with "it's just all of those children".  HPI:  Patient interviewed. Spoke with the husband and sister who were in the room as well. Chart reviewed. 69-year-old woman with multiple medical problems was brought into the hospital this time for worsening confusion and also for polydipsia and other symptoms indicative of diabetes. Found to have very elevated blood sugars. Patient has been confused during her hospital stay. The patient is not able to give much useful information. On interview she rapidly becomes tangential and confused and delirious talking about things that don't make any sense. She does indicate that she's been having visual hallucinations talks about seeing bunny rabbits in the room. She gets emotionally upset several times talking about things that don't make much sense. Seems to be upset that "those kids" are "fussing and cussing". Patient does indicate that she's  been sleeping poorly. Denies suicidal ideation. Family indicates that she's been confused for a while at home and here in the hospital it seems to be worse. She is asking to be taken out of the hospital and doesn't understand her medical condition. Patient has been given some oral haloperidol. Not sure if it's made much of a difference so far. Patient is being prescribed antidepressant medicine which seems to be chronic. Probably in part for her pain as well as for mood and anxiety. Other than her medical illness no clear-cut stresses. The patient vaguely talks about problems with her husband but she is so confused I can't make anything out of it.  Medical history: Rheumatoid arthritis. Chronic pain. Now diagnosed with diabetes.  Substance abuse history: I don't see any documentation of any concern in the past about substance abuse problems. No evidence of alcohol abuse or other drug abuse.  Social history: Patient lives with her husband. He is present in the room and appears to be appropriate and concerned about the whole situation. Patient vaguely talks about problems with her husband but doesn't make any specific allegations and I don't have any reason to think that's a major part of the acute problem.  Past Psychiatric History: Patient tells me that she has seen a psychiatrist in the past and been in a psychiatric hospital but that was many years ago when she won't go into detail. I don't see anything on her current chart to confirm any of that. No history of suicidal ideation and no history of other psychotic disorders.  Risk to Self: Is patient at risk for suicide?: No Risk to Others:   Prior Inpatient Therapy:   Prior Outpatient Therapy:      Past Medical History:  Past Medical History  Diagnosis Date  . Hypertension   . Closed compression fracture of L1 lumbar vertebra ([place]) 11/2012  . COPD (chronic obstructive pulmonary disease) ([place])   . Anxiety   . Depression   . GERD (gastroesophageal  reflux disease)   . Rheumatoid arthritis ([place])   . DDD (degenerative disc disease)   . Chronic lower back pain   . Collagen vascular disease [facility])     Past Surgical History  Procedure Laterality Date  . Lumbar disc surgery       X 3  . Kyphoplasty N/A 01/04/2013    Procedure: L1 Kyphoplasty;  Surgeon: Hosie Spangle, MD;  Location: [place] [facility];  Service: Neurosurgery;  Laterality: N/A;  L1 Kyphoplasty  . Tubal ligation    . Foot surgery    . Wrist fusion Right     30 yrs ago  . Kyphoplasty N/A 02/07/2014    Procedure: THORACIC SIX KYPHOPLASTY;  Surgeon: Hosie Spangle, MD;  Location: [place] [facility];  Service: Neurosurgery;  Laterality: N/A;  T6 Kyphoplasty   . Hand tendon surgery Right 09/15/2014    "d/t RA"  . Tonsillectomy    . Posterior lumbar fusion      "got screws in"  . Back surgery    . Cataract extraction w/ intraocular lens  implant, bilateral Bilateral   . Finger arthroplasty Right 09/15/2014    Procedure: RIGHT MIDDLE FINGER, RING FINGER, SMALL FINGER EXTENSOR DIGITORUM COMMUMIS STABILIZATION WITH RIGHT MIDDLE FINGER MCP ARTHROPLASTY, POSSIBLE RING FINGER AND SMALL FINGER MCP ARTHROPLASTY;  Surgeon: Roseanne Kaufman, MD;  Location: [place];  Service: Orthopedics;  Laterality: Right;  . Breast biopsy Left     negative   Family History:  Family History  Problem Relation Age of Onset  . CAD Father   . CAD Mother   . Hypertension Mother   . Peripheral vascular disease Mother   . Breast cancer Maternal Aunt 44   Family Psychiatric  History: No known family history can be identified and doesn't seem to be germane to the immediate problem Social History:  History  Alcohol Use No     History  Drug Use No    Social History   Social History  . Marital Status: Married    Spouse Name: N/A  . Number of Children: N/A  . Years of Education: N/A   Occupational History  . retired    Social History Main Topics  . Smoking status: Former Smoker -- 1.00 packs/day for 53  years    Types: Cigarettes  . Smokeless tobacco: Never Used  . Alcohol Use: No  . Drug Use: No  . Sexual Activity: Yes   Other Topics Concern  . None   Social History Narrative   Lives with husband at home   Additional Social History:    Allergies:   Allergies  Allergen Reactions  . Gold-Containing Drug Products Anaphylaxis  . Adhesive [Tape]     Tears skin  . Dilaudid [Hydromorphone Hcl]     Made tongue swell  . Pravastatin     Arms and legs hurt     Labs:  Results for orders placed or performed during the hospital encounter of 09/05/15 (from the past 48 hour(s))  Comprehensive metabolic panel     Status: Abnormal   Collection Time: 09/05/15  9:31 PM  Result Value Ref Range   Sodium 136 135 - 145 mmol/L   Potassium 2.4 (LL) 3.5 - 5.1 mmol/L  Comment: CRITICAL RESULT CALLED TO, READ BACK BY AND VERIFIED WITH SAMANTHA ASHLEY AT 2220 09/05/15 MLZ    Chloride 96 (L) 101 - 111 mmol/L   CO2 31 22 - 32 mmol/L   Glucose, Bld 382 (H) 65 - 99 mg/dL   BUN 10 6 - 20 mg/dL   Creatinine, Ser 0.49 0.44 - 1.00 mg/dL   Calcium 7.6 (L) 8.9 - 10.3 mg/dL   Total Protein 5.8 (L) 6.5 - 8.1 g/dL   Albumin 2.0 (L) 3.5 - 5.0 g/dL   AST 24 15 - 41 U/L   ALT 20 14 - 54 U/L   Alkaline Phosphatase 401 (H) 38 - 126 U/L   Total Bilirubin 0.7 0.3 - 1.2 mg/dL   GFR calc non Af Amer >60 >60 mL/min   GFR calc Af Amer >60 >60 mL/min    Comment: (NOTE) The eGFR has been calculated using the CKD EPI equation. This calculation has not been validated in all clinical situations. eGFR's persistently <60 mL/min signify possible Chronic Kidney Disease.    Anion gap 9 5 - 15  CBC     Status: Abnormal   Collection Time: 09/05/15  9:31 PM  Result Value Ref Range   WBC 9.1 3.6 - 11.0 K/uL   RBC 3.89 3.80 - 5.20 MIL/uL   Hemoglobin 11.6 (L) 12.0 - 16.0 g/dL   HCT 34.9 (L) 35.0 - 47.0 %   MCV 89.7 80.0 - 100.0 fL   MCH 29.9 26.0 - 34.0 pg   MCHC 33.4 32.0 - 36.0 g/dL   RDW 19.6 (H) 11.5 - 14.5 %    Platelets 382 150 - 440 K/uL  Glucose, capillary     Status: Abnormal   Collection Time: 09/05/15  9:34 PM  Result Value Ref Range   Glucose-Capillary 369 (H) 65 - 99 mg/dL  Urinalysis complete, with microscopic ([facility] only)     Status: Abnormal   Collection Time: 09/05/15 11:12 PM  Result Value Ref Range   Color, Urine YELLOW (A) YELLOW   APPearance CLOUDY (A) CLEAR   Glucose, UA >500 (A) NEGATIVE mg/dL   Bilirubin Urine NEGATIVE NEGATIVE   Ketones, ur NEGATIVE NEGATIVE mg/dL   Specific Gravity, Urine 1.031 (H) 1.005 - 1.030   Hgb urine dipstick 1+ (A) NEGATIVE   pH 7.0 5.0 - 8.0   Protein, ur NEGATIVE NEGATIVE mg/dL   Nitrite NEGATIVE NEGATIVE   Leukocytes, UA 2+ (A) NEGATIVE   RBC / HPF 6-30 0 - 5 RBC/hpf   WBC, UA TOO NUMEROUS TO COUNT 0 - 5 WBC/hpf   Bacteria, UA NONE SEEN NONE SEEN   Squamous Epithelial / LPF 6-30 (A) NONE SEEN   WBC Clumps PRESENT    Budding Yeast PRESENT   Blood gas, arterial     Status: Abnormal   Collection Time: 09/06/15 12:25 AM  Result Value Ref Range   FIO2 0.30    Delivery systems NASAL CANNULA    pH, Arterial 7.47 (H) 7.350 - 7.450   pCO2 arterial 45 32.0 - 48.0 mmHg   pO2, Arterial 81 (L) 83.0 - 108.0 mmHg   Bicarbonate 32.8 (H) 21.0 - 28.0 mEq/L   Acid-Base Excess 8.0 (H) 0.0 - 3.0 mmol/L   O2 Saturation 96.6 %   Patient temperature 37.0    Collection site RIGHT RADIAL    Sample type ARTERIAL DRAW    Allens test (pass/fail) POSITIVE (A) PASS  Glucose, capillary     Status: Abnormal   Collection Time: 09/06/15    1:31 AM  Result Value Ref Range   Glucose-Capillary 244 (H) 65 - 99 mg/dL  Glucose, capillary     Status: Abnormal   Collection Time: 09/06/15  3:58 AM  Result Value Ref Range   Glucose-Capillary 224 (H) 65 - 99 mg/dL  Creatinine, serum     Status: Abnormal   Collection Time: 09/06/15  5:55 AM  Result Value Ref Range   Creatinine, Ser 0.40 (L) 0.44 - 1.00 mg/dL   GFR calc non Af Amer >60 >60 mL/min   GFR calc Af Amer >60  >60 mL/min    Comment: (NOTE) The eGFR has been calculated using the CKD EPI equation. This calculation has not been validated in all clinical situations. eGFR's persistently <60 mL/min signify possible Chronic Kidney Disease.   Basic metabolic panel     Status: Abnormal   Collection Time: 09/06/15  5:55 AM  Result Value Ref Range   Sodium 137 135 - 145 mmol/L   Potassium 3.2 (L) 3.5 - 5.1 mmol/L   Chloride 105 101 - 111 mmol/L   CO2 26 22 - 32 mmol/L   Glucose, Bld 244 (H) 65 - 99 mg/dL   BUN 9 6 - 20 mg/dL   Creatinine, Ser 0.37 (L) 0.44 - 1.00 mg/dL   Calcium 6.7 (L) 8.9 - 10.3 mg/dL   GFR calc non Af Amer >60 >60 mL/min   GFR calc Af Amer >60 >60 mL/min    Comment: (NOTE) The eGFR has been calculated using the CKD EPI equation. This calculation has not been validated in all clinical situations. eGFR's persistently <60 mL/min signify possible Chronic Kidney Disease.    Anion gap 6 5 - 15  CBC     Status: Abnormal   Collection Time: 09/06/15  5:55 AM  Result Value Ref Range   WBC 8.8 3.6 - 11.0 K/uL   RBC 3.34 (L) 3.80 - 5.20 MIL/uL   Hemoglobin 10.0 (L) 12.0 - 16.0 g/dL   HCT 30.1 (L) 35.0 - 47.0 %   MCV 90.1 80.0 - 100.0 fL   MCH 29.9 26.0 - 34.0 pg   MCHC 33.1 32.0 - 36.0 g/dL   RDW 19.8 (H) 11.5 - 14.5 %   Platelets 350 150 - 440 K/uL  Hemoglobin A1c     Status: Abnormal   Collection Time: 09/06/15  5:55 AM  Result Value Ref Range   Hgb A1c MFr Bld 11.5 (H) 4.0 - 6.0 %  Glucose, capillary     Status: Abnormal   Collection Time: 09/06/15  7:31 AM  Result Value Ref Range   Glucose-Capillary 226 (H) 65 - 99 mg/dL   Comment 1 Notify RN   Glucose, capillary     Status: Abnormal   Collection Time: 09/06/15 11:39 AM  Result Value Ref Range   Glucose-Capillary 216 (H) 65 - 99 mg/dL   Comment 1 Notify RN   Glucose, capillary     Status: Abnormal   Collection Time: 09/06/15  4:41 PM  Result Value Ref Range   Glucose-Capillary 163 (H) 65 - 99 mg/dL   Comment 1  Notify RN     Current Facility-Administered Medications  Medication Dose Route Frequency Provider Last Rate Last Dose  . 0.9 % NaCl with KCl 20 mEq/ L  infusion   Intravenous Continuous Richard Wieting, MD      . acetaminophen (TYLENOL) tablet 650 mg  650 mg Oral Q6H PRN Pavan Pyreddy, MD       Or  . acetaminophen (TYLENOL)   suppository 650 mg  650 mg Rectal Q6H PRN Saundra Shelling, MD      . aspirin EC tablet 81 mg  81 mg Oral Daily Saundra Shelling, MD   81 mg at 09/06/15 0931  . calcium-vitamin D (OSCAL WITH D) 500-200 MG-UNIT per tablet 2 tablet  2 tablet Oral BID Saundra Shelling, MD   2 tablet at 09/06/15 0931  . cefTRIAXone (ROCEPHIN) 1 g in dextrose 5 % 50 mL IVPB  1 g Intravenous Q24H Loletha Grayer, MD   1 g at 09/06/15 1222  . docusate sodium (COLACE) capsule 100 mg  100 mg Oral BID Saundra Shelling, MD   100 mg at 09/06/15 0931  . DULoxetine (CYMBALTA) DR capsule 30 mg  30 mg Oral QHS Pavan Pyreddy, MD      . DULoxetine (CYMBALTA) DR capsule 60 mg  60 mg Oral q morning - 10a Pavan Pyreddy, MD   60 mg at 09/06/15 0931  . enoxaparin (LOVENOX) injection 40 mg  40 mg Subcutaneous Q24H Pavan Pyreddy, MD   40 mg at 09/06/15 0930  . gabapentin (NEURONTIN) capsule 600 mg  600 mg Oral TID PRN Saundra Shelling, MD      . haloperidol (HALDOL) tablet 1 mg  1 mg Oral Q6H PRN Loletha Grayer, MD      . haloperidol lactate (HALDOL) injection 1 mg  1 mg Intravenous STAT John T Clapacs, MD      . haloperidol lactate (HALDOL) injection 1 mg  1 mg Intravenous Q4H PRN Gonzella Lex, MD      . insulin aspart (novoLOG) injection 0-15 Units  0-15 Units Subcutaneous TID WC Saundra Shelling, MD   3 Units at 09/06/15 1716  . insulin aspart (novoLOG) injection 0-5 Units  0-5 Units Subcutaneous QHS Saundra Shelling, MD   2 Units at 09/06/15 0200  . ipratropium-albuterol (DUONEB) 0.5-2.5 (3) MG/3ML nebulizer solution 3 mL  3 mL Nebulization Q6H PRN Pavan Pyreddy, MD      . metFORMIN (GLUCOPHAGE) tablet 500 mg  500 mg Oral BID WC  Saundra Shelling, MD   500 mg at 09/06/15 1717  . mometasone-formoterol (DULERA) 100-5 MCG/ACT inhaler 2 puff  2 puff Inhalation BID Saundra Shelling, MD   2 puff at 09/06/15 0930  . ondansetron (ZOFRAN) tablet 4 mg  4 mg Oral Q6H PRN Saundra Shelling, MD   4 mg at 09/06/15 1717   Or  . ondansetron (ZOFRAN) injection 4 mg  4 mg Intravenous Q6H PRN Saundra Shelling, MD      . oxyCODONE-acetaminophen (PERCOCET/ROXICET) 5-325 MG per tablet 1 tablet  1 tablet Oral Q6H PRN Loletha Grayer, MD   1 tablet at 09/06/15 1717   And  . oxyCODONE (Oxy IR/ROXICODONE) immediate release tablet 5 mg  5 mg Oral Q6H PRN Loletha Grayer, MD      . pantoprazole (PROTONIX) EC tablet 40 mg  40 mg Oral Daily Saundra Shelling, MD   40 mg at 09/06/15 0931  . potassium chloride (KLOR-CON) packet 40 mEq  40 mEq Oral BID Gregor Hams, MD   40 mEq at 09/06/15 0930  . QUEtiapine (SEROQUEL) tablet 50 mg  50 mg Oral QHS John T Clapacs, MD      . sodium chloride flush (NS) 0.9 % injection 3 mL  3 mL Intravenous Q12H Saundra Shelling, MD   3 mL at 09/06/15 0932  . vitamin C (ASCORBIC ACID) tablet 500 mg  500 mg Oral Daily Pavan Pyreddy, MD   500 mg at  09/06/15 0931    Musculoskeletal: Strength & Muscle Tone: decreased and atrophy Gait & Station: unsteady Patient leans: N/A  Psychiatric Specialty Exam: Review of Systems  Unable to perform ROS: mental status change  Psychiatric/Behavioral: Positive for hallucinations. Negative for depression. The patient is nervous/anxious and has insomnia.     Blood pressure 90/72, pulse 76, temperature 98.9 F (37.2 C), temperature source Oral, resp. rate 22, height 5' 2" (1.575 m), weight 65.499 kg (144 lb 6.4 oz), SpO2 90 %.Body mass index is 26.4 kg/(m^2).  General Appearance: Disheveled  Eye Contact::  Minimal  Speech:  Garbled, Slow and Slurred  Volume:  Decreased  Mood:  Dysphoric and Irritable  Affect:  Inappropriate  Thought Process:  Loose and Tangential  Orientation:  Other:  Patient was  not able to tell me where she was. She eventually could read it off of the white board but even then was unable to make sense of her situation. She did know the year correctly. She went in and out as to whether she was oriented to the actual situation. By the end of the interview was clear that she was once again completely confused.  Thought Content:  Delusions and Hallucinations: Visual  Suicidal Thoughts:  No  Homicidal Thoughts:  No  Memory:  Immediate;   Poor Recent;   Poor Remote;   Poor  Judgement:  Impaired  Insight:  Lacking  Psychomotor Activity:  Decreased  Concentration:  Poor  Recall:  Poor  Fund of Knowledge:Fair  Language: Fair  Akathisia:  No  Handed:  Right  AIMS (if indicated):     Assets:  Housing Social Support  ADL's:  Impaired  Cognition: Impaired,  Moderate  Sleep:      Treatment Plan Summary: Daily contact with patient to assess and evaluate symptoms and progress in treatment, Medication management and Plan This is a 70 year old woman with multiple medical problems who is currently delirious. She has a waxing and waning mental status with confusion characterized by visual hallucinations disorientation inappropriate behavior mood lability. All of this appears to be relatively new onset. Patient appears to of been developing diabetes over the last 1-2 months from what I can see if her labs. Certainly the new onset diabetes can be one cause of the delirium. Any kind of infection can as well. Multiple medicines can contribute to this. A kind of delirium she has is hard to pin down and is probably multifactorial. My suggestion is that we had intravenous haloperidol and I've ordered 1 mg stat and 1 mg every 4 hours as needed. I will increase her nighttime Seroquel dose to 50 mg. Follow-up in the hospital. I would be very concerned about her being discharged in her current condition with her confusion and agitation she is unlikely to be able to cooperate with any care and  certainly would not have any ability to make judgments right now. We'll follow-up as needed.  Disposition: Supportive therapy provided about ongoing stressors. See note above  Alethia Berthold, MD 09/06/2015 5:46 PM

## 2015-09-06 NOTE — ED Notes (Signed)
Patient transported to CT 

## 2015-09-06 NOTE — Progress Notes (Signed)
Patient ID: Michelle Cabrera, female   DOB: 22-Sep-1945, 70 y.o.   MRN: [id number] [facility] PROGRESS NOTE  Michelle Cabrera [id number]:[id number] DOB: 07-11-1946 DOA: 09/05/2015 PCP: Lauro Regulus., MD  HPI/Subjective: Patient crying at times and then inappropriate at times. Patient lives at home with her husband. Family concerned about the high sugars and patient also hallucinating. Patient in chronic pain with her rheumatoid arthritis and severe pain with her right shoulder.  Objective: Filed Vitals:   09/06/15 0435 09/06/15 1219  BP: 110/62 90/72  Pulse: 106 76  Temp: 98.9 F (37.2 C) 98.9 F (37.2 C)  Resp: 22 22    Filed Weights   09/05/15 2120 09/06/15 0433  Weight: 63.504 kg (140 lb) 65.499 kg (144 lb 6.4 oz)    ROS: Review of Systems  Constitutional: Negative for fever and chills.  Eyes: Negative for blurred vision.  Respiratory: Negative for cough and shortness of breath.   Cardiovascular: Negative for chest pain.  Gastrointestinal: Negative for nausea, vomiting, abdominal pain, diarrhea and constipation.  Genitourinary: Negative for dysuria.  Musculoskeletal: Positive for back pain and joint pain.  Neurological: Negative for dizziness and headaches.   Exam: Physical Exam  HENT:  Nose: No mucosal edema.  Mouth/Throat: No oropharyngeal exudate or posterior oropharyngeal edema.  Eyes: Conjunctivae, EOM and lids are normal. Pupils are equal, round, and reactive to light.  Neck: No JVD present. Carotid bruit is not present. No edema present. No thyroid mass and no thyromegaly present.  Cardiovascular: S1 normal and S2 normal.  Exam reveals no gallop.   No murmur heard. Pulses:      Dorsalis pedis pulses are 2+ on the right side, and 2+ on the left side.  Respiratory: No respiratory distress. She has no wheezes. She has no rhonchi. She has no rales.  GI: Soft. Bowel sounds are normal. There is no tenderness.  Musculoskeletal:       Right ankle: She  exhibits no swelling.       Left ankle: She exhibits no swelling.  Lymphadenopathy:    She has no cervical adenopathy.  Neurological: She is alert.  Skin: Skin is warm. Nails show no clubbing.  As per nursing staff decubitus ulcer on buttock  Psychiatric: She has a normal mood and affect.      Data Reviewed: Basic Metabolic Panel:  Recent Labs Lab 09/05/15 2131 09/06/15 0555  NA 136 137  K 2.4* 3.2*  CL 96* 105  CO2 31 26  GLUCOSE 382* 244*  BUN 10 9  CREATININE 0.49 0.40*  0.37*  CALCIUM 7.6* 6.7*   Liver Function Tests:  Recent Labs Lab 09/05/15 2131  AST 24  ALT 20  ALKPHOS 401*  BILITOT 0.7  PROT 5.8*  ALBUMIN 2.0*   CBC:  Recent Labs Lab 09/05/15 2131 09/06/15 0555  WBC 9.1 8.8  HGB 11.6* 10.0*  HCT 34.9* 30.1*  MCV 89.7 90.1  PLT 382 350    CBG:  Recent Labs Lab 09/05/15 2134 09/06/15 0131 09/06/15 0358 09/06/15 0731 09/06/15 1139  GLUCAP 369* 244* 224* 226* 216*    Studies: Ct Head Wo Contrast  09/06/2015  CLINICAL DATA:  70 year old female with confusion. EXAM: CT HEAD WITHOUT CONTRAST TECHNIQUE: Contiguous axial images were obtained from the base of the skull through the vertex without intravenous contrast. COMPARISON:  Head CT dated 07/29/2015 FINDINGS: There is stable diffuse global atrophy as well as moderate chronic microvascular ischemic disease. There is no intracranial hemorrhage. No mass effect or  midline shift identified. The visualized paranasal sinuses and mastoid air cells are well aerated. The calvarium is intact. IMPRESSION: No acute intracranial hemorrhage. 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 If symptoms persist and there are no contraindications, MRI may provide better evaluation if clinically indicated. Electronically Signed   By: Elgie Collard M.D.   On: 09/06/2015 03:28    Scheduled Meds: . aspirin EC  81 mg Oral Daily  . calcium-vitamin D  2 tablet Oral BID  . cefTRIAXone (ROCEPHIN)   IV  1 g Intravenous Q24H  . docusate sodium  100 mg Oral BID  . DULoxetine  30 mg Oral QHS  . DULoxetine  60 mg Oral q morning - 10a  . enoxaparin (LOVENOX) injection  40 mg Subcutaneous Q24H  . insulin aspart  0-15 Units Subcutaneous TID WC  . insulin aspart  0-5 Units Subcutaneous QHS  . metFORMIN  500 mg Oral BID WC  . mometasone-formoterol  2 puff Inhalation BID  . pantoprazole  40 mg Oral Daily  . potassium chloride  40 mEq Oral BID  . QUEtiapine  25 mg Oral QHS  . sodium chloride flush  3 mL Intravenous Q12H  . vitamin C  500 mg Oral Daily   Continuous Infusions: . 0.9 % NaCl with KCl 20 mEq / L      Assessment/Plan:  1. Acute encephalopathy with hallucinations visual, likely secondary to acute cystitis with hematuria. This was present on admission. Add on a urine culture and start Rocephin. When necessary Haldol for hallucinations and Seroquel at night. I will also get a psychiatric consultation. 2. Chronic pain with rheumatoid arthritis and recent shoulder fracture. When necessary pain medication. Patient was supposed to have a follow-up appointment with Dr. Martha Clan today. I will put in a consultation. 3. Anxiety and depression continue current psychiatric medications and have a psychiatric evaluation with labile mood 4. Gastroesophageal reflux disease without esophagitis on Protonix 5. 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 Decubitus ulcer local wound care. 7. Weakness will likely need rehabilitation will get social worker involved 8. Rheumatoid arthritis can reorder methotrexate on Fridays. Hold Enbrel for now 9. Hypokalemia- replace in IV fluids  Code Status:     Code Status Orders        Start     Ordered   09/06/15 0405  Full code   Continuous     09/06/15 0404    Code Status History    Date Active Date Inactive Code Status Order ID Comments User Context   07/26/2015  3:51 PM 08/01/2015  4:32 PM Full Code [id number]  Shaune Pollack, MD Inpatient    09/15/2014  6:36 PM 09/16/2014  5:22 PM Full Code [id number]  Dominica Severin, MD Inpatient   02/07/2014 11:04 AM 02/07/2014 10:39 PM Full Code [id number]  Hewitt Shorts, MD Inpatient   12/26/2012 11:54 PM 01/05/2013  3:34 PM Full Code [id number]  Ron Parker, MD ED     Family Communication: Family at bedside Disposition Plan: Likely wound up needing rehabilitation  Consultants:  Psychiatry  Physical therapy  Antibiotics:  Rocephin  Time spent: 35 minutes  Alford Highland  [facility]

## 2015-09-06 NOTE — [place] FL2 (Signed)
[place] MEDICAID FL2 LEVEL OF CARE SCREENING TOOL     IDENTIFICATION  Patient Name: Michelle Cabrera Birthdate: October 04, 1945 Sex: female Admission Date (Current Location): 09/05/2015  [place] and [place] Number:  Chiropodist and Address:  [facility], [address], [place], [place] [address]      Provider Number: [id number]  Attending Physician Name and Address:  Alford Highland, MD  Relative Name and Phone Number:       Current Level of Care: Hospital Recommended Level of Care: Skilled Nursing Facility Prior Approval Number:   [id number]  Date Approved/Denied:   PASRR Number:    Discharge Plan: SNF    Current Diagnoses: Patient Active Problem List   Diagnosis Date Noted  . New onset type 2 diabetes mellitus (HCC) 09/06/2015  . Altered mental status 09/06/2015  . COPD exacerbation (HCC) 07/26/2015  . Arthritis associated with another disorder 09/15/2014  . Wedge compression fracture of T6 vertebra (HCC) 02/07/2014  . Intractable low back pain 12/27/2012  . DDD (degenerative disc disease) 12/27/2012  . Rheumatoid arthritis (HCC) 12/27/2012  . Hypertension 12/27/2012    Orientation RESPIRATION BLADDER Height & Weight     Self, Situation, Place  O2 (Nasal Cannual 4 (L/min) ) Incontinent Weight: 144 lb 6.4 oz (65.499 kg) Height:  5\' 2"  (157.5 cm)  BEHAVIORAL SYMPTOMS/MOOD NEUROLOGICAL BOWEL NUTRITION STATUS   (None )  (None ) Incontinent Diet  AMBULATORY STATUS COMMUNICATION OF NEEDS Skin   Extensive Assist Verbally Normal                       Personal Care Assistance Level of Assistance  Bathing, Feeding, Dressing Bathing Assistance: Limited assistance Feeding assistance: Independent Dressing Assistance: Limited assistance     Functional Limitations Info  Sight, Hearing, Speech Sight Info: Adequate Hearing Info: Adequate Speech Info: Adequate    SPECIAL CARE FACTORS FREQUENCY  PT (By licensed PT)     PT  Frequency:  (5)              Contractures      Additional Factors Info  Insulin Sliding Scale, Code Status, Allergies Code Status Info:  (Full Code ) Allergies Info:  (Gold-containing Drug Products, Adhesive, Dilaudid, Pravastatin)   Insulin Sliding Scale Info:  (insulin aspart (novoLOG) injection 0-15 Units 0-15 Units, Subcutaneous, 3 times daily with meals  & insulin aspart (novoLOG) injection 0-5 Units 0-5 Units, Subcutaneous, Daily at bedtime )       Current Medications (09/06/2015):  This is the current hospital active medication list Current Facility-Administered Medications  Medication Dose Route Frequency Provider Last Rate Last Dose  . 0.9 % NaCl with KCl 20 mEq/ L  infusion   Intravenous Continuous 11/04/2015, MD      . acetaminophen (TYLENOL) tablet 650 mg  650 mg Oral Q6H PRN Alford Highland, MD       Or  . acetaminophen (TYLENOL) suppository 650 mg  650 mg Rectal Q6H PRN Ihor Austin, MD      . aspirin EC tablet 81 mg  81 mg Oral Daily Ihor Austin, MD   81 mg at 09/06/15 0931  . calcium-vitamin D (OSCAL WITH D) 500-200 MG-UNIT per tablet 2 tablet  2 tablet Oral BID 11/04/15, MD   2 tablet at 09/06/15 0931  . cefTRIAXone (ROCEPHIN) 1 g in dextrose 5 % 50 mL IVPB  1 g Intravenous Q24H 11/04/15, MD   1 g at 09/06/15 1222  .  docusate sodium (COLACE) capsule 100 mg  100 mg Oral BID Ihor Austin, MD   100 mg at 09/06/15 0931  . DULoxetine (CYMBALTA) DR capsule 30 mg  30 mg Oral QHS Pavan Pyreddy, MD      . DULoxetine (CYMBALTA) DR capsule 60 mg  60 mg Oral q morning - 10a Pavan Pyreddy, MD   60 mg at 09/06/15 0931  . enoxaparin (LOVENOX) injection 40 mg  40 mg Subcutaneous Q24H Pavan Pyreddy, MD   40 mg at 09/06/15 0930  . gabapentin (NEURONTIN) capsule 600 mg  600 mg Oral TID PRN Ihor Austin, MD      . haloperidol (HALDOL) tablet 1 mg  1 mg Oral Q6H PRN Alford Highland, MD      . insulin aspart (novoLOG) injection 0-15 Units  0-15 Units Subcutaneous TID  WC Ihor Austin, MD   5 Units at 09/06/15 1220  . insulin aspart (novoLOG) injection 0-5 Units  0-5 Units Subcutaneous QHS Ihor Austin, MD   2 Units at 09/06/15 0200  . ipratropium-albuterol (DUONEB) 0.5-2.5 (3) MG/3ML nebulizer solution 3 mL  3 mL Nebulization Q6H PRN Pavan Pyreddy, MD      . metFORMIN (GLUCOPHAGE) tablet 500 mg  500 mg Oral BID WC Ihor Austin, MD   500 mg at 09/06/15 0931  . [START ON 09/08/2015] methotrexate chemo injection 50 mg  50 mg Subcutaneous Weekly Alford Highland, MD      . mometasone-formoterol (DULERA) 100-5 MCG/ACT inhaler 2 puff  2 puff Inhalation BID Ihor Austin, MD   2 puff at 09/06/15 0930  . ondansetron (ZOFRAN) tablet 4 mg  4 mg Oral Q6H PRN Ihor Austin, MD       Or  . ondansetron (ZOFRAN) injection 4 mg  4 mg Intravenous Q6H PRN Ihor Austin, MD      . oxyCODONE-acetaminophen (PERCOCET/ROXICET) 5-325 MG per tablet 1 tablet  1 tablet Oral Q6H PRN Alford Highland, MD       And  . oxyCODONE (Oxy IR/ROXICODONE) immediate release tablet 5 mg  5 mg Oral Q6H PRN Alford Highland, MD      . pantoprazole (PROTONIX) EC tablet 40 mg  40 mg Oral Daily Ihor Austin, MD   40 mg at 09/06/15 0931  . potassium chloride (KLOR-CON) packet 40 mEq  40 mEq Oral BID Darci Current, MD   40 mEq at 09/06/15 0930  . QUEtiapine (SEROQUEL) tablet 25 mg  25 mg Oral QHS Alford Highland, MD      . sodium chloride flush (NS) 0.9 % injection 3 mL  3 mL Intravenous Q12H Ihor Austin, MD   3 mL at 09/06/15 0932  . vitamin C (ASCORBIC ACID) tablet 500 mg  500 mg Oral Daily Ihor Austin, MD   500 mg at 09/06/15 0347     Discharge Medications: Please see discharge summary for a list of discharge medications.  Relevant Imaging Results:  Relevant Lab Results:   Additional Information  (SSN [id number])  Verta Ellen Donica Derouin, LCSW

## 2015-09-06 NOTE — Consult Note (Signed)
WOC wound consult note Reason for Consult:Moisture Associated Skin Damage (Incontinence Associated) to sacrum.  No pressure injury noted today.  Scattered abrasions to bilateral lower extremities from recent fall with shoulder injury. Are dry and intact and require no dressing at this time.  Wound type:MASD to sacrum.  Pressure Ulcer POA: Yes (MASD to sacrum) Measurement: 3 cm x 3 cm erythema to sacrum Wound NIO:EVOJJK Drainage (amount, consistency, odor) none Periwound:intact Dressing procedure/placement/frequency:Cleanse sacrum with soap and water and pat gently dry.  Apply allevyn silicone border foam dressing  Change every 3 days and PRN soilage.  No disposable briefs or underpads while in bed.   Will not follow at this time.  Please re-consult if needed.  Maple Hudson RN BSN CWON Pager [phone]

## 2015-09-06 NOTE — Care Management (Signed)
Patient presents with altered mental status and weakness. CM was not abe to fully assess this day as patient's verbal responses were not related to the questions asked and seemed very confused.  Admitted with new onset of diabetes and there is documented she has incontinence associated skin breakdown on the sacrum.  Physical therapy has seen and has recommended skilled nursing placement, and an FL2 has been completed.

## 2015-09-06 NOTE — Evaluation (Signed)
Physical Therapy Evaluation Patient Details Name: [name] MRN: [id number] DOB: 1945/10/04 Today's Date: 09/06/2015   History of Present Illness  Michelle Cabrera is a 70 y.o. female with a known history of hypertension, COPD, GERD, rheumatoid arthritis, decubitus ulcer, degenerative disc disease presented to the emergency room with weakness and confusion. Patient was accompanied by family members. Patient lives with her husband at home and patient's son and daughter live close by. According to the family members patient has been lethargic and confused for the last couple of days. She has been drinking a lot of fluids and feels thirsty throughout the day. She also urinates a lot and frequently. Has poor oral intake. Patient has a decubitus ulcers and mostly bed bound. Does not ambulate much at home. Patient was oriented to self and place in the emergency room but not completely oriented to time. Blood sugars were high in the emergency room and no history of any diabetes mellitus. Has multiple abrasions over the lower extremities. Patient had a fall 2 weeks ago and had a shoulder fracture for which she saw orthopedic physician who recommended conservative management. No history of any fever or chills. Pt is improved in orientation at time of PT evaluation compared to yesterday. Pt reports only 1 fall in the last 12 months. Pt reports that she was ambulating with rolling walker at home in December 2016. She has had a gradual decline in function with dramatic decline since she fell and fractured R humerus. Family reports that over the last 2 weeks her husband has been lifting and carrying her for transfers.  Clinical Impression  Pt demonstrates severe weakness and deconditioning. She reports that she was last able to ambulate December 2016 and has been essentially maxA or dependent care for the last 2 weeks. Pt requires considerable cues for bed mobility and transfers. She is severely weak and unstable in  standing and unable to attempt ambulation at this time due to balance deficits and weakness. Pt needs SNF placement due to progressive debility which has worsened since RUE fracture 2 weeks ago. Pt will benefit from skilled PT services to address deficits in strength, balance, and mobility in order to return to full function at home.     Follow Up Recommendations SNF    Equipment Recommendations  Other (comment) (Pt may need hemiwalker. TBD at [facility])    Recommendations for Other Services Rehab consult     Precautions / Restrictions Precautions Precautions: Fall Restrictions Weight Bearing Restrictions: Yes RUE Weight Bearing: Non weight bearing      Mobility  Bed Mobility Overal bed mobility: Needs Assistance Bed Mobility: Supine to Sit;Sit to Supine     Supine to sit: Max assist Sit to supine: Max assist   General bed mobility comments: Pt utilizes LUE on bed rails to attempt to assist with transfer. Significant increase in RUE pain with mobility. Pt with gross deconditioning and weakness. Tearful once upright in sitting. Once sitting upright pt placed in L shoulder sling.  Transfers Overall transfer level: Needs assistance Equipment used: 1 person hand held assist Transfers: Sit to/from Stand Sit to Stand: Mod assist         General transfer comment: Pt requires heavy cues for transfers at EOB. Pt with severe instability with leaning posterior and supporting back on knees on bed. Pt unable to maintain upright balance. Attempted standing marches but pt barely able to clear toes prior to losing balance. Gross loss of LE strength/power  Ambulation/Gait  General Gait Details: Unable to attempt/perform  Stairs            Wheelchair Mobility    Modified Rankin (Stroke Patients Only)       Balance Overall balance assessment: Needs assistance Sitting-balance support: No upper extremity supported Sitting balance-Leahy Scale: Fair     Standing  balance support: Single extremity supported Standing balance-Leahy Scale: Poor Standing balance comment: Attempted standing marches but pt unable to clear toes without losing balance                             Pertinent Vitals/Pain Pain Assessment: 0-10 Pain Score: 8  Pain Location: R shoulder. Pt also reports bilateral thigh pain but does not rate Pain Intervention(s): Monitored during session;Patient requesting pain meds-RN notified;Limited activity within patient's tolerance    Home Living Family/patient expects to be discharged to:: Private residence Living Arrangements: Spouse/significant other Available Help at Discharge: Family;Available 24 hours/day Type of Home: House Home Access: Ramped entrance     Home Layout: One level Home Equipment: Walker - 2 wheels;Shower seat;Cane - single point;Bedside commode;Wheelchair - manual      Prior Function Level of Independence: Needs assistance   Gait / Transfers Assistance Needed: Pt has been unable to ambulate or transfer without maxA to dependent assist since December 2016  ADL's / Homemaking Assistance Needed: Requires assist recently from husband with bathing/dressing        Hand Dominance   Dominant Hand: Right    Extremity/Trunk Assessment   Upper Extremity Assessment: RUE deficits/detail;LUE deficits/detail RUE Deficits / Details: RUE immobilized in sling. No AROM performed. LUE appears grossly deconditioned and weak.         Lower Extremity Assessment: Generalized weakness (Deconditioned and weak)         Communication   Communication: No difficulties  Cognition Arousal/Alertness: Awake/alert Behavior During Therapy: WFL for tasks assessed/performed Overall Cognitive Status: Within Functional Limits for tasks assessed                      General Comments      Exercises        Assessment/Plan    PT Assessment Patient needs continued PT services  PT Diagnosis Difficulty  walking;Generalized weakness   PT Problem List Decreased strength;Decreased range of motion;Decreased activity tolerance;Decreased balance;Decreased mobility;Decreased knowledge of use of DME;Decreased safety awareness;Cardiopulmonary status limiting activity  PT Treatment Interventions DME instruction;Gait training;Functional mobility training;Stair training;Therapeutic activities;Therapeutic exercise;Balance training;Cognitive remediation;Neuromuscular re-education;Patient/family education;Wheelchair mobility training   PT Goals (Current goals can be found in the Care Plan section) Acute Rehab PT Goals Patient Stated Goal: Pt would like to return to prior level of function and be able to assist with transfers PT Goal Formulation: With patient/family Time For Goal Achievement: 09/20/15 Potential to Achieve Goals: Fair    Frequency Min 2X/week   Barriers to discharge Other (comment) Severe debility. RUE immobilized secondary to fracture. RUE NWB    Co-evaluation               End of Session Equipment Utilized During Treatment: Gait belt Activity Tolerance: Patient limited by pain Patient left: in bed;with call bell/phone within reach;with bed alarm set;with family/visitor present           Time: 1130-1210 PT Time Calculation (min) (ACUTE ONLY): 40 min   Charges:   PT Evaluation $PT Eval Moderate Complexity: 1 Procedure PT Treatments $Therapeutic Activity: 8-22 mins   PT G  Codes:       Lynnea Maizes PT, DPT   Kadeidra Coryell 09/06/2015, 1:50 PM

## 2015-09-06 NOTE — ED Notes (Signed)
See downtime documentation sheets for [id number]

## 2015-09-06 NOTE — Progress Notes (Signed)
Per MD Wieting ok to DC methotrexare

## 2015-09-06 NOTE — Progress Notes (Signed)
Inpatient Diabetes Program Recommendations  AACE/ADA: New Consensus Statement on Inpatient Glycemic Control (2015)  Target Ranges:  Prepandial:   less than 140 mg/dL      Peak postprandial:   less than 180 mg/dL (1-2 hours)      Critically ill patients:  140 - 180 mg/dL   Review of Glycemic Control:  Results for Michelle, Cabrera (MRN [id number]) as of 09/06/2015 15:11  Ref. Range 09/05/2015 21:34 09/06/2015 01:31 09/06/2015 03:58 09/06/2015 07:31 09/06/2015 11:39  Glucose-Capillary Latest Ref Range: 65-99 mg/dL 349 (H) 179 (H) 150 (H) 226 (H) 216 (H)  Results for CHERIL, SLATTERY (MRN [id number]) as of 09/06/2015 15:11  Ref. Range 07/29/2015 07:21  Hemoglobin A1C Latest Ref Range: 4.0-6.0 % 8.0 (H)    Diabetes history: Type 2 diabetes Outpatient Diabetes medications: None noted Current orders for Inpatient glycemic control:  Novolog moderate tid with meals and HS Inpatient Diabetes Program Recommendations:     Please consider adding Levemir 12 units daily to cover basal insulin needs.    Thanks, Beryl Meager, RN, BC-ADM Inpatient Diabetes Coordinator Pager [phone] (8a-5p)

## 2015-09-06 NOTE — Clinical Social Work Placement (Signed)
   CLINICAL SOCIAL WORK PLACEMENT  NOTE  Date:  09/06/2015  Patient Details  Name: Michelle Cabrera MRN: [id number] Date of Birth: September 29, 1945  Clinical Social Work is seeking post-discharge placement for this patient at the Skilled  Nursing Facility level of care (*CSW will initial, date and re-position this form in  chart as items are completed):  Yes   Patient/family provided with [facility] Clinical Social Work Department's list of facilities offering this level of care within the geographic area requested by the patient (or if unable, by the patient's family).  Yes   Patient/family informed of their freedom to choose among providers that offer the needed level of care, that participate in Medicare, Medicaid or managed care program needed by the patient, have an available bed and are willing to accept the patient.  Yes   Patient/family informed of [facility]'s ownership interest in [facility] and [facility], as well as of the fact that they are under no obligation to receive care at these facilities.  PASRR submitted to EDS on 09/06/15     PASRR number received on       Existing PASRR number confirmed on       FL2 transmitted to all facilities in geographic area requested by pt/family on 09/06/15     FL2 transmitted to all facilities within larger geographic area on       Patient informed that his/her managed care company has contracts with or will negotiate with certain facilities, including the following:            Patient/family informed of bed offers received.  Patient chooses bed at       Physician recommends and patient chooses bed at      Patient to be transferred to   on  .  Patient to be transferred to facility by       Patient family notified on   of transfer.  Name of family member notified:        PHYSICIAN       Additional Comment:    _______________________________________________ Idamae Lusher, LCSW 09/06/2015, 4:24 PM

## 2015-09-06 NOTE — H&P (Signed)
[facility] [place] at [facility]   PATIENT NAME: Michelle Cabrera    MR#:  [id number]  DATE OF BIRTH:  05-06-1946  DATE OF ADMISSION:  09/05/2015  PRIMARY CARE PHYSICIAN: Lauro Regulus., MD   REQUESTING/REFERRING PHYSICIAN:   CHIEF COMPLAINT:   Chief Complaint  Patient presents with  . Altered Mental Status  . Weakness    HISTORY OF PRESENT ILLNESS: Michelle Cabrera  is a 70 y.o. female with a known history of hypertension, COPD, GERD, rheumatoid arthritis, decubitus ulcer, degenerative disc disease presented to the emergency room with weakness and confusion. Patient was accompanied by family members. Patient lives with her husband at home and patient's son and daughter live close by. According to the family members patient has been lethargic and confused for the last couple of days. She has been drinking a lot of fluids and feels thirsty throughout the day. She also urinates a lot and frequently. Has poor oral intake. Patient has a decubitus ulcers and mostly bed bound. Does not ambulate much at home. Patient was oriented to self and place in the emergency room but not completely oriented to time. Blood sugars were high in the emergency room and no history of any diabetes mellitus. Has multiple abrasions over the lower extremities. Patient had a fall 2 weeks ago and had a shoulder fracture for which she saw orthopedic physician who recommended conservative management. No history of any fever or chills. Not much history could be obtained from the patient most of the history obtained from family members.  PAST MEDICAL HISTORY:   Past Medical History  Diagnosis Date  . Hypertension   . Closed compression fracture of L1 lumbar vertebra (HCC) 11/2012  . COPD (chronic obstructive pulmonary disease) (HCC)   . Anxiety   . Depression   . GERD (gastroesophageal reflux disease)   . Rheumatoid arthritis (HCC)   . DDD (degenerative disc disease)   . Chronic lower back  pain   . Collagen vascular disease (HCC)     PAST SURGICAL HISTORY: Past Surgical History  Procedure Laterality Date  . Lumbar disc surgery       X 3  . Kyphoplasty N/A 01/04/2013    Procedure: L1 Kyphoplasty;  Surgeon: Hewitt Shorts, MD;  Location: [facility];  Service: Neurosurgery;  Laterality: N/A;  L1 Kyphoplasty  . Tubal ligation    . Foot surgery    . Wrist fusion Right     30 yrs ago  . Kyphoplasty N/A 02/07/2014    Procedure: THORACIC SIX KYPHOPLASTY;  Surgeon: Hewitt Shorts, MD;  Location: [facility];  Service: Neurosurgery;  Laterality: N/A;  T6 Kyphoplasty   . Hand tendon surgery Right 09/15/2014    "d/t RA"  . Tonsillectomy    . Posterior lumbar fusion      "got screws in"  . Back surgery    . Cataract extraction w/ intraocular lens  implant, bilateral Bilateral   . Finger arthroplasty Right 09/15/2014    Procedure: RIGHT MIDDLE FINGER, RING FINGER, SMALL FINGER EXTENSOR DIGITORUM COMMUMIS STABILIZATION WITH RIGHT MIDDLE FINGER MCP ARTHROPLASTY, POSSIBLE RING FINGER AND SMALL FINGER MCP ARTHROPLASTY;  Surgeon: Dominica Severin, MD;  Location: [facility] OR;  Service: Orthopedics;  Laterality: Right;  . Breast biopsy Left     negative    SOCIAL HISTORY:  Social History  Substance Use Topics  . Smoking status: Former Smoker -- 1.00 packs/day for 53 years    Types: Cigarettes  . Smokeless tobacco:  Never Used  . Alcohol Use: No    FAMILY HISTORY:  Family History  Problem Relation Age of Onset  . CAD Father   . CAD Mother   . Hypertension Mother   . Peripheral vascular disease Mother   . Breast cancer Maternal Aunt 50    DRUG ALLERGIES:  Allergies  Allergen Reactions  . Codeine Other (See Comments)    Other Reaction: lip tounge swell  . Gold-Containing Drug Products Anaphylaxis  . Adhesive [Tape]     Tears skin  . Dilaudid [Hydromorphone Hcl]     Made tongue swell  . Pravastatin     Arms and legs hurt     REVIEW OF SYSTEMS:   CONSTITUTIONAL: No  fever, has weakness.  EYES: No blurred or double vision.  EARS, NOSE, AND THROAT: No tinnitus or ear pain.  RESPIRATORY: No cough, shortness of breath, wheezing or hemoptysis.  CARDIOVASCULAR: No chest pain, orthopnea, edema.  GASTROINTESTINAL: No nausea, vomiting, diarrhea, mild abdominal pain present.  GENITOURINARY: No dysuria, hematuria. Has polyuria ENDOCRINE: Has polyuria, nocturia,  HEMATOLOGY: No anemia, easy bruising or bleeding SKIN:dry scabs over the lower extremities. MUSCULOSKELETAL: No joint pain or arthritis.   NEUROLOGIC: No tingling, numbness, weakness.  PSYCHIATRY: no depression  MEDICATIONS AT HOME:  Prior to Admission medications   Medication Sig Start Date End Date Taking? Authorizing Provider  albuterol (PROVENTIL HFA;VENTOLIN HFA) 108 (90 Base) MCG/ACT inhaler Inhale 2 puffs into the lungs every 6 (six) hours as needed for wheezing or shortness of breath.   Yes Historical Provider, MD  aspirin (ASPIRIN EC) 81 MG EC tablet Take 81 mg by mouth daily. Swallow whole.   Yes Historical Provider, MD  Calcium Carbonate-Vit D-Min (CALCIUM-VITAMIN D-MINERALS) 600-400 MG-UNIT CHEW Chew 2 tablets by mouth 2 (two) times daily.    Yes Historical Provider, MD  DULoxetine (CYMBALTA) 30 MG capsule Take 30 mg by mouth at bedtime.    Yes Historical Provider, MD  DULoxetine (CYMBALTA) 60 MG capsule Take 60 mg by mouth every morning.   Yes Historical Provider, MD  etanercept (ENBREL) 50 MG/ML injection Inject 50 mg into the skin once a week. Friday   Yes Historical Provider, MD  Fluticasone Furoate-Vilanterol (BREO ELLIPTA) 100-25 MCG/INH AEPB Inhale 1 puff into the lungs every morning.   Yes Historical Provider, MD  gabapentin (NEURONTIN) 300 MG capsule Take 600 mg by mouth 3 (three) times daily as needed (leg pain).    Yes Historical Provider, MD  ibuprofen (ADVIL,MOTRIN) 200 MG tablet Take 400 mg by mouth every 6 (six) hours as needed for mild pain.   Yes Historical Provider, MD   ipratropium-albuterol (DUONEB) 0.5-2.5 (3) MG/3ML SOLN Take 3 mLs by nebulization every 6 (six) hours as needed (SOB).    Yes Historical Provider, MD  Menthol-Methyl Salicylate (BEN GAY ARTHRITIS FORMULA) 8-30 % CREA Apply 1 application topically 2 (two) times daily as needed (for back pain).    Yes Historical Provider, MD  omeprazole (PRILOSEC) 20 MG capsule Take 20 mg by mouth 2 (two) times daily.    Yes Historical Provider, MD  oxyCODONE-acetaminophen (PERCOCET) 10-325 MG tablet Take 1 tablet by mouth every 6 (six) hours as needed for pain.   Yes Historical Provider, MD  traZODone (DESYREL) 50 MG tablet Take 50-100 mg by mouth at bedtime as needed for sleep.    Yes Historical Provider, MD  vitamin C (ASCORBIC ACID) 500 MG tablet Take 500 mg by mouth daily.   Yes Historical Provider, MD  zoledronic  acid (RECLAST) 5 MG/100ML SOLN injection Inject 5 mg into the vein See admin instructions. *uses once a year*   Yes Historical Provider, MD  Methotrexate, PF, 25 MG/0.4ML SOAJ Inject 50 mg into the skin once a week. Friday    Historical Provider, MD      PHYSICAL EXAMINATION:   VITAL SIGNS: Blood pressure 90/72, pulse 101, temperature 97.9 F (36.6 C), temperature source Oral, resp. rate 19, height 5\' 2"  (1.575 m), weight 63.504 kg (140 lb), SpO2 98 %.  GENERAL:  70 y.o.-year-old patient lying in the bed with mild acute distress.  EYES: Pupils equal, round, reactive to light and accommodation. No scleral icterus. Extraocular muscles intact.  HEENT: Head atraumatic, normocephalic. Oropharynx dry and nasopharynx clear.  NECK:  Supple, no jugular venous distention. No thyroid enlargement, no tenderness.  LUNGS: Normal breath sounds bilaterally, no wheezing, rales,rhonchi or crepitation. No use of accessory muscles of respiration.  CARDIOVASCULAR: S1, S2 normal. No murmurs, rubs, or gallops.  ABDOMEN: Soft, mil;d tenderness noted, nondistended. Bowel sounds present. No organomegaly or mass.   EXTREMITIES: No pedal edema, cyanosis, or clubbing.  NEUROLOGIC: Cranial nerves II through XII are intact. Muscle strength 5/5 in all extremities. Sensation intact. Gait not checked.  PSYCHIATRIC: The patient is alert and oriented x 2 SKIN : decubitus ulcers noted.  LABORATORY PANEL:   CBC  Recent Labs Lab 09/05/15 2131  WBC 9.1  HGB 11.6*  HCT 34.9*  PLT 382  MCV 89.7  MCH 29.9  MCHC 33.4  RDW 19.6*   ------------------------------------------------------------------------------------------------------------------  Chemistries   Recent Labs Lab 09/05/15 2131  NA 136  K 2.4*  CL 96*  CO2 31  GLUCOSE 382*  BUN 10  CREATININE 0.49  CALCIUM 7.6*  AST 24  ALT 20  ALKPHOS 401*  BILITOT 0.7   ------------------------------------------------------------------------------------------------------------------ estimated creatinine clearance is 58.1 mL/min (by C-G formula based on Cr of 0.49). ------------------------------------------------------------------------------------------------------------------ No results for input(s): TSH, T4TOTAL, T3FREE, THYROIDAB in the last 72 hours.  Invalid input(s): FREET3   Coagulation profile No results for input(s): INR, PROTIME in the last 168 hours. ------------------------------------------------------------------------------------------------------------------- No results for input(s): DDIMER in the last 72 hours. -------------------------------------------------------------------------------------------------------------------  Cardiac Enzymes No results for input(s): CKMB, TROPONINI, MYOGLOBIN in the last 168 hours.  Invalid input(s): CK ------------------------------------------------------------------------------------------------------------------ Invalid input(s): POCBNP  ---------------------------------------------------------------------------------------------------------------  Urinalysis    Component Value  Date/Time   COLORURINE YELLOW* 09/05/2015 2312   COLORURINE YELLOW 07/21/2013 1038   APPEARANCEUR CLOUDY* 09/05/2015 2312   APPEARANCEUR HAZY 07/21/2013 1038   LABSPEC 1.031* 09/05/2015 2312   LABSPEC 1.010 07/21/2013 1038   PHURINE 7.0 09/05/2015 2312   PHURINE 8.0 07/21/2013 1038   GLUCOSEU >500* 09/05/2015 2312   GLUCOSEU NEGATIVE 07/21/2013 1038   HGBUR 1+* 09/05/2015 2312   HGBUR NEGATIVE 07/21/2013 1038   BILIRUBINUR NEGATIVE 09/05/2015 2312   BILIRUBINUR NEGATIVE 07/21/2013 1038   KETONESUR NEGATIVE 09/05/2015 2312   KETONESUR MODERATE 07/21/2013 1038   PROTEINUR NEGATIVE 09/05/2015 2312   PROTEINUR 30 mg/dL 11/03/2015 24/40/1027   UROBILINOGEN 0.2 12/27/2013 1612   NITRITE NEGATIVE 09/05/2015 2312   NITRITE NEGATIVE 07/21/2013 1038   LEUKOCYTESUR 2+* 09/05/2015 2312   LEUKOCYTESUR 2+ 07/21/2013 1038     RADIOLOGY: No results found.  EKG: Orders placed or performed during the hospital encounter of 09/05/15  . ED EKG  . ED EKG    IMPRESSION AND PLAN: 70 year old female patient with history of decubitus ulcer, degenerative joint disease, hypertension, GERD, rheumatoid arthritis presented to the emergency room with  lethargy and poor oral intake. Patient was dry and dehydrated blood sugar was high. No history of any diabetes mellitus. Admitting diagnosis 1. New onset diabetes mellitus 2. Dehydration 3. Hypokalemia 4. Adult failure to thrive 5. Decubitus ulcers Treatment plan It patient to medical floor IV fluid hydration with potassium supplementation Wound care consultation for decubitus ulcer care Start patient on oral metformin and sliding scale coverage with NovoLog insulin for diabetes Follow-up HbA1c level Diabetic teaching and education Supportive care Follow up CT head .  All the records are reviewed and case discussed with ED provider. Management plans discussed with the patient, family and they are in agreement.  CODE STATUS:FULL Code Status History     Date Active Date Inactive Code Status Order ID Comments User Context   07/26/2015  3:51 PM 08/01/2015  4:32 PM Full Code [id number]  Shaune Pollack, MD Inpatient   09/15/2014  6:36 PM 09/16/2014  5:22 PM Full Code [id number]  Dominica Severin, MD Inpatient   02/07/2014 11:04 AM 02/07/2014 10:39 PM Full Code [id number]  Hewitt Shorts, MD Inpatient   12/26/2012 11:54 PM 01/05/2013  3:34 PM Full Code [id number]  Ron Parker, MD ED       TOTAL TIME TAKING CARE OF THIS PATIENT: 50 minutes.    Ihor Austin M.D on 09/06/2015 at 2:41 AM  Between 7am to 6pm - Pager - [phone]  After 6pm go to [email or web address] - password [email or web address] [facility]  [place] [facility]  Office  [phone]  CC: Primary care physician; Lauro Regulus., MD

## 2015-09-06 NOTE — Clinical Social Work Note (Signed)
Clinical Social Work Assessment  Patient Details  Name: Michelle Cabrera MRN: [id number] Date of Birth: 04-Jan-1946  Date of referral:  09/06/15               Reason for consult:  Discharge Planning                Permission sought to share information with:  Family Supports Permission granted to share information::  Yes, Verbal Permission Granted  Name::        Agency::     Relationship::   Michelle Cabrera (Spouse) -[phone] & Michelle Cabrera (Sister) [phone])  Contact Information:     Housing/Transportation Living arrangements for the past 2 months:  [place] of Information:  Patient, Spouse Patient Interpreter Needed:  None Criminal Activity/Legal Involvement Pertinent to Current Situation/Hospitalization:  No - Comment as needed Significant Relationships:  Adult Children, Siblings, Spouse Michelle Cabrera (Spouse) -[phone] & Michelle Cabrera (Sister) [phone]) Lives with:  Spouse Do you feel safe going back to the place where you live?  Yes Need for family participation in patient care:  Yes (Comment) Michelle Cabrera (Spouse) [phone] & Michelle Cabrera (Sister) [phone])  Care giving concerns:  PT recommended SNF placement for patient.    Social Worker assessment / plan:  CSW was informed by PT that patient is recommended for SNF at discharge. CSW met with patient, Spouse Michelle Cabrera) and sister Michelle Cabrera) at bedside. Patient's sister Michelle Cabrera called patient's daughter over the phone to be involved with assessment. CSW explained her role. Per [place] patient lives at home with him. He reports that he' has difficulty with assisting patient with walking and using the bathroom. He reports "she took care of me all my life... Now it's my time to take care of her". He reports he wants patient to get back stronger. Per [facility] patient is "confused". He stated it has gotten worse over the past month. Patient and her family are interested in SNF placement. CSW provided  SNF list. Preference [facility]. Granted verbal permission to send SNF referal to [facility].   FL2 completed and faxed to SNFs in [facility]. PASRR is currently pending. Awaiting bed offers.    Employment status:  Retired Forensic scientist:  Medicare PT Recommendations:  [place] / Referral to community resources:  [place]  Patient/Family's Response to care:  Patient and her family are agreeable to [facility] in [place].   Patient/Family's Understanding of and Emotional Response to Diagnosis, Current Treatment, and Prognosis:  Patient and her family understands CSW's role and are appreciative of assistance.   Emotional Assessment Appearance:  Appears stated age Attitude/Demeanor/Rapport:   (None ) Affect (typically observed):  Calm, Pleasant Orientation:  Oriented to Self, Oriented to Place, Oriented to Situation Alcohol / Substance use:  Not Applicable Psych involvement (Current and /or in the community):  No (Comment)  Discharge Needs  Concerns to be addressed:  Discharge Planning Concerns Readmission within the last 30 days:  No Current discharge risk:  Chronically ill Barriers to Discharge:  Continued Medical Work up   [employer], LCSW 09/06/2015, 4:10 PM

## 2015-09-07 ENCOUNTER — Inpatient Hospital Stay: Payer: Medicare Other

## 2015-09-07 LAB — BASIC METABOLIC PANEL
ANION GAP: 6 (ref 5–15)
BUN: 9 mg/dL (ref 6–20)
CALCIUM: 7.6 mg/dL — AB (ref 8.9–10.3)
CHLORIDE: 109 mmol/L (ref 101–111)
CO2: 26 mmol/L (ref 22–32)
Creatinine, Ser: 0.38 mg/dL — ABNORMAL LOW (ref 0.44–1.00)
GFR calc Af Amer: >60 mL/min (ref >60)
GFR calc non Af Amer: >60 mL/min (ref >60)
GLUCOSE: 196 mg/dL — AB (ref 65–99)
Potassium: 4 mmol/L (ref 3.5–5.1)
Sodium: 141 mmol/L (ref 135–145)

## 2015-09-07 LAB — GLUCOSE, CAPILLARY
GLUCOSE-CAPILLARY: 189 mg/dL — AB (ref 65–99)
Glucose-Capillary: 191 mg/dL — ABNORMAL HIGH (ref 65–99)
Glucose-Capillary: 131 mg/dL — ABNORMAL HIGH (ref 65–99)

## 2015-09-07 LAB — MAGNESIUM: MAGNESIUM: 1.2 mg/dL — AB (ref 1.7–2.4)

## 2015-09-07 MED ORDER — INSULIN GLARGINE 100 UNIT/ML ~~LOC~~ SOLN
10.0000 [IU] | Freq: Every day | SUBCUTANEOUS | Status: DC
  Administered 2015-09-07 – 2015-09-08 (×2): 10 [IU] via SUBCUTANEOUS
  Filled 2015-09-07 (×3): qty 0.1

## 2015-09-07 MED ORDER — ENSURE ENLIVE PO LIQD: 237.0000 mL | Freq: Two times a day (BID) | ORAL | Status: DC

## 2015-09-07 MED ORDER — MAGNESIUM SULFATE 2 GM/50ML IV SOLN
2.0000 g | Freq: Once | INTRAVENOUS | Status: AC
  Administered 2015-09-07: 2 g via INTRAVENOUS
  Filled 2015-09-07: qty 50

## 2015-09-07 MED ORDER — GLUCERNA SHAKE PO LIQD
237.0000 mL | Freq: Three times a day (TID) | ORAL | Status: DC
  Administered 2015-09-08 (×2): 237 mL via ORAL

## 2015-09-07 MED ORDER — ZINC OXIDE 20 % EX OINT
TOPICAL_OINTMENT | Freq: Two times a day (BID) | CUTANEOUS | Status: DC | PRN
  Filled 2015-09-07: qty 28.35

## 2015-09-07 MED ORDER — SODIUM CHLORIDE 0.9 % IV SOLN
INTRAVENOUS | Status: DC
  Administered 2015-09-07: 22:00:00 via INTRAVENOUS

## 2015-09-07 MED ORDER — POTASSIUM CHLORIDE 20 MEQ PO PACK
20.0000 meq | PACK | Freq: Every day | ORAL | Status: DC
  Administered 2015-09-07 – 2015-09-08 (×2): 20 meq via ORAL
  Filled 2015-09-07: qty 1

## 2015-09-07 MED ORDER — NICOTINE 21 MG/24HR TD PT24
21.0000 mg | MEDICATED_PATCH | Freq: Every day | TRANSDERMAL | Status: DC
  Administered 2015-09-07 – 2015-09-08 (×2): 21 mg via TRANSDERMAL
  Filled 2015-09-07 (×2): qty 1

## 2015-09-07 MED ORDER — SODIUM CHLORIDE 0.9 % IV BOLUS (SEPSIS)
1000.0000 mL | Freq: Once | INTRAVENOUS | Status: AC
  Administered 2015-09-07: 1000 mL via INTRAVENOUS

## 2015-09-07 MED ORDER — GLUCERNA SHAKE PO LIQD: 237.0000 mL | Freq: Three times a day (TID) | ORAL | Status: DC

## 2015-09-07 NOTE — Progress Notes (Addendum)
CSW spoke to MD [facility] about completing a 30-day note for patient. MD signed 30-day note. CSW faxed the following information to [phone]: H&P, FL2 & 30 day note. CSW messaged [facility] through their websiteto inform them that information was faxed for patient. CSW is still awaiting PASRR through [facility].  CSW contacted Jomarie Longs- Admissions coordinator at [facility]. Per Jomarie Longs he is able to offer patient a bed offer. CSW presented bed offers to patient and her family. Per patient, husband Deniece Portela) and patient's sister Roderic Scarce). Accepted bed at [facility]. CSW informed Jomarie Longs of accepted bed offer. CSW will continue to follow and assist.   Woodroe Mode, MSW, LCSW-A Clinical Social Work Department [phone]

## 2015-09-07 NOTE — Progress Notes (Signed)
CSW checked the status of patient's PASRR. PASRR is requesting the following information: H&P, FL2 & 30 day note from MD. CSW placed a 30 day note on patient's chart and requested MD sign the note. CSW will continue to follow and assist.   Woodroe Mode, MSW, LCSW-A Clinical Social Work Department [phone]

## 2015-09-07 NOTE — Progress Notes (Signed)
Pts HR elevated from normal. Pt consistently has HR in the 130's. Dr Sheryle Hail made aware. No new orders at this time.

## 2015-09-07 NOTE — Progress Notes (Signed)
CSW received PASRR Number ([id number]) from [facility]. Updated FL2 with this information. CSW will continue to follow and assist.   Woodroe Mode, MSW, LCSW-A Clinical Social Work Department [phone]

## 2015-09-07 NOTE — Progress Notes (Addendum)
rn spoke with dr Renae Gloss re: low bp,tachycardia and blisters to buttocks. md to order bolus and ivf and zinc oxide ointment. Pt refuses to eat. md orders glucerna

## 2015-09-07 NOTE — Progress Notes (Signed)
Physical Therapy Treatment Patient Details Name: Michelle Cabrera MRN: [id number] DOB: 07/28/1946 Today's Date: 09/07/2015    History of Present Illness Michelle Cabrera is a 70 y.o. female with a known history of hypertension, COPD, GERD, rheumatoid arthritis, decubitus ulcer, degenerative disc disease presented to the emergency room with weakness and confusion. Patient was accompanied by family members. Patient lives with her husband at home and patient's son and daughter live close by. According to the family members patient has been lethargic and confused for the last couple of days. She has been drinking a lot of fluids and feels thirsty throughout the day. She also urinates a lot and frequently. Has poor oral intake. Patient has a decubitus ulcers and mostly bed bound. Does not ambulate much at home. Patient was oriented to self and place in the emergency room but not completely oriented to time. Blood sugars were high in the emergency room and no history of any diabetes mellitus. Has multiple abrasions over the lower extremities. Patient had a fall 2 weeks ago and had a shoulder fracture for which she saw orthopedic physician who recommended conservative management. No history of any fever or chills. Pt is improved in orientation at time of PT evaluation compared to yesterday. Pt reports only 1 fall in the last 12 months. Pt reports that she was ambulating with rolling walker at home in December 2016. She has had a gradual decline in function with dramatic decline since she fell and fractured R humerus. Family reports that over the last 2 weeks her husband has been lifting and carrying her for transfers.    PT Comments    Consulted with RN prior to session. RN reports bilateral heel pain and requests that keep heels floated today and avoid ambulation to prevent skin breakdown during therapy. Pt able to complete all bed exercises as instructed with some confusion noted. Will continue to progress  mobility and transfers as well as attempted ambulation in further sessions. Pt will benefit from skilled PT services to address deficits in strength, balance, and mobility in order to return to full function at home.   Follow Up Recommendations  SNF     Equipment Recommendations  Other (comment) (Pt may need hemiwalker. TBD at [facility])    Recommendations for Other Services Rehab consult     Precautions / Restrictions Precautions Precautions: Fall Restrictions Weight Bearing Restrictions: Yes RUE Weight Bearing: Non weight bearing    Mobility  Bed Mobility               General bed mobility comments: Deferred at this time  Transfers                 General transfer comment: Deferred at this time  Ambulation/Gait             General Gait Details: Unable to attempt/perform   Stairs            Wheelchair Mobility    Modified Rankin (Stroke Patients Only)       Balance                                    Cognition Arousal/Alertness: Awake/alert Behavior During Therapy: WFL for tasks assessed/performed Overall Cognitive Status: Impaired/Different from baseline (AOx1, partial orientation to place and time)       Memory: Decreased short-term memory  Exercises Low Level/ICU Exercises Ankle Circles/Pumps: Strengthening;Both;10 reps;Supine Quad Sets: Strengthening;Both;10 reps;Supine Short Arc Quad: Strengthening;Both;10 reps;Supine Hip ABduction/ADduction: Strengthening;Both;10 reps;Supine Heel Slides: Strengthening;Both;10 reps;Supine Elbow Flexion: Strengthening;Left;10 reps;Supine (Shoulder extension x 10 supine)    General Comments        Pertinent Vitals/Pain Pain Assessment: 0-10 Pain Score: 0-No pain Pain Location: Pt denies R shoulder pain at rest currently. Did not perform bed mobility, transfers, or ambulation. Repositioned R shoulder with increased support Pain Intervention(s): Monitored during  session    Home Living                      Prior Function            PT Goals (current goals can now be found in the care plan section) Acute Rehab PT Goals Patient Stated Goal: Pt would like to return to prior level of function and be able to assist with transfers PT Goal Formulation: With patient/family Time For Goal Achievement: 09/20/15 Potential to Achieve Goals: Fair Progress towards PT goals: Progressing toward goals    Frequency  Min 2X/week    PT Plan Current plan remains appropriate    Co-evaluation             End of Session Equipment Utilized During Treatment: Oxygen Activity Tolerance: Patient limited by pain;Treatment limited secondary to medical complications (Comment) (Bilateral LE heel pain) Patient left: in bed;with call bell/phone within reach;with bed alarm set;with family/visitor present     Time: 6384-6659 PT Time Calculation (min) (ACUTE ONLY): 15 min  Charges:  $Therapeutic Exercise: 8-22 mins                    G Codes:      Sharalyn Ink Huprich PT, DPT   Huprich,Jason 09/07/2015, 5:03 PM

## 2015-09-07 NOTE — Clinical Social Work Placement (Signed)
   CLINICAL SOCIAL WORK PLACEMENT  NOTE  Date:  09/07/2015  Patient Details  Name: Michelle Cabrera MRN: [id number] Date of Birth: 01-30-1946  Clinical Social Work is seeking post-discharge placement for this patient at the Skilled  Nursing Facility level of care (*CSW will initial, date and re-position this form in  chart as items are completed):  Yes   Patient/family provided with [facility] Clinical Social Work Department's list of facilities offering this level of care within the geographic area requested by the patient (or if unable, by the patient's family).  Yes   Patient/family informed of their freedom to choose among providers that offer the needed level of care, that participate in Medicare, Medicaid or managed care program needed by the patient, have an available bed and are willing to accept the patient.  Yes   Patient/family informed of [facility]'s ownership interest in [facility] and [facility], as well as of the fact that they are under no obligation to receive care at these facilities.  PASRR submitted to EDS on 09/06/15     PASRR number received on 09/07/15     Existing PASRR number confirmed on       FL2 transmitted to all facilities in geographic area requested by pt/family on 09/06/15     FL2 transmitted to all facilities within larger geographic area on       Patient informed that his/her managed care company has contracts with or will negotiate with certain facilities, including the following:            Patient/family informed of bed offers received.  Patient chooses bed at       Physician recommends and patient chooses bed at      Patient to be transferred to   on  .  Patient to be transferred to facility by       Patient family notified on   of transfer.  Name of family member notified:        PHYSICIAN       Additional Comment:    _______________________________________________ Idamae Lusher, LCSW 09/07/2015, 3:35 PM

## 2015-09-07 NOTE — Clinical Documentation Improvement (Signed)
Internal Medicine  Can the diagnosis of encephalopathy be further specified in progress notes and discharge summary?   Metabolic encephalopathy  Other  Clinically Undetermined  Document any associated diagnoses/conditions.   Supporting Information:  Chief complaint was Altered Mental Status  Found to have newly diagnosed Diabetes  2/8 progress note: Acute encephalopathy with hallucinations visual, likely secondary to acute cystitis with hematuria. This was present on admission. Add on a urine culture and start Rocephin. When necessary Haldol for hallucinations and Seroquel at night. I will also get a psychiatric consultation   Please exercise your independent, professional judgment when responding. A specific answer is not anticipated or expected.   Thank You,  Harless Litten [facility] [phone]

## 2015-09-07 NOTE — Progress Notes (Signed)
Pts family request a nicotine patch for the patient. MD notified. Order received. Will continue to assess.

## 2015-09-07 NOTE — Progress Notes (Signed)
Inpatient Diabetes Program Recommendations  AACE/ADA: New Consensus Statement on Inpatient Glycemic Control (2015)  Target Ranges:  Prepandial:   less than 140 mg/dL      Peak postprandial:   less than 180 mg/dL (1-2 hours)      Critically ill patients:  140 - 180 mg/dL   Review of Glycemic Control  Results for MAISIE, HAUSER (MRN [id number]) as of 09/07/2015 08:14  Ref. Range 09/06/2015 07:31 09/06/2015 11:39 09/06/2015 16:41 09/06/2015 20:13 09/07/2015 07:43  Glucose-Capillary Latest Ref Range: 65-99 mg/dL 921 (H) 194 (H) 174 (H) 115 (H) 191 (H)    Diabetes history: New onset Type 2 Outpatient Diabetes medications: None noted Current orders for Inpatient glycemic control: Novolog moderate tid with meals and HS, Metformin 500mg  bid  Inpatient Diabetes Program Recommendations:   Fasting blood sugar 191mg /dl  Please consider adding Levemir 10 units daily to cover basal insulin needs (0.15units/kg).- A1C 11.5%.        , RN, BA, MHA, CDE Diabetes Coordinator Inpatient Diabetes Program  [phone] (Team Pager) [phone] [facility] Office) 09/07/2015 7:28 AM

## 2015-09-07 NOTE — Progress Notes (Signed)
Patient ID: Michelle Cabrera, female   DOB: 04-15-1946, 70 y.o.   MRN: [id number] [facility] PROGRESS NOTE  DHARMA PARE LOV:[id number] DOB: 1945-11-14 DOA: 09/05/2015 PCP: Lauro Regulus., MD  HPI/Subjective: Patient answers some yes or now questions. With some lethargy this am.  Patient states that she is not in pain.  Objective: Filed Vitals:   09/06/15 2019 09/07/15 0647  BP: 88/59 124/73  Pulse: 111 131  Temp: 98.2 F (36.8 C) 98.6 F (37 C)  Resp: 18 20    Filed Weights   09/05/15 2120 09/06/15 0433  Weight: 63.504 kg (140 lb) 65.499 kg (144 lb 6.4 oz)    ROS: Review of Systems  Unable to perform ROS  limited with lethargy Exam: Physical Exam  Constitutional: She appears lethargic.  HENT:  Nose: No mucosal edema.  Mouth/Throat: No oropharyngeal exudate or posterior oropharyngeal edema.  Eyes: Conjunctivae, EOM and lids are normal. Pupils are equal, round, and reactive to light.  Neck: No JVD present. Carotid bruit is not present. No edema present. No thyroid mass and no thyromegaly present.  Cardiovascular: S1 normal and S2 normal.  Exam reveals no gallop.   No murmur heard. Pulses:      Dorsalis pedis pulses are 2+ on the right side, and 2+ on the left side.  Respiratory: No respiratory distress. She has no wheezes. She has no rhonchi. She has no rales.  GI: Soft. Bowel sounds are normal. There is no tenderness.  Musculoskeletal:       Right ankle: She exhibits no swelling.       Left ankle: She exhibits no swelling.  Lymphadenopathy:    She has no cervical adenopathy.  Neurological: She appears lethargic.  Follow some simple commands able to straight leg raise.  Skin: Skin is warm. Nails show no clubbing.  As per nursing staff decubitus ulcer on buttock. Lower extremities with bruises and scabs.  Psychiatric: She has a normal mood and affect.      Data Reviewed: Basic Metabolic Panel:  Recent Labs Lab 09/05/15 2131  09/06/15 0555 09/07/15 0540  NA 136 137 141  K 2.4* 3.2* 4.0  CL 96* 105 109  CO2 31 26 26   GLUCOSE 382* 244* 196*  BUN 10 9 9   CREATININE 0.49 0.40*  0.37* 0.38*  CALCIUM 7.6* 6.7* 7.6*  MG  --   --  1.2*   Liver Function Tests:  Recent Labs Lab 09/05/15 2131  AST 24  ALT 20  ALKPHOS 401*  BILITOT 0.7  PROT 5.8*  ALBUMIN 2.0*   CBC:  Recent Labs Lab 09/05/15 2131 09/06/15 0555  WBC 9.1 8.8  HGB 11.6* 10.0*  HCT 34.9* 30.1*  MCV 89.7 90.1  PLT 382 350    CBG:  Recent Labs Lab 09/06/15 0731 09/06/15 1139 09/06/15 1641 09/06/15 2013 09/07/15 0743  GLUCAP 226* 216* 163* 115* 191*    Studies: Ct Head Wo Contrast  09/06/2015  CLINICAL DATA:  70 year old female with confusion. EXAM: CT HEAD WITHOUT CONTRAST TECHNIQUE: Contiguous axial images were obtained from the base of the skull through the vertex without intravenous contrast. COMPARISON:  Head CT dated 07/29/2015 FINDINGS: There is stable diffuse global atrophy as well as moderate chronic microvascular ischemic disease. There is no intracranial hemorrhage. No mass effect or midline shift identified. The visualized paranasal sinuses and mastoid air cells are well aerated. The calvarium is intact. IMPRESSION: No acute intracranial hemorrhage. 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 If symptoms persist  and there are no contraindications, MRI may provide better evaluation if clinically indicated. Electronically Signed   By: Elgie Collard M.D.   On: 09/06/2015 03:28    Scheduled Meds: . aspirin EC  81 mg Oral Daily  . calcium-vitamin D  2 tablet Oral BID  . cefTRIAXone (ROCEPHIN)  IV  1 g Intravenous Q24H  . docusate sodium  100 mg Oral BID  . DULoxetine  30 mg Oral QHS  . DULoxetine  60 mg Oral q morning - 10a  . enoxaparin (LOVENOX) injection  40 mg Subcutaneous Q24H  . insulin aspart  0-15 Units Subcutaneous TID WC  . insulin aspart  0-5 Units Subcutaneous QHS  . insulin  glargine  10 Units Subcutaneous Daily  . magnesium sulfate 1 - 4 g bolus IVPB  2 g Intravenous Once  . mometasone-formoterol  2 puff Inhalation BID  . pantoprazole  40 mg Oral Daily  . potassium chloride  40 mEq Oral BID  . QUEtiapine  50 mg Oral QHS  . sodium chloride flush  3 mL Intravenous Q12H  . vitamin C  500 mg Oral Daily    Assessment/Plan:  1. Acute metabolic encephalopathy with hallucinations visual, likely secondary to acute cystitis with hematuria. This was present on admission. Added on a urine culture and started Rocephin. When necessary Haldol for hallucinations and Seroquel at night. Appreciate psychiatric consultation. Patient is a little lethargic this morning likely from medications. 2. New-onset diabetes with hemoglobin A1c greater than 11. DC Glucophage and start low-dose Lantus and continue sliding scale. 3. Chronic pain with rheumatoid arthritis and recent shoulder fracture. When necessary pain medication. Patient was supposed to have a follow-up appointment with Dr. Martha Clan. I will put in a consultation. 4. Anxiety and depression continue current psychiatric medications with labile mood. Appreciate psychiatric patient 5. Gastroesophageal reflux disease without esophagitis on Protonix 6. 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 Decubitus ulcer local wound care. 8. Weakness will likely need rehabilitation will get social worker involved 9. Rheumatoid arthritis- old methotrexate and Enbrel with infection 10. Hypokalemia- replaced 11. Hypomagnesemia replace IV  Code Status:     Code Status Orders        Start     Ordered   09/06/15 0405  Full code   Continuous     09/06/15 0404    Code Status History    Date Active Date Inactive Code Status Order ID Comments User Context   07/26/2015  3:51 PM 08/01/2015  4:32 PM Full Code [id number]  Shaune Pollack, MD Inpatient   09/15/2014  6:36 PM 09/16/2014  5:22 PM Full Code [id number]  Dominica Severin, MD Inpatient    02/07/2014 11:04 AM 02/07/2014 10:39 PM Full Code [id number]  Hewitt Shorts, MD Inpatient   12/26/2012 11:54 PM 01/05/2013  3:34 PM Full Code [id number]  Ron Parker, MD ED     Family Communication: Spoke with husband on phone Disposition Plan: Likely will end up needing rehabilitation  Consultants:  Psychiatry  Physical therapy  Antibiotics:  Rocephin  Time spent: 20 minutes  Alford Highland  [facility]

## 2015-09-08 DIAGNOSIS — L8990 Pressure ulcer of unspecified site, unspecified stage: Secondary | ICD-10-CM | POA: Insufficient documentation

## 2015-09-08 LAB — MAGNESIUM: Magnesium: 1.5 mg/dL — ABNORMAL LOW (ref 1.7–2.4)

## 2015-09-08 LAB — GLUCOSE, CAPILLARY
GLUCOSE-CAPILLARY: 160 mg/dL — AB (ref 65–99)
Glucose-Capillary: 169 mg/dL — ABNORMAL HIGH (ref 65–99)

## 2015-09-08 LAB — BASIC METABOLIC PANEL
ANION GAP: 8 (ref 5–15)
BUN: 9 mg/dL (ref 6–20)
CO2: 23 mmol/L (ref 22–32)
Calcium: 7.6 mg/dL — ABNORMAL LOW (ref 8.9–10.3)
Chloride: 107 mmol/L (ref 101–111)
Creatinine, Ser: 0.48 mg/dL (ref 0.44–1.00)
GFR calc Af Amer: >60 mL/min (ref >60)
GLUCOSE: 169 mg/dL — AB (ref 65–99)
POTASSIUM: 3.4 mmol/L — AB (ref 3.5–5.1)
Sodium: 138 mmol/L (ref 135–145)

## 2015-09-08 MED ORDER — LIVING WELL WITH DIABETES BOOK
Freq: Once | Status: AC
  Administered 2015-09-08: 16:00:00
  Filled 2015-09-08 (×2): qty 1

## 2015-09-08 MED ORDER — QUETIAPINE FUMARATE 100 MG PO TABS: 100.0000 mg | ORAL_TABLET | Freq: Every day | ORAL | Status: DC

## 2015-09-08 MED ORDER — INSULIN ASPART 100 UNIT/ML ~~LOC~~ SOLN: 0.0000 [IU] | Freq: Every day | SUBCUTANEOUS | Status: DC

## 2015-09-08 MED ORDER — INSULIN GLARGINE 100 UNIT/ML ~~LOC~~ SOLN: 10.0000 [IU] | Freq: Every day | SUBCUTANEOUS | Status: DC

## 2015-09-08 MED ORDER — CIPROFLOXACIN HCL 250 MG PO TABS: 250.0000 mg | ORAL_TABLET | Freq: Two times a day (BID) | ORAL | Status: DC

## 2015-09-08 MED ORDER — OXYCODONE-ACETAMINOPHEN 10-325 MG PO TABS: 1.0000 | ORAL_TABLET | Freq: Four times a day (QID) | ORAL | Status: DC | PRN

## 2015-09-08 MED ORDER — INSULIN ASPART 100 UNIT/ML ~~LOC~~ SOLN: 0.0000 [IU] | Freq: Three times a day (TID) | SUBCUTANEOUS | Status: DC

## 2015-09-08 NOTE — Consult Note (Signed)
  Psychiatry: Came to round on patient today but she was away from her room. Will try to follow up tomorrow.

## 2015-09-08 NOTE — Progress Notes (Signed)
Pt discharged to rehab [facility]

## 2015-09-08 NOTE — Progress Notes (Signed)
Called pharmacy about sending Living Will with Diabetes Book , they will send

## 2015-09-08 NOTE — Consult Note (Signed)
[facility] Consult   Reason for Consult:  Follow-up 70 year old woman with multiple medical problems. Seen for delirium. Referring Physician:  Verdell Carmine Patient Identification: Michelle Cabrera MRN:  [id number] Principal Diagnosis: New onset type 2 diabetes mellitus ([place]) Diagnosis:   Patient Active Problem List   Diagnosis Date Noted  . Pressure ulcer [L89.90] 09/08/2015  . New onset type 2 diabetes mellitus ([place]) [E11.9] 09/06/2015  . Altered mental status [R41.82] 09/06/2015  . COPD exacerbation ([place]) [J44.1] 07/26/2015  . Arthritis associated with another disorder [M19.90] 09/15/2014  . Wedge compression fracture of T6 vertebra ([place]) [S22.050A] 02/07/2014  . Intractable low back pain [M54.5] 12/27/2012  . DDD (degenerative disc disease) [IMO0002] 12/27/2012  . Rheumatoid arthritis ([place]) [M06.9] 12/27/2012  . Hypertension [I10] 12/27/2012    Total Time spent with patient: 30 minutes  Subjective:   Michelle Cabrera is a 70 y.o. female patient admitted with "I guess I'm okay".  HPI:  See previous intake note for full history. 70 year old woman with multiple medical problems all pretty severe area and when I saw HER-2 days ago she was quite delirious. On reevaluation today I got to talk with the patient and her daughter. Also spoke with the hospitalist on duty and review the chart. Patient tells me that she thinks she is feeling better. She is still vague about what brought her in but at least she knows that she had a fall and got injured. She is oriented to the name of the hospital although she has trouble actually explaining that it is a hospital. She knows the year but not the month. She is much more attentive and appropriate although still clearly still having some confusion. I noticed that over the last couple days the only when necessary doses of Haldol that were given were in the middle of the night suggesting she is still getting agitated and having trouble sleeping.  No other new complaints denies any mood symptoms. Expresses optimism about going home.  Past Psychiatric History: Some history of depression and anxiety in no hospitalization no suicide attempt  Risk to Self: Is patient at risk for suicide?: No Risk to Others:   Prior Inpatient Therapy:   Prior Outpatient Therapy:    Past Medical History:  Past Medical History  Diagnosis Date  . Hypertension   . Closed compression fracture of L1 lumbar vertebra ([place]) 11/2012  . COPD (chronic obstructive pulmonary disease) ([place])   . Anxiety   . Depression   . GERD (gastroesophageal reflux disease)   . Rheumatoid arthritis ([place])   . DDD (degenerative disc disease)   . Chronic lower back pain   . Collagen vascular disease [facility])     Past Surgical History  Procedure Laterality Date  . Lumbar disc surgery       X 3  . Kyphoplasty N/A 01/04/2013    Procedure: L1 Kyphoplasty;  Surgeon: Hosie Spangle, MD;  Location: [place] [facility];  Service: Neurosurgery;  Laterality: N/A;  L1 Kyphoplasty  . Tubal ligation    . Foot surgery    . Wrist fusion Right     30 yrs ago  . Kyphoplasty N/A 02/07/2014    Procedure: THORACIC SIX KYPHOPLASTY;  Surgeon: Hosie Spangle, MD;  Location: [place] [facility];  Service: Neurosurgery;  Laterality: N/A;  T6 Kyphoplasty   . Hand tendon surgery Right 09/15/2014    "d/t RA"  . Tonsillectomy    . Posterior lumbar fusion      "got screws in"  .  Back surgery    . Cataract extraction w/ intraocular lens  implant, bilateral Bilateral   . Finger arthroplasty Right 09/15/2014    Procedure: RIGHT MIDDLE FINGER, RING FINGER, SMALL FINGER EXTENSOR DIGITORUM COMMUMIS STABILIZATION WITH RIGHT MIDDLE FINGER MCP ARTHROPLASTY, POSSIBLE RING FINGER AND SMALL FINGER MCP ARTHROPLASTY;  Surgeon: Roseanne Kaufman, MD;  Location: [place];  Service: Orthopedics;  Laterality: Right;  . Breast biopsy Left     negative   Family History:  Family History  Problem Relation Age of Onset  . CAD Father   .  CAD Mother   . Hypertension Mother   . Peripheral vascular disease Mother   . Breast cancer Maternal Aunt 50   Family Psychiatric  History: Nonidentified Social History:  History  Alcohol Use No     History  Drug Use No    Social History   Social History  . Marital Status: Married    Spouse Name: N/A  . Number of Children: N/A  . Years of Education: N/A   Occupational History  . retired    Social History Main Topics  . Smoking status: Former Smoker -- 1.00 packs/day for 53 years    Types: Cigarettes  . Smokeless tobacco: Never Used  . Alcohol Use: No  . Drug Use: No  . Sexual Activity: Yes   Other Topics Concern  . None   Social History Narrative   Lives with husband at home   Additional Social History:    Allergies:   Allergies  Allergen Reactions  . Gold-Containing Drug Products Anaphylaxis  . Adhesive [Tape]     Tears skin  . Dilaudid [Hydromorphone Hcl]     Made tongue swell  . Pravastatin     Arms and legs hurt     Labs:  Results for orders placed or performed during the hospital encounter of 09/05/15 (from the past 48 hour(s))  Glucose, capillary     Status: Abnormal   Collection Time: 09/06/15  4:41 PM  Result Value Ref Range   Glucose-Capillary 163 (H) 65 - 99 mg/dL   Comment 1 Notify RN   Glucose, capillary     Status: Abnormal   Collection Time: 09/06/15  8:13 PM  Result Value Ref Range   Glucose-Capillary 115 (H) 65 - 99 mg/dL  Magnesium     Status: Abnormal   Collection Time: 09/07/15  5:40 AM  Result Value Ref Range   Magnesium 1.2 (L) 1.7 - 2.4 mg/dL  Basic metabolic panel     Status: Abnormal   Collection Time: 09/07/15  5:40 AM  Result Value Ref Range   Sodium 141 135 - 145 mmol/L   Potassium 4.0 3.5 - 5.1 mmol/L   Chloride 109 101 - 111 mmol/L   CO2 26 22 - 32 mmol/L   Glucose, Bld 196 (H) 65 - 99 mg/dL   BUN 9 6 - 20 mg/dL   Creatinine, Ser 0.38 (L) 0.44 - 1.00 mg/dL   Calcium 7.6 (L) 8.9 - 10.3 mg/dL   GFR calc non Af  Amer >60 >60 mL/min   GFR calc Af Amer >60 >60 mL/min    Comment: (NOTE) The eGFR has been calculated using the CKD EPI equation. This calculation has not been validated in all clinical situations. eGFR's persistently <60 mL/min signify possible Chronic Kidney Disease.    Anion gap 6 5 - 15  Glucose, capillary     Status: Abnormal   Collection Time: 09/07/15  7:43 AM  Result Value Ref Range  Glucose-Capillary 191 (H) 65 - 99 mg/dL   Comment 1 Notify RN   Glucose, capillary     Status: Abnormal   Collection Time: 09/07/15 11:25 AM  Result Value Ref Range   Glucose-Capillary 189 (H) 65 - 99 mg/dL   Comment 1 Notify RN   Glucose, capillary     Status: Abnormal   Collection Time: 09/07/15  4:26 PM  Result Value Ref Range   Glucose-Capillary 131 (H) 65 - 99 mg/dL  Basic metabolic panel     Status: Abnormal   Collection Time: 09/08/15  5:07 AM  Result Value Ref Range   Sodium 138 135 - 145 mmol/L   Potassium 3.4 (L) 3.5 - 5.1 mmol/L   Chloride 107 101 - 111 mmol/L   CO2 23 22 - 32 mmol/L   Glucose, Bld 169 (H) 65 - 99 mg/dL   BUN 9 6 - 20 mg/dL   Creatinine, Ser 0.48 0.44 - 1.00 mg/dL   Calcium 7.6 (L) 8.9 - 10.3 mg/dL   GFR calc non Af Amer >60 >60 mL/min   GFR calc Af Amer >60 >60 mL/min    Comment: (NOTE) The eGFR has been calculated using the CKD EPI equation. This calculation has not been validated in all clinical situations. eGFR's persistently <60 mL/min signify possible Chronic Kidney Disease.    Anion gap 8 5 - 15  Magnesium     Status: Abnormal   Collection Time: 09/08/15  5:07 AM  Result Value Ref Range   Magnesium 1.5 (L) 1.7 - 2.4 mg/dL  Glucose, capillary     Status: Abnormal   Collection Time: 09/08/15  8:59 AM  Result Value Ref Range   Glucose-Capillary 169 (H) 65 - 99 mg/dL   Comment 1 Notify RN   Glucose, capillary     Status: Abnormal   Collection Time: 09/08/15 12:01 PM  Result Value Ref Range   Glucose-Capillary 160 (H) 65 - 99 mg/dL   Comment 1  Notify RN     Current Facility-Administered Medications  Medication Dose Route Frequency Provider Last Rate Last Dose  . 0.9 %  sodium chloride infusion   Intravenous Continuous Loletha Grayer, MD 50 mL/hr at 09/08/15 [phone]    . acetaminophen (TYLENOL) tablet 650 mg  650 mg Oral Q6H PRN Saundra Shelling, MD       Or  . acetaminophen (TYLENOL) suppository 650 mg  650 mg Rectal Q6H PRN Saundra Shelling, MD      . aspirin EC tablet 81 mg  81 mg Oral Daily Saundra Shelling, MD   81 mg at 09/08/15 0915  . calcium-vitamin D (OSCAL WITH D) 500-200 MG-UNIT per tablet 2 tablet  2 tablet Oral BID Saundra Shelling, MD   2 tablet at 09/08/15 0915  . cefTRIAXone (ROCEPHIN) 1 g in dextrose 5 % 50 mL IVPB  1 g Intravenous Q24H Loletha Grayer, MD   1 g at 09/08/15 1218  . docusate sodium (COLACE) capsule 100 mg  100 mg Oral BID Saundra Shelling, MD   100 mg at 09/08/15 0915  . DULoxetine (CYMBALTA) DR capsule 30 mg  30 mg Oral QHS Saundra Shelling, MD   30 mg at 09/07/15 2208  . DULoxetine (CYMBALTA) DR capsule 60 mg  60 mg Oral q morning - 10a Saundra Shelling, MD   60 mg at 09/08/15 0915  . enoxaparin (LOVENOX) injection 40 mg  40 mg Subcutaneous Q24H Saundra Shelling, MD   40 mg at 09/07/15 2158  . feeding supplement (GLUCERNA  SHAKE) (GLUCERNA SHAKE) liquid 237 mL  237 mL Oral TID WC Loletha Grayer, MD   237 mL at 09/08/15 1220  . gabapentin (NEURONTIN) capsule 600 mg  600 mg Oral TID PRN Saundra Shelling, MD      . haloperidol (HALDOL) tablet 1 mg  1 mg Oral Q6H PRN Loletha Grayer, MD      . haloperidol lactate (HALDOL) injection 1 mg  1 mg Intravenous Q4H PRN Gonzella Lex, MD   1 mg at 09/08/15 0236  . insulin aspart (novoLOG) injection 0-15 Units  0-15 Units Subcutaneous TID WC Saundra Shelling, MD   3 Units at 09/08/15 1218  . insulin aspart (novoLOG) injection 0-5 Units  0-5 Units Subcutaneous QHS Saundra Shelling, MD   2 Units at 09/07/15 2158  . insulin glargine (LANTUS) injection 10 Units  10 Units Subcutaneous Daily Loletha Grayer, MD   10 Units at 09/08/15 1222  . ipratropium-albuterol (DUONEB) 0.5-2.5 (3) MG/3ML nebulizer solution 3 mL  3 mL Nebulization Q6H PRN Saundra Shelling, MD      . living well with diabetes book MISC   Does not apply Once Henreitta Leber, MD      . mometasone-formoterol [facility]) 100-5 MCG/ACT inhaler 2 puff  2 puff Inhalation BID Saundra Shelling, MD   2 puff at 09/07/15 2208  . nicotine (NICODERM CQ - dosed in mg/24 hours) patch 21 mg  21 mg Transdermal Daily Loletha Grayer, MD   21 mg at 09/08/15 0917  . ondansetron (ZOFRAN) tablet 4 mg  4 mg Oral Q6H PRN Saundra Shelling, MD   4 mg at 09/06/15 1717   Or  . ondansetron (ZOFRAN) injection 4 mg  4 mg Intravenous Q6H PRN Saundra Shelling, MD      . oxyCODONE-acetaminophen (PERCOCET/ROXICET) 5-325 MG per tablet 1 tablet  1 tablet Oral Q6H PRN Loletha Grayer, MD   1 tablet at 09/08/15 5427   And  . oxyCODONE (Oxy IR/ROXICODONE) immediate release tablet 5 mg  5 mg Oral Q6H PRN Loletha Grayer, MD   5 mg at 09/08/15 1225  . pantoprazole (PROTONIX) EC tablet 40 mg  40 mg Oral Daily Saundra Shelling, MD   40 mg at 09/08/15 0915  . potassium chloride (KLOR-CON) packet 20 mEq  20 mEq Oral Daily Loletha Grayer, MD   20 mEq at 09/08/15 0916  . QUEtiapine (SEROQUEL) tablet 100 mg  100 mg Oral QHS John T Clapacs, MD      . sodium chloride flush (NS) 0.9 % injection 3 mL  3 mL Intravenous Q12H Saundra Shelling, MD   3 mL at 09/06/15 0932  . vitamin C (ASCORBIC ACID) tablet 500 mg  500 mg Oral Daily Pavan Pyreddy, MD   500 mg at 09/08/15 0915  . zinc oxide 20 % ointment   Topical BID PRN Loletha Grayer, MD        Musculoskeletal: Strength & Muscle Tone: decreased Gait & Station: unable to stand Patient leans: N/A  Psychiatric Specialty Exam: Review of Systems  HENT: Negative.   Eyes: Negative.   Respiratory: Negative.   Cardiovascular: Negative.   Gastrointestinal: Negative.   Musculoskeletal: Positive for back pain and joint pain.  Skin: Negative.    Neurological: Positive for weakness.  Psychiatric/Behavioral: Positive for memory loss. Negative for depression, suicidal ideas, hallucinations and substance abuse. The patient is nervous/anxious and has insomnia.     Blood pressure 114/80, pulse 129, temperature 98.6 F (37 C), temperature source Oral, resp. rate 18, height 5'  2" (1.575 m), weight 65.499 kg (144 lb 6.4 oz), SpO2 99 %.Body mass index is 26.4 kg/(m^2).  General Appearance: Casual  Eye Contact::  Fair  Speech:  Slow  Volume:  Decreased  Mood:  Euthymic  Affect:  Constricted  Thought Process:  Linear  Orientation:  Negative  Thought Content:  Negative  Suicidal Thoughts:  No  Homicidal Thoughts:  No  Memory:  Immediate;   Good Recent;   Poor Remote;   Fair  Judgement:  Fair  Insight:  Fair  Psychomotor Activity:  Decreased  Concentration:  Fair  Recall:  [employer] of Knowledge:Fair  Language: Fair  Akathisia:  No  Handed:  Right  AIMS (if indicated):     Assets:  Desire for Improvement Financial Resources/Insurance Housing Resilience  ADL's:  Impaired  Cognition: Impaired,  Mild  Sleep:      Treatment Plan Summary: Medication management and Plan After reviewing the patient's medication records I suggest that we increase the evening dose of Seroquel from 50 mg to 100 mg. I would hope that this would let her sleep more soundly through the night and at the same time perhaps she will not need any more antipsychotics during the day. I would anticipate that with time at rehabilitation she should gradually recover from her delirium and back to her baseline mental state. No need to add any other new psychiatric medicine. Reviewed the plan with the hospitalist and with the patient and family. Orders done. No other change to treatment right now.  Disposition: Patient does not meet criteria for psychiatric inpatient admission. Supportive therapy provided about ongoing stressors.  Alethia Berthold, MD 09/08/2015 3:31  PM

## 2015-09-08 NOTE — Consult Note (Signed)
ORTHOPAEDIC CONSULTATION  REQUESTING PHYSICIAN: Houston Siren, MD  Chief Complaint: Right shoulder and left wrist pain  HPI: Michelle Cabrera is a 70 y.o. female who I have seen previously in the office. She sustained a fall 3 weeks ago. She has a diagnosed right proximal humerus fracture which is mildly displaced and does not require surgical intervention. She has been treated with immobilization. I ordered new films during her hospitalization. They show no further displacement of her right proximal humerus fracture. Patient was complaining of left wrist pain. This likely occurred during her fall 3 weeks ago. Patient has suffered some confusion recently and did not complain of left wrist pain in the office.  Past Medical History  Diagnosis Date  . Hypertension   . Closed compression fracture of L1 lumbar vertebra ([facility]) 11/2012  . COPD (chronic obstructive pulmonary disease) ([facility])   . Anxiety   . Depression   . GERD (gastroesophageal reflux disease)   . Rheumatoid arthritis ([facility])   . DDD (degenerative disc disease)   . Chronic lower back pain   . Collagen vascular disease [facility])    Past Surgical History  Procedure Laterality Date  . Lumbar disc surgery       X 3  . Kyphoplasty N/A 01/04/2013    Procedure: L1 Kyphoplasty;  Surgeon: Hewitt Shorts, MD;  Location: [facility];  Service: Neurosurgery;  Laterality: N/A;  L1 Kyphoplasty  . Tubal ligation    . Foot surgery    . Wrist fusion Right     30 yrs ago  . Kyphoplasty N/A 02/07/2014    Procedure: THORACIC SIX KYPHOPLASTY;  Surgeon: Hewitt Shorts, MD;  Location: [facility]UR[facility] ORS;  Service: Neurosurgery;  Laterality: N/A;  T6 Kyphoplasty   . Hand tendon surgery Right 09/15/2014    "d/t RA"  . Tonsillectomy    . Posterior lumbar fusion      "got screws in"  . Back surgery    . Cataract extraction w/ intraocular lens  implant, bilateral Bilateral   . Finger arthroplasty Right 09/15/2014    Procedure: RIGHT MIDDLE FINGER, RING  FINGER, SMALL FINGER EXTENSOR DIGITORUM COMMUMIS STABILIZATION WITH RIGHT MIDDLE FINGER MCP ARTHROPLASTY, POSSIBLE RING FINGER AND SMALL FINGER MCP ARTHROPLASTY;  Surgeon: Dominica Severin, MD;  Location: [facility] OR;  Service: Orthopedics;  Laterality: Right;  . Breast biopsy Left     negative   Social History   Social History  . Marital Status: Married    Spouse Name: N/A  . Number of Children: N/A  . Years of Education: N/A   Occupational History  . retired    Social History Main Topics  . Smoking status: Former Smoker -- 1.00 packs/day for 53 years    Types: Cigarettes  . Smokeless tobacco: Never Used  . Alcohol Use: No  . Drug Use: No  . Sexual Activity: Yes   Other Topics Concern  . None   Social History Narrative   Lives with husband at home   Family History  Problem Relation Age of Onset  . CAD Father   . CAD Mother   . Hypertension Mother   . Peripheral vascular disease Mother   . Breast cancer Maternal Aunt 50   Allergies  Allergen Reactions  . Gold-Containing Drug Products Anaphylaxis  . Adhesive [Tape]     Tears skin  . Dilaudid [Hydromorphone Hcl]     Made tongue swell  . Pravastatin     Arms and legs hurt  Prior to Admission medications   Medication Sig Start Date End Date Taking? Authorizing Provider  albuterol (PROVENTIL HFA;VENTOLIN HFA) 108 (90 Base) MCG/ACT inhaler Inhale 2 puffs into the lungs every 6 (six) hours as needed for wheezing or shortness of breath.   Yes Historical Provider, MD  aspirin (ASPIRIN EC) 81 MG EC tablet Take 81 mg by mouth daily. Swallow whole.   Yes Historical Provider, MD  Calcium Carbonate-Vit D-Min (CALCIUM-VITAMIN D-MINERALS) 600-400 MG-UNIT CHEW Chew 2 tablets by mouth 2 (two) times daily.    Yes Historical Provider, MD  DULoxetine (CYMBALTA) 30 MG capsule Take 30 mg by mouth at bedtime.    Yes Historical Provider, MD  DULoxetine (CYMBALTA) 60 MG capsule Take 60 mg by mouth every morning.   Yes Historical Provider, MD   etanercept (ENBREL) 50 MG/ML injection Inject 50 mg into the skin once a week. Friday   Yes Historical Provider, MD  Fluticasone Furoate-Vilanterol (BREO ELLIPTA) 100-25 MCG/INH AEPB Inhale 1 puff into the lungs every morning.   Yes Historical Provider, MD  gabapentin (NEURONTIN) 300 MG capsule Take 600 mg by mouth 3 (three) times daily as needed (leg pain).    Yes Historical Provider, MD  ibuprofen (ADVIL,MOTRIN) 200 MG tablet Take 400 mg by mouth every 6 (six) hours as needed for mild pain.   Yes Historical Provider, MD  ipratropium-albuterol (DUONEB) 0.5-2.5 (3) MG/3ML SOLN Take 3 mLs by nebulization every 6 (six) hours as needed (SOB).    Yes Historical Provider, MD  Menthol-Methyl Salicylate (BEN GAY ARTHRITIS FORMULA) 8-30 % CREA Apply 1 application topically 2 (two) times daily as needed (for back pain).    Yes Historical Provider, MD  omeprazole (PRILOSEC) 20 MG capsule Take 20 mg by mouth 2 (two) times daily.    Yes Historical Provider, MD  traZODone (DESYREL) 50 MG tablet Take 50-100 mg by mouth at bedtime as needed for sleep.    Yes Historical Provider, MD  vitamin C (ASCORBIC ACID) 500 MG tablet Take 500 mg by mouth daily.   Yes Historical Provider, MD  zoledronic acid (RECLAST) 5 MG/100ML SOLN injection Inject 5 mg into the vein See admin instructions. *uses once a year*   Yes Historical Provider, MD  ciprofloxacin (CIPRO) 250 MG tablet Take 1 tablet (250 mg total) by mouth 2 (two) times daily. 09/08/15   Houston Siren, MD  insulin aspart (NOVOLOG) 100 UNIT/ML injection Inject 0-5 Units into the skin at bedtime. 09/08/15   Houston Siren, MD  insulin aspart (NOVOLOG) 100 UNIT/ML injection Inject 0-15 Units into the skin 3 (three) times daily with meals. 09/08/15   Houston Siren, MD  insulin glargine (LANTUS) 100 UNIT/ML injection Inject 0.1 mLs (10 Units total) into the skin daily. 09/08/15   Houston Siren, MD  Methotrexate, PF, 25 MG/0.4ML SOAJ Inject 50 mg into the skin once a week.  Friday    Historical Provider, MD  oxyCODONE-acetaminophen (PERCOCET) 10-325 MG tablet Take 1 tablet by mouth every 6 (six) hours as needed for pain. 09/08/15   Houston Siren, MD  QUEtiapine (SEROQUEL) 100 MG tablet Take 1 tablet (100 mg total) by mouth at bedtime. 09/08/15   Houston Siren, MD   Dg Shoulder Right  09/07/2015  CLINICAL DATA:  Humeral fracture, in a sling for 2 weeks, pain, done portable-best possible images obtained EXAM: RIGHT SHOULDER - 2+ VIEW COMPARISON:  08/21/2015 FINDINGS: Again noted is transverse fracture of the right humeral neck with impaction at the fracture  site. The humeral head appears located over the glenoid fossa on both AP and lateral views. Detail is limited. Presence or absence of callus cannot be assessed. Small right pleural effusion is noted. IMPRESSION: No significant change in alignment of right humeral fracture. Small right pleural effusion. Electronically Signed   By: Norva Pavlov M.D.   On: 09/07/2015 15:36    Positive ROS: All other systems have been reviewed and were otherwise negative with the exception of those mentioned in the HPI and as above.  Physical Exam: General: Alert, no acute distress  MUSCULOSKELETAL: Left wrist:  Patient's skin is intact. The dorsal skin abrasions are healing. She has resolving ecchymosis and erythema. She had point tenderness of the distal radius mild radial deviation deformity.  Right shoulder: Patient's skin is intact. She has ecchymosis in the right arm from her proximal humerus fracture. She has tenderness at the proximal humerus fracture. She has active flexion and extension of all digits on the right hand. Her hand is well-perfused and she has palpable radial pulse.  Bilateral lower extremities: Patient had significant abrasions on both knees from her fall. These appear to be healing. There is no active drainage. There is no erythema or fluctuance seen.  Assessment: #1 healing right proximal humerus fracture  in acceptable alignment  #2 bilateral lower extremity abrasions without signs of infection #3 possible left distal radius fracture   Plan: I examined the patient just prior to her transfer to skilled nursing facility. Her daughter is at the bedside. Patient has a right arm sling in place. I have reviewed her right shoulder x-rays from this hospitalization which show no further displacement of her fracture. The proximal humerus fracture remains acceptable position. Patient will continue with immobilization. She may perform physical therapy for pendulums and gentle shoulder range of motion as tolerated. She should not lift or weight-bear on the right upper extremity. Patient should follow-up in my office in approximately 2-3 weeks for reevaluation of her right shoulder.  I examined the patient's bilateral knee abrasions. There is no evidence of infection. He abrasions appear to have eschar.  There is no need for dressing and less drainage is noted. I would recommend wound care evaluation of her bilateral lower extremities while at her skilled nursing facility.  I examined the patient's left wrist today. She is point tender over the distal radius and has mild radial deviation and possible dorsal deformity. I placed her in a removable wrist splint today. I would recommend that she had wrist x-rays performed at her skilled nursing facility. Time did not allow for left wrist x-rays prior to her discharge today. Patient should continue wearing the splint until the fracture is officially ruled out by x-ray. She should also not weight-bear on the left upper extremity until x-ray evaluation of the left wrist can occur. If x-rays are not available at skilled nursing, then she should follow up in my office in one week for x-ray evaluation of the left wrist.    Juanell Fairly, MD    09/08/2015 4:44 PM

## 2015-09-08 NOTE — Progress Notes (Signed)
Pts HR 129. MD Sainaini notified. Orders received for 12 lead EKG. Respiratory notified. Will continue to monitor.

## 2015-09-08 NOTE — Discharge Summary (Signed)
[facility] [place] [facility]   PATIENT NAME: Michelle Cabrera    MR#:  [id number]  DATE OF BIRTH:  26-Jan-1946  DATE OF ADMISSION:  09/05/2015 ADMITTING PHYSICIAN: Ihor Austin, MD  DATE OF DISCHARGE: 09/08/2015  PRIMARY CARE PHYSICIAN: Lauro Regulus., MD    ADMISSION DIAGNOSIS:  Hypokalemia [E87.6] Confusion [R41.0] New onset type 2 diabetes mellitus (HCC) [E11.9]  DISCHARGE DIAGNOSIS:  Principal Problem:   New onset type 2 diabetes mellitus (HCC) Active Problems:   Altered mental status   Pressure ulcer   SECONDARY DIAGNOSIS:   Past Medical History  Diagnosis Date  . Hypertension   . Closed compression fracture of L1 lumbar vertebra (HCC) 11/2012  . COPD (chronic obstructive pulmonary disease) (HCC)   . Anxiety   . Depression   . GERD (gastroesophageal reflux disease)   . Rheumatoid arthritis (HCC)   . DDD (degenerative disc disease)   . Chronic lower back pain   . Collagen vascular disease [facility])     HOSPITAL COURSE:   70 year old female with past medical history of rheumatoid arthritis, chronic back pain, anxiety/depression, COPD, previous history of compression fracture, hypertension who presented to the hospital due to altered mental status and encephalopathy.  #1 altered mental status/encephalopathy-this was secondary to metabolic encephalopathy from underlying UTI. She has significant sundowning in the hospital and therefore was given some Haldol and also psychiatric consult was obtained. -Patient was treated for the underlying UTI and also started on Seroquel at bedtime. Patient's mental status has significantly improved and is close to baseline and therefore she is being discharge to skilled nursing facility.  #2 urinary tract infection-this is the cause of patient's altered mental status/encephalopathy -Patient was treated with IV ceftriaxone is currently being discharged on oral ciprofloxacin. Her urine cultures are  currently negative.  #3 hyperglycemia/new onset diabetes-patient sent to the hospital blood sugars greater than 300. -Patient is presently being discharged on Lantus and insulin sliding scale. Further titration severe patient's blood sugars can be done as an outpatient.  #4 hypokalemia-this has since then been supplemented and now resolved.  #5 anxiety/depression-patient was seen by psychiatry and he recommended continuing Cymbalta. Patient was charted on Seroquel to help with sleep and she is being discharged on that.  #6 rheumatoid arthritis-patient will resume her methotrexate.  #7 COPD-no acute exacerbation. She will continue her Breo/Ellipta, duonebs and albuterol inhaler as needed.   #8 Neuropathy - pt. Will cont. Neurontin.   #9. Chronic pain - pt. Was maintained on her Oxycodone and will resume that upon discharge.   #10 Right shoulder Fracture - non-operable.  Pt. Follows w/ Dr. Martha Clan.  Pt. Shoulder in a sling.  - cont follow up with ortho as outpatient.   Pt. Was seen by PT and they recommend SNF/STR and pt. Is being discharged there presently.   DISCHARGE CONDITIONS:   Stable  CONSULTS OBTAINED:  Treatment Team:  Audery Amel, MD Juanell Fairly, MD  DRUG ALLERGIES:   Allergies  Allergen Reactions  . Gold-Containing Drug Products Anaphylaxis  . Adhesive [Tape]     Tears skin  . Dilaudid [Hydromorphone Hcl]     Made tongue swell  . Pravastatin     Arms and legs hurt     DISCHARGE MEDICATIONS:   Current Discharge Medication List    START taking these medications   Details  ciprofloxacin (CIPRO) 250 MG tablet Take 1 tablet (250 mg total) by mouth 2 (two) times daily. Qty: 10 tablet, Refills: 0    !!  insulin aspart (NOVOLOG) 100 UNIT/ML injection Inject 0-5 Units into the skin at bedtime. Qty: 10 mL, Refills: 11    !! insulin aspart (NOVOLOG) 100 UNIT/ML injection Inject 0-15 Units into the skin 3 (three) times daily with meals. Qty: 10 mL,  Refills: 11    insulin glargine (LANTUS) 100 UNIT/ML injection Inject 0.1 mLs (10 Units total) into the skin daily. Qty: 10 mL, Refills: 11    QUEtiapine (SEROQUEL) 100 MG tablet Take 1 tablet (100 mg total) by mouth at bedtime.     !! - Potential duplicate medications found. Please discuss with provider.    CONTINUE these medications which have CHANGED   Details  oxyCODONE-acetaminophen (PERCOCET) 10-325 MG tablet Take 1 tablet by mouth every 6 (six) hours as needed for pain. Qty: 30 tablet, Refills: 0      CONTINUE these medications which have NOT CHANGED   Details  albuterol (PROVENTIL HFA;VENTOLIN HFA) 108 (90 Base) MCG/ACT inhaler Inhale 2 puffs into the lungs every 6 (six) hours as needed for wheezing or shortness of breath.    aspirin (ASPIRIN EC) 81 MG EC tablet Take 81 mg by mouth daily. Swallow whole.    Calcium Carbonate-Vit D-Min (CALCIUM-VITAMIN D-MINERALS) 600-400 MG-UNIT CHEW Chew 2 tablets by mouth 2 (two) times daily.     !! DULoxetine (CYMBALTA) 30 MG capsule Take 30 mg by mouth at bedtime.     !! DULoxetine (CYMBALTA) 60 MG capsule Take 60 mg by mouth every morning.    etanercept (ENBREL) 50 MG/ML injection Inject 50 mg into the skin once a week. Friday    Fluticasone Furoate-Vilanterol (BREO ELLIPTA) 100-25 MCG/INH AEPB Inhale 1 puff into the lungs every morning.    gabapentin (NEURONTIN) 300 MG capsule Take 600 mg by mouth 3 (three) times daily as needed (leg pain).     ibuprofen (ADVIL,MOTRIN) 200 MG tablet Take 400 mg by mouth every 6 (six) hours as needed for mild pain.    ipratropium-albuterol (DUONEB) 0.5-2.5 (3) MG/3ML SOLN Take 3 mLs by nebulization every 6 (six) hours as needed (SOB).     Menthol-Methyl Salicylate (BEN GAY ARTHRITIS FORMULA) 8-30 % CREA Apply 1 application topically 2 (two) times daily as needed (for back pain).     omeprazole (PRILOSEC) 20 MG capsule Take 20 mg by mouth 2 (two) times daily.     traZODone (DESYREL) 50 MG tablet  Take 50-100 mg by mouth at bedtime as needed for sleep.     vitamin C (ASCORBIC ACID) 500 MG tablet Take 500 mg by mouth daily.    zoledronic acid (RECLAST) 5 MG/100ML SOLN injection Inject 5 mg into the vein See admin instructions. *uses once a year*    Methotrexate, PF, 25 MG/0.4ML SOAJ Inject 50 mg into the skin once a week. Friday     !! - Potential duplicate medications found. Please discuss with provider.       DISCHARGE INSTRUCTIONS:   DIET:  Cardiac diet and Diabetic diet  DISCHARGE CONDITION:  Stable  ACTIVITY:  Activity as tolerated  OXYGEN:  Home Oxygen: No.   Oxygen Delivery: room air  DISCHARGE LOCATION:  nursing home   If you experience worsening of your admission symptoms, develop shortness of breath, life threatening emergency, suicidal or homicidal thoughts you must seek medical attention immediately by calling 911 or calling your MD immediately  if symptoms less severe.  You Must read complete instructions/literature along with all the possible adverse reactions/side effects for all the Medicines you take and that  have been prescribed to you. Take any new Medicines after you have completely understood and accpet all the possible adverse reactions/side effects.   Please note  You were cared for by a hospitalist during your hospital stay. If you have any questions about your discharge medications or the care you received while you were in the hospital after you are discharged, you can call the unit and asked to speak with the hospitalist on call if the hospitalist that took care of you is not available. Once you are discharged, your primary care physician will handle any further medical issues. Please note that NO REFILLS for any discharge medications will be authorized once you are discharged, as it is imperative that you return to your primary care physician (or establish a relationship with a primary care physician if you do not have one) for your aftercare  needs so that they can reassess your need for medications and monitor your lab values.     Today   Mental status a bit improved. Alert and awake but not completely oriented. Daughter at bedside. Afebrile.  VITAL SIGNS:  Blood pressure 114/80, pulse 129, temperature 98.6 F (37 C), temperature source Oral, resp. rate 18, height 5\' 2"  (1.575 m), weight 65.499 kg (144 lb 6.4 oz), SpO2 99 %.  I/O:   Intake/Output Summary (Last 24 hours) at 09/08/15 1352 Last data filed at 09/08/15 0630  Gross per 24 hour  Intake    400 ml  Output      0 ml  Net    400 ml    PHYSICAL EXAMINATION:  GENERAL:  70 y.o.-year-old patient lying in the bed in no acute distress.  EYES: Pupils equal, round, reactive to light and accommodation. No scleral icterus. Extraocular muscles intact.  HEENT: Head atraumatic, normocephalic. Oropharynx and nasopharynx clear.  NECK:  Supple, no jugular venous distention. No thyroid enlargement, no tenderness.  LUNGS: Normal breath sounds bilaterally, no wheezing, rales,rhonchi. No use of accessory muscles of respiration.  CARDIOVASCULAR: S1, S2 normal. No murmurs, rubs, or gallops.  ABDOMEN: Soft, non-tender, non-distended. Bowel sounds present. No organomegaly or mass.  EXTREMITIES: No pedal edema, cyanosis, or clubbing. Right shoulder in an sling.  NEUROLOGIC: Cranial nerves II through XII are intact. No focal motor or sensory defecits b/l.  PSYCHIATRIC: The patient is alert and oriented x 3. Good affect.  SKIN: No obvious rash, lesion, or ulcer.   DATA REVIEW:   CBC  Recent Labs Lab 09/06/15 0555  WBC 8.8  HGB 10.0*  HCT 30.1*  PLT 350    Chemistries   Recent Labs Lab 09/05/15 2131  09/08/15 0507  NA 136  < > 138  K 2.4*  < > 3.4*  CL 96*  < > 107  CO2 31  < > 23  GLUCOSE 382*  < > 169*  BUN 10  < > 9  CREATININE 0.49  < > 0.48  CALCIUM 7.6*  < > 7.6*  MG  --   < > 1.5*  AST 24  --   --   ALT 20  --   --   ALKPHOS 401*  --   --   BILITOT 0.7   --   --   < > = values in this interval not displayed.  Cardiac Enzymes No results for input(s): TROPONINI in the last 168 hours.  Microbiology Results  Results for orders placed or performed during the hospital encounter of 09/05/15  Urine culture     Status: None (  Preliminary result)   Collection Time: 09/05/15  9:35 PM  Result Value Ref Range Status   Specimen Description URINE, CLEAN CATCH  Final   Special Requests NONE  Final   Culture HOLDING FOR POSSIBLE PATHOGEN  Final   Report Status PENDING  Incomplete    RADIOLOGY:  Dg Shoulder Right  09/07/2015  CLINICAL DATA:  Humeral fracture, in a sling for 2 weeks, pain, done portable-best possible images obtained EXAM: RIGHT SHOULDER - 2+ VIEW COMPARISON:  08/21/2015 FINDINGS: Again noted is transverse fracture of the right humeral neck with impaction at the fracture site. The humeral head appears located over the glenoid fossa on both AP and lateral views. Detail is limited. Presence or absence of callus cannot be assessed. Small right pleural effusion is noted. IMPRESSION: No significant change in alignment of right humeral fracture. Small right pleural effusion. Electronically Signed   By: Norva Pavlov M.D.   On: 09/07/2015 15:36      Management plans discussed with the patient, family and they are in agreement.  CODE STATUS:     Code Status Orders        Start     Ordered   09/06/15 0405  Full code   Continuous     09/06/15 0404    Code Status History    Date Active Date Inactive Code Status Order ID Comments User Context   07/26/2015  3:51 PM 08/01/2015  4:32 PM Full Code [id number]  Shaune Pollack, MD Inpatient   09/15/2014  6:36 PM 09/16/2014  5:22 PM Full Code [id number]  Dominica Severin, MD Inpatient   02/07/2014 11:04 AM 02/07/2014 10:39 PM Full Code [id number]  Hewitt Shorts, MD Inpatient   12/26/2012 11:54 PM 01/05/2013  3:34 PM Full Code [id number]  Ron Parker, MD ED      TOTAL TIME TAKING CARE OF THIS PATIENT:  40 minutes.    Houston Siren M.D on 09/08/2015 at 1:52 PM  Between 7am to 6pm - Pager - [phone]  After 6pm go to [email or web address] - password [email or web address] [facility]  [place] [facility]  Office  [phone]  CC: Primary care physician; Lauro Regulus., MD

## 2015-09-08 NOTE — Progress Notes (Signed)
[employer] authorization was started today. Clinicals were faxed.   [name], LCSW [phone]

## 2015-09-08 NOTE — Clinical Social Work Placement (Signed)
   CLINICAL SOCIAL WORK PLACEMENT  NOTE  Date:  09/08/2015  Patient Details  Name: Michelle Cabrera MRN: [id number] Date of Birth: [birth year]  Clinical Social Work is seeking post-discharge placement for this patient at the Skilled  Nursing Facility level of care (*CSW will initial, date and re-position this form in  chart as items are completed):  Yes   Patient/family provided with [place] Clinical Social Work Department's list of facilities offering this level of care within the geographic area requested by the patient (or if unable, by the patient's family).  Yes   Patient/family informed of their freedom to choose among providers that offer the needed level of care, that participate in Medicare, Medicaid or managed care program needed by the patient, have an available bed and are willing to accept the patient.  Yes   Patient/family informed of [place]'s ownership interest in [facility] and [facility], as well as of the fact that they are under no obligation to receive care at these facilities.  PASRR submitted to EDS on 09/06/15     PASRR number received on 09/07/15     Existing PASRR number confirmed on       FL2 transmitted to all facilities in geographic area requested by pt/family on 09/06/15     FL2 transmitted to all facilities within larger geographic area on       Patient informed that his/her managed care company has contracts with or will negotiate with certain facilities, including the following:        Yes   Patient/family informed of bed offers received.  Patient chooses bed at  ([facility] )     Physician recommends and patient chooses bed at      Patient to be transferred to  ([facility] ) on 09/08/15.  Patient to be transferred to facility by  [facility] )     Patient family notified on 09/08/15 of transfer.  Name of family member notified:   (Patient's daughter Misty Stanley and son Gaylyn Rong are aware of D/C today. )     PHYSICIAN       Additional  Comment:    _______________________________________________ Haig Prophet, LCSW 09/08/2015, 2:18 PM

## 2015-09-08 NOTE — Progress Notes (Signed)
Pt prepared for transport.

## 2015-09-08 NOTE — Progress Notes (Signed)
Clinical Child psychotherapist (CSW) faxed Ortho note to [facility].   [name], LCSW [phone]

## 2015-09-08 NOTE — Progress Notes (Signed)
Inpatient Diabetes Program Recommendations  AACE/ADA: New Consensus Statement on Inpatient Glycemic Control (2015)  Target Ranges:  Prepandial:   less than 140 mg/dL      [facility] postprandial:   less than 180 mg/dL (1-2 hours)      Critically ill patients:  140 - 180 mg/dL   Met with Michelle Cabrera and her daughter regarding elevated blood sugars.  They are aware that her blood sugars were elevated at her last admission in January but she was discharged without medication and knew the elevation was a result of steroids given at that time.  Mr. Chalmers has a meter at home that the patient will be able to use when she gets home from [facility] until they can get in to see Dr. Ouida Sills. Encouraged  Mrs. Tangen to get her own meter.  We discussed insulin use at the hospital, A1C, the importance of blood sugar control and the reason it is elevated (steroids, stress)  A1C is likely elevated because Mrs. Pekar has been ill since the beginning of the year and has received steroids at both admissions.  Daughter will ensure she understands discharge instructions when Mrs. Struckman leaves [facility].  Gentry Fitz, RN, BA, MHA, CDE Diabetes Coordinator Inpatient Diabetes Program  [phone] (Team Pager) [phone] ([place]) 09/08/2015 1:15 PM

## 2015-09-08 NOTE — NC FL2 (Signed)
[place] MEDICAID FL2 LEVEL OF CARE SCREENING TOOL     IDENTIFICATION  Patient Name: Michelle Cabrera Birthdate: 03/24/1946 Sex: female Admission Date (Current Location): 09/05/2015  [employer] and [place] Number:  Chiropodist and Address:  [facility], [address] [place], [place] [address]      Provider Number: [id number]  Attending Physician Name and Address:  Houston Siren, MD  Relative Name and Phone Number:       Current Level of Care: Hospital Recommended Level of Care: Skilled Nursing Facility Prior Approval Number:    Date Approved/Denied:   PASRR Number:   [id number]    Discharge Plan: SNF    Current Diagnoses: Patient Active Problem List   Diagnosis Date Noted  . Pressure ulcer 09/08/2015  . New onset type 2 diabetes mellitus (HCC) 09/06/2015  . Altered mental status 09/06/2015  . COPD exacerbation (HCC) 07/26/2015  . Arthritis associated with another disorder 09/15/2014  . Wedge compression fracture of T6 vertebra (HCC) 02/07/2014  . Intractable low back pain 12/27/2012  . DDD (degenerative disc disease) 12/27/2012  . Rheumatoid arthritis (HCC) 12/27/2012  . Hypertension 12/27/2012    Orientation RESPIRATION BLADDER Height & Weight     Self, Situation, Place  O2 (Nasal Cannual 4 (L/min) ) Incontinent Weight: 144 lb 6.4 oz (65.499 kg) Height:  5\' 2"  (157.5 cm)  BEHAVIORAL SYMPTOMS/MOOD NEUROLOGICAL BOWEL NUTRITION STATUS   (None )  (None ) Incontinent Diet  AMBULATORY STATUS COMMUNICATION OF NEEDS Skin   Extensive Assist Verbally Normal                       Personal Care Assistance Level of Assistance  Bathing, Feeding, Dressing Bathing Assistance: Limited assistance Feeding assistance: Independent Dressing Assistance: Limited assistance     Functional Limitations Info  Sight, Hearing, Speech Sight Info: Adequate Hearing Info: Adequate Speech Info: Adequate    SPECIAL CARE FACTORS FREQUENCY   PT (By licensed PT)     PT Frequency:  (5)              Contractures      Additional Factors Info  Insulin Sliding Scale, Code Status, Allergies Code Status Info:  (Full Code ) Allergies Info:  (Gold-containing Drug Products, Adhesive, Dilaudid, Pravastatin)   Insulin Sliding Scale Info:  (insulin aspart (novoLOG) injection 0-15 Units 0-15 Units, Subcutaneous, 3 times daily with meals  & insulin aspart (novoLOG) injection 0-5 Units 0-5 Units, Subcutaneous, Daily at bedtime )       Current Medications (09/08/2015):  This is the current hospital active medication list Current Facility-Administered Medications  Medication Dose Route Frequency Provider Last Rate Last Dose  . 0.9 %  sodium chloride infusion   Intravenous Continuous 11/06/2015, MD 50 mL/hr at 09/08/15 [phone]    . acetaminophen (TYLENOL) tablet 650 mg  650 mg Oral Q6H PRN 4081, MD       Or  . acetaminophen (TYLENOL) suppository 650 mg  650 mg Rectal Q6H PRN Ihor Austin, MD      . aspirin EC tablet 81 mg  81 mg Oral Daily Ihor Austin, MD   81 mg at 09/08/15 0915  . calcium-vitamin D (OSCAL WITH D) 500-200 MG-UNIT per tablet 2 tablet  2 tablet Oral BID 11/06/15, MD   2 tablet at 09/08/15 0915  . cefTRIAXone (ROCEPHIN) 1 g in dextrose 5 % 50 mL IVPB  1 g Intravenous Q24H 11/06/15, MD  1 g at 09/08/15 1218  . docusate sodium (COLACE) capsule 100 mg  100 mg Oral BID Ihor Austin, MD   100 mg at 09/08/15 0915  . DULoxetine (CYMBALTA) DR capsule 30 mg  30 mg Oral QHS Ihor Austin, MD   30 mg at 09/07/15 2208  . DULoxetine (CYMBALTA) DR capsule 60 mg  60 mg Oral q morning - 10a Ihor Austin, MD   60 mg at 09/08/15 0915  . enoxaparin (LOVENOX) injection 40 mg  40 mg Subcutaneous Q24H Ihor Austin, MD   40 mg at 09/07/15 2158  . feeding supplement (GLUCERNA SHAKE) (GLUCERNA SHAKE) liquid 237 mL  237 mL Oral TID WC Alford Highland, MD   237 mL at 09/08/15 1220  . gabapentin (NEURONTIN) capsule 600  mg  600 mg Oral TID PRN Ihor Austin, MD      . haloperidol (HALDOL) tablet 1 mg  1 mg Oral Q6H PRN Alford Highland, MD      . haloperidol lactate (HALDOL) injection 1 mg  1 mg Intravenous Q4H PRN Audery Amel, MD   1 mg at 09/08/15 0236  . insulin aspart (novoLOG) injection 0-15 Units  0-15 Units Subcutaneous TID WC Ihor Austin, MD   3 Units at 09/08/15 1218  . insulin aspart (novoLOG) injection 0-5 Units  0-5 Units Subcutaneous QHS Ihor Austin, MD   2 Units at 09/07/15 2158  . insulin glargine (LANTUS) injection 10 Units  10 Units Subcutaneous Daily Alford Highland, MD   10 Units at 09/08/15 1222  . ipratropium-albuterol (DUONEB) 0.5-2.5 (3) MG/3ML nebulizer solution 3 mL  3 mL Nebulization Q6H PRN Ihor Austin, MD      . living well with diabetes book MISC   Does not apply Once Houston Siren, MD      . mometasone-formoterol [facility]) 100-5 MCG/ACT inhaler 2 puff  2 puff Inhalation BID Ihor Austin, MD   2 puff at 09/07/15 2208  . nicotine (NICODERM CQ - dosed in mg/24 hours) patch 21 mg  21 mg Transdermal Daily Alford Highland, MD   21 mg at 09/08/15 0917  . ondansetron (ZOFRAN) tablet 4 mg  4 mg Oral Q6H PRN Ihor Austin, MD   4 mg at 09/06/15 1717   Or  . ondansetron (ZOFRAN) injection 4 mg  4 mg Intravenous Q6H PRN Ihor Austin, MD      . oxyCODONE-acetaminophen (PERCOCET/ROXICET) 5-325 MG per tablet 1 tablet  1 tablet Oral Q6H PRN Alford Highland, MD   1 tablet at 09/08/15 1287   And  . oxyCODONE (Oxy IR/ROXICODONE) immediate release tablet 5 mg  5 mg Oral Q6H PRN Alford Highland, MD   5 mg at 09/08/15 1225  . pantoprazole (PROTONIX) EC tablet 40 mg  40 mg Oral Daily Ihor Austin, MD   40 mg at 09/08/15 0915  . potassium chloride (KLOR-CON) packet 20 mEq  20 mEq Oral Daily Alford Highland, MD   20 mEq at 09/08/15 0916  . QUEtiapine (SEROQUEL) tablet 100 mg  100 mg Oral QHS John T Clapacs, MD      . sodium chloride flush (NS) 0.9 % injection 3 mL  3 mL Intravenous Q12H Ihor Austin, MD   3 mL at 09/06/15 0932  . vitamin C (ASCORBIC ACID) tablet 500 mg  500 mg Oral Daily Pavan Pyreddy, MD   500 mg at 09/08/15 0915  . zinc oxide 20 % ointment   Topical BID PRN Alford Highland, MD  Discharge Medications: Please see discharge summary for a list of discharge medications.  Relevant Imaging Results:  Relevant Lab Results:   Additional Information  (SSN [id number])  Haig Prophet, LCSW

## 2015-09-08 NOTE — Progress Notes (Signed)
Report called to Kristen at [facility], EMS called for transportation.

## 2015-09-08 NOTE — Progress Notes (Signed)
Patient is medically stable for D/C to [facility] today. Per Jomarie Longs [facility] liaison patient is going to room 605. RN will call report to [address] Michelle Cabrera at [phone] and arrange EMS for transport. [facility] Medicare authorization was received. Auth # [id number]. Clinical Child psychotherapist (CSW) sent D/C Summary, FL2 and D/C Packet to [employer] via [employer]. Patient's daughter Michelle Cabrera is at bedside and aware of D/C today. CSW also spoke with patient's son Michelle Cabrera and answered all questions about rehab. Michelle Cabrera is in agreement with D/C today. Please reconsult if future social work needs arise. CSW signing off.   [name], LCSW [phone]

## 2015-09-08 NOTE — Care Management Important Message (Signed)
Important Message  Patient Details  Name: Michelle Cabrera MRN: [id number] Date of Birth: Nov 28, 1945   Medicare Important Message Given:  Yes    [name] Edris Friedt 09/08/2015, 9:50 AM

## 2015-09-13 LAB — URINE CULTURE

## 2015-09-16 ENCOUNTER — Other Ambulatory Visit
Admission: RE | Admit: 2015-09-16 | Discharge: 2015-09-16 | Disposition: A | Discharge status: Home / Self Care | Payer: Medicare Other | Source: Ambulatory Visit | Attending: Family Medicine | Admitting: Family Medicine

## 2015-09-16 DIAGNOSIS — D649 Anemia, unspecified: Secondary | ICD-10-CM | POA: Diagnosis present

## 2015-09-16 DIAGNOSIS — E119 Type 2 diabetes mellitus without complications: Secondary | ICD-10-CM | POA: Insufficient documentation

## 2015-09-16 LAB — CBC WITH DIFFERENTIAL/PLATELET
BASOS PCT: 1 %
Basophils Absolute: 0.1 10*3/uL (ref 0–0.1)
EOS PCT: 1 %
Eosinophils Absolute: 0.1 10*3/uL (ref 0–0.7)
HEMATOCRIT: 29.4 % — AB (ref 35.0–47.0)
Hemoglobin: 9.8 g/dL — ABNORMAL LOW (ref 12.0–16.0)
LYMPHS ABS: 1.9 10*3/uL (ref 1.0–3.6)
Lymphocytes Relative: 15 %
MCH: 30 pg (ref 26.0–34.0)
MCHC: 33.2 g/dL (ref 32.0–36.0)
MCV: 90.3 fL (ref 80.0–100.0)
MONO ABS: 1.5 10*3/uL — AB (ref 0.2–0.9)
Monocytes Relative: 12 %
Neutro Abs: 8.9 10*3/uL — ABNORMAL HIGH (ref 1.4–6.5)
Neutrophils Relative %: 71 %
PLATELETS: 963 10*3/uL — AB (ref 150–400)
RBC: 3.26 MIL/uL — ABNORMAL LOW (ref 3.80–5.20)
RDW: 20.1 % — AB (ref 11.5–14.5)
WBC: 12.5 10*3/uL — ABNORMAL HIGH (ref 3.6–11.0)

## 2015-09-16 LAB — COMPREHENSIVE METABOLIC PANEL
ALT: 12 U/L — AB (ref 14–54)
AST: 18 U/L (ref 15–41)
Albumin: 1.5 g/dL — ABNORMAL LOW (ref 3.5–5.0)
Alkaline Phosphatase: 199 U/L — ABNORMAL HIGH (ref 38–126)
Anion gap: 9 (ref 5–15)
BILIRUBIN TOTAL: 0.5 mg/dL (ref 0.3–1.2)
BUN: 9 mg/dL (ref 6–20)
CHLORIDE: 103 mmol/L (ref 101–111)
CO2: 29 mmol/L (ref 22–32)
Calcium: 7.4 mg/dL — ABNORMAL LOW (ref 8.9–10.3)
Glucose, Bld: 112 mg/dL — ABNORMAL HIGH (ref 65–99)
POTASSIUM: 3.3 mmol/L — AB (ref 3.5–5.1)
Sodium: 141 mmol/L (ref 135–145)
TOTAL PROTEIN: 5 g/dL — AB (ref 6.5–8.1)

## 2015-09-20 ENCOUNTER — Other Ambulatory Visit
Admission: RE | Admit: 2015-09-20 | Discharge: 2015-09-20 | Disposition: A | Discharge status: Home / Self Care | Payer: Medicare Other | Source: Ambulatory Visit | Attending: Family Medicine | Admitting: Family Medicine

## 2015-09-20 DIAGNOSIS — D649 Anemia, unspecified: Secondary | ICD-10-CM | POA: Insufficient documentation

## 2015-09-20 LAB — CBC WITH DIFFERENTIAL/PLATELET
BAND NEUTROPHILS: 1 %
BASOS ABS: 0 10*3/uL (ref 0–0.1)
BLASTS: 0 %
Basophils Relative: 0 %
EOS ABS: 0 10*3/uL (ref 0–0.7)
Eosinophils Relative: 0 %
HEMATOCRIT: 31.3 % — AB (ref 35.0–47.0)
HEMOGLOBIN: 10.2 g/dL — AB (ref 12.0–16.0)
Lymphocytes Relative: 15 %
Lymphs Abs: 2.3 10*3/uL (ref 1.0–3.6)
MCH: 30 pg (ref 26.0–34.0)
MCHC: 32.7 g/dL (ref 32.0–36.0)
MCV: 91.7 fL (ref 80.0–100.0)
METAMYELOCYTES PCT: 0 %
Monocytes Absolute: 1.2 10*3/uL — ABNORMAL HIGH (ref 0.2–0.9)
Monocytes Relative: 8 %
Myelocytes: 0 %
Neutro Abs: 12.1 10*3/uL — ABNORMAL HIGH (ref 1.4–6.5)
Neutrophils Relative %: 76 %
Other: 0 %
PROMYELOCYTES ABS: 0 %
Platelets: 880 10*3/uL — ABNORMAL HIGH (ref 150–440)
RBC: 3.41 MIL/uL — AB (ref 3.80–5.20)
RDW: 20.3 % — AB (ref 11.5–14.5)
SMEAR REVIEW: INCREASED
WBC: 15.6 10*3/uL — AB (ref 3.6–11.0)
nRBC: 0 /100 WBC

## 2015-09-20 LAB — COMPREHENSIVE METABOLIC PANEL
ALBUMIN: 1.7 g/dL — AB (ref 3.5–5.0)
ALK PHOS: 192 U/L — AB (ref 38–126)
ALT: 8 U/L — AB (ref 14–54)
AST: 26 U/L (ref 15–41)
Anion gap: 8 (ref 5–15)
BILIRUBIN TOTAL: 0.1 mg/dL — AB (ref 0.3–1.2)
BUN: 13 mg/dL (ref 6–20)
CO2: 29 mmol/L (ref 22–32)
CREATININE: 0.39 mg/dL — AB (ref 0.44–1.00)
Calcium: 8.1 mg/dL — ABNORMAL LOW (ref 8.9–10.3)
Chloride: 104 mmol/L (ref 101–111)
GFR calc Af Amer: >60 mL/min (ref >60)
GLUCOSE: 172 mg/dL — AB (ref 65–99)
POTASSIUM: 3.4 mmol/L — AB (ref 3.5–5.1)
Sodium: 141 mmol/L (ref 135–145)
TOTAL PROTEIN: 6 g/dL — AB (ref 6.5–8.1)

## 2015-10-01 ENCOUNTER — Other Ambulatory Visit
Admission: RE | Admit: 2015-10-01 | Discharge: 2015-10-01 | Disposition: A | Discharge status: Home / Self Care | Payer: Medicare Other | Source: Ambulatory Visit | Attending: Family Medicine | Admitting: Family Medicine

## 2015-10-01 DIAGNOSIS — N390 Urinary tract infection, site not specified: Secondary | ICD-10-CM | POA: Diagnosis present

## 2015-10-01 LAB — URINALYSIS COMPLETE WITH MICROSCOPIC (ARMC ONLY)
BILIRUBIN URINE: NEGATIVE
Glucose, UA: 50 mg/dL — AB
HGB URINE DIPSTICK: NEGATIVE
Ketones, ur: NEGATIVE mg/dL
NITRITE: NEGATIVE
PH: 5 (ref 5.0–8.0)
PROTEIN: 30 mg/dL — AB
Specific Gravity, Urine: 1.024 (ref 1.005–1.030)

## 2015-10-04 LAB — URINE CULTURE: Culture: >=100000

## 2015-10-19 ENCOUNTER — Emergency Department: Payer: Medicare Other

## 2015-10-19 ENCOUNTER — Emergency Department
Admission: EM | Admit: 2015-10-19 | Discharge: 2015-10-21 | Disposition: A | Discharge status: Other Acute Inpatient Hospital | Payer: Medicare Other | Attending: Emergency Medicine | Admitting: Emergency Medicine

## 2015-10-19 ENCOUNTER — Encounter: Payer: Self-pay | Admitting: Emergency Medicine

## 2015-10-19 DIAGNOSIS — Z794 Long term (current) use of insulin: Secondary | ICD-10-CM | POA: Diagnosis not present

## 2015-10-19 DIAGNOSIS — Z87891 Personal history of nicotine dependence: Secondary | ICD-10-CM | POA: Diagnosis not present

## 2015-10-19 DIAGNOSIS — Z7982 Long term (current) use of aspirin: Secondary | ICD-10-CM | POA: Diagnosis not present

## 2015-10-19 DIAGNOSIS — E119 Type 2 diabetes mellitus without complications: Secondary | ICD-10-CM | POA: Insufficient documentation

## 2015-10-19 DIAGNOSIS — R451 Restlessness and agitation: Secondary | ICD-10-CM | POA: Diagnosis not present

## 2015-10-19 DIAGNOSIS — Z792 Long term (current) use of antibiotics: Secondary | ICD-10-CM | POA: Insufficient documentation

## 2015-10-19 DIAGNOSIS — R410 Disorientation, unspecified: Secondary | ICD-10-CM | POA: Insufficient documentation

## 2015-10-19 DIAGNOSIS — R42 Dizziness and giddiness: Secondary | ICD-10-CM | POA: Diagnosis present

## 2015-10-19 DIAGNOSIS — R296 Repeated falls: Secondary | ICD-10-CM | POA: Diagnosis not present

## 2015-10-19 DIAGNOSIS — I10 Essential (primary) hypertension: Secondary | ICD-10-CM | POA: Insufficient documentation

## 2015-10-19 DIAGNOSIS — F0391 Unspecified dementia with behavioral disturbance: Secondary | ICD-10-CM | POA: Diagnosis not present

## 2015-10-19 DIAGNOSIS — F23 Brief psychotic disorder: Secondary | ICD-10-CM | POA: Diagnosis not present

## 2015-10-19 DIAGNOSIS — F0390 Unspecified dementia without behavioral disturbance: Secondary | ICD-10-CM

## 2015-10-19 DIAGNOSIS — Z7951 Long term (current) use of inhaled steroids: Secondary | ICD-10-CM | POA: Insufficient documentation

## 2015-10-19 LAB — URINALYSIS COMPLETE WITH MICROSCOPIC (ARMC ONLY)
BACTERIA UA: NONE SEEN
Bilirubin Urine: NEGATIVE
Glucose, UA: NEGATIVE mg/dL
Hgb urine dipstick: NEGATIVE
KETONES UR: NEGATIVE mg/dL
Nitrite: NEGATIVE
PH: 5 (ref 5.0–8.0)
PROTEIN: 30 mg/dL — AB
Specific Gravity, Urine: 1.027 (ref 1.005–1.030)
Trans Epithel, UA: 6

## 2015-10-19 LAB — COMPREHENSIVE METABOLIC PANEL
ALBUMIN: 3.3 g/dL — AB (ref 3.5–5.0)
ALT: 15 U/L (ref 14–54)
ANION GAP: 9 (ref 5–15)
AST: 24 U/L (ref 15–41)
Alkaline Phosphatase: 110 U/L (ref 38–126)
BUN: 17 mg/dL (ref 6–20)
CO2: 19 mmol/L — AB (ref 22–32)
Calcium: 8.2 mg/dL — ABNORMAL LOW (ref 8.9–10.3)
Chloride: 104 mmol/L (ref 101–111)
Creatinine, Ser: 0.55 mg/dL (ref 0.44–1.00)
GFR calc Af Amer: >60 mL/min (ref >60)
GFR calc non Af Amer: >60 mL/min (ref >60)
GLUCOSE: 124 mg/dL — AB (ref 65–99)
POTASSIUM: 3 mmol/L — AB (ref 3.5–5.1)
Sodium: 132 mmol/L — ABNORMAL LOW (ref 135–145)
Total Bilirubin: 0.4 mg/dL (ref 0.3–1.2)
Total Protein: 6.9 g/dL (ref 6.5–8.1)

## 2015-10-19 LAB — CBC WITH DIFFERENTIAL/PLATELET
Basophils Absolute: 0.1 10*3/uL (ref 0–0.1)
Basophils Relative: 1 %
Eosinophils Absolute: 0.3 10*3/uL (ref 0–0.7)
Eosinophils Relative: 3 %
HEMATOCRIT: 34.1 % — AB (ref 35.0–47.0)
Hemoglobin: 11.1 g/dL — ABNORMAL LOW (ref 12.0–16.0)
LYMPHS ABS: 3.1 10*3/uL (ref 1.0–3.6)
LYMPHS PCT: 29 %
MCH: 31.7 pg (ref 26.0–34.0)
MCHC: 32.4 g/dL (ref 32.0–36.0)
MCV: 97.8 fL (ref 80.0–100.0)
MONO ABS: 1.4 10*3/uL — AB (ref 0.2–0.9)
MONOS PCT: 13 %
NEUTROS ABS: 5.7 10*3/uL (ref 1.4–6.5)
Neutrophils Relative %: 54 %
Platelets: 535 10*3/uL — ABNORMAL HIGH (ref 150–440)
RBC: 3.49 MIL/uL — ABNORMAL LOW (ref 3.80–5.20)
RDW: 17.7 % — AB (ref 11.5–14.5)
WBC: 10.5 10*3/uL (ref 3.6–11.0)

## 2015-10-19 LAB — TROPONIN I: Troponin I: <0.03 ng/mL (ref <0.031)

## 2015-10-19 MED ORDER — DULOXETINE HCL 30 MG PO CPEP
30.0000 mg | ORAL_CAPSULE | Freq: Every day | ORAL | Status: DC
  Administered 2015-10-20 (×2): 30 mg via ORAL
  Filled 2015-10-19 (×3): qty 1

## 2015-10-19 MED ORDER — GABAPENTIN 300 MG PO CAPS
600.0000 mg | ORAL_CAPSULE | Freq: Three times a day (TID) | ORAL | Status: DC | PRN
  Administered 2015-10-20: 600 mg via ORAL
  Filled 2015-10-19: qty 2

## 2015-10-19 MED ORDER — IPRATROPIUM-ALBUTEROL 0.5-2.5 (3) MG/3ML IN SOLN: 3.0000 mL | Freq: Four times a day (QID) | RESPIRATORY_TRACT | Status: DC | PRN

## 2015-10-19 MED ORDER — ASPIRIN EC 81 MG PO TBEC
81.0000 mg | DELAYED_RELEASE_TABLET | Freq: Every day | ORAL | Status: DC
  Administered 2015-10-20: 81 mg via ORAL
  Filled 2015-10-19: qty 1

## 2015-10-19 MED ORDER — BENGAY ARTHRITIS FORMULA 8-30 % EX CREA: 1.0000 "application " | TOPICAL_CREAM | Freq: Two times a day (BID) | CUTANEOUS | Status: DC | PRN

## 2015-10-19 MED ORDER — QUETIAPINE FUMARATE 25 MG PO TABS
100.0000 mg | ORAL_TABLET | Freq: Every day | ORAL | Status: DC
  Administered 2015-10-19 – 2015-10-20 (×2): 100 mg via ORAL
  Filled 2015-10-19 (×2): qty 4

## 2015-10-19 MED ORDER — CALCIUM CARBONATE-VITAMIN D 500-200 MG-UNIT PO TABS
2.0000 | ORAL_TABLET | Freq: Two times a day (BID) | ORAL | Status: DC
  Administered 2015-10-20: 2 via ORAL
  Filled 2015-10-19 (×3): qty 2

## 2015-10-19 MED ORDER — FLUTICASONE FUROATE-VILANTEROL 100-25 MCG/INH IN AEPB: 1.0000 | INHALATION_SPRAY | Freq: Every morning | RESPIRATORY_TRACT | Status: DC

## 2015-10-19 MED ORDER — OXYCODONE-ACETAMINOPHEN 5-325 MG PO TABS
2.0000 | ORAL_TABLET | Freq: Once | ORAL | Status: AC
  Administered 2015-10-19: 2 via ORAL
  Filled 2015-10-19: qty 2

## 2015-10-19 MED ORDER — DULOXETINE HCL 60 MG PO CPEP
60.0000 mg | ORAL_CAPSULE | Freq: Every morning | ORAL | Status: DC
  Administered 2015-10-20: 60 mg via ORAL
  Filled 2015-10-19: qty 1

## 2015-10-19 MED ORDER — CALCIUM-VITAMIN D-MINERALS 600-400 MG-UNIT PO CHEW: 2.0000 | CHEWABLE_TABLET | Freq: Two times a day (BID) | ORAL | Status: DC

## 2015-10-19 MED ORDER — HALOPERIDOL LACTATE 5 MG/ML IJ SOLN: 2.5000 mg | Freq: Once | INTRAMUSCULAR | Status: DC

## 2015-10-19 MED ORDER — PANTOPRAZOLE SODIUM 40 MG PO TBEC
40.0000 mg | DELAYED_RELEASE_TABLET | Freq: Every day | ORAL | Status: DC
  Administered 2015-10-20: 40 mg via ORAL
  Filled 2015-10-19: qty 1

## 2015-10-19 MED ORDER — RISPERIDONE 0.5 MG PO TABS
0.2500 mg | ORAL_TABLET | Freq: Two times a day (BID) | ORAL | Status: DC
  Administered 2015-10-20 – 2015-10-21 (×3): 0.25 mg via ORAL
  Filled 2015-10-19 (×3): qty 1

## 2015-10-19 MED ORDER — VITAMIN C 500 MG PO TABS
500.0000 mg | ORAL_TABLET | Freq: Every day | ORAL | Status: DC
  Administered 2015-10-20: 500 mg via ORAL
  Filled 2015-10-19: qty 1

## 2015-10-19 MED ORDER — INSULIN GLARGINE 100 UNIT/ML ~~LOC~~ SOLN: 10.0000 [IU] | Freq: Every day | SUBCUTANEOUS | Status: DC

## 2015-10-19 MED ORDER — INSULIN ASPART 100 UNIT/ML ~~LOC~~ SOLN: 0.0000 [IU] | Freq: Three times a day (TID) | SUBCUTANEOUS | Status: DC

## 2015-10-19 MED ORDER — IBUPROFEN 400 MG PO TABS
400.0000 mg | ORAL_TABLET | Freq: Four times a day (QID) | ORAL | Status: DC | PRN
  Administered 2015-10-20: 400 mg via ORAL
  Filled 2015-10-19: qty 1

## 2015-10-19 MED ORDER — INSULIN ASPART 100 UNIT/ML ~~LOC~~ SOLN: 0.0000 [IU] | Freq: Every day | SUBCUTANEOUS | Status: DC

## 2015-10-19 MED ORDER — ALBUTEROL SULFATE HFA 108 (90 BASE) MCG/ACT IN AERS: 2.0000 | INHALATION_SPRAY | Freq: Four times a day (QID) | RESPIRATORY_TRACT | Status: DC | PRN

## 2015-10-19 NOTE — ED Notes (Signed)
Cleda Daub, MD at bedside.

## 2015-10-19 NOTE — BHH Counselor (Signed)
Pt has been medically cleared for Geriatric inpatient placement.  Referral packet submitted to the following:  [facility] [phone])  [facility]arl[facility] Plater [phone]  [facility]  [phone]  [facility] [phone])  [facility] Rushie Goltz [phone]

## 2015-10-19 NOTE — Consult Note (Signed)
[facility] Consult   Reason for Consult:  Consult for this 70 year old woman brought in by her family with concerns about worsening confusion and paranoia Referring Physician:  Joni Fears Patient Identification: Michelle Cabrera MRN:  [id number] Principal Diagnosis: Psychosis in elderly Diagnosis:   Patient Active Problem List   Diagnosis Date Noted  . 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 Pressure ulcer [L89.90] 09/08/2015  . New onset type 2 diabetes mellitus ([place]) [E11.9] 09/06/2015  . Altered mental status [R41.82] 09/06/2015  . COPD exacerbation ([place]) [J44.1] 07/26/2015  . Arthritis associated with another disorder [M19.90] 09/15/2014  . Wedge compression fracture of T6 vertebra ([place]) [S22.050A] 02/07/2014  . Intractable low back pain [M54.5] 12/27/2012  . DDD (degenerative disc disease) [IMO0002] 12/27/2012  . Rheumatoid arthritis ([place]) [M06.9] 12/27/2012  . Hypertension [I10] 12/27/2012    Total Time spent with patient: 1 hour  Subjective:   Michelle Cabrera is a 70 y.o. female patient admitted with "I'm just not recovering".  HPI:  Patient interviewed. Spoke with her family both in her presence and out in the hallway. Chart reviewed. I spoke to her also when she was in the hospital a month or so ago. Labs reviewed and case discussed with emergency room physician. 70 year old woman with a history of rheumatoid arthritis high blood pressure COPD chronic back pain. She is brought in by her family because her paranoia and agitation have been getting worse. She had been at a rehabilitation center for some time but now has gone back home to live with her husband. Now that she is back her paranoia is even worse. She is up all night acting like she is seeing things and talking to things. In the morning she is accusing her husband of all manner of bizarre things and appears to be having visual hallucinations. Mood is labile and frequently tearful. On interviewed she  tells me that she is feeling very upset. She tells me that some time she wishes that she would die although she has no thought of trying to hurt her self. She complains most bitterly of the pain in her back but second most of the terrible things that she says that her husband is doing. The nature of them is pretty bizarre and unbelievable and other family members also vouch that none of those things are true. Patient was having some confusion and delirium when she was in the hospital not too long ago and was started on Seroquel and is on 100 mg at night. Despite this she is still not sleeping well and is having psychotic symptoms. Furthermore she is getting more unsteady on her feet and has been having more falls.  Social history: Lives third husband. Sister is also closely involved as is an adult son. The husband appears to be very appropriately concerned.  Medical history: Multiple chronic serious medical problems including chronic degenerative disc disease with chronic back pain that has been treated with opiates for quite a while. She has rheumatoid arthritis high blood pressure COPD as well also now has developed diabetes. She is on multiple medications.  Substance abuse history: Very distant history possibly of some substance abuse but nothing recent there is no apparent elevated of any current miss use of medicine.  Past Psychiatric History: Patient was seen here in the hospital not long ago and at that time was delirious and confused although it got better during the hospitalization. Prior to that she has some distant history of what sounds like mild to  moderate depression no past hospitalization no history of suicide attempts. No past history of psychotic disorder.  Risk to Self: Is patient at risk for suicide?: No Risk to Others:   Prior Inpatient Therapy:   Prior Outpatient Therapy:    Past Medical History:  Past Medical History  Diagnosis Date  . Hypertension   . Closed compression  fracture of L1 lumbar vertebra ([place]) 11/2012  . COPD (chronic obstructive pulmonary disease) ([place])   . Anxiety   . Depression   . GERD (gastroesophageal reflux disease)   . Rheumatoid arthritis ([place])   . DDD (degenerative disc disease)   . Chronic lower back pain   . Collagen vascular disease [facility])     Past Surgical History  Procedure Laterality Date  . Lumbar disc surgery       X 3  . Kyphoplasty N/A 01/04/2013    Procedure: L1 Kyphoplasty;  Surgeon: Hosie Spangle, MD;  Location: [place] [facility];  Service: Neurosurgery;  Laterality: N/A;  L1 Kyphoplasty  . Tubal ligation    . Foot surgery    . Wrist fusion Right     30 yrs ago  . Kyphoplasty N/A 02/07/2014    Procedure: THORACIC SIX KYPHOPLASTY;  Surgeon: Hosie Spangle, MD;  Location: [place] [facility];  Service: Neurosurgery;  Laterality: N/A;  T6 Kyphoplasty   . Hand tendon surgery Right 09/15/2014    "d/t RA"  . Tonsillectomy    . Posterior lumbar fusion      "got screws in"  . Back surgery    . Cataract extraction w/ intraocular lens  implant, bilateral Bilateral   . Finger arthroplasty Right 09/15/2014    Procedure: RIGHT MIDDLE FINGER, RING FINGER, SMALL FINGER EXTENSOR DIGITORUM COMMUMIS STABILIZATION WITH RIGHT MIDDLE FINGER MCP ARTHROPLASTY, POSSIBLE RING FINGER AND SMALL FINGER MCP ARTHROPLASTY;  Surgeon: Roseanne Kaufman, MD;  Location: [place];  Service: Orthopedics;  Laterality: Right;  . Breast biopsy Left     negative   Family History:  Family History  Problem Relation Age of Onset  . CAD Father   . CAD Mother   . Hypertension Mother   . Peripheral vascular disease Mother   . Breast cancer Maternal Aunt 45   Family Psychiatric  History: No significant family history of mental health problems or substance abuse Social History:  History  Alcohol Use No     History  Drug Use No    Social History   Social History  . Marital Status: Married    Spouse Name: N/A  . Number of Children: N/A  . Years of  Education: N/A   Occupational History  . retired    Social History Main Topics  . Smoking status: Former Smoker -- 1.00 packs/day for 53 years    Types: Cigarettes  . Smokeless tobacco: Never Used  . Alcohol Use: No  . Drug Use: No  . Sexual Activity: Yes   Other Topics Concern  . None   Social History Narrative   Lives with husband at home   Additional Social History:    Allergies:   Allergies  Allergen Reactions  . Gold-Containing Drug Products Anaphylaxis  . Adhesive [Tape]     Tears skin  . Dilaudid [Hydromorphone Hcl]     Made tongue swell  . Pravastatin     Arms and legs hurt     Labs:  Results for orders placed or performed during the hospital encounter of 10/19/15 (from the past 48 hour(s))  Comprehensive metabolic  panel     Status: Abnormal   Collection Time: 10/19/15  1:42 PM  Result Value Ref Range   Sodium 132 (L) 135 - 145 mmol/L   Potassium 3.0 (L) 3.5 - 5.1 mmol/L   Chloride 104 101 - 111 mmol/L   CO2 19 (L) 22 - 32 mmol/L   Glucose, Bld 124 (H) 65 - 99 mg/dL   BUN 17 6 - 20 mg/dL   Creatinine, Ser 0.55 0.44 - 1.00 mg/dL   Calcium 8.2 (L) 8.9 - 10.3 mg/dL   Total Protein 6.9 6.5 - 8.1 g/dL   Albumin 3.3 (L) 3.5 - 5.0 g/dL   AST 24 15 - 41 U/L   ALT 15 14 - 54 U/L   Alkaline Phosphatase 110 38 - 126 U/L   Total Bilirubin 0.4 0.3 - 1.2 mg/dL   GFR calc non Af Amer >60 >60 mL/min   GFR calc Af Amer >60 >60 mL/min    Comment: (NOTE) The eGFR has been calculated using the CKD EPI equation. This calculation has not been validated in all clinical situations. eGFR's persistently <60 mL/min signify possible Chronic Kidney Disease.    Anion gap 9 5 - 15  CBC with Differential     Status: Abnormal   Collection Time: 10/19/15  1:42 PM  Result Value Ref Range   WBC 10.5 3.6 - 11.0 K/uL   RBC 3.49 (L) 3.80 - 5.20 MIL/uL   Hemoglobin 11.1 (L) 12.0 - 16.0 g/dL   HCT 34.1 (L) 35.0 - 47.0 %   MCV 97.8 80.0 - 100.0 fL   MCH 31.7 26.0 - 34.0 pg   MCHC  32.4 32.0 - 36.0 g/dL   RDW 17.7 (H) 11.5 - 14.5 %   Platelets 535 (H) 150 - 440 K/uL   Neutrophils Relative % 54 %   Neutro Abs 5.7 1.4 - 6.5 K/uL   Lymphocytes Relative 29 %   Lymphs Abs 3.1 1.0 - 3.6 K/uL   Monocytes Relative 13 %   Monocytes Absolute 1.4 (H) 0.2 - 0.9 K/uL   Eosinophils Relative 3 %   Eosinophils Absolute 0.3 0 - 0.7 K/uL   Basophils Relative 1 %   Basophils Absolute 0.1 0 - 0.1 K/uL  Urinalysis complete, with microscopic ([facility] only)     Status: Abnormal   Collection Time: 10/19/15  1:42 PM  Result Value Ref Range   Color, Urine AMBER (A) YELLOW   APPearance HAZY (A) CLEAR   Glucose, UA NEGATIVE NEGATIVE mg/dL   Bilirubin Urine NEGATIVE NEGATIVE   Ketones, ur NEGATIVE NEGATIVE mg/dL   Specific Gravity, Urine 1.027 1.005 - 1.030   Hgb urine dipstick NEGATIVE NEGATIVE   pH 5.0 5.0 - 8.0   Protein, ur 30 (A) NEGATIVE mg/dL   Nitrite NEGATIVE NEGATIVE   Leukocytes, UA 1+ (A) NEGATIVE   RBC / HPF 0-5 0 - 5 RBC/hpf   WBC, UA 0-5 0 - 5 WBC/hpf   Bacteria, UA NONE SEEN NONE SEEN   Squamous Epithelial / LPF 0-5 (A) NONE SEEN   Trans Epithel, UA 6    Mucous PRESENT    Hyaline Casts, UA PRESENT   Troponin I     Status: None   Collection Time: 10/19/15  1:42 PM  Result Value Ref Range   Troponin I <0.03 <0.031 ng/mL    Comment:        NO INDICATION OF MYOCARDIAL INJURY.     No current facility-administered medications for this encounter.   Current  Outpatient Prescriptions  Medication Sig Dispense Refill  . albuterol (PROVENTIL HFA;VENTOLIN HFA) 108 (90 Base) MCG/ACT inhaler Inhale 2 puffs into the lungs every 6 (six) hours as needed for wheezing or shortness of breath.    Marland Kitchen aspirin (ASPIRIN EC) 81 MG EC tablet Take 81 mg by mouth daily. Swallow whole.    . Calcium Carbonate-Vit D-Min (CALCIUM-VITAMIN D-MINERALS) 600-400 MG-UNIT CHEW Chew 2 tablets by mouth 2 (two) times daily.     . ciprofloxacin (CIPRO) 250 MG tablet Take 1 tablet (250 mg total) by mouth 2  (two) times daily. 10 tablet 0  . DULoxetine (CYMBALTA) 30 MG capsule Take 30 mg by mouth at bedtime.     . DULoxetine (CYMBALTA) 60 MG capsule Take 60 mg by mouth every morning.    . etanercept (ENBREL) 50 MG/ML injection Inject 50 mg into the skin once a week. Friday    . Fluticasone Furoate-Vilanterol (BREO ELLIPTA) 100-25 MCG/INH AEPB Inhale 1 puff into the lungs every morning.    . gabapentin (NEURONTIN) 300 MG capsule Take 600 mg by mouth 3 (three) times daily as needed (leg pain).     Marland Kitchen ibuprofen (ADVIL,MOTRIN) 200 MG tablet Take 400 mg by mouth every 6 (six) hours as needed for mild pain.    Marland Kitchen insulin aspart (NOVOLOG) 100 UNIT/ML injection Inject 0-5 Units into the skin at bedtime. 10 mL 11  . insulin aspart (NOVOLOG) 100 UNIT/ML injection Inject 0-15 Units into the skin 3 (three) times daily with meals. 10 mL 11  . insulin glargine (LANTUS) 100 UNIT/ML injection Inject 0.1 mLs (10 Units total) into the skin daily. 10 mL 11  . ipratropium-albuterol (DUONEB) 0.5-2.5 (3) MG/3ML SOLN Take 3 mLs by nebulization every 6 (six) hours as needed (SOB).     . Menthol-Methyl Salicylate (BEN GAY ARTHRITIS FORMULA) 8-30 % CREA Apply 1 application topically 2 (two) times daily as needed (for back pain).     . Methotrexate, PF, 25 MG/0.4ML SOAJ Inject 50 mg into the skin once a week. Friday    . omeprazole (PRILOSEC) 20 MG capsule Take 20 mg by mouth 2 (two) times daily.     Marland Kitchen oxyCODONE-acetaminophen (PERCOCET) 10-325 MG tablet Take 1 tablet by mouth every 6 (six) hours as needed for pain. 30 tablet 0  . QUEtiapine (SEROQUEL) 100 MG tablet Take 1 tablet (100 mg total) by mouth at bedtime.    . traZODone (DESYREL) 50 MG tablet Take 50-100 mg by mouth at bedtime as needed for sleep.     . vitamin C (ASCORBIC ACID) 500 MG tablet Take 500 mg by mouth daily.    . zoledronic acid (RECLAST) 5 MG/100ML SOLN injection Inject 5 mg into the vein See admin instructions. *uses once a year*       Musculoskeletal: Strength & Muscle Tone: decreased and atrophy Gait & Station: unsteady Patient leans: Backward  Psychiatric Specialty Exam: Review of Systems  Eyes: Negative.   Respiratory: Negative.   Cardiovascular: Negative.   Gastrointestinal: Negative.   Musculoskeletal: Positive for back pain and neck pain.  Skin: Positive for rash.  Neurological: Positive for weakness.  Psychiatric/Behavioral: Positive for depression, suicidal ideas, hallucinations and memory loss. Negative for substance abuse. The patient is nervous/anxious and has insomnia.     Blood pressure 158/74, pulse 71, temperature 98.4 F (36.9 C), temperature source Oral, resp. rate 18, height _0  (1.6 m), SpO2 100 %.There is no weight on file to calculate BMI.  General Appearance: Casual  Eye Contact::  Fair  Speech:  Garbled and Slurred  Volume:  Decreased  Mood:  Angry and Dysphoric  Affect:  Depressed and Tearful  Thought Process:  Disorganized and Tangential  Orientation:  Full (Time, Place, and Person)  Thought Content:  Paranoid Ideation  Suicidal Thoughts:  Yes.  without intent/plan  Homicidal Thoughts:  No  Memory:  Immediate;   Good Recent;   Good Remote;   Fair  Judgement:  Impaired  Insight:  Shallow  Psychomotor Activity:  Decreased  Concentration:  Poor  Recall:  Poor  Fund of Knowledge:Fair  Language: Fair  Akathisia:  Negative  Handed:  Right  AIMS (if indicated):     Assets:  Financial Resources/Insurance Housing Social Support  ADL's:  Impaired  Cognition: Impaired,  Mild  Sleep:      Treatment Plan Summary: Daily contact with patient to assess and evaluate symptoms and progress in treatment, Medication management and Plan 70 year old woman who is presenting with paranoid ideation that is associated also with angry and depressed mood tearfulness some degree of agitation. She has become more unmanageable at home. Higher risk of falls. Disruptive at home. Patient's insight  is only partial at best and at times disappears completely. So far the medical workup has not shown any single obvious cause for a delirium or new psychosis. CT scan is unchanged. Labs largely unremarkable no urinary tract infection. I discussed the case with the patient's family and with the emergency room physician. If there is no justification to admit her to the medical service here we will need to look into a geriatric psychiatry unit for geriatric psychosis. Family would not feel comfortable with taking her home and the risks would be elevated to just try and adjust her medication and send her back to an environment where she would be at high risk to fall. I'm going to make sure she is on the Seroquel at night and we can add small doses of antipsychotics during the day as well and see if that helps a little bit. TTS is involved we will work on trying to get her referred to a geropsychiatry hospital. Dr. Joni Fears plans to file commitment papers.  Disposition: Recommend psychiatric Inpatient admission when medically cleared. Supportive therapy provided about ongoing stressors.  Alethia Berthold, MD 10/19/2015 5:13 PM

## 2015-10-19 NOTE — ED Notes (Signed)
Pt wears 2L home O2

## 2015-10-19 NOTE — ED Provider Notes (Signed)
[facility] Emergency Department Provider Note  ____________________________________________  Time seen: 3:20 PM  I have reviewed the triage vital signs and the nursing notes.   HISTORY  Chief Complaint Dizziness Level 5 caveat:  Portions of the history and physical were unable to be obtained due to the patient's acute illness and altered mental status Additional history obtained from son and husband at bedside.   HPI Michelle Cabrera is a 70 y.o. female brought to the ED due to multiple falls at home and confusion. Family also reports that she speaks incoherently and is agitated frequently and also appears to be having visual hallucinations and describing events outside that are not actually happening [place] tobacco digging up a whole bunch of trees when actually nothing of the sort is occurring. Therefore that she does have frequent UTIs and are wondering if she has UTI currently. They discussed these concerns with primary care by phone today who encouraged him to come to the emergency department possibly under involuntary commitment for evaluation.  Patient states "you know when they play those movies while you're asleep and sometimes they're magical and sometimes they're terrible? Mind have been terrible and I want to know who authorized that and gave me that injection in my joint."   Past Medical History  Diagnosis Date  . Hypertension   . Closed compression fracture of L1 lumbar vertebra (HCC) 11/2012  . COPD (chronic obstructive pulmonary disease) (HCC)   . Anxiety   . Depression   . GERD (gastroesophageal reflux disease)   . Rheumatoid arthritis (HCC)   . DDD (degenerative disc disease)   . Chronic lower back pain   . Collagen vascular disease [facility])      Patient Active Problem List   Diagnosis Date Noted  . 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 Pressure ulcer 09/08/2015  . New onset type 2 diabetes mellitus (HCC) 09/06/2015  . Altered mental  status 09/06/2015  . COPD exacerbation (HCC) 07/26/2015  . Arthritis associated with another disorder 09/15/2014  . Wedge compression fracture of T6 vertebra (HCC) 02/07/2014  . Intractable low back pain 12/27/2012  . DDD (degenerative disc disease) 12/27/2012  . Rheumatoid arthritis (HCC) 12/27/2012  . Hypertension 12/27/2012     Past Surgical History  Procedure Laterality Date  . Lumbar disc surgery       X 3  . Kyphoplasty N/A 01/04/2013    Procedure: L1 Kyphoplasty;  Surgeon: Hewitt Shorts, MD;  Location: [facility]S;  Service: Neurosurgery;  Laterality: N/A;  L1 Kyphoplasty  . Tubal ligation    . Foot surgery    . Wrist fusion Right     30 yrs ago  . Kyphoplasty N/A 02/07/2014    Procedure: THORACIC SIX KYPHOPLASTY;  Surgeon: Hewitt Shorts, MD;  Location: [facility];  Service: Neurosurgery;  Laterality: N/A;  T6 Kyphoplasty   . Hand tendon surgery Right 09/15/2014    "d/t RA"  . Tonsillectomy    . Posterior lumbar fusion      "got screws in"  . Back surgery    . Cataract extraction w/ intraocular lens  implant, bilateral Bilateral   . Finger arthroplasty Right 09/15/2014    Procedure: RIGHT MIDDLE FINGER, RING FINGER, SMALL FINGER EXTENSOR DIGITORUM COMMUMIS STABILIZATION WITH RIGHT MIDDLE FINGER MCP ARTHROPLASTY, POSSIBLE RING FINGER AND SMALL FINGER MCP ARTHROPLASTY;  Surgeon: Dominica Severin, MD;  Location: [facility] OR;  Service: Orthopedics;  Laterality: Right;  . Breast biopsy Left     negative  Current Outpatient Rx  Name  Route  Sig  Dispense  Refill  . albuterol (PROVENTIL HFA;VENTOLIN HFA) 108 (90 Base) MCG/ACT inhaler   Inhalation   Inhale 2 puffs into the lungs every 6 (six) hours as needed for wheezing or shortness of breath.         Marland Kitchen aspirin (ASPIRIN EC) 81 MG EC tablet   Oral   Take 81 mg by mouth daily. Swallow whole.         . Calcium Carbonate-Vit D-Min (CALCIUM-VITAMIN D-MINERALS) 600-400 MG-UNIT CHEW   Oral   Chew 2 tablets by mouth 2  (two) times daily.          . ciprofloxacin (CIPRO) 250 MG tablet   Oral   Take 1 tablet (250 mg total) by mouth 2 (two) times daily.   10 tablet   0   . DULoxetine (CYMBALTA) 30 MG capsule   Oral   Take 30 mg by mouth at bedtime.          . DULoxetine (CYMBALTA) 60 MG capsule   Oral   Take 60 mg by mouth every morning.         . etanercept (ENBREL) 50 MG/ML injection   Subcutaneous   Inject 50 mg into the skin once a week. Friday         . Fluticasone Furoate-Vilanterol (BREO ELLIPTA) 100-25 MCG/INH AEPB   Inhalation   Inhale 1 puff into the lungs every morning.         . gabapentin (NEURONTIN) 300 MG capsule   Oral   Take 600 mg by mouth 3 (three) times daily as needed (leg pain).          Marland Kitchen ibuprofen (ADVIL,MOTRIN) 200 MG tablet   Oral   Take 400 mg by mouth every 6 (six) hours as needed for mild pain.         Marland Kitchen insulin aspart (NOVOLOG) 100 UNIT/ML injection   Subcutaneous   Inject 0-5 Units into the skin at bedtime.   10 mL   11   . insulin aspart (NOVOLOG) 100 UNIT/ML injection   Subcutaneous   Inject 0-15 Units into the skin 3 (three) times daily with meals.   10 mL   11   . insulin glargine (LANTUS) 100 UNIT/ML injection   Subcutaneous   Inject 0.1 mLs (10 Units total) into the skin daily.   10 mL   11   . ipratropium-albuterol (DUONEB) 0.5-2.5 (3) MG/3ML SOLN   Nebulization   Take 3 mLs by nebulization every 6 (six) hours as needed (SOB).          . Menthol-Methyl Salicylate (BEN GAY ARTHRITIS FORMULA) 8-30 % CREA   Apply externally   Apply 1 application topically 2 (two) times daily as needed (for back pain).          . Methotrexate, PF, 25 MG/0.4ML SOAJ   Subcutaneous   Inject 50 mg into the skin once a week. Friday         . omeprazole (PRILOSEC) 20 MG capsule   Oral   Take 20 mg by mouth 2 (two) times daily.          Marland Kitchen oxyCODONE-acetaminophen (PERCOCET) 10-325 MG tablet   Oral   Take 1 tablet by mouth every 6 (six)  hours as needed for pain.   30 tablet   0   . QUEtiapine (SEROQUEL) 100 MG tablet   Oral   Take 1 tablet (100 mg total) by  mouth at bedtime.         . traZODone (DESYREL) 50 MG tablet   Oral   Take 50-100 mg by mouth at bedtime as needed for sleep.          . vitamin C (ASCORBIC ACID) 500 MG tablet   Oral   Take 500 mg by mouth daily.         . zoledronic acid (RECLAST) 5 MG/100ML SOLN injection   Intravenous   Inject 5 mg into the vein See admin instructions. *uses once a year*            Allergies Gold-containing drug products; Adhesive; Dilaudid; and Pravastatin   Family History  Problem Relation Age of Onset  . CAD Father   . CAD Mother   . Hypertension Mother   . Peripheral vascular disease Mother   . Breast cancer Maternal Aunt 83    Social History Social History  Substance Use Topics  . Smoking status: Former Smoker -- 1.00 packs/day for 53 years    Types: Cigarettes  . Smokeless tobacco: Never Used  . Alcohol Use: No    Review of Systems  Constitutional:   No fever or chills. No weight changes Eyes:   No vision changes.  ENT:   No sore throat. No rhinorrhea. Cardiovascular:   No chest pain. Respiratory:   No dyspnea or cough. Gastrointestinal:   Negative for abdominal pain, vomiting and diarrhea.  . Genitourinary:   Negative for dysuria or difficulty urinating. Musculoskeletal:   Negative for focal pain or swelling Skin:   Negative for rash. Neurological:   Negative for headaches, focal weakness or numbness. Psychiatric: Positive visual hallucinations and agitation.  10-point ROS otherwise negative.  ____________________________________________   PHYSICAL EXAM:  VITAL SIGNS: ED Triage Vitals  Enc Vitals Group     BP 10/19/15 1333 158/74 mmHg     Pulse Rate 10/19/15 1333 71     Resp 10/19/15 1333 18     Temp 10/19/15 1333 98.4 F (36.9 C)     Temp Source 10/19/15 1333 Oral     SpO2 10/19/15 1333 100 %     Weight --       Height 10/19/15 1333 5\' 3"  (1.6 m)     Head Cir --      Peak Flow --      Pain Score 10/19/15 1334 8     Pain Loc --      Pain Edu? --      Excl. in GC? --     Vital signs reviewed, nursing assessments reviewed.   Constitutional:   Alert and oriented to person and place. Well appearing and in no distress. Eyes:   No scleral icterus. No conjunctival pallor. PERRL. EOMI ENT   Head:   Normocephalic and atraumatic.   Nose:   No congestion/rhinnorhea. No septal hematoma   Mouth/Throat:   MMM, no pharyngeal erythema. No peritonsillar mass.    Neck:   No stridor. No SubQ emphysema. No meningismus. Hematological/Lymphatic/Immunilogical:   No cervical lymphadenopathy. Cardiovascular:   RRR heart rate 90. Symmetric bilateral radial and DP pulses.  No murmurs.  Respiratory:   Normal respiratory effort without tachypnea nor retractions. Breath sounds are clear and equal bilaterally. No wheezes/rales/rhonchi. Gastrointestinal:   Soft and nontender. Non distended. There is no CVA tenderness.  No rebound, rigidity, or guarding. Genitourinary:   deferred Musculoskeletal:   Nontender with normal range of motion in all extremities. No joint effusions.  No lower extremity  tenderness.  No edema. Neurologic:   Normal speech and language.  CN 2-10 normal. Motor grossly intact. No gross focal neurologic deficits are appreciated.  Skin:    Skin is warm, dry and intact. No rash noted.  No petechiae, purpura, or bullae. Psychiatric:  Agitated, hostile affect.  ____________________________________________    LABS (pertinent positives/negatives) (all labs ordered are listed, but only abnormal results are displayed) Labs Reviewed  COMPREHENSIVE METABOLIC PANEL - Abnormal; Notable for the following:    Sodium 132 (*)    Potassium 3.0 (*)    CO2 19 (*)    Glucose, Bld 124 (*)    Calcium 8.2 (*)    Albumin 3.3 (*)    All other components within normal limits  CBC WITH DIFFERENTIAL/PLATELET  - Abnormal; Notable for the following:    RBC 3.49 (*)    Hemoglobin 11.1 (*)    HCT 34.1 (*)    RDW 17.7 (*)    Platelets 535 (*)    Monocytes Absolute 1.4 (*)    All other components within normal limits  URINALYSIS COMPLETEWITH MICROSCOPIC ([facility] ONLY) - Abnormal; Notable for the following:    Color, Urine AMBER (*)    APPearance HAZY (*)    Protein, ur 30 (*)    Leukocytes, UA 1+ (*)    Squamous Epithelial / LPF 0-5 (*)    All other components within normal limits  TROPONIN I   ____________________________________________   EKG  Interpreted by me Sinus tachycardia rate 123. Frequent PACs. Normal axis. Normal QRS ST segments and T waves.  ____________________________________________    RADIOLOGY  Chest x-ray unremarkable CT head unremarkable  ____________________________________________   PROCEDURES   ____________________________________________   INITIAL IMPRESSION / ASSESSMENT AND PLAN / ED COURSE  Pertinent labs & imaging results that were available during my care of the patient were reviewed by me and considered in my medical decision making (see chart for details).  Patient presents with confusion and agitation and nonlinear thought process. Also apparently having visual hallucinations. She sustained multiple falls and has multiple injuries with skin tears and an ecchymoses on her body. She is not safe at home and the family is not comfortable trying to take care of her in this condition. I discussed the case with Dr. Toni Amend of psychiatry after he evaluated her in the ER and it does seem that she needs to be started on some antipsychotic medications but due to her frequent falls and being dependent with a walker, that in her current condition, she sometimes forgets to use, she will be at an even higher risk of falling when starting new medication. We therefore agreed that a geropsychiatric referral is appropriate for this patient so that her psychiatric  condition can be stabilized in a controlled setting.  Patient will be transferred to the acute area of the emergency department for psychiatry hold pending referral. She is medically stable at this time.   ____________________________________________   FINAL CLINICAL IMPRESSION(S) / ED DIAGNOSES  Final diagnoses:  Acute psychosis      Sharman Cheek, MD 10/19/15 1734

## 2015-10-19 NOTE — ED Notes (Signed)
Family reports several recent uti's , states she has been confused and dizzy.  Pt fell yesterday.

## 2015-10-19 NOTE — ED Notes (Signed)
Pt refused snack 

## 2015-10-19 NOTE — BH Assessment (Signed)
Assessment Note  Michelle Cabrera is an 70 y.o. female. Who presents to the ED accompanied by her family with concerns about worsening confusion and paranoia. Per pts doctor and medical staff pt became irate and extremely upset when informed that she was being involuntarily committed. Pt has been observed by TTS screaming and yelling at staff uncontrollably. Pt continues to be uncooperative, as a Statistician was unable to conduct a thorough assessment. The following information has been obtained by reviewing the pts medical record and consulting with pts care team.  Pt was seen in the ED recently and has a history of multiple medical issues. Pt experienced a recent fall while walking from the mailbox and has had complaints  of pain to right shoulder,arm and leg. Pt has recently been discharged from a rehabilitation center and returned home to live with her husband. The pts family is concerned as she has become increasingly paranoid and agitated. Pts family state that her sx have worsened and apparently become more bizarre. The pts family reports that they believe that she is experiencing both auditory or visual hallucinations at this time. Pt report have thoughts of not wanting to live with out intent of actually harming herself. Pt will be referred to Geriatric Inpatient when medically clear.      Diagnosis: Psychosis   Past Medical History:  Past Medical History  Diagnosis Date  . Hypertension   . Closed compression fracture of L1 lumbar vertebra (HCC) 11/2012  . COPD (chronic obstructive pulmonary disease) (HCC)   . Anxiety   . Depression   . GERD (gastroesophageal reflux disease)   . Rheumatoid arthritis (HCC)   . DDD (degenerative disc disease)   . Chronic lower back pain   . Collagen vascular disease [facility])     Past Surgical History  Procedure Laterality Date  . Lumbar disc surgery       X 3  . Kyphoplasty N/A 01/04/2013    Procedure: L1 Kyphoplasty;  Surgeon: Hewitt Shorts, MD;   Location: [facility];  Service: Neurosurgery;  Laterality: N/A;  L1 Kyphoplasty  . Tubal ligation    . Foot surgery    . Wrist fusion Right     30 yrs ago  . Kyphoplasty N/A 02/07/2014    Procedure: THORACIC SIX KYPHOPLASTY;  Surgeon: Hewitt Shorts, MD;  Location: [facility];  Service: Neurosurgery;  Laterality: N/A;  T6 Kyphoplasty   . Hand tendon surgery Right 09/15/2014    "d/t RA"  . Tonsillectomy    . Posterior lumbar fusion      "got screws in"  . Back surgery    . Cataract extraction w/ intraocular lens  implant, bilateral Bilateral   . Finger arthroplasty Right 09/15/2014    Procedure: RIGHT MIDDLE FINGER, RING FINGER, SMALL FINGER EXTENSOR DIGITORUM COMMUMIS STABILIZATION WITH RIGHT MIDDLE FINGER MCP ARTHROPLASTY, POSSIBLE RING FINGER AND SMALL FINGER MCP ARTHROPLASTY;  Surgeon: Dominica Severin, MD;  Location: [facility] OR;  Service: Orthopedics;  Laterality: Right;  . Breast biopsy Left     negative    Family History:  Family History  Problem Relation Age of Onset  . CAD Father   . CAD Mother   . Hypertension Mother   . Peripheral vascular disease Mother   . Breast cancer Maternal Aunt 50    Social History:  reports that she has quit smoking. Her smoking use included Cigarettes. She has a 53 pack-year smoking history. She has never used smokeless tobacco. She reports that  she does not drink alcohol or use illicit drugs.  Additional Social History:  Alcohol / Drug Use Pain Medications: See PTA Prescriptions: See PTA Over the Counter: See PTA History of alcohol / drug use?: No history of alcohol / drug abuse Longest period of sobriety (when/how long):  (N/A) Negative Consequences of Use:  (N/A) Withdrawal Symptoms:  (N/A)  CIWA: CIWA-Ar BP: (!) 158/74 mmHg Pulse Rate: 71 COWS:    Allergies:  Allergies  Allergen Reactions  . Gold-Containing Drug Products Anaphylaxis  . Adhesive [Tape]     Tears skin  . Dilaudid [Hydromorphone Hcl]     Made tongue swell  .  Pravastatin     Arms and legs hurt     Home Medications:  (Not in a hospital admission)  OB/GYN Status:  No LMP recorded. Patient is postmenopausal.  General Assessment Data Location of Assessment: [facility] ED TTS Assessment: In system Is this a Tele or Face-to-Face Assessment?: Face-to-Face Is this an Initial Assessment or a Re-assessment for this encounter?: Initial Assessment Marital status: Married Is patient pregnant?: No Pregnancy Status: No Admission Status: Involuntary Is patient capable of signing voluntary admission?: No Referral Source: Self/Family/Friend Insurance type: bcbs  Medical Screening Exam [facility] Walk-in ONLY) Medical Exam completed: No Reason for MSE not completed: Other: (In Progress)  Crisis Care Plan Legal Guardian: Other: (None Reproted) Name of Psychiatrist: None Reported  Name of Therapist: None Reported  Education Status Is patient currently in school?: No Current Grade: N/A Highest grade of school patient has completed: Unknown  Name of school: N/A Contact person: N/A  Risk to self with the past 6 months Suicidal Ideation: Yes-Currently Present Has patient been a risk to self within the past 6 months prior to admission? : Yes Suicidal Intent: No Has patient had any suicidal intent within the past 6 months prior to admission? : No Is patient at risk for suicide?: Yes Suicidal Plan?: No Has patient had any suicidal plan within the past 6 months prior to admission? : No Access to Means: No What has been your use of drugs/alcohol within the last 12 months?: No Previous Attempts/Gestures: No How many times?: 0 Other Self Harm Risks: Unknown  Triggers for Past Attempts: Unknown Intentional Self Injurious Behavior: None Family Suicide History: No Recent stressful life event(s): Other (Comment) (Hallucinations ) Persecutory voices/beliefs?: Yes Depression:  (UTA) Depression Symptoms:  (UTA) Substance abuse history and/or treatment for  substance abuse?: No Suicide prevention information given to non-admitted patients: Not applicable  Risk to Others within the past 6 months Homicidal Ideation: No Does patient have any lifetime risk of violence toward others beyond the six months prior to admission? : No Thoughts of Harm to Others: No Current Homicidal Intent: No Current Homicidal Plan: No Access to Homicidal Means: No Identified Victim: N/A History of harm to others?: No Assessment of Violence: On admission Violent Behavior Description: Pt upset and aggressive with staff Does patient have access to weapons?:  (UTA) Criminal Charges Pending?:  (UTA) Does patient have a court date:  (UTA) Is patient on probation?: Unknown  Psychosis Hallucinations: Auditory, Visual Delusions: Persecutory  Mental Status Report Appearance/Hygiene: Unable to Assess Eye Contact: Unable to Assess Motor Activity: Unable to assess Speech: Unable to assess Level of Consciousness: Unable to assess Mood: Other (Comment) (UTA) Affect: Unable to Assess Anxiety Level:  (UTA) Thought Processes: Unable to Assess Judgement: Unable to Assess Orientation: Unable to assess Obsessive Compulsive Thoughts/Behaviors: Unable to Assess  Cognitive Functioning Concentration: Unable to Assess Memory:  Unable to Assess IQ:  (UTA) Insight: Unable to Assess Impulse Control: Unable to Assess Appetite:  (UTA) Sleep: Unable to Assess Total Hours of Sleep:  (UTA) Vegetative Symptoms: Unable to Assess  ADLScreening [facility] Assessment Services) Patient's cognitive ability adequate to safely complete daily activities?:  (UTA) Patient able to express need for assistance with ADLs?:  (UTA) Independently performs ADLs?:  (UTA)  Prior Inpatient Therapy Prior Inpatient Therapy: No Prior Therapy Dates: N/A Prior Therapy Facilty/Provider(s): N/A Reason for Treatment: N/A  Prior Outpatient Therapy Prior Outpatient Therapy: No Prior Therapy Dates:  N/A Prior Therapy Facilty/Provider(s): N/A Reason for Treatment: N/A Does patient have an ACCT team?: Unknown Does patient have Intensive In-House Services?  : Unknown Does patient have [facility] services? : Unknown Does patient have P4CC services?: Unknown  ADL Screening (condition at time of admission) Patient's cognitive ability adequate to safely complete daily activities?:  (UTA) Patient able to express need for assistance with ADLs?:  (UTA) Independently performs ADLs?:  (UTA)       Abuse/Neglect Assessment (Assessment to be complete while patient is alone) Physical Abuse:  (UTA) Verbal Abuse:  (UTA) Sexual Abuse:  (UTA) Exploitation of patient/patient's resources:  (UTA) Self-Neglect:  (UTA) Possible abuse reported to::  (UTA) Values / Beliefs Cultural Requests During Hospitalization:  (UTA) Spiritual Requests During Hospitalization:  (UTA) Consults Spiritual Care Consult Needed:  (UTA) Social Work Consult Needed:  Industrial/product designer) Merchant navy officer (For Healthcare) Does patient have an advance directive?: No Would patient like information on creating an advanced directive?: Yes English as a second language teacher given    Additional Information 1:1 In Past 12 Months?: No CIRT Risk: No Elopement Risk: No Does patient have medical clearance?: No     Disposition:  Disposition Initial Assessment Completed for this Encounter: Yes Disposition of Patient: Inpatient treatment program, [facility] to [facility] )  On Site Evaluation by:   Reviewed with Physician:    Asa Saunas 10/19/2015 6:54 PM

## 2015-10-20 LAB — URINALYSIS COMPLETE WITH MICROSCOPIC (ARMC ONLY)
BILIRUBIN URINE: NEGATIVE
Bacteria, UA: NONE SEEN
Glucose, UA: NEGATIVE mg/dL
Hgb urine dipstick: NEGATIVE
Leukocytes, UA: NEGATIVE
Nitrite: NEGATIVE
PH: 6 (ref 5.0–8.0)
PROTEIN: NEGATIVE mg/dL
Specific Gravity, Urine: 1.013 (ref 1.005–1.030)

## 2015-10-20 LAB — GLUCOSE, CAPILLARY: GLUCOSE-CAPILLARY: 132 mg/dL — AB (ref 65–99)

## 2015-10-20 MED ORDER — OXYCODONE-ACETAMINOPHEN 5-325 MG PO TABS
2.0000 | ORAL_TABLET | Freq: Once | ORAL | Status: AC
  Administered 2015-10-20: 2 via ORAL
  Filled 2015-10-20: qty 2

## 2015-10-20 MED ORDER — NON FORMULARY: 50.0000 mg | Status: DC

## 2015-10-20 MED ORDER — POTASSIUM CHLORIDE ER 10 MEQ PO TBCR: 10.0000 meq | EXTENDED_RELEASE_TABLET | Freq: Every day | ORAL | Status: DC

## 2015-10-20 MED ORDER — POTASSIUM CHLORIDE CRYS ER 20 MEQ PO TBCR
40.0000 meq | EXTENDED_RELEASE_TABLET | Freq: Once | ORAL | Status: AC
  Administered 2015-10-20: 40 meq via ORAL
  Filled 2015-10-20: qty 2

## 2015-10-20 MED ORDER — ETANERCEPT 50 MG/ML ~~LOC~~ SOSY
50.0000 mg | PREFILLED_SYRINGE | SUBCUTANEOUS | Status: DC
  Administered 2015-10-20: 50 mg via SUBCUTANEOUS
  Filled 2015-10-20 (×2): qty 0.98

## 2015-10-20 NOTE — ED Notes (Signed)
Am meds administered as ordered - delayed due to pt has been sleeping

## 2015-10-20 NOTE — ED Notes (Signed)
Pt with a phone call from her daughter  - phone provided

## 2015-10-20 NOTE — ED Provider Notes (Signed)
-----------------------------------------   6:02 PM on 10/20/2015 -----------------------------------------   Blood pressure 125/60, pulse 104, temperature 98.6 F (37 C), temperature source Oral, resp. rate 18, height 5\' 3"  (1.6 m), SpO2 100 %.  The patient had no acute events since last update.  Calm and cooperative at this time. Patient accepted to [facility] for inpatient geropsychiatric care. Patient receiving her Enbrel auto injection now prior to transfer.   [facility], MD 10/20/15 [phone]

## 2015-10-20 NOTE — Progress Notes (Signed)
LCSW and RN consulted LCSW recalled Sandre Kitty and spoke to [place], she requested EKG and IVC documents. LCSW faxed them over to [facility] and consulted with TTS. Will await a call back from [place].  [employer] LCSW [phone]

## 2015-10-20 NOTE — ED Notes (Signed)
Breakfast provided  - meds to be administered as ordered

## 2015-10-20 NOTE — ED Notes (Signed)
BEHAVIORAL HEALTH ROUNDING Patient sleeping: No. Patient alert and oriented: no Behavior appropriate: Yes.  ; If no, describe:  Nutrition and fluids offered: Yes  Toileting and hygiene offered: Yes  Sitter present: ED Rover Present Law enforcement present: Yes

## 2015-10-20 NOTE — Progress Notes (Signed)
LCSW met with entire family . Daughter, father, son, grand daughter and patients sister. Family was supported after they were conflicted about taking her home. Concerns was that she would be placed hours away and they couldn't be there to support her. In consultation with Dr Weber Cooks patient is IVC and in the patients best interest that she remain at hospital and be placed at a geri psychiatric facility. Family supported and several resources discussed. LCSW will continue to call to see about bed options. Michelle Cabrera

## 2015-10-20 NOTE — BH Assessment (Signed)
Patient has been accepted to [facility].  Accepting physician is Dr. Annamaria Helling.  Call report to [phone].  Representative was [place].  ER Staff is aware of it Misty Stanley & [place], ER Sect.; Dr. Scotty Court, ER MD & Viviann Spare, Patient's Nurse)    Patient's Family/Support System Cristal Deer, Son) have been updated by Social Work (Claudine).

## 2015-10-20 NOTE — ED Notes (Signed)
BEHAVIORAL HEALTH ROUNDING Patient sleeping: Yes.   Patient alert and oriented: eyes closed  Appears asleep Behavior appropriate: Yes.  ; If no, describe:  Nutrition and fluids offered: Yes  Toileting and hygiene offered: sleeping Sitter present: q 15 minute observations and security monitoring Law enforcement present: yes  ODS 

## 2015-10-20 NOTE — Progress Notes (Signed)
LCSW spoke with daughter and will review and do a complete assessment at noon with spouse and family members. Family very concerned she will not be placed adequately and concerned she will be shipped away and reports she is not psychiatric , she is hallucinating though and delusional ( imagining sister taking dresses) ( she is in [place]) LCSW started [place] and assessment  Questions. [employer] LCSW [phone]

## 2015-10-20 NOTE — Progress Notes (Signed)
LCSW called son Ernestina Joe and he will let his family know Mom has been accepted to [place]. [facility] hospital, the address and phone number and visiting hours were relayed to him. Patients family requesting that patient be transported by EMS.  Angelina Neece LCSW

## 2015-10-20 NOTE — ED Notes (Signed)
Pt awake - sitting up in bed with c/o pain to her bilateral legs  - Dianna and I assisted her to the BR  Repeat UA sent to lab  Ashley given along with clean clothes - hillrom hospital bed provided at this time   Pt with scattered ecchymosis on all extremities  Large hematoma to center of chest  Pt reports unknown reason of how it got there   Skin tear x2 to left knee  Cleansed and covered with hydrocolloid  Skin tear to right elbow also assessed - healing  She has a dressing over right forearm  Pt reports  "It is bad under there - do not touch it today."

## 2015-10-20 NOTE — ED Notes (Signed)
BEHAVIORAL HEALTH ROUNDING Patient sleeping: No. Patient alert and oriented: yes Behavior appropriate: Yes.  ; If no, describe:  Nutrition and fluids offered: yes Toileting and hygiene offered: Yes  Sitter present: q15 minute observations and security monitoring Law enforcement present: Yes  ODS  Pt repositioned -  toileting offered

## 2015-10-20 NOTE — ED Notes (Signed)
ED BHU PLACEMENT JUSTIFICATION Is the patient under IVC or is there intent for IVC: Yes.   Is the patient medically cleared: Yes.   Is there vacancy in the ED BHU: Yes.   Is the population mix appropriate for patient:  No chronic oxygen    Is the patient awaiting placement in inpatient or outpatient setting: No. Has the patient had a psychiatric consult:  Consult pending Survey of unit performed for contraband, proper placement and condition of furniture, tampering with fixtures in bathroom, shower, and each patient room: Yes.  ; Findings:  APPEARANCE/BEHAVIOR Calm and cooperative NEURO ASSESSMENT Orientation: oriented to self  Denies pain Hallucinations: No.None noted (Hallucinations) Speech: Normal Gait: normal - one steady assist  RESPIRATORY ASSESSMENT even unlabored respirations  CARDIOVASCULAR ASSESSMENT Pulses equal  regular rate  Skin warm and dry GASTROINTESTINAL ASSESSMENT no GI complaint EXTREMITIES Full ROM PLAN OF CARE Provide calm/safe environment. Vital signs assessed twice daily. ED BHU Assessment once each 12-hour shift. Collaborate with intake RN daily or as condition indicates. Assure the ED provider has rounded once each shift. Provide and encourage hygiene. Provide redirection as needed. Assess for escalating behavior; address immediately and inform ED provider.  Assess family dynamic and appropriateness for visitation as needed: Yes.  ; If necessary, describe findings:  Educate the patient/family about BHU procedures/visitation: Yes.  ; If necessary, describe findings:

## 2015-10-20 NOTE — ED Notes (Signed)
BEHAVIORAL HEALTH ROUNDING Patient sleeping: No. Patient alert and oriented: yes Behavior appropriate: Yes.  ; If no, describe:  Nutrition and fluids offered: yes Toileting and hygiene offered: Yes  Sitter present: q15 minute observations and security  monitoring Law enforcement present: Yes  ODS  

## 2015-10-20 NOTE — Consult Note (Signed)
[place] Psychiatry Consult   Reason for Consult:  Consult for this 70 year old woman brought in by her family with concerns about worsening confusion and paranoia Referring Physician:  Joni Fears Patient Identification: Michelle Cabrera MRN:  [id number] Principal Diagnosis: Psychosis in elderly Diagnosis:   Patient Active Problem List   Diagnosis Date Noted  . 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 Pressure ulcer [L89.90] 09/08/2015  . New onset type 2 diabetes mellitus ([place]) [E11.9] 09/06/2015  . Altered mental status [R41.82] 09/06/2015  . COPD exacerbation ([place]) [J44.1] 07/26/2015  . Arthritis associated with another disorder [M19.90] 09/15/2014  . Wedge compression fracture of T6 vertebra ([place]) [S22.050A] 02/07/2014  . Intractable low back pain [M54.5] 12/27/2012  . DDD (degenerative disc disease) [IMO0002] 12/27/2012  . Rheumatoid arthritis ([place]) [M06.9] 12/27/2012  . Hypertension [I10] 12/27/2012    Total Time spent with patient: 1 hour  Subjective:   Michelle Cabrera is a 70 y.o. female patient admitted with "I'm just not recovering".  Follow-up as of Friday the 24th. Patient evaluated. Case reviewed with social work. Labs and vitals reviewed. 70 year old woman with psychotic symptoms including paranoia hallucinations and agitation to the point of it being very difficult for her to be managed at home. Patient has no new complaints. She continues to have poor insight and does not understand why she is still in the hospital. Continues to have intermittent expressions of paranoia but has not been violent or aggressive. Family has been in close contact with the treatment team. They are very anxious about the treatment plan but he continues to be clear that they have not been able to manage the patient at home and that she is at high risk for falls and injury.  HPI:  Patient interviewed. Spoke with her family both in her presence and out in the hallway. Chart reviewed. I  spoke to her also when she was in the hospital a month or so ago. Labs reviewed and case discussed with emergency room physician. 70 year old woman with a history of rheumatoid arthritis high blood pressure COPD chronic back pain. She is brought in by her family because her paranoia and agitation have been getting worse. She had been at a rehabilitation center for some time but now has gone back home to live with her husband. Now that she is back her paranoia is even worse. She is up all night acting like she is seeing things and talking to things. In the morning she is accusing her husband of all manner of bizarre things and appears to be having visual hallucinations. Mood is labile and frequently tearful. On interviewed she tells me that she is feeling very upset. She tells me that some time she wishes that she would die although she has no thought of trying to hurt her self. She complains most bitterly of the pain in her back but second most of the terrible things that she says that her husband is doing. The nature of them is pretty bizarre and unbelievable and other family members also vouch that none of those things are true. Patient was having some confusion and delirium when she was in the hospital not too long ago and was started on Seroquel and is on 100 mg at night. Despite this she is still not sleeping well and is having psychotic symptoms. Furthermore she is getting more unsteady on her feet and has been having more falls.  Social history: Lives third husband. Sister is also closely involved as is an adult  son. The husband appears to be very appropriately concerned.  Medical history: Multiple chronic serious medical problems including chronic degenerative disc disease with chronic back pain that has been treated with opiates for quite a while. She has rheumatoid arthritis high blood pressure COPD as well also now has developed diabetes. She is on multiple medications.  Substance abuse history:  Very distant history possibly of some substance abuse but nothing recent there is no apparent elevated of any current miss use of medicine.  Past Psychiatric History: Patient was seen here in the hospital not long ago and at that time was delirious and confused although it got better during the hospitalization. Prior to that she has some distant history of what sounds like mild to moderate depression no past hospitalization no history of suicide attempts. No past history of psychotic disorder.  Risk to Self: Suicidal Ideation: Yes-Currently Present Suicidal Intent: No Is patient at risk for suicide?: Yes Suicidal Plan?: No Access to Means: No What has been your use of drugs/alcohol within the last 12 months?: No How many times?: 0 Other Self Harm Risks: Unknown  Triggers for Past Attempts: Unknown Intentional Self Injurious Behavior: None Risk to Others: Homicidal Ideation: No Thoughts of Harm to Others: No Current Homicidal Intent: No Current Homicidal Plan: No Access to Homicidal Means: No Identified Victim: N/A History of harm to others?: No Assessment of Violence: On admission Violent Behavior Description: Pt upset and aggressive with staff Does patient have access to weapons?:  (UTA) Criminal Charges Pending?:  (UTA) Does patient have a court date:  (UTA) Prior Inpatient Therapy: Prior Inpatient Therapy: No Prior Therapy Dates: N/A Prior Therapy Facilty/Provider(s): N/A Reason for Treatment: N/A Prior Outpatient Therapy: Prior Outpatient Therapy: No Prior Therapy Dates: N/A Prior Therapy Facilty/Provider(s): N/A Reason for Treatment: N/A Does patient have an ACCT team?: Unknown Does patient have Intensive In-House Services?  : Unknown Does patient have [facility] services? : Unknown Does patient have P4CC services?: Unknown  Past Medical History:  Past Medical History  Diagnosis Date  . Hypertension   . Closed compression fracture of L1 lumbar vertebra ([place]) 11/2012  .  COPD (chronic obstructive pulmonary disease) ([place])   . Anxiety   . Depression   . GERD (gastroesophageal reflux disease)   . Rheumatoid arthritis ([place])   . DDD (degenerative disc disease)   . Chronic lower back pain   . Collagen vascular disease [facility])     Past Surgical History  Procedure Laterality Date  . Lumbar disc surgery       X 3  . Kyphoplasty N/A 01/04/2013    Procedure: L1 Kyphoplasty;  Surgeon: Hosie Spangle, MD;  Location: [place] NEURO ORS;  Service: Neurosurgery;  Laterality: N/A;  L1 Kyphoplasty  . Tubal ligation    . Foot surgery    . Wrist fusion Right     30 yrs ago  . Kyphoplasty N/A 02/07/2014    Procedure: THORACIC SIX KYPHOPLASTY;  Surgeon: Hosie Spangle, MD;  Location: [place] NEURO ORS;  Service: Neurosurgery;  Laterality: N/A;  T6 Kyphoplasty   . Hand tendon surgery Right 09/15/2014    "d/t RA"  . Tonsillectomy    . Posterior lumbar fusion      "got screws in"  . Back surgery    . Cataract extraction w/ intraocular lens  implant, bilateral Bilateral   . Finger arthroplasty Right 09/15/2014    Procedure: RIGHT MIDDLE FINGER, RING FINGER, SMALL FINGER EXTENSOR DIGITORUM COMMUMIS STABILIZATION WITH RIGHT MIDDLE FINGER MCP  ARTHROPLASTY, POSSIBLE RING FINGER AND SMALL FINGER MCP ARTHROPLASTY;  Surgeon: Roseanne Kaufman, MD;  Location: [place];  Service: Orthopedics;  Laterality: Right;  . Breast biopsy Left     negative   Family History:  Family History  Problem Relation Age of Onset  . CAD Father   . CAD Mother   . Hypertension Mother   . Peripheral vascular disease Mother   . Breast cancer Maternal Aunt 63   Family Psychiatric  History: No significant family history of mental health problems or substance abuse Social History:  History  Alcohol Use No     History  Drug Use No    Social History   Social History  . Marital Status: Married    Spouse Name: N/A  . Number of Children: N/A  . Years of Education: N/A   Occupational History  . retired     Social History Main Topics  . Smoking status: Former Smoker -- 1.00 packs/day for 53 years    Types: Cigarettes  . Smokeless tobacco: Never Used  . Alcohol Use: No  . Drug Use: No  . Sexual Activity: Yes   Other Topics Concern  . None   Social History Narrative   Lives with husband at home   Additional Social History:    Allergies:   Allergies  Allergen Reactions  . Gold-Containing Drug Products Anaphylaxis  . Adhesive [Tape]     Tears skin  . Dilaudid [Hydromorphone Hcl]     Made tongue swell  . Pravastatin     Arms and legs hurt     Labs:  Results for orders placed or performed during the hospital encounter of 10/19/15 (from the past 48 hour(s))  Comprehensive metabolic panel     Status: Abnormal   Collection Time: 10/19/15  1:42 PM  Result Value Ref Range   Sodium 132 (L) 135 - 145 mmol/L   Potassium 3.0 (L) 3.5 - 5.1 mmol/L   Chloride 104 101 - 111 mmol/L   CO2 19 (L) 22 - 32 mmol/L   Glucose, Bld 124 (H) 65 - 99 mg/dL   BUN 17 6 - 20 mg/dL   Creatinine, Ser 0.55 0.44 - 1.00 mg/dL   Calcium 8.2 (L) 8.9 - 10.3 mg/dL   Total Protein 6.9 6.5 - 8.1 g/dL   Albumin 3.3 (L) 3.5 - 5.0 g/dL   AST 24 15 - 41 U/L   ALT 15 14 - 54 U/L   Alkaline Phosphatase 110 38 - 126 U/L   Total Bilirubin 0.4 0.3 - 1.2 mg/dL   GFR calc non Af Amer >60 >60 mL/min   GFR calc Af Amer >60 >60 mL/min    Comment: (NOTE) The eGFR has been calculated using the CKD EPI equation. This calculation has not been validated in all clinical situations. eGFR's persistently <60 mL/min signify possible Chronic Kidney Disease.    Anion gap 9 5 - 15  CBC with Differential     Status: Abnormal   Collection Time: 10/19/15  1:42 PM  Result Value Ref Range   WBC 10.5 3.6 - 11.0 K/uL   RBC 3.49 (L) 3.80 - 5.20 MIL/uL   Hemoglobin 11.1 (L) 12.0 - 16.0 g/dL   HCT 34.1 (L) 35.0 - 47.0 %   MCV 97.8 80.0 - 100.0 fL   MCH 31.7 26.0 - 34.0 pg   MCHC 32.4 32.0 - 36.0 g/dL   RDW 17.7 (H) 11.5 - 14.5 %    Platelets 535 (H) 150 - 440  K/uL   Neutrophils Relative % 54 %   Neutro Abs 5.7 1.4 - 6.5 K/uL   Lymphocytes Relative 29 %   Lymphs Abs 3.1 1.0 - 3.6 K/uL   Monocytes Relative 13 %   Monocytes Absolute 1.4 (H) 0.2 - 0.9 K/uL   Eosinophils Relative 3 %   Eosinophils Absolute 0.3 0 - 0.7 K/uL   Basophils Relative 1 %   Basophils Absolute 0.1 0 - 0.1 K/uL  Urinalysis complete, with microscopic ([facility] only)     Status: Abnormal   Collection Time: 10/19/15  1:42 PM  Result Value Ref Range   Color, Urine AMBER (A) YELLOW   APPearance HAZY (A) CLEAR   Glucose, UA NEGATIVE NEGATIVE mg/dL   Bilirubin Urine NEGATIVE NEGATIVE   Ketones, ur NEGATIVE NEGATIVE mg/dL   Specific Gravity, Urine 1.027 1.005 - 1.030   Hgb urine dipstick NEGATIVE NEGATIVE   pH 5.0 5.0 - 8.0   Protein, ur 30 (A) NEGATIVE mg/dL   Nitrite NEGATIVE NEGATIVE   Leukocytes, UA 1+ (A) NEGATIVE   RBC / HPF 0-5 0 - 5 RBC/hpf   WBC, UA 0-5 0 - 5 WBC/hpf   Bacteria, UA NONE SEEN NONE SEEN   Squamous Epithelial / LPF 0-5 (A) NONE SEEN   Trans Epithel, UA 6    Mucous PRESENT    Hyaline Casts, UA PRESENT   Troponin I     Status: None   Collection Time: 10/19/15  1:42 PM  Result Value Ref Range   Troponin I <0.03 <0.031 ng/mL    Comment:        NO INDICATION OF MYOCARDIAL INJURY.   Urinalysis complete, with microscopic     Status: Abnormal   Collection Time: 10/20/15  7:58 AM  Result Value Ref Range   Color, Urine YELLOW (A) YELLOW   APPearance CLEAR (A) CLEAR   Glucose, UA NEGATIVE NEGATIVE mg/dL   Bilirubin Urine NEGATIVE NEGATIVE   Ketones, ur TRACE (A) NEGATIVE mg/dL   Specific Gravity, Urine 1.013 1.005 - 1.030   Hgb urine dipstick NEGATIVE NEGATIVE   pH 6.0 5.0 - 8.0   Protein, ur NEGATIVE NEGATIVE mg/dL   Nitrite NEGATIVE NEGATIVE   Leukocytes, UA NEGATIVE NEGATIVE   RBC / HPF 0-5 0 - 5 RBC/hpf   WBC, UA 0-5 0 - 5 WBC/hpf   Bacteria, UA NONE SEEN NONE SEEN   Squamous Epithelial / LPF 0-5 (A) NONE SEEN    Mucous PRESENT     Current Facility-Administered Medications  Medication Dose Route Frequency Provider Last Rate Last Dose  . albuterol (PROVENTIL HFA;VENTOLIN HFA) 108 (90 Base) MCG/ACT inhaler 2 puff  2 puff Inhalation Q6H PRN Carrie Mew, MD      . aspirin EC tablet 81 mg  81 mg Oral Daily Carrie Mew, MD   81 mg at 10/20/15 1418  . Ben Gay Arthritis Formula 2-24 % CREA 1 application  1 application Apply externally BID PRN Carrie Mew, MD      . calcium-vitamin D (OSCAL WITH D) 500-200 MG-UNIT per tablet 2 tablet  2 tablet Oral BID Carrie Mew, MD   2 tablet at 10/20/15 1420  . DULoxetine (CYMBALTA) DR capsule 30 mg  30 mg Oral QHS Carrie Mew, MD   30 mg at 10/20/15 0039  . DULoxetine (CYMBALTA) DR capsule 60 mg  60 mg Oral q morning - 10a Carrie Mew, MD   60 mg at 10/20/15 1419  . etanercept (ENBREL) 50 MG/ML injection 50 mg  50 mg Subcutaneous Q Fri John T Clapacs, MD      . fluticasone furoate-vilanterol (BREO ELLIPTA) 100-25 MCG/INH 1 puff  1 puff Inhalation q morning - 10a Carrie Mew, MD   1 puff at 10/20/15 1421  . gabapentin (NEURONTIN) capsule 600 mg  600 mg Oral TID PRN Carrie Mew, MD   600 mg at 10/20/15 0751  . ibuprofen (ADVIL,MOTRIN) tablet 400 mg  400 mg Oral Q6H PRN Carrie Mew, MD   400 mg at 10/20/15 0752  . ipratropium-albuterol (DUONEB) 0.5-2.5 (3) MG/3ML nebulizer solution 3 mL  3 mL Nebulization Q6H PRN Carrie Mew, MD      . NON FORMULARY 50 mg  50 mg Subcutaneous Weekly Gonzella Lex, MD      . pantoprazole (PROTONIX) EC tablet 40 mg  40 mg Oral Daily Carrie Mew, MD   40 mg at 10/20/15 1419  . QUEtiapine (SEROQUEL) tablet 100 mg  100 mg Oral QHS Gonzella Lex, MD   100 mg at 10/19/15 2015  . risperiDONE (RISPERDAL) tablet 0.25 mg  0.25 mg Oral BID Gonzella Lex, MD   0.25 mg at 10/20/15 0752  . vitamin C (ASCORBIC ACID) tablet 500 mg  500 mg Oral Daily Carrie Mew, MD   500 mg at 10/20/15 1419    Current Outpatient Prescriptions  Medication Sig Dispense Refill  . albuterol (PROVENTIL HFA;VENTOLIN HFA) 108 (90 Base) MCG/ACT inhaler Inhale 2 puffs into the lungs every 6 (six) hours as needed for wheezing or shortness of breath.    Marland Kitchen aspirin (ASPIRIN EC) 81 MG EC tablet Take 81 mg by mouth daily. Swallow whole.    . Calcium Carbonate-Vit D-Min (CALCIUM-VITAMIN D-MINERALS) 600-400 MG-UNIT CHEW Chew 2 tablets by mouth 2 (two) times daily.     . divalproex (DEPAKOTE SPRINKLE) 125 MG capsule Take 125 mg by mouth 2 (two) times daily.    . DULoxetine (CYMBALTA) 30 MG capsule Take 30 mg by mouth at bedtime.     . DULoxetine (CYMBALTA) 60 MG capsule Take 60 mg by mouth every morning.    . etanercept (ENBREL) 50 MG/ML injection Inject 50 mg into the skin once a week. Friday    . Fluticasone Furoate-Vilanterol (BREO ELLIPTA) 100-25 MCG/INH AEPB Inhale 1 puff into the lungs every morning.    . gabapentin (NEURONTIN) 300 MG capsule Take 600 mg by mouth 3 (three) times daily as needed (leg pain).     Marland Kitchen ibuprofen (ADVIL,MOTRIN) 200 MG tablet Take 400 mg by mouth every 6 (six) hours as needed for mild pain.    Marland Kitchen insulin aspart (NOVOLOG) 100 UNIT/ML injection Inject 0-5 Units into the skin at bedtime. 10 mL 11  . insulin aspart (NOVOLOG) 100 UNIT/ML injection Inject 0-15 Units into the skin 3 (three) times daily with meals. 10 mL 11  . insulin glargine (LANTUS) 100 UNIT/ML injection Inject 0.1 mLs (10 Units total) into the skin daily. 10 mL 11  . ipratropium-albuterol (DUONEB) 0.5-2.5 (3) MG/3ML SOLN Take 3 mLs by nebulization every 6 (six) hours as needed (for shortness of breath).     . Menthol-Methyl Salicylate (BEN GAY ARTHRITIS FORMULA) 8-30 % CREA Apply 1 application topically 2 (two) times daily as needed (for back pain).     . Methotrexate, PF, 25 MG/0.4ML SOAJ Inject 50 mg into the skin once a week. Friday    . omeprazole (PRILOSEC) 20 MG capsule Take 20 mg by mouth 2 (two) times daily.     Marland Kitchen  oxyCODONE-acetaminophen (  PERCOCET) 10-325 MG tablet Take 1 tablet by mouth every 6 (six) hours as needed for pain. 30 tablet 0  . QUEtiapine (SEROQUEL) 100 MG tablet Take 1 tablet (100 mg total) by mouth at bedtime.    . sulfamethoxazole-trimethoprim (BACTRIM DS,SEPTRA DS) 800-160 MG tablet Take 1 tablet by mouth 2 (two) times daily.    . traZODone (DESYREL) 50 MG tablet Take 50-100 mg by mouth at bedtime as needed for sleep.     . vitamin C (ASCORBIC ACID) 500 MG tablet Take 500 mg by mouth daily.    . zoledronic acid (RECLAST) 5 MG/100ML SOLN injection Inject 5 mg into the vein See admin instructions. *uses once a year*      Musculoskeletal: Strength & Muscle Tone: decreased and atrophy Gait & Station: unsteady Patient leans: Backward  Psychiatric Specialty Exam: Review of Systems  Eyes: Negative.   Respiratory: Negative.   Cardiovascular: Negative.   Gastrointestinal: Negative.   Musculoskeletal: Positive for back pain and neck pain.  Skin: Positive for rash.  Neurological: Positive for weakness.  Psychiatric/Behavioral: Positive for depression, suicidal ideas, hallucinations and memory loss. Negative for substance abuse. The patient is nervous/anxious and has insomnia.     Blood pressure 125/60, pulse 104, temperature 98.6 F (37 C), temperature source Oral, resp. rate 18, height _0  (1.6 m), SpO2 100 %.There is no weight on file to calculate BMI.  General Appearance: Casual  Eye Contact::  Fair  Speech:  Garbled and Slurred  Volume:  Decreased  Mood:  Angry and Dysphoric  Affect:  Depressed and Tearful  Thought Process:  Disorganized and Tangential  Orientation:  Full (Time, Place, and Person)  Thought Content:  Paranoid Ideation  Suicidal Thoughts:  Yes.  without intent/plan  Homicidal Thoughts:  No  Memory:  Immediate;   Good Recent;   Good Remote;   Fair  Judgement:  Impaired  Insight:  Shallow  Psychomotor Activity:  Decreased  Concentration:  Poor  Recall:   Poor  Fund of Knowledge:Fair  Language: Fair  Akathisia:  Negative  Handed:  Right  AIMS (if indicated):     Assets:  Financial Resources/Insurance Housing Social Support  ADL's:  Impaired  Cognition: Impaired,  Mild  Sleep:      Treatment Plan Summary: Daily contact with patient to assess and evaluate symptoms and progress in treatment, Medication management and Plan Low dose of antipsychotic added which appears to be tolerated. Not oversedated. Vital signs stable. We are working on transfer to geriatric psychiatry facility. Social work has multiple contacts going on. Patient is under involuntary commitment which takes some of the burden off of the family who do not feel like they have to be responsible for deciding whether or not to take her home. I think this would be the best way to try and get her stabilized the safest. No change to further plan or alteration of medications here while we wait for transfer.  Disposition: Recommend psychiatric Inpatient admission when medically cleared. Supportive therapy provided about ongoing stressors.  Alethia Berthold, MD 10/20/2015 2:45 PM

## 2015-10-20 NOTE — ED Notes (Signed)
BEHAVIORAL HEALTH ROUNDING Patient sleeping: No. Patient alert and oriented: Alert, but confused Behavior appropriate: Yes.  ; If no, describe:  Nutrition and fluids offered: Yes  Toileting and hygiene offered: Yes  Sitter present: ED Rover Present Law enforcement present: Yes

## 2015-10-20 NOTE — BH Assessment (Signed)
Patient denied at [facility] ([phone]), Lesia Hausen, due to "medical Acuity." Hospital is a "stand alone" facility and unable to provided for her medical needs.

## 2015-10-20 NOTE — ED Notes (Signed)
BEHAVIORAL HEALTH ROUNDING Patient sleeping: No. Patient alert and oriented: alert, but confused Behavior appropriate: No.; If no, describe: pt talking to self Nutrition and fluids offered: Yes  Toileting and hygiene offered: Yes  Sitter present: ED Rover Present Law enforcement present: Yes

## 2015-10-20 NOTE — ED Notes (Signed)
Pt up to bathroom.  Steady gait with one assist.

## 2015-10-20 NOTE — NC FL2 (Signed)
Early MEDICAID FL2 LEVEL OF CARE SCREENING TOOL     IDENTIFICATION  Patient Name: Michelle Cabrera Birthdate: 11-Jan-1946 Sex: female Admission Date (Current Location): 10/19/2015  [facility] and [place] Number:  Chiropodist and Address:  [facility], [address] [place], [place] [address]      Provider Number: [id number]  Attending Physician Name and Address:  No att. providers found  Relative Name and Phone Number:       Current Level of Care: Hospital Recommended Level of Care: Skilled Nursing Facility Prior Approval Number:    Date Approved/Denied:   PASRR Number:    Discharge Plan: SNF    Current Diagnoses: Patient Active Problem List   Diagnosis Date Noted  . 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 Pressure ulcer 09/08/2015  . New onset type 2 diabetes mellitus (HCC) 09/06/2015  . Altered mental status 09/06/2015  . COPD exacerbation (HCC) 07/26/2015  . Arthritis associated with another disorder 09/15/2014  . Wedge compression fracture of T6 vertebra (HCC) 02/07/2014  . Intractable low back pain 12/27/2012  . DDD (degenerative disc disease) 12/27/2012  . Rheumatoid arthritis (HCC) 12/27/2012  . Hypertension 12/27/2012    Orientation RESPIRATION BLADDER Height & Weight     Self, Time, Situation    Continent, Incontinent Weight:   Height:  5\' 3"  (160 cm)  BEHAVIORAL SYMPTOMS/MOOD NEUROLOGICAL BOWEL NUTRITION STATUS      Incontinent Diet  AMBULATORY STATUS COMMUNICATION OF NEEDS Skin   Extensive Assist Verbally Normal                       Personal Care Assistance Level of Assistance  Bathing, Feeding, Dressing Bathing Assistance: Maximum assistance Feeding assistance: Maximum assistance Dressing Assistance: Maximum assistance     Functional Limitations Info  Sight, Hearing, Speech Sight Info: Adequate Hearing Info: Adequate Speech Info: Adequate    SPECIAL CARE FACTORS FREQUENCY  PT (By licensed PT)      PT Frequency: 3x at home              Contractures      Additional Factors Info  Allergies   Allergies Info: Gold containing drug products, adhesive, dilaudin, prevastatin           Current Medications (10/20/2015):  This is the current hospital active medication list Current Facility-Administered Medications  Medication Dose Route Frequency Provider Last Rate Last Dose  . albuterol (PROVENTIL HFA;VENTOLIN HFA) 108 (90 Base) MCG/ACT inhaler 2 puff  2 puff Inhalation Q6H PRN Sharman Cheek, MD      . aspirin EC tablet 81 mg  81 mg Oral Daily Sharman Cheek, MD      . Romeo Apple Gay Arthritis Formula 8-30 % CREA 1 application  1 application Apply externally BID PRN Sharman Cheek, MD      . calcium-vitamin D (OSCAL WITH D) 500-200 MG-UNIT per tablet 2 tablet  2 tablet Oral BID Sharman Cheek, MD      . DULoxetine (CYMBALTA) DR capsule 30 mg  30 mg Oral QHS Sharman Cheek, MD   30 mg at 10/20/15 0039  . DULoxetine (CYMBALTA) DR capsule 60 mg  60 mg Oral q morning - 10a Sharman Cheek, MD      . fluticasone furoate-vilanterol (BREO ELLIPTA) 100-25 MCG/INH 1 puff  1 puff Inhalation q morning - 10a Sharman Cheek, MD      . gabapentin (NEURONTIN) capsule 600 mg  600 mg Oral TID PRN Sharman Cheek, MD  600 mg at 10/20/15 0751  . ibuprofen (ADVIL,MOTRIN) tablet 400 mg  400 mg Oral Q6H PRN Sharman Cheek, MD   400 mg at 10/20/15 0752  . ipratropium-albuterol (DUONEB) 0.5-2.5 (3) MG/3ML nebulizer solution 3 mL  3 mL Nebulization Q6H PRN Sharman Cheek, MD      . pantoprazole (PROTONIX) EC tablet 40 mg  40 mg Oral Daily Sharman Cheek, MD      . QUEtiapine (SEROQUEL) tablet 100 mg  100 mg Oral QHS Audery Amel, MD   100 mg at 10/19/15 2015  . risperiDONE (RISPERDAL) tablet 0.25 mg  0.25 mg Oral BID Audery Amel, MD   0.25 mg at 10/20/15 0752  . vitamin C (ASCORBIC ACID) tablet 500 mg  500 mg Oral Daily Sharman Cheek, MD       Current Outpatient Prescriptions   Medication Sig Dispense Refill  . albuterol (PROVENTIL HFA;VENTOLIN HFA) 108 (90 Base) MCG/ACT inhaler Inhale 2 puffs into the lungs every 6 (six) hours as needed for wheezing or shortness of breath.    Marland Kitchen aspirin (ASPIRIN EC) 81 MG EC tablet Take 81 mg by mouth daily. Swallow whole.    . Calcium Carbonate-Vit D-Min (CALCIUM-VITAMIN D-MINERALS) 600-400 MG-UNIT CHEW Chew 2 tablets by mouth 2 (two) times daily.     . ciprofloxacin (CIPRO) 250 MG tablet Take 1 tablet (250 mg total) by mouth 2 (two) times daily. 10 tablet 0  . DULoxetine (CYMBALTA) 30 MG capsule Take 30 mg by mouth at bedtime.     . DULoxetine (CYMBALTA) 60 MG capsule Take 60 mg by mouth every morning.    . etanercept (ENBREL) 50 MG/ML injection Inject 50 mg into the skin once a week. Friday    . Fluticasone Furoate-Vilanterol (BREO ELLIPTA) 100-25 MCG/INH AEPB Inhale 1 puff into the lungs every morning.    . gabapentin (NEURONTIN) 300 MG capsule Take 600 mg by mouth 3 (three) times daily as needed (leg pain).     Marland Kitchen ibuprofen (ADVIL,MOTRIN) 200 MG tablet Take 400 mg by mouth every 6 (six) hours as needed for mild pain.    Marland Kitchen insulin aspart (NOVOLOG) 100 UNIT/ML injection Inject 0-5 Units into the skin at bedtime. 10 mL 11  . insulin aspart (NOVOLOG) 100 UNIT/ML injection Inject 0-15 Units into the skin 3 (three) times daily with meals. 10 mL 11  . insulin glargine (LANTUS) 100 UNIT/ML injection Inject 0.1 mLs (10 Units total) into the skin daily. 10 mL 11  . ipratropium-albuterol (DUONEB) 0.5-2.5 (3) MG/3ML SOLN Take 3 mLs by nebulization every 6 (six) hours as needed (SOB).     . Menthol-Methyl Salicylate (BEN GAY ARTHRITIS FORMULA) 8-30 % CREA Apply 1 application topically 2 (two) times daily as needed (for back pain).     . Methotrexate, PF, 25 MG/0.4ML SOAJ Inject 50 mg into the skin once a week. Friday    . omeprazole (PRILOSEC) 20 MG capsule Take 20 mg by mouth 2 (two) times daily.     Marland Kitchen oxyCODONE-acetaminophen (PERCOCET) 10-325  MG tablet Take 1 tablet by mouth every 6 (six) hours as needed for pain. 30 tablet 0  . QUEtiapine (SEROQUEL) 100 MG tablet Take 1 tablet (100 mg total) by mouth at bedtime.    . traZODone (DESYREL) 50 MG tablet Take 50-100 mg by mouth at bedtime as needed for sleep.     . vitamin C (ASCORBIC ACID) 500 MG tablet Take 500 mg by mouth daily.    . zoledronic acid (RECLAST) 5 MG/100ML  SOLN injection Inject 5 mg into the vein See admin instructions. *uses once a year*       Discharge Medications: Please see discharge summary for a list of discharge medications.  Relevant Imaging Results:  Relevant Lab Results:   Additional Information SSN [id number]  Cheron Schaumann, [place]

## 2015-10-20 NOTE — Clinical Social Work Note (Signed)
Clinical Social Work Assessment  Patient Details  Name: Michelle Cabrera MRN: [id number] Date of Birth: 1946-03-26  Date of referral:  10/20/15               Reason for consult:  Care Management Concerns, Family Concerns, Discharge Planning                Permission sought to share information with:  Family Supports, Facility Sport and exercise psychologist Permission granted to share information::  Yes, Verbal Permission Granted  Name::     Husband Yuval Nolet [phone] Daughter [name] [name]  Agency::  yes- was at [facility]oor for rehab  Relationship::  yes  Contact Information:  yes  Housing/Transportation Living arrangements for the past 2 months:  Single Family Home Source of Information:  Adult Children, Spouse Patient Interpreter Needed:  None Criminal Activity/Legal Involvement Pertinent to Current Situation/Hospitalization:  No - Comment as needed Significant Relationships:  Adult Children, Spouse, Siblings Lives with:    Do you feel safe going back to the place where you live?  Yes Need for family participation in patient care:  Yes (Comment)  Care giving concerns:  Family was concerned with patients medical health and which geri-psych hospital will she go to. LCSW provided a case conference to provide in home resources and supports for the family, Dementia Services hand out care giver handout, day programs, PACE, and skilled nursing home facilities for future references.   Social Worker assessment / plan: LCSW met with patient and entire family to complete this assessment. Patient is IVC and will go to a geri-pychiatric bed as soon as a bed comes available. Patient is experiencing fluctuating orientation and hears and see things that are not there. She has experiences several recent falls and was at [facility] for a recent shoulder injury and has chronic pain issues RA. Patient is incontinent and needs assistance to bath and dress and she can feed herself but  doesn't cook. Her husband reports he does it all for his wife and  He cleans,cooks, drives her, and takes care of dressing and bathing her as well. She currently is on 3 litres 02 and has in home PT 3x week. Sometimes she refuses her night time medications and will get up and wander around and according to husband this is when she falls and is at risk of self injury.   Employment status:  Retired Forensic scientist:   Therapist, occupational) PT Recommendations:  Not assessed at this time Information / Referral to community resources:  [place]  Patient/Family's Response to care: Concerned about her going to a hospital far away.  Patient/Family's Understanding of and Emotional Response to Diagnosis, Current Treatment, and Prognosis:  They understand she is not well and very paranoid and confused and at times is not oriented to person place or situation.  Emotional Assessment Appearance:  Appears stated age Attitude/Demeanor/Rapport:   unable to assess Affect (typically observed):   Lethargic Orientation:  Oriented to Self, Oriented to  Time, Oriented to Situation Alcohol / Substance use:  Never Used Psych involvement (Current and /or in the community):  No (Comment)  Discharge Needs  Concerns to be addressed:  Cognitive Concerns, Coping/Stress Concerns, Care Coordination Readmission within the last 30 days:    Current discharge risk:  Cognitively Impaired (Possible early staged of dementia) Barriers to Discharge:  Psych Bed not available   Joana Reamer, LCSW 10/20/2015, 9:48 AM

## 2015-10-20 NOTE — Progress Notes (Signed)
LCSW called and spoke to Daughter [phone] < She is very concerned about her mothers care. She had recently been to [facility] for rehabilitation for her shoulder, she is connected to a [facility] in [place] Dr Norville Haggard and does take 10 mg Oxi-coden 4x day, patients daughter states her mother has severe chronic arthritis. It was agreed LCSW and daughter will meet at noon with her Dad to find a suitable placement.  [employer] LCSW [phone]

## 2015-10-20 NOTE — BH Assessment (Signed)
Writer spoke with ER MD (Dr. Scotty Court) about patient being accepted to [facility] and he states she can transport via Patent examiner. Writer informed Patient's Nurse Viviann Spare) and ER Kathrynn Speed [facility]) as well.

## 2015-10-20 NOTE — ED Provider Notes (Signed)
-----------------------------------------   7:29 AM on 10/20/2015 -----------------------------------------  ----------------------------------------- 7:29 AM on 10/20/2015 -----------------------------------------   Blood pressure 137/75, pulse 111, temperature 98.4 F (36.9 C), temperature source Oral, resp. rate 20, height 5\' 3"  (1.6 m), SpO2 97 %.  The patient had no acute events since last update.  Calm and cooperative at this time.  Disposition is pending per Psychiatry/Behavioral Medicine team recommendations. She is alert and oriented at this time. She does think she is in [place] but otherwise knows the date andhappening. a little bit tachycardic and she is somewhat anxious about her OxyContin prescription she states she needs pain medication for her chronic back pain which is unchanged. She states she usually takes those 4 times a day and has not had it since she got here.     [place], MD 10/20/15 [phone]

## 2015-10-20 NOTE — ED Notes (Signed)
Pt refused her 1300 fingerstick

## 2015-10-20 NOTE — Progress Notes (Signed)
TTS and LCSW consulted LCSW faxed over CT scans for head and chest x ray and spoke to EDP about patient requires potassium medicine. EDP will review and order.  [employer] LCSW [phone]

## 2015-10-20 NOTE — ED Notes (Signed)

## 2015-10-20 NOTE — ED Notes (Signed)
Pt refused her 0800 fingerstick

## 2015-10-20 NOTE — Progress Notes (Signed)
Received call from Carnella Guadalajara to send IVC back pages and Dr Note that patient received her potassium medication. Patient has been accepted by [facility]. Admitting doctor  Dr Merilyn Baba Call report number [phone] LCSW [phone]

## 2015-10-21 LAB — GLUCOSE, CAPILLARY: GLUCOSE-CAPILLARY: 103 mg/dL — AB (ref 65–99)

## 2015-10-21 MED ORDER — HYDROXYZINE HCL 25 MG PO TABS
50.0000 mg | ORAL_TABLET | Freq: Once | ORAL | Status: AC
  Administered 2015-10-21: 50 mg via ORAL
  Filled 2015-10-21: qty 2

## 2015-10-21 NOTE — ED Provider Notes (Signed)
-----------------------------------------   5:54 AM on 10/21/2015 -----------------------------------------   Blood pressure 108/75, pulse 112, temperature 97.7 F (36.5 C), temperature source Oral, resp. rate 18, height 5\' 3"  (1.6 m), SpO2 97 %.  The patient had no acute events since last update.  Calm and cooperative at this time.  Disposition is pending per Psychiatry/Behavioral Medicine team recommendations.     , MD 10/21/15 [phone]

## 2015-10-21 NOTE — Progress Notes (Signed)
LCSW consulted with ED nurse as to why patient did not transfer. It was disclosed there was no female sheriff to transport.  LCSW called daughter Michelle Cabrera and left a message . Anyelin Mogle LCSW   Aundrea Horace Avon LCSW

## 2015-10-21 NOTE — ED Notes (Signed)
Pt in BR with rover to void, clean up and change scrubs; back to bed;

## 2015-10-21 NOTE — ED Notes (Signed)
Spoke with patient's son, informed him that patient would be transferring to [place] in the next 15 minutes.  Informed him that patient ate 75% of her breakfast this morning and took her medications without any problems.  Also informed him that the patient did receive her dose of Enbrel that was brought to the hospital yesterday. Son expressed concerns regarding the transport of his mother via sheriff, informed him that she is medically stable to transport and if she did have any medical problems during transport, the Field seismologist can dispatch EMS if needed.

## 2015-10-21 NOTE — ED Notes (Signed)
Pt awake, denies need to use bathroom; pt taking small naps intermittently

## 2015-10-21 NOTE — Progress Notes (Signed)
LCSW returned call to patient daughter who was concerned about her mother eating food. ED nurse Morrie Sheldon reported she ate 75% of her breakfast tray and will be transported in hospital scrubs as its a requirement for all IVC patients. Her personal effects- [employer]gg Shoes and PJ will accompany her to [place]. LCSW was informed sheriff is on their way to transport her this Am. No further contact required.  [employer] LCSW [phone]

## 2015-10-22 LAB — URINE CULTURE

## 2015-11-07 DIAGNOSIS — F325 Major depressive disorder, single episode, in full remission: Secondary | ICD-10-CM | POA: Insufficient documentation

## 2015-11-09 ENCOUNTER — Inpatient Hospital Stay
Admission: EM | Admit: 2015-11-09 | Discharge: 2015-11-11 | DRG: 312 | Disposition: A | Discharge status: Home / Self Care | Payer: Medicare Other | Attending: Internal Medicine | Admitting: Internal Medicine

## 2015-11-09 DIAGNOSIS — R55 Syncope and collapse: Secondary | ICD-10-CM | POA: Diagnosis present

## 2015-11-09 DIAGNOSIS — F419 Anxiety disorder, unspecified: Secondary | ICD-10-CM | POA: Diagnosis present

## 2015-11-09 DIAGNOSIS — M25529 Pain in unspecified elbow: Secondary | ICD-10-CM | POA: Diagnosis present

## 2015-11-09 DIAGNOSIS — M359 Systemic involvement of connective tissue, unspecified: Secondary | ICD-10-CM | POA: Diagnosis present

## 2015-11-09 DIAGNOSIS — M25522 Pain in left elbow: Secondary | ICD-10-CM

## 2015-11-09 DIAGNOSIS — Y92009 Unspecified place in unspecified non-institutional (private) residence as the place of occurrence of the external cause: Secondary | ICD-10-CM

## 2015-11-09 DIAGNOSIS — F17200 Nicotine dependence, unspecified, uncomplicated: Secondary | ICD-10-CM | POA: Diagnosis present

## 2015-11-09 DIAGNOSIS — M25512 Pain in left shoulder: Secondary | ICD-10-CM

## 2015-11-09 DIAGNOSIS — G8929 Other chronic pain: Secondary | ICD-10-CM | POA: Diagnosis present

## 2015-11-09 DIAGNOSIS — Z888 Allergy status to other drugs, medicaments and biological substances status: Secondary | ICD-10-CM

## 2015-11-09 DIAGNOSIS — J449 Chronic obstructive pulmonary disease, unspecified: Secondary | ICD-10-CM | POA: Diagnosis present

## 2015-11-09 DIAGNOSIS — Z794 Long term (current) use of insulin: Secondary | ICD-10-CM

## 2015-11-09 DIAGNOSIS — M545 Low back pain: Secondary | ICD-10-CM | POA: Diagnosis present

## 2015-11-09 DIAGNOSIS — I959 Hypotension, unspecified: Secondary | ICD-10-CM | POA: Diagnosis present

## 2015-11-09 DIAGNOSIS — F339 Major depressive disorder, recurrent, unspecified: Secondary | ICD-10-CM | POA: Diagnosis present

## 2015-11-09 DIAGNOSIS — F23 Brief psychotic disorder: Secondary | ICD-10-CM | POA: Diagnosis present

## 2015-11-09 DIAGNOSIS — M069 Rheumatoid arthritis, unspecified: Secondary | ICD-10-CM | POA: Diagnosis present

## 2015-11-09 DIAGNOSIS — S81811A Laceration without foreign body, right lower leg, initial encounter: Secondary | ICD-10-CM | POA: Diagnosis present

## 2015-11-09 DIAGNOSIS — Z7982 Long term (current) use of aspirin: Secondary | ICD-10-CM

## 2015-11-09 DIAGNOSIS — K219 Gastro-esophageal reflux disease without esophagitis: Secondary | ICD-10-CM | POA: Diagnosis present

## 2015-11-09 DIAGNOSIS — W1789XA Other fall from one level to another, initial encounter: Secondary | ICD-10-CM | POA: Diagnosis present

## 2015-11-09 DIAGNOSIS — I10 Essential (primary) hypertension: Secondary | ICD-10-CM | POA: Diagnosis present

## 2015-11-09 DIAGNOSIS — G459 Transient cerebral ischemic attack, unspecified: Secondary | ICD-10-CM

## 2015-11-09 DIAGNOSIS — M1990 Unspecified osteoarthritis, unspecified site: Secondary | ICD-10-CM | POA: Diagnosis present

## 2015-11-09 DIAGNOSIS — R471 Dysarthria and anarthria: Secondary | ICD-10-CM | POA: Diagnosis present

## 2015-11-09 DIAGNOSIS — Z885 Allergy status to narcotic agent status: Secondary | ICD-10-CM

## 2015-11-09 DIAGNOSIS — E876 Hypokalemia: Secondary | ICD-10-CM | POA: Diagnosis present

## 2015-11-09 DIAGNOSIS — F17210 Nicotine dependence, cigarettes, uncomplicated: Secondary | ICD-10-CM | POA: Diagnosis present

## 2015-11-09 DIAGNOSIS — E119 Type 2 diabetes mellitus without complications: Secondary | ICD-10-CM | POA: Diagnosis present

## 2015-11-09 NOTE — ED Notes (Signed)
Pt to rm 15 via EMS from home.  EMS report pt had fall around 6:30pm off a back patio about 3 ft.  Pt reports hazy memory of event but A&O since.  Pt reports left sided CP radiating to left scapula, described as heaviness.  Pt took 10 oxy and 324 asa.  Pt with skin tear to left lower leg about 6inches long, vertical.  Skin tear to right lower leg, horizontal, about 4inches long.  PT NAD upon arrival, respirations equal and unlabored, skin warm and dry.

## 2015-11-10 ENCOUNTER — Encounter: Payer: Self-pay | Admitting: Emergency Medicine

## 2015-11-10 ENCOUNTER — Emergency Department: Payer: Medicare Other

## 2015-11-10 DIAGNOSIS — W1789XA Other fall from one level to another, initial encounter: Secondary | ICD-10-CM | POA: Diagnosis present

## 2015-11-10 DIAGNOSIS — Z885 Allergy status to narcotic agent status: Secondary | ICD-10-CM | POA: Diagnosis not present

## 2015-11-10 DIAGNOSIS — G8929 Other chronic pain: Secondary | ICD-10-CM | POA: Diagnosis present

## 2015-11-10 DIAGNOSIS — I959 Hypotension, unspecified: Secondary | ICD-10-CM | POA: Diagnosis present

## 2015-11-10 DIAGNOSIS — M25529 Pain in unspecified elbow: Secondary | ICD-10-CM | POA: Diagnosis present

## 2015-11-10 DIAGNOSIS — R55 Syncope and collapse: Secondary | ICD-10-CM | POA: Diagnosis present

## 2015-11-10 DIAGNOSIS — F23 Brief psychotic disorder: Secondary | ICD-10-CM | POA: Diagnosis present

## 2015-11-10 DIAGNOSIS — M1990 Unspecified osteoarthritis, unspecified site: Secondary | ICD-10-CM | POA: Diagnosis present

## 2015-11-10 DIAGNOSIS — F419 Anxiety disorder, unspecified: Secondary | ICD-10-CM | POA: Diagnosis present

## 2015-11-10 DIAGNOSIS — S81811A Laceration without foreign body, right lower leg, initial encounter: Secondary | ICD-10-CM | POA: Diagnosis present

## 2015-11-10 DIAGNOSIS — M359 Systemic involvement of connective tissue, unspecified: Secondary | ICD-10-CM | POA: Diagnosis present

## 2015-11-10 DIAGNOSIS — E876 Hypokalemia: Secondary | ICD-10-CM | POA: Diagnosis present

## 2015-11-10 DIAGNOSIS — R471 Dysarthria and anarthria: Secondary | ICD-10-CM | POA: Diagnosis present

## 2015-11-10 DIAGNOSIS — J449 Chronic obstructive pulmonary disease, unspecified: Secondary | ICD-10-CM | POA: Diagnosis present

## 2015-11-10 DIAGNOSIS — F17210 Nicotine dependence, cigarettes, uncomplicated: Secondary | ICD-10-CM | POA: Diagnosis present

## 2015-11-10 DIAGNOSIS — Z7982 Long term (current) use of aspirin: Secondary | ICD-10-CM | POA: Diagnosis not present

## 2015-11-10 DIAGNOSIS — K219 Gastro-esophageal reflux disease without esophagitis: Secondary | ICD-10-CM | POA: Diagnosis present

## 2015-11-10 DIAGNOSIS — F17200 Nicotine dependence, unspecified, uncomplicated: Secondary | ICD-10-CM | POA: Diagnosis present

## 2015-11-10 DIAGNOSIS — Y92009 Unspecified place in unspecified non-institutional (private) residence as the place of occurrence of the external cause: Secondary | ICD-10-CM | POA: Diagnosis not present

## 2015-11-10 DIAGNOSIS — I10 Essential (primary) hypertension: Secondary | ICD-10-CM | POA: Diagnosis present

## 2015-11-10 DIAGNOSIS — F339 Major depressive disorder, recurrent, unspecified: Secondary | ICD-10-CM | POA: Diagnosis present

## 2015-11-10 DIAGNOSIS — Z794 Long term (current) use of insulin: Secondary | ICD-10-CM | POA: Diagnosis not present

## 2015-11-10 DIAGNOSIS — E119 Type 2 diabetes mellitus without complications: Secondary | ICD-10-CM | POA: Diagnosis present

## 2015-11-10 DIAGNOSIS — M545 Low back pain: Secondary | ICD-10-CM | POA: Diagnosis present

## 2015-11-10 DIAGNOSIS — Z888 Allergy status to other drugs, medicaments and biological substances status: Secondary | ICD-10-CM | POA: Diagnosis not present

## 2015-11-10 DIAGNOSIS — M069 Rheumatoid arthritis, unspecified: Secondary | ICD-10-CM | POA: Diagnosis present

## 2015-11-10 LAB — BASIC METABOLIC PANEL
ANION GAP: 7 (ref 5–15)
BUN: 19 mg/dL (ref 6–20)
CHLORIDE: 105 mmol/L (ref 101–111)
CO2: 27 mmol/L (ref 22–32)
CREATININE: 0.62 mg/dL (ref 0.44–1.00)
Calcium: 8.4 mg/dL — ABNORMAL LOW (ref 8.9–10.3)
GFR calc non Af Amer: >60 mL/min (ref >60)
Glucose, Bld: 144 mg/dL — ABNORMAL HIGH (ref 65–99)
Potassium: 2.7 mmol/L — CL (ref 3.5–5.1)
SODIUM: 139 mmol/L (ref 135–145)

## 2015-11-10 LAB — CREATININE, SERUM: Creatinine, Ser: 0.59 mg/dL (ref 0.44–1.00)

## 2015-11-10 LAB — CBC
HCT: 30.7 % — ABNORMAL LOW (ref 35.0–47.0)
HEMATOCRIT: 30 % — AB (ref 35.0–47.0)
HEMOGLOBIN: 10 g/dL — AB (ref 12.0–16.0)
Hemoglobin: 10.1 g/dL — ABNORMAL LOW (ref 12.0–16.0)
MCH: 31.5 pg (ref 26.0–34.0)
MCH: 31.8 pg (ref 26.0–34.0)
MCHC: 32.8 g/dL (ref 32.0–36.0)
MCHC: 33.2 g/dL (ref 32.0–36.0)
MCV: 94.7 fL (ref 80.0–100.0)
MCV: 97.1 fL (ref 80.0–100.0)
PLATELETS: 482 10*3/uL — AB (ref 150–440)
Platelets: 435 10*3/uL (ref 150–440)
RBC: 3.16 MIL/uL — AB (ref 3.80–5.20)
RBC: 3.17 MIL/uL — AB (ref 3.80–5.20)
RDW: 15.4 % — ABNORMAL HIGH (ref 11.5–14.5)
RDW: 15.5 % — ABNORMAL HIGH (ref 11.5–14.5)
WBC: 6.2 10*3/uL (ref 3.6–11.0)
WBC: 7.1 10*3/uL (ref 3.6–11.0)

## 2015-11-10 LAB — GLUCOSE, CAPILLARY
GLUCOSE-CAPILLARY: 84 mg/dL (ref 65–99)
Glucose-Capillary: 100 mg/dL — ABNORMAL HIGH (ref 65–99)
Glucose-Capillary: 97 mg/dL (ref 65–99)
Glucose-Capillary: 141 mg/dL — ABNORMAL HIGH (ref 65–99)

## 2015-11-10 LAB — MRSA PCR SCREENING: MRSA BY PCR: NEGATIVE

## 2015-11-10 LAB — TROPONIN I

## 2015-11-10 LAB — POTASSIUM: POTASSIUM: 4.4 mmol/L (ref 3.5–5.1)

## 2015-11-10 MED ORDER — CETYLPYRIDINIUM CHLORIDE 0.05 % MT LIQD
7.0000 mL | Freq: Two times a day (BID) | OROMUCOSAL | Status: DC
  Administered 2015-11-11: 7 mL via OROMUCOSAL

## 2015-11-10 MED ORDER — IPRATROPIUM-ALBUTEROL 0.5-2.5 (3) MG/3ML IN SOLN: 3.0000 mL | Freq: Four times a day (QID) | RESPIRATORY_TRACT | Status: DC | PRN

## 2015-11-10 MED ORDER — POTASSIUM CHLORIDE IN NACL 40-0.9 MEQ/L-% IV SOLN
INTRAVENOUS | Status: DC
  Administered 2015-11-10: 75 mL/h via INTRAVENOUS
  Filled 2015-11-10 (×2): qty 1000

## 2015-11-10 MED ORDER — ONDANSETRON HCL 4 MG/2ML IJ SOLN: 4.0000 mg | Freq: Four times a day (QID) | INTRAMUSCULAR | Status: DC | PRN

## 2015-11-10 MED ORDER — OXYCODONE HCL 5 MG PO TABS
10.0000 mg | ORAL_TABLET | Freq: Four times a day (QID) | ORAL | Status: DC | PRN
  Administered 2015-11-10 – 2015-11-11 (×6): 10 mg via ORAL
  Filled 2015-11-10 (×6): qty 2

## 2015-11-10 MED ORDER — BUDESONIDE 0.25 MG/2ML IN SUSP
0.2500 mg | Freq: Two times a day (BID) | RESPIRATORY_TRACT | Status: DC
Start: 2015-11-10 — End: 2015-11-11
  Administered 2015-11-10 – 2015-11-11 (×3): 0.25 mg via RESPIRATORY_TRACT
  Filled 2015-11-10 (×4): qty 2

## 2015-11-10 MED ORDER — DULOXETINE HCL 30 MG PO CPEP
60.0000 mg | ORAL_CAPSULE | Freq: Every morning | ORAL | Status: DC
  Administered 2015-11-10 – 2015-11-11 (×2): 60 mg via ORAL
  Filled 2015-11-10 (×2): qty 2

## 2015-11-10 MED ORDER — INSULIN ASPART 100 UNIT/ML ~~LOC~~ SOLN
0.0000 [IU] | Freq: Three times a day (TID) | SUBCUTANEOUS | Status: DC
  Administered 2015-11-11: 1 [IU] via SUBCUTANEOUS
  Filled 2015-11-10 (×2): qty 1

## 2015-11-10 MED ORDER — ASPIRIN EC 81 MG PO TBEC
81.0000 mg | DELAYED_RELEASE_TABLET | Freq: Every day | ORAL | Status: DC
  Administered 2015-11-10: 81 mg via ORAL
  Filled 2015-11-10: qty 1

## 2015-11-10 MED ORDER — ACETAMINOPHEN 325 MG PO TABS
650.0000 mg | ORAL_TABLET | Freq: Four times a day (QID) | ORAL | Status: DC | PRN
  Administered 2015-11-10: 650 mg via ORAL
  Filled 2015-11-10: qty 2

## 2015-11-10 MED ORDER — GABAPENTIN 300 MG PO CAPS: 600.0000 mg | ORAL_CAPSULE | Freq: Three times a day (TID) | ORAL | Status: DC | PRN

## 2015-11-10 MED ORDER — SODIUM CHLORIDE 0.9 % IV BOLUS (SEPSIS)
1000.0000 mL | Freq: Once | INTRAVENOUS | Status: AC
  Administered 2015-11-10: 500 mL via INTRAVENOUS

## 2015-11-10 MED ORDER — ACETAMINOPHEN 325 MG PO TABS: 325.0000 mg | ORAL_TABLET | Freq: Four times a day (QID) | ORAL | Status: DC | PRN

## 2015-11-10 MED ORDER — SODIUM CHLORIDE 0.9 % IV BOLUS (SEPSIS)
1000.0000 mL | Freq: Once | INTRAVENOUS | Status: AC
  Administered 2015-11-10: 1000 mL via INTRAVENOUS

## 2015-11-10 MED ORDER — ENOXAPARIN SODIUM 40 MG/0.4ML ~~LOC~~ SOLN
40.0000 mg | Freq: Every day | SUBCUTANEOUS | Status: DC
  Administered 2015-11-10 – 2015-11-11 (×2): 40 mg via SUBCUTANEOUS
  Filled 2015-11-10 (×2): qty 0.4

## 2015-11-10 MED ORDER — MORPHINE SULFATE (PF) 2 MG/ML IV SOLN
2.0000 mg | Freq: Once | INTRAVENOUS | Status: AC
  Administered 2015-11-10: 2 mg via INTRAVENOUS

## 2015-11-10 MED ORDER — ARFORMOTEROL TARTRATE 15 MCG/2ML IN NEBU
15.0000 ug | INHALATION_SOLUTION | Freq: Two times a day (BID) | RESPIRATORY_TRACT | Status: DC
  Administered 2015-11-10 – 2015-11-11 (×3): 15 ug via RESPIRATORY_TRACT
  Filled 2015-11-10 (×7): qty 2

## 2015-11-10 MED ORDER — SODIUM CHLORIDE 0.9 % IV SOLN: Freq: Once | INTRAVENOUS | Status: DC

## 2015-11-10 MED ORDER — QUETIAPINE FUMARATE 100 MG PO TABS
100.0000 mg | ORAL_TABLET | Freq: Every day | ORAL | Status: DC
  Administered 2015-11-10: 100 mg via ORAL
  Filled 2015-11-10: qty 1

## 2015-11-10 MED ORDER — MORPHINE SULFATE (PF) 2 MG/ML IV SOLN
1.0000 mg | Freq: Four times a day (QID) | INTRAVENOUS | Status: DC | PRN
  Administered 2015-11-10: 1 mg via INTRAVENOUS
  Filled 2015-11-10: qty 1

## 2015-11-10 MED ORDER — POTASSIUM CHLORIDE 20 MEQ PO PACK
40.0000 meq | PACK | Freq: Two times a day (BID) | ORAL | Status: DC
  Administered 2015-11-10 (×3): 40 meq via ORAL
  Filled 2015-11-10 (×3): qty 2

## 2015-11-10 MED ORDER — ACETAMINOPHEN 650 MG RE SUPP: 650.0000 mg | Freq: Four times a day (QID) | RECTAL | Status: DC | PRN

## 2015-11-10 MED ORDER — ONDANSETRON HCL 4 MG PO TABS: 4.0000 mg | ORAL_TABLET | Freq: Four times a day (QID) | ORAL | Status: DC | PRN

## 2015-11-10 MED ORDER — OXYCODONE-ACETAMINOPHEN 10-325 MG PO TABS: 1.0000 | ORAL_TABLET | Freq: Four times a day (QID) | ORAL | Status: DC | PRN

## 2015-11-10 MED ORDER — FLUTICASONE FUROATE-VILANTEROL 100-25 MCG/INH IN AEPB: 1.0000 | INHALATION_SPRAY | Freq: Every morning | RESPIRATORY_TRACT | Status: DC

## 2015-11-10 MED ORDER — ALBUTEROL SULFATE (2.5 MG/3ML) 0.083% IN NEBU: 3.0000 mL | INHALATION_SOLUTION | Freq: Four times a day (QID) | RESPIRATORY_TRACT | Status: DC | PRN

## 2015-11-10 MED ORDER — VITAMIN C 500 MG PO TABS
500.0000 mg | ORAL_TABLET | Freq: Every day | ORAL | Status: DC
  Administered 2015-11-10 – 2015-11-11 (×2): 500 mg via ORAL
  Filled 2015-11-10 (×2): qty 1

## 2015-11-10 MED ORDER — POTASSIUM CHLORIDE CRYS ER 10 MEQ PO TBCR
10.0000 meq | EXTENDED_RELEASE_TABLET | Freq: Every day | ORAL | Status: DC
  Administered 2015-11-10 – 2015-11-11 (×2): 10 meq via ORAL
  Filled 2015-11-10 (×3): qty 1

## 2015-11-10 MED ORDER — PANTOPRAZOLE SODIUM 40 MG PO TBEC
40.0000 mg | DELAYED_RELEASE_TABLET | Freq: Every day | ORAL | Status: DC
  Administered 2015-11-10 – 2015-11-11 (×2): 40 mg via ORAL
  Filled 2015-11-10 (×3): qty 1

## 2015-11-10 MED ORDER — CALCIUM CITRATE-VITAMIN D 500-400 MG-UNIT PO CHEW
1.0000 | CHEWABLE_TABLET | Freq: Two times a day (BID) | ORAL | Status: DC
  Administered 2015-11-10 – 2015-11-11 (×2): 1 via ORAL
  Filled 2015-11-10 (×5): qty 1

## 2015-11-10 MED ORDER — METOPROLOL TARTRATE 25 MG PO TABS
25.0000 mg | ORAL_TABLET | Freq: Two times a day (BID) | ORAL | Status: DC
  Administered 2015-11-10 – 2015-11-11 (×3): 25 mg via ORAL
  Filled 2015-11-10 (×4): qty 1

## 2015-11-10 MED ORDER — MORPHINE SULFATE (PF) 2 MG/ML IV SOLN
INTRAVENOUS | Status: AC
  Administered 2015-11-10: 2 mg via INTRAVENOUS
  Filled 2015-11-10: qty 1

## 2015-11-10 NOTE — Care Management (Signed)
Patient placed in observation for syncope in the icu stepdown. Placed in stepdown for closer monitoring of patient's blood pressure as she had some hypotension in the ED.  Blood pressures have been stable.  Patient may be orthostatic.  She fell at home but does not remember the fall.  She denies tripping.  Patient with chronic severe rheumatoid arthriitis since age 70.  She has had a recent [facility] stay and discharged to [facility].  She discharged home .  Reports that was sent to [facility] 4/1.  Her sister who is at bedside confirms .  Patient resides at home with her husband.  She currently has [facility] SN and PT providing care.  She has access to walker, BSC and wheelchair.  She does not wish to consider skilled nursing placement.  Wishes to go home and resume home health services.  PT consult is pending.

## 2015-11-10 NOTE — Progress Notes (Signed)
Potassium 4.4.  Dr. Cheron Schaumann notified.  Telephone order obtained.

## 2015-11-10 NOTE — Care Management Obs Status (Addendum)
MEDICARE OBSERVATION STATUS NOTIFICATION   Patient Details  Name: Michelle Cabrera MRN: [id number] Date of Birth: [birth year]   Medicare Observation Status Notification Given:  Yes MD admitting entered observation order 0148 and stated observation in the h/p.  Another order appeared about 2 hours later (03:48)  from another physician. This CM did not see an inpatient order- gave obs notice, then noticed there is also an inpatient order but could not track reason for inpatient.  ED CM apparently only saw the inpatient order and not the obs order.   No documentation indicating a clinical decline.     Eber Hong, RN 11/10/2015, 2:33 PM

## 2015-11-10 NOTE — Progress Notes (Signed)
Patient transferred to Room 246. PT working with patient upon arrival to room, ambulating in hallway. Oriented to room, staff, and call bell. Safety plan reviewed, contract signed, bed alarm and socks on. Belongings within reach. Visitor at bedside. Telemetry box verified with CCMD and Farley Ly from CCU on transfer. Skin assessment with Nash Mantis. Focused assessment as charted.  Patient now in bed resting quietly without any complaints. Will continue to monitor.

## 2015-11-10 NOTE — Evaluation (Signed)
Physical Therapy Evaluation Patient Details Name: Michelle Cabrera MRN: [id number] DOB: 13-Jan-1946 Today's Date: 11/10/2015   History of Present Illness  70 y.o. female with a known history of hypertension, COPD, GERD, rheumatoid arthritis, decubitus ulcer, degenerative disc disease. She was recently at a geriatric psych facility, reports she is feeling much closer to her normal since changing meds. Patient lives with her husband at home and patient's son and daughter live close by. Does not ambulate much at home.  Pt reports multiple falls in the last 6 months. Pt reports that she was ambulating with rolling walker at home in December 2016. Pt admitted with syncopal episode/fall on the steps.   Clinical Impression  Pt did well with 200 ft of ambulation with FWW with only minimal fatigue and no significant safety issues.  Regarding acute care PT she is safe to return home, she was having HHPT for her recent shoulder injury and this would still be appropriate. No further PT needs in house.    Follow Up Recommendations Home health PT (for R shoulder )    Equipment Recommendations       Recommendations for Other Services       Precautions / Restrictions Precautions Precautions: Fall Restrictions Weight Bearing Restrictions: No      Mobility  Bed Mobility Overal bed mobility: Modified Independent;Needs Assistance Bed Mobility: Sit to Supine       Sit to supine: Min guard   General bed mobility comments: Pt is able to get back up into bed w/o direct assist  Transfers Overall transfer level: Modified independent Equipment used: Rolling walker (2 wheeled)             General transfer comment: Pt is able to get up to standing w/o direct assist  Ambulation/Gait Ambulation/Gait assistance: Min guard Ambulation Distance (Feet): 200 Feet Assistive device: Rolling walker (2 wheeled)       General Gait Details: Pt is able to ambulate with consistent gait and only slightly  hesitant cadence.  She reports some pain and stiffness but that it feels good to be up and walking.  Pt shows good effort and though she has some fatigue with the effort she does well and is safe.   Stairs            Wheelchair Mobility    Modified Rankin (Stroke Patients Only)       Balance                                             Pertinent Vitals/Pain Pain Assessment:  (general arthritic pain/soreness)    Home Living Family/patient expects to be discharged to:: Private residence Living Arrangements: Spouse/significant other Available Help at Discharge: Family;Available 24 hours/day Type of Home: House Home Access: Ramped entrance     Home Layout: One level Home Equipment: Walker - 2 wheels;Shower seat;Cane - single point;Bedside commode;Wheelchair - manual      Prior Function Level of Independence: Needs assistance   Gait / Transfers Assistance Needed: Pt with limited recent mobility.  Pt was having HHPT for recent R shoulder fx           Hand Dominance        Extremity/Trunk Assessment   Upper Extremity Assessment: RUE deficits/detail;Generalized weakness RUE Deficits / Details: pt unable to elevate R shoulder > 70 degrees, + pain  Lower Extremity Assessment: Generalized weakness (pt weak, but grossly functional t/o session)         Communication   Communication: No difficulties  Cognition Arousal/Alertness: Awake/alert Behavior During Therapy: Anxious Overall Cognitive Status: Within Functional Limits for tasks assessed                      General Comments      Exercises        Assessment/Plan    PT Assessment All further PT needs can be met in the next venue of care  PT Diagnosis Generalized weakness;Difficulty walking   PT Problem List Decreased strength;Decreased range of motion;Decreased activity tolerance;Pain  PT Treatment Interventions     PT Goals (Current goals can be found in the  Care Plan section) Acute Rehab PT Goals Patient Stated Goal: go home PT Goal Formulation: With patient Time For Goal Achievement: 11/24/15 Potential to Achieve Goals: Good    Frequency     Barriers to discharge        Co-evaluation               End of Session Equipment Utilized During Treatment: Gait belt Activity Tolerance: Patient tolerated treatment well Patient left: with bed alarm set;with family/visitor present           Time: 3235-5732 PT Time Calculation (min) (ACUTE ONLY): 22 min   Charges:   PT Evaluation $PT Eval Low Complexity: 1 Procedure     PT G Codes:       Wayne Both, PT, DPT [phone]  Kreg Shropshire 11/10/2015, 4:05 PM

## 2015-11-10 NOTE — Progress Notes (Signed)
[facility] [place] at [facility]   PATIENT NAME: Michelle Cabrera    MR#:  [id number]  DATE OF BIRTH:  04-20-1946  SUBJECTIVE:  CHIEF COMPLAINT:  Patient is resting comfortably. Denies any chest pain or shortness of breath. When she stood up to go to the potty seat did not experience any dizziness today. Reporting left shoulder, elbow and forearm pain  REVIEW OF SYSTEMS:  CONSTITUTIONAL: No fever, fatigue or weakness.  EYES: No blurred or double vision.  EARS, NOSE, AND THROAT: No tinnitus or ear pain.  RESPIRATORY: No cough, shortness of breath, wheezing or hemoptysis.  CARDIOVASCULAR: No chest pain, orthopnea, edema.  GASTROINTESTINAL: No nausea, vomiting, diarrhea or abdominal pain.  GENITOURINARY: No dysuria, hematuria.  ENDOCRINE: No polyuria, nocturia,  HEMATOLOGY: No anemia, easy bruising or bleeding SKIN: No rash or lesion. MUSCULOSKELETAL: No joint pain or arthritis Except left shoulder and elbow pain NEUROLOGIC: No tingling, numbness, weakness.  PSYCHIATRY: No anxiety or depression.   DRUG ALLERGIES:   Allergies  Allergen Reactions  . Gold-Containing Drug Products Anaphylaxis  . Adhesive [Tape]     Tears skin  . Dilaudid [Hydromorphone Hcl]     Made tongue swell  . Pravastatin     Arms and legs hurt     VITALS:  Blood pressure 106/68, pulse 85, temperature 97.9 F (36.6 C), temperature source Oral, resp. rate 18, height 5\' 2"  (1.575 m), weight 62.4 kg (137 lb 9.1 oz), SpO2 97 %.  PHYSICAL EXAMINATION:  GENERAL:  70 y.o.-year-old patient lying in the bed with no acute distress.  EYES: Pupils equal, round, reactive to light and accommodation. No scleral icterus. Extraocular muscles intact.  HEENT: Head atraumatic, normocephalic. Oropharynx and nasopharynx clear.  NECK:  Supple, no jugular venous distention. No thyroid enlargement, no tenderness.  LUNGS: Normal breath sounds bilaterally, no wheezing, rales,rhonchi or crepitation. No use  of accessory muscles of respiration.  CARDIOVASCULAR: S1, S2 normal. No murmurs, rubs, or gallops.  ABDOMEN: Soft, nontender, nondistended. Bowel sounds present. No organomegaly or mass.  EXTREMITIES: Left shoulder, left elbow and left hand are tender with limited range of motion No pedal edema, cyanosis, or clubbing.  NEUROLOGIC: Cranial nerves II through XII are intact. Muscle strength 5/5 in all extremities. Sensation intact. Gait not checked.  PSYCHIATRIC: The patient is alert and oriented x 3.  SKIN: No obvious rash, lesion, or ulcer.    LABORATORY PANEL:   CBC  Recent Labs Lab 11/10/15 0537  WBC 6.2  HGB 10.0*  HCT 30.0*  PLT 435   ------------------------------------------------------------------------------------------------------------------  Chemistries   Recent Labs Lab 11/10/15 0005 11/10/15 0537  NA 139  --   K 2.7*  --   CL 105  --   CO2 27  --   GLUCOSE 144*  --   BUN 19  --   CREATININE 0.62 0.59  CALCIUM 8.4*  --    ------------------------------------------------------------------------------------------------------------------  Cardiac Enzymes  Recent Labs Lab 11/10/15 1048  TROPONINI <0.03   ------------------------------------------------------------------------------------------------------------------  RADIOLOGY:  Dg Chest 2 View  11/10/2015  CLINICAL DATA:  11/12/2015 3 feet off a back patio at home around 18:30. EXAM: CHEST  2 VIEW COMPARISON:  10/19/2015 FINDINGS: The heart size and mediastinal contours are within normal limits. Both lungs are clear. The visualized skeletal structures are unremarkable. Chronic fracture deformity of the right proximal humerus. Prior thoracic and upper lumbar vertebroplasties. IMPRESSION: No active cardiopulmonary disease. Electronically Signed   By: 10/21/2015 M.D.   On: 11/10/2015 02:34  Dg Lumbar Spine Complete  11/10/2015  CLINICAL DATA:  Larey Seat 3 feet off a back patio at home at 18:30 EXAM: LUMBAR  SPINE - COMPLETE 4+ VIEW COMPARISON:  08/24/2014 FINDINGS: There is vertebroplasty at L1. There is posterior instrumented fusion with transpedicular hardware from L2 through L4, and an intervertebral fusion cage at L3-4. Mild chronic anterior wedging of L3 at L1. The other lumbar vertebrae are normal in height. No acute fracture is evident. No hardware failure is evident. Sacrum and sacroiliac joints appear unremarkable. IMPRESSION: Negative for acute lumbar spine fracture Electronically Signed   By: Ellery Plunk M.D.   On: 11/10/2015 02:36   Ct Head Wo Contrast  11/10/2015  CLINICAL DATA:  Larey Seat at home around 18:30, approximately 3 feet off a back patio. EXAM: CT HEAD WITHOUT CONTRAST TECHNIQUE: Contiguous axial images were obtained from the base of the skull through the vertex without intravenous contrast. COMPARISON:  10/19/2015 FINDINGS: There is no intracranial hemorrhage. There are subdural hygromas bilaterally, unchanged. Mild generalized atrophy, unchanged. Gray-white differentiation is preserved. No acute abnormality is evident. The calvarium and skullbase are intact. IMPRESSION: Negative for acute intracranial traumatic injury. Bilateral frontal subdural hygromas. Generalized atrophy. Electronically Signed   By: Ellery Plunk M.D.   On: 11/10/2015 02:16    EKG:   Orders placed or performed during the hospital encounter of 11/09/15  . EKG 12-Lead  . EKG 12-Lead  . ED EKG within 10 minutes  . ED EKG within 10 minutes    ASSESSMENT AND PLAN:    [name] is a 70 y.o. female with a known history of major depression, recurrent distant history of acute psychosis where she was admitted at geriatric psych unit in [facility], hypertension, COPD with ongoing tobacco abuse, rheumatoid arthritis, chronic low back pain comes to the emergency room after she had fallen on her deck causing injury/skin tear on her right lower extremity.  #. Syncopal episode/acute fall at home. No  precipitating event.  Appears more mechanical fall Transfer patient to telemetry, no overnight events Patient appears to be in sinus rhythm at present. No history of any cardiac issues in the past IV fluids Check orthostatics Cycle cardiac enzymes 3 -negative troponins Physical therapy to see patient Patient gets therapy at home. She is supposed to use a walker however forgets most of the time per husband  #Hypokalemia Replete potassium, repeat potassium level at 1300 today and BMP in a.m.  #Left shoulder and left elbow pain status post fall Will provide pain management as needed basis and obtain x-rays PT value is pending  #. COPD with ongoing tobacco abuse Smoking cessation advised 3 minutes spent, on nicotine patch Continue inhalers and DuoNeb nebs as needed. Continue oxygen.  3. Type 2 diabetes not on any medication. She was recently taken off her insulin We'll continue sliding scale  4. Major recurrent depression with acute psychosis. Patient was just recently discharged from geropsychiatric unit in [facility] Continue her psych meds  5. History of rheumatoid arthritis with chronic DJD and chronic pain Continue her DMARDS, patient can use her home medications as they're very expensive  our pharmacy cannot provide She follows with Dr. Barb Merino  6. DVT prophylaxis  SCD and teds. Patient has a lot of bruises in both upper and lower extremity and she is very easy to bruise and therefore will avoid antiplatelet agents.   Transfer patient to telemetry All the records are reviewed and case discussed with Care Management/Social Workerr. Management plans discussed with the patient,  And sister at bedside and they are in agreement.  CODE STATUS: fc  TOTAL TIME TAKING CARE OF THIS PATIENT: 35 minutes.   POSSIBLE D/C IN 1-2  DAYS, DEPENDING ON CLINICAL CONDITION.   Ramonita Lab M.D on 11/10/2015 at 1:03 PM  Between 7am to 6pm - Pager - [phone] After 6pm go to  [email or web address] - password [email or web address] [facility]  [facility] [place] [facility]  Office  [phone]  CC: Primary care physician; Lauro Regulus., MD

## 2015-11-10 NOTE — H&P (Signed)
[facility] - [place] at [facility]   PATIENT NAME: Michelle Cabrera    MR#:  [id number]  DATE OF BIRTH:  February 14, 1946  DATE OF ADMISSION:  11/09/2015  PRIMARY CARE PHYSICIAN: Lauro Regulus., MD   REQUESTING/REFERRING PHYSICIAN: Dr. Manson Passey  CHIEF COMPLAINT:   Possible syncopal episode had a fall at home HISTORY OF PRESENT ILLNESS:  Michelle Cabrera  is a 70 y.o. female with a known history of major depression, recurrent distant history of acute psychosis where she was admitted at geriatric psych unit in [facility], hypertension, COPD with ongoing tobacco abuse, rheumatoid arthritis, chronic low back pain comes to the emergency room after she had fallen on her deck causing injury/skin tear on her right lower extremity. Tegaderm has been applied. Patient does not remember the fall. She was having her normal routine day. She does not remember tripping over something. She has been hemodynamically stable and her labs have been stable as well. She is being admitted for overnight observation. Patient has been complaining off some chest discomfort more so after the fall.  PAST MEDICAL HISTORY:   Past Medical History  Diagnosis Date  . Hypertension   . Closed compression fracture of L1 lumbar vertebra (HCC) 11/2012  . COPD (chronic obstructive pulmonary disease) (HCC)   . Anxiety   . Depression   . GERD (gastroesophageal reflux disease)   . Rheumatoid arthritis (HCC)   . DDD (degenerative disc disease)   . Chronic lower back pain   . Collagen vascular disease (HCC)     PAST SURGICAL HISTOIRY:   Past Surgical History  Procedure Laterality Date  . Lumbar disc surgery       X 3  . Kyphoplasty N/A 01/04/2013    Procedure: L1 Kyphoplasty;  Surgeon: Hewitt Shorts, MD;  Location: [facility] NEURO ORS;  Service: Neurosurgery;  Laterality: N/A;  L1 Kyphoplasty  . Tubal ligation    . Foot surgery    . Wrist fusion Right     30 yrs ago  . Kyphoplasty N/A 02/07/2014     Procedure: THORACIC SIX KYPHOPLASTY;  Surgeon: Hewitt Shorts, MD;  Location: [facility] NEURO ORS;  Service: Neurosurgery;  Laterality: N/A;  T6 Kyphoplasty   . Hand tendon surgery Right 09/15/2014    "d/t RA"  . Tonsillectomy    . Posterior lumbar fusion      "got screws in"  . Back surgery    . Cataract extraction w/ intraocular lens  implant, bilateral Bilateral   . Finger arthroplasty Right 09/15/2014    Procedure: RIGHT MIDDLE FINGER, RING FINGER, SMALL FINGER EXTENSOR DIGITORUM COMMUMIS STABILIZATION WITH RIGHT MIDDLE FINGER MCP ARTHROPLASTY, POSSIBLE RING FINGER AND SMALL FINGER MCP ARTHROPLASTY;  Surgeon: Dominica Severin, MD;  Location: [facility] OR;  Service: Orthopedics;  Laterality: Right;  . Breast biopsy Left     negative    SOCIAL HISTORY:   Social History  Substance Use Topics  . Smoking status: Former Smoker -- 1.00 packs/day for 53 years    Types: Cigarettes  . Smokeless tobacco: Never Used  . Alcohol Use: No    FAMILY HISTORY:   Family History  Problem Relation Age of Onset  . CAD Father   . CAD Mother   . Hypertension Mother   . Peripheral vascular disease Mother   . Breast cancer Maternal Aunt 50    DRUG ALLERGIES:   Allergies  Allergen Reactions  . Gold-Containing Drug Products Anaphylaxis  . Adhesive [Tape]     Tears skin  .  Dilaudid [Hydromorphone Hcl]     Made tongue swell  . Pravastatin     Arms and legs hurt     REVIEW OF SYSTEMS:  Review of Systems  Constitutional: Negative for fever, chills and weight loss.  HENT: Negative for ear discharge, ear pain and nosebleeds.   Eyes: Negative for blurred vision, pain and discharge.  Respiratory: Negative for sputum production, shortness of breath, wheezing and stridor.   Cardiovascular: Negative for chest pain, palpitations, orthopnea and PND.  Gastrointestinal: Negative for nausea, vomiting, abdominal pain and diarrhea.  Genitourinary: Negative for urgency and frequency.  Musculoskeletal: Positive for  back pain, joint pain and falls.  Neurological: Positive for weakness. Negative for sensory change, speech change and focal weakness.  Psychiatric/Behavioral: Negative for depression and hallucinations. The patient is not nervous/anxious.   All other systems reviewed and are negative.    MEDICATIONS AT HOME:   Prior to Admission medications   Medication Sig Start Date End Date Taking? Authorizing Provider  albuterol (PROVENTIL HFA;VENTOLIN HFA) 108 (90 Base) MCG/ACT inhaler Inhale 2 puffs into the lungs every 6 (six) hours as needed for wheezing or shortness of breath.   Yes Historical Provider, MD  aspirin (ASPIRIN EC) 81 MG EC tablet Take 81 mg by mouth daily. Swallow whole.   Yes Historical Provider, MD  Calcium Carbonate-Vit D-Min (CALCIUM-VITAMIN D-MINERALS) 600-400 MG-UNIT CHEW Chew 2 tablets by mouth 2 (two) times daily.    Yes Historical Provider, MD  DULoxetine (CYMBALTA) 30 MG capsule Take 30 mg by mouth at bedtime.    Yes Historical Provider, MD  DULoxetine (CYMBALTA) 60 MG capsule Take 60 mg by mouth every morning.   Yes Historical Provider, MD  etanercept (ENBREL) 50 MG/ML injection Inject 50 mg into the skin once a week. Friday   Yes Historical Provider, MD  Fluticasone Furoate-Vilanterol (BREO ELLIPTA) 100-25 MCG/INH AEPB Inhale 1 puff into the lungs every morning.   Yes Historical Provider, MD  gabapentin (NEURONTIN) 300 MG capsule Take 600 mg by mouth 3 (three) times daily as needed (leg pain).    Yes Historical Provider, MD  ibuprofen (ADVIL,MOTRIN) 200 MG tablet Take 400 mg by mouth every 6 (six) hours as needed for mild pain.   Yes Historical Provider, MD  ipratropium-albuterol (DUONEB) 0.5-2.5 (3) MG/3ML SOLN Take 3 mLs by nebulization every 6 (six) hours as needed (for shortness of breath).    Yes Historical Provider, MD  Menthol-Methyl Salicylate (BEN GAY ARTHRITIS FORMULA) 8-30 % CREA Apply 1 application topically 2 (two) times daily as needed (for back pain).    Yes  Historical Provider, MD  Methotrexate, PF, 25 MG/0.4ML SOAJ Inject 50 mg into the skin once a week. Friday   Yes Historical Provider, MD  omeprazole (PRILOSEC) 20 MG capsule Take 20 mg by mouth 2 (two) times daily.    Yes Historical Provider, MD  oxyCODONE-acetaminophen (PERCOCET) 10-325 MG tablet Take 1 tablet by mouth every 6 (six) hours as needed for pain. 09/08/15  Yes Houston Siren, MD  potassium chloride (K-DUR) 10 MEQ tablet Take 1 tablet (10 mEq total) by mouth daily. 10/20/15  Yes Sharman Cheek, MD  QUEtiapine (SEROQUEL) 100 MG tablet Take 1 tablet (100 mg total) by mouth at bedtime. 09/08/15  Yes Houston Siren, MD  vitamin C (ASCORBIC ACID) 500 MG tablet Take 500 mg by mouth daily.   Yes Historical Provider, MD  zoledronic acid (RECLAST) 5 MG/100ML SOLN injection Inject 5 mg into the vein See admin instructions. *uses once  a year*   Yes Historical Provider, MD  divalproex (DEPAKOTE SPRINKLE) 125 MG capsule Take 125 mg by mouth 2 (two) times daily.    Historical Provider, MD  insulin aspart (NOVOLOG) 100 UNIT/ML injection Inject 0-5 Units into the skin at bedtime. 09/08/15   Houston Siren, MD  insulin aspart (NOVOLOG) 100 UNIT/ML injection Inject 0-15 Units into the skin 3 (three) times daily with meals. 09/08/15   Houston Siren, MD  insulin glargine (LANTUS) 100 UNIT/ML injection Inject 0.1 mLs (10 Units total) into the skin daily. 09/08/15   Houston Siren, MD  traZODone (DESYREL) 50 MG tablet Take 50-100 mg by mouth at bedtime as needed for sleep.     Historical Provider, MD      VITAL SIGNS:  Blood pressure 120/58, pulse 84, temperature 97.6 F (36.4 C), temperature source Oral, resp. rate 20, height  (1.575 m), weight 60.782 kg (134 lb), SpO2 98 %.  PHYSICAL EXAMINATION:  GENERAL:  70 y.o.-year-old patient lying in the bed with no acute distress. Patient is taking cachectic EYES: Pupils equal, round, reactive to light and accommodation. No scleral icterus. Extraocular  muscles intact.  HEENT: Head atraumatic, normocephalic. Oropharynx and nasopharynx clear.  NECK:  Supple, no jugular venous distention. No thyroid enlargement, no tenderness.  LUNGS: Normal breath sounds bilaterally, no wheezing, rales,rhonchi or crepitation. No use of accessory muscles of respiration.  CARDIOVASCULAR: S1, S2 normal. No murmurs, rubs, or gallops.  ABDOMEN: Soft, nontender, nondistended. Bowel sounds present. No organomegaly or mass.  EXTREMITIES: No pedal edema, cyanosis, or clubbing. She has several stages of bruising and both upper and lower extremity. Skin tears. No active bleeding. Tegaderm applied. NEUROLOGIC: Cranial nerves II through XII are intact. Muscle strength 5/5 in all extremities. Sensation intact. Gait not checked. Subjectively weak PSYCHIATRIC: The patient is alert and oriented x 3.  SKIN: No obvious rash, lesion, or ulcer.   LABORATORY PANEL:   CBC  Recent Labs Lab 11/10/15 0005  WBC 7.1  HGB 10.1*  HCT 30.7*  PLT 482*   ------------------------------------------------------------------------------------------------------------------  Chemistries   Recent Labs Lab 11/10/15 0005  NA 139  K 2.7*  CL 105  CO2 27  GLUCOSE 144*  BUN 19  CREATININE 0.62  CALCIUM 8.4*   ------------------------------------------------------------------------------------------------------------------  Cardiac Enzymes  Recent Labs Lab 11/10/15 0005  TROPONINI <0.03   ------------------------------------------------------------------------------------------------------------------  RADIOLOGY:  No results found.  EKG:    IMPRESSION AND PLAN:   Britanny Marksberry  is a 70 y.o. female with a known history of major depression, recurrent distant history of acute psychosis where she was admitted at geriatric psych unit in [facility], hypertension, COPD with ongoing tobacco abuse, rheumatoid arthritis, chronic low back pain comes to the emergency room after  she had fallen on her deck causing injury/skin tear on her right lower extremity.  1. Syncopal episode/acute fall at home. No precipitating event. Etiology unclear. Appears more mechanical fall Admit to telemetry Patient appears to be in sinus rhythm at present. No history of any cardiac issues in the past IV fluids Replete potassium Cycle cardiac enzymes 3 Physical therapy to see patient Patient gets therapy at home. She is supposed to use a walker however forgets most of the time per husband  2. COPD with ongoing tobacco abuse Smoking cessation advised 3 minutes spent Continue inhalers and DuoNeb nebs as needed. Continue oxygen.  3. Type 2 diabetes not on any medication. She was recently taken off her insulin We'll continue sliding scale  4.  Major recurrent depression with acute psychosis. Patient was just recently discharged from geropsychiatric unit in [facility] Continue her psych meds  5. History of rheumatoid arthritis with chronic DJD and chronic pain Continue her DMARDS She follows with Dr. Barb Merino  6. DVT prophylaxis I will do SCD and teds. Patient has a lot of bruises in both upper and lower extremity and she is very easy to bruise and therefore will avoid antiplatelet agents.    All the records are reviewed and case discussed with ED provider. Management plans discussed with the patient, family and they are in agreement.  CODE STATUS: Full  TOTAL TIME TAKING CARE OF THIS PATIENT: 50 minutes.    Esabella Stockinger M.D on 11/10/2015 at 1:53 AM  Between 7am to 6pm - Pager - [phone]  After 6pm go to [email or web address] - password [email or web address] [facility]  [place] [facility]  Office  [phone]  CC: Primary care physician; Lauro Regulus., MD

## 2015-11-10 NOTE — ED Notes (Signed)
Pt's son cherylynn liszewski, left cell phone #, [phone]

## 2015-11-10 NOTE — Progress Notes (Signed)
Telephone report called to Ladona Ridgel, RN on 2A.  Patient to be moved to room 246.

## 2015-11-10 NOTE — ED Notes (Signed)
Dr. Allena Katz informed of pt's pain, per Dr. Allena Katz, cannot give more pain medication at this time

## 2015-11-10 NOTE — ED Notes (Signed)
Skin tears rinsed with sterile saline and dressed with tegaderm per dr. Manson Passey request

## 2015-11-11 ENCOUNTER — Inpatient Hospital Stay: Payer: Medicare Other

## 2015-11-11 LAB — GLUCOSE, CAPILLARY
GLUCOSE-CAPILLARY: 130 mg/dL — AB (ref 65–99)
Glucose-Capillary: 104 mg/dL — ABNORMAL HIGH (ref 65–99)

## 2015-11-11 MED ORDER — ASPIRIN 325 MG PO TBEC: 325.0000 mg | DELAYED_RELEASE_TABLET | Freq: Every day | ORAL | Status: DC

## 2015-11-11 MED ORDER — ASPIRIN EC 325 MG PO TBEC
325.0000 mg | DELAYED_RELEASE_TABLET | Freq: Every day | ORAL | Status: DC
  Administered 2015-11-11: 325 mg via ORAL
  Filled 2015-11-11: qty 1

## 2015-11-11 MED ORDER — ACETAMINOPHEN 325 MG PO TABS: 650.0000 mg | ORAL_TABLET | Freq: Four times a day (QID) | ORAL | Status: DC | PRN

## 2015-11-11 MED ORDER — QUETIAPINE FUMARATE 100 MG PO TABS: 150.0000 mg | ORAL_TABLET | Freq: Every day | ORAL | Status: DC

## 2015-11-11 MED ORDER — OXYCODONE-ACETAMINOPHEN 10-325 MG PO TABS: 1.0000 | ORAL_TABLET | Freq: Four times a day (QID) | ORAL | Status: DC | PRN

## 2015-11-11 NOTE — Progress Notes (Signed)
A&O. Up with assist. Medicated for pain during the night. Slept well. On RA. No s/s distress.

## 2015-11-11 NOTE — Care Management Note (Signed)
Case Management Note  Patient Details  Name: Michelle Cabrera MRN: [id number] Date of Birth: 19-Aug-1945  Subjective/Objective:        Call to Michelle Cabrera at [place] updating her about the resumption of care order for home health PT and RN with Michelle Cabrera.           Action/Plan:   Expected Discharge Date:                  Expected Discharge Plan:     In-House Referral:     Discharge planning Services     Post Acute Care Choice:    Choice offered to:     DME Arranged:    DME Agency:     HH Arranged:    HH Agency:     Status of Service:     Medicare Important Message Given:    Date Medicare IM Given:    Medicare IM give by:    Date Additional Medicare IM Given:    Additional Medicare Important Message give by:     If discussed at Long Length of Stay Meetings, dates discussed:    Additional Comments:  Michelle Holquin A, RN 11/11/2015, 12:26 PM

## 2015-11-11 NOTE — Discharge Instructions (Signed)
Activity as tolerated per home health PT Diet cardiac Follow-up with primary care physician in a week and PCP to follow up on the pending carotid Doppler report and to get outpatient echocardiogram Outpatient follow-up with Dr. Sherryll Burger urology in a week Outpatient follow-up with psychiatry as scheduled on 11/13/2015

## 2015-11-11 NOTE — Progress Notes (Signed)
MD notified. Pt's speech is showing signs of expressive aphasia. Pt is weaker on the left side per neuro check with no other signs and symptoms present. MD will see patient and orders to neuro check every 2 hours and an aspirin added to scheduled medication. I will continue to assess.

## 2015-11-11 NOTE — Discharge Summary (Addendum)
[facility] - [place] at [facility]   PATIENT NAME: Michelle Cabrera    MR#:  [id number]  DATE OF BIRTH:  24-Jul-1946  DATE OF ADMISSION:  11/09/2015 ADMITTING PHYSICIAN: Ihor Austin, MD  DATE OF DISCHARGE:11/11/15 PRIMARY CARE PHYSICIAN: Lauro Regulus., MD    ADMISSION DIAGNOSIS:  [facility] l Arm Scapala Pain  DISCHARGE DIAGNOSIS:  Active Problems:   Syncope   Hypotension   SECONDARY DIAGNOSIS:   Past Medical History  Diagnosis Date  . Hypertension   . Closed compression fracture of L1 lumbar vertebra (HCC) 11/2012  . COPD (chronic obstructive pulmonary disease) (HCC)   . Anxiety   . Depression   . GERD (gastroesophageal reflux disease)   . Rheumatoid arthritis (HCC)   . DDD (degenerative disc disease)   . Chronic lower back pain   . Collagen vascular disease [facility])     HOSPITAL COURSE:   Michelle Cabrera is a 70 y.o. female with a known history of major depression, recurrent distant history of acute psychosis where she was admitted at geriatric psych unit in [facility], hypertension, COPD with ongoing tobacco abuse, rheumatoid arthritis, chronic low back pain comes to the emergency room after she had fallen on her deck causing injury/skin tear on her right lower extremity.  #. Syncopal episode/acute fall at home. No precipitating event. Appears more mechanical fall Monitor patient on telemetry, no overnight events Patient appears to be in sinus rhythm at present. No history of any cardiac issues in the past IV fluids given, quickly improved Check orthostatics-negative Cycle cardiac enzymes 3 -negative troponins Physical therapy -recommended home health PT Patient gets therapy at home. She is supposed to use a walker however forgets most of the time per husband  #Transient dysarthria?/Psych related  Symptoms are completely resolved CT head is negative, patient and daughter refused repeat CT Carotid Dopplers are ordered and result  is pending PCP to follow up on it Outpatient echocardiogram is recommended, PCP can order it Increase her aspirin to 325 mg by mouth once daily and follow up with outpatient neurology.  Patient is allergic to statin PT is recommending home health PT Outpatient follow-up with psychiatry as scheduled on Monday, 11/13/2015   #Hypokalemia Replete potassium, repeat potassium level at 1300 today and BMP in a.m.  #Left shoulder and left elbow pain status post fall Will provide pain management as needed basis and x-rays are negative PT recommended home health PT  #. COPD with ongoing tobacco abuse Smoking cessation advised 3 minutes spent, on nicotine patch Continue inhalers and DuoNeb nebs as needed. Continue oxygen.  3. Type 2 diabetes not on any medication. She was recently taken off her insulin We'll continue sliding scale  4. Major recurrent depression with acute psychosis. Patient was just recently discharged from geropsychiatric unit in [facility] Continue her psych meds Outpatient follow-up with psychiatry as scheduled on 11/13/2015  5. History of rheumatoid arthritis with chronic DJD and chronic pain Continue her DMARDS, patient can use her home medications as they're very expensive our pharmacy cannot provide She follows with Dr. Barb Merino  6. DVT prophylaxis SCD and teds. Patient has a lot of bruises in both upper and lower extremity and she is very easy to bruise and therefore will avoid antiplatelet agents.   DISCHARGE CONDITIONS:    fair  CONSULTS OBTAINED:      PROCEDURES  none  DRUG ALLERGIES:   Allergies  Allergen Reactions  . Gold-Containing Drug Products Anaphylaxis  . Adhesive [Tape]  Tears skin  . Dilaudid [Hydromorphone Hcl]     Made tongue swell  . Pravastatin     Arms and legs hurt     DISCHARGE MEDICATIONS:   Current Discharge Medication List    START taking these medications   Details  acetaminophen (TYLENOL) 325 MG tablet Take 2  tablets (650 mg total) by mouth every 6 (six) hours as needed for mild pain (or Fever >/= 101).      CONTINUE these medications which have CHANGED   Details  aspirin EC 325 MG EC tablet Take 1 tablet (325 mg total) by mouth daily. Qty: 30 tablet, Refills: 0    oxyCODONE-acetaminophen (PERCOCET) 10-325 MG tablet Take 1 tablet by mouth every 6 (six) hours as needed for pain. Qty: 30 tablet, Refills: 0    QUEtiapine (SEROQUEL) 100 MG tablet Take 1.5 tablets (150 mg total) by mouth at bedtime. Qty: 60 tablet, Refills: 0      CONTINUE these medications which have NOT CHANGED   Details  albuterol (PROVENTIL HFA;VENTOLIN HFA) 108 (90 Base) MCG/ACT inhaler Inhale 2 puffs into the lungs every 6 (six) hours as needed for wheezing or shortness of breath.    Calcium Carbonate-Vit D-Min (CALCIUM-VITAMIN D-MINERALS) 600-400 MG-UNIT CHEW Chew 1 tablet by mouth 2 (two) times daily.     DULoxetine (CYMBALTA) 60 MG capsule Take 60 mg by mouth every morning.    etanercept (ENBREL) 50 MG/ML injection Inject 50 mg into the skin once a week. Friday    Fluticasone Furoate-Vilanterol (BREO ELLIPTA) 100-25 MCG/INH AEPB Inhale 1 puff into the lungs every morning.    folic acid (FOLVITE) 1 MG tablet Take 1 mg by mouth daily.    gabapentin (NEURONTIN) 300 MG capsule Take 300 mg by mouth 3 (three) times daily as needed (leg pain).     ibuprofen (ADVIL,MOTRIN) 200 MG tablet Take 400 mg by mouth every 6 (six) hours as needed for mild pain.    ipratropium-albuterol (DUONEB) 0.5-2.5 (3) MG/3ML SOLN Take 3 mLs by nebulization every 6 (six) hours as needed (for shortness of breath).     Methotrexate, PF, 25 MG/0.4ML SOAJ Inject 20 mg into the skin once a week. Friday    metoprolol tartrate (LOPRESSOR) 25 MG tablet Take 25 mg by mouth 2 (two) times daily.    nicotine (NICODERM CQ - DOSED IN MG/24 HOURS) 14 mg/24hr patch Place 14 mg onto the skin daily.    omeprazole (PRILOSEC) 20 MG capsule Take 20 mg by mouth  2 (two) times daily.     potassium chloride (K-DUR) 10 MEQ tablet Take 1 tablet (10 mEq total) by mouth daily. Qty: 7 tablet, Refills: 0    vitamin C (ASCORBIC ACID) 500 MG tablet Take 500 mg by mouth daily.    zoledronic acid (RECLAST) 5 MG/100ML SOLN injection Inject 5 mg into the vein See admin instructions. *uses once a year*      STOP taking these medications     oxyCODONE (OXY IR/ROXICODONE) 5 MG immediate release tablet      divalproex (DEPAKOTE SPRINKLE) 125 MG capsule      traZODone (DESYREL) 50 MG tablet          DISCHARGE INSTRUCTIONS:   Activity as tolerated per home health PT Diet cardiac Follow-up with primary care physician in a week and PCP to follow up on the pending carotid Doppler report and to get outpatient echocardiogram Outpatient follow-up with Dr. Sherryll Burger urology in a week Outpatient follow-up with psychiatry as scheduled on  11/13/2015   DIET:  Cardiac diet  DISCHARGE CONDITION:  Fair  ACTIVITY:  Activity as tolerated per Home health pt  OXYGEN:  Home Oxygen: No.   Oxygen Delivery: room air  DISCHARGE LOCATION:  home   If you experience worsening of your admission symptoms, develop shortness of breath, life threatening emergency, suicidal or homicidal thoughts you must seek medical attention immediately by calling 911 or calling your MD immediately  if symptoms less severe.  You Must read complete instructions/literature along with all the possible adverse reactions/side effects for all the Medicines you take and that have been prescribed to you. Take any new Medicines after you have completely understood and accpet all the possible adverse reactions/side effects.   Please note  You were cared for by a hospitalist during your hospital stay. If you have any questions about your discharge medications or the care you received while you were in the hospital after you are discharged, you can call the unit and asked to speak with the hospitalist  on call if the hospitalist that took care of you is not available. Once you are discharged, your primary care physician will handle any further medical issues. Please note that NO REFILLS for any discharge medications will be authorized once you are discharged, as it is imperative that you return to your primary care physician (or establish a relationship with a primary care physician if you do not have one) for your aftercare needs so that they can reassess your need for medications and monitor your lab values.     Today  Chief Complaint  Patient presents with  . Chest Pain  . Fall   Patient is resting comfortably. This morning daughter and RN noticed transient dysarthria for a few minutes which is completely resolved. Patient refuses repeat CAT scan of the head and daughter thinks it's her psych related issue. Patient wants to be discharged home  ROS:  CONSTITUTIONAL: Denies fevers, chills. Denies any fatigue, weakness.  EYES: Denies blurry vision, double vision, eye pain. EARS, NOSE, THROAT: Denies tinnitus, ear pain, hearing loss. RESPIRATORY: Denies cough, wheeze, shortness of breath.  CARDIOVASCULAR: Denies chest pain, palpitations, edema.  GASTROINTESTINAL: Denies nausea, vomiting, diarrhea, abdominal pain. Denies bright red blood per rectum. GENITOURINARY: Denies dysuria, hematuria. ENDOCRINE: Denies nocturia or thyroid problems. HEMATOLOGIC AND LYMPHATIC: Denies easy bruising or bleeding. SKIN: Denies rash or lesion. MUSCULOSKELETAL: Denies pain in neck, back, shoulder, knees, hips or arthritic symptoms.  NEUROLOGIC: Denies paralysis, paresthesias.  PSYCHIATRIC: Denies anxiety or depressive symptoms.   VITAL SIGNS:  Blood pressure 136/72, pulse 80, temperature 98.4 F (36.9 C), temperature source Oral, resp. rate 20, height 5\' 2"  (1.575 m), weight 62.4 kg (137 lb 9.1 oz), SpO2 96 %.  I/O:    Intake/Output Summary (Last 24 hours) at 11/11/15 1356 Last data filed at  11/11/15 1306  Gross per 24 hour  Intake    240 ml  Output   1525 ml  Net  -1285 ml    PHYSICAL EXAMINATION:  GENERAL:  70 y.o.-year-old patient lying in the bed with no acute distress.  EYES: Pupils equal, round, reactive to light and accommodation. No scleral icterus. Extraocular muscles intact.  HEENT: Head atraumatic, normocephalic. Oropharynx and nasopharynx clear.  NECK:  Supple, no jugular venous distention. No thyroid enlargement, no tenderness.  LUNGS: Normal breath sounds bilaterally, no wheezing, rales,rhonchi or crepitation. No use of accessory muscles of respiration.  CARDIOVASCULAR: S1, S2 normal. No murmurs, rubs, or gallops.  ABDOMEN: Soft, non-tender, non-distended.  Bowel sounds present. No organomegaly or mass.  EXTREMITIES: No pedal edema, cyanosis, or clubbing.  NEUROLOGIC: Cranial nerves II through XII are intact. Muscle strength 5/5 in all extremities. Sensation intact. Gait not checked.  PSYCHIATRIC: The patient is alert and oriented x 3. Denies any suicidal ideation or thoughts SKIN: No obvious rash, lesion, or ulcer.   DATA REVIEW:   CBC  Recent Labs Lab 11/10/15 0537  WBC 6.2  HGB 10.0*  HCT 30.0*  PLT 435    Chemistries   Recent Labs Lab 11/10/15 0005 11/10/15 0537 11/10/15 1256  NA 139  --   --   K 2.7*  --  4.4  CL 105  --   --   CO2 27  --   --   GLUCOSE 144*  --   --   BUN 19  --   --   CREATININE 0.62 0.59  --   CALCIUM 8.4*  --   --     Cardiac Enzymes  Recent Labs Lab 11/10/15 1656  TROPONINI <0.03    Microbiology Results  Results for orders placed or performed during the hospital encounter of 11/09/15  MRSA PCR Screening     Status: None   Collection Time: 11/10/15  4:56 AM  Result Value Ref Range Status   MRSA by PCR NEGATIVE NEGATIVE Final    Comment:        The GeneXpert MRSA Assay (FDA approved for NASAL specimens only), is one component of a comprehensive MRSA colonization surveillance program. It is  not intended to diagnose MRSA infection nor to guide or monitor treatment for MRSA infections.     RADIOLOGY:  Dg Chest 2 View  11/10/2015  CLINICAL DATA:  Larey Seat 3 feet off a back patio at home around 18:30. EXAM: CHEST  2 VIEW COMPARISON:  10/19/2015 FINDINGS: The heart size and mediastinal contours are within normal limits. Both lungs are clear. The visualized skeletal structures are unremarkable. Chronic fracture deformity of the right proximal humerus. Prior thoracic and upper lumbar vertebroplasties. IMPRESSION: No active cardiopulmonary disease. Electronically Signed   By: Ellery Plunk M.D.   On: 11/10/2015 02:34   Dg Lumbar Spine Complete  11/10/2015  CLINICAL DATA:  Larey Seat 3 feet off a back patio at home at 18:30 EXAM: LUMBAR SPINE - COMPLETE 4+ VIEW COMPARISON:  08/24/2014 FINDINGS: There is vertebroplasty at L1. There is posterior instrumented fusion with transpedicular hardware from L2 through L4, and an intervertebral fusion cage at L3-4. Mild chronic anterior wedging of L3 at L1. The other lumbar vertebrae are normal in height. No acute fracture is evident. No hardware failure is evident. Sacrum and sacroiliac joints appear unremarkable. IMPRESSION: Negative for acute lumbar spine fracture Electronically Signed   By: Ellery Plunk M.D.   On: 11/10/2015 02:36   Dg Elbow 2 Views Left  11/11/2015  CLINICAL DATA:  Initial encounter for Pt hospitalized post fall yesterday, c/o generalized left shoulder and left elbow pain, pt has full ROM and no obvious swelling. EXAM: LEFT ELBOW - 2 VIEW COMPARISON:  None. FINDINGS: No acute fracture or dislocation. No joint effusion. There may be mild soft tissue swelling about the olecranon on the lateral view IMPRESSION: No acute osseous abnormality. Electronically Signed   By: Jeronimo Greaves M.D.   On: 11/11/2015 11:59   Ct Head Wo Contrast  11/10/2015  CLINICAL DATA:  Larey Seat at home around 18:30, approximately 3 feet off a back patio. EXAM: CT HEAD  WITHOUT CONTRAST TECHNIQUE: Contiguous axial  images were obtained from the base of the skull through the vertex without intravenous contrast. COMPARISON:  10/19/2015 FINDINGS: There is no intracranial hemorrhage. There are subdural hygromas bilaterally, unchanged. Mild generalized atrophy, unchanged. Gray-white differentiation is preserved. No acute abnormality is evident. The calvarium and skullbase are intact. IMPRESSION: Negative for acute intracranial traumatic injury. Bilateral frontal subdural hygromas. Generalized atrophy. Electronically Signed   By: Ellery Plunk M.D.   On: 11/10/2015 02:16   Dg Shoulder Left  11/11/2015  CLINICAL DATA:  Pt hospitalized post fall yesterday, c/o generalized left shoulder and left elbow pain, pt has full ROM and no obvious swelling. EXAM: LEFT SHOULDER - 2+ VIEW COMPARISON:  None. FINDINGS: No acute fracture or dislocation. Joint spaces are maintained for age. IMPRESSION: No acute osseous abnormality. Electronically Signed   By: Jeronimo Greaves M.D.   On: 11/11/2015 11:58    EKG:   Orders placed or performed during the hospital encounter of 11/09/15  . EKG 12-Lead  . EKG 12-Lead  . ED EKG within 10 minutes  . ED EKG within 10 minutes      Management plans discussed with the patient, family and they are in agreement.  CODE STATUS:     Code Status Orders        Start     Ordered   11/10/15 0458  Full code   Continuous     11/10/15 0458    Code Status History    Date Active Date Inactive Code Status Order ID Comments User Context   09/06/2015  4:04 AM 09/08/2015  8:04 PM Full Code [id number]  Ihor Austin, MD ED   07/26/2015  3:51 PM 08/01/2015  4:32 PM Full Code [id number]  Shaune Pollack, MD Inpatient   09/15/2014  6:36 PM 09/16/2014  5:22 PM Full Code [id number]  Dominica Severin, MD Inpatient   02/07/2014 11:04 AM 02/07/2014 10:39 PM Full Code [id number]  Hewitt Shorts, MD Inpatient   12/26/2012 11:54 PM 01/05/2013  3:34 PM Full Code [id number]  Ron Parker, MD ED      TOTAL TIME TAKING CARE OF THIS PATIENT: 45  minutes.    @[facility] @  on 11/11/2015 at 1:56 PM  Between 7am to 6pm - Pager - [phone]  After 6pm go to [email or web address] - password [email or web address] [facility]  [facility]  Office  [phone]  CC: Primary care physician; [phone]., MD

## 2015-11-13 ENCOUNTER — Ambulatory Visit (INDEPENDENT_AMBULATORY_CARE_PROVIDER_SITE_OTHER): Payer: Medicare Other | Admitting: Licensed Clinical Social Worker

## 2015-11-13 ENCOUNTER — Encounter: Payer: Self-pay | Admitting: Licensed Clinical Social Worker

## 2015-11-13 DIAGNOSIS — F29 Unspecified psychosis not due to a substance or known physiological condition: Secondary | ICD-10-CM | POA: Diagnosis not present

## 2015-11-13 DIAGNOSIS — F2089 Other schizophrenia: Secondary | ICD-10-CM
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
## 2015-11-21 NOTE — ED Provider Notes (Signed)
[facility] Emergency Department Provider Note  ____________________________________________  Time seen: 12:20 AM  I have reviewed the triage vital signs and the nursing notes.   HISTORY  Chief Complaint Chest Pain and Fall     HPI Michelle Cabrera is a 70 y.o. female presents via EMS from home status post syncopal episode approximately 6:30 PM off her back porch approximately 3 feet high. Patient denies any preceding symptoms now admits to left-sided chest pain with radiation to her left shoulder described as heaviness patient denies any dyspnea no dizziness or headache at this time. Patient denies any weakness or numbness. Current pain score 6 out of 10   Past Medical History  Diagnosis Date  . Hypertension   . Closed compression fracture of L1 lumbar vertebra (HCC) 11/2012  . COPD (chronic obstructive pulmonary disease) (HCC)   . Anxiety   . Depression   . GERD (gastroesophageal reflux disease)   . Rheumatoid arthritis (HCC)   . DDD (degenerative disc disease)   . Chronic lower back pain   . Collagen vascular disease [facility])     Patient Active Problem List   Diagnosis Date Noted  . Schizophrenia spectrum disorder with psychotic disorder type not yet determined (HCC) 11/13/2015  . Syncope 11/10/2015  . Hypotension 11/10/2015  . 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 Pressure ulcer 09/08/2015  . New onset type 2 diabetes mellitus (HCC) 09/06/2015  . Altered mental status 09/06/2015  . COPD exacerbation (HCC) 07/26/2015  . Arthritis associated with another disorder 09/15/2014  . Wedge compression fracture of T6 vertebra (HCC) 02/07/2014  . Intractable low back pain 12/27/2012  . DDD (degenerative disc disease) 12/27/2012  . Rheumatoid arthritis (HCC) 12/27/2012  . Hypertension 12/27/2012    Past Surgical History  Procedure Laterality Date  . Lumbar disc surgery       X 3  . Kyphoplasty N/A 01/04/2013    Procedure: L1 Kyphoplasty;  Surgeon: Hewitt Shorts, MD;  Location: [facility] NEURO ORS;  Service: Neurosurgery;  Laterality: N/A;  L1 Kyphoplasty  . Tubal ligation    . Foot surgery    . Wrist fusion Right     30 yrs ago  . Kyphoplasty N/A 02/07/2014    Procedure: THORACIC SIX KYPHOPLASTY;  Surgeon: Hewitt Shorts, MD;  Location: [facility] NEURO ORS;  Service: Neurosurgery;  Laterality: N/A;  T6 Kyphoplasty   . Hand tendon surgery Right 09/15/2014    "d/t RA"  . Tonsillectomy    . Posterior lumbar fusion      "got screws in"  . Back surgery    . Cataract extraction w/ intraocular lens  implant, bilateral Bilateral   . Finger arthroplasty Right 09/15/2014    Procedure: RIGHT MIDDLE FINGER, RING FINGER, SMALL FINGER EXTENSOR DIGITORUM COMMUMIS STABILIZATION WITH RIGHT MIDDLE FINGER MCP ARTHROPLASTY, POSSIBLE RING FINGER AND SMALL FINGER MCP ARTHROPLASTY;  Surgeon: Dominica Severin, MD;  Location: [facility] OR;  Service: Orthopedics;  Laterality: Right;  . Breast biopsy Left     negative    Current Outpatient Rx  Name  Route  Sig  Dispense  Refill  . albuterol (PROVENTIL HFA;VENTOLIN HFA) 108 (90 Base) MCG/ACT inhaler   Inhalation   Inhale 2 puffs into the lungs every 6 (six) hours as needed for wheezing or shortness of breath.         . Calcium Carbonate-Vit D-Min (CALCIUM-VITAMIN D-MINERALS) 600-400 MG-UNIT CHEW   Oral   Chew 1 tablet by mouth 2 (two) times daily.          Marland Kitchen  DULoxetine (CYMBALTA) 60 MG capsule   Oral   Take 60 mg by mouth daily.          Marland Kitchen etanercept (ENBREL) 50 MG/ML injection   Subcutaneous   Inject 50 mg into the skin once a week. Friday         . Fluticasone Furoate-Vilanterol (BREO ELLIPTA) 100-25 MCG/INH AEPB   Inhalation   Inhale 1 puff into the lungs every morning.         . folic acid (FOLVITE) 1 MG tablet   Oral   Take 1 mg by mouth daily.         Marland Kitchen gabapentin (NEURONTIN) 300 MG capsule   Oral   Take 300 mg by mouth 3 (three) times daily as needed (leg pain).          Marland Kitchen ibuprofen  (ADVIL,MOTRIN) 200 MG tablet   Oral   Take 400 mg by mouth every 6 (six) hours as needed for mild pain.         Marland Kitchen ipratropium-albuterol (DUONEB) 0.5-2.5 (3) MG/3ML SOLN   Nebulization   Take 3 mLs by nebulization every 6 (six) hours as needed (for shortness of breath).          . Methotrexate, PF, 25 MG/0.4ML SOAJ   Subcutaneous   Inject 20 mg into the skin once a week. Friday         . metoprolol tartrate (LOPRESSOR) 25 MG tablet   Oral   Take 25 mg by mouth 2 (two) times daily.         . nicotine (NICODERM CQ - DOSED IN MG/24 HOURS) 14 mg/24hr patch   Transdermal   Place 14 mg onto the skin daily.         Marland Kitchen omeprazole (PRILOSEC) 20 MG capsule   Oral   Take 20 mg by mouth 2 (two) times daily.          . potassium chloride (K-DUR) 10 MEQ tablet   Oral   Take 1 tablet (10 mEq total) by mouth daily.   7 tablet   0   . vitamin C (ASCORBIC ACID) 500 MG tablet   Oral   Take 500 mg by mouth daily.         . zoledronic acid (RECLAST) 5 MG/100ML SOLN injection   Intravenous   Inject 5 mg into the vein See admin instructions. *uses once a year*         . acetaminophen (TYLENOL) 325 MG tablet   Oral   Take 2 tablets (650 mg total) by mouth every 6 (six) hours as needed for mild pain (or Fever >/= 101).         Marland Kitchen aspirin EC 325 MG EC tablet   Oral   Take 1 tablet (325 mg total) by mouth daily. Patient taking differently: Take 81 mg by mouth daily.    30 tablet   0   . divalproex (DEPAKOTE) 125 MG DR tablet   Oral   Take 125 mg by mouth 2 (two) times daily.         . DULoxetine (CYMBALTA) 30 MG capsule   Oral   Take 30 mg by mouth daily.         Marland Kitchen oxyCODONE-acetaminophen (PERCOCET) 10-325 MG tablet   Oral   Take 1 tablet by mouth every 6 (six) hours as needed for pain.   30 tablet   0   . QUEtiapine (SEROQUEL) 100 MG tablet   Oral  Take 1.5 tablets (150 mg total) by mouth at bedtime. Patient taking differently: Take 75 mg by mouth at  bedtime.    60 tablet   0     Allergies Gold-containing drug products; Adhesive; Dilaudid; and Pravastatin  Family History  Problem Relation Age of Onset  . CAD Father   . Alcohol abuse Father   . CAD Mother   . Hypertension Mother   . Peripheral vascular disease Mother   . Breast cancer Maternal Aunt 50  . Alcohol abuse Sister     Social History Social History  Substance Use Topics  . Smoking status: Former Smoker -- 1.00 packs/day for 53 years    Types: Cigarettes    Quit date: 07/25/2015  . Smokeless tobacco: Former Neurosurgeon    Quit date: 07/15/2015  . Alcohol Use: No    Review of Systems  Constitutional: Negative for fever. Eyes: Negative for visual changes. ENT: Negative for sore throat. Cardiovascular: Negative for chest pain. Respiratory: Negative for shortness of breath. Gastrointestinal: Negative for abdominal pain, vomiting and diarrhea. Genitourinary: Negative for dysuria. Musculoskeletal: Negative for back pain. Skin: Negative for rash. Neurological: Negative for headaches, focal weakness or numbness.   10-point ROS otherwise negative.  ____________________________________________   PHYSICAL EXAM:  VITAL SIGNS: ED Triage Vitals  Enc Vitals Group     BP 11/09/15 2358 114/61 mmHg     Pulse Rate 11/09/15 2358 82     Resp 11/09/15 2358 17     Temp 11/09/15 2358 97.6 F (36.4 C)     Temp Source 11/09/15 2358 Oral     SpO2 11/09/15 2358 97 %     Weight 11/09/15 2358 134 lb (60.782 kg)     Height 11/09/15 2358 5\' 2"  (1.575 m)     Head Cir --      Peak Flow --      Pain Score 11/09/15 2359 6     Pain Loc --      Pain Edu? --      Excl. in GC? --     Constitutional: Alert and oriented. Well appearing and in no distress. Eyes: Conjunctivae are normal. PERRL. Normal extraocular movements. ENT   Head: Normocephalic and atraumatic.   Nose: No congestion/rhinnorhea.   Mouth/Throat: Mucous membranes are moist.   Neck: No  stridor. Hematological/Lymphatic/Immunilogical: No cervical lymphadenopathy. Cardiovascular: Normal rate, regular rhythm. Normal and symmetric distal pulses are present in all extremities. No murmurs, rubs, or gallops. Respiratory: Normal respiratory effort without tachypnea nor retractions. Breath sounds are clear and equal bilaterally. No wheezes/rales/rhonchi. Gastrointestinal: Soft and nontender. No distention. There is no CVA tenderness. Genitourinary: deferred Musculoskeletal: Nontender with normal range of motion in all extremities. No joint effusions.  No lower extremity tenderness nor edema. Neurologic:  Normal speech and language. No gross focal neurologic deficits are appreciated. Speech is normal.  Skin:  Bilateral lower extremity skin tears Psychiatric: Mood and affect are normal. Speech and behavior are normal. Patient exhibits appropriate insight and judgment.  ____________________________________________    LABS (pertinent positives/negatives)  Labs Reviewed  BASIC METABOLIC PANEL - Abnormal; Notable for the following:    Potassium 2.7 (*)    Glucose, Bld 144 (*)    Calcium 8.4 (*)    All other components within normal limits  CBC - Abnormal; Notable for the following:    RBC 3.16 (*)    Hemoglobin 10.1 (*)    HCT 30.7 (*)    RDW 15.4 (*)    Platelets 482 (*)  All other components within normal limits  CBC - Abnormal; Notable for the following:    RBC 3.17 (*)    Hemoglobin 10.0 (*)    HCT 30.0 (*)    RDW 15.5 (*)    All other components within normal limits  GLUCOSE, CAPILLARY - Abnormal; Notable for the following:    Glucose-Capillary 100 (*)    All other components within normal limits  GLUCOSE, CAPILLARY - Abnormal; Notable for the following:    Glucose-Capillary 141 (*)    All other components within normal limits  GLUCOSE, CAPILLARY - Abnormal; Notable for the following:    Glucose-Capillary 104 (*)    All other components within normal limits   GLUCOSE, CAPILLARY - Abnormal; Notable for the following:    Glucose-Capillary 130 (*)    All other components within normal limits  MRSA PCR SCREENING  TROPONIN I  CREATININE, SERUM  TROPONIN I  TROPONIN I  TROPONIN I  GLUCOSE, CAPILLARY  POTASSIUM  GLUCOSE, CAPILLARY     ____________________________________________   EKG  ED ECG REPORT I, [employer] N BROWN, the attending physician, personally viewed and interpreted this ECG.   Date: 11/21/2015  EKG Time: 11:58 PM  Rate: 81  Rhythm: Normal sinus rhythm  Axis: Normal  Intervals: Normal  ST&T Change: None   ____________________________________________    RADIOLOGY     CT Head Wo Contrast (Final result) Result time: 11/10/15 02:16:03   Final result by Rad Results In Interface (11/10/15 02:16:03)   Narrative:   CLINICAL DATA: Fell at home around 18:30, approximately 3 feet off a back patio.  EXAM: CT HEAD WITHOUT CONTRAST  TECHNIQUE: Contiguous axial images were obtained from the base of the skull through the vertex without intravenous contrast.  COMPARISON: 10/19/2015  FINDINGS: There is no intracranial hemorrhage. There are subdural hygromas bilaterally, unchanged. Mild generalized atrophy, unchanged. Gray-white differentiation is preserved. No acute abnormality is evident. The calvarium and skullbase are intact.  IMPRESSION: Negative for acute intracranial traumatic injury. Bilateral frontal subdural hygromas. Generalized atrophy.   Electronically Signed By: Ellery Plunk M.D. On: 11/10/2015 02:16            INITIAL IMPRESSION / ASSESSMENT AND PLAN / ED COURSE  Pertinent labs & imaging results that were available during my care of the patient were reviewed by me and considered in my medical decision making (see chart for details).  Given history of chest pain with syncopal episode will admit the patient for further evaluation and  management  ____________________________________________   FINAL CLINICAL IMPRESSION(S) / ED DIAGNOSES  Final diagnoses:  TIA (transient ischemic attack)  Shoulder pain, acute, left  Elbow pain, left      Darci Current, MD 11/21/15 [phone]

## 2015-11-27 ENCOUNTER — Other Ambulatory Visit: Payer: Self-pay | Admitting: Neurology

## 2015-11-27 DIAGNOSIS — H8113 Benign paroxysmal vertigo, bilateral: Secondary | ICD-10-CM

## 2015-11-27 DIAGNOSIS — W19XXXA Unspecified fall, initial encounter: Secondary | ICD-10-CM

## 2015-11-27 DIAGNOSIS — R4182 Altered mental status, unspecified: Secondary | ICD-10-CM

## 2015-11-29 ENCOUNTER — Other Ambulatory Visit: Payer: Self-pay | Admitting: Vascular Surgery

## 2015-12-07 ENCOUNTER — Ambulatory Visit: Payer: Medicare Other

## 2015-12-07 ENCOUNTER — Ambulatory Visit: Payer: Medicare Other | Admitting: Psychiatry

## 2015-12-07 ENCOUNTER — Encounter
Admission: RE | Disposition: A | Discharge status: Home / Self Care | Payer: Self-pay | Source: Ambulatory Visit | Attending: Vascular Surgery

## 2015-12-07 ENCOUNTER — Ambulatory Visit
Admission: RE | Admit: 2015-12-07 | Discharge: 2015-12-07 | Disposition: A | Discharge status: Home / Self Care | Payer: Medicare Other | Source: Ambulatory Visit | Attending: Vascular Surgery | Admitting: Vascular Surgery

## 2015-12-07 DIAGNOSIS — Z9889 Other specified postprocedural states: Secondary | ICD-10-CM | POA: Insufficient documentation

## 2015-12-07 DIAGNOSIS — Z7982 Long term (current) use of aspirin: Secondary | ICD-10-CM | POA: Diagnosis not present

## 2015-12-07 DIAGNOSIS — I708 Atherosclerosis of other arteries: Secondary | ICD-10-CM | POA: Insufficient documentation

## 2015-12-07 DIAGNOSIS — Z6823 Body mass index (BMI) 23.0-23.9, adult: Secondary | ICD-10-CM | POA: Insufficient documentation

## 2015-12-07 DIAGNOSIS — R42 Dizziness and giddiness: Secondary | ICD-10-CM | POA: Diagnosis not present

## 2015-12-07 DIAGNOSIS — Z8249 Family history of ischemic heart disease and other diseases of the circulatory system: Secondary | ICD-10-CM | POA: Diagnosis not present

## 2015-12-07 DIAGNOSIS — I10 Essential (primary) hypertension: Secondary | ICD-10-CM | POA: Diagnosis not present

## 2015-12-07 DIAGNOSIS — Z888 Allergy status to other drugs, medicaments and biological substances status: Secondary | ICD-10-CM | POA: Insufficient documentation

## 2015-12-07 DIAGNOSIS — I6523 Occlusion and stenosis of bilateral carotid arteries: Secondary | ICD-10-CM | POA: Diagnosis not present

## 2015-12-07 DIAGNOSIS — Z79899 Other long term (current) drug therapy: Secondary | ICD-10-CM | POA: Diagnosis not present

## 2015-12-07 DIAGNOSIS — I771 Stricture of artery: Secondary | ICD-10-CM | POA: Diagnosis not present

## 2015-12-07 DIAGNOSIS — M069 Rheumatoid arthritis, unspecified: Secondary | ICD-10-CM | POA: Diagnosis not present

## 2015-12-07 DIAGNOSIS — G458 Other transient cerebral ischemic attacks and related syndromes: Secondary | ICD-10-CM | POA: Diagnosis present

## 2015-12-07 DIAGNOSIS — M79609 Pain in unspecified limb: Secondary | ICD-10-CM | POA: Insufficient documentation

## 2015-12-07 DIAGNOSIS — Z885 Allergy status to narcotic agent status: Secondary | ICD-10-CM | POA: Insufficient documentation

## 2015-12-07 DIAGNOSIS — Z87891 Personal history of nicotine dependence: Secondary | ICD-10-CM | POA: Diagnosis not present

## 2015-12-07 DIAGNOSIS — J449 Chronic obstructive pulmonary disease, unspecified: Secondary | ICD-10-CM | POA: Diagnosis not present

## 2015-12-07 HISTORY — PX: PERIPHERAL VASCULAR CATHETERIZATION: SHX172C

## 2015-12-07 LAB — BUN: BUN: 18 mg/dL (ref 6–20)

## 2015-12-07 LAB — CREATININE, SERUM
Creatinine, Ser: 0.56 mg/dL (ref 0.44–1.00)
GFR calc non Af Amer: >60 mL/min (ref >60)

## 2015-12-07 SURGERY — UPPER EXTREMITY ANGIOGRAPHY
Anesthesia: Moderate Sedation | Laterality: Left

## 2015-12-07 MED ORDER — FENTANYL CITRATE (PF) 100 MCG/2ML IJ SOLN
INTRAMUSCULAR | Status: DC | PRN
  Administered 2015-12-07: 25 ug via INTRAVENOUS
  Administered 2015-12-07: 50 ug via INTRAVENOUS

## 2015-12-07 MED ORDER — ASPIRIN EC 81 MG PO TBEC: 81.0000 mg | DELAYED_RELEASE_TABLET | Freq: Every day | ORAL | Status: DC

## 2015-12-07 MED ORDER — SODIUM CHLORIDE 0.9 % IV SOLN
INTRAVENOUS | Status: DC
  Administered 2015-12-07: 10:00:00 via INTRAVENOUS

## 2015-12-07 MED ORDER — GUAIFENESIN-DM 100-10 MG/5ML PO SYRP: 15.0000 mL | ORAL_SOLUTION | ORAL | Status: DC | PRN

## 2015-12-07 MED ORDER — FENTANYL CITRATE (PF) 100 MCG/2ML IJ SOLN
INTRAMUSCULAR | Status: AC
  Filled 2015-12-07: qty 2

## 2015-12-07 MED ORDER — OXYCODONE-ACETAMINOPHEN 5-325 MG PO TABS
1.0000 | ORAL_TABLET | ORAL | Status: DC | PRN
  Administered 2015-12-07: 2 via ORAL

## 2015-12-07 MED ORDER — MIDAZOLAM HCL 5 MG/5ML IJ SOLN
INTRAMUSCULAR | Status: AC
  Filled 2015-12-07: qty 5

## 2015-12-07 MED ORDER — HEPARIN SODIUM (PORCINE) 1000 UNIT/ML IJ SOLN
INTRAMUSCULAR | Status: AC
  Filled 2015-12-07: qty 1

## 2015-12-07 MED ORDER — PHENOL 1.4 % MT LIQD: 1.0000 | OROMUCOSAL | Status: DC | PRN

## 2015-12-07 MED ORDER — OXYCODONE-ACETAMINOPHEN 5-325 MG PO TABS
ORAL_TABLET | ORAL | Status: AC
  Administered 2015-12-07: 2 via ORAL
  Filled 2015-12-07: qty 2

## 2015-12-07 MED ORDER — ONDANSETRON HCL 4 MG/2ML IJ SOLN: 4.0000 mg | Freq: Four times a day (QID) | INTRAMUSCULAR | Status: DC | PRN

## 2015-12-07 MED ORDER — HYDRALAZINE HCL 20 MG/ML IJ SOLN: 5.0000 mg | INTRAMUSCULAR | Status: DC | PRN

## 2015-12-07 MED ORDER — DEXTROSE 5 % IV SOLN
1.5000 g | INTRAVENOUS | Status: AC
  Administered 2015-12-07: 1.5 g via INTRAVENOUS

## 2015-12-07 MED ORDER — METHYLPREDNISOLONE SODIUM SUCC 125 MG IJ SOLR: 125.0000 mg | INTRAMUSCULAR | Status: DC | PRN

## 2015-12-07 MED ORDER — METOPROLOL TARTRATE 5 MG/5ML IV SOLN: 2.0000 mg | INTRAVENOUS | Status: DC | PRN

## 2015-12-07 MED ORDER — LIDOCAINE-EPINEPHRINE (PF) 1 %-1:200000 IJ SOLN
INTRAMUSCULAR | Status: DC | PRN
  Administered 2015-12-07: 10 mL via INTRADERMAL

## 2015-12-07 MED ORDER — FAMOTIDINE 20 MG PO TABS
40.0000 mg | ORAL_TABLET | ORAL | Status: DC | PRN
Start: 2015-12-07 — End: 2015-12-07

## 2015-12-07 MED ORDER — HEPARIN SODIUM (PORCINE) 1000 UNIT/ML IJ SOLN
INTRAMUSCULAR | Status: DC | PRN
  Administered 2015-12-07: 1500 [IU] via INTRAVENOUS
  Administered 2015-12-07: 3000 [IU] via INTRAVENOUS

## 2015-12-07 MED ORDER — LABETALOL HCL 5 MG/ML IV SOLN: 10.0000 mg | INTRAVENOUS | Status: DC | PRN

## 2015-12-07 MED ORDER — IOPAMIDOL (ISOVUE-300) INJECTION 61%
INTRAVENOUS | Status: DC | PRN
  Administered 2015-12-07: 60 mL via INTRA_ARTERIAL

## 2015-12-07 MED ORDER — HEPARIN (PORCINE) IN NACL 2-0.9 UNIT/ML-% IJ SOLN
INTRAMUSCULAR | Status: AC
  Filled 2015-12-07: qty 1000

## 2015-12-07 MED ORDER — HYDROMORPHONE HCL 1 MG/ML IJ SOLN: 0.5000 mg | INTRAMUSCULAR | Status: DC | PRN

## 2015-12-07 MED ORDER — ACETAMINOPHEN 325 MG RE SUPP: 325.0000 mg | RECTAL | Status: DC | PRN

## 2015-12-07 MED ORDER — ACETAMINOPHEN 325 MG PO TABS: 325.0000 mg | ORAL_TABLET | ORAL | Status: DC | PRN

## 2015-12-07 MED ORDER — MIDAZOLAM HCL 2 MG/2ML IJ SOLN
INTRAMUSCULAR | Status: DC | PRN
  Administered 2015-12-07: 2 mg via INTRAVENOUS

## 2015-12-07 MED ORDER — LIDOCAINE-EPINEPHRINE (PF) 1 %-1:200000 IJ SOLN
INTRAMUSCULAR | Status: AC
  Filled 2015-12-07: qty 30

## 2015-12-07 MED ORDER — MIDAZOLAM HCL 2 MG/2ML IJ SOLN
INTRAMUSCULAR | Status: AC
  Filled 2015-12-07: qty 2

## 2015-12-07 MED ORDER — SODIUM CHLORIDE 0.9 % IV SOLN: 500.0000 mL | Freq: Once | INTRAVENOUS | Status: DC | PRN

## 2015-12-07 MED ORDER — CLOPIDOGREL BISULFATE 75 MG PO TABS: 75.0000 mg | ORAL_TABLET | Freq: Every day | ORAL | Status: DC

## 2015-12-07 SURGICAL SUPPLY — 17 items
BALLN LUTONIX DCB 6X40X130 (BALLOONS) ×4
BALLOON LUTONIX DCB 6X40X130 (BALLOONS) ×2 IMPLANT
CATH 5FR JB2 100CM (CATHETERS) ×4 IMPLANT
CATH ANGIO 5F 100CM .035 PIG (CATHETERS) ×4 IMPLANT
CATH G STR 4FX120.038 (CATHETERS) ×4 IMPLANT
DEVICE PRESTO INFLATION (MISCELLANEOUS) ×4 IMPLANT
DEVICE STARCLOSE SE CLOSURE (Vascular Products) ×4 IMPLANT
DEVICE TORQUE (MISCELLANEOUS) ×4 IMPLANT
GLIDEWIRE ANGLED SS 035X260CM (WIRE) ×4 IMPLANT
PACK ANGIOGRAPHY (CUSTOM PROCEDURE TRAY) ×4 IMPLANT
SHEATH BRITE TIP 5FRX11 (SHEATH) ×4 IMPLANT
SHEATH RAABE 6FRX70 (SHEATH) ×4 IMPLANT
STENT OMNILINK ELITE 7X29X80 (Permanent Stent) ×4 IMPLANT
SYR MEDRAD MARK V 150ML (SYRINGE) ×4 IMPLANT
TUBING CONTRAST HIGH PRESS 72 (TUBING) ×4 IMPLANT
WIRE J 3MM .035X145CM (WIRE) ×4 IMPLANT
WIRE MAGIC TORQUE 260C (WIRE) ×4 IMPLANT

## 2015-12-07 NOTE — H&P (Signed)
  [facility] History & Physical Update  The patient was interviewed and re-examined.  The patient's previous History and Physical has been reviewed and is unchanged.  There is no change in the plan of care. We plan to proceed with the scheduled procedure.  Nickoli Bagheri, MD  12/07/2015, 10:14 AM

## 2015-12-07 NOTE — Op Note (Signed)
OPERATIVE REPORT   PREOPERATIVE DIAGNOSIS: 1. 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 Same as above  PROCEDURE PERFORMED: 1. Ultrasound guidance vascular access to right femoral artery. 2. Catheter placement to left brachial artery from right femoral approach  from right femoral approach. 3. Thoracic aortogram and selective left upper extremity angiogram. 4. Percutaneous transluminal angioplasty of the proximal left subclavian artery with 6 mm diameter by 4 cm length Lutonix drug-coated angioplasty balloon 5. Stent placement to the proximal left subclavian artery with 7 mm diameter by 29 mm length balloon expandable stent for greater than 50% residual stenosis after angioplasty 6. StarClose closure device right femoral artery.  SURGEON: Algernon Huxley, MD  ANESTHESIA: Local with moderate conscious sedation using 3 mg of Versed and 100 g of fentanyl for approximately 35 minutes.  BLOOD LOSS: Minimal.  FLUOROSCOPY TIME: 5.7 minutes  CONTRAST: 60 cc  INDICATION FOR PROCEDURE: This is a 70 year old female who presented to our office with left subclavian steal syndrome.She describes arm pain particularly with activity as well as vertebrobasilar symptoms. Duplex showed a retrograde left vertebral artery consistent with subclavian steal syndrome. To further evaluate this to determine what options would be possible to treat the steal syndrome, angiogram of the left upper extremity is indicated. Risks and benefits are discussed. Informed consent was obtained.  DESCRIPTION OF PROCEDURE: The patient was brought to the vascular suite. Groins were shaved and prepped and sterile surgical field was created.Moderate conscious sedation was administered through a face-to-face encounter with the patient with the artery in evaluating her vital signs, mental status, telemetry, and pulse oximetry throughout the entire  procedure under direct supervision. The right femoral head was localized with fluoroscopy and the right femoral artery was then visualized with ultrasound and found to be widely patent. It was then accessed under direct ultrasound guidance without difficulty with a Seldinger needle and a permanent image was recorded. A J-wire and 5-French sheath were then placed. Pigtail catheter was placed into the ascending aorta and a thoracic aortogram was then performed in the LAO projection. This demonstrated normal origins to the great vessels a very steep reversed curve consistent with a type III arch and no antegrade left vertebral artery flow with what appeared to be a significant stenosis in the left subclavian artery about 2 cm beyond its origin and about a centimeter before the vertebral artery and internal mammary artery. The patient was given 3000 units of intravenous heparin and a JB2 catheter was used to selectively cannulate the left subclavian artery without difficulty.Selective imaging was then performed which confirmed about a 90% stenosis in the left subclavian artery with no antegrade left vertebral artery flow even through a selective injection. I then tediously cross this lesion with only a mild amount of difficulty with a stiff angle Glidewire was able to advance the JB2 catheter well down into the arm into the left brachial artery for purchase. Imaging was performed which showed some irregularity of the left brachial artery without high-grade stenosis and a tortuous ulnar artery but the ulnar and radial arteries were continuous without high-grade stenosis. I then gave the patient additional 1500 units of intravenous heparin and decided to proceed with treatment. A 70 cm 6 French sheath was then placed with its tip just at the origin of the left subclavian artery. I selected a 6 mm diameter by 4 cm length Lutonix drug-coated angioplasty balloon and inflated this in the proximal left subclavian  artery to approximately 10  atm for 1 minute. The balloon was deflated and angiograms performed which demonstrated greater than 50% residual stenosis. I then decided to place a stent. A 7 mm diameter by 29 cm length Omnilink stent was then deployed in the proximal left subclavian artery taking care to avoid covering the origin of the left vertebral artery or left internal mammary artery. This was inflated to 12 atm. Completion angiogram showed less than 10% residual stenosis. The diagnostic catheter was removed and the sheath was pulled back down to the ipsilateral external iliac artery on the right. Oblique arteriogram was performed of the right femoral artery and StarClose closure device deployed in the usual fashion with excellent hemostatic result. The patient tolerated the procedure well and was taken to the recovery room in stable condition.   Jenetta Wease 12/07/2015 12:32 PM

## 2015-12-09 ENCOUNTER — Encounter: Payer: Self-pay | Admitting: Vascular Surgery

## 2016-02-04 ENCOUNTER — Inpatient Hospital Stay
Admission: EM | Admit: 2016-02-04 | Discharge: 2016-02-09 | DRG: 516 | Disposition: A | Discharge status: Skilled Nursing Facility | Payer: Medicare Other | Attending: Internal Medicine | Admitting: Internal Medicine

## 2016-02-04 ENCOUNTER — Emergency Department: Payer: Medicare Other

## 2016-02-04 ENCOUNTER — Encounter: Payer: Self-pay | Admitting: Emergency Medicine

## 2016-02-04 DIAGNOSIS — Z888 Allergy status to other drugs, medicaments and biological substances status: Secondary | ICD-10-CM | POA: Diagnosis not present

## 2016-02-04 DIAGNOSIS — Z7982 Long term (current) use of aspirin: Secondary | ICD-10-CM | POA: Diagnosis not present

## 2016-02-04 DIAGNOSIS — B9620 Unspecified Escherichia coli [E. coli] as the cause of diseases classified elsewhere: Secondary | ICD-10-CM | POA: Diagnosis present

## 2016-02-04 DIAGNOSIS — Z9981 Dependence on supplemental oxygen: Secondary | ICD-10-CM

## 2016-02-04 DIAGNOSIS — M5459 Other low back pain: Secondary | ICD-10-CM | POA: Diagnosis present

## 2016-02-04 DIAGNOSIS — J9610 Chronic respiratory failure, unspecified whether with hypoxia or hypercapnia: Secondary | ICD-10-CM | POA: Diagnosis present

## 2016-02-04 DIAGNOSIS — Z7902 Long term (current) use of antithrombotics/antiplatelets: Secondary | ICD-10-CM | POA: Diagnosis not present

## 2016-02-04 DIAGNOSIS — S22000A Wedge compression fracture of unspecified thoracic vertebra, initial encounter for closed fracture: Secondary | ICD-10-CM

## 2016-02-04 DIAGNOSIS — S22080A Wedge compression fracture of T11-T12 vertebra, initial encounter for closed fracture: Secondary | ICD-10-CM | POA: Diagnosis present

## 2016-02-04 DIAGNOSIS — W19XXXA Unspecified fall, initial encounter: Secondary | ICD-10-CM | POA: Diagnosis present

## 2016-02-04 DIAGNOSIS — M40209 Unspecified kyphosis, site unspecified: Secondary | ICD-10-CM

## 2016-02-04 DIAGNOSIS — J449 Chronic obstructive pulmonary disease, unspecified: Secondary | ICD-10-CM | POA: Diagnosis present

## 2016-02-04 DIAGNOSIS — K219 Gastro-esophageal reflux disease without esophagitis: Secondary | ICD-10-CM | POA: Diagnosis present

## 2016-02-04 DIAGNOSIS — I2510 Atherosclerotic heart disease of native coronary artery without angina pectoris: Secondary | ICD-10-CM | POA: Diagnosis present

## 2016-02-04 DIAGNOSIS — Z79899 Other long term (current) drug therapy: Secondary | ICD-10-CM | POA: Diagnosis not present

## 2016-02-04 DIAGNOSIS — M810 Age-related osteoporosis without current pathological fracture: Secondary | ICD-10-CM | POA: Diagnosis present

## 2016-02-04 DIAGNOSIS — I10 Essential (primary) hypertension: Secondary | ICD-10-CM | POA: Diagnosis present

## 2016-02-04 DIAGNOSIS — F419 Anxiety disorder, unspecified: Secondary | ICD-10-CM | POA: Diagnosis present

## 2016-02-04 DIAGNOSIS — M069 Rheumatoid arthritis, unspecified: Secondary | ICD-10-CM | POA: Diagnosis present

## 2016-02-04 DIAGNOSIS — K5900 Constipation, unspecified: Secondary | ICD-10-CM | POA: Diagnosis present

## 2016-02-04 DIAGNOSIS — N390 Urinary tract infection, site not specified: Secondary | ICD-10-CM

## 2016-02-04 DIAGNOSIS — IMO0002 Reserved for concepts with insufficient information to code with codable children: Secondary | ICD-10-CM

## 2016-02-04 DIAGNOSIS — F329 Major depressive disorder, single episode, unspecified: Secondary | ICD-10-CM | POA: Diagnosis present

## 2016-02-04 DIAGNOSIS — M545 Low back pain: Secondary | ICD-10-CM | POA: Diagnosis present

## 2016-02-04 DIAGNOSIS — Z87891 Personal history of nicotine dependence: Secondary | ICD-10-CM

## 2016-02-04 DIAGNOSIS — E860 Dehydration: Secondary | ICD-10-CM | POA: Diagnosis present

## 2016-02-04 DIAGNOSIS — Z79891 Long term (current) use of opiate analgesic: Secondary | ICD-10-CM

## 2016-02-04 DIAGNOSIS — R000 Tachycardia, unspecified: Secondary | ICD-10-CM | POA: Diagnosis present

## 2016-02-04 LAB — CBC WITH DIFFERENTIAL/PLATELET
BASOS ABS: 0 10*3/uL (ref 0–0.1)
BASOS PCT: 0 %
Eosinophils Absolute: 0.1 10*3/uL (ref 0–0.7)
Eosinophils Relative: 0 %
HEMATOCRIT: 35.4 % (ref 35.0–47.0)
HEMOGLOBIN: 12.1 g/dL (ref 12.0–16.0)
Lymphocytes Relative: 11 %
Lymphs Abs: 2.7 10*3/uL (ref 1.0–3.6)
MCH: 30.8 pg (ref 26.0–34.0)
MCHC: 34.3 g/dL (ref 32.0–36.0)
MCV: 89.9 fL (ref 80.0–100.0)
Monocytes Absolute: 1.5 10*3/uL — ABNORMAL HIGH (ref 0.2–0.9)
Monocytes Relative: 6 %
NEUTROS ABS: 20.9 10*3/uL — AB (ref 1.4–6.5)
NEUTROS PCT: 83 %
Platelets: 436 10*3/uL (ref 150–440)
RBC: 3.93 MIL/uL (ref 3.80–5.20)
RDW: 17.7 % — AB (ref 11.5–14.5)
WBC: 25.2 10*3/uL — AB (ref 3.6–11.0)

## 2016-02-04 LAB — COMPREHENSIVE METABOLIC PANEL
ALK PHOS: 119 U/L (ref 38–126)
ALT: 20 U/L (ref 14–54)
ANION GAP: 7 (ref 5–15)
AST: 30 U/L (ref 15–41)
Albumin: 3.3 g/dL — ABNORMAL LOW (ref 3.5–5.0)
BILIRUBIN TOTAL: 0.4 mg/dL (ref 0.3–1.2)
BUN: 27 mg/dL — ABNORMAL HIGH (ref 6–20)
CALCIUM: 8.5 mg/dL — AB (ref 8.9–10.3)
CO2: 28 mmol/L (ref 22–32)
Chloride: 102 mmol/L (ref 101–111)
Creatinine, Ser: 0.69 mg/dL (ref 0.44–1.00)
Glucose, Bld: 131 mg/dL — ABNORMAL HIGH (ref 65–99)
Potassium: 3.4 mmol/L — ABNORMAL LOW (ref 3.5–5.1)
SODIUM: 137 mmol/L (ref 135–145)
TOTAL PROTEIN: 7.5 g/dL (ref 6.5–8.1)

## 2016-02-04 LAB — URINALYSIS COMPLETE WITH MICROSCOPIC (ARMC ONLY)
Bilirubin Urine: NEGATIVE
GLUCOSE, UA: NEGATIVE mg/dL
Hgb urine dipstick: NEGATIVE
Ketones, ur: NEGATIVE mg/dL
Nitrite: POSITIVE — AB
Protein, ur: 30 mg/dL — AB
SPECIFIC GRAVITY, URINE: 1.031 — AB (ref 1.005–1.030)
pH: 5 (ref 5.0–8.0)

## 2016-02-04 LAB — PROTIME-INR
INR: 1.18
PROTHROMBIN TIME: 15.2 s — AB (ref 11.4–15.0)

## 2016-02-04 MED ORDER — QUETIAPINE FUMARATE 25 MG PO TABS
150.0000 mg | ORAL_TABLET | Freq: Every day | ORAL | Status: DC
  Administered 2016-02-04 – 2016-02-08 (×5): 150 mg via ORAL
  Filled 2016-02-04 (×5): qty 6

## 2016-02-04 MED ORDER — TRAMADOL HCL 50 MG PO TABS
50.0000 mg | ORAL_TABLET | Freq: Once | ORAL | Status: AC
  Administered 2016-02-04: 50 mg via ORAL
  Filled 2016-02-04: qty 1

## 2016-02-04 MED ORDER — OXYCODONE-ACETAMINOPHEN 10-325 MG PO TABS: 1.0000 | ORAL_TABLET | ORAL | Status: DC | PRN

## 2016-02-04 MED ORDER — ACETAMINOPHEN 325 MG PO TABS
650.0000 mg | ORAL_TABLET | Freq: Four times a day (QID) | ORAL | Status: DC | PRN
  Administered 2016-02-05: 650 mg via ORAL
  Filled 2016-02-04: qty 2

## 2016-02-04 MED ORDER — ONDANSETRON HCL 4 MG/2ML IJ SOLN
4.0000 mg | Freq: Four times a day (QID) | INTRAMUSCULAR | Status: DC | PRN
  Administered 2016-02-06: 4 mg via INTRAVENOUS
  Filled 2016-02-04: qty 2

## 2016-02-04 MED ORDER — DULOXETINE HCL 60 MG PO CPEP
60.0000 mg | ORAL_CAPSULE | Freq: Every day | ORAL | Status: DC
  Administered 2016-02-04: 60 mg via ORAL
  Filled 2016-02-04: qty 1

## 2016-02-04 MED ORDER — MORPHINE SULFATE (PF) 4 MG/ML IV SOLN
4.0000 mg | Freq: Once | INTRAVENOUS | Status: AC
  Administered 2016-02-04: 4 mg via INTRAVENOUS

## 2016-02-04 MED ORDER — MORPHINE SULFATE (PF) 4 MG/ML IV SOLN
4.0000 mg | Freq: Once | INTRAVENOUS | Status: AC
  Administered 2016-02-04: 4 mg via INTRAVENOUS
  Filled 2016-02-04: qty 1

## 2016-02-04 MED ORDER — FLUTICASONE FUROATE-VILANTEROL 100-25 MCG/INH IN AEPB
1.0000 | INHALATION_SPRAY | Freq: Every morning | RESPIRATORY_TRACT | Status: DC
Start: 2016-02-05 — End: 2016-02-09
  Administered 2016-02-05 – 2016-02-09 (×4): 1 via RESPIRATORY_TRACT
  Filled 2016-02-04: qty 28

## 2016-02-04 MED ORDER — MORPHINE SULFATE (PF) 2 MG/ML IV SOLN
2.0000 mg | Freq: Once | INTRAVENOUS | Status: AC
  Administered 2016-02-04: 2 mg via INTRAVENOUS
  Filled 2016-02-04: qty 1

## 2016-02-04 MED ORDER — HEPARIN SODIUM (PORCINE) 5000 UNIT/ML IJ SOLN
5000.0000 [IU] | Freq: Three times a day (TID) | INTRAMUSCULAR | Status: DC
  Administered 2016-02-04 – 2016-02-05 (×2): 5000 [IU] via SUBCUTANEOUS
  Filled 2016-02-04 (×2): qty 1

## 2016-02-04 MED ORDER — ALBUTEROL SULFATE HFA 108 (90 BASE) MCG/ACT IN AERS: 2.0000 | INHALATION_SPRAY | Freq: Four times a day (QID) | RESPIRATORY_TRACT | Status: DC | PRN

## 2016-02-04 MED ORDER — OXYCODONE HCL 5 MG PO TABS
5.0000 mg | ORAL_TABLET | ORAL | Status: DC | PRN
  Administered 2016-02-04 – 2016-02-05 (×6): 5 mg via ORAL
  Filled 2016-02-04 (×6): qty 1

## 2016-02-04 MED ORDER — IPRATROPIUM-ALBUTEROL 0.5-2.5 (3) MG/3ML IN SOLN
3.0000 mL | Freq: Four times a day (QID) | RESPIRATORY_TRACT | Status: DC
Start: 2016-02-04 — End: 2016-02-09
  Administered 2016-02-04 – 2016-02-09 (×17): 3 mL via RESPIRATORY_TRACT
  Filled 2016-02-04 (×19): qty 3

## 2016-02-04 MED ORDER — DOCUSATE SODIUM 100 MG PO CAPS
100.0000 mg | ORAL_CAPSULE | Freq: Two times a day (BID) | ORAL | Status: DC
  Administered 2016-02-04 – 2016-02-05 (×2): 100 mg via ORAL
  Filled 2016-02-04 (×2): qty 1

## 2016-02-04 MED ORDER — CEFTRIAXONE SODIUM 1 G IJ SOLR
1.0000 g | INTRAMUSCULAR | Status: DC
  Administered 2016-02-05 – 2016-02-07 (×3): 1 g via INTRAVENOUS
  Filled 2016-02-04 (×5): qty 10

## 2016-02-04 MED ORDER — ALBUTEROL SULFATE (2.5 MG/3ML) 0.083% IN NEBU: 2.5000 mg | INHALATION_SOLUTION | Freq: Four times a day (QID) | RESPIRATORY_TRACT | Status: DC | PRN

## 2016-02-04 MED ORDER — ASPIRIN EC 81 MG PO TBEC
81.0000 mg | DELAYED_RELEASE_TABLET | Freq: Every day | ORAL | Status: DC
  Administered 2016-02-04 – 2016-02-09 (×5): 81 mg via ORAL
  Filled 2016-02-04 (×5): qty 1

## 2016-02-04 MED ORDER — MORPHINE SULFATE (PF) 4 MG/ML IV SOLN
INTRAVENOUS | Status: AC
  Administered 2016-02-04: 4 mg via INTRAVENOUS
  Filled 2016-02-04: qty 1

## 2016-02-04 MED ORDER — METOPROLOL TARTRATE 25 MG PO TABS: 25.0000 mg | ORAL_TABLET | Freq: Two times a day (BID) | ORAL | Status: DC

## 2016-02-04 MED ORDER — DULOXETINE HCL 30 MG PO CPEP
30.0000 mg | ORAL_CAPSULE | Freq: Every day | ORAL | Status: DC
  Administered 2016-02-04 – 2016-02-08 (×5): 30 mg via ORAL
  Filled 2016-02-04 (×6): qty 1

## 2016-02-04 MED ORDER — GABAPENTIN 300 MG PO CAPS
300.0000 mg | ORAL_CAPSULE | Freq: Three times a day (TID) | ORAL | Status: DC | PRN
  Administered 2016-02-05 – 2016-02-09 (×5): 300 mg via ORAL
  Filled 2016-02-04 (×5): qty 1

## 2016-02-04 MED ORDER — ONDANSETRON HCL 4 MG PO TABS: 4.0000 mg | ORAL_TABLET | Freq: Four times a day (QID) | ORAL | Status: DC | PRN

## 2016-02-04 MED ORDER — BISACODYL 10 MG RE SUPP
10.0000 mg | Freq: Every day | RECTAL | Status: DC | PRN
  Administered 2016-02-06 – 2016-02-09 (×2): 10 mg via RECTAL
  Filled 2016-02-04 (×2): qty 1

## 2016-02-04 MED ORDER — DEXTROSE 5 % IV SOLN
1.0000 g | Freq: Once | INTRAVENOUS | Status: AC
  Administered 2016-02-04: 1 g via INTRAVENOUS
  Filled 2016-02-04: qty 10

## 2016-02-04 MED ORDER — MORPHINE SULFATE (PF) 2 MG/ML IV SOLN
2.0000 mg | INTRAVENOUS | Status: DC | PRN
  Administered 2016-02-04 – 2016-02-06 (×9): 2 mg via INTRAVENOUS
  Filled 2016-02-04 (×10): qty 1

## 2016-02-04 MED ORDER — ACETAMINOPHEN 650 MG RE SUPP: 650.0000 mg | Freq: Four times a day (QID) | RECTAL | Status: DC | PRN

## 2016-02-04 MED ORDER — OXYCODONE-ACETAMINOPHEN 5-325 MG PO TABS
1.0000 | ORAL_TABLET | ORAL | Status: DC | PRN
  Administered 2016-02-04 – 2016-02-05 (×6): 1 via ORAL
  Filled 2016-02-04 (×6): qty 1

## 2016-02-04 MED ORDER — VITAMIN C 500 MG PO TABS
500.0000 mg | ORAL_TABLET | Freq: Every day | ORAL | Status: DC
  Administered 2016-02-04 – 2016-02-09 (×5): 500 mg via ORAL
  Filled 2016-02-04 (×5): qty 1

## 2016-02-04 MED ORDER — QUETIAPINE FUMARATE 25 MG PO TABS: 75.0000 mg | ORAL_TABLET | Freq: Every day | ORAL | Status: DC

## 2016-02-04 MED ORDER — DULOXETINE HCL 60 MG PO CPEP
60.0000 mg | ORAL_CAPSULE | Freq: Every morning | ORAL | Status: DC
  Administered 2016-02-05 – 2016-02-09 (×5): 60 mg via ORAL
  Filled 2016-02-04 (×3): qty 1

## 2016-02-04 MED ORDER — DIVALPROEX SODIUM 125 MG PO DR TAB
125.0000 mg | DELAYED_RELEASE_TABLET | Freq: Two times a day (BID) | ORAL | Status: DC
  Filled 2016-02-04: qty 1

## 2016-02-04 MED ORDER — ACETAMINOPHEN 325 MG PO TABS
650.0000 mg | ORAL_TABLET | Freq: Once | ORAL | Status: AC
  Administered 2016-02-04: 650 mg via ORAL
  Filled 2016-02-04: qty 2

## 2016-02-04 MED ORDER — FOLIC ACID 1 MG PO TABS
1.0000 mg | ORAL_TABLET | Freq: Every day | ORAL | Status: DC
  Administered 2016-02-04 – 2016-02-09 (×5): 1 mg via ORAL
  Filled 2016-02-04 (×5): qty 1

## 2016-02-04 MED ORDER — PANTOPRAZOLE SODIUM 40 MG PO TBEC
40.0000 mg | DELAYED_RELEASE_TABLET | Freq: Every day | ORAL | Status: DC
Start: 2016-02-04 — End: 2016-02-09
  Administered 2016-02-04 – 2016-02-09 (×5): 40 mg via ORAL
  Filled 2016-02-04 (×5): qty 1

## 2016-02-04 MED ORDER — CALCIUM CITRATE-VITAMIN D 500-400 MG-UNIT PO CHEW
1.0000 | CHEWABLE_TABLET | Freq: Two times a day (BID) | ORAL | Status: DC
  Administered 2016-02-04 – 2016-02-09 (×8): 1 via ORAL
  Filled 2016-02-04 (×9): qty 1

## 2016-02-04 MED ORDER — CYCLOBENZAPRINE HCL 10 MG PO TABS
5.0000 mg | ORAL_TABLET | Freq: Three times a day (TID) | ORAL | Status: DC | PRN
  Administered 2016-02-04 – 2016-02-08 (×6): 5 mg via ORAL
  Filled 2016-02-04 (×6): qty 1

## 2016-02-04 MED ORDER — POTASSIUM CHLORIDE IN NACL 20-0.9 MEQ/L-% IV SOLN
INTRAVENOUS | Status: DC
  Administered 2016-02-04 – 2016-02-05 (×2): via INTRAVENOUS
  Filled 2016-02-04 (×6): qty 1000

## 2016-02-04 NOTE — ED Notes (Signed)
Pt states that she was standing and grabbed the bedpost for support when she became dizzy and lightheaded. Pt denies LOC or hitting head. Pt is alert and oriented.

## 2016-02-04 NOTE — Progress Notes (Signed)
Patient ID: Michelle Cabrera, female   DOB: [birth year], 70 y.o.   MRN: [id number] I was called to evaluate the CT on this pt who suffered a fall. Previous lumbar fusion by [facility]. Previous kyphoplasty x2. CT shows lower thoracic fracture above the previous kypho, some very mild retropulsion, 2-column, no canal stenosis, mild loss of height, minimal kyphosis. Hopefully this will heal in a brace. Needs no acute surgery. Sounds like she will be admitted for pain control and UTI.  Recs:  1. TLSO brace ([employer]) - may don while sitting, wear when OOB 2. Pain control as necessary 3. Follow up with Dr Newell Coral upon discharge

## 2016-02-04 NOTE — Progress Notes (Signed)
Spoke to pharmacist to clarify oxy/percocet dose. She said to give one percocet and one oxy.

## 2016-02-04 NOTE — Progress Notes (Signed)
Notified Dr Joneen Roach of pt's request for muscle relaxant. Received order for flexeril 5 mgm

## 2016-02-04 NOTE — ED Notes (Signed)
Pt resting in chair, brace on, family at bedside

## 2016-02-04 NOTE — ED Notes (Signed)
[employer] at bedside for brace fitting.

## 2016-02-04 NOTE — H&P (Signed)
History and Physical    Michelle Cabrera ZOX:[id number] DOB: [birth year] DOA: 02/04/2016  Referring physician: Dr. Huel Cote PCP: Lauro Regulus., MD  Specialists: none  Chief Complaint: back pain  HPI: Michelle Cabrera is a 70 y.o. female has a past medical history significant for DDD, HTN, COPD, and chronic back pain now with mechanical fall and severe back pain. Given 3 doses of IV pain meds in ER with no relief. X-rays reveal an acute thoracic compression fracture with some disc protrusion. She was also noted to have a UTI with WBC>25. She is now admitted. Afebrile. Some nausea. No vomiting or diarrhea. Denis CP or SOB. No syncope or near syncope. Cannot walk due to pain  Review of Systems: The patient denies anorexia, fever, weight loss,, vision loss, decreased hearing, hoarseness, chest pain, syncope, dyspnea on exertion, peripheral edema, balance deficits, hemoptysis, abdominal pain, melena, hematochezia, severe indigestion/heartburn, hematuria, incontinence, genital sores, muscle weakness, suspicious skin lesions, transient blindness,  depression, unusual weight change, abnormal bleeding, enlarged lymph nodes, angioedema, and breast masses.   Past Medical History  Diagnosis Date  . Hypertension   . Closed compression fracture of L1 lumbar vertebra (HCC) 11/2012  . COPD (chronic obstructive pulmonary disease) (HCC)   . Anxiety   . Depression   . GERD (gastroesophageal reflux disease)   . Rheumatoid arthritis (HCC)   . DDD (degenerative disc disease)   . Chronic lower back pain   . Collagen vascular disease [facility])    Past Surgical History  Procedure Laterality Date  . Lumbar disc surgery       X 3  . Kyphoplasty N/A 01/04/2013    Procedure: L1 Kyphoplasty;  Surgeon: Hewitt Shorts, MD;  Location: [facility]URO ORS;  Service: Neurosurgery;  Laterality: N/A;  L1 Kyphoplasty  . Tubal ligation    . Foot surgery    . Wrist fusion Right     30 yrs ago  . Kyphoplasty N/A 02/07/2014     Procedure: THORACIC SIX KYPHOPLASTY;  Surgeon: Hewitt Shorts, MD;  Location: [facility];  Service: Neurosurgery;  Laterality: N/A;  T6 Kyphoplasty   . Hand tendon surgery Right 09/15/2014    "d/t RA"  . Tonsillectomy    . Posterior lumbar fusion      "got screws in"  . Back surgery    . Cataract extraction w/ intraocular lens  implant, bilateral Bilateral   . Finger arthroplasty Right 09/15/2014    Procedure: RIGHT MIDDLE FINGER, RING FINGER, SMALL FINGER EXTENSOR DIGITORUM COMMUMIS STABILIZATION WITH RIGHT MIDDLE FINGER MCP ARTHROPLASTY, POSSIBLE RING FINGER AND SMALL FINGER MCP ARTHROPLASTY;  Surgeon: Dominica Severin, MD;  Location: [facility] OR;  Service: Orthopedics;  Laterality: Right;  . Breast biopsy Left     negative  . Peripheral vascular catheterization Left 12/07/2015    Procedure: Upper Extremity Angiography;  Surgeon: Annice Needy, MD;  Location: [facility] INVASIVE CV LAB;  Service: Cardiovascular;  Laterality: Left;  . Peripheral vascular catheterization  12/07/2015    Procedure: Upper Extremity Intervention;  Surgeon: Annice Needy, MD;  Location: [facility] INVASIVE CV LAB;  Service: Cardiovascular;;   Social History:  reports that she quit smoking about 6 months ago. Her smoking use included Cigarettes. She has a 53 pack-year smoking history. She quit smokeless tobacco use about 6 months ago. She reports that she does not drink alcohol or use illicit drugs.  Allergies  Allergen Reactions  . Gold-Containing Drug Products Anaphylaxis  . Adhesive [Tape]  Tears skin  . Dilaudid [Hydromorphone Hcl]     Made tongue swell  . Haldol [Haloperidol] Other (See Comments)    Hallucinations  . Pravastatin     Arms and legs hurt     Family History  Problem Relation Age of Onset  . CAD Father   . Alcohol abuse Father   . CAD Mother   . Hypertension Mother   . Peripheral vascular disease Mother   . Breast cancer Maternal Aunt 50  . Alcohol abuse Sister     Prior to Admission medications    Medication Sig Start Date End Date Taking? Authorizing Provider  albuterol (PROVENTIL HFA;VENTOLIN HFA) 108 (90 Base) MCG/ACT inhaler Inhale 2 puffs into the lungs every 6 (six) hours as needed for wheezing or shortness of breath.    Historical Provider, MD  aspirin EC 81 MG tablet Take 1 tablet (81 mg total) by mouth daily. 12/07/15   Annice Needy, MD  Calcium Carbonate-Vit D-Min (CALCIUM-VITAMIN D-MINERALS) 600-400 MG-UNIT CHEW Chew 1 tablet by mouth 2 (two) times daily.     Historical Provider, MD  clopidogrel (PLAVIX) 75 MG tablet Take 1 tablet (75 mg total) by mouth daily. 12/07/15   Annice Needy, MD  divalproex (DEPAKOTE) 125 MG DR tablet Take 125 mg by mouth 2 (two) times daily. Reported on 12/07/2015    Historical Provider, MD  DULoxetine (CYMBALTA) 30 MG capsule Take 30 mg by mouth daily.    Historical Provider, MD  DULoxetine (CYMBALTA) 60 MG capsule Take 60 mg by mouth daily.     Historical Provider, MD  Fluticasone Furoate-Vilanterol (BREO ELLIPTA) 100-25 MCG/INH AEPB Inhale 1 puff into the lungs every morning.    Historical Provider, MD  folic acid (FOLVITE) 1 MG tablet Take 1 mg by mouth daily.    Historical Provider, MD  gabapentin (NEURONTIN) 300 MG capsule Take 300 mg by mouth 3 (three) times daily as needed (leg pain).     Historical Provider, MD  ibuprofen (ADVIL,MOTRIN) 200 MG tablet Take 400 mg by mouth every 6 (six) hours as needed for mild pain. Reported on 12/07/2015    Historical Provider, MD  ipratropium-albuterol (DUONEB) 0.5-2.5 (3) MG/3ML SOLN Take 3 mLs by nebulization every 6 (six) hours as needed (for shortness of breath).     Historical Provider, MD  metoprolol tartrate (LOPRESSOR) 25 MG tablet Take 25 mg by mouth 2 (two) times daily. Reported on 12/07/2015    Historical Provider, MD  nicotine (NICODERM CQ - DOSED IN MG/24 HOURS) 14 mg/24hr patch Place 14 mg onto the skin daily.    Historical Provider, MD  omeprazole (PRILOSEC) 20 MG capsule Take 20 mg by mouth 2 (two)  times daily.     Historical Provider, MD  oxyCODONE-acetaminophen (PERCOCET) 10-325 MG tablet Take 1 tablet by mouth every 6 (six) hours as needed for pain. 11/11/15   Ramonita Lab, MD  QUEtiapine (SEROQUEL) 100 MG tablet Take 1.5 tablets (150 mg total) by mouth at bedtime. Patient taking differently: Take 75 mg by mouth at bedtime.  11/11/15   Ramonita Lab, MD  vitamin C (ASCORBIC ACID) 500 MG tablet Take 500 mg by mouth daily.    Historical Provider, MD  zoledronic acid (RECLAST) 5 MG/100ML SOLN injection Inject 5 mg into the vein See admin instructions. Reported on 12/07/2015    Historical Provider, MD   Physical Exam: Filed Vitals:   02/04/16 1130 02/04/16 1147 02/04/16 1300 02/04/16 1330  BP:  139/74 120/63 129/58  Pulse: 86  90 86 85  Temp:      TempSrc:      Resp: 16 12 14 13   Height:      Weight:      SpO2: 100% 100% 100% 100%     General:  WDWN, [place]/AT. In moderate distress due to pain  Eyes: PERRL, EOMI, no scleral icterus, conjunctiva clear  ENT: moist oropharynx, no lesions. TM's benign. Dentition fair  Neck: supple, no lymphadenopathy. No bruits or thyromegaly  Cardiovascular: regular rate without MRG; 2+ peripheral pulses, no JVD, no peripheral edema  Respiratory: CTA biL, good air movement without wheezing, rhonchi or crackled. Respiratory effort normal  Abdomen: soft, non tender to palpation, positive bowel sounds, no guarding, no rebound  Skin: no rashes or lesions  Musculoskeletal: normal bulk and tone, no joint swelling  Psychiatric: normal mood and affect, A&OX3  Neurologic: CN 2-12 grossly intact, Motor strength 5/5 in all 4 groups with symmetric DTR's and non-focal sensory exam  Labs on Admission:  Basic Metabolic Panel:  Recent Labs Lab 02/04/16 0657  NA 137  K 3.4*  CL 102  CO2 28  GLUCOSE 131*  BUN 27*  CREATININE 0.69  CALCIUM 8.5*   Liver Function Tests:  Recent Labs Lab 02/04/16 0657  AST 30  ALT 20  ALKPHOS 119  BILITOT 0.4   PROT 7.5  ALBUMIN 3.3*   No results for input(s): LIPASE, AMYLASE in the last 168 hours. No results for input(s): AMMONIA in the last 168 hours. CBC:  Recent Labs Lab 02/04/16 0657  WBC 25.2*  NEUTROABS 20.9*  HGB 12.1  HCT 35.4  MCV 89.9  PLT 436   Cardiac Enzymes: No results for input(s): CKTOTAL, CKMB, CKMBINDEX, TROPONINI in the last 168 hours.  BNP (last 3 results)  Recent Labs  02/26/15 0839  BNP 67.0    ProBNP (last 3 results) No results for input(s): PROBNP in the last 8760 hours.  CBG: No results for input(s): GLUCAP in the last 168 hours.  Radiological Exams on Admission: Dg Lumbar Spine 2-3 Views  02/04/2016  CLINICAL DATA:  Pain following fall. Radicular symptoms left lower extremity EXAM: LUMBAR SPINE - 2-3 VIEW COMPARISON:  November 10, 2015 FINDINGS: Frontal, lateral, and spot lumbosacral lateral images were obtained. Bones are osteoporotic. There are 5 non-rib-bearing lumbar type vertebral bodies. Patient has had previous vertebroplasty at L1, stable. There is postoperative change with screw and plate fixation at L2, L3, L4 with a disc spacer at L3-4. The support hardware appears intact and stable. There is a new anterior wedge compression fracture of the T12 vertebral body. No other new fracture evident. There is no spondylolisthesis. There is degenerative disc disease at all levels, stable. Disc space narrowing is most marked at L1-2. There is atherosclerotic calcification in the aorta. IMPRESSION: There is a new fracture of the T12 vertebral body with moderate anterior wedging at T12. No other new fracture. Patient has had previous vertebroplasty at L1. No spondylolisthesis. Extensive postoperative change and arthropathy, stable. Bones osteoporotic. There is extensive atherosclerotic calcification in the aorta. Electronically Signed   By: November 12, 2015 III M.D.   On: 02/04/2016 07:40   Ct Thoracic Spine Wo Contrast  02/04/2016  CLINICAL DATA:  70 year old  female status post fall with thoracic back pain. Initial encounter. EXAM: CT THORACIC SPINE WITHOUT CONTRAST TECHNIQUE: Multidetector CT imaging of the thoracic spine was performed without intravenous contrast administration. Multiplanar CT image reconstructions were also generated. COMPARISON:  Chest radiographs 11/10/2015.  Chest CTA 02/26/2015.  FINDINGS: Osteopenia. Congenital incomplete segmentation of T1-T2. Partially visible chronic L1 transpedicular fusion hardware. Sequelae of augmented T6 and L1 compression fractures is unchanged since 2016. A severe T5 compression fracture is new since that time, but stable since the chest radiographs in April. Mild to moderate superior endplate compression of T1 is new since 2016, and was not well visualized on the more recent radiographs. The compressed superior endplate is sclerotic (sagittal image 23). There is congenital interbody and some posterior element fusion of T1-T2. There is a comminuted fracture of the T12 vertebral body (sagittal image 23) with loss of height up to 30%. There is retropulsion of the posterior T12 vertebral body narrowing the AP spinal canal to 10 mm (versus 13-14 mm at an unaffected level). Fracture extends through the junction of the vertebral body and pedicles, but the T12 pedicles and posterior elements otherwise appear intact. Other thoracic vertebrae appear intact. Chronic posterior left fourth rib fracture is new since 2016. Chronic posterior right twelfth rib fracture is new since 2016. No acute displaced posterior rib fracture identified. Calcified aortic and coronary artery atherosclerosis. No pericardial effusion. Negative visualized noncontrast upper abdominal viscera. Increased septal thickening throughout the lungs compare to 2016. Increased dependent opacity most resembling atelectasis. IMPRESSION: 1. Acute appearing comminuted T12 vertebral body fracture - a complex compression or mild burst-type fracture. Recommend Spine  Surgery consultation. Retropulsion of bone resulting in mild spinal stenosis. Loss of height up to 30%. Underlying severe osteopenia. 2. Severe T5 compression fracture is stable since April. Mild T1 superior endplate compression fracture is favored to be subacute in light of endplate sclerosis. 3. Chronically augmented T6 and L1 compression fractures. Occasional chronic posterior rib fractures. No acute posterior rib fracture identified. 4. Dependent bilateral pulmonary atelectasis. Increased pulmonary septal thickening suggesting a degree of interstitial edema. 5. Calcified aortic atherosclerosis. Electronically Signed   By: Odessa Fleming M.D.   On: 02/04/2016 10:22    EKG: Independently reviewed.  Assessment/Plan Principal Problem:   Thoracic compression fracture (HCC) Active Problems:   Intractable low back pain   Rheumatoid arthritis (HCC)   UTI (lower urinary tract infection)   Will admit to floor with IV fluids and IV ABX. Consult PT, Ortho, and CSW as pt will need rehab placement. IV pain meds as needed. Repeat labs in AM.  Diet: clear liquids Fluids: NS with K+@100  DVT Prophylaxis: SQ Heparin  Code Status: FULL  Family Communication: none  Disposition Plan: SNF  Time spent: 50 min

## 2016-02-04 NOTE — ED Provider Notes (Signed)
Time Seen: Approximately 0 810 I have reviewed the triage notes  Chief Complaint: Fall   History of Present Illness: MAHLI COONROD is a 70 y.o. female *who states that she had a non-syncopal fall today. Patient apparently was getting up to urinate and felt dizzy and lightheaded. She has had similar episodes based on what the family can describe as vertigo type symptoms. She fell directly down on her buttock region without any head trauma. She is complaining of some low back pain and has had a history of previous back surgery with rods placed 2 years ago by neurosurgery at [facility]. The patient denies any weakness in the leg still had difficulty moving secondary to the intractable back pain. Patient denies any radiation pain down into the legs. She denies any bladder or bowel dysfunction. Patient denies any neck or upper thoracic pain and points mainly to the lower back region source of discomfort.   Past Medical History  Diagnosis Date  . Hypertension   . Closed compression fracture of L1 lumbar vertebra (HCC) 11/2012  . COPD (chronic obstructive pulmonary disease) (HCC)   . Anxiety   . Depression   . GERD (gastroesophageal reflux disease)   . Rheumatoid arthritis (HCC)   . DDD (degenerative disc disease)   . Chronic lower back pain   . Collagen vascular disease [facility])     Patient Active Problem List   Diagnosis Date Noted  . Thoracic compression fracture (HCC) 02/04/2016  . UTI (lower urinary tract infection) 02/04/2016  . Schizophrenia spectrum disorder with psychotic disorder type not yet determined (HCC) 11/13/2015  . Syncope 11/10/2015  . Hypotension 11/10/2015  . 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 Pressure ulcer 09/08/2015  . New onset type 2 diabetes mellitus (HCC) 09/06/2015  . Altered mental status 09/06/2015  . COPD exacerbation (HCC) 07/26/2015  . Arthritis associated with another disorder 09/15/2014  . Wedge compression fracture of T6 vertebra (HCC) 02/07/2014   . Intractable low back pain 12/27/2012  . DDD (degenerative disc disease) 12/27/2012  . Rheumatoid arthritis (HCC) 12/27/2012  . Hypertension 12/27/2012    Past Surgical History  Procedure Laterality Date  . Lumbar disc surgery       X 3  . Kyphoplasty N/A 01/04/2013    Procedure: L1 Kyphoplasty;  Surgeon: Hewitt Shorts, MD;  Location: [facility];  Service: Neurosurgery;  Laterality: N/A;  L1 Kyphoplasty  . Tubal ligation    . Foot surgery    . Wrist fusion Right     30 yrs ago  . Kyphoplasty N/A 02/07/2014    Procedure: THORACIC SIX KYPHOPLASTY;  Surgeon: Hewitt Shorts, MD;  Location: [facility];  Service: Neurosurgery;  Laterality: N/A;  T6 Kyphoplasty   . Hand tendon surgery Right 09/15/2014    "d/t RA"  . Tonsillectomy    . Posterior lumbar fusion      "got screws in"  . Back surgery    . Cataract extraction w/ intraocular lens  implant, bilateral Bilateral   . Finger arthroplasty Right 09/15/2014    Procedure: RIGHT MIDDLE FINGER, RING FINGER, SMALL FINGER EXTENSOR DIGITORUM COMMUMIS STABILIZATION WITH RIGHT MIDDLE FINGER MCP ARTHROPLASTY, POSSIBLE RING FINGER AND SMALL FINGER MCP ARTHROPLASTY;  Surgeon: Dominica Severin, MD;  Location: [facility] OR;  Service: Orthopedics;  Laterality: Right;  . Breast biopsy Left     negative  . Peripheral vascular catheterization Left 12/07/2015    Procedure: Upper Extremity Angiography;  Surgeon: Annice Needy, MD;  Location:  [facility] INVASIVE CV LAB;  Service: Cardiovascular;  Laterality: Left;  . Peripheral vascular catheterization  12/07/2015    Procedure: Upper Extremity Intervention;  Surgeon: Annice Needy, MD;  Location: [facility] INVASIVE CV LAB;  Service: Cardiovascular;;    Past Surgical History  Procedure Laterality Date  . Lumbar disc surgery       X 3  . Kyphoplasty N/A 01/04/2013    Procedure: L1 Kyphoplasty;  Surgeon: Hewitt Shorts, MD;  Location: [facility];  Service: Neurosurgery;  Laterality: N/A;  L1 Kyphoplasty  . Tubal  ligation    . Foot surgery    . Wrist fusion Right     30 yrs ago  . Kyphoplasty N/A 02/07/2014    Procedure: THORACIC SIX KYPHOPLASTY;  Surgeon: Hewitt Shorts, MD;  Location: [facility];  Service: Neurosurgery;  Laterality: N/A;  T6 Kyphoplasty   . Hand tendon surgery Right 09/15/2014    "d/t RA"  . Tonsillectomy    . Posterior lumbar fusion      "got screws in"  . Back surgery    . Cataract extraction w/ intraocular lens  implant, bilateral Bilateral   . Finger arthroplasty Right 09/15/2014    Procedure: RIGHT MIDDLE FINGER, RING FINGER, SMALL FINGER EXTENSOR DIGITORUM COMMUMIS STABILIZATION WITH RIGHT MIDDLE FINGER MCP ARTHROPLASTY, POSSIBLE RING FINGER AND SMALL FINGER MCP ARTHROPLASTY;  Surgeon: Dominica Severin, MD;  Location: [facility] OR;  Service: Orthopedics;  Laterality: Right;  . Breast biopsy Left     negative  . Peripheral vascular catheterization Left 12/07/2015    Procedure: Upper Extremity Angiography;  Surgeon: Annice Needy, MD;  Location: [facility] INVASIVE CV LAB;  Service: Cardiovascular;  Laterality: Left;  . Peripheral vascular catheterization  12/07/2015    Procedure: Upper Extremity Intervention;  Surgeon: Annice Needy, MD;  Location: [facility] INVASIVE CV LAB;  Service: Cardiovascular;;    Current Outpatient Rx  Name  Route  Sig  Dispense  Refill  . albuterol (PROVENTIL HFA;VENTOLIN HFA) 108 (90 Base) MCG/ACT inhaler   Inhalation   Inhale 2 puffs into the lungs every 6 (six) hours as needed for wheezing or shortness of breath.         Marland Kitchen aspirin EC 81 MG tablet   Oral   Take 1 tablet (81 mg total) by mouth daily.   150 tablet   2   . Calcium Carbonate-Vit D-Min (CALCIUM-VITAMIN D-MINERALS) 600-400 MG-UNIT CHEW   Oral   Chew 1 tablet by mouth 2 (two) times daily.          . clopidogrel (PLAVIX) 75 MG tablet   Oral   Take 1 tablet (75 mg total) by mouth daily.   30 tablet   11   . divalproex (DEPAKOTE) 125 MG DR tablet   Oral   Take 125 mg by mouth 2 (two) times  daily. Reported on 12/07/2015         . DULoxetine (CYMBALTA) 30 MG capsule   Oral   Take 30 mg by mouth daily.         . DULoxetine (CYMBALTA) 60 MG capsule   Oral   Take 60 mg by mouth daily.          . Fluticasone Furoate-Vilanterol (BREO ELLIPTA) 100-25 MCG/INH AEPB   Inhalation   Inhale 1 puff into the lungs every morning.         . folic acid (FOLVITE) 1 MG tablet   Oral   Take 1 mg by mouth daily.         Marland Kitchen  gabapentin (NEURONTIN) 300 MG capsule   Oral   Take 300 mg by mouth 3 (three) times daily as needed (leg pain).          Marland Kitchen ibuprofen (ADVIL,MOTRIN) 200 MG tablet   Oral   Take 400 mg by mouth every 6 (six) hours as needed for mild pain. Reported on 12/07/2015         . ipratropium-albuterol (DUONEB) 0.5-2.5 (3) MG/3ML SOLN   Nebulization   Take 3 mLs by nebulization every 6 (six) hours as needed (for shortness of breath).          . metoprolol tartrate (LOPRESSOR) 25 MG tablet   Oral   Take 25 mg by mouth 2 (two) times daily. Reported on 12/07/2015         . nicotine (NICODERM CQ - DOSED IN MG/24 HOURS) 14 mg/24hr patch   Transdermal   Place 14 mg onto the skin daily.         Marland Kitchen omeprazole (PRILOSEC) 20 MG capsule   Oral   Take 20 mg by mouth 2 (two) times daily.          Marland Kitchen oxyCODONE-acetaminophen (PERCOCET) 10-325 MG tablet   Oral   Take 1 tablet by mouth every 6 (six) hours as needed for pain.   30 tablet   0   . QUEtiapine (SEROQUEL) 100 MG tablet   Oral   Take 1.5 tablets (150 mg total) by mouth at bedtime. Patient taking differently: Take 75 mg by mouth at bedtime.    60 tablet   0   . vitamin C (ASCORBIC ACID) 500 MG tablet   Oral   Take 500 mg by mouth daily.         . zoledronic acid (RECLAST) 5 MG/100ML SOLN injection   Intravenous   Inject 5 mg into the vein See admin instructions. Reported on 12/07/2015           Allergies:  Gold-containing drug products; Adhesive; Dilaudid; Haldol; and Pravastatin  Family  History: Family History  Problem Relation Age of Onset  . CAD Father   . Alcohol abuse Father   . CAD Mother   . Hypertension Mother   . Peripheral vascular disease Mother   . Breast cancer Maternal Aunt 50  . Alcohol abuse Sister     Social History: Social History  Substance Use Topics  . Smoking status: Former Smoker -- 1.00 packs/day for 53 years    Types: Cigarettes    Quit date: 07/25/2015  . Smokeless tobacco: Former Neurosurgeon    Quit date: 07/15/2015  . Alcohol Use: No     Review of Systems:   10 point review of systems was performed and was otherwise negative:  Constitutional: No fever Eyes: No visual disturbances ENT: No sore throat, ear pain Cardiac: No chest pain Respiratory: No shortness of breath, wheezing, or stridor Abdomen: No abdominal pain, no vomiting, No diarrhea Endocrine: No weight loss, No night sweats Extremities: No peripheral edema, cyanosis Skin: No rashes, easy bruising Neurologic: No focal weakness, trouble with speech or swollowing Urologic: No dysuria, Hematuria, or urinary frequency   Physical Exam:  ED Triage Vitals  Enc Vitals Group     BP 02/04/16 0618 127/69 mmHg     Pulse Rate 02/04/16 0618 98     Resp 02/04/16 0618 16     Temp 02/04/16 0618 97.9 F (36.6 C)     Temp Source 02/04/16 0618 Oral     SpO2 02/04/16 0618 97 %  Weight 02/04/16 0618 126 lb (57.153 kg)     Height 02/04/16 0618 5\' 2"  (1.575 m)     Head Cir --      Peak Flow --      Pain Score 02/04/16 0629 8     Pain Loc --      Pain Edu? --      Excl. in GC? --     General: Awake , Alert , and Oriented times 3; GCS 15 Head: Normal cephalic , atraumatic Eyes: Pupils equal , round, reactive to light Nose/Throat: No nasal drainage, patent upper airway without erythema or exudate.  Neck: Supple, Full range of motion, No anterior adenopathy or palpable thyroid masses Lungs: Clear to ascultation without wheezes , rhonchi, or rales Heart: Regular rate, regular  rhythm without murmurs , gallops , or rubs Abdomen: Soft, non tender without rebound, guarding , or rigidity; bowel sounds positive and symmetric in all 4 quadrants. No organomegaly .        Extremities: 2 plus symmetric pulses. No edema, clubbing or cyanosis Neurologic: normal ambulation, She is able lift both lower extremities up off the stretcher against resistance and sensory is intact. Skin: warm, dry, no rashes Patient has reproducible pain with palpation across the upper lumbar region. There is no crepitus or step-off noted  Labs:   All laboratory work was reviewed including any pertinent negatives or positives listed below:  Labs Reviewed  COMPREHENSIVE METABOLIC PANEL - Abnormal; Notable for the following:    Potassium 3.4 (*)    Glucose, Bld 131 (*)    BUN 27 (*)    Calcium 8.5 (*)    Albumin 3.3 (*)    All other components within normal limits  CBC WITH DIFFERENTIAL/PLATELET - Abnormal; Notable for the following:    WBC 25.2 (*)    RDW 17.7 (*)    Neutro Abs 20.9 (*)    Monocytes Absolute 1.5 (*)    All other components within normal limits  PROTIME-INR - Abnormal; Notable for the following:    Prothrombin Time 15.2 (*)    All other components within normal limits  URINALYSIS COMPLETEWITH MICROSCOPIC ([facility] ONLY) - Abnormal; Notable for the following:    Color, Urine AMBER (*)    APPearance HAZY (*)    Specific Gravity, Urine 1.031 (*)    Protein, ur 30 (*)    Nitrite POSITIVE (*)    Leukocytes, UA 3+ (*)    Bacteria, UA RARE (*)    Squamous Epithelial / LPF 0-5 (*)    All other components within normal limits  URINE CULTURE  Laboratory work shows an elevated white count along with findings indicative of a urinary tract infection Urine culture is pending  EKG:  ED ECG REPORT I, 04/06/16, the attending physician, personally viewed and interpreted this ECG.  Date: 02/04/2016 EKG Time: 0635 Rate: 95 Rhythm: normal sinus rhythm QRS Axis: Low voltage in  the precordial leads Intervals: normal ST/T Wave abnormalities: normal Conduction Disturbances: none Narrative Interpretation: unremarkable No acute ischemic changes   Radiology:    CT Thoracic Spine Wo Contrast (Final result) Result time: 02/04/16 10:22:34   Final result by Rad Results In Interface (02/04/16 10:22:34)   Narrative:   CLINICAL DATA: 70 year old female status post fall with thoracic back pain. Initial encounter.  EXAM: CT THORACIC SPINE WITHOUT CONTRAST  TECHNIQUE: Multidetector CT imaging of the thoracic spine was performed without intravenous contrast administration. Multiplanar CT image reconstructions were also generated.  COMPARISON: Chest  radiographs 11/10/2015. Chest CTA 02/26/2015.  FINDINGS: Osteopenia. Congenital incomplete segmentation of T1-T2. Partially visible chronic L1 transpedicular fusion hardware.  Sequelae of augmented T6 and L1 compression fractures is unchanged since 2016.  A severe T5 compression fracture is new since that time, but stable since the chest radiographs in April.  Mild to moderate superior endplate compression of T1 is new since 2016, and was not well visualized on the more recent radiographs. The compressed superior endplate is sclerotic (sagittal image 23). There is congenital interbody and some posterior element fusion of T1-T2.  There is a comminuted fracture of the T12 vertebral body (sagittal image 23) with loss of height up to 30%. There is retropulsion of the posterior T12 vertebral body narrowing the AP spinal canal to 10 mm (versus 13-14 mm at an unaffected level). Fracture extends through the junction of the vertebral body and pedicles, but the T12 pedicles and posterior elements otherwise appear intact.  Other thoracic vertebrae appear intact. Chronic posterior left fourth rib fracture is new since 2016. Chronic posterior right twelfth rib fracture is new since 2016. No acute displaced  posterior rib fracture identified.  Calcified aortic and coronary artery atherosclerosis. No pericardial effusion. Negative visualized noncontrast upper abdominal viscera.  Increased septal thickening throughout the lungs compare to 2016. Increased dependent opacity most resembling atelectasis.  IMPRESSION: 1. Acute appearing comminuted T12 vertebral body fracture - a complex compression or mild burst-type fracture. Recommend Spine Surgery consultation. Retropulsion of bone resulting in mild spinal stenosis. Loss of height up to 30%. Underlying severe osteopenia. 2. Severe T5 compression fracture is stable since April. Mild T1 superior endplate compression fracture is favored to be subacute in light of endplate sclerosis. 3. Chronically augmented T6 and L1 compression fractures. Occasional chronic posterior rib fractures. No acute posterior rib fracture identified. 4. Dependent bilateral pulmonary atelectasis. Increased pulmonary septal thickening suggesting a degree of interstitial edema. 5. Calcified aortic atherosclerosis.   Electronically Signed By: Odessa Fleming M.D. On: 02/04/2016 10:22          DG Lumbar Spine 2-3 Views (Final result) Result time: 02/04/16 07:40:59   Final result by Rad Results In Interface (02/04/16 07:40:59)   Narrative:   CLINICAL DATA: Pain following fall. Radicular symptoms left lower extremity  EXAM: LUMBAR SPINE - 2-3 VIEW  COMPARISON: November 10, 2015  FINDINGS: Frontal, lateral, and spot lumbosacral lateral images were obtained. Bones are osteoporotic. There are 5 non-rib-bearing lumbar type vertebral bodies. Patient has had previous vertebroplasty at L1, stable. There is postoperative change with screw and plate fixation at L2, L3, L4 with a disc spacer at L3-4. The support hardware appears intact and stable.  There is a new anterior wedge compression fracture of the T12 vertebral body. No other new fracture evident. There is  no spondylolisthesis. There is degenerative disc disease at all levels, stable. Disc space narrowing is most marked at L1-2. There is atherosclerotic calcification in the aorta.  IMPRESSION: There is a new fracture of the T12 vertebral body with moderate anterior wedging at T12. No other new fracture. Patient has had previous vertebroplasty at L1. No spondylolisthesis. Extensive postoperative change and arthropathy, stable. Bones osteoporotic. There is extensive atherosclerotic calcification in the aorta.   Electronically Signed By: Bretta Bang III M.D. On: 02/04/2016 07:40        ECG Results        I personally reviewed the radiologic studies   ED Course: Patient's stay here showed little improvement as far as pain control goes from  what appears to be a comminuted burst fracture of the T12 region. I spoke to neurosurgery on call at [facility] who was able to review her CT and given advice on splinting. Patient had a TLSO splint applied by the biotech personnel. Patient also was found to have a urinary tract infection and was started on IV Rocephin with urine culture pending. She received a series of morphine injections with some mild pain control but is still uncomfortable.   Assessment:  Compression fracture T12 Previous lumbar surgery Urinary tract infection     Plan: * Inpatient management            Jennye Moccasin, MD 02/04/16 1606

## 2016-02-04 NOTE — ED Notes (Signed)
Pt placed on 2L [place], pt wears home 02 

## 2016-02-04 NOTE — Progress Notes (Signed)
Spoke to pharmacist to adjust and correct a few of the patient's medications. Cymbalta 60mg  in AM; 30mg  in PM Seroquel 150mg  at bedtime No longer taking metoprolol or depakote.

## 2016-02-04 NOTE — ED Notes (Signed)
Pt updated on plan of care and possible transfer, reassured pt when plans were made for admission she would be updated, pt and family state understanding

## 2016-02-04 NOTE — ED Notes (Signed)
Pt assisted to bed pan, family at bedside, pt in no distress

## 2016-02-04 NOTE — ED Notes (Signed)
Skin tear on left arm cleaned and dressed

## 2016-02-05 ENCOUNTER — Inpatient Hospital Stay: Payer: Medicare Other

## 2016-02-05 LAB — COMPREHENSIVE METABOLIC PANEL
ALBUMIN: 2.9 g/dL — AB (ref 3.5–5.0)
ALT: 17 U/L (ref 14–54)
AST: 22 U/L (ref 15–41)
Alkaline Phosphatase: 121 U/L (ref 38–126)
Anion gap: 7 (ref 5–15)
BILIRUBIN TOTAL: 0.7 mg/dL (ref 0.3–1.2)
BUN: 16 mg/dL (ref 6–20)
CO2: 25 mmol/L (ref 22–32)
CREATININE: 0.48 mg/dL (ref 0.44–1.00)
Calcium: 8.5 mg/dL — ABNORMAL LOW (ref 8.9–10.3)
Chloride: 105 mmol/L (ref 101–111)
GFR calc Af Amer: >60 mL/min (ref >60)
GLUCOSE: 144 mg/dL — AB (ref 65–99)
POTASSIUM: 3.5 mmol/L (ref 3.5–5.1)
Sodium: 137 mmol/L (ref 135–145)
TOTAL PROTEIN: 6.8 g/dL (ref 6.5–8.1)

## 2016-02-05 LAB — GLUCOSE, CAPILLARY
Glucose-Capillary: 141 mg/dL — ABNORMAL HIGH (ref 65–99)
Glucose-Capillary: 160 mg/dL — ABNORMAL HIGH (ref 65–99)

## 2016-02-05 LAB — CBC
HEMATOCRIT: 34 % — AB (ref 35.0–47.0)
Hemoglobin: 11.2 g/dL — ABNORMAL LOW (ref 12.0–16.0)
MCH: 30.2 pg (ref 26.0–34.0)
MCHC: 33 g/dL (ref 32.0–36.0)
MCV: 91.3 fL (ref 80.0–100.0)
PLATELETS: 394 10*3/uL (ref 150–440)
RBC: 3.73 MIL/uL — ABNORMAL LOW (ref 3.80–5.20)
RDW: 17.7 % — AB (ref 11.5–14.5)
WBC: 18.3 10*3/uL — ABNORMAL HIGH (ref 3.6–11.0)

## 2016-02-05 MED ORDER — SENNOSIDES-DOCUSATE SODIUM 8.6-50 MG PO TABS
1.0000 | ORAL_TABLET | Freq: Two times a day (BID) | ORAL | Status: DC
  Administered 2016-02-05: 1 via ORAL
  Filled 2016-02-05: qty 1

## 2016-02-05 MED ORDER — MORPHINE SULFATE (PF) 2 MG/ML IV SOLN
2.0000 mg | Freq: Once | INTRAVENOUS | Status: AC
  Administered 2016-02-05: 2 mg via INTRAVENOUS
  Filled 2016-02-05: qty 1

## 2016-02-05 NOTE — Care Management Important Message (Signed)
Important Message  Patient Details  Name: Michelle Cabrera MRN: [id number] Date of Birth: 1946/03/21   Medicare Important Message Given:  Yes    Adonis Huguenin, RN 02/05/2016, 10:27 AM

## 2016-02-05 NOTE — Progress Notes (Signed)
Initial Nutrition Assessment      INTERVENTION:  -Monitor intake and cater to pt preferences -If unable to meet nutritional needs, recommend adding Ensure Enlive po BID, each supplement provides 350 kcal and 20 grams of protein    NUTRITION DIAGNOSIS:   Inadequate oral intake related to acute illness as evidenced by per patient/family report.    GOAL:   Patient will meet greater than or equal to 90% of their needs    MONITOR:   PO intake  REASON FOR ASSESSMENT:   Malnutrition Screening Tool    ASSESSMENT:      Pt admitted with fall and back pain.  Pt out of room for MRI during visit this am.    Past Medical History  Diagnosis Date  . Hypertension   . Closed compression fracture of L1 lumbar vertebra (HCC) 11/2012  . COPD (chronic obstructive pulmonary disease) (HCC)   . Anxiety   . Depression   . GERD (gastroesophageal reflux disease)   . Rheumatoid arthritis (HCC)   . DDD (degenerative disc disease)   . Chronic lower back pain   . Collagen vascular disease (HCC)    Husband reports appetite has been fair prior to admission.  Typically eats cereal for breakfast, sandwich for lunch and good dinner.  Often snacks at night.  Today ate toast with peanut butter for breakfast and ate about 50% of lunch today.    Medications reviewed: colace, folic acid, vit c, NS with KCL at 26ml/hr Labs reviewed: glucose 144   Unable to complete Nutrition-Focused physical exam at this time.    Diet Order:  Diet Heart Room service appropriate?: Yes; Fluid consistency:: Thin  Skin:  Reviewed, no issues  Last BM:  7/7  Height:   Ht Readings from Last 1 Encounters:  02/04/16 5\' 2"  (1.575 m)    Weight: 9% wt loss in the last 5 months per wt encounters.   Wt Readings from Last 1 Encounters:  02/05/16 131 lb 6.4 oz (59.603 kg)    Ideal Body Weight:     BMI:  Body mass index is 24.03 kg/(m^2).  Estimated Nutritional Needs:   Kcal:  1475-1770 kcals/d  Protein:   71-89 g/d  Fluid:  >/=14106ml/d  EDUCATION NEEDS:   No education needs identified at this time  Jabreel Chimento B. 72m, RD, LDN [phone] (pager) Weekend/On-Call pager [phone])

## 2016-02-05 NOTE — Progress Notes (Signed)
[facility] Physicians - [place] at [facility]   PATIENT NAME: Michelle Cabrera    MR#:  [id number]  DATE OF BIRTH:  1945/08/26  SUBJECTIVE:  CHIEF COMPLAINT:   Chief Complaint  Patient presents with  . Fall   Better back pain after given iv morphine. REVIEW OF SYSTEMS:  CONSTITUTIONAL: No fever, fatigue or weakness.  EYES: No blurred or double vision.  EARS, NOSE, AND THROAT: No tinnitus or ear pain.  RESPIRATORY: No cough, shortness of breath, wheezing or hemoptysis.  CARDIOVASCULAR: No chest pain, orthopnea, edema.  GASTROINTESTINAL: No nausea, vomiting, diarrhea or abdominal pain.  GENITOURINARY: No dysuria, hematuria.  ENDOCRINE: No polyuria, nocturia,  HEMATOLOGY: No anemia, easy bruising or bleeding SKIN: No rash or lesion. MUSCULOSKELETAL: back pain.   NEUROLOGIC: No tingling, numbness, weakness.  PSYCHIATRY: No anxiety or depression.   DRUG ALLERGIES:   Allergies  Allergen Reactions  . Gold-Containing Drug Products Anaphylaxis  . Other     Other reaction(s): Unknown  . Adhesive [Tape]     Tears skin  . Dilaudid [Hydromorphone Hcl]     Made tongue swell  . Haldol [Haloperidol] Other (See Comments)    Hallucinations  . Pravastatin     Arms and legs hurt     VITALS:  Blood pressure 159/83, pulse 101, temperature 98.4 F (36.9 C), temperature source Oral, resp. rate 20, height 5\' 2"  (1.575 m), weight 131 lb 6.4 oz (59.603 kg), SpO2 97 %.  PHYSICAL EXAMINATION:  GENERAL:  70 y.o.-year-old patient lying in the bed with no acute distress.  EYES: Pupils equal, round, reactive to light and accommodation. No scleral icterus. Extraocular muscles intact.  HEENT: Head atraumatic, normocephalic. Oropharynx and nasopharynx clear.  NECK:  Supple, no jugular venous distention. No thyroid enlargement, no tenderness.  LUNGS: Normal breath sounds bilaterally, no wheezing, rales,rhonchi or crepitation. No use of accessory muscles of respiration.   CARDIOVASCULAR: S1, S2 normal. No murmurs, rubs, or gallops.  ABDOMEN: Soft, nontender, nondistended. Bowel sounds present. No organomegaly or mass.  EXTREMITIES: No pedal edema, cyanosis, or clubbing.  NEUROLOGIC: Cranial nerves II through XII are intact. Muscle strength 5/5 in all extremities. Sensation intact. Gait not checked.  PSYCHIATRIC: The patient is alert and oriented x 3.  SKIN: No obvious rash, lesion, or ulcer.    LABORATORY PANEL:   CBC  Recent Labs Lab 02/05/16 0343  WBC 18.3*  HGB 11.2*  HCT 34.0*  PLT 394   ------------------------------------------------------------------------------------------------------------------  Chemistries   Recent Labs Lab 02/05/16 0343  NA 137  K 3.5  CL 105  CO2 25  GLUCOSE 144*  BUN 16  CREATININE 0.48  CALCIUM 8.5*  AST 22  ALT 17  ALKPHOS 121  BILITOT 0.7   ------------------------------------------------------------------------------------------------------------------  Cardiac Enzymes No results for input(s): TROPONINI in the last 168 hours. ------------------------------------------------------------------------------------------------------------------  RADIOLOGY:  Dg Lumbar Spine 2-3 Views  02/04/2016  CLINICAL DATA:  Pain following fall. Radicular symptoms left lower extremity EXAM: LUMBAR SPINE - 2-3 VIEW COMPARISON:  November 10, 2015 FINDINGS: Frontal, lateral, and spot lumbosacral lateral images were obtained. Bones are osteoporotic. There are 5 non-rib-bearing lumbar type vertebral bodies. Patient has had previous vertebroplasty at L1, stable. There is postoperative change with screw and plate fixation at L2, L3, L4 with a disc spacer at L3-4. The support hardware appears intact and stable. There is a new anterior wedge compression fracture of the T12 vertebral body. No other new fracture evident. There is no spondylolisthesis. There is degenerative disc disease at  all levels, stable. Disc space narrowing is  most marked at L1-2. There is atherosclerotic calcification in the aorta. IMPRESSION: There is a new fracture of the T12 vertebral body with moderate anterior wedging at T12. No other new fracture. Patient has had previous vertebroplasty at L1. No spondylolisthesis. Extensive postoperative change and arthropathy, stable. Bones osteoporotic. There is extensive atherosclerotic calcification in the aorta. Electronically Signed   By: Bretta Bang III M.D.   On: 02/04/2016 07:40   Ct Thoracic Spine Wo Contrast  02/04/2016  CLINICAL DATA:  70 year old female status post fall with thoracic back pain. Initial encounter. EXAM: CT THORACIC SPINE WITHOUT CONTRAST TECHNIQUE: Multidetector CT imaging of the thoracic spine was performed without intravenous contrast administration. Multiplanar CT image reconstructions were also generated. COMPARISON:  Chest radiographs 11/10/2015.  Chest CTA 02/26/2015. FINDINGS: Osteopenia. Congenital incomplete segmentation of T1-T2. Partially visible chronic L1 transpedicular fusion hardware. Sequelae of augmented T6 and L1 compression fractures is unchanged since 2016. A severe T5 compression fracture is new since that time, but stable since the chest radiographs in April. Mild to moderate superior endplate compression of T1 is new since 2016, and was not well visualized on the more recent radiographs. The compressed superior endplate is sclerotic (sagittal image 23). There is congenital interbody and some posterior element fusion of T1-T2. There is a comminuted fracture of the T12 vertebral body (sagittal image 23) with loss of height up to 30%. There is retropulsion of the posterior T12 vertebral body narrowing the AP spinal canal to 10 mm (versus 13-14 mm at an unaffected level). Fracture extends through the junction of the vertebral body and pedicles, but the T12 pedicles and posterior elements otherwise appear intact. Other thoracic vertebrae appear intact. Chronic posterior left  fourth rib fracture is new since 2016. Chronic posterior right twelfth rib fracture is new since 2016. No acute displaced posterior rib fracture identified. Calcified aortic and coronary artery atherosclerosis. No pericardial effusion. Negative visualized noncontrast upper abdominal viscera. Increased septal thickening throughout the lungs compare to 2016. Increased dependent opacity most resembling atelectasis. IMPRESSION: 1. Acute appearing comminuted T12 vertebral body fracture - a complex compression or mild burst-type fracture. Recommend Spine Surgery consultation. Retropulsion of bone resulting in mild spinal stenosis. Loss of height up to 30%. Underlying severe osteopenia. 2. Severe T5 compression fracture is stable since April. Mild T1 superior endplate compression fracture is favored to be subacute in light of endplate sclerosis. 3. Chronically augmented T6 and L1 compression fractures. Occasional chronic posterior rib fractures. No acute posterior rib fracture identified. 4. Dependent bilateral pulmonary atelectasis. Increased pulmonary septal thickening suggesting a degree of interstitial edema. 5. Calcified aortic atherosclerosis. Electronically Signed   By: Odessa Fleming M.D.   On: 02/04/2016 10:22    EKG:   Orders placed or performed during the hospital encounter of 02/04/16  . EKG 12-Lead  . EKG 12-Lead  . ED EKG  . ED EKG    ASSESSMENT AND PLAN:   Thoracic compression fracture.  pain meds as needed. kyphoplasty in am per Dr. Rosita Kea.  Intractable low back pain. Pain control.  UTI (lower urinary tract infection) with leukocytosis. Continue rocephin, f/u CBC and U/C.  Chronic respiratory failure and COPD. Stable, on home O2 [place] 2 L, neb prn.  I discussed with Dr. Rosita Kea. All the records are reviewed and case discussed with Care Management/Social Workerr. Management plans discussed with the patient, sister and they are in agreement.  CODE STATUS: full code.  TOTAL TIME TAKING CARE  OF THIS PATIENT: 35 minutes.  Greater than 50% time was spent on coordination of care and face-to-face counseling.  POSSIBLE D/C IN 2 DAYS, DEPENDING ON CLINICAL CONDITION.   Shaune Pollack M.D on 02/05/2016 at 12:06 PM  Between 7am to 6pm - Pager - [phone]  After 6pm go to [email or web address] - password [email or web address] [facility]  [place] [facility]  Office  [phone]  CC: Primary care physician; Lauro Regulus., MD

## 2016-02-05 NOTE — Progress Notes (Addendum)
Pt reports she takes 2 injections every Friday, but she forgot to take them this past Friday. Reviewed home medication orders, pt takes Methotrexate and Etanercept. Family will bring medications tomorrow for pharmacy to review as they are not carried by our pharmacy. Plan to contact MD in AM to obtain orders.

## 2016-02-05 NOTE — Clinical Social Work Note (Signed)
Clinical Social Work Assessment  Patient Details  Name: Michelle Cabrera MRN: [id number] Date of Birth: 1945/11/21  Date of referral:  02/05/16               Reason for consult:  Facility Placement                Permission sought to share information with:  Chartered certified accountant granted to share information::  Yes, Verbal Permission Granted  Name::      [place]::   [place]   Relationship::     Contact Information:     Housing/Transportation Living arrangements for the past 2 months:  Single Family Home Source of Information:  Patient, Other (Comment Required) (Sister Jewel. ) Patient Interpreter Needed:  None Criminal Activity/Legal Involvement Pertinent to Current Situation/Hospitalization:  No - Comment as needed Significant Relationships:  Siblings, Spouse Lives with:  Spouse Do you feel safe going back to the place where you live?  Yes Need for family participation in patient care:  Yes (Comment)  Care giving concerns:  Patient lives in [place] with her husband Fraser.    Social Worker assessment / plan:  Holiday representative (CSW) received SNF consult. PT and Ortho consult are pending. Per RN in progression rounds Dr. Rudene Christians is going to evaluate patient for a potential kypho. CSW met with patient and her sister Jewel was at bedside. Patient was alert and oriented and was laying in the bed. CSW introduced self and explained role of CSW department. Patient reported that she lives in [place] in [place] with her husband [place]. Patient reported that she has had back problems for a while. CSW explained that Ortho MD and PT will evaluate patient and make a recommendation of home health or SNF. Patient reported that she prefers to go home and has been to SNF before. Patient is agreeable to SNF search in [facility]. CSW explained that patient's Blue Medicare will have to approve SNF stay. Patient and sister  verbalized their understanding.   FL2 complete and faxed out. CSW will continue to follow and assist as needed.   Employment status:  Disabled (Comment on whether or not currently receiving Disability), Retired Nurse, adult PT Recommendations:  Not assessed at this time Information / Referral to community resources:  [place]  Patient/Family's Response to care:  Patient prefers to go home.   Patient/Family's Understanding of and Emotional Response to Diagnosis, Current Treatment, and Prognosis:  Patient was pleasant and thanked CSW for visit.   Emotional Assessment Appearance:  Appears stated age Attitude/Demeanor/Rapport:    Affect (typically observed):  Accepting, Adaptable, Pleasant Orientation:  Oriented to Self, Oriented to Place, Oriented to  Time, Oriented to Situation Alcohol / Substance use:  Not Applicable Psych involvement (Current and /or in the community):  No (Comment)  Discharge Needs  Concerns to be addressed:  Discharge Planning Concerns Readmission within the last 30 days:  No Current discharge risk:  Dependent with Mobility Barriers to Discharge:  Continued Medical Work up   Loralyn Freshwater, LCSW 02/05/2016, 11:41 AM

## 2016-02-05 NOTE — Progress Notes (Signed)
   02/05/16 1400  Clinical Encounter Type  Visited With Patient  Visit Type Initial  Consult/Referral To Chaplain  Rounded in unit and offered pastoral care.   Chaplain Artha Chiasson Ext:[phone] 

## 2016-02-05 NOTE — Clinical Social Work Placement (Signed)
   CLINICAL SOCIAL WORK PLACEMENT  NOTE  Date:  02/05/2016  Patient Details  Name: Michelle Cabrera MRN: [id number] Date of Birth: 1946-01-12  Clinical Social Work is seeking post-discharge placement for this patient at the Skilled  Nursing Facility level of care (*CSW will initial, date and re-position this form in  chart as items are completed):  Yes   Patient/family provided with [place] Clinical Social Work Department's list of facilities offering this level of care within the geographic area requested by the patient (or if unable, by the patient's family).  Yes   Patient/family informed of their freedom to choose among providers that offer the needed level of care, that participate in Medicare, Medicaid or managed care program needed by the patient, have an available bed and are willing to accept the patient.  Yes   Patient/family informed of [place]'s ownership interest in [facility] and [facility], as well as of the fact that they are under no obligation to receive care at these facilities.  PASRR submitted to EDS on 02/05/16 (PASARR was expired and a new PASARR was submitted )     PASRR number received on       Existing PASRR number confirmed on       FL2 transmitted to all facilities in geographic area requested by pt/family on 02/05/16     FL2 transmitted to all facilities within larger geographic area on       Patient informed that his/her managed care company has contracts with or will negotiate with certain facilities, including the following:            Patient/family informed of bed offers received.  Patient chooses bed at       Physician recommends and patient chooses bed at      Patient to be transferred to   on  .  Patient to be transferred to facility by       Patient family notified on   of transfer.  Name of family member notified:        PHYSICIAN       Additional Comment:     _______________________________________________ Haig Prophet, LCSW 02/05/2016, 11:39 AM

## 2016-02-05 NOTE — Clinical Social Work Placement (Signed)
   CLINICAL SOCIAL WORK PLACEMENT  NOTE  Date:  02/05/2016  Patient Details  Name: NABEEHA BADERTSCHER MRN: [id number] Date of Birth: 06-25-1946  Clinical Social Work is seeking post-discharge placement for this patient at the Skilled  Nursing Facility level of care (*CSW will initial, date and re-position this form in  chart as items are completed):  Yes   Patient/family provided with [place] Clinical Social Work Department's list of facilities offering this level of care within the geographic area requested by the patient (or if unable, by the patient's family).  Yes   Patient/family informed of their freedom to choose among providers that offer the needed level of care, that participate in Medicare, Medicaid or managed care program needed by the patient, have an available bed and are willing to accept the patient.  Yes   Patient/family informed of [place]'s ownership interest in [facility] and [facility], as well as of the fact that they are under no obligation to receive care at these facilities.  PASRR submitted to EDS on 02/05/16 (PASARR was expired and a new PASARR was submitted )     PASRR number received on 02/05/16 ([id number] E Expires 03/06/2016)     Existing PASRR number confirmed on       FL2 transmitted to all facilities in geographic area requested by pt/family on 02/05/16     FL2 transmitted to all facilities within larger geographic area on       Patient informed that his/her managed care company has contracts with or will negotiate with certain facilities, including the following:            Patient/family informed of bed offers received.  Patient chooses bed at       Physician recommends and patient chooses bed at      Patient to be transferred to   on  .  Patient to be transferred to facility by       Patient family notified on   of transfer.  Name of family member notified:        PHYSICIAN       Additional Comment:     _______________________________________________ Haig Prophet, LCSW 02/05/2016, 1:24 PM

## 2016-02-05 NOTE — Progress Notes (Addendum)
Patient seen, MRI ordered. Have to wait until Thursday for kyphoplasty secondary to Plavix. Patient is to have MRI, evaluation of additional levels and possible hematoma prior to procedure. By Thursday should be okay off of Plavix

## 2016-02-05 NOTE — Care Management Note (Signed)
Case Management Note  Patient Details  Name: Michelle Cabrera MRN: [id number] Date of Birth: 1946-03-25  Subjective/Objective:     Spoke with patient for discharge planning. Patient is alert and oriented from home with her husband. Sister is present in room.  Has Home O2 with [employer]. Patient stated that she has Rollator, BSC, and Shower chair at home. Sister helps with care at home.    PCP is Dr Dareen Piano.  Patient is open to [facility]. PT and RN.  PT evaluation ordered and Ortho Consult   Also.  Continue to follow.         Action/Plan: Anticipated discharge plan at this point is SNF. Will need further evaulations. CONTINUE TO FOLLOW   Expected Discharge Date:                  Expected Discharge Plan:  Skilled Nursing Facility  In-House Referral:  Clinical Social Work  Discharge planning Services  CM Consult  Post Acute Care Choice:    Choice offered to:     DME Arranged:    DME Agency:     HH Arranged:    HH Agency:     Status of Service:  In process, will continue to follow  If discussed at Long Length of Stay Meetings, dates discussed:    Additional Comments:  Adonis Huguenin, RN 02/05/2016, 11:39 AM

## 2016-02-05 NOTE — [place] FL2 (Signed)
[place] MEDICAID FL2 LEVEL OF CARE SCREENING TOOL     IDENTIFICATION  Patient Name: Michelle Cabrera Birthdate: 02/24/1946 Sex: female Admission Date (Current Location): 02/04/2016  [place] and [place] Number:  Chiropodist and Address:  [facility], [address] [place], [place] [address]      Provider Number: [id number]  Attending Physician Name and Address:  Shaune Pollack, MD  Relative Name and Phone Number:       Current Level of Care: Hospital Recommended Level of Care: Skilled Nursing Facility Prior Approval Number:    Date Approved/Denied:   PASRR Number:    Discharge Plan: SNF    Current Diagnoses: Patient Active Problem List   Diagnosis Date Noted  . Thoracic compression fracture (HCC) 02/04/2016  . UTI (lower urinary tract infection) 02/04/2016  . Schizophrenia spectrum disorder with psychotic disorder type not yet determined (HCC) 11/13/2015  . Syncope 11/10/2015  . Hypotension 11/10/2015  . 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 Pressure ulcer 09/08/2015  . New onset type 2 diabetes mellitus (HCC) 09/06/2015  . Altered mental status 09/06/2015  . COPD exacerbation (HCC) 07/26/2015  . Arthritis associated with another disorder 09/15/2014  . Wedge compression fracture of T6 vertebra (HCC) 02/07/2014  . Intractable low back pain 12/27/2012  . DDD (degenerative disc disease) 12/27/2012  . Rheumatoid arthritis (HCC) 12/27/2012  . Hypertension 12/27/2012    Orientation RESPIRATION BLADDER Height & Weight     Self, Time, Situation, Place  O2 (2 Liters Oxygen ) Continent Weight: 131 lb 6.4 oz (59.603 kg) Height:  5\' 2"  (157.5 cm)  BEHAVIORAL SYMPTOMS/MOOD NEUROLOGICAL BOWEL NUTRITION STATUS   (none )  (none ) Continent Diet (Diet: Heart Healthy )  AMBULATORY STATUS COMMUNICATION OF NEEDS Skin   Extensive Assist Verbally Normal                       Personal Care Assistance Level of Assistance  Bathing, Feeding,  Dressing Bathing Assistance: Limited assistance Feeding assistance: Independent Dressing Assistance: Limited assistance     Functional Limitations Info  Sight, Hearing, Speech Sight Info: Adequate Hearing Info: Adequate Speech Info: Adequate    SPECIAL CARE FACTORS FREQUENCY  PT (By licensed PT), OT (By licensed OT)     PT Frequency:  (5) OT Frequency:  (5)            Contractures      Additional Factors Info  Code Status, Allergies Code Status Info:  (Full Code. ) Allergies Info:  (Gold-containing Drug Products, Other, Adhesive, Dilaudid, Haldol, Pravastatin)           Current Medications (02/05/2016):  This is the current hospital active medication list Current Facility-Administered Medications  Medication Dose Route Frequency Provider Last Rate Last Dose  . 0.9 % NaCl with KCl 20 mEq/ L  infusion   Intravenous Continuous 04/07/2016, MD 100 mL/hr at 02/05/16 0600    . acetaminophen (TYLENOL) tablet 650 mg  650 mg Oral Q6H PRN 04/07/16, MD       Or  . acetaminophen (TYLENOL) suppository 650 mg  650 mg Rectal Q6H PRN Marguarite Arbour, MD      . albuterol (PROVENTIL) (2.5 MG/3ML) 0.083% nebulizer solution 2.5 mg  2.5 mg Nebulization Q6H PRN Marguarite Arbour, MD      . aspirin EC tablet 81 mg  81 mg Oral Daily Marguarite Arbour, MD   81 mg at 02/05/16 0840  . bisacodyl (  DULCOLAX) suppository 10 mg  10 mg Rectal Daily PRN Marguarite Arbour, MD      . calcium citrate-vitamin D 500-400 MG-UNIT per chewable tablet 1 tablet  1 tablet Oral BID Marguarite Arbour, MD   1 tablet at 02/05/16 0841  . cefTRIAXone (ROCEPHIN) 1 g in dextrose 5 % 50 mL IVPB  1 g Intravenous Q24H Marguarite Arbour, MD      . cyclobenzaprine (FLEXERIL) tablet 5 mg  5 mg Oral Q8H PRN Gery Pray, MD   5 mg at 02/05/16 0856  . docusate sodium (COLACE) capsule 100 mg  100 mg Oral BID Marguarite Arbour, MD   100 mg at 02/05/16 0841  . DULoxetine (CYMBALTA) DR capsule 30 mg  30 mg Oral QHS Marguarite Arbour,  MD   30 mg at 02/04/16 2140  . DULoxetine (CYMBALTA) DR capsule 60 mg  60 mg Oral q morning - 10a Marguarite Arbour, MD   60 mg at 02/05/16 0839  . fluticasone furoate-vilanterol (BREO ELLIPTA) 100-25 MCG/INH 1 puff  1 puff Inhalation q morning - 10a Marguarite Arbour, MD   1 puff at 02/05/16 [phone]  . folic acid (FOLVITE) tablet 1 mg  1 mg Oral Daily Marguarite Arbour, MD   1 mg at 02/05/16 0841  . gabapentin (NEURONTIN) capsule 300 mg  300 mg Oral TID PRN Marguarite Arbour, MD   300 mg at 02/05/16 0045  . heparin injection 5,000 Units  5,000 Units Subcutaneous Q8H Marguarite Arbour, MD   5,000 Units at 02/05/16 0603  . ipratropium-albuterol (DUONEB) 0.5-2.5 (3) MG/3ML nebulizer solution 3 mL  3 mL Nebulization QID Marguarite Arbour, MD   3 mL at 02/05/16 0825  . morphine 2 MG/ML injection 2 mg  2 mg Intravenous Q2H PRN Marguarite Arbour, MD   2 mg at 02/05/16 0800  . ondansetron (ZOFRAN) tablet 4 mg  4 mg Oral Q6H PRN Marguarite Arbour, MD       Or  . ondansetron [facility]) injection 4 mg  4 mg Intravenous Q6H PRN Marguarite Arbour, MD      . oxyCODONE-acetaminophen (PERCOCET/ROXICET) 5-325 MG per tablet 1 tablet  1 tablet Oral Q4H PRN Marguarite Arbour, MD   1 tablet at 02/05/16 0840   And  . oxyCODONE (Oxy IR/ROXICODONE) immediate release tablet 5 mg  5 mg Oral Q4H PRN Marguarite Arbour, MD   5 mg at 02/05/16 0840  . pantoprazole (PROTONIX) EC tablet 40 mg  40 mg Oral Daily Marguarite Arbour, MD   40 mg at 02/05/16 0841  . QUEtiapine (SEROQUEL) tablet 150 mg  150 mg Oral QHS Marguarite Arbour, MD   150 mg at 02/04/16 2140  . vitamin C (ASCORBIC ACID) tablet 500 mg  500 mg Oral Daily Marguarite Arbour, MD   500 mg at 02/05/16 2297     Discharge Medications: Please see discharge summary for a list of discharge medications.  Relevant Imaging Results:  Relevant Lab Results:   Additional Information  (SSN: [id number])  Haig Prophet, LCSW

## 2016-02-06 LAB — GLUCOSE, CAPILLARY: GLUCOSE-CAPILLARY: 123 mg/dL — AB (ref 65–99)

## 2016-02-06 MED ORDER — METHOTREXATE SODIUM CHEMO INJECTION 50 MG/2ML
50.0000 mg/m2 | Freq: Once | INTRAMUSCULAR | Status: DC
  Filled 2016-02-06: qty 3.3

## 2016-02-06 MED ORDER — TAPENTADOL HCL 50 MG PO TABS
50.0000 mg | ORAL_TABLET | ORAL | Status: DC | PRN
  Administered 2016-02-06 – 2016-02-09 (×9): 50 mg via ORAL
  Filled 2016-02-06 (×9): qty 1

## 2016-02-06 MED ORDER — POLYETHYLENE GLYCOL 3350 17 G PO PACK
17.0000 g | PACK | Freq: Every day | ORAL | Status: DC
  Administered 2016-02-06 – 2016-02-09 (×3): 17 g via ORAL
  Filled 2016-02-06 (×3): qty 1

## 2016-02-06 MED ORDER — HYDRALAZINE HCL 20 MG/ML IJ SOLN
10.0000 mg | Freq: Four times a day (QID) | INTRAMUSCULAR | Status: DC | PRN
  Filled 2016-02-06: qty 1

## 2016-02-06 MED ORDER — SENNA 8.6 MG PO TABS
1.0000 | ORAL_TABLET | Freq: Every day | ORAL | Status: DC
  Administered 2016-02-06 – 2016-02-09 (×3): 8.6 mg via ORAL
  Filled 2016-02-06 (×4): qty 1

## 2016-02-06 MED ORDER — KETOROLAC TROMETHAMINE 30 MG/ML IJ SOLN
30.0000 mg | Freq: Four times a day (QID) | INTRAMUSCULAR | Status: DC | PRN
  Administered 2016-02-06 – 2016-02-08 (×2): 30 mg via INTRAVENOUS
  Filled 2016-02-06 (×2): qty 1

## 2016-02-06 MED ORDER — ETANERCEPT 50 MG/ML ~~LOC~~ SOSY
50.0000 mg | PREFILLED_SYRINGE | SUBCUTANEOUS | Status: DC
  Administered 2016-02-07: 50 mg via SUBCUTANEOUS
  Filled 2016-02-06: qty 0.98

## 2016-02-06 NOTE — Progress Notes (Signed)
[facility] Physicians - [place] at [facility]   PATIENT NAME: Michelle Cabrera    MR#:  [id number]  DATE OF BIRTH:  1945/11/11  SUBJECTIVE:  CHIEF COMPLAINT:   Chief Complaint  Patient presents with  . Fall   Continues to complain of severe back pain. Her blood pressure was elevated due to pain REVIEW OF SYSTEMS:  CONSTITUTIONAL: No fever, fatigue or weakness.  EYES: No blurred or double vision.  EARS, NOSE, AND THROAT: No tinnitus or ear pain.  RESPIRATORY: No cough, shortness of breath, wheezing or hemoptysis.  CARDIOVASCULAR: No chest pain, orthopnea, edema.  GASTROINTESTINAL: No nausea, vomiting, diarrhea or abdominal pain.  GENITOURINARY: No dysuria, hematuria.  ENDOCRINE: No polyuria, nocturia,  HEMATOLOGY: No anemia, easy bruising or bleeding SKIN: No rash or lesion. MUSCULOSKELETAL: severe back pain.   NEUROLOGIC: No tingling, numbness, weakness.  PSYCHIATRY: No anxiety or depression.   DRUG ALLERGIES:   Allergies  Allergen Reactions  . Ambien [Zolpidem Tartrate]     Reported by patient  . Gold-Containing Drug Products Anaphylaxis  . Other     Other reaction(s): Unknown  . Adhesive [Tape]     Tears skin  . Dilaudid [Hydromorphone Hcl]     Made tongue swell  . Haldol [Haloperidol] Other (See Comments)    Hallucinations  . Pravastatin     Arms and legs hurt     VITALS:  Blood pressure 115/53, pulse 78, temperature 98.6 F (37 C), temperature source Oral, resp. rate 18, height 5\' 2"  (1.575 m), weight 62.091 kg (136 lb 14.2 oz), SpO2 98 %.  PHYSICAL EXAMINATION:  GENERAL:  70 y.o.-year-old patient lying in the bed with no acute distress.  EYES: Pupils equal, round, reactive to light and accommodation. No scleral icterus. Extraocular muscles intact.  HEENT: Head atraumatic, normocephalic. Oropharynx and nasopharynx clear.  NECK:  Supple, no jugular venous distention. No thyroid enlargement, no tenderness.  LUNGS: Normal breath sounds  bilaterally, no wheezing, rales,rhonchi or crepitation. No use of accessory muscles of respiration.  CARDIOVASCULAR: S1, S2 normal. No murmurs, rubs, or gallops.  ABDOMEN: Soft, nontender, nondistended. Bowel sounds present. No organomegaly or mass.  EXTREMITIES: No pedal edema, cyanosis, or clubbing.  NEUROLOGIC: Cranial nerves II through XII are intact. Muscle strength 5/5 in all extremities. Sensation intact. Gait not checked.  PSYCHIATRIC: The patient is alert and oriented x 3.  SKIN: No obvious rash, lesion, or ulcer.    LABORATORY PANEL:   CBC  Recent Labs Lab 02/05/16 0343  WBC 18.3*  HGB 11.2*  HCT 34.0*  PLT 394   ------------------------------------------------------------------------------------------------------------------  Chemistries   Recent Labs Lab 02/05/16 0343  NA 137  K 3.5  CL 105  CO2 25  GLUCOSE 144*  BUN 16  CREATININE 0.48  CALCIUM 8.5*  AST 22  ALT 17  ALKPHOS 121  BILITOT 0.7   ------------------------------------------------------------------------------------------------------------------  Cardiac Enzymes No results for input(s): TROPONINI in the last 168 hours. ------------------------------------------------------------------------------------------------------------------  RADIOLOGY:  Mr Thoracic Spine Wo Contrast  02/05/2016  CLINICAL DATA:  Chronic back pain. Fall. Acute T12 compression on CT. Compression fracture at T5. EXAM: MRI THORACIC SPINE WITHOUT CONTRAST TECHNIQUE: Multiplanar, multisequence MR imaging of the thoracic spine was performed. No intravenous contrast was administered. COMPARISON:  Multiple exams, including 02/04/2016 CT scan FINDINGS: Alignment: There is 3 mm of retrolisthesis at L2- 3 seen on the bottom most images. Vertebrae: Partial interbody fusion at T1- 2 with some rudimentary disc material anteriorly. Vertebra plana at T5, slightly worse than before,  compatible with compression fracture, 3 mm posterior bony  retropulsion. Old vertebral augmentation at T6 with about 50% loss of height, but no current edema at the T6 level. T12 fracture involves the anterior, posterior, and superior margins of the vertebral body and has 5-6 mm of posterior bony retropulsion. Remote prior L1 compression fracture with vertebral augmentation in 2-3 mm posterior bony retropulsion which appears chronic. There is some chronic anterior wedging of T1. Despite efforts by the technologist and patient, motion artifact is present on today's exam and could not be eliminated. This reduces exam sensitivity and specificity. Cord: Subject to loss of sensitivity due to motion artifact, no significant abnormal cord signal is identified. Paraspinal and other soft tissues: A fluid signal intensity lesion of the left kidney is partially characterized on today's exam. This is statistically likely to be a cyst but not technically specific. Bilateral perirenal stranding. Trace pleural fluid bilaterally. There is likely dependent atelectasis in both lungs. 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 T2-3:  Grossly no impingement. T3-4: Grossly no impingement. T4-5: No impingement associated with the posterior retropulsion from T5. T5-6:  Questionable mild left foraminal stenosis due to spurring. T6-7:  No impingement identified.  Mild disc bulge. T7- 8:  Unremarkable. T8- 9:  Unremarkable. T9-10: No impingement. Mild left eccentric disc bulge and facet arthropathy. T10-11: Borderline central narrowing of the thecal sac due to disc bulge. T11-12: Low T1 and T2 signal in the left lateral recess tracking cephalad from this level potentially representing disc material or less likely epidural blood products associated with the T12 compression. This causes mild left eccentric central narrowing of the thecal sac at the T12 level there is an overall mild to moderate degree of central narrowing of the thecal sac due to the posterior bony retropulsion and possible epidural blood  products along the posterior margin of the vertebral body. No definite cord edema. T12-L1: No impingement. Minimal disc bulge and slight posterior bony retropulsion from the remote L1 compression. L1-2:  No impingement, diffuse disc bulge. L2-3:  Poorly seen due to the hardware, no definite impingement. IMPRESSION: 1. Mild-to-moderate central narrowing of the thecal sac primarily at the T12 level due to posterior retropulsion from the T12 vertebral body potentially with some epidural blood products tracking along this level. This is not causing a critical degree of stenosis currently, and we do not see any definite abnormal cord edema signal. Sensitivity mildly adversely affected by the degree of motion artifact on very sequences. This fracture has components extending transversely through the vertebral body to the anterior and posterior surfaces, as well as to the superior endplate. The 2. Essentially vertebra plana at T5 due to compression fracture with subacute component. All compression fractures at T6 and L1 with vertebral augmentations at those levels. Mild kyphotic deformity at T5. 3. Questionable mild left foraminal stenosis at T5-6 due to spurring. 4. Trace bilateral pleural effusions with dependent atelectasis in the lungs. Electronically Signed   By: Gaylyn Rong M.D.   On: 02/05/2016 14:09    EKG:   Orders placed or performed during the hospital encounter of 02/04/16  . EKG 12-Lead  . EKG 12-Lead  . ED EKG  . ED EKG    ASSESSMENT AND PLAN:  Patient is a 70 year old with thoracic compression fracture   Thoracic compression fracture. Persistent symptoms, I will add Toradol to her pain medications continue oral pain meds Plan for kyphoplasty on Thursday due to patient being on Plavix Due to patient persistently having symptoms will  need to stay in the hospital   UTI (lower urinary tract infection) with leukocytosis. Continue rocephin, urine cultures with Escherichia coli  sensitivity pending  Chronic respiratory failure and COPD. Stable, on home O2 [place] 2 L, neb prn.  Essential hypertension due to elevated blood pressure I have added IV hydralazine when necessary  Constipation I will add MiraLAX and senna to her current regimen  CODE STATUS: full code.  TOTAL TIME TAKING CARE OF THIS PATIENT: 32 minutes.  Greater than 50% time was spent on coordination of care and face-to-face counseling.  POSSIBLE D/C IN 2 DAYS, DEPENDING ON CLINICAL CONDITION.   Auburn Bilberry M.D on 02/06/2016 at 12:37 PM  Between 7am to 6pm - Pager - [phone]  After 6pm go to [email or web address] - password [email or web address] [facility]  [facility]  Office  [phone]  CC: Primary care physician; Lauro Regulus., MD

## 2016-02-06 NOTE — Progress Notes (Signed)
MRI reviewed, pain is consistent with T 12 acute compression fracture. Plan procedure 7/13, currently off Plavix.

## 2016-02-06 NOTE — Progress Notes (Addendum)
Pt's BP elevated, MD notified. New order for PRN hydralizine.

## 2016-02-06 NOTE — Progress Notes (Signed)
Per Dr. Rosita Kea patient will have to be off Plavix and cannot have surgery until Thursday. PT order has been discontinued and will need to be ordered again after surgery. Clinical Child psychotherapist (CSW) attempted to meet with patient and present bed offers however patient was confused and could not engage in conversation. CSW left patient's husband and daughter Misty Stanley a Engineer, technical sales. CSW spoke with patient's sister Roderic Scarce and presented bed offers. Per sister she would like for patient's husband and daughter to make the decision about SNF. Per sister patient went to [facility] from February to March 2017 and had a geri-psych admission to [facility]. Per sister patient usually becomes confused when she goes into the hospital. CSW will continue to follow and assist as needed.  [name], LCSW [phone]

## 2016-02-06 NOTE — Progress Notes (Signed)
Clinical Child psychotherapist (CSW) received a call back from patient's daughter Michelle Cabrera who prefers for patient to go to [facility]. CSW also received a call back from patient's husband Michelle Cabrera who stated that he does not want his wife to go to [facility] and prefers to take her home. Daughter reported that is husband's choice. CSW explained to daughter and husband that PT will work with patient after kypho and make a recommendation of home health or SNF. CSW also contacted [facility] case manager Heidi and made her aware that patient's kypho will take place Thursday and PT eval will take place after kypho. Per Heidi and new [facility] authorization will need to be started once PT eval is available. CSW will continue to follow and assist as needed.   [name], LCSW [phone]

## 2016-02-06 NOTE — Progress Notes (Signed)
[name] brought meds, Enbrel and Methotrexate. Orders obtained from MD Allena Katz). Meds taken to pharmacy for approval.

## 2016-02-07 LAB — URINE CULTURE: Culture: >=100000 — AB

## 2016-02-07 LAB — GLUCOSE, CAPILLARY: GLUCOSE-CAPILLARY: 132 mg/dL — AB (ref 65–99)

## 2016-02-07 MED ORDER — CEPHALEXIN 500 MG PO CAPS
500.0000 mg | ORAL_CAPSULE | Freq: Two times a day (BID) | ORAL | Status: DC
  Administered 2016-02-08 – 2016-02-09 (×2): 500 mg via ORAL
  Filled 2016-02-07 (×2): qty 1

## 2016-02-07 NOTE — Care Management Important Message (Signed)
Important Message  Patient Details  Name: Michelle Cabrera MRN: [id number] Date of Birth: [birth year]   Medicare Important Message Given:  Yes    [name] Borna Wessinger 02/07/2016, 2:53 PM

## 2016-02-07 NOTE — Progress Notes (Signed)
[facility] Physicians - [place] at [facility]   PATIENT NAME: Michelle Cabrera    MR#:  [id number]  DATE OF BIRTH:  [birth year]  SUBJECTIVE:   Continues to complain of  back pain. Her blood pressure was elevated due to pain REVIEW OF SYSTEMS:  CONSTITUTIONAL: No fever, fatigue or weakness.  EYES: No blurred or double vision.  EARS, NOSE, AND THROAT: No tinnitus or ear pain.  RESPIRATORY: No cough, shortness of breath, wheezing or hemoptysis.  CARDIOVASCULAR: No chest pain, orthopnea, edema.  GASTROINTESTINAL: No nausea, vomiting, diarrhea or abdominal pain.  GENITOURINARY: No dysuria, hematuria.  ENDOCRINE: No polyuria, nocturia,  HEMATOLOGY: No anemia, easy bruising or bleeding SKIN: No rash or lesion. MUSCULOSKELETAL: severe back pain.   NEUROLOGIC: No tingling, numbness, weakness.  PSYCHIATRY: No anxiety or depression.   DRUG ALLERGIES:   Allergies  Allergen Reactions  . Ambien [Zolpidem Tartrate]     Reported by patient  . Gold-Containing Drug Products Anaphylaxis  . Other     Other reaction(s): Unknown  . Adhesive [Tape]     Tears skin  . Dilaudid [Hydromorphone Hcl]     Made tongue swell  . Haldol [Haloperidol] Other (See Comments)    Hallucinations  . Pravastatin     Arms and legs hurt     VITALS:  Blood pressure 127/58, pulse 108, temperature 97.8 F (36.6 C), temperature source Oral, resp. rate 21, height 5\' 2"  (1.575 m), weight 58.968 kg (130 lb), SpO2 95 %.  PHYSICAL EXAMINATION:  GENERAL:  70 y.o.-year-old patient lying in the bed with no acute distress.  EYES: Pupils equal, round, reactive to light and accommodation. No scleral icterus. Extraocular muscles intact.  HEENT: Head atraumatic, normocephalic. Oropharynx and nasopharynx clear.  NECK:  Supple, no jugular venous distention. No thyroid enlargement, no tenderness.  LUNGS: Normal breath sounds bilaterally, no wheezing, rales,rhonchi or crepitation. No use of accessory muscles of  respiration.  CARDIOVASCULAR: S1, S2 normal. No murmurs, rubs, or gallops.  ABDOMEN: Soft, nontender, nondistended. Bowel sounds present. No organomegaly or mass.  EXTREMITIES: No pedal edema, cyanosis, or clubbing.  NEUROLOGIC: Cranial nerves II through XII are intact. Muscle strength 5/5 in all extremities. Sensation intact. Gait not checked.  PSYCHIATRIC: The patient is alert and oriented x 3.  SKIN: No obvious rash, lesion, or ulcer.    LABORATORY PANEL:   CBC  Recent Labs Lab 02/05/16 0343  WBC 18.3*  HGB 11.2*  HCT 34.0*  PLT 394   ------------------------------------------------------------------------------------------------------------------  Chemistries   Recent Labs Lab 02/05/16 0343  NA 137  K 3.5  CL 105  CO2 25  GLUCOSE 144*  BUN 16  CREATININE 0.48  CALCIUM 8.5*  AST 22  ALT 17  ALKPHOS 121  BILITOT 0.7   ------------------------------------------------------------------------------------------------------------------  Cardiac Enzymes No results for input(s): TROPONINI in the last 168 hours. ------------------------------------------------------------------------------------------------------------------  RADIOLOGY:  Mr Thoracic Spine Wo Contrast  02/05/2016  CLINICAL DATA:  Chronic back pain. Fall. Acute T12 compression on CT. Compression fracture at T5. EXAM: MRI THORACIC SPINE WITHOUT CONTRAST TECHNIQUE: Multiplanar, multisequence MR imaging of the thoracic spine was performed. No intravenous contrast was administered. COMPARISON:  Multiple exams, including 02/04/2016 CT scan FINDINGS: Alignment: There is 3 mm of retrolisthesis at L2- 3 seen on the bottom most images. Vertebrae: Partial interbody fusion at T1- 2 with some rudimentary disc material anteriorly. Vertebra plana at T5, slightly worse than before, compatible with compression fracture, 3 mm posterior bony retropulsion. Old vertebral augmentation at T6 with about  50% loss of height, but no  current edema at the T6 level. T12 fracture involves the anterior, posterior, and superior margins of the vertebral body and has 5-6 mm of posterior bony retropulsion. Remote prior L1 compression fracture with vertebral augmentation in 2-3 mm posterior bony retropulsion which appears chronic. There is some chronic anterior wedging of T1. Despite efforts by the technologist and patient, motion artifact is present on today's exam and could not be eliminated. This reduces exam sensitivity and specificity. Cord: Subject to loss of sensitivity due to motion artifact, no significant abnormal cord signal is identified. Paraspinal and other soft tissues: A fluid signal intensity lesion of the left kidney is partially characterized on today's exam. This is statistically likely to be a cyst but not technically specific. Bilateral perirenal stranding. Trace pleural fluid bilaterally. There is likely dependent atelectasis in both lungs. 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 T2-3:  Grossly no impingement. T3-4: Grossly no impingement. T4-5: No impingement associated with the posterior retropulsion from T5. T5-6:  Questionable mild left foraminal stenosis due to spurring. T6-7:  No impingement identified.  Mild disc bulge. T7- 8:  Unremarkable. T8- 9:  Unremarkable. T9-10: No impingement. Mild left eccentric disc bulge and facet arthropathy. T10-11: Borderline central narrowing of the thecal sac due to disc bulge. T11-12: Low T1 and T2 signal in the left lateral recess tracking cephalad from this level potentially representing disc material or less likely epidural blood products associated with the T12 compression. This causes mild left eccentric central narrowing of the thecal sac at the T12 level there is an overall mild to moderate degree of central narrowing of the thecal sac due to the posterior bony retropulsion and possible epidural blood products along the posterior margin of the vertebral body. No definite cord edema.  T12-L1: No impingement. Minimal disc bulge and slight posterior bony retropulsion from the remote L1 compression. L1-2:  No impingement, diffuse disc bulge. L2-3:  Poorly seen due to the hardware, no definite impingement. IMPRESSION: 1. Mild-to-moderate central narrowing of the thecal sac primarily at the T12 level due to posterior retropulsion from the T12 vertebral body potentially with some epidural blood products tracking along this level. This is not causing a critical degree of stenosis currently, and we do not see any definite abnormal cord edema signal. Sensitivity mildly adversely affected by the degree of motion artifact on very sequences. This fracture has components extending transversely through the vertebral body to the anterior and posterior surfaces, as well as to the superior endplate. The 2. Essentially vertebra plana at T5 due to compression fracture with subacute component. All compression fractures at T6 and L1 with vertebral augmentations at those levels. Mild kyphotic deformity at T5. 3. Questionable mild left foraminal stenosis at T5-6 due to spurring. 4. Trace bilateral pleural effusions with dependent atelectasis in the lungs. Electronically Signed   By: Gaylyn Rong M.D.   On: 02/05/2016 14:09    ASSESSMENT AND PLAN:  Patient is a 70 year old with thoracic compression fracture  *Thoracic compression fracture. -Persistent symptoms, on Toradol to her pain medications continue oral pain meds -Plan for kyphoplasty on Thursday due to patient being on Plavix  *ecoli UTI (lower urinary tract infection) with leukocytosis. Continue rocephin, urine cultures with Escherichia coli sensitivity pending -changed to oral keflex  *Chronic respiratory failure and COPD. Stable, on home O2 [place] 2 L, neb prn.  *Essential hypertension due to elevated blood pressure  - added IV hydralazine when necessary  *Constipatio --on MiraLAX and senna  CODE STATUS: full code.  TOTAL TIME  TAKING CARE OF THIS PATIENT: 30 minutes.  Greater than 50% time was spent on coordination of care and face-to-face counseling.  POSSIBLE D/C IN 1-2 DAYS, DEPENDING ON CLINICAL CONDITION.   Michelle Cabrera M.D on 02/07/2016 at 12:50 PM  Between 7am to 6pm - Pager - [phone]  After 6pm go to [email or web address] - password [email or web address] [facility]  [place]  [facility]ospital[facility]  Office  [phone]  CC: Primary care physician; Lauro Regulus., MD

## 2016-02-07 NOTE — Progress Notes (Signed)
Clinical Child psychotherapist (CSW) contacted patient's husband Deniece Portela to discuss SNF placement. Husband chose [facility] however he prefers to take her home if possible. CSW will continue to follow and assist as needed.   [name], LCSW [phone]

## 2016-02-07 NOTE — Plan of Care (Signed)
Problem: Safety: Goal: Ability to remain free from injury will improve Outcome: Progressing Free from falls this shift. Bed alarm is armed.  Problem: Pain Managment: Goal: General experience of comfort will improve Outcome: Progressing Pain control with oral medications.

## 2016-02-07 NOTE — Progress Notes (Signed)
Patient continues to have severe pain. Hope she mobilizes well post op, disposition to be determined post op with PT eval.

## 2016-02-08 ENCOUNTER — Inpatient Hospital Stay: Payer: Medicare Other | Admitting: *Deleted

## 2016-02-08 ENCOUNTER — Encounter
Admission: EM | Disposition: A | Discharge status: Skilled Nursing Facility | Payer: Self-pay | Source: Home / Self Care | Attending: Internal Medicine

## 2016-02-08 ENCOUNTER — Encounter: Payer: Self-pay | Admitting: Anesthesiology

## 2016-02-08 ENCOUNTER — Inpatient Hospital Stay: Payer: Medicare Other

## 2016-02-08 HISTORY — PX: KYPHOPLASTY: SHX5884

## 2016-02-08 LAB — CBC
HCT: 33.3 % — ABNORMAL LOW (ref 35.0–47.0)
Hemoglobin: 11.1 g/dL — ABNORMAL LOW (ref 12.0–16.0)
MCH: 30.5 pg (ref 26.0–34.0)
MCHC: 33.2 g/dL (ref 32.0–36.0)
MCV: 91.8 fL (ref 80.0–100.0)
PLATELETS: 357 10*3/uL (ref 150–440)
RBC: 3.63 MIL/uL — AB (ref 3.80–5.20)
RDW: 18.4 % — ABNORMAL HIGH (ref 11.5–14.5)
WBC: 12.7 10*3/uL — ABNORMAL HIGH (ref 3.6–11.0)

## 2016-02-08 SURGERY — KYPHOPLASTY
Anesthesia: General | Wound class: Clean

## 2016-02-08 MED ORDER — METOPROLOL TARTRATE 25 MG PO TABS
25.0000 mg | ORAL_TABLET | Freq: Two times a day (BID) | ORAL | Status: DC
  Administered 2016-02-08 – 2016-02-09 (×3): 25 mg via ORAL
  Filled 2016-02-08 (×3): qty 1

## 2016-02-08 MED ORDER — FENTANYL CITRATE (PF) 100 MCG/2ML IJ SOLN
25.0000 ug | INTRAMUSCULAR | Status: DC | PRN
  Administered 2016-02-08: 25 ug via INTRAVENOUS

## 2016-02-08 MED ORDER — CLOPIDOGREL BISULFATE 75 MG PO TABS
75.0000 mg | ORAL_TABLET | Freq: Every day | ORAL | Status: DC
  Administered 2016-02-09: 75 mg via ORAL
  Filled 2016-02-08: qty 1

## 2016-02-08 MED ORDER — ENOXAPARIN SODIUM 40 MG/0.4ML ~~LOC~~ SOLN
40.0000 mg | SUBCUTANEOUS | Status: DC
  Administered 2016-02-09: 40 mg via SUBCUTANEOUS
  Filled 2016-02-08: qty 0.4

## 2016-02-08 MED ORDER — MIDAZOLAM HCL 2 MG/2ML IJ SOLN
INTRAMUSCULAR | Status: DC | PRN
  Administered 2016-02-08: 1 mg via INTRAVENOUS

## 2016-02-08 MED ORDER — SODIUM CHLORIDE 0.9 % IV SOLN
INTRAVENOUS | Status: DC
  Administered 2016-02-08 – 2016-02-09 (×4): via INTRAVENOUS

## 2016-02-08 MED ORDER — FENTANYL CITRATE (PF) 100 MCG/2ML IJ SOLN
INTRAMUSCULAR | Status: AC
  Administered 2016-02-08: 25 ug via INTRAVENOUS
  Filled 2016-02-08: qty 2

## 2016-02-08 MED ORDER — LACTATED RINGERS IV SOLN: INTRAVENOUS | Status: DC

## 2016-02-08 MED ORDER — FENTANYL CITRATE (PF) 100 MCG/2ML IJ SOLN
INTRAMUSCULAR | Status: DC | PRN
  Administered 2016-02-08 (×2): 25 ug via INTRAVENOUS

## 2016-02-08 MED ORDER — LIDOCAINE HCL 1 % IJ SOLN
INTRAMUSCULAR | Status: DC | PRN
  Administered 2016-02-08: 10 mL
  Administered 2016-02-08: 20 mL

## 2016-02-08 MED ORDER — SODIUM CHLORIDE 0.9 % IV SOLN
INTRAVENOUS | Status: DC
  Administered 2016-02-08: 12:00:00 via INTRAVENOUS

## 2016-02-08 MED ORDER — BUPIVACAINE-EPINEPHRINE (PF) 0.5% -1:200000 IJ SOLN
INTRAMUSCULAR | Status: DC | PRN
  Administered 2016-02-08: 20 mL via PERINEURAL

## 2016-02-08 MED ORDER — METHOTREXATE SODIUM CHEMO INJECTION 50 MG/2ML
20.0000 mg | Freq: Once | INTRAMUSCULAR | Status: DC
  Filled 2016-02-08: qty 0.8

## 2016-02-08 MED ORDER — ONDANSETRON HCL 4 MG/2ML IJ SOLN: 4.0000 mg | Freq: Once | INTRAMUSCULAR | Status: DC | PRN

## 2016-02-08 MED ORDER — LACTATED RINGERS IV SOLN
INTRAVENOUS | Status: DC | PRN
  Administered 2016-02-08: 08:00:00 via INTRAVENOUS

## 2016-02-08 MED ORDER — OXYCODONE HCL 5 MG PO TABS: 5.0000 mg | ORAL_TABLET | ORAL | Status: DC | PRN

## 2016-02-08 MED ORDER — KETAMINE HCL 10 MG/ML IJ SOLN
INTRAMUSCULAR | Status: DC | PRN
  Administered 2016-02-08 (×4): 5 mg via INTRAVENOUS

## 2016-02-08 SURGICAL SUPPLY — 15 items
CEMENT KYPHON CX01A KIT/MIXER (Cement) ×3 IMPLANT
DECANTER SPIKE VIAL GLASS SM (MISCELLANEOUS) ×3 IMPLANT
DEVICE BIOPSY BONE KYPHX (INSTRUMENTS) ×3 IMPLANT
DRAPE C-ARM XRAY 36X54 (DRAPES) ×3 IMPLANT
DURAPREP 26ML APPLICATOR (WOUND CARE) ×3 IMPLANT
GLOVE SURG ORTHO 9.0 STRL STRW (GLOVE) ×3 IMPLANT
GOWN SRG 2XL LVL 4 RGLN SLV (GOWNS) ×1 IMPLANT
GOWN STRL NON-REIN 2XL LVL4 (GOWNS) ×2
GOWN STRL REUS W/ TWL LRG LVL3 (GOWN DISPOSABLE) ×1 IMPLANT
GOWN STRL REUS W/TWL LRG LVL3 (GOWN DISPOSABLE) ×2
LIQUID BAND (GAUZE/BANDAGES/DRESSINGS) ×3 IMPLANT
PACK KYPHOPLASTY (MISCELLANEOUS) ×3 IMPLANT
STRAP SAFETY BODY (MISCELLANEOUS) ×3 IMPLANT
TRAY KYPHOPAK 15/3 EXPRESS 1ST (MISCELLANEOUS) ×3 IMPLANT
TRAY KYPHOPAK 20/3 EXPRESS 1ST (MISCELLANEOUS) ×3 IMPLANT

## 2016-02-08 NOTE — Plan of Care (Signed)
Pharm called to inform of patient needing subQ injection of methyltrexate.  Kenard Gower, RN from 1C will be administering after shift change (7PM) since she is chemo certified.  Info will be passed in report to oncoming RN.

## 2016-02-08 NOTE — Evaluation (Signed)
Physical Therapy Evaluation Patient Details Name: Michelle Cabrera MRN: [id number] DOB: 25-Jun-1946 Today's Date: 02/08/2016   History of Present Illness  Pt is a 70 y.o. female s/p mechanical fall (pt was dizzy/lightheaded) and c/o severe back pain.  Imaging showing comminuted T12 vertebral body fx (complex compression or mid burst type fx with retropulsion).  Imaging also showing stable severe T5 compression fx, chronic T6 and L2 compression fx, and chronic posterior rib fx's.  Pt admitted for UTI and pain control.  Pt s/p T12 kyphoplasty 02/08/16.  PMH includes htn, COPD, anxiety, RA, R wrist fusion.  Of note, pt with hospital admission about 3 months ago for syncopal episode/fall on steps.    Clinical Impression  Attempted PT eval prior to noon today but pt sleeping soundly and when able to briefly wake pt up, pt was reaching into the air repeatedly (nothing close to actually touch) and then pt falling back asleep so deferred PT eval until later in afternoon.  Pt lethargic upon PT entering room in PM (pt waking up briefly and then falling asleep) but with consistent stimulation (via vc's and some gentle tactile cues) therapist was able to keep pt awake to participate in session activities.  Prior to admission, pt was independent with functional mobility within the home and used rollator in the community.  Pt lives with her husband who provides assist.  Currently pt is min to mod assist x2 supine to sit and sit to stand with RW (posterior lean noted mildly in sitting and significantly in standing initially requiring assist and cueing to correct) and min assist x2 to ambulate 12 feet with RW (limited distance d/t fatigue).  Pt requiring intermittent cueing to keep her eyes open during session.  HR 117-132 bpm during session (nursing notified).  Pt would benefit from skilled PT to address noted impairments and functional limitations.  Recommend pt discharge to STR when medically appropriate.     Follow  Up Recommendations SNF    Equipment Recommendations  Rolling walker with 5" wheels    Recommendations for Other Services       Precautions / Restrictions Precautions Precautions: Fall;Back Precaution Comments: spinal precautions s/p kyphoplasty; per verbal from MD Rosita Kea 02/08/16, pt does not need to wear brace (pt has TLSO in room) unless she wants to (nursing reports pt gets agitated when trying to put brace on) Restrictions Weight Bearing Restrictions: No      Mobility  Bed Mobility Overal bed mobility: Needs Assistance Bed Mobility: Supine to Sit     Supine to sit: Min assist;Mod assist;+2 for physical assistance     General bed mobility comments: vc's required for logrolling technique; use of side rail; vc's required for use of UE's and LE's to assist with sitting up and logrolling  Transfers Overall transfer level: Needs assistance Equipment used: Rolling walker (2 wheeled) Transfers: Sit to/from Stand Sit to Stand: Min assist;Mod assist;+2 physical assistance         General transfer comment: hand over hand cueing required to place pt's B UE's on RW; vc's required for safe transfer technique (hand/foot placement); significant posterior lean noted upon standing requiring vc's and assist to shift weight forward  Ambulation/Gait Ambulation/Gait assistance: Min assist;+2 physical assistance Ambulation Distance (Feet): 12 Feet Assistive device: Rolling walker (2 wheeled)   Gait velocity: decreased   General Gait Details: decreased B step length/foot clearance/heelstrike; mildly unsteady; vc's required intermittently to open eyes and for safe use of RW and for safe gait technique  Stairs  Wheelchair Mobility    Modified Rankin (Stroke Patients Only)       Balance Overall balance assessment: Needs assistance Sitting-balance support: Bilateral upper extremity supported;Feet supported Sitting balance-Leahy Scale: Poor Sitting balance - Comments:  min assist and vc's required to correct posterior lean Postural control: Posterior lean Standing balance support: Bilateral upper extremity supported (on RW) Standing balance-Leahy Scale: Poor Standing balance comment: pt with significant posterior lean upon standing requiring min to mod assist to correct initially with vc's                             Pertinent Vitals/Pain Pain Assessment: 0-10 Pain Score: 2  Pain Location: mid/low back Pain Descriptors / Indicators: Sore Pain Intervention(s): Limited activity within patient's tolerance;Monitored during session;Premedicated before session;Repositioned  O2 >95% on 4 L/min O2 via nasal cannula during session.    Home Living Family/patient expects to be discharged to:: Private residence Living Arrangements: Spouse/significant other Available Help at Discharge: Family;Available 24 hours/day (Husband) Type of Home: House Home Access: Ramped entrance     Home Layout: One level Home Equipment: Shower seat;Cane - single point;Bedside commode;Walker - 4 wheels      Prior Function Level of Independence: Independent with assistive device(s)         Comments: [name] reports that pt does not use any AD in home but uses rollator outside/in community.  Pt uses home O2 (2 L) at night and PRN during the day.  Pt has lift chair in home.     Hand Dominance        Extremity/Trunk Assessment   Upper Extremity Assessment: Generalized weakness;Difficult to assess due to impaired cognition (pt with difficulty following directions to assess)           Lower Extremity Assessment: Generalized weakness;Difficult to assess due to impaired cognition (pt with difficulty following directions to assess)         Communication   Communication: No difficulties  Cognition Arousal/Alertness: Lethargic (Pt in/out of sleep upon entering room and once pt was mobilitizing, pt requiring vc's to keep eyes open intermittently) Behavior  During Therapy: Flat affect Overall Cognitive Status: Impaired/Different from baseline Area of Impairment: Orientation;Following commands Orientation Level: Disoriented to;Place;Time;Situation Current Attention Level: Selective Memory: Decreased recall of precautions;Decreased short-term memory Following Commands: Follows one step commands inconsistently            General Comments General comments (skin integrity, edema, etc.): bandage noted on pt's mid back (post-surgical)  Nursing cleared pt for participation in physical therapy.  Pt agreeable to PT session when woken.  [name] present for entire session.    Exercises  Pt requiring increased time and cueing to participate safely in bed mobility, transfers, and ambulation activities (see above for details).      Assessment/Plan    PT Assessment Patient needs continued PT services  PT Diagnosis Difficulty walking;Generalized weakness;Acute pain   PT Problem List Decreased strength;Decreased activity tolerance;Decreased balance;Decreased mobility;Decreased cognition;Decreased knowledge of use of DME;Decreased safety awareness;Decreased knowledge of precautions;Pain  PT Treatment Interventions DME instruction;Gait training;Functional mobility training;Therapeutic activities;Therapeutic exercise;Balance training;Patient/family education   PT Goals (Current goals can be found in the Care Plan section) Acute Rehab PT Goals Patient Stated Goal: to bring pt home PT Goal Formulation: With family (pt's husband) Time For Goal Achievement: 02/22/16 Potential to Achieve Goals: Fair    Frequency BID   Barriers to discharge Decreased caregiver support      Co-evaluation  End of Session Equipment Utilized During Treatment: Gait belt;Oxygen (4 L/min O2 via nasal cannula) Activity Tolerance: Patient limited by fatigue Patient left: in chair;with call bell/phone within reach;with chair alarm set;with family/visitor  present Nurse Communication: Mobility status;Precautions (HR elevation during session)         Time: 1400-1435 PT Time Calculation (min) (ACUTE ONLY): 35 min   Charges:   PT Evaluation $PT Eval Low Complexity: 1 Procedure PT Treatments $Therapeutic Activity: 8-22 mins   PT G CodesHendricks Limes 02-27-2016, 3:05 PM Hendricks Limes, PT [phone]

## 2016-02-08 NOTE — Progress Notes (Signed)
[facility] - [place] at [facility]   PATIENT NAME: Michelle Cabrera    MR#:  [id number]  DATE OF BIRTH:  1945/09/30  SUBJECTIVE:s/p kyphoplasty today,tachycardia,and clinically dry mucosa.    REVIEW OF SYSTEMS:  CONSTITUTIONAL: No fever, fatigue or weakness.  EYES: No blurred or double vision.  EARS, NOSE, AND THROAT: No tinnitus or ear pain.  RESPIRATORY: No cough, shortness of breath, wheezing or hemoptysis.  CARDIOVASCULAR: No chest pain, orthopnea, edema.  GASTROINTESTINAL: No nausea, vomiting, diarrhea or abdominal pain.  GENITOURINARY: No dysuria, hematuria.  ENDOCRINE: No polyuria, nocturia,  HEMATOLOGY: No anemia, easy bruising or bleeding SKIN: No rash or lesion. MUSCULOSKELETAL: severe back pain.   NEUROLOGIC: No tingling, numbness, weakness.  PSYCHIATRY: No anxiety or depression.   DRUG ALLERGIES:   Allergies  Allergen Reactions  . Ambien [Zolpidem Tartrate]     Reported by patient  . Gold-Containing Drug Products Anaphylaxis  . Other     Other reaction(s): Unknown  . Adhesive [Tape]     Tears skin  . Dilaudid [Hydromorphone Hcl]     Made tongue swell  . Haldol [Haloperidol] Other (See Comments)    Hallucinations  . Pravastatin     Arms and legs hurt     VITALS:  Blood pressure 162/75, pulse 118, temperature 97.1 F (36.2 C), temperature source Axillary, resp. rate 19, height 5\' 2"  (1.575 m), weight 59.091 kg (130 lb 4.4 oz), SpO2 99 %.  PHYSICAL EXAMINATION:  GENERAL:  70 y.o.-year-old patient lying in the bed with no acute distress.  EYES: Pupils equal, round, reactive to light and accommodation. No scleral icterus. Extraocular muscles intact.  HEENT: Head atraumatic, normocephalic. Oropharynx and nasopharynx clear.  NECK:  Supple, no jugular venous distention. No thyroid enlargement, no tenderness.  LUNGS: Normal breath sounds bilaterally, no wheezing, rales,rhonchi or crepitation. No use of accessory muscles of respiration.   CARDIOVASCULAR: S1, S2 normal. No murmurs, rubs, or gallops.  ABDOMEN: Soft, nontender, nondistended. Bowel sounds present. No organomegaly or mass.  EXTREMITIES: No pedal edema, cyanosis, or clubbing.  NEUROLOGIC: Cranial nerves II through XII are intact. Muscle strength 5/5 in all extremities. Sensation intact. Gait not checked.  PSYCHIATRIC: The patient is alert and oriented x 3.,slightly confused, baseline as per her nurse,seeign  Pt for first time today  SKIN: No obvious rash, lesion, or ulcer.    LABORATORY PANEL:   CBC  Recent Labs Lab 02/08/16 0500  WBC 12.7*  HGB 11.1*  HCT 33.3*  PLT 357   ------------------------------------------------------------------------------------------------------------------  Chemistries   Recent Labs Lab 02/05/16 0343  NA 137  K 3.5  CL 105  CO2 25  GLUCOSE 144*  BUN 16  CREATININE 0.48  CALCIUM 8.5*  AST 22  ALT 17  ALKPHOS 121  BILITOT 0.7   ------------------------------------------------------------------------------------------------------------------  Cardiac Enzymes No results for input(s): TROPONINI in the last 168 hours. ------------------------------------------------------------------------------------------------------------------  RADIOLOGY:  Dg Thoracic Spine 2 View  02/08/2016  CLINICAL DATA:  Operative kyphoplasty. EXAM: THORACIC SPINE 2 VIEWS ; DG C-ARM 61-120 MIN COMPARISON:  02/04/2016 plain film and CT FINDINGS: Remote posterior fusion including L2. Remote kyphoplasty at L1. Interval kyphoplasty performed at T12. IMPRESSION: Interval kyphoplasty at T12. Electronically Signed   By: 04/06/2016 M.D.   On: 02/08/2016 09:52   Dg C-arm 61-120 Min  02/08/2016  CLINICAL DATA:  Operative kyphoplasty. EXAM: THORACIC SPINE 2 VIEWS ; DG C-ARM 61-120 MIN COMPARISON:  02/04/2016 plain film and CT FINDINGS: Remote posterior fusion including L2. Remote kyphoplasty  at L1. Interval kyphoplasty performed at T12.  IMPRESSION: Interval kyphoplasty at T12. Electronically Signed   By: Norva Pavlov M.D.   On: 02/08/2016 09:52    ASSESSMENT AND PLAN:  Patient is a 70 year old with thoracic compression fracture  *Thoracic compression fracture. S/p  kyphoplasty Continue pain meds,PT  *ecoli UTI (lower urinary tract infection) with leukocytosis. , urine cultures with Escherichia coli  S to all abx.-changed to oral keflex  *Chronic respiratory failure and COPD. Stable, on home O2 [place] 2 L, neb prn.  *Essential hypertension due to elevated blood pressure  - added IV hydralazine when necessary Tachycardia due to pain and dehydration;continue NS 150 cc for 2 hrs and resume 75 CC/hr.addmetoprolol 25 mg po bid as needed  *Constipation --on MiraLAX and senna   CODE STATUS: full code.  TOTAL TIME TAKING CARE OF THIS PATIENT: 30 minutes.  Greater than 50% time was spent on coordination of care and face-to-face counseling.  POSSIBLE D/C IN 1-2 DAYS, DEPENDING ON CLINICAL CONDITION.   Katha Hamming M.D on 02/08/2016 at 11:34 AM  Between 7am to 6pm - Pager - [phone]  After 6pm go to [email or web address] - password [email or web address] [facility]  [facility]  Office  [phone]  CC: Primary care physician; Lauro Regulus., MD

## 2016-02-08 NOTE — OR Nursing (Signed)
0740 IV dressing removed right arm by Riley Nearing CRNA, skin tear occurred. Dressed area with vaseline gauze, telfa & kerlix.

## 2016-02-08 NOTE — Op Note (Signed)
02/04/2016 - 02/08/2016  8:40 AM  PATIENT:  Michelle Cabrera  70 y.o. female  PRE-OPERATIVE DIAGNOSIS:  T12 compression fracture  POST-OPERATIVE DIAGNOSIS:  T12 compression fracture  PROCEDURE:  Procedure(s): KYPHOPLASTY  T-12 (N/A)  SURGEON: Leitha Schuller, MD  ASSISTANTS: none  ANESTHESIA:   local and MAC  EBL:     BLOOD ADMINISTERED:none  DRAINS: none   LOCAL MEDICATIONS USED:  MARCAINE    and XYLOCAINE   SPECIMEN:  No Specimen  DISPOSITION OF SPECIMEN:  N/A  COUNTS:  YES  TOURNIQUET:  * No tourniquets in log *  IMPLANTS: bone cement  DICTATION: .Dragon Dictation patient was brought to the operating room and after adequate sedation was given, patient placed prone. C-arm was brought in and good visualization of the T12 vertebral body obtained in both AP and lateral projections. After patient identification and timeout procedure were completed, 10 cc of Xylocaine was infiltrated subcutaneously on either side of T12. The back was then prepped and draped and repeat timeout procedure carried out. Spinal needle was then used to put local down to the pedicle on the right side with a 50-50 mix of 1% Xylocaine, half percent Sensorcaine with epinephrine. After allowing this to set a small incision was made and trocar advanced into the vertebral body care being taken to stay outside the spinal canal and neural foramen. A specimen was attempted to be obtained but no specimen came out of the trocar and drilling was then carried out. Balloon inserted and inflated to 4 cc. After getting partial correction of the deformity and when the cement was the appropriate consistency the balloon was let down and proximally 5-1/2 cc of bone cement infiltrated crossing the midline and getting very good fill of the vertebral body. After the cement had set the trochars removed and permanent C-arm views obtained. Skin closed with Dermabond and covered with a Band-Aid  PLAN OF CARE: Continue as  inpatient  PATIENT DISPOSITION:  PACU - hemodynamically stable.

## 2016-02-08 NOTE — Progress Notes (Signed)
PT is recommending SNF. Clinical Social Worker (CSW) met with patient and her husband [name] at bedside to discuss D/C plan. Patient and husband are agreeable for patient to go to [facility]. CSW started [employer] authorization. CSW will continue to follow and assist as needed.   [employer], LCSW [phone]

## 2016-02-08 NOTE — Anesthesia Preprocedure Evaluation (Signed)
Anesthesia Evaluation  Patient identified by MRN, date of birth, ID band Patient awake    Reviewed: Allergy & Precautions, H&P , NPO status , Patient's Chart, lab work & pertinent test results, reviewed documented beta blocker date and time   History of Anesthesia Complications Negative for: history of anesthetic complications  Airway Mallampati: III  TM Distance: >3 FB Neck ROM: full    Dental no notable dental hx. (+) Caps, Poor Dentition   Pulmonary shortness of breath and with exertion, neg sleep apnea, COPD,  COPD inhaler and oxygen dependent, neg recent URI, former smoker,    Pulmonary exam normal breath sounds clear to auscultation       Cardiovascular Exercise Tolerance: Good hypertension, On Medications (-) angina(-) CAD, (-) Past MI, (-) Cardiac Stents and (-) CABG Normal cardiovascular exam(-) dysrhythmias (-) Valvular Problems/Murmurs Rhythm:regular Rate:Normal     Neuro/Psych PSYCHIATRIC DISORDERS (Depression and anxiety) negative neurological ROS     GI/Hepatic Neg liver ROS, GERD  ,  Endo/Other  negative endocrine ROS  Renal/GU negative Renal ROS  negative genitourinary   Musculoskeletal   Abdominal   Peds  Hematology negative hematology ROS (+)   Anesthesia Other Findings Past Medical History:   Hypertension                                                 Closed compression fracture of L1 lumbar verte* 11/2012       COPD (chronic obstructive pulmonary disease) (*              Anxiety                                                      Depression                                                   GERD (gastroesophageal reflux disease)                       Rheumatoid arthritis (HCC)                                   DDD (degenerative disc disease)                              Chronic lower back pain                                      Collagen vascular disease (HCC)                              Reproductive/Obstetrics negative OB ROS                             Anesthesia Physical  Anesthesia Plan  ASA: III  Anesthesia Plan: General   Post-op Pain Management:    Induction:   Airway Management Planned:   Additional Equipment:   Intra-op Plan:   Post-operative Plan:   Informed Consent: I have reviewed the patients History and Physical, chart, labs and discussed the procedure including the risks, benefits and alternatives for the proposed anesthesia with the patient or authorized representative who has indicated his/her understanding and acceptance.   Dental Advisory Given  Plan Discussed with: Anesthesiologist, CRNA and Surgeon  Anesthesia Plan Comments:         Anesthesia Quick Evaluation

## 2016-02-08 NOTE — Anesthesia Postprocedure Evaluation (Signed)
Anesthesia Post Note  Patient: Michelle Cabrera  Procedure(s) Performed: Procedure(s) (LRB): KYPHOPLASTY  T-12 (N/A)  Patient location during evaluation: PACU Anesthesia Type: General Level of consciousness: awake and alert Pain management: pain level controlled Vital Signs Assessment: post-procedure vital signs reviewed and stable Respiratory status: spontaneous breathing, nonlabored ventilation, respiratory function stable and patient connected to nasal cannula oxygen Cardiovascular status: blood pressure returned to baseline and stable Postop Assessment: no signs of nausea or vomiting Anesthetic complications: no    Last Vitals:  Filed Vitals:   02/08/16 1234 02/08/16 1335  BP: 144/66 161/93  Pulse: 121 113  Temp: 37.2 C 36.8 C  Resp: 18 18    Last Pain:  Filed Vitals:   02/08/16 1335  PainSc: Asleep                 Lenard Simmer

## 2016-02-08 NOTE — Transfer of Care (Signed)
Immediate Anesthesia Transfer of Care Note  Patient: Michelle Cabrera  Procedure(s) Performed: Procedure(s): KYPHOPLASTY  T-12 (N/A)  Patient Location: PACU  Anesthesia Type:MAC  Level of Consciousness: awake, alert  and oriented  Airway & Oxygen Therapy: Patient Spontanous Breathing and Patient connected to nasal cannula oxygen  Post-op Assessment: Report given to RN and Post -op Vital signs reviewed and stable  Post vital signs: Reviewed and stable  Last Vitals:  Filed Vitals:   02/08/16 0416 02/08/16 0845  BP: 138/75 104/92  Pulse: 118 148  Temp: 37.2 C 36.7 C  Resp: 16 22    Last Pain:  Filed Vitals:   02/08/16 0846  PainSc: Asleep         Complications: No apparent anesthesia complications

## 2016-02-09 ENCOUNTER — Encounter
Admission: RE | Admit: 2016-02-09 | Discharge: 2016-02-09 | Disposition: A | Discharge status: Home / Self Care | Payer: Medicare Other | Source: Ambulatory Visit | Attending: Internal Medicine | Admitting: Internal Medicine

## 2016-02-09 DIAGNOSIS — R410 Disorientation, unspecified: Secondary | ICD-10-CM | POA: Insufficient documentation

## 2016-02-09 LAB — GLUCOSE, CAPILLARY: GLUCOSE-CAPILLARY: 96 mg/dL (ref 65–99)

## 2016-02-09 LAB — BASIC METABOLIC PANEL
Anion gap: 8 (ref 5–15)
BUN: 14 mg/dL (ref 6–20)
CHLORIDE: 108 mmol/L (ref 101–111)
CO2: 24 mmol/L (ref 22–32)
CREATININE: 0.45 mg/dL (ref 0.44–1.00)
Calcium: 8.4 mg/dL — ABNORMAL LOW (ref 8.9–10.3)
Glucose, Bld: 93 mg/dL (ref 65–99)
POTASSIUM: 3.5 mmol/L (ref 3.5–5.1)
SODIUM: 140 mmol/L (ref 135–145)

## 2016-02-09 LAB — CBC
HEMATOCRIT: 30.3 % — AB (ref 35.0–47.0)
Hemoglobin: 10.2 g/dL — ABNORMAL LOW (ref 12.0–16.0)
MCH: 30.6 pg (ref 26.0–34.0)
MCHC: 33.7 g/dL (ref 32.0–36.0)
MCV: 90.7 fL (ref 80.0–100.0)
PLATELETS: 323 10*3/uL (ref 150–440)
RBC: 3.35 MIL/uL — AB (ref 3.80–5.20)
RDW: 18.1 % — AB (ref 11.5–14.5)
WBC: 11.1 10*3/uL — AB (ref 3.6–11.0)

## 2016-02-09 MED ORDER — POLYETHYLENE GLYCOL 3350 17 G PO PACK: 17.0000 g | PACK | Freq: Every day | ORAL | Status: DC

## 2016-02-09 MED ORDER — SENNA 8.6 MG PO TABS: 1.0000 | ORAL_TABLET | Freq: Every day | ORAL | Status: DC

## 2016-02-09 MED ORDER — OXYCODONE-ACETAMINOPHEN 10-325 MG PO TABS: 1.0000 | ORAL_TABLET | Freq: Four times a day (QID) | ORAL | Status: DC | PRN

## 2016-02-09 MED ORDER — TAPENTADOL HCL 50 MG PO TABS: 50.0000 mg | ORAL_TABLET | ORAL | Status: DC | PRN

## 2016-02-09 MED ORDER — CEPHALEXIN 500 MG PO CAPS: 500.0000 mg | ORAL_CAPSULE | Freq: Two times a day (BID) | ORAL | Status: DC

## 2016-02-09 NOTE — Care Management Note (Signed)
Case Management Note  Patient Details  Name: Michelle Cabrera MRN: [id number] Date of Birth: [birth year]  Subjective/Objective:                    Action/Plan: DC to SNF today will sign off.  Expected Discharge Date:                  Expected Discharge Plan:  Skilled Nursing Facility  In-House Referral:  Clinical Social Work  Discharge planning Services  CM Consult  Post Acute Care Choice:    Choice offered to:     DME Arranged:    DME Agency:     HH Arranged:    HH Agency:     Status of Service:  Completed, signed off  If discussed at [employer], dates discussed:    Additional Comments:  Adonis Huguenin, RN 02/09/2016, 12:54 PM

## 2016-02-09 NOTE — Discharge Planning (Signed)
Patient IV removed.  DC papers printed, explained and educated - also placed in packet.  Report called to [place], s/w Merleen Nicely, RN - going to rm 211.  RN assessment and VS revealed stability for DC to facility. EMS contacted to transport.  Patient home Methodrexate was also returned to husband.

## 2016-02-09 NOTE — Care Management Important Message (Signed)
Important Message  Patient Details  Name: Michelle Cabrera MRN: [id number] Date of Birth: 24-Sep-1945   Medicare Important Message Given:  Yes    [name] [place] Lebeau 02/09/2016, 1:24 PM

## 2016-02-09 NOTE — Progress Notes (Signed)
Patient is medically stable for D/C to [facility] today. Per admissions coordinator at [facility] patient will go to room 211. RN will call report at [phone] and arrange EMS for transport. [facility] Medicare authorization has been received. Auth # [id number]. Clinical Child psychotherapist (CSW) sent D/C Summary, FL2 and D/C Packet to [employer] via [employer]. Patient is aware of above. Patient's husband Deniece Portela is at bedside and aware of above. CSW also contacted patient's daughter Misty Stanley and made her aware of above. Please reconsult if future social work needs arise. CSW signing off.   [employer], LCSW [phone]

## 2016-02-09 NOTE — Discharge Summary (Signed)
Michelle Cabrera, is a 70 y.o. female  DOB [birth year]  MRN [id number].  Admission date:  02/04/2016  Admitting Physician  Marguarite Arbour, MD  Discharge Date:  02/09/2016   Primary MD  Lauro Regulus., MD  Recommendations for primary care physician for things to follow:   Follow-up with primary doctor in 1 week   Admission Diagnosis  Compression fracture [T14.8]   Discharge Diagnosis  Compression fracture [T14.8]    Principal Problem:   Thoracic compression fracture [facility]) Active Problems:   Intractable low back pain   Rheumatoid arthritis (HCC)   UTI (lower urinary tract infection)      Past Medical History  Diagnosis Date  . Hypertension   . Closed compression fracture of L1 lumbar vertebra (HCC) 11/2012  . COPD (chronic obstructive pulmonary disease) (HCC)   . Anxiety   . Depression   . GERD (gastroesophageal reflux disease)   . Rheumatoid arthritis (HCC)   . DDD (degenerative disc disease)   . Chronic lower back pain   . Collagen vascular disease [facility])     Past Surgical History  Procedure Laterality Date  . Lumbar disc surgery       X 3  . Kyphoplasty N/A 01/04/2013    Procedure: L1 Kyphoplasty;  Surgeon: Hewitt Shorts, MD;  Location: [facility] NEURO ORS;  Service: Neurosurgery;  Laterality: N/A;  L1 Kyphoplasty  . Tubal ligation    . Foot surgery    . Wrist fusion Right     30 yrs ago  . Kyphoplasty N/A 02/07/2014    Procedure: THORACIC SIX KYPHOPLASTY;  Surgeon: Hewitt Shorts, MD;  Location: [facility] NEURO ORS;  Service: Neurosurgery;  Laterality: N/A;  T6 Kyphoplasty   . Hand tendon surgery Right 09/15/2014    "d/t RA"  . Tonsillectomy    . Posterior lumbar fusion      "got screws in"  . Back surgery    . Cataract extraction w/ intraocular lens  implant, bilateral Bilateral   . Finger  arthroplasty Right 09/15/2014    Procedure: RIGHT MIDDLE FINGER, RING FINGER, SMALL FINGER EXTENSOR DIGITORUM COMMUMIS STABILIZATION WITH RIGHT MIDDLE FINGER MCP ARTHROPLASTY, POSSIBLE RING FINGER AND SMALL FINGER MCP ARTHROPLASTY;  Surgeon: Dominica Severin, MD;  Location: [facility] OR;  Service: Orthopedics;  Laterality: Right;  . Breast biopsy Left     negative  . Peripheral vascular catheterization Left 12/07/2015    Procedure: Upper Extremity Angiography;  Surgeon: Annice Needy, MD;  Location: [facility] INVASIVE CV LAB;  Service: Cardiovascular;  Laterality: Left;  . Peripheral vascular catheterization  12/07/2015    Procedure: Upper Extremity Intervention;  Surgeon: Annice Needy, MD;  Location: [facility] INVASIVE CV LAB;  Service: Cardiovascular;;  . Kyphoplasty N/A 02/08/2016    Procedure: KYPHOPLASTY  T-12;  Surgeon: Kennedy Bucker, MD;  Location: [facility] ORS;  Service: Orthopedics;  Laterality: N/A;       History of present illness and  Hospital Course:     Kindly see H&P for history of present illness and admission details, please review complete Labs, Consult reports and Test reports for all details in brief  HPI  from the history and physical done on the day of admission  70 year-old female patient with history of positional hypertension, COPD, rheumatoid arthritis comes in because of the fall, back pain. Patient also has UTI.  found to have thoracic compression fractures, UTI and admitted for the same.  Hospital Course  #1 intractable low back pain: Asian T seen  by orthopedics, MRI of the spine ordered MRI of thoracic spine showed T12 acute compression fracture. Patient was on Plavix so initially patient Plavix is stopped. Patient did have kyphoplasty yesterday, on July 13. Patient continued on pain medications, she tolerated the procedure well. Patient is on Nucynta for pain control along with oxycodone. Physical therapy recommended rehabilitation. Family chose [facility] rehab.  She  can be discharged If the  arrangements are made. #2 Escherichia coli UTIs sensitive to all antibiotics. Initially received Rocephin, discharging her to rehabilitation with Keflex she will finish Keflex 500 mg by mouth twice a day and she needs to take it for 8 more days she is on it for last 2 days. #3 chronic respiratory failure due to COPD: Continue tobacco abuse: Oxygen 2 L by nasal cannula Pt on breo ellipta,Albuterol 4.depression: Continue on Seroquel, digoxin.   5 .constipation use stool softeners as needed  #6 .rheumatoid arthritis: Methotrexate, Enbrel. Follow up with her rheumatologist as an outpatient. 7 essential hypertension: Controlled. On metoprolol 8.Moderate bilateral carotid artery disease: Patient is on aspirin, Plavix. Plavix was held for  kyphoplasy.Patient can be started back on plavix at discharge,  Follow UP;PMD in [facility]  Follow-up Information    Follow up with [facility] .   Specialty:  Skilled Nursing Facility   Contact information:   [address] [place] [address] [phone]        Discharge Instructions  and  Discharge Medications        Medication List    TAKE these medications        albuterol 108 (90 Base) MCG/ACT inhaler  Commonly known as:  PROVENTIL HFA;VENTOLIN HFA  Inhale 2 puffs into the lungs every 6 (six) hours as needed for wheezing or shortness of breath.     aspirin EC 81 MG tablet  Take 1 tablet (81 mg total) by mouth daily.     BREO ELLIPTA 100-25 MCG/INH Aepb  Generic drug:  fluticasone furoate-vilanterol  Inhale 1 puff into the lungs every morning.     Calcium-Vitamin D-Minerals 600-400 MG-UNIT Chew  Chew 1 tablet by mouth 2 (two) times daily.     cephALEXin 500 MG capsule  Commonly known as:  KEFLEX  Take 1 capsule (500 mg total) by mouth every 12 (twelve) hours.     clopidogrel 75 MG tablet  Commonly known as:  PLAVIX  Take 1 tablet (75 mg total) by mouth daily.     DULoxetine 60 MG capsule  Commonly  known as:  CYMBALTA  Take 60 mg by mouth every morning. *Note dose*     DULoxetine 30 MG capsule  Commonly known as:  CYMBALTA  Take 30 mg by mouth at bedtime. *Note dose*     Etanercept 25 MG/0.5ML Sosy  Inject 1 mL into the skin once a week. *Give on Friday*     folic acid 1 MG tablet  Commonly known as:  FOLVITE  Take 1 mg by mouth daily.     gabapentin 300 MG capsule  Commonly known as:  NEURONTIN  Take 300 mg by mouth 3 (three) times daily as needed (leg pain).     ibuprofen 200 MG tablet  Commonly known as:  ADVIL,MOTRIN  Take 400 mg by mouth every 6 (six) hours as needed for mild pain. Reported on 12/07/2015     ipratropium-albuterol 0.5-2.5 (3) MG/3ML Soln  Commonly known as:  DUONEB  Take 3 mLs by nebulization every 6 (six) hours as needed (for shortness  of breath).     Methotrexate (PF) 25 MG/0.4ML Soaj  Inject 20 mg into the skin once a week. *Give on Friday*     metoprolol tartrate 25 MG tablet  Commonly known as:  LOPRESSOR  Take 25 mg by mouth 2 (two) times daily. Reported on 12/07/2015     nicotine 14 mg/24hr patch  Commonly known as:  NICODERM CQ - dosed in mg/24 hours  Place 14 mg onto the skin daily.     omeprazole 20 MG capsule  Commonly known as:  PRILOSEC  Take 20 mg by mouth 2 (two) times daily.     oxyCODONE-acetaminophen 10-325 MG tablet  Commonly known as:  PERCOCET  Take 1 tablet by mouth every 6 (six) hours as needed for pain.     polyethylene glycol packet  Commonly known as:  MIRALAX / GLYCOLAX  Take 17 g by mouth daily.     QUEtiapine 100 MG tablet  Commonly known as:  SEROQUEL  Take 1.5 tablets (150 mg total) by mouth at bedtime.     RECLAST 5 MG/100ML Soln injection  Generic drug:  zoledronic acid  Inject 5 mg into the vein See admin instructions. Reported on 12/07/2015     senna 8.6 MG Tabs tablet  Commonly known as:  SENOKOT  Take 1 tablet (8.6 mg total) by mouth daily.     tapentadol 50 MG Tabs tablet  Commonly known as:   NUCYNTA  Take 1 tablet (50 mg total) by mouth every 4 (four) hours as needed for moderate pain.     vitamin C 500 MG tablet  Commonly known as:  ASCORBIC ACID  Take 500 mg by mouth daily.          Diet and Activity recommendation: See Discharge Instructions above   Consults obtained -  Ortho,PT   Major procedures and Radiology Reports - PLEASE review detailed and final reports for all details, in brief -      Dg Thoracic Spine 2 View  02/08/2016  CLINICAL DATA:  Operative kyphoplasty. EXAM: THORACIC SPINE 2 VIEWS ; DG C-ARM 61-120 MIN COMPARISON:  02/04/2016 plain film and CT FINDINGS: Remote posterior fusion including L2. Remote kyphoplasty at L1. Interval kyphoplasty performed at T12. IMPRESSION: Interval kyphoplasty at T12. Electronically Signed   By: Norva Pavlov M.D.   On: 02/08/2016 09:52   Dg Lumbar Spine 2-3 Views  02/04/2016  CLINICAL DATA:  Pain following fall. Radicular symptoms left lower extremity EXAM: LUMBAR SPINE - 2-3 VIEW COMPARISON:  November 10, 2015 FINDINGS: Frontal, lateral, and spot lumbosacral lateral images were obtained. Bones are osteoporotic. There are 5 non-rib-bearing lumbar type vertebral bodies. Patient has had previous vertebroplasty at L1, stable. There is postoperative change with screw and plate fixation at L2, L3, L4 with a disc spacer at L3-4. The support hardware appears intact and stable. There is a new anterior wedge compression fracture of the T12 vertebral body. No other new fracture evident. There is no spondylolisthesis. There is degenerative disc disease at all levels, stable. Disc space narrowing is most marked at L1-2. There is atherosclerotic calcification in the aorta. IMPRESSION: There is a new fracture of the T12 vertebral body with moderate anterior wedging at T12. No other new fracture. Patient has had previous vertebroplasty at L1. No spondylolisthesis. Extensive postoperative change and arthropathy, stable. Bones osteoporotic. There  is extensive atherosclerotic calcification in the aorta. Electronically Signed   By: Bretta Bang III M.D.   On: 02/04/2016 07:40   Ct  Thoracic Spine Wo Contrast  02/04/2016  CLINICAL DATA:  70 year old female status post fall with thoracic back pain. Initial encounter. EXAM: CT THORACIC SPINE WITHOUT CONTRAST TECHNIQUE: Multidetector CT imaging of the thoracic spine was performed without intravenous contrast administration. Multiplanar CT image reconstructions were also generated. COMPARISON:  Chest radiographs 11/10/2015.  Chest CTA 02/26/2015. FINDINGS: Osteopenia. Congenital incomplete segmentation of T1-T2. Partially visible chronic L1 transpedicular fusion hardware. Sequelae of augmented T6 and L1 compression fractures is unchanged since 2016. A severe T5 compression fracture is new since that time, but stable since the chest radiographs in April. Mild to moderate superior endplate compression of T1 is new since 2016, and was not well visualized on the more recent radiographs. The compressed superior endplate is sclerotic (sagittal image 23). There is congenital interbody and some posterior element fusion of T1-T2. There is a comminuted fracture of the T12 vertebral body (sagittal image 23) with loss of height up to 30%. There is retropulsion of the posterior T12 vertebral body narrowing the AP spinal canal to 10 mm (versus 13-14 mm at an unaffected level). Fracture extends through the junction of the vertebral body and pedicles, but the T12 pedicles and posterior elements otherwise appear intact. Other thoracic vertebrae appear intact. Chronic posterior left fourth rib fracture is new since 2016. Chronic posterior right twelfth rib fracture is new since 2016. No acute displaced posterior rib fracture identified. Calcified aortic and coronary artery atherosclerosis. No pericardial effusion. Negative visualized noncontrast upper abdominal viscera. Increased septal thickening throughout the lungs compare  to 2016. Increased dependent opacity most resembling atelectasis. IMPRESSION: 1. Acute appearing comminuted T12 vertebral body fracture - a complex compression or mild burst-type fracture. Recommend Spine Surgery consultation. Retropulsion of bone resulting in mild spinal stenosis. Loss of height up to 30%. Underlying severe osteopenia. 2. Severe T5 compression fracture is stable since April. Mild T1 superior endplate compression fracture is favored to be subacute in light of endplate sclerosis. 3. Chronically augmented T6 and L1 compression fractures. Occasional chronic posterior rib fractures. No acute posterior rib fracture identified. 4. Dependent bilateral pulmonary atelectasis. Increased pulmonary septal thickening suggesting a degree of interstitial edema. 5. Calcified aortic atherosclerosis. Electronically Signed   By: Odessa Fleming M.D.   On: 02/04/2016 10:22   Mr Thoracic Spine Wo Contrast  02/05/2016  CLINICAL DATA:  Chronic back pain. Fall. Acute T12 compression on CT. Compression fracture at T5. EXAM: MRI THORACIC SPINE WITHOUT CONTRAST TECHNIQUE: Multiplanar, multisequence MR imaging of the thoracic spine was performed. No intravenous contrast was administered. COMPARISON:  Multiple exams, including 02/04/2016 CT scan FINDINGS: Alignment: There is 3 mm of retrolisthesis at L2- 3 seen on the bottom most images. Vertebrae: Partial interbody fusion at T1- 2 with some rudimentary disc material anteriorly. Vertebra plana at T5, slightly worse than before, compatible with compression fracture, 3 mm posterior bony retropulsion. Old vertebral augmentation at T6 with about 50% loss of height, but no current edema at the T6 level. T12 fracture involves the anterior, posterior, and superior margins of the vertebral body and has 5-6 mm of posterior bony retropulsion. Remote prior L1 compression fracture with vertebral augmentation in 2-3 mm posterior bony retropulsion which appears chronic. There is some chronic  anterior wedging of T1. Despite efforts by the technologist and patient, motion artifact is present on today's exam and could not be eliminated. This reduces exam sensitivity and specificity. Cord: Subject to loss of sensitivity due to motion artifact, no significant abnormal cord signal is identified. Paraspinal and other  soft tissues: A fluid signal intensity lesion of the left kidney is partially characterized on today's exam. This is statistically likely to be a cyst but not technically specific. Bilateral perirenal stranding. Trace pleural fluid bilaterally. There is likely dependent atelectasis in both lungs. 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 T2-3:  Grossly no impingement. T3-4: Grossly no impingement. T4-5: No impingement associated with the posterior retropulsion from T5. T5-6:  Questionable mild left foraminal stenosis due to spurring. T6-7:  No impingement identified.  Mild disc bulge. T7- 8:  Unremarkable. T8- 9:  Unremarkable. T9-10: No impingement. Mild left eccentric disc bulge and facet arthropathy. T10-11: Borderline central narrowing of the thecal sac due to disc bulge. T11-12: Low T1 and T2 signal in the left lateral recess tracking cephalad from this level potentially representing disc material or less likely epidural blood products associated with the T12 compression. This causes mild left eccentric central narrowing of the thecal sac at the T12 level there is an overall mild to moderate degree of central narrowing of the thecal sac due to the posterior bony retropulsion and possible epidural blood products along the posterior margin of the vertebral body. No definite cord edema. T12-L1: No impingement. Minimal disc bulge and slight posterior bony retropulsion from the remote L1 compression. L1-2:  No impingement, diffuse disc bulge. L2-3:  Poorly seen due to the hardware, no definite impingement. IMPRESSION: 1. Mild-to-moderate central narrowing of the thecal sac primarily at the T12 level  due to posterior retropulsion from the T12 vertebral body potentially with some epidural blood products tracking along this level. This is not causing a critical degree of stenosis currently, and we do not see any definite abnormal cord edema signal. Sensitivity mildly adversely affected by the degree of motion artifact on very sequences. This fracture has components extending transversely through the vertebral body to the anterior and posterior surfaces, as well as to the superior endplate. The 2. Essentially vertebra plana at T5 due to compression fracture with subacute component. All compression fractures at T6 and L1 with vertebral augmentations at those levels. Mild kyphotic deformity at T5. 3. Questionable mild left foraminal stenosis at T5-6 due to spurring. 4. Trace bilateral pleural effusions with dependent atelectasis in the lungs. Electronically Signed   By: Gaylyn Rong M.D.   On: 02/05/2016 14:09   Dg C-arm 61-120 Min  02/08/2016  CLINICAL DATA:  Operative kyphoplasty. EXAM: THORACIC SPINE 2 VIEWS ; DG C-ARM 61-120 MIN COMPARISON:  02/04/2016 plain film and CT FINDINGS: Remote posterior fusion including L2. Remote kyphoplasty at L1. Interval kyphoplasty performed at T12. IMPRESSION: Interval kyphoplasty at T12. Electronically Signed   By: Norva Pavlov M.D.   On: 02/08/2016 09:52    Micro Results    Recent Results (from the past 240 hour(s))  Urine culture     Status: Abnormal   Collection Time: 02/04/16  8:15 AM  Result Value Ref Range Status   Specimen Description URINE, CATHETERIZED  Final   Special Requests NONE  Final   Culture >=100,000 COLONIES/mL ESCHERICHIA COLI (A)  Final   Report Status 02/07/2016 FINAL  Final   Organism ID, Bacteria ESCHERICHIA COLI (A)  Final      Susceptibility   Escherichia coli - MIC*    AMPICILLIN 4 SENSITIVE Sensitive     CEFAZOLIN <=4 SENSITIVE Sensitive     CEFTRIAXONE <=1 SENSITIVE Sensitive     CIPROFLOXACIN <=0.25 SENSITIVE  Sensitive     GENTAMICIN 2 SENSITIVE Sensitive     IMIPENEM <=0.25  SENSITIVE Sensitive     NITROFURANTOIN 32 SENSITIVE Sensitive     TRIMETH/SULFA <=20 SENSITIVE Sensitive     AMPICILLIN/SULBACTAM 4 SENSITIVE Sensitive     PIP/TAZO <=4 SENSITIVE Sensitive     * >=100,000 COLONIES/mL ESCHERICHIA COLI       Today   Subjective:   Itzamara Casas today has Back pain. Stable  For discharge to rehab. Objective:   Blood pressure 120/59, pulse 93, temperature 98.2 F (36.8 C), temperature source Oral, resp. rate 18, height 5\' 2"  (1.575 m), weight 61.281 kg (135 lb 1.6 oz), SpO2 96 %.   Intake/Output Summary (Last 24 hours) at 02/09/16 0830 Last data filed at 02/08/16 2019  Gross per 24 hour  Intake    850 ml  Output    400 ml  Net    450 ml    Exam Awake Alert, Oriented x 3, No new F.N deficits, Normal affect [place].AT,PERRAL Supple Neck,No JVD, No cervical lymphadenopathy appriciated.  Symmetrical Chest wall movement, Good air movement bilaterally, CTAB RRR,No Gallops,Rubs or new Murmurs, No Parasternal Heave +ve B.Sounds, Abd Soft, Non tender, No organomegaly appriciated, No rebound -guarding or rigidity. No Cyanosis, Clubbing or edema, No new Rash or bruise  Data Review   CBC w Diff: Lab Results  Component Value Date   WBC 11.1* 02/09/2016   HGB 10.2* 02/09/2016   HCT 30.3* 02/09/2016   PLT 323 02/09/2016   LYMPHOPCT 11 02/04/2016   BANDSPCT 1 09/20/2015   MONOPCT 6 02/04/2016   EOSPCT 0 02/04/2016   BASOPCT 0 02/04/2016    CMP: Lab Results  Component Value Date   NA 140 02/09/2016   K 3.5 02/09/2016   CL 108 02/09/2016   CO2 24 02/09/2016   BUN 14 02/09/2016   CREATININE 0.45 02/09/2016   PROT 6.8 02/05/2016   ALBUMIN 2.9* 02/05/2016   BILITOT 0.7 02/05/2016   ALKPHOS 121 02/05/2016   AST 22 02/05/2016   ALT 17 02/05/2016  .   Total Time in preparing paper work, data evaluation and todays exam - 35 minutes  Dawsyn Ramsaran M.D on 02/09/2016 at 8:30  AM    Note: This dictation was prepared with Dragon dictation along with smaller phrase technology. Any transcriptional errors that result from this process are unintentional.

## 2016-02-09 NOTE — Clinical Social Work Placement (Signed)
   CLINICAL SOCIAL WORK PLACEMENT  NOTE  Date:  02/09/2016  Patient Details  Name: Michelle Cabrera MRN: [id number] Date of Birth: 11/23/1945  Clinical Social Work is seeking post-discharge placement for this patient at the Skilled  Nursing Facility level of care (*CSW will initial, date and re-position this form in  chart as items are completed):  Yes   Patient/family provided with [facility] Clinical Social Work Department's list of facilities offering this level of care within the geographic area requested by the patient (or if unable, by the patient's family).  Yes   Patient/family informed of their freedom to choose among providers that offer the needed level of care, that participate in Medicare, Medicaid or managed care program needed by the patient, have an available bed and are willing to accept the patient.  Yes   Patient/family informed of [facility]'s ownership interest in [facility] and [facility], as well as of the fact that they are under no obligation to receive care at these facilities.  PASRR submitted to [employer] on 02/05/16 (PASARR was expired and a new PASARR was submitted )     PASRR number received on 02/05/16 ([id number] E Expires 03/06/2016)     Existing PASRR number confirmed on       FL2 transmitted to all facilities in geographic area requested by pt/family on 02/05/16     FL2 transmitted to all facilities within larger geographic area on       Patient informed that his/her managed care company has contracts with or will negotiate with certain facilities, including the following:        Yes   Patient/family informed of bed offers received.  Patient chooses bed at  [facility] )     Physician recommends and patient chooses bed at      Patient to be transferred to  [facility] ) on 02/09/16.  Patient to be transferred to facility by  [facility] )     Patient family notified on 02/09/16 of transfer.  Name of family member  notified:   (Patient's husband Deniece Portela and dsughter Misty Stanley ware of D/C today. )     PHYSICIAN       Additional Comment:    _______________________________________________ Janard Culp, Ladon Applebaum, LCSW 02/09/2016, 12:10 PM

## 2016-02-09 NOTE — Progress Notes (Signed)
Physical Therapy Treatment Patient Details Name: Michelle Cabrera MRN: [id number] DOB: 17-Nov-1945 Today's Date: 02/09/2016    History of Present Illness Pt is a 70 y.o. female s/p mechanical fall (pt was dizzy/lightheaded) and c/o severe back pain.  Imaging showing comminuted T12 vertebral body fx (complex compression or mid burst type fx with retropulsion).  Imaging also showing stable severe T5 compression fx, chronic T6 and L2 compression fx, and chronic posterior rib fx's.  Pt admitted for UTI and pain control.  Pt s/p T12 kyphoplasty 02/08/16.  PMH includes htn, COPD, anxiety, RA, R wrist fusion.  Of note, pt with hospital admission about 3 months ago for syncopal episode/fall on steps.    PT Comments    Pt with slight progress towards mobility goals, demonstrated by decreased assist levels and increased ambulation distance. Pt able to ambulate x32ft with RW and mod A x1 and a second person min A for safety. Decreased orientation persists, pt disoriented to place with unusual conversation throughout session, and rather impulsive. Pt requires max cueing to maintain spinal precautions. Will continue to follow to progress tolerance for and independence with functional mobility.   Follow Up Recommendations  SNF     Equipment Recommendations  Rolling walker with 5" wheels    Recommendations for Other Services       Precautions / Restrictions Precautions Precautions: Fall;Back Precaution Comments: spinal precautions s/p kyphoplasty; per verbal from MD Rosita Kea 02/08/16, pt does not need to wear brace (pt has TLSO in room) unless she wants to (nursing reports pt gets agitated when trying to put brace on) Restrictions Weight Bearing Restrictions: No    Mobility  Bed Mobility Overal bed mobility: Needs Assistance Bed Mobility: Supine to Sit (x 2 trials) Supine to sit: Mod assist  General bed mobility comments: Zero carryover in terms of log roll technique. Max education/cueing to perform x2.  Uses bed rails and mod A x1 at trunk to assume sit. Upon initial sit, pt with c/o dizziness and "needs to lie back down". With increased encouragement, re-achieves sitting EOB and does not c/o dizziness again.  Transfers Overall transfer level: Needs assistance Equipment used: Rolling walker (2 wheeled) Transfers: Sit to/from Stand (x 2 trials; EOB and BSC) Sit to Stand: Min assist;+2 physical assistance General transfer comment: Hand over hand cueing for hand placement. Vc's for safe hand/foot placement, DME use, and sequencing. R foot requires blocking to prevent from sliding out with stand.   Ambulation/Gait Ambulation/Gait assistance: Min assist;Mod assist;+2 physical assistance Ambulation Distance (Feet): 20 Feet Assistive device: Rolling walker (2 wheeled) Gait Pattern/deviations: Antalgic;Decreased step length - right;Decreased step length - left Gait velocity: decreased General Gait Details: Mildly unsteady. Requires mod A for progression of RW and maintenance of linear path. Mildly unsteady. Decreased concentration on task.  Comments: increased time required for all mobility secondary to constant education and re-education on back precautions and safe mobility techniques.       Balance Overall balance assessment: Needs assistance Sitting-balance support: Bilateral upper extremity supported;Feet supported Sitting balance-Leahy Scale: Poor Sitting balance - Comments: fluctuating close standby to min A to maintain upright Postural control: Posterior lean Standing balance support: Bilateral upper extremity supported (on RW) Standing balance-Leahy Scale: Poor Standing balance comment: pt requiring CGA-min A to prevent LOB    Cognition Arousal/Alertness: Awake/alert Behavior During Therapy: Impulsive Overall Cognitive Status: Impaired/Different from baseline Area of Impairment: Orientation;Following commands Orientation Level: Disoriented to;Place   Memory: Decreased recall  of precautions Following Commands: Follows one step commands  inconsistently    Exercises General Exercises - Lower Extremity Ankle Circles/Pumps: AROM Quad Sets: AROM Gluteal Sets: AROM Short Arc Quad: AROM Heel Slides: AROM Hip ABduction/ADduction: AROM Straight Leg Raises: AROM  All exercises performed bilaterally x 10 reps in supine.     General Comments General comments (skin integrity, edema, etc.): Post-surgical bandage mid back in tact. Nursing cleared pt for participation in physical therapy.  Pt agreeable to PT session.        Pertinent Vitals/Pain Pain Assessment: 0-10 Pain Score: 8  Pain Location: back Pain Intervention(s): Limited activity within patient's tolerance;Monitored during session;Repositioned (RN notified)           PT Goals (current goals can now be found in the care plan section) Acute Rehab PT Goals Patient Stated Goal: to bring pt home PT Goal Formulation: With family Time For Goal Achievement: 02/22/16 Potential to Achieve Goals: Fair Progress towards PT goals: Progressing toward goals    Frequency  BID    PT Plan Current plan remains appropriate       End of Session Equipment Utilized During Treatment: Gait belt;Oxygen (3L O2) Activity Tolerance: Patient limited by fatigue;Patient limited by pain Patient left: in chair;with call bell/phone within reach;with chair alarm set     Time: [phone] PT Time Calculation (min) (ACUTE ONLY): 43 min  Charges:                       G Codes:      Francia Verry, SPT 02/09/2016, 11:29 AM

## 2016-02-25 DIAGNOSIS — R410 Disorientation, unspecified: Secondary | ICD-10-CM | POA: Diagnosis present

## 2016-02-25 LAB — URINALYSIS COMPLETE WITH MICROSCOPIC (ARMC ONLY)
Bilirubin Urine: NEGATIVE
Glucose, UA: NEGATIVE mg/dL
Hgb urine dipstick: NEGATIVE
Ketones, ur: NEGATIVE mg/dL
Nitrite: POSITIVE — AB
PROTEIN: NEGATIVE mg/dL
SPECIFIC GRAVITY, URINE: 1.017 (ref 1.005–1.030)
TRANS EPITHEL UA: 1
pH: 6 (ref 5.0–8.0)

## 2016-02-27 ENCOUNTER — Encounter
Admission: RE | Admit: 2016-02-27 | Discharge: 2016-02-27 | Disposition: A | Discharge status: Home / Self Care | Payer: Medicare Other | Source: Ambulatory Visit | Attending: Internal Medicine | Admitting: Internal Medicine

## 2016-02-28 LAB — URINE CULTURE: Culture: >=100000 — AB

## 2016-03-01 ENCOUNTER — Non-Acute Institutional Stay (SKILLED_NURSING_FACILITY): Payer: Medicare Other | Admitting: Gerontology

## 2016-03-01 DIAGNOSIS — S22000D Wedge compression fracture of unspecified thoracic vertebra, subsequent encounter for fracture with routine healing: Secondary | ICD-10-CM | POA: Diagnosis not present

## 2016-03-02 NOTE — Progress Notes (Signed)
Location:      Place of Service:  SNF (31)  Provider: Lorenso Quarry, NP-C  PCP: Lauro Regulus., MD Patient Care Team: Lauro Regulus, MD as PCP - General (Internal Medicine) Mertie Moores, MD as Consulting Physician (Specialist)  Extended Emergency Contact Information Primary Emergency Contact: [name] Address: [address]          [place], [place] [address] D[place] of [place] Home Phone: [phone] Relation: Spouse Secondary Emergency Contact: [name],[name] Address: [address]          [place], [place] [address] D[place] of [place] Home Phone: [phone] Mobile Phone: [phone] Relation: Daughter  Code Status: Full Goals of care:  Advanced Directive information Advanced Directives 02/04/2016  Does patient have an advance directive? No  Would patient like information on creating an advanced directive? No - patient declined information  Pre-existing out of facility DNR order (yellow form or pink MOST form) -     Allergies  Allergen Reactions  . Ambien [Zolpidem Tartrate]     Reported by patient  . Gold-Containing Drug Products Anaphylaxis  . Other     Other reaction(s): Unknown  . Adhesive [Tape]     Tears skin  . Dilaudid [Hydromorphone Hcl]     Made tongue swell  . Haldol [Haloperidol] Other (See Comments)    Hallucinations  . Pravastatin     Arms and legs hurt     Chief Complaint  Patient presents with  . Discharge Note    HPI:  70 y.o. female at the facility for rehab following a thoracic compression fracture, with routine healing. She underwent a kyphoplasty for the fracture. She reports pain as not being managed, but that she is "always in pain." This is not a new occurrence. Pt progressed well with physical therapy and now feels she is ready to go home. No c/o chest pain, dyspnea. VSS. All questions answered.     Past Medical History:  Diagnosis Date  . Anxiety   . Chronic lower back pain   . Closed compression  fracture of L1 lumbar vertebra (HCC) 11/2012  . Collagen vascular disease (HCC)   . COPD (chronic obstructive pulmonary disease) (HCC)   . DDD (degenerative disc disease)   . Depression   . GERD (gastroesophageal reflux disease)   . Hypertension   . Rheumatoid arthritis [facility])     Past Surgical History:  Procedure Laterality Date  . BACK SURGERY    . BREAST BIOPSY Left    negative  . CATARACT EXTRACTION W/ INTRAOCULAR LENS  IMPLANT, BILATERAL Bilateral   . FINGER ARTHROPLASTY Right 09/15/2014   Procedure: RIGHT MIDDLE FINGER, RING FINGER, SMALL FINGER EXTENSOR DIGITORUM COMMUMIS STABILIZATION WITH RIGHT MIDDLE FINGER MCP ARTHROPLASTY, POSSIBLE RING FINGER AND SMALL FINGER MCP ARTHROPLASTY;  Surgeon: Dominica Severin, MD;  Location: [facility] OR;  Service: Orthopedics;  Laterality: Right;  . FOOT SURGERY    . HAND TENDON SURGERY Right 09/15/2014   "d/t RA"  . KYPHOPLASTY N/A 01/04/2013   Procedure: L1 Kyphoplasty;  Surgeon: Hewitt Shorts, MD;  Location: [facility] NEURO ORS;  Service: Neurosurgery;  Laterality: N/A;  L1 Kyphoplasty  . KYPHOPLASTY N/A 02/07/2014   Procedure: THORACIC SIX KYPHOPLASTY;  Surgeon: Hewitt Shorts, MD;  Location: [facility] NEURO ORS;  Service: Neurosurgery;  Laterality: N/A;  T6 Kyphoplasty   . KYPHOPLASTY N/A 02/08/2016   Procedure: KYPHOPLASTY  T-12;  Surgeon: Kennedy Bucker, MD;  Location: [facility] ORS;  Service: Orthopedics;  Laterality: N/A;  . LUMBAR DISC  SURGERY      X 3  . PERIPHERAL VASCULAR CATHETERIZATION Left 12/07/2015   Procedure: Upper Extremity Angiography;  Surgeon: Annice Needy, MD;  Location: [facility] INVASIVE CV LAB;  Service: Cardiovascular;  Laterality: Left;  . PERIPHERAL VASCULAR CATHETERIZATION  12/07/2015   Procedure: Upper Extremity Intervention;  Surgeon: Annice Needy, MD;  Location: [facility] INVASIVE CV LAB;  Service: Cardiovascular;;  . POSTERIOR LUMBAR FUSION     "got screws in"  . TONSILLECTOMY    . TUBAL LIGATION    . WRIST FUSION Right    30 yrs ago       reports that she quit smoking about 7 months ago. Her smoking use included Cigarettes. She has a 53.00 pack-year smoking history. She quit smokeless tobacco use about 7 months ago. She reports that she does not drink alcohol or use drugs. Social History   Social History  . Marital status: Married    Spouse name: N/A  . Number of children: N/A  . Years of education: N/A   Occupational History  . retired    Social History Main Topics  . Smoking status: Former Smoker    Packs/day: 1.00    Years: 53.00    Types: Cigarettes    Quit date: 07/25/2015  . Smokeless tobacco: Former Neurosurgeon    Quit date: 07/15/2015  . Alcohol use No  . Drug use: No  . Sexual activity: Yes   Other Topics Concern  . Not on file   Social History Narrative   Lives with husband at home   Functional Status Survey:    Allergies  Allergen Reactions  . Ambien [Zolpidem Tartrate]     Reported by patient  . Gold-Containing Drug Products Anaphylaxis  . Other     Other reaction(s): Unknown  . Adhesive [Tape]     Tears skin  . Dilaudid [Hydromorphone Hcl]     Made tongue swell  . Haldol [Haloperidol] Other (See Comments)    Hallucinations  . Pravastatin     Arms and legs hurt     Pertinent  Health Maintenance Due  Topic Date Due  . FOOT EXAM  10/16/1955  . OPHTHALMOLOGY EXAM  10/16/1955  . URINE MICROALBUMIN  [birth year]16/1955  . COLONOSCOPY  10/16/1995  . DEXA SCAN  10/16/2010  . PNA vac Low Risk Adult (1 of 2 - PCV13) 10/16/2010  . INFLUENZA VACCINE  02/27/2016  . HEMOGLOBIN A1C  03/05/2016  . MAMMOGRAM  05/24/2017    Medications:   Medication List       Accurate as of 03/01/16 11:59 PM. Always use your most recent med list.          albuterol 108 (90 Base) MCG/ACT inhaler Commonly known as:  PROVENTIL HFA;VENTOLIN HFA Inhale 2 puffs into the lungs every 6 (six) hours as needed for wheezing or shortness of breath.   aspirin EC 81 MG tablet Take 1 tablet (81 mg total) by mouth daily.     BREO ELLIPTA 100-25 MCG/INH Aepb Generic drug:  fluticasone furoate-vilanterol Inhale 1 puff into the lungs every morning.   Calcium-Vitamin D-Minerals 600-400 MG-UNIT Chew Chew 1 tablet by mouth 2 (two) times daily.   cephALEXin 500 MG capsule Commonly known as:  KEFLEX Take 1 capsule (500 mg total) by mouth every 12 (twelve) hours.   clopidogrel 75 MG tablet Commonly known as:  PLAVIX Take 1 tablet (75 mg total) by mouth daily.   DULoxetine 60 MG capsule Commonly known as:  CYMBALTA Take 60  mg by mouth every morning. *Note dose*   DULoxetine 30 MG capsule Commonly known as:  CYMBALTA Take 30 mg by mouth at bedtime. *Note dose*   Etanercept 25 MG/0.5ML Sosy Inject 1 mL into the skin once a week. *Give on Friday*   folic acid 1 MG tablet Commonly known as:  FOLVITE Take 1 mg by mouth daily.   gabapentin 300 MG capsule Commonly known as:  NEURONTIN Take 300 mg by mouth 3 (three) times daily as needed (leg pain).   ibuprofen 200 MG tablet Commonly known as:  ADVIL,MOTRIN Take 400 mg by mouth every 6 (six) hours as needed for mild pain. Reported on 12/07/2015   ipratropium-albuterol 0.5-2.5 (3) MG/3ML Soln Commonly known as:  DUONEB Take 3 mLs by nebulization every 6 (six) hours as needed (for shortness of breath).   Methotrexate (PF) 25 MG/0.4ML Soaj Inject 20 mg into the skin once a week. *Give on Friday*   metoprolol tartrate 25 MG tablet Commonly known as:  LOPRESSOR Take 25 mg by mouth 2 (two) times daily. Reported on 12/07/2015   nicotine 14 mg/24hr patch Commonly known as:  NICODERM CQ - dosed in mg/24 hours Place 14 mg onto the skin daily.   omeprazole 20 MG capsule Commonly known as:  PRILOSEC Take 20 mg by mouth 2 (two) times daily.   oxyCODONE-acetaminophen 10-325 MG tablet Commonly known as:  PERCOCET Take 1 tablet by mouth every 6 (six) hours as needed for pain.   polyethylene glycol packet Commonly known as:  MIRALAX / GLYCOLAX Take 17 g by  mouth daily.   QUEtiapine 100 MG tablet Commonly known as:  SEROQUEL Take 1.5 tablets (150 mg total) by mouth at bedtime.   RECLAST 5 MG/100ML Soln injection Generic drug:  zoledronic acid Inject 5 mg into the vein See admin instructions. Reported on 12/07/2015   senna 8.6 MG Tabs tablet Commonly known as:  SENOKOT Take 1 tablet (8.6 mg total) by mouth daily.   tapentadol 50 MG tablet Commonly known as:  NUCYNTA Take 1 tablet (50 mg total) by mouth every 4 (four) hours as needed for moderate pain.   vitamin C 500 MG tablet Commonly known as:  ASCORBIC ACID Take 500 mg by mouth daily.       Review of Systems  Constitutional: Positive for appetite change. Negative for activity change, chills, diaphoresis and fever.  Respiratory: Negative for apnea, cough, choking, chest tightness, shortness of breath and wheezing.   Cardiovascular: Negative for chest pain, palpitations and leg swelling.  Gastrointestinal: Negative for abdominal distention, abdominal pain, constipation, diarrhea and nausea.  Genitourinary: Negative for difficulty urinating, dysuria, frequency and urgency.  Musculoskeletal: Positive for arthralgias (typical arthritis), back pain, gait problem and myalgias.  Skin: Negative for color change, pallor, rash and wound.  Neurological: Positive for dizziness (pt to follow up with ENT ). Negative for tremors, syncope, speech difficulty, weakness, numbness and headaches.  Psychiatric/Behavioral: Negative for agitation and behavioral problems.  All other systems reviewed and are negative.   Vitals:   03/01/16 0500  BP: 138/67  Pulse: 82  Resp: 18  Temp: 98.1 F (36.7 C)  SpO2: 98%   There is no height or weight on file to calculate BMI. Physical Exam  Constitutional: She is oriented to person, place, and time. Vital signs are normal. She appears well-developed and well-nourished. She is active and cooperative. She does not appear ill. No distress.  HENT:  Head:  Normocephalic and atraumatic.  Mouth/Throat: Uvula is midline,  oropharynx is clear and moist and mucous membranes are normal. Mucous membranes are not pale, not dry and not cyanotic.  Eyes: Conjunctivae, EOM and lids are normal. Pupils are equal, round, and reactive to light.  Neck: Trachea normal, normal range of motion and full passive range of motion without pain. Neck supple. No JVD present. No tracheal deviation, no edema and no erythema present. No thyromegaly present.  Cardiovascular: Normal rate, regular rhythm, normal heart sounds, intact distal pulses and normal pulses.  Exam reveals no gallop, no distant heart sounds and no friction rub.   No murmur heard. Pulmonary/Chest: Effort normal and breath sounds normal. No accessory muscle usage. No respiratory distress. She has no wheezes. She exhibits no tenderness.  Abdominal: Soft. Normal appearance and bowel sounds are normal. She exhibits no distension and no ascites. There is no tenderness.  Musculoskeletal: Normal range of motion. She exhibits no edema or tenderness.  Expected osteoarthritis, stiffness  Neurological: She is alert and oriented to person, place, and time. She has normal strength.  Skin: Skin is warm, dry and intact. She is not diaphoretic. No cyanosis. No pallor. Nails show no clubbing.  Psychiatric: She has a normal mood and affect. Her speech is normal and behavior is normal. Judgment and thought content normal. Cognition and memory are normal.  Nursing note and vitals reviewed.   Labs reviewed: Basic Metabolic Panel:  Recent Labs  25/42/70 1651  09/07/15 0540 09/08/15 0507  02/04/16 0657 02/05/16 0343 02/09/16 0454  NA  --   < > 141 138  < > 137 137 140  K  --   < > 4.0 3.4*  < > 3.4* 3.5 3.5  CL  --   < > 109 107  < > 102 105 108  CO2  --   < > 26 23  < > 28 25 24   GLUCOSE  --   < > 196* 169*  < > 131* 144* 93  BUN  --   < > 9 9  < > 27* 16 14  CREATININE  --   < > 0.38* 0.48  < > 0.69 0.48 0.45    CALCIUM  --   < > 7.6* 7.6*  < > 8.5* 8.5* 8.4*  MG 2.4  --  1.2* 1.5*  --   --   --   --   < > = values in this interval not displayed. Liver Function Tests:  Recent Labs  10/19/15 1342 02/04/16 0657 02/05/16 0343  AST 24 30 22   ALT 15 20 17   ALKPHOS 110 119 121  BILITOT 0.4 0.4 0.7  PROT 6.9 7.5 6.8  ALBUMIN 3.3* 3.3* 2.9*   No results for input(s): LIPASE, AMYLASE in the last 8760 hours. No results for input(s): AMMONIA in the last 8760 hours. CBC:  Recent Labs  09/20/15 1050 10/19/15 1342  02/04/16 0657 02/05/16 0343 02/08/16 0500 02/09/16 0454  WBC 15.6* 10.5  < > 25.2* 18.3* 12.7* 11.1*  NEUTROABS 12.1* 5.7  --  20.9*  --   --   --   HGB 10.2* 11.1*  < > 12.1 11.2* 11.1* 10.2*  HCT 31.3* 34.1*  < > 35.4 34.0* 33.3* 30.3*  MCV 91.7 97.8  < > 89.9 91.3 91.8 90.7  PLT 880* 535*  < > 436 394 357 323  < > = values in this interval not displayed. Cardiac Enzymes:  Recent Labs  11/10/15 0537 11/10/15 1048 11/10/15 1656  TROPONINI <0.03 <0.03 <0.03  BNP: Invalid input(s): POCBNP CBG:  Recent Labs  02/06/16 0802 02/07/16 0809 02/09/16 0724  GLUCAP 123* 132* 96    Procedures and Imaging Studies During Stay: Dg Thoracic Spine 2 View  Result Date: 02/08/2016 CLINICAL DATA:  Operative kyphoplasty. EXAM: THORACIC SPINE 2 VIEWS ; DG C-ARM 61-120 MIN COMPARISON:  02/04/2016 plain film and CT FINDINGS: Remote posterior fusion including L2. Remote kyphoplasty at L1. Interval kyphoplasty performed at T12. IMPRESSION: Interval kyphoplasty at T12. Electronically Signed   By: Norva Pavlov M.D.   On: 02/08/2016 09:52   Dg Lumbar Spine 2-3 Views  Result Date: 02/04/2016 CLINICAL DATA:  Pain following fall. Radicular symptoms left lower extremity EXAM: LUMBAR SPINE - 2-3 VIEW COMPARISON:  November 10, 2015 FINDINGS: Frontal, lateral, and spot lumbosacral lateral images were obtained. Bones are osteoporotic. There are 5 non-rib-bearing lumbar type vertebral bodies.  Patient has had previous vertebroplasty at L1, stable. There is postoperative change with screw and plate fixation at L2, L3, L4 with a disc spacer at L3-4. The support hardware appears intact and stable. There is a new anterior wedge compression fracture of the T12 vertebral body. No other new fracture evident. There is no spondylolisthesis. There is degenerative disc disease at all levels, stable. Disc space narrowing is most marked at L1-2. There is atherosclerotic calcification in the aorta. IMPRESSION: There is a new fracture of the T12 vertebral body with moderate anterior wedging at T12. No other new fracture. Patient has had previous vertebroplasty at L1. No spondylolisthesis. Extensive postoperative change and arthropathy, stable. Bones osteoporotic. There is extensive atherosclerotic calcification in the aorta. Electronically Signed   By: Bretta Bang III M.D.   On: 02/04/2016 07:40   Ct Thoracic Spine Wo Contrast  Result Date: 02/04/2016 CLINICAL DATA:  70 year old female status post fall with thoracic back pain. Initial encounter. EXAM: CT THORACIC SPINE WITHOUT CONTRAST TECHNIQUE: Multidetector CT imaging of the thoracic spine was performed without intravenous contrast administration. Multiplanar CT image reconstructions were also generated. COMPARISON:  Chest radiographs 11/10/2015.  Chest CTA 02/26/2015. FINDINGS: Osteopenia. Congenital incomplete segmentation of T1-T2. Partially visible chronic L1 transpedicular fusion hardware. Sequelae of augmented T6 and L1 compression fractures is unchanged since 2016. A severe T5 compression fracture is new since that time, but stable since the chest radiographs in April. Mild to moderate superior endplate compression of T1 is new since 2016, and was not well visualized on the more recent radiographs. The compressed superior endplate is sclerotic (sagittal image 23). There is congenital interbody and some posterior element fusion of T1-T2. There is a  comminuted fracture of the T12 vertebral body (sagittal image 23) with loss of height up to 30%. There is retropulsion of the posterior T12 vertebral body narrowing the AP spinal canal to 10 mm (versus 13-14 mm at an unaffected level). Fracture extends through the junction of the vertebral body and pedicles, but the T12 pedicles and posterior elements otherwise appear intact. Other thoracic vertebrae appear intact. Chronic posterior left fourth rib fracture is new since 2016. Chronic posterior right twelfth rib fracture is new since 2016. No acute displaced posterior rib fracture identified. Calcified aortic and coronary artery atherosclerosis. No pericardial effusion. Negative visualized noncontrast upper abdominal viscera. Increased septal thickening throughout the lungs compare to 2016. Increased dependent opacity most resembling atelectasis. IMPRESSION: 1. Acute appearing comminuted T12 vertebral body fracture - a complex compression or mild burst-type fracture. Recommend Spine Surgery consultation. Retropulsion of bone resulting in mild spinal stenosis. Loss of height up to 30%.  Underlying severe osteopenia. 2. Severe T5 compression fracture is stable since April. Mild T1 superior endplate compression fracture is favored to be subacute in light of endplate sclerosis. 3. Chronically augmented T6 and L1 compression fractures. Occasional chronic posterior rib fractures. No acute posterior rib fracture identified. 4. Dependent bilateral pulmonary atelectasis. Increased pulmonary septal thickening suggesting a degree of interstitial edema. 5. Calcified aortic atherosclerosis. Electronically Signed   By: Odessa Fleming M.D.   On: 02/04/2016 10:22   Mr Thoracic Spine Wo Contrast  Result Date: 02/05/2016 CLINICAL DATA:  Chronic back pain. Fall. Acute T12 compression on CT. Compression fracture at T5. EXAM: MRI THORACIC SPINE WITHOUT CONTRAST TECHNIQUE: Multiplanar, multisequence MR imaging of the thoracic spine was  performed. No intravenous contrast was administered. COMPARISON:  Multiple exams, including 02/04/2016 CT scan FINDINGS: Alignment: There is 3 mm of retrolisthesis at L2- 3 seen on the bottom most images. Vertebrae: Partial interbody fusion at T1- 2 with some rudimentary disc material anteriorly. Vertebra plana at T5, slightly worse than before, compatible with compression fracture, 3 mm posterior bony retropulsion. Old vertebral augmentation at T6 with about 50% loss of height, but no current edema at the T6 level. T12 fracture involves the anterior, posterior, and superior margins of the vertebral body and has 5-6 mm of posterior bony retropulsion. Remote prior L1 compression fracture with vertebral augmentation in 2-3 mm posterior bony retropulsion which appears chronic. There is some chronic anterior wedging of T1. Despite efforts by the technologist and patient, motion artifact is present on today's exam and could not be eliminated. This reduces exam sensitivity and specificity. Cord: Subject to loss of sensitivity due to motion artifact, no significant abnormal cord signal is identified. Paraspinal and other soft tissues: A fluid signal intensity lesion of the left kidney is partially characterized on today's exam. This is statistically likely to be a cyst but not technically specific. Bilateral perirenal stranding. Trace pleural fluid bilaterally. There is likely dependent atelectasis in both lungs. 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 T2-3:  Grossly no impingement. T3-4: Grossly no impingement. T4-5: No impingement associated with the posterior retropulsion from T5. T5-6:  Questionable mild left foraminal stenosis due to spurring. T6-7:  No impingement identified.  Mild disc bulge. T7- 8:  Unremarkable. T8- 9:  Unremarkable. T9-10: No impingement. Mild left eccentric disc bulge and facet arthropathy. T10-11: Borderline central narrowing of the thecal sac due to disc bulge. T11-12: Low T1 and T2 signal in  the left lateral recess tracking cephalad from this level potentially representing disc material or less likely epidural blood products associated with the T12 compression. This causes mild left eccentric central narrowing of the thecal sac at the T12 level there is an overall mild to moderate degree of central narrowing of the thecal sac due to the posterior bony retropulsion and possible epidural blood products along the posterior margin of the vertebral body. No definite cord edema. T12-L1: No impingement. Minimal disc bulge and slight posterior bony retropulsion from the remote L1 compression. L1-2:  No impingement, diffuse disc bulge. L2-3:  Poorly seen due to the hardware, no definite impingement. IMPRESSION: 1. Mild-to-moderate central narrowing of the thecal sac primarily at the T12 level due to posterior retropulsion from the T12 vertebral body potentially with some epidural blood products tracking along this level. This is not causing a critical degree of stenosis currently, and we do not see any definite abnormal cord edema signal. Sensitivity mildly adversely affected by the degree of motion artifact on very sequences.  This fracture has components extending transversely through the vertebral body to the anterior and posterior surfaces, as well as to the superior endplate. The 2. Essentially vertebra plana at T5 due to compression fracture with subacute component. All compression fractures at T6 and L1 with vertebral augmentations at those levels. Mild kyphotic deformity at T5. 3. Questionable mild left foraminal stenosis at T5-6 due to spurring. 4. Trace bilateral pleural effusions with dependent atelectasis in the lungs. Electronically Signed   By: Gaylyn Rong M.D.   On: 02/05/2016 14:09   Dg C-arm 61-120 Min  Result Date: 02/08/2016 CLINICAL DATA:  Operative kyphoplasty. EXAM: THORACIC SPINE 2 VIEWS ; DG C-ARM 61-120 MIN COMPARISON:  02/04/2016 plain film and CT FINDINGS: Remote posterior  fusion including L2. Remote kyphoplasty at L1. Interval kyphoplasty performed at T12. IMPRESSION: Interval kyphoplasty at T12. Electronically Signed   By: Norva Pavlov M.D.   On: 02/08/2016 09:52    Assessment/Plan:   1. Thoracic compression fracture, with routine healing, subsequent encounter Continue physical therapy at home. Follow up with pain clinic for further management of pain. Follow up with PCP as needed.     Patient is being discharged with the following home health services:  HHPT/OT  Patient is being discharged with the following durable medical equipment: walker   Patient has been advised to f/u with their PCP in 1-2 weeks to bring them up to date on their rehab stay.  Social services at facility was responsible for arranging this appointment.  Pt was provided with a 30 day supply of prescriptions for medications and refills must be obtained from their PCP.  For controlled substances, a more limited supply may be provided adequate until PCP appointment only.  Future labs/tests needed:  none  Family/ staff Communication:   Total Time: 30 minutes  Documentation: 15 minutes  Face to Face: 15 minutes  Family/Phone:  Brynda Rim, NP-C [facility] [address], [place] [address] Cell Phone (Mon-Fri 8am-5pm):  [phone] On Call:  [phone] & follow prompts after 5pm & weekends Office Phone:  [phone] Office Fax:  [phone]

## 2016-06-17 ENCOUNTER — Other Ambulatory Visit: Payer: Self-pay | Admitting: Internal Medicine

## 2016-06-17 DIAGNOSIS — Z1231 Encounter for screening mammogram for malignant neoplasm of breast: Secondary | ICD-10-CM

## 2016-06-27 ENCOUNTER — Ambulatory Visit
Admission: RE | Admit: 2016-06-27 | Discharge: 2016-06-27 | Disposition: A | Discharge status: Home / Self Care | Payer: Medicare Other | Source: Ambulatory Visit | Attending: Internal Medicine | Admitting: Internal Medicine

## 2016-06-27 DIAGNOSIS — Z1231 Encounter for screening mammogram for malignant neoplasm of breast: Secondary | ICD-10-CM | POA: Diagnosis not present

## 2016-09-25 ENCOUNTER — Other Ambulatory Visit: Payer: Self-pay | Admitting: Orthopedic Surgery

## 2016-09-25 DIAGNOSIS — S32050A Wedge compression fracture of fifth lumbar vertebra, initial encounter for closed fracture: Secondary | ICD-10-CM

## 2016-09-27 ENCOUNTER — Encounter
Admission: RE | Admit: 2016-09-27 | Discharge: 2016-09-27 | Disposition: A | Discharge status: Home / Self Care | Payer: Medicare Other | Source: Ambulatory Visit | Attending: Orthopedic Surgery | Admitting: Orthopedic Surgery

## 2016-09-27 ENCOUNTER — Ambulatory Visit
Admission: RE | Admit: 2016-09-27 | Discharge: 2016-09-27 | Disposition: A | Discharge status: Home / Self Care | Payer: Medicare Other | Source: Ambulatory Visit | Attending: Orthopedic Surgery | Admitting: Orthopedic Surgery

## 2016-09-27 DIAGNOSIS — Z981 Arthrodesis status: Secondary | ICD-10-CM | POA: Insufficient documentation

## 2016-09-27 DIAGNOSIS — S32050A Wedge compression fracture of fifth lumbar vertebra, initial encounter for closed fracture: Secondary | ICD-10-CM | POA: Insufficient documentation

## 2016-09-27 DIAGNOSIS — W19XXXA Unspecified fall, initial encounter: Secondary | ICD-10-CM

## 2016-09-27 DIAGNOSIS — X58XXXA Exposure to other specified factors, initial encounter: Secondary | ICD-10-CM | POA: Insufficient documentation

## 2016-09-27 HISTORY — DX: Unspecified fall, initial encounter: W19.XXXA

## 2016-09-27 MED ORDER — TECHNETIUM TC 99M MEDRONATE IV KIT
25.0000 | PACK | Freq: Once | INTRAVENOUS | Status: AC | PRN
  Administered 2016-09-27: 22.014 via INTRAVENOUS

## 2016-10-01 ENCOUNTER — Ambulatory Visit: Payer: Medicare Other | Admitting: Anesthesiology

## 2016-10-01 ENCOUNTER — Encounter
Admission: RE | Disposition: A | Discharge status: Home / Self Care | Payer: Self-pay | Source: Ambulatory Visit | Attending: Orthopedic Surgery

## 2016-10-01 ENCOUNTER — Ambulatory Visit: Payer: Medicare Other

## 2016-10-01 ENCOUNTER — Encounter: Payer: Self-pay | Admitting: *Deleted

## 2016-10-01 ENCOUNTER — Ambulatory Visit
Admission: RE | Admit: 2016-10-01 | Discharge: 2016-10-01 | Disposition: A | Discharge status: Home / Self Care | Payer: Medicare Other | Source: Ambulatory Visit | Attending: Orthopedic Surgery | Admitting: Orthopedic Surgery

## 2016-10-01 DIAGNOSIS — Z79891 Long term (current) use of opiate analgesic: Secondary | ICD-10-CM | POA: Insufficient documentation

## 2016-10-01 DIAGNOSIS — K219 Gastro-esophageal reflux disease without esophagitis: Secondary | ICD-10-CM | POA: Diagnosis not present

## 2016-10-01 DIAGNOSIS — M069 Rheumatoid arthritis, unspecified: Secondary | ICD-10-CM | POA: Diagnosis not present

## 2016-10-01 DIAGNOSIS — S32050A Wedge compression fracture of fifth lumbar vertebra, initial encounter for closed fracture: Secondary | ICD-10-CM

## 2016-10-01 DIAGNOSIS — J449 Chronic obstructive pulmonary disease, unspecified: Secondary | ICD-10-CM | POA: Diagnosis not present

## 2016-10-01 DIAGNOSIS — M4856XA Collapsed vertebra, not elsewhere classified, lumbar region, initial encounter for fracture: Secondary | ICD-10-CM | POA: Diagnosis not present

## 2016-10-01 DIAGNOSIS — Z87891 Personal history of nicotine dependence: Secondary | ICD-10-CM | POA: Insufficient documentation

## 2016-10-01 DIAGNOSIS — Z7951 Long term (current) use of inhaled steroids: Secondary | ICD-10-CM | POA: Insufficient documentation

## 2016-10-01 DIAGNOSIS — F329 Major depressive disorder, single episode, unspecified: Secondary | ICD-10-CM | POA: Diagnosis not present

## 2016-10-01 DIAGNOSIS — Z9981 Dependence on supplemental oxygen: Secondary | ICD-10-CM | POA: Insufficient documentation

## 2016-10-01 DIAGNOSIS — Z79899 Other long term (current) drug therapy: Secondary | ICD-10-CM | POA: Diagnosis not present

## 2016-10-01 DIAGNOSIS — G8929 Other chronic pain: Secondary | ICD-10-CM | POA: Insufficient documentation

## 2016-10-01 DIAGNOSIS — I10 Essential (primary) hypertension: Secondary | ICD-10-CM | POA: Insufficient documentation

## 2016-10-01 DIAGNOSIS — F419 Anxiety disorder, unspecified: Secondary | ICD-10-CM | POA: Diagnosis not present

## 2016-10-01 DIAGNOSIS — Z7902 Long term (current) use of antithrombotics/antiplatelets: Secondary | ICD-10-CM | POA: Insufficient documentation

## 2016-10-01 HISTORY — PX: KYPHOPLASTY: SHX5884

## 2016-10-01 SURGERY — KYPHOPLASTY
Anesthesia: General

## 2016-10-01 MED ORDER — METOCLOPRAMIDE HCL 10 MG PO TABS: 5.0000 mg | ORAL_TABLET | Freq: Three times a day (TID) | ORAL | Status: DC | PRN

## 2016-10-01 MED ORDER — ONDANSETRON HCL 4 MG PO TABS: 4.0000 mg | ORAL_TABLET | Freq: Four times a day (QID) | ORAL | Status: DC | PRN

## 2016-10-01 MED ORDER — ONDANSETRON HCL 4 MG/2ML IJ SOLN: 4.0000 mg | Freq: Four times a day (QID) | INTRAMUSCULAR | Status: DC | PRN

## 2016-10-01 MED ORDER — LIDOCAINE HCL (PF) 1 % IJ SOLN
INTRAMUSCULAR | Status: AC
  Filled 2016-10-01: qty 60

## 2016-10-01 MED ORDER — FENTANYL CITRATE (PF) 100 MCG/2ML IJ SOLN
INTRAMUSCULAR | Status: AC
  Filled 2016-10-01: qty 2

## 2016-10-01 MED ORDER — PROPOFOL 10 MG/ML IV BOLUS
INTRAVENOUS | Status: DC | PRN
  Administered 2016-10-01: 20 mg via INTRAVENOUS

## 2016-10-01 MED ORDER — HYDROCODONE-ACETAMINOPHEN 5-325 MG PO TABS: 1.0000 | ORAL_TABLET | Freq: Four times a day (QID) | ORAL | 0 refills | Status: DC | PRN

## 2016-10-01 MED ORDER — IOPAMIDOL (ISOVUE-M 200) INJECTION 41%
INTRAMUSCULAR | Status: AC
  Filled 2016-10-01: qty 20

## 2016-10-01 MED ORDER — MIDAZOLAM HCL 2 MG/2ML IJ SOLN
INTRAMUSCULAR | Status: DC | PRN
  Administered 2016-10-01: 1 mg via INTRAVENOUS

## 2016-10-01 MED ORDER — ONDANSETRON HCL 4 MG/2ML IJ SOLN: 4.0000 mg | Freq: Once | INTRAMUSCULAR | Status: DC | PRN

## 2016-10-01 MED ORDER — METOCLOPRAMIDE HCL 5 MG/ML IJ SOLN: 5.0000 mg | Freq: Three times a day (TID) | INTRAMUSCULAR | Status: DC | PRN

## 2016-10-01 MED ORDER — BUPIVACAINE-EPINEPHRINE (PF) 0.5% -1:200000 IJ SOLN
INTRAMUSCULAR | Status: AC
  Filled 2016-10-01: qty 30

## 2016-10-01 MED ORDER — PROPOFOL 500 MG/50ML IV EMUL
INTRAVENOUS | Status: DC | PRN
  Administered 2016-10-01: 50 ug/kg/min via INTRAVENOUS

## 2016-10-01 MED ORDER — CEFAZOLIN SODIUM-DEXTROSE 2-4 GM/100ML-% IV SOLN
2.0000 g | Freq: Once | INTRAVENOUS | Status: AC
  Administered 2016-10-01: 2 g via INTRAVENOUS

## 2016-10-01 MED ORDER — CHLORHEXIDINE GLUCONATE CLOTH 2 % EX PADS: 6.0000 | MEDICATED_PAD | Freq: Every day | CUTANEOUS | Status: DC

## 2016-10-01 MED ORDER — FENTANYL CITRATE (PF) 100 MCG/2ML IJ SOLN
INTRAMUSCULAR | Status: DC | PRN
  Administered 2016-10-01 (×2): 25 ug via INTRAVENOUS

## 2016-10-01 MED ORDER — LIDOCAINE HCL (PF) 2 % IJ SOLN
INTRAMUSCULAR | Status: AC
  Filled 2016-10-01: qty 2

## 2016-10-01 MED ORDER — SODIUM CHLORIDE 0.9 % IV SOLN: INTRAVENOUS | Status: DC

## 2016-10-01 MED ORDER — LIDOCAINE HCL (CARDIAC) 20 MG/ML IV SOLN
INTRAVENOUS | Status: DC | PRN
  Administered 2016-10-01: 60 mg via INTRAVENOUS

## 2016-10-01 MED ORDER — BUPIVACAINE-EPINEPHRINE (PF) 0.5% -1:200000 IJ SOLN
INTRAMUSCULAR | Status: DC | PRN
  Administered 2016-10-01: 15 mL

## 2016-10-01 MED ORDER — MIDAZOLAM HCL 2 MG/2ML IJ SOLN
INTRAMUSCULAR | Status: AC
  Filled 2016-10-01: qty 2

## 2016-10-01 MED ORDER — LACTATED RINGERS IV SOLN
INTRAVENOUS | Status: DC
  Administered 2016-10-01: 12:00:00 via INTRAVENOUS

## 2016-10-01 MED ORDER — CEFAZOLIN SODIUM-DEXTROSE 2-4 GM/100ML-% IV SOLN
INTRAVENOUS | Status: AC
  Filled 2016-10-01: qty 100

## 2016-10-01 MED ORDER — LIDOCAINE HCL 1 % IJ SOLN
INTRAMUSCULAR | Status: DC | PRN
  Administered 2016-10-01: 25 mL

## 2016-10-01 MED ORDER — FENTANYL CITRATE (PF) 100 MCG/2ML IJ SOLN: 25.0000 ug | INTRAMUSCULAR | Status: DC | PRN

## 2016-10-01 MED ORDER — HYDROCODONE-ACETAMINOPHEN 5-325 MG PO TABS: 1.0000 | ORAL_TABLET | ORAL | Status: DC | PRN

## 2016-10-01 MED ORDER — PROPOFOL 500 MG/50ML IV EMUL
INTRAVENOUS | Status: AC
Start: 2016-10-01 — End: ?
  Filled 2016-10-01: qty 50

## 2016-10-01 SURGICAL SUPPLY — 19 items
CEMENT KYPHON CX01A KIT/MIXER (Cement) ×3 IMPLANT
DECANTER SPIKE VIAL GLASS SM (MISCELLANEOUS) ×6 IMPLANT
DEVICE BIOPSY BONE KYPHX (INSTRUMENTS) ×3 IMPLANT
DRAPE C-ARM XRAY 36X54 (DRAPES) ×3 IMPLANT
DURAPREP 26ML APPLICATOR (WOUND CARE) ×3 IMPLANT
GLOVE BIO SURGEON STRL SZ7.5 (GLOVE) ×3 IMPLANT
GLOVE BIOGEL PI IND STRL 7.5 (GLOVE) ×1 IMPLANT
GLOVE BIOGEL PI INDICATOR 7.5 (GLOVE) ×2
GLOVE SURG SYN 9.0  PF PI (GLOVE) ×2
GLOVE SURG SYN 9.0 PF PI (GLOVE) ×1 IMPLANT
GOWN SRG 2XL LVL 4 RGLN SLV (GOWNS) ×1 IMPLANT
GOWN STRL NON-REIN 2XL LVL4 (GOWNS) ×2
GOWN STRL REUS W/ TWL LRG LVL3 (GOWN DISPOSABLE) ×1 IMPLANT
GOWN STRL REUS W/TWL LRG LVL3 (GOWN DISPOSABLE) ×2
LIQUID BAND (GAUZE/BANDAGES/DRESSINGS) ×3 IMPLANT
PACK KYPHOPLASTY (MISCELLANEOUS) ×3 IMPLANT
STRAP SAFETY BODY (MISCELLANEOUS) ×3 IMPLANT
TRAY KYPHOPAK 15/3 EXPRESS 1ST (MISCELLANEOUS) ×3 IMPLANT
TRAY KYPHOPAK 20/3 EXPRESS 1ST (MISCELLANEOUS) IMPLANT

## 2016-10-01 NOTE — H&P (Signed)
Reviewed paper H+P, will be scanned into chart. Patient examined No changes noted.  

## 2016-10-01 NOTE — Transfer of Care (Signed)
Immediate Anesthesia Transfer of Care Note  Patient: Michelle Cabrera  Procedure(s) Performed: Procedure(s): KYPHOPLASTY  L-5 (N/A)  Patient Location: PACU  Anesthesia Type:General  Level of Consciousness: sedated  Airway & Oxygen Therapy: Patient Spontanous Breathing and Patient connected to face mask oxygen  Post-op Assessment: Report given to RN and Post -op Vital signs reviewed and stable  Post vital signs: Reviewed and stable  Last Vitals:  Vitals:   10/01/16 1110 10/01/16 1316  BP: (!) 147/61 112/59  Pulse: 96 88  Resp: 16 17  Temp: 36.6 C 36.8 C    Last Pain:  Vitals:   10/01/16 1316  TempSrc:   PainSc: Asleep      Patients Stated Pain Goal: 2 (10/01/16 1110)  Complications: No apparent anesthesia complications

## 2016-10-01 NOTE — Anesthesia Preprocedure Evaluation (Signed)
Anesthesia Evaluation  Patient identified by MRN, date of birth, ID band Patient awake    Reviewed: Allergy & Precautions, H&P , NPO status , Patient's Chart, lab work & pertinent test results, reviewed documented beta blocker date and time   History of Anesthesia Complications Negative for: history of anesthetic complications  Airway Mallampati: III  TM Distance: >3 FB Neck ROM: full    Dental no notable dental hx. (+) Caps, Poor Dentition   Pulmonary shortness of breath and with exertion, neg sleep apnea, COPD,  COPD inhaler and oxygen dependent, neg recent URI, former smoker,    Pulmonary exam normal breath sounds clear to auscultation       Cardiovascular Exercise Tolerance: Good hypertension, On Medications (-) angina(-) CAD, (-) Past MI, (-) Cardiac Stents and (-) CABG Normal cardiovascular exam(-) dysrhythmias (-) Valvular Problems/Murmurs Rhythm:regular Rate:Normal     Neuro/Psych PSYCHIATRIC DISORDERS (Depression and anxiety) negative neurological ROS     GI/Hepatic Neg liver ROS, GERD  ,  Endo/Other  negative endocrine ROS  Renal/GU negative Renal ROS  negative genitourinary   Musculoskeletal   Abdominal   Peds  Hematology negative hematology ROS (+)   Anesthesia Other Findings Past Medical History:   Hypertension                                                 Closed compression fracture of L1 lumbar verte* 11/2012       COPD (chronic obstructive pulmonary disease) (*              Anxiety                                                      Depression                                                   GERD (gastroesophageal reflux disease)                       Rheumatoid arthritis (HCC)                                   DDD (degenerative disc disease)                              Chronic lower back pain                                      Collagen vascular disease (HCC)                              Reproductive/Obstetrics negative OB ROS                             Anesthesia Physical  Anesthesia Plan  ASA: III  Anesthesia Plan: General   Post-op Pain Management:    Induction:   Airway Management Planned:   Additional Equipment:   Intra-op Plan:   Post-operative Plan:   Informed Consent: I have reviewed the patients History and Physical, chart, labs and discussed the procedure including the risks, benefits and alternatives for the proposed anesthesia with the patient or authorized representative who has indicated his/her understanding and acceptance.   Dental Advisory Given  Plan Discussed with: Anesthesiologist, CRNA and Surgeon  Anesthesia Plan Comments:         Anesthesia Quick Evaluation

## 2016-10-01 NOTE — Op Note (Signed)
10/01/2016  1:10 PM  PATIENT:  Michelle Cabrera  71 y.o. female  PRE-OPERATIVE DIAGNOSIS:  CLOSED WEDGE COMPRESSION FRACTURE OF FIFTH LUMBAR VERTEBRA  POST-OPERATIVE DIAGNOSIS:  closed wedgw compression fracture of L-5  PROCEDURE:  Procedure(s): KYPHOPLASTY  L-5 (N/A)  SURGEON: Laurene Footman, MD  ASSISTANTS: None  ANESTHESIA:   local and MAC  EBL:  Total I/O In: 400 [I.V.:400] Out: 3 [Blood:3]  BLOOD ADMINISTERED:none  DRAINS: none   LOCAL MEDICATIONS USED:  MARCAINE    and XYLOCAINE   SPECIMEN:  Source of Specimen:  L5 vertebral body  DISPOSITION OF SPECIMEN:  PATHOLOGY  COUNTS:  YES  TOURNIQUET:  * No tourniquets in log *  IMPLANTS: Bone cement  DICTATION: .Dragon Dictation patient was brought to the operating room and after adequate anesthesia was obtained the patient was placed prone. C-arm was brought in and good visualization of L5 was obtained in both AP and lateral projections. Patient identification and timeout procedures were completed and the skin prepped with alcohol and 5 cc 1% Xylocaine infiltrated on the left and right sides of the planned procedure. The back was then prepped and draped in usual sterile fashion and repeat timeout procedure carried out. Spinal needle was used on the right side getting down to the pedicle and avoiding the hardware at L4 and local anesthetic infiltrated down to the bone. After allowing this to set a small skin incision was made and a trocar advanced in a extrapedicular fashion. Biopsy was obtained and a cross the midline so second stick was not required. The balloon was inflated to 3 and half cc. Cement was mixed and was the appropriate consistency proximal before have cc of bone cement filled and the vertebral body well crossing the midline getting superior and inferior fill with partial correction superior endplate deformity. After the cement was set trochars removed and permanent C-arm views obtained. Dermabond was used to close  the skin followed by a Band-Aid  PLAN OF CARE: Discharge to home after PACU  PATIENT DISPOSITION:  PACU - hemodynamically stable.

## 2016-10-01 NOTE — Discharge Instructions (Addendum)
AMBULATORY SURGERY  DISCHARGE INSTRUCTIONS   1) The drugs that you were given will stay in your system until tomorrow so for the next 24 hours you should not:  A) Drive an automobile B) Make any legal decisions C) Drink any alcoholic beverage   2) You may resume regular meals tomorrow.  Today it is better to start with liquids and gradually work up to solid foods.  You may eat anything you prefer, but it is better to start with liquids, then soup and crackers, and gradually work up to solid foods.   3) Please notify your doctor immediately if you have any unusual bleeding, trouble breathing, redness and pain at the surgery site, drainage, fever, or pain not relieved by medication. 4)   5) Your post-operative visit with Dr.                                     is: Date:                        Time:    Please call to schedule your post-operative visit.  6) Additional Instructions: Take it easy today and resume normal activities tomorrow. Remove Band-Aid on Thursday, okay to shower after that      Percutaneous Vertebroplasty, Care After These instructions give you information on caring for yourself after your procedure. Your doctor may also give you more specific instructions. Call your doctor if you have any problems or questions after your procedure. Follow these instructions at home:  Take medicine as told by your doctor.  Keep your wound dry and covered for 24 hours or as told by your doctor.  Ask your doctor when you can bathe or shower.  Put an ice pack on your wound.  Put ice in a plastic bag.  Place a towel between your skin and the bag.  Leave the ice on for 15-20 minutes, 3-4 times a day.  Rest in your bed for 24 hours or as told by your doctor.  Return to normal activities as told by your doctor.  Ask your doctor what stretches and exercises you can do.  Do not bend or lift anything heavy as told by your doctor. Contact a doctor if:  Your wound  becomes red, puffy (swollen), or tender to the touch.  You are bleeding or leaking fluid from the wound.  You are sick to your stomach (nauseous) or throw up (vomit) for more than 24 hours after the procedure.  Your back pain does not get better.  You have a fever. Get help right away if:  You have bad back pain that comes on suddenly.  You cannot control when you pee (urinate) or poop (bowel movement).  You lose feeling (numbness) or have tingling in your legs or feet, or they become weak.  You have sudden weakness in your arms or legs.  You have shooting pain down your legs.  You have chest pain or a hard time breathing.  You feel dizzy or pass out (faint).  Your vision changes or you cannot talk as you normally do. This information is not intended to replace advice given to you by your health care provider. Make sure you discuss any questions you have with your health care provider. Document Released: 10/09/2009 Document Revised: 12/21/2015 Document Reviewed: 03/23/2013 Elsevier Interactive Patient Education  2017 [employer].

## 2016-10-01 NOTE — Anesthesia Post-op Follow-up Note (Cosign Needed)
Anesthesia QCDR form completed.        

## 2016-10-02 ENCOUNTER — Encounter: Payer: Self-pay | Admitting: Orthopedic Surgery

## 2016-10-02 LAB — SURGICAL PATHOLOGY

## 2016-10-04 NOTE — Anesthesia Postprocedure Evaluation (Signed)
Anesthesia Post Note  Patient: Michelle Cabrera  Procedure(s) Performed: Procedure(s) (LRB): KYPHOPLASTY  L-5 (N/A)  Patient location during evaluation: PACU Anesthesia Type: General Level of consciousness: awake and alert Pain management: pain level controlled Vital Signs Assessment: post-procedure vital signs reviewed and stable Respiratory status: spontaneous breathing, nonlabored ventilation, respiratory function stable and patient connected to nasal cannula oxygen Cardiovascular status: blood pressure returned to baseline and stable Postop Assessment: no signs of nausea or vomiting Anesthetic complications: no     Last Vitals:  Vitals:   10/01/16 1416 10/01/16 1448  BP: 136/72 (!) 114/57  Pulse: 80 89  Resp: 16   Temp: 36.7 C     Last Pain:  Vitals:   10/02/16 0846  TempSrc:   PainSc: 4                  Lenard Simmer

## 2016-10-22 ENCOUNTER — Encounter (INDEPENDENT_AMBULATORY_CARE_PROVIDER_SITE_OTHER): Payer: Medicare Other

## 2016-10-22 ENCOUNTER — Ambulatory Visit (INDEPENDENT_AMBULATORY_CARE_PROVIDER_SITE_OTHER): Payer: Medicare Other | Admitting: Vascular Surgery

## 2016-10-28 ENCOUNTER — Emergency Department: Payer: Medicare Other

## 2016-10-28 ENCOUNTER — Emergency Department
Admission: EM | Admit: 2016-10-28 | Discharge: 2016-10-28 | Disposition: A | Discharge status: Home / Self Care | Payer: Medicare Other | Attending: Emergency Medicine | Admitting: Emergency Medicine

## 2016-10-28 ENCOUNTER — Encounter: Payer: Self-pay | Admitting: *Deleted

## 2016-10-28 DIAGNOSIS — Z87891 Personal history of nicotine dependence: Secondary | ICD-10-CM | POA: Insufficient documentation

## 2016-10-28 DIAGNOSIS — J449 Chronic obstructive pulmonary disease, unspecified: Secondary | ICD-10-CM | POA: Diagnosis not present

## 2016-10-28 DIAGNOSIS — E119 Type 2 diabetes mellitus without complications: Secondary | ICD-10-CM | POA: Insufficient documentation

## 2016-10-28 DIAGNOSIS — J069 Acute upper respiratory infection, unspecified: Secondary | ICD-10-CM | POA: Diagnosis not present

## 2016-10-28 DIAGNOSIS — R05 Cough: Secondary | ICD-10-CM | POA: Diagnosis present

## 2016-10-28 DIAGNOSIS — I10 Essential (primary) hypertension: Secondary | ICD-10-CM | POA: Diagnosis not present

## 2016-10-28 DIAGNOSIS — B9789 Other viral agents as the cause of diseases classified elsewhere: Secondary | ICD-10-CM

## 2016-10-28 DIAGNOSIS — J111 Influenza due to unidentified influenza virus with other respiratory manifestations: Secondary | ICD-10-CM

## 2016-10-28 DIAGNOSIS — R69 Illness, unspecified: Secondary | ICD-10-CM

## 2016-10-28 DIAGNOSIS — Z79899 Other long term (current) drug therapy: Secondary | ICD-10-CM | POA: Diagnosis not present

## 2016-10-28 LAB — COMPREHENSIVE METABOLIC PANEL
ALBUMIN: 3 g/dL — AB (ref 3.5–5.0)
ALK PHOS: 132 U/L — AB (ref 38–126)
ALT: 19 U/L (ref 14–54)
ANION GAP: 11 (ref 5–15)
AST: 38 U/L (ref 15–41)
BILIRUBIN TOTAL: 0.5 mg/dL (ref 0.3–1.2)
BUN: 24 mg/dL — ABNORMAL HIGH (ref 6–20)
CALCIUM: 8.1 mg/dL — AB (ref 8.9–10.3)
CO2: 25 mmol/L (ref 22–32)
Chloride: 98 mmol/L — ABNORMAL LOW (ref 101–111)
Creatinine, Ser: 0.76 mg/dL (ref 0.44–1.00)
GFR calc non Af Amer: >60 mL/min (ref >60)
GLUCOSE: 110 mg/dL — AB (ref 65–99)
Potassium: 3.2 mmol/L — ABNORMAL LOW (ref 3.5–5.1)
Sodium: 134 mmol/L — ABNORMAL LOW (ref 135–145)
TOTAL PROTEIN: 7.3 g/dL (ref 6.5–8.1)

## 2016-10-28 LAB — CBC
HCT: 40 % (ref 35.0–47.0)
Hemoglobin: 13.1 g/dL (ref 12.0–16.0)
MCH: 28.4 pg (ref 26.0–34.0)
MCHC: 32.8 g/dL (ref 32.0–36.0)
MCV: 86.7 fL (ref 80.0–100.0)
Platelets: 400 10*3/uL (ref 150–440)
RBC: 4.62 MIL/uL (ref 3.80–5.20)
RDW: 18.2 % — ABNORMAL HIGH (ref 11.5–14.5)
WBC: 8.8 10*3/uL (ref 3.6–11.0)

## 2016-10-28 LAB — TROPONIN I: Troponin I: <0.03 ng/mL (ref <0.03)

## 2016-10-28 MED ORDER — ALBUTEROL SULFATE (2.5 MG/3ML) 0.083% IN NEBU
5.0000 mg | INHALATION_SOLUTION | Freq: Once | RESPIRATORY_TRACT | Status: AC
  Administered 2016-10-28: 5 mg via RESPIRATORY_TRACT
  Filled 2016-10-28: qty 6

## 2016-10-28 NOTE — ED Notes (Signed)
Pt presents with productive cough, weakness x 4 weeks; worsening in last few days, [facility]. Pt has hx of copd, and wants to catch any potential problems early. Pt alert & oriented with NAD noted.

## 2016-10-28 NOTE — ED Provider Notes (Signed)
[facility] Emergency Department Provider Note   ____________________________________________    I have reviewed the triage vital signs and the nursing notes.   HISTORY  Chief Complaint Cough     HPI Michelle Cabrera is a 71 y.o. female who was sent from urgent care for evaluation of cough. Patient with a history of COPD. She reports protocol for the last 3 weeks. She does see Dr. Meredeth Ide of pulmonology. She denies chest pain. No shortness of breath. Does report mild myalgias. POCT influenza was positive at the urgent care reportedly. No recent travel, no pleurisy.    Past Medical History:  Diagnosis Date  . Anxiety   . Chronic lower back pain   . Closed compression fracture of L1 lumbar vertebra (HCC) 11/2012  . Collagen vascular disease (HCC)   . COPD (chronic obstructive pulmonary disease) (HCC)   . DDD (degenerative disc disease)   . Depression   . Fall 09/27/2016   Fall Feb 04, 2016 with broken vertebra  . GERD (gastroesophageal reflux disease)   . Hypertension   . Rheumatoid arthritis [facility])     Patient Active Problem List   Diagnosis Date Noted  . Thoracic compression fracture (HCC) 02/04/2016  . UTI (lower urinary tract infection) 02/04/2016  . Schizophrenia spectrum disorder with psychotic disorder type not yet determined 11/13/2015  . Syncope 11/10/2015  . Hypotension 11/10/2015  . 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 Pressure ulcer 09/08/2015  . New onset type 2 diabetes mellitus (HCC) 09/06/2015  . Altered mental status 09/06/2015  . COPD exacerbation (HCC) 07/26/2015  . Arthritis associated with another disorder 09/15/2014  . Wedge compression fracture of T6 vertebra (HCC) 02/07/2014  . Intractable low back pain 12/27/2012  . DDD (degenerative disc disease) 12/27/2012  . Rheumatoid arthritis (HCC) 12/27/2012  . Hypertension 12/27/2012    Past Surgical History:  Procedure Laterality Date  . BACK SURGERY    . BREAST BIOPSY  Left    negative  . CATARACT EXTRACTION W/ INTRAOCULAR LENS  IMPLANT, BILATERAL Bilateral   . FINGER ARTHROPLASTY Right 09/15/2014   Procedure: RIGHT MIDDLE FINGER, RING FINGER, SMALL FINGER EXTENSOR DIGITORUM COMMUMIS STABILIZATION WITH RIGHT MIDDLE FINGER MCP ARTHROPLASTY, POSSIBLE RING FINGER AND SMALL FINGER MCP ARTHROPLASTY;  Surgeon: Dominica Severin, MD;  Location: [facility] OR;  Service: Orthopedics;  Laterality: Right;  . FOOT SURGERY    . HAND TENDON SURGERY Right 09/15/2014   "d/t RA"  . KYPHOPLASTY N/A 01/04/2013   Procedure: L1 Kyphoplasty;  Surgeon: Hewitt Shorts, MD;  Location: [facility] NEURO ORS;  Service: Neurosurgery;  Laterality: N/A;  L1 Kyphoplasty  . KYPHOPLASTY N/A 02/07/2014   Procedure: THORACIC SIX KYPHOPLASTY;  Surgeon: Hewitt Shorts, MD;  Location: [facility] NEURO ORS;  Service: Neurosurgery;  Laterality: N/A;  T6 Kyphoplasty   . KYPHOPLASTY N/A 02/08/2016   Procedure: KYPHOPLASTY  T-12;  Surgeon: Kennedy Bucker, MD;  Location: [facility] ORS;  Service: Orthopedics;  Laterality: N/A;  . KYPHOPLASTY N/A 10/01/2016   Procedure: KYPHOPLASTY  L-5;  Surgeon: Kennedy Bucker, MD;  Location: [facility] ORS;  Service: Orthopedics;  Laterality: N/A;  . LUMBAR DISC SURGERY      X 3  . PERIPHERAL VASCULAR CATHETERIZATION Left 12/07/2015   Procedure: Upper Extremity Angiography;  Surgeon: Annice Needy, MD;  Location: [facility] INVASIVE CV LAB;  Service: Cardiovascular;  Laterality: Left;  . PERIPHERAL VASCULAR CATHETERIZATION  12/07/2015   Procedure: Upper Extremity Intervention;  Surgeon: Annice Needy, MD;  Location: [facility] INVASIVE CV  LAB;  Service: Cardiovascular;;  . POSTERIOR LUMBAR FUSION     "got screws in"  . TONSILLECTOMY    . TUBAL LIGATION    . WRIST FUSION Right    30 yrs ago    Prior to Admission medications   Medication Sig Start Date End Date Taking? Authorizing Provider  albuterol (PROVENTIL HFA;VENTOLIN HFA) 108 (90 Base) MCG/ACT inhaler Inhale 2 puffs into the lungs every 6 (six) hours as needed for  wheezing or shortness of breath.    Historical Provider, MD  Calcium Carbonate-Vit D-Min (CALCIUM-VITAMIN D-MINERALS) 600-400 MG-UNIT CHEW Chew 1 tablet by mouth 2 (two) times daily.     Historical Provider, MD  Carboxymethylcellul-Glycerin (LUBRICATING EYE DROPS OP) Apply 1 drop to eye daily as needed (dry eyes).    Historical Provider, MD  clopidogrel (PLAVIX) 75 MG tablet Take 1 tablet (75 mg total) by mouth daily. 12/07/15   Annice Needy, MD  DULoxetine (CYMBALTA) 30 MG capsule Take 30 mg by mouth at bedtime. *Note dose*    Historical Provider, MD  DULoxetine (CYMBALTA) 60 MG capsule Take 60 mg by mouth every morning. *Note dose*    Historical Provider, MD  Etanercept 25 MG/0.5ML SOSY Inject 1 mL into the skin once a week. *Give on Friday* 10/28/15   Historical Provider, MD  FLECTOR 1.3 % PTCH Apply 1 patch topically 2 (two) times daily. 09/20/16   Historical Provider, MD  Fluticasone Furoate-Vilanterol (BREO ELLIPTA) 100-25 MCG/INH AEPB Inhale 1 puff into the lungs every morning.    Historical Provider, MD  folic acid (FOLVITE) 1 MG tablet Take 1 mg by mouth daily.    Historical Provider, MD  gabapentin (NEURONTIN) 300 MG capsule Take 600 mg by mouth at bedtime. May take an additional 300mg s 4 times daily as needed for pain    Historical Provider, MD  HYDROcodone-acetaminophen (NORCO) 5-325 MG tablet Take 1 tablet by mouth every 6 (six) hours as needed for moderate pain. 10/01/16   Kennedy Bucker, MD  ibuprofen (ADVIL,MOTRIN) 200 MG tablet Take 400 mg by mouth every 6 (six) hours as needed for mild pain. Reported on 12/07/2015    Historical Provider, MD  Methotrexate, PF, 25 MG/0.4ML SOAJ Inject 20 mg into the skin once a week. *Give on Friday* 10/28/15   Historical Provider, MD  omeprazole (PRILOSEC) 20 MG capsule Take 20 mg by mouth 2 (two) times daily.     Historical Provider, MD  oxymorphone (OPANA) 5 MG tablet Take 5 mg by mouth every 4 (four) hours as needed for pain.    Historical Provider, MD    polyethylene glycol (MIRALAX / GLYCOLAX) packet Take 17 g by mouth daily. Patient taking differently: Take 17 g by mouth daily as needed for mild constipation.  02/09/16   Katha Hamming, MD  pravastatin (PRAVACHOL) 10 MG tablet Take 10 mg by mouth at bedtime. 05/24/16 05/24/17  Historical Provider, MD  QUEtiapine (SEROQUEL) 100 MG tablet Take 1.5 tablets (150 mg total) by mouth at bedtime. 11/11/15   Ramonita Lab, MD  Trolamine Salicylate (ASPERCREME EX) Apply 1 application topically 2 (two) times daily as needed (pain).    Historical Provider, MD  vitamin C (ASCORBIC ACID) 500 MG tablet Take 500 mg by mouth daily.    Historical Provider, MD  zoledronic acid (RECLAST) 5 MG/100ML SOLN injection Inject 5 mg into the vein. Annually    Historical Provider, MD     Allergies Ambien [zolpidem tartrate]; Gold-containing drug products; Adhesive [tape]; Dilaudid [hydromorphone hcl]; and Haldol [  haloperidol]  Family History  Problem Relation Age of Onset  . CAD Father   . Alcohol abuse Father   . CAD Mother   . Hypertension Mother   . Peripheral vascular disease Mother   . Breast cancer Maternal Aunt 50  . Alcohol abuse Sister     Social History Social History  Substance Use Topics  . Smoking status: Former Smoker    Packs/day: 1.00    Years: 53.00    Types: Cigarettes    Quit date: 07/25/2015  . Smokeless tobacco: Former Neurosurgeon    Quit date: 07/15/2015  . Alcohol use No    Review of Systems  Constitutional: No fever/chills ENT: No sore throat. Cardiovascular: Denies chest pain. Respiratory: As above Gastrointestinal: No abdominal pain.  No nausea, no vomiting.    Musculoskeletal: Chronic back pain Skin: Negative for rash. Neurological: Negative for headaches or weakness  10-point ROS otherwise negative.  ____________________________________________   PHYSICAL EXAM:  VITAL SIGNS: ED Triage Vitals  Enc Vitals Group     BP 10/28/16 1621 (!) 155/71     Pulse Rate  10/28/16 1621 (!) 110     Resp 10/28/16 1621 18     Temp 10/28/16 1621 99.7 F (37.6 C)     Temp Source 10/28/16 1621 Oral     SpO2 10/28/16 1621 96 %     Weight 10/28/16 1618 130 lb (59 kg)     Height 10/28/16 1618 5\' 2"  (1.575 m)     Head Circumference --      Peak Flow --      Pain Score 10/28/16 1618 7     Pain Loc --      Pain Edu? --      Excl. in GC? --     Constitutional: Alert and oriented. No acute distress. Pleasant and interactive Eyes: Conjunctivae are normal.  Head: Atraumatic. Nose: No congestion/rhinnorhea. Mouth/Throat: Mucous membranes are moist.   Cardiovascular: Normal rate, regular rhythm. Grossly normal heart sounds.  Good peripheral circulation. Respiratory: Normal respiratory effort.  No retractions. Mild wheezing primarily left upper lobe Gastrointestinal: Soft and nontender. No distention.  No CVA tenderness. Genitourinary: deferred Musculoskeletal: No lower extremity tenderness nor edema.  Warm and well perfused Neurologic:  Normal speech and language. No gross focal neurologic deficits are appreciated.  Skin:  Skin is warm, dry and intact. No rash noted. Psychiatric: Mood and affect are normal. Speech and behavior are normal.  ____________________________________________   LABS (all labs ordered are listed, but only abnormal results are displayed)  Labs Reviewed  CBC - Abnormal; Notable for the following:       Result Value   RDW 18.2 (*)    All other components within normal limits  COMPREHENSIVE METABOLIC PANEL - Abnormal; Notable for the following:    Sodium 134 (*)    Potassium 3.2 (*)    Chloride 98 (*)    Glucose, Bld 110 (*)    BUN 24 (*)    Calcium 8.1 (*)    Albumin 3.0 (*)    Alkaline Phosphatase 132 (*)    All other components within normal limits  TROPONIN I   ____________________________________________  EKG ED ECG REPORT I, 12/28/16, the attending physician, personally viewed and interpreted this ECG.  Date:  10/28/2016  Rate: 107 Rhythm: Sinus tachycardia QRS Axis: normal Intervals: normal ST/T Wave abnormalities: Nonspecific changes Conduction Disturbances: none    ____________________________________________  RADIOLOGY  Chest x-ray unremarkable ____________________________________________   PROCEDURES  Procedure(s) performed:  No    Critical Care performed: No ____________________________________________   INITIAL IMPRESSION / ASSESSMENT AND PLAN / ED COURSE  Pertinent labs & imaging results that were available during my care of the patient were reviewed by me and considered in my medical decision making (see chart for details).  Patient presents with cough over the last 2-3 weeks. Mild wheezing on exam. Reported positive influenza A. Urgent care prescribed her Tamiflu and levofloxacin. Chest x-ray is reassuring, labs are reassuring. Patient feels better after DuoNeb. We will have her take her prescribed medications and follow up with her pulmonologist. She knows to return as needed for any shortness of breath, worsening symptoms. ____________________________________________   FINAL CLINICAL IMPRESSION(S) / ED DIAGNOSES  Final diagnoses:  Viral URI with cough  Influenza-like illness      NEW MEDICATIONS STARTED DURING THIS VISIT:  New Prescriptions   No medications on file     Note:  This document was prepared using Dragon voice recognition software and may include unintentional dictation errors.    Jene Every, MD 10/28/16 [phone]

## 2016-10-28 NOTE — ED Triage Notes (Signed)
[facility], states she was Flu A positive and has had a creamy productive sputum cough since Thursday, states general body aches, awake and alert in no acute distress

## 2016-11-26 ENCOUNTER — Other Ambulatory Visit (INDEPENDENT_AMBULATORY_CARE_PROVIDER_SITE_OTHER): Payer: Self-pay | Admitting: Vascular Surgery

## 2016-11-30 ENCOUNTER — Encounter: Payer: Self-pay | Admitting: Emergency Medicine

## 2016-11-30 ENCOUNTER — Emergency Department
Admission: EM | Admit: 2016-11-30 | Discharge: 2016-11-30 | Disposition: A | Discharge status: Home / Self Care | Payer: Medicare Other | Attending: Emergency Medicine | Admitting: Emergency Medicine

## 2016-11-30 DIAGNOSIS — Z87891 Personal history of nicotine dependence: Secondary | ICD-10-CM | POA: Diagnosis not present

## 2016-11-30 DIAGNOSIS — Z79899 Other long term (current) drug therapy: Secondary | ICD-10-CM | POA: Diagnosis not present

## 2016-11-30 DIAGNOSIS — N12 Tubulo-interstitial nephritis, not specified as acute or chronic: Secondary | ICD-10-CM

## 2016-11-30 DIAGNOSIS — R300 Dysuria: Secondary | ICD-10-CM | POA: Diagnosis present

## 2016-11-30 DIAGNOSIS — I10 Essential (primary) hypertension: Secondary | ICD-10-CM | POA: Diagnosis not present

## 2016-11-30 DIAGNOSIS — J449 Chronic obstructive pulmonary disease, unspecified: Secondary | ICD-10-CM | POA: Diagnosis not present

## 2016-11-30 LAB — URINALYSIS, COMPLETE (UACMP) WITH MICROSCOPIC
BACTERIA UA: NONE SEEN
BILIRUBIN URINE: NEGATIVE
GLUCOSE, UA: NEGATIVE mg/dL
HGB URINE DIPSTICK: NEGATIVE
KETONES UR: NEGATIVE mg/dL
Nitrite: NEGATIVE
PROTEIN: 100 mg/dL — AB
Specific Gravity, Urine: 1.025 (ref 1.005–1.030)
pH: 5 (ref 5.0–8.0)

## 2016-11-30 LAB — CBC
HEMATOCRIT: 41.9 % (ref 35.0–47.0)
Hemoglobin: 14.1 g/dL (ref 12.0–16.0)
MCH: 29.2 pg (ref 26.0–34.0)
MCHC: 33.5 g/dL (ref 32.0–36.0)
MCV: 87 fL (ref 80.0–100.0)
Platelets: 480 10*3/uL — ABNORMAL HIGH (ref 150–440)
RBC: 4.82 MIL/uL (ref 3.80–5.20)
RDW: 17.7 % — ABNORMAL HIGH (ref 11.5–14.5)
WBC: 11.7 10*3/uL — AB (ref 3.6–11.0)

## 2016-11-30 LAB — COMPREHENSIVE METABOLIC PANEL
ALT: 15 U/L (ref 14–54)
AST: 25 U/L (ref 15–41)
Albumin: 3.9 g/dL (ref 3.5–5.0)
Alkaline Phosphatase: 131 U/L — ABNORMAL HIGH (ref 38–126)
Anion gap: 11 (ref 5–15)
BUN: 25 mg/dL — ABNORMAL HIGH (ref 6–20)
CHLORIDE: 103 mmol/L (ref 101–111)
CO2: 26 mmol/L (ref 22–32)
Calcium: 9.1 mg/dL (ref 8.9–10.3)
Creatinine, Ser: 1.18 mg/dL — ABNORMAL HIGH (ref 0.44–1.00)
GFR, EST AFRICAN AMERICAN: 52 mL/min — AB (ref >60)
GFR, EST NON AFRICAN AMERICAN: 45 mL/min — AB (ref >60)
Glucose, Bld: 129 mg/dL — ABNORMAL HIGH (ref 65–99)
POTASSIUM: 4.1 mmol/L (ref 3.5–5.1)
SODIUM: 140 mmol/L (ref 135–145)
Total Bilirubin: 0.7 mg/dL (ref 0.3–1.2)
Total Protein: 8.6 g/dL — ABNORMAL HIGH (ref 6.5–8.1)

## 2016-11-30 MED ORDER — SODIUM CHLORIDE 0.9 % IV BOLUS (SEPSIS)
1000.0000 mL | Freq: Once | INTRAVENOUS | Status: AC
  Administered 2016-11-30: 1000 mL via INTRAVENOUS

## 2016-11-30 MED ORDER — CIPROFLOXACIN IN D5W 400 MG/200ML IV SOLN
400.0000 mg | Freq: Once | INTRAVENOUS | Status: AC
  Administered 2016-11-30: 400 mg via INTRAVENOUS
  Filled 2016-11-30: qty 200

## 2016-11-30 MED ORDER — CIPROFLOXACIN HCL 500 MG PO TABS: 500.0000 mg | ORAL_TABLET | Freq: Two times a day (BID) | ORAL | 0 refills | Status: AC

## 2016-11-30 NOTE — ED Triage Notes (Signed)
States has had history of urinary frequency since yesterday, states has had episodes of sweating x 2 days.

## 2016-11-30 NOTE — ED Provider Notes (Signed)
[facility] Emergency Department Provider Note  ____________________________________________   First MD Initiated Contact with Patient 11/30/16 1551     (approximate)  I have reviewed the triage vital signs and the nursing notes.   HISTORY  Chief Complaint Dysuria   HPI Michelle Cabrera is a 71 y.o. female with a history of rheumatoid arthritis on methotrexate who is presenting emergency department today with burning with urination as well as frequency and back pain over the past day. She says that she has had a decreased appetite over the past 3 days. Also with sweats twice last night. Denies any fever. Says that she has had multiple urinary tract infections in the past and has felt similarly.     Past Medical History:  Diagnosis Date  . Anxiety   . Chronic lower back pain   . Closed compression fracture of L1 lumbar vertebra (HCC) 11/2012  . Collagen vascular disease (HCC)   . COPD (chronic obstructive pulmonary disease) (HCC)   . DDD (degenerative disc disease)   . Depression   . Fall 09/27/2016   Fall Feb 04, 2016 with broken vertebra  . GERD (gastroesophageal reflux disease)   . Hypertension   . Rheumatoid arthritis [facility])     Patient Active Problem List   Diagnosis Date Noted  . Thoracic compression fracture (HCC) 02/04/2016  . UTI (lower urinary tract infection) 02/04/2016  . Schizophrenia spectrum disorder with psychotic disorder type not yet determined 11/13/2015  . Syncope 11/10/2015  . Hypotension 11/10/2015  . 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 Pressure ulcer 09/08/2015  . New onset type 2 diabetes mellitus (HCC) 09/06/2015  . Altered mental status 09/06/2015  . COPD exacerbation (HCC) 07/26/2015  . Arthritis associated with another disorder 09/15/2014  . Wedge compression fracture of T6 vertebra (HCC) 02/07/2014  . Intractable low back pain 12/27/2012  . DDD (degenerative disc disease) 12/27/2012  . Rheumatoid arthritis (HCC)  12/27/2012  . Hypertension 12/27/2012    Past Surgical History:  Procedure Laterality Date  . BACK SURGERY    . BREAST BIOPSY Left    negative  . CATARACT EXTRACTION W/ INTRAOCULAR LENS  IMPLANT, BILATERAL Bilateral   . FINGER ARTHROPLASTY Right 09/15/2014   Procedure: RIGHT MIDDLE FINGER, RING FINGER, SMALL FINGER EXTENSOR DIGITORUM COMMUMIS STABILIZATION WITH RIGHT MIDDLE FINGER MCP ARTHROPLASTY, POSSIBLE RING FINGER AND SMALL FINGER MCP ARTHROPLASTY;  Surgeon: Dominica Severin, MD;  Location: [facility] OR;  Service: Orthopedics;  Laterality: Right;  . FOOT SURGERY    . HAND TENDON SURGERY Right 09/15/2014   "d/t RA"  . KYPHOPLASTY N/A 01/04/2013   Procedure: L1 Kyphoplasty;  Surgeon: Hewitt Shorts, MD;  Location: [facility] NEURO ORS;  Service: Neurosurgery;  Laterality: N/A;  L1 Kyphoplasty  . KYPHOPLASTY N/A 02/07/2014   Procedure: THORACIC SIX KYPHOPLASTY;  Surgeon: Hewitt Shorts, MD;  Location: [facility] NEUR[facility] ORS;  Service: Neurosurgery;  Laterality: N/A;  T6 Kyphoplasty   . KYPHOPLASTY N/A 02/08/2016   Procedure: KYPHOPLASTY  T-12;  Surgeon: Kennedy Bucker, MD;  Location: [facility] ORS;  Service: Orthopedics;  Laterality: N/A;  . KYPHOPLASTY N/A 10/01/2016   Procedure: KYPHOPLASTY  L-5;  Surgeon: Kennedy Bucker, MD;  Location: [facility] ORS;  Service: Orthopedics;  Laterality: N/A;  . LUMBAR DISC SURGERY      X 3  . PERIPHERAL VASCULAR CATHETERIZATION Left 12/07/2015   Procedure: Upper Extremity Angiography;  Surgeon: Annice Needy, MD;  Location: [facility] INVASIVE CV LAB;  Service: Cardiovascular;  Laterality: Left;  . PERIPHERAL  VASCULAR CATHETERIZATION  12/07/2015   Procedure: Upper Extremity Intervention;  Surgeon: Annice Needy, MD;  Location: [facility] INVASIVE CV LAB;  Service: Cardiovascular;;  . POSTERIOR LUMBAR FUSION     "got screws in"  . TONSILLECTOMY    . TUBAL LIGATION    . WRIST FUSION Right    30 yrs ago    Prior to Admission medications   Medication Sig Start Date End Date Taking? Authorizing Provider    albuterol (PROVENTIL HFA;VENTOLIN HFA) 108 (90 Base) MCG/ACT inhaler Inhale 2 puffs into the lungs every 6 (six) hours as needed for wheezing or shortness of breath.    [provider]  Calcium Carbonate-Vit D-Min (CALCIUM-VITAMIN D-MINERALS) 600-400 MG-UNIT CHEW Chew 1 tablet by mouth 2 (two) times daily.     [provider]  Carboxymethylcellul-Glycerin (LUBRICATING EYE DROPS OP) Apply 1 drop to eye daily as needed (dry eyes).    [provider]  clopidogrel (PLAVIX) 75 MG tablet TAKE 1 TABLET BY MOUTH ONCE DAILY. 11/26/16   Annice Needy, MD  DULoxetine (CYMBALTA) 30 MG capsule Take 30 mg by mouth at bedtime. *Note dose*    [provider]  DULoxetine (CYMBALTA) 60 MG capsule Take 60 mg by mouth every morning. *Note dose*    [provider]  Etanercept 25 MG/0.5ML SOSY Inject 1 mL into the skin once a week. *Give on Friday* 10/28/15   [provider]  FLECTOR 1.3 % PTCH Apply 1 patch topically 2 (two) times daily. 09/20/16   [provider]  Fluticasone Furoate-Vilanterol (BREO ELLIPTA) 100-25 MCG/INH AEPB Inhale 1 puff into the lungs every morning.    [provider]  folic acid (FOLVITE) 1 MG tablet Take 1 mg by mouth daily.    [provider]  gabapentin (NEURONTIN) 300 MG capsule Take 600 mg by mouth at bedtime. May take an additional 300mg s 4 times daily as needed for pain    [provider]  HYDROcodone-acetaminophen (NORCO) 5-325 MG tablet Take 1 tablet by mouth every 6 (six) hours as needed for moderate pain. 10/01/16   12/01/16, MD  ibuprofen (ADVIL,MOTRIN) 200 MG tablet Take 400 mg by mouth every 6 (six) hours as needed for mild pain. Reported on 12/07/2015    [provider]  Methotrexate, PF, 25 MG/0.4ML SOAJ Inject 20 mg into the skin once a week. *Give on Friday* 10/28/15   [provider]  omeprazole (PRILOSEC) 20 MG capsule Take 20 mg by mouth 2 (two) times daily.     [provider]  oxymorphone (OPANA) 5 MG tablet Take 5 mg by mouth every 4 (four) hours as needed for pain.    [provider]  polyethylene glycol (MIRALAX / GLYCOLAX) packet Take 17 g by mouth daily. Patient taking differently: Take 17 g by mouth daily as needed for mild constipation.  02/09/16   02/11/16, MD  pravastatin (PRAVACHOL) 10 MG tablet Take 10 mg by mouth at bedtime. 05/24/16 05/24/17  [provider]  QUEtiapine (SEROQUEL) 100 MG tablet Take 1.5 tablets (150 mg total) by mouth at bedtime. 11/11/15   11/13/15, MD  Trolamine Salicylate (ASPERCREME EX) Apply 1 application topically 2 (two) times daily as needed (pain).    [provider]  vitamin C (ASCORBIC ACID) 500 MG tablet Take 500 mg by mouth daily.    [provider]  zoledronic acid (RECLAST) 5 MG/100ML SOLN injection Inject 5 mg into the vein. Annually    [provider]    Allergies Ambien [zolpidem tartrate]; Gold-containing drug products; Adhesive [tape]; Dilaudid [hydromorphone hcl]; and Haldol [haloperidol]  Family History  Problem Relation Age of Onset  . CAD Father   . Alcohol abuse Father   . CAD Mother   . Hypertension Mother   . Peripheral vascular disease Mother   . Breast cancer Maternal Aunt 50  . Alcohol abuse Sister     Social History Social History  Substance Use Topics  . Smoking status: Former Smoker    Packs/day: 1.00    Years: 53.00    Types: Cigarettes    Quit date: 07/25/2015  . Smokeless tobacco: Former Neurosurgeon    Quit date: 07/15/2015  . Alcohol use No    Review of Systems  Constitutional: No fever/chills Eyes: No visual changes. ENT: No sore throat. Cardiovascular: Denies chest pain. Respiratory: Denies shortness of breath. Gastrointestinal: No abdominal pain.  No nausea, no vomiting.  No diarrhea.  No constipation. Genitourinary: as above Musculoskeletal: as above Skin: Negative for rash. Neurological: Negative for  headaches, focal weakness or numbness.   ____________________________________________   PHYSICAL EXAM:  VITAL SIGNS: ED Triage Vitals  Enc Vitals Group     BP 11/30/16 1308 (!) 157/88     Pulse Rate 11/30/16 1308 (!) 110     Resp 11/30/16 1308 20     Temp 11/30/16 1308 97.6 F (36.4 C)     Temp Source 11/30/16 1308 Oral     SpO2 11/30/16 1308 95 %     Weight 11/30/16 1310 135 lb (61.2 kg)     Height 11/30/16 1310 5\' 2"  (1.575 m)     Head Circumference --      Peak Flow --      Pain Score 11/30/16 1307 7     Pain Loc --      Pain Edu? --      Excl. in GC? --     Constitutional: Alert and oriented. Well appearing and in no acute distress. Eyes: Conjunctivae are normal. PERRL. EOMI. Head: Atraumatic. Nose: No congestion/rhinnorhea. Mouth/Throat: Mucous membranes are moist.   Neck: No stridor.   Cardiovascular: tachycardia, regular rhythm. Grossly normal heart sounds.   Respiratory: Normal respiratory effort.  No retractions. Lungs CTAB. Gastrointestinal: Soft and nontender. No distention. No abdominal bruits. No CVA tenderness. Musculoskeletal: No lower extremity tenderness nor edema.  No joint effusions. Neurologic:  Normal speech and language. No gross focal neurologic deficits are appreciated. Skin:  Skin is warm, dry and intact. No rash noted. Psychiatric: Mood and affect are normal. Speech and behavior are normal.  ____________________________________________   LABS (all labs ordered are listed, but only abnormal results are displayed)  Labs Reviewed  COMPREHENSIVE METABOLIC PANEL - Abnormal; Notable for the following:       Result Value   Glucose, Bld 129 (*)    BUN 25 (*)    Creatinine, Ser 1.18 (*)    Total Protein 8.6 (*)    Alkaline Phosphatase 131 (*)    GFR calc non Af Amer 45 (*)    GFR calc Af Amer 52 (*)    All other components within normal limits  CBC - Abnormal; Notable for the following:    WBC 11.7 (*)    RDW 17.7 (*)    Platelets 480 (*)      All other components within normal limits  URINALYSIS, COMPLETE (UACMP) WITH MICROSCOPIC - Abnormal; Notable for the following:    Color, Urine AMBER (*)  APPearance CLOUDY (*)    Protein, ur 100 (*)    Leukocytes, UA SMALL (*)    Squamous Epithelial / LPF 6-30 (*)    Non Squamous Epithelial 0-5 (*)    All other components within normal limits  URINE CULTURE   ____________________________________________  EKG   ____________________________________________  RADIOLOGY   ____________________________________________   PROCEDURES  Procedure(s) performed:   Procedures  Critical Care performed:   ____________________________________________   INITIAL IMPRESSION / ASSESSMENT AND PLAN / ED COURSE  Pertinent labs & imaging results that were available during my care of the patient were reviewed by me and considered in my medical decision making (see chart for details).  ----------------------------------------- 5:35 PM on 11/30/2016 -----------------------------------------  Patient's heart rate now 97 bpm. Slightly elevated white blood cell count. Also with an elevated creatinine. Possible dehydration and hemoconcentration from patient not eating and drinking normally over the past 3 days. She has received about 500 cc of fluids at this time. She said she would like to be discharged to home and I feel is appropriate because she is very well-appearing. We discussed admission because technically she does meet sepsis criteria. However, she says that she has had multiple presentations in the past that have been very similar and has done very well on by mouth Cipro at home. We discussed return precautions and is any worsening or concerning symptoms especially worsening pain or fever. She is understanding of this and willing to comply.  Patient's urinalysis not as dramatic as previous. However, there are white blood cells as well as leukocytes and clinically, she is having symptoms  of urinary tract infection. Therefore, I believe this to be the most likely source. Culture to be sent as well.      ____________________________________________   FINAL CLINICAL IMPRESSION(S) / ED DIAGNOSES  Pyelonephritis.    NEW MEDICATIONS STARTED DURING THIS VISIT:  New Prescriptions   No medications on file     Note:  This document was prepared using Dragon voice recognition software and may include unintentional dictation errors.    Myrna Blazer, MD 11/30/16 [phone]

## 2016-11-30 NOTE — ED Notes (Signed)
Pt verbalized understanding of discharge instructions. NAD at this time. 

## 2016-12-02 LAB — URINE CULTURE: Special Requests: NORMAL

## 2016-12-06 ENCOUNTER — Encounter (INDEPENDENT_AMBULATORY_CARE_PROVIDER_SITE_OTHER): Payer: Medicare Other

## 2016-12-06 ENCOUNTER — Ambulatory Visit (INDEPENDENT_AMBULATORY_CARE_PROVIDER_SITE_OTHER): Payer: Medicare Other | Admitting: Vascular Surgery

## 2016-12-10 ENCOUNTER — Encounter (INDEPENDENT_AMBULATORY_CARE_PROVIDER_SITE_OTHER): Payer: Self-pay | Admitting: Vascular Surgery

## 2016-12-10 ENCOUNTER — Encounter (INDEPENDENT_AMBULATORY_CARE_PROVIDER_SITE_OTHER): Payer: Self-pay

## 2016-12-10 ENCOUNTER — Ambulatory Visit (INDEPENDENT_AMBULATORY_CARE_PROVIDER_SITE_OTHER): Payer: Medicare Other | Admitting: Vascular Surgery

## 2016-12-10 ENCOUNTER — Other Ambulatory Visit (INDEPENDENT_AMBULATORY_CARE_PROVIDER_SITE_OTHER): Payer: Medicare Other

## 2016-12-10 ENCOUNTER — Other Ambulatory Visit (INDEPENDENT_AMBULATORY_CARE_PROVIDER_SITE_OTHER): Payer: Self-pay | Admitting: Vascular Surgery

## 2016-12-10 VITALS — BP 134/81 | HR 92 | Resp 16 | Wt 138.0 lb

## 2016-12-10 DIAGNOSIS — I771 Stricture of artery: Secondary | ICD-10-CM | POA: Diagnosis not present

## 2016-12-10 DIAGNOSIS — Z959 Presence of cardiac and vascular implant and graft, unspecified: Secondary | ICD-10-CM | POA: Diagnosis not present

## 2016-12-10 DIAGNOSIS — E119 Type 2 diabetes mellitus without complications: Secondary | ICD-10-CM | POA: Diagnosis not present

## 2016-12-10 DIAGNOSIS — G458 Other transient cerebral ischemic attacks and related syndromes: Secondary | ICD-10-CM

## 2016-12-10 DIAGNOSIS — I6523 Occlusion and stenosis of bilateral carotid arteries: Secondary | ICD-10-CM

## 2016-12-10 LAB — VAS US CAROTID
LCCADDIAS: -8 cm/s
LEFT ECA DIAS: -12 cm/s
LICADDIAS: -19 cm/s
LICADSYS: -80 cm/s
LICAPSYS: 102 cm/s
Left CCA dist sys: -44 cm/s
Left CCA prox dias: 7 cm/s
Left CCA prox sys: 57 cm/s
Left ICA prox dias: 21 cm/s
RCCAPSYS: 79 cm/s
RIGHT CCA MID DIAS: 9 cm/s
RIGHT ECA DIAS: -10 cm/s
Right CCA prox dias: 5 cm/s
Right cca dist sys: -48 cm/s

## 2016-12-10 NOTE — Progress Notes (Signed)
Subjective:    Patient ID: Michelle Cabrera, female    DOB: August 05, 1945, 71 y.o.   MRN: [id number] Chief Complaint  Patient presents with  . Follow-up   Patient presents for a six month carotid / left subclavian stenosis (stent placed 5/17) follow up. Seen with husband. Patient is without complaint. The patient denies experiencing Amaurosis Fugax, TIA like symptoms or focal motor deficits. She denies any left upper extremity pain, pallor or ulceration to her hand. The patient underwent a bilateral carotid duplex scan which showed no change from the previous exam on 04/03/16. Duplex is stable at 1-39% ICA stenosis bilaterally, left subclavian stent is patent.    Review of Systems  Constitutional: Negative.   HENT: Negative.   Eyes: Negative.   Respiratory: Negative.   Cardiovascular: Negative.   Gastrointestinal: Negative.   Endocrine: Negative.   Genitourinary: Negative.   Musculoskeletal: Negative.   Skin: Negative.   Allergic/Immunologic: Negative.   Neurological: Negative.   Hematological: Negative.   Psychiatric/Behavioral: Negative.       Objective:   Physical Exam  Constitutional: She is oriented to person, place, and time. She appears well-developed and well-nourished. No distress.  HENT:  Head: Normocephalic and atraumatic.  Eyes: Conjunctivae are normal. Pupils are equal, round, and reactive to light.  Neck: Normal range of motion.  No carotid bruits noted  Cardiovascular: Normal rate, regular rhythm and normal heart sounds.   Pulses:      Radial pulses are 2+ on the right side, and 2+ on the left side.  Pulmonary/Chest: Effort normal.  Musculoskeletal: Normal range of motion. She exhibits no edema.  Neurological: She is alert and oriented to person, place, and time.  Skin: Skin is warm and dry. She is not diaphoretic.  Psychiatric: She has a normal mood and affect. Her behavior is normal. Judgment and thought content normal.  Vitals reviewed.  BP 134/81   Pulse  92   Resp 16   Wt 138 lb (62.6 kg)   BMI 25.24 kg/m   Past Medical History:  Diagnosis Date  . Anxiety   . Chronic lower back pain   . Closed compression fracture of L1 lumbar vertebra (HCC) 11/2012  . Collagen vascular disease (HCC)   . COPD (chronic obstructive pulmonary disease) (HCC)   . DDD (degenerative disc disease)   . Depression   . Fall 09/27/2016   Fall Feb 04, 2016 with broken vertebra  . GERD (gastroesophageal reflux disease)   . Hypertension   . Rheumatoid arthritis [facility])    Social History   Social History  . Marital status: Married    Spouse name: N/A  . Number of children: N/A  . Years of education: N/A   Occupational History  . retired    Social History Main Topics  . Smoking status: Former Smoker    Packs/day: 1.00    Years: 53.00    Types: Cigarettes    Quit date: 07/25/2015  . Smokeless tobacco: Former Neurosurgeon    Quit date: 07/15/2015  . Alcohol use No  . Drug use: No  . Sexual activity: Yes   Other Topics Concern  . Not on file   Social History Narrative   Lives with husband at home   Past Surgical History:  Procedure Laterality Date  . BACK SURGERY    . BREAST BIOPSY Left    negative  . CATARACT EXTRACTION W/ INTRAOCULAR LENS  IMPLANT, BILATERAL Bilateral   . FINGER ARTHROPLASTY Right 09/15/2014   Procedure:  RIGHT MIDDLE FINGER, RING FINGER, SMALL FINGER EXTENSOR DIGITORUM COMMUMIS STABILIZATION WITH RIGHT MIDDLE FINGER MCP ARTHROPLASTY, POSSIBLE RING FINGER AND SMALL FINGER MCP ARTHROPLASTY;  Surgeon: Dominica Severin, MD;  Location: [facility] OR;  Service: Orthopedics;  Laterality: Right;  . FOOT SURGERY    . HAND TENDON SURGERY Right 09/15/2014   "d/t RA"  . KYPHOPLASTY N/A 01/04/2013   Procedure: L1 Kyphoplasty;  Surgeon: Hewitt Shorts, MD;  Location: [facility];  Service: Neurosurgery;  Laterality: N/A;  L1 Kyphoplasty  . KYPHOPLASTY N/A 02/07/2014   Procedure: THORACIC SIX KYPHOPLASTY;  Surgeon: Hewitt Shorts, MD;  Location: [facility] NE[facility]  [facility];  Service: Neurosurgery;  Laterality: N/A;  T6 Kyphoplasty   . KYPHOPLASTY N/A 02/08/2016   Procedure: KYPHOPLASTY  T-12;  Surgeon: Kennedy Bucker, MD;  Location: [facility] ORS;  Service: Orthopedics;  Laterality: N/A;  . KYPHOPLASTY N/A 10/01/2016   Procedure: KYPHOPLASTY  L-5;  Surgeon: Kennedy Bucker, MD;  Location: [facility] ORS;  Service: Orthopedics;  Laterality: N/A;  . LUMBAR DISC SURGERY      X 3  . PERIPHERAL VASCULAR CATHETERIZATION Left 12/07/2015   Procedure: Upper Extremity Angiography;  Surgeon: Annice Needy, MD;  Location: [facility] INVASIVE CV LAB;  Service: Cardiovascular;  Laterality: Left;  . PERIPHERAL VASCULAR CATHETERIZATION  12/07/2015   Procedure: Upper Extremity Intervention;  Surgeon: Annice Needy, MD;  Location: [facility] INVASIVE CV LAB;  Service: Cardiovascular;;  . POSTERIOR LUMBAR FUSION     "got screws in"  . TONSILLECTOMY    . TUBAL LIGATION    . WRIST FUSION Right    30 yrs ago   Family History  Problem Relation Age of Onset  . CAD Father   . Alcohol abuse Father   . CAD Mother   . Hypertension Mother   . Peripheral vascular disease Mother   . Breast cancer Maternal Aunt 50  . Alcohol abuse Sister    Allergies  Allergen Reactions  . Ambien [Zolpidem Tartrate]     Over sedation   . Gold-Containing Drug Products Anaphylaxis  . Adhesive [Tape]     Tears skin  . Dilaudid [Hydromorphone Hcl]     Made tongue swell  . Haldol [Haloperidol] Other (See Comments)    Hallucinations      Assessment & Plan:  Patient presents for a six month carotid / left subclavian stenosis (stent placed 5/17) follow up. Seen with husband. Patient is without complaint. The patient denies experiencing Amaurosis Fugax, TIA like symptoms or focal motor deficits. She denies any left upper extremity pain, pallor or ulceration to her hand. The patient underwent a bilateral carotid duplex scan which showed no change from the previous exam on 04/03/16. Duplex is stable at 1-39% ICA stenosis bilaterally,  left subclavian stent is patent.   1. Bilateral carotid artery stenosis - Stable Studies reviewed with patient. Patient asymptomatic with stable duplex. No intervention at this time. Patient to return in one year for surveillance carotid duplex. Patient to continue medical optimization with ASA and dyslipidemia medication. Patient to remain abstinent of tobacco use. I have discussed with the patient at length the risk factors for and pathogenesis of atherosclerotic disease and encouraged a healthy diet, regular exercise regimen and blood pressure / glucose control.  Patient was instructed to contact our office in the interim with problems such as arm / leg weakness or numbness, speech / swallowing difficulty or temporary monocular blindness. The patient expresses their understanding.  - VAS US CAROTID; Future  2. Subclavian arterial  stenosis (HCC) - Stable As above. Patient to call office if upper extremity pain, pallor or ulceration occurs.   - VAS [place] UPPER EXTREMITY ARTERIAL DUPLEX; Future  3. New onset type 2 diabetes mellitus (HCC) - Stable Encouraged good control as its slows the progression of atherosclerotic disease  Current Outpatient Prescriptions on File Prior to Visit  Medication Sig Dispense Refill  . albuterol (PROVENTIL HFA;VENTOLIN HFA) 108 (90 Base) MCG/ACT inhaler Inhale 2 puffs into the lungs every 6 (six) hours as needed for wheezing or shortness of breath.    . Calcium Carbonate-Vit D-Min (CALCIUM-VITAMIN D-MINERALS) 600-400 MG-UNIT CHEW Chew 1 tablet by mouth 2 (two) times daily.     . ciprofloxacin (CIPRO) 500 MG tablet Take 1 tablet (500 mg total) by mouth 2 (two) times daily. 20 tablet 0  . clopidogrel (PLAVIX) 75 MG tablet TAKE 1 TABLET BY MOUTH ONCE DAILY. 30 tablet 0  . DULoxetine (CYMBALTA) 30 MG capsule Take 30 mg by mouth at bedtime. *Note dose*    . DULoxetine (CYMBALTA) 60 MG capsule Take 60 mg by mouth every morning. *Note dose*    . Etanercept 25  MG/0.5ML SOSY Inject 1 mL into the skin once a week. *Give on Friday*    . FLECTOR 1.3 % PTCH Apply 1 patch topically 2 (two) times daily.  0  . Fluticasone Furoate-Vilanterol (BREO ELLIPTA) 100-25 MCG/INH AEPB Inhale 1 puff into the lungs every morning.    . folic acid (FOLVITE) 1 MG tablet Take 1 mg by mouth daily.    Marland Kitchen gabapentin (NEURONTIN) 300 MG capsule Take 600 mg by mouth at bedtime. May take an additional 300mg s 4 times daily as needed for pain    . ibuprofen (ADVIL,MOTRIN) 200 MG tablet Take 400 mg by mouth every 6 (six) hours as needed for mild pain. Reported on 12/07/2015    . Methotrexate, PF, 25 MG/0.4ML SOAJ Inject 20 mg into the skin once a week. *Give on Friday*    . omeprazole (PRILOSEC) 20 MG capsule Take 20 mg by mouth 2 (two) times daily.     Tuesday oxymorphone (OPANA) 5 MG tablet Take 5 mg by mouth every 4 (four) hours as needed for pain.    . polyethylene glycol (MIRALAX / GLYCOLAX) packet Take 17 g by mouth daily. (Patient taking differently: Take 17 g by mouth daily as needed for mild constipation. ) 14 each 0  . pravastatin (PRAVACHOL) 10 MG tablet Take 10 mg by mouth at bedtime.    Marland Kitchen QUEtiapine (SEROQUEL) 100 MG tablet Take 1.5 tablets (150 mg total) by mouth at bedtime. 60 tablet 0  . Trolamine Salicylate (ASPERCREME EX) Apply 1 application topically 2 (two) times daily as needed (pain).    . vitamin C (ASCORBIC ACID) 500 MG tablet Take 500 mg by mouth daily.    Marland Kitchen HYDROcodone-acetaminophen (NORCO) 5-325 MG tablet Take 1 tablet by mouth every 6 (six) hours as needed for moderate pain. (Patient not taking: Reported on 11/30/2016) 20 tablet 0   No current facility-administered medications on file prior to visit.    There are no Patient Instructions on file for this visit. No Follow-up on file.  Necia Kamm A Burt Piatek, PA-C

## 2017-02-10 DIAGNOSIS — F0390 Unspecified dementia without behavioral disturbance: Secondary | ICD-10-CM | POA: Insufficient documentation

## 2017-02-18 ENCOUNTER — Emergency Department: Payer: Medicare Other

## 2017-02-18 ENCOUNTER — Inpatient Hospital Stay
Admission: EM | Admit: 2017-02-18 | Discharge: 2017-02-22 | DRG: 481 | Disposition: A | Discharge status: Skilled Nursing Facility | Payer: Medicare Other | Attending: Specialist | Admitting: Specialist

## 2017-02-18 DIAGNOSIS — I10 Essential (primary) hypertension: Secondary | ICD-10-CM | POA: Diagnosis present

## 2017-02-18 DIAGNOSIS — E785 Hyperlipidemia, unspecified: Secondary | ICD-10-CM | POA: Diagnosis present

## 2017-02-18 DIAGNOSIS — Z7902 Long term (current) use of antithrombotics/antiplatelets: Secondary | ICD-10-CM | POA: Diagnosis not present

## 2017-02-18 DIAGNOSIS — M069 Rheumatoid arthritis, unspecified: Secondary | ICD-10-CM | POA: Diagnosis present

## 2017-02-18 DIAGNOSIS — Z87891 Personal history of nicotine dependence: Secondary | ICD-10-CM | POA: Diagnosis not present

## 2017-02-18 DIAGNOSIS — J449 Chronic obstructive pulmonary disease, unspecified: Secondary | ICD-10-CM | POA: Diagnosis present

## 2017-02-18 DIAGNOSIS — M25551 Pain in right hip: Secondary | ICD-10-CM | POA: Diagnosis present

## 2017-02-18 DIAGNOSIS — Z961 Presence of intraocular lens: Secondary | ICD-10-CM | POA: Diagnosis present

## 2017-02-18 DIAGNOSIS — E860 Dehydration: Secondary | ICD-10-CM | POA: Diagnosis present

## 2017-02-18 DIAGNOSIS — D62 Acute posthemorrhagic anemia: Secondary | ICD-10-CM | POA: Diagnosis not present

## 2017-02-18 DIAGNOSIS — Z9841 Cataract extraction status, right eye: Secondary | ICD-10-CM | POA: Diagnosis not present

## 2017-02-18 DIAGNOSIS — Y92008 Other place in unspecified non-institutional (private) residence as the place of occurrence of the external cause: Secondary | ICD-10-CM | POA: Diagnosis not present

## 2017-02-18 DIAGNOSIS — S72001A Fracture of unspecified part of neck of right femur, initial encounter for closed fracture: Secondary | ICD-10-CM

## 2017-02-18 DIAGNOSIS — K219 Gastro-esophageal reflux disease without esophagitis: Secondary | ICD-10-CM | POA: Diagnosis present

## 2017-02-18 DIAGNOSIS — F329 Major depressive disorder, single episode, unspecified: Secondary | ICD-10-CM | POA: Diagnosis present

## 2017-02-18 DIAGNOSIS — E876 Hypokalemia: Secondary | ICD-10-CM | POA: Diagnosis present

## 2017-02-18 DIAGNOSIS — W19XXXA Unspecified fall, initial encounter: Secondary | ICD-10-CM

## 2017-02-18 DIAGNOSIS — F419 Anxiety disorder, unspecified: Secondary | ICD-10-CM | POA: Diagnosis present

## 2017-02-18 DIAGNOSIS — S52021A Displaced fracture of olecranon process without intraarticular extension of right ulna, initial encounter for closed fracture: Secondary | ICD-10-CM | POA: Diagnosis present

## 2017-02-18 DIAGNOSIS — Z9842 Cataract extraction status, left eye: Secondary | ICD-10-CM

## 2017-02-18 DIAGNOSIS — Z981 Arthrodesis status: Secondary | ICD-10-CM

## 2017-02-18 DIAGNOSIS — Z419 Encounter for procedure for purposes other than remedying health state, unspecified: Secondary | ICD-10-CM

## 2017-02-18 DIAGNOSIS — W010XXA Fall on same level from slipping, tripping and stumbling without subsequent striking against object, initial encounter: Secondary | ICD-10-CM | POA: Diagnosis present

## 2017-02-18 DIAGNOSIS — S72009A Fracture of unspecified part of neck of unspecified femur, initial encounter for closed fracture: Secondary | ICD-10-CM | POA: Diagnosis present

## 2017-02-18 DIAGNOSIS — Z79899 Other long term (current) drug therapy: Secondary | ICD-10-CM | POA: Diagnosis not present

## 2017-02-18 DIAGNOSIS — S72141A Displaced intertrochanteric fracture of right femur, initial encounter for closed fracture: Secondary | ICD-10-CM | POA: Diagnosis present

## 2017-02-18 LAB — BASIC METABOLIC PANEL
Anion gap: 12 (ref 5–15)
BUN: 26 mg/dL — AB (ref 6–20)
CHLORIDE: 98 mmol/L — AB (ref 101–111)
CO2: 27 mmol/L (ref 22–32)
Calcium: 8 mg/dL — ABNORMAL LOW (ref 8.9–10.3)
Creatinine, Ser: 0.9 mg/dL (ref 0.44–1.00)
GFR calc Af Amer: >60 mL/min (ref >60)
GFR calc non Af Amer: >60 mL/min (ref >60)
GLUCOSE: 152 mg/dL — AB (ref 65–99)
POTASSIUM: 2.8 mmol/L — AB (ref 3.5–5.1)
Sodium: 137 mmol/L (ref 135–145)

## 2017-02-18 LAB — COMPREHENSIVE METABOLIC PANEL
ALT: 16 U/L (ref 14–54)
ANION GAP: 11 (ref 5–15)
AST: 30 U/L (ref 15–41)
Albumin: 2.9 g/dL — ABNORMAL LOW (ref 3.5–5.0)
Alkaline Phosphatase: 134 U/L — ABNORMAL HIGH (ref 38–126)
BUN: 27 mg/dL — ABNORMAL HIGH (ref 6–20)
CHLORIDE: 99 mmol/L — AB (ref 101–111)
CO2: 26 mmol/L (ref 22–32)
Calcium: 8.1 mg/dL — ABNORMAL LOW (ref 8.9–10.3)
Creatinine, Ser: 1.04 mg/dL — ABNORMAL HIGH (ref 0.44–1.00)
GFR calc non Af Amer: 53 mL/min — ABNORMAL LOW (ref >60)
Glucose, Bld: 245 mg/dL — ABNORMAL HIGH (ref 65–99)
Potassium: 2.8 mmol/L — ABNORMAL LOW (ref 3.5–5.1)
SODIUM: 136 mmol/L (ref 135–145)
Total Bilirubin: 0.5 mg/dL (ref 0.3–1.2)
Total Protein: 7.2 g/dL (ref 6.5–8.1)

## 2017-02-18 LAB — CBC WITH DIFFERENTIAL/PLATELET
BASOS ABS: 0.2 10*3/uL — AB (ref 0–0.1)
Basophils Relative: 1 %
EOS ABS: 0 10*3/uL (ref 0–0.7)
Eosinophils Relative: 0 %
HCT: 33.4 % — ABNORMAL LOW (ref 35.0–47.0)
Hemoglobin: 11.5 g/dL — ABNORMAL LOW (ref 12.0–16.0)
LYMPHS ABS: 2.4 10*3/uL (ref 1.0–3.6)
Lymphocytes Relative: 13 %
MCH: 29.6 pg (ref 26.0–34.0)
MCHC: 34.5 g/dL (ref 32.0–36.0)
MCV: 86 fL (ref 80.0–100.0)
Monocytes Absolute: 1.3 10*3/uL — ABNORMAL HIGH (ref 0.2–0.9)
Monocytes Relative: 7 %
NEUTROS ABS: 14.7 10*3/uL — AB (ref 1.4–6.5)
Neutrophils Relative %: 79 %
PLATELETS: 662 10*3/uL — AB (ref 150–440)
RBC: 3.89 MIL/uL (ref 3.80–5.20)
RDW: 17.3 % — ABNORMAL HIGH (ref 11.5–14.5)
WBC: 18.6 10*3/uL — ABNORMAL HIGH (ref 3.6–11.0)

## 2017-02-18 LAB — URINALYSIS, COMPLETE (UACMP) WITH MICROSCOPIC
BACTERIA UA: NONE SEEN
BILIRUBIN URINE: NEGATIVE
Glucose, UA: NEGATIVE mg/dL
Hgb urine dipstick: NEGATIVE
KETONES UR: NEGATIVE mg/dL
Leukocytes, UA: NEGATIVE
Nitrite: NEGATIVE
PH: 5 (ref 5.0–8.0)
Protein, ur: 30 mg/dL — AB
Specific Gravity, Urine: 1.027 (ref 1.005–1.030)

## 2017-02-18 LAB — CBC
HEMATOCRIT: 33.3 % — AB (ref 35.0–47.0)
Hemoglobin: 11.2 g/dL — ABNORMAL LOW (ref 12.0–16.0)
MCH: 29 pg (ref 26.0–34.0)
MCHC: 33.5 g/dL (ref 32.0–36.0)
MCV: 86.6 fL (ref 80.0–100.0)
Platelets: 666 10*3/uL — ABNORMAL HIGH (ref 150–440)
RBC: 3.85 MIL/uL (ref 3.80–5.20)
RDW: 17 % — AB (ref 11.5–14.5)
WBC: 23.8 10*3/uL — AB (ref 3.6–11.0)

## 2017-02-18 LAB — GLUCOSE, CAPILLARY
GLUCOSE-CAPILLARY: 161 mg/dL — AB (ref 65–99)
Glucose-Capillary: 191 mg/dL — ABNORMAL HIGH (ref 65–99)

## 2017-02-18 LAB — SURGICAL PCR SCREEN
MRSA, PCR: NEGATIVE
Staphylococcus aureus: NEGATIVE

## 2017-02-18 MED ORDER — ALBUTEROL SULFATE (2.5 MG/3ML) 0.083% IN NEBU: 3.0000 mL | INHALATION_SOLUTION | Freq: Four times a day (QID) | RESPIRATORY_TRACT | Status: DC | PRN

## 2017-02-18 MED ORDER — FOLIC ACID 1 MG PO TABS
1.0000 mg | ORAL_TABLET | Freq: Every day | ORAL | Status: DC
  Administered 2017-02-18 – 2017-02-22 (×4): 1 mg via ORAL
  Filled 2017-02-18 (×4): qty 1

## 2017-02-18 MED ORDER — ONDANSETRON HCL 4 MG/2ML IJ SOLN: 4.0000 mg | Freq: Four times a day (QID) | INTRAMUSCULAR | Status: DC | PRN

## 2017-02-18 MED ORDER — FLUTICASONE FUROATE-VILANTEROL 100-25 MCG/INH IN AEPB
1.0000 | INHALATION_SPRAY | Freq: Every morning | RESPIRATORY_TRACT | Status: DC
  Administered 2017-02-18 – 2017-02-22 (×4): 1 via RESPIRATORY_TRACT
  Filled 2017-02-18: qty 28

## 2017-02-18 MED ORDER — SODIUM CHLORIDE 0.9 % IV SOLN
INTRAVENOUS | Status: DC
  Administered 2017-02-18: 05:00:00 via INTRAVENOUS

## 2017-02-18 MED ORDER — MORPHINE SULFATE (PF) 4 MG/ML IV SOLN
INTRAVENOUS | Status: AC
  Filled 2017-02-18: qty 1

## 2017-02-18 MED ORDER — PANTOPRAZOLE SODIUM 40 MG PO TBEC
40.0000 mg | DELAYED_RELEASE_TABLET | Freq: Every day | ORAL | Status: DC
  Administered 2017-02-18: 40 mg via ORAL
  Filled 2017-02-18: qty 1

## 2017-02-18 MED ORDER — ONDANSETRON HCL 4 MG/2ML IJ SOLN
4.0000 mg | Freq: Once | INTRAMUSCULAR | Status: AC
  Administered 2017-02-18: 4 mg via INTRAVENOUS

## 2017-02-18 MED ORDER — GABAPENTIN 300 MG PO CAPS
600.0000 mg | ORAL_CAPSULE | Freq: Every day | ORAL | Status: DC
  Administered 2017-02-18 – 2017-02-21 (×3): 600 mg via ORAL
  Filled 2017-02-18 (×4): qty 2

## 2017-02-18 MED ORDER — SENNOSIDES-DOCUSATE SODIUM 8.6-50 MG PO TABS: 1.0000 | ORAL_TABLET | Freq: Every evening | ORAL | Status: DC | PRN

## 2017-02-18 MED ORDER — KETOROLAC TROMETHAMINE 15 MG/ML IJ SOLN: 15.0000 mg | Freq: Four times a day (QID) | INTRAMUSCULAR | Status: DC | PRN

## 2017-02-18 MED ORDER — DULOXETINE HCL 60 MG PO CPEP
60.0000 mg | ORAL_CAPSULE | ORAL | Status: DC
  Administered 2017-02-18 – 2017-02-22 (×5): 60 mg via ORAL
  Filled 2017-02-18 (×6): qty 1

## 2017-02-18 MED ORDER — ACETAMINOPHEN 650 MG RE SUPP: 650.0000 mg | Freq: Four times a day (QID) | RECTAL | Status: DC | PRN

## 2017-02-18 MED ORDER — MORPHINE SULFATE (PF) 2 MG/ML IV SOLN
2.0000 mg | INTRAVENOUS | Status: DC | PRN
  Administered 2017-02-18: 2 mg via INTRAVENOUS
  Filled 2017-02-18: qty 1

## 2017-02-18 MED ORDER — MORPHINE SULFATE ER 15 MG PO TBCR
15.0000 mg | EXTENDED_RELEASE_TABLET | Freq: Two times a day (BID) | ORAL | Status: DC
  Administered 2017-02-18 – 2017-02-22 (×7): 15 mg via ORAL
  Filled 2017-02-18 (×10): qty 1

## 2017-02-18 MED ORDER — POTASSIUM CHLORIDE 10 MEQ/100ML IV SOLN
10.0000 meq | INTRAVENOUS | Status: AC
  Administered 2017-02-18 (×3): 10 meq via INTRAVENOUS
  Filled 2017-02-18 (×4): qty 100

## 2017-02-18 MED ORDER — ONDANSETRON HCL 4 MG PO TABS: 4.0000 mg | ORAL_TABLET | Freq: Four times a day (QID) | ORAL | Status: DC | PRN

## 2017-02-18 MED ORDER — INSULIN ASPART 100 UNIT/ML ~~LOC~~ SOLN
0.0000 [IU] | Freq: Three times a day (TID) | SUBCUTANEOUS | Status: DC
  Administered 2017-02-18: 2 [IU] via SUBCUTANEOUS
  Administered 2017-02-19 (×2): 1 [IU] via SUBCUTANEOUS
  Filled 2017-02-18 (×3): qty 1

## 2017-02-18 MED ORDER — CALCIUM-VITAMIN D-MINERALS 600-400 MG-UNIT PO CHEW: 1.0000 | CHEWABLE_TABLET | Freq: Two times a day (BID) | ORAL | Status: DC

## 2017-02-18 MED ORDER — CALCIUM CARBONATE-VITAMIN D 500-200 MG-UNIT PO TABS
1.0000 | ORAL_TABLET | Freq: Two times a day (BID) | ORAL | Status: DC
  Administered 2017-02-18 – 2017-02-22 (×7): 1 via ORAL
  Filled 2017-02-18 (×8): qty 1

## 2017-02-18 MED ORDER — PRAVASTATIN SODIUM 20 MG PO TABS
10.0000 mg | ORAL_TABLET | Freq: Every day | ORAL | Status: DC
  Administered 2017-02-18 – 2017-02-21 (×3): 10 mg via ORAL
  Filled 2017-02-18 (×4): qty 1

## 2017-02-18 MED ORDER — TRAMADOL HCL 50 MG PO TABS
50.0000 mg | ORAL_TABLET | ORAL | Status: DC | PRN
  Administered 2017-02-18 – 2017-02-19 (×2): 100 mg via ORAL
  Filled 2017-02-18 (×2): qty 2

## 2017-02-18 MED ORDER — ACETAMINOPHEN 325 MG PO TABS: 650.0000 mg | ORAL_TABLET | Freq: Four times a day (QID) | ORAL | Status: DC | PRN

## 2017-02-18 MED ORDER — QUETIAPINE FUMARATE 100 MG PO TABS
150.0000 mg | ORAL_TABLET | Freq: Every day | ORAL | Status: DC
  Administered 2017-02-18 – 2017-02-21 (×3): 150 mg via ORAL
  Filled 2017-02-18: qty 6
  Filled 2017-02-18: qty 1.5
  Filled 2017-02-18: qty 6
  Filled 2017-02-18 (×2): qty 1.5
  Filled 2017-02-18: qty 6
  Filled 2017-02-18: qty 1.5
  Filled 2017-02-18: qty 6
  Filled 2017-02-18: qty 1.5

## 2017-02-18 MED ORDER — VITAMIN C 500 MG PO TABS
500.0000 mg | ORAL_TABLET | Freq: Every day | ORAL | Status: DC
  Administered 2017-02-18 – 2017-02-22 (×4): 500 mg via ORAL
  Filled 2017-02-18 (×5): qty 1

## 2017-02-18 MED ORDER — POTASSIUM CHLORIDE CRYS ER 20 MEQ PO TBCR: 40.0000 meq | EXTENDED_RELEASE_TABLET | Freq: Once | ORAL | Status: DC

## 2017-02-18 MED ORDER — MORPHINE SULFATE (PF) 4 MG/ML IV SOLN
4.0000 mg | Freq: Once | INTRAVENOUS | Status: AC
  Administered 2017-02-18: 4 mg via INTRAVENOUS

## 2017-02-18 MED ORDER — ONDANSETRON HCL 4 MG/2ML IJ SOLN
INTRAMUSCULAR | Status: AC
  Filled 2017-02-18: qty 2

## 2017-02-18 MED ORDER — POTASSIUM CHLORIDE 10 MEQ/100ML IV SOLN
INTRAVENOUS | Status: AC
  Administered 2017-02-18: 10 meq
  Filled 2017-02-18: qty 100

## 2017-02-18 MED ORDER — CEFAZOLIN SODIUM-DEXTROSE 2-4 GM/100ML-% IV SOLN
2.0000 g | INTRAVENOUS | Status: DC
  Filled 2017-02-18: qty 100

## 2017-02-18 MED ORDER — POTASSIUM CHLORIDE IN NACL 40-0.9 MEQ/L-% IV SOLN
INTRAVENOUS | Status: DC
Start: 2017-02-18 — End: 2017-02-19
  Administered 2017-02-18 – 2017-02-19 (×2): 75 mL/h via INTRAVENOUS
  Filled 2017-02-18 (×5): qty 1000

## 2017-02-18 MED ORDER — HYDROCODONE-ACETAMINOPHEN 5-325 MG PO TABS
1.0000 | ORAL_TABLET | ORAL | Status: DC | PRN
  Administered 2017-02-18: 2 via ORAL
  Administered 2017-02-18: 1 via ORAL
  Filled 2017-02-18: qty 1
  Filled 2017-02-18: qty 2

## 2017-02-18 MED ORDER — OXYCODONE HCL 5 MG PO TABS
5.0000 mg | ORAL_TABLET | ORAL | Status: DC | PRN
  Administered 2017-02-18 (×2): 5 mg via ORAL
  Administered 2017-02-18 – 2017-02-19 (×3): 10 mg via ORAL
  Filled 2017-02-18: qty 1
  Filled 2017-02-18: qty 2
  Filled 2017-02-18: qty 1
  Filled 2017-02-18 (×2): qty 2

## 2017-02-18 NOTE — Progress Notes (Signed)
Inpatient Diabetes Program Recommendations  AACE/ADA: New Consensus Statement on Inpatient Glycemic Control (2015)  Target Ranges:  Prepandial:   less than 140 mg/dL      Peak postprandial:   less than 180 mg/dL (1-2 hours)      Critically ill patients:  140 - 180 mg/dL   Lab Results  Component Value Date   GLUCAP 96 02/09/2016   HGBA1C 11.5 (H) 09/06/2015    Review of Glycemic Control- lab glucose   Results for CATHERN, TAHIR (MRN [id number]) as of 02/18/2017 12:39  Ref. Range 02/18/2017 01:40 02/18/2017 05:43 02/18/2017 05:55  Glucose Latest Ref Range: 65 - 99 mg/dL 201 (H)  007 (H)    Diabetes history: none noted but A1C was 11.5% on 09/06/15  Outpatient Diabetes medications: none Current orders for Inpatient glycemic control: none  Inpatient Diabetes Program Recommendations:  Per ADA recommendations "consider performing an A1C on all patients with diabetes or hyperglycemia admitted to the hospital if not performed in the prior 3 months".  Consider Novolog 0-9 units tid until patient is eating then Novolog 0-9 units tid , Novolog 0-5 units qhs  Diet- heart healthy/carb modified  Susette Racer, RN, [place], [place], CDE Diabetes Coordinator Inpatient Diabetes Program  [phone] (Team Pager) [phone] [facility] Office) 02/18/2017 12:42 PM

## 2017-02-18 NOTE — Clinical Social Work Placement (Signed)
   CLINICAL SOCIAL WORK PLACEMENT  NOTE  Date:  02/18/2017  Patient Details  Name: Michelle Cabrera MRN: [id number] Date of Birth: June 17, 1946  Clinical Social Work is seeking post-discharge placement for this patient at the Skilled  Nursing Facility level of care (*CSW will initial, date and re-position this form in  chart as items are completed):  Yes   Patient/family provided with [facility] Clinical Social Work Department's list of facilities offering this level of care within the geographic area requested by the patient (or if unable, by the patient's family).  Yes   Patient/family informed of their freedom to choose among providers that offer the needed level of care, that participate in Medicare, Medicaid or managed care program needed by the patient, have an available bed and are willing to accept the patient.  Yes   Patient/family informed of [facility]'s ownership interest in [facility] and [facility], as well as of the fact that they are under no obligation to receive care at these facilities.  PASRR submitted to EDS on 02/18/17     PASRR number received on       Existing PASRR number confirmed on       FL2 transmitted to all facilities in geographic area requested by pt/family on 02/18/17     FL2 transmitted to all facilities within larger geographic area on       Patient informed that his/her managed care company has contracts with or will negotiate with certain facilities, including the following:            Patient/family informed of bed offers received.  Patient chooses bed at       Physician recommends and patient chooses bed at      Patient to be transferred to   on  .  Patient to be transferred to facility by       Patient family notified on   of transfer.  Name of family member notified:        PHYSICIAN       Additional Comment:    _______________________________________________ Whalen Trompeter, Darleen Crocker, LCSW 02/18/2017, 2:49 PM

## 2017-02-18 NOTE — Progress Notes (Signed)
[facility] [place] [facility]   PATIENT NAME: Michelle Cabrera    MR#:  [id number]  DATE OF BIRTH:  18-Jun-1946  SUBJECTIVE:  CHIEF COMPLAINT:   Chief Complaint  Patient presents with  . Fall  . Hip Pain    History of recurrent UTI,  Fall, hip fracture- have hypokalemia.  REVIEW OF SYSTEMS:  CONSTITUTIONAL: No fever, fatigue or weakness.  EYES: No blurred or double vision.  EARS, NOSE, AND THROAT: No tinnitus or ear pain.  RESPIRATORY: No cough, shortness of breath, wheezing or hemoptysis.  CARDIOVASCULAR: No chest pain, orthopnea, edema.  GASTROINTESTINAL: No nausea, vomiting, diarrhea or abdominal pain.  GENITOURINARY: No dysuria, hematuria.  ENDOCRINE: No polyuria, nocturia,  HEMATOLOGY: No anemia, easy bruising or bleeding SKIN: No rash or lesion. MUSCULOSKELETAL: No joint pain or arthritis.   NEUROLOGIC: No tingling, numbness, weakness.  PSYCHIATRY: No anxiety or depression.   ROS  DRUG ALLERGIES:   Allergies  Allergen Reactions  . Ambien [Zolpidem Tartrate]     Over sedation   . Gold-Containing Drug Products Anaphylaxis  . Adhesive [Tape]     Tears skin  . Dilaudid [Hydromorphone Hcl]     Made tongue swell  . Haldol [Haloperidol] Other (See Comments)    Hallucinations    VITALS:  Blood pressure (!) 149/85, pulse 96, temperature 97.7 F (36.5 C), temperature source Oral, resp. rate 20, height 5\' 2"  (1.575 m), weight 62.2 kg (137 lb 1.6 oz), SpO2 99 %.  PHYSICAL EXAMINATION:  GENERAL:  71 y.o.-year-old patient lying in the bed with no acute distress.  EYES: Pupils equal, round, reactive to light and accommodation. No scleral icterus. Extraocular muscles intact.  HEENT: Head atraumatic, normocephalic. Oropharynx and nasopharynx clear.  NECK:  Supple, no jugular venous distention. No thyroid enlargement, no tenderness.  LUNGS: Normal breath sounds bilaterally, no wheezing, rales,rhonchi or crepitation. No use of accessory muscles of  respiration.  CARDIOVASCULAR: S1, S2 normal. No murmurs, rubs, or gallops.  ABDOMEN: Soft, nontender, nondistended. Bowel sounds present. No organomegaly or mass.  EXTREMITIES: No pedal edema, cyanosis, or clubbing.  NEUROLOGIC: Cranial nerves II through XII are intact. Muscle strength 5/5 in left side extremities except right lower have pain due to fracture.right upper limb is in sling due to fracture. Sensation intact. Gait not checked.  PSYCHIATRIC: The patient is alert and oriented x 3.  SKIN: No obvious rash, lesion, or ulcer.   Physical Exam LABORATORY PANEL:   CBC  Recent Labs Lab 02/18/17 0555  WBC 23.8*  HGB 11.2*  HCT 33.3*  PLT 666*   ------------------------------------------------------------------------------------------------------------------  Chemistries   Recent Labs Lab 02/18/17 0140 02/18/17 0555  NA 136 137  K 2.8* 2.8*  CL 99* 98*  CO2 26 27  GLUCOSE 245* 152*  BUN 27* 26*  CREATININE 1.04* 0.90  CALCIUM 8.1* 8.0*  AST 30  --   ALT 16  --   ALKPHOS 134*  --   BILITOT 0.5  --    ------------------------------------------------------------------------------------------------------------------  Cardiac Enzymes No results for input(s): TROPONINI in the last 168 hours. ------------------------------------------------------------------------------------------------------------------  RADIOLOGY:  Dg Chest 1 View  Result Date: 02/18/2017 CLINICAL DATA:  Fall going to the bathroom tonight. EXAM: CHEST 1 VIEW COMPARISON:  Radiograph 10/28/2016 FINDINGS: The cardiomediastinal contours are normal. Atherosclerosis of the thoracic aorta. Vascular stent projects over the left mediastinum. Pulmonary vasculature is normal. Mild chronic bronchial thickening and hyperinflation. No consolidation, pleural effusion, or pneumothorax. Multiple thoracic vertebral compression fractures with vertebral augmentation. Remote right  proximal humerus fracture. Bones are  diffusely under mineralized. IMPRESSION: 1. No acute abnormality. Mild chronic bronchial thickening and hyperinflation suggesting COPD. 2. Thoracic aortic atherosclerosis. 3. Multiple compression fractures in the thoracic spine, some of which with vertebral augmentation. Remote right proximal humerus fracture. Electronically Signed   By: Rubye Oaks M.D.   On: 02/18/2017 02:49   Dg Elbow Complete Right  Result Date: 02/18/2017 CLINICAL DATA:  Right elbow pain after fall going to the bathroom. EXAM: RIGHT ELBOW - COMPLETE 3+ VIEW COMPARISON:  None. FINDINGS: Displaced mid olecranon fracture. There is 11 mm distraction of posterior cortex. Articular offset of approximately 3-4 mm. There is a large joint effusion with lipohemarthrosis. Soft tissue edema posteriorly about the elbow. No additional acute fracture. IMPRESSION: Displaced mid olecranon fracture with large joint effusion. Electronically Signed   By: Rubye Oaks M.D.   On: 02/18/2017 02:46   Dg Knee 2 Views Right  Result Date: 02/18/2017 CLINICAL DATA:  All going to the bathroom, right knee pain. EXAM: RIGHT KNEE - 1-2 VIEW COMPARISON:  None. FINDINGS: Fibula is partially obscured on AP view due to overlapping osseous structures. No evidence of fracture, dislocation, or joint effusion. Diffuse bony under mineralization. Growth arrest line in the proximal tibia. Minimal tibiofemoral joint space narrowing. Alignment is maintained. Soft tissues are unremarkable. IMPRESSION: No fracture of the right knee allowing for limited visualization of the fibula. Electronically Signed   By: Rubye Oaks M.D.   On: 02/18/2017 02:45   Ct Head Wo Contrast  Result Date: 02/18/2017 CLINICAL DATA:  Fall EXAM: CT HEAD WITHOUT CONTRAST CT CERVICAL SPINE WITHOUT CONTRAST TECHNIQUE: Multidetector CT imaging of the head and cervical spine was performed following the standard protocol without intravenous contrast. Multiplanar CT image reconstructions of the  cervical spine were also generated. COMPARISON:  Head CT 11/10/2015 FINDINGS: CT HEAD FINDINGS Brain: No mass lesion, intraparenchymal hemorrhage or extra-axial collection. No evidence of acute cortical infarct. Brain parenchyma and CSF-containing spaces are normal for age. Vascular: No hyperdense vessel or unexpected calcification. Skull: Normal visualized skull base, calvarium and extracranial soft tissues. Sinuses/Orbits: No sinus fluid levels or advanced mucosal thickening. No mastoid effusion. Normal orbits. CT CERVICAL SPINE FINDINGS Alignment: No static subluxation. Facets are aligned. Occipital condyles are normally positioned. Skull base and vertebrae: No acute fracture. Soft tissues and spinal canal: No prevertebral fluid or swelling. No visible canal hematoma. Disc levels: No advanced spinal canal or neural foraminal stenosis. Upper chest: No pneumothorax, pulmonary nodule or pleural effusion. Other: Normal visualized paraspinal cervical soft tissues. IMPRESSION: 1. Normal head CT for age.  No acute intracranial abnormality. 2. No acute fracture or static subluxation of the cervical spine. Electronically Signed   By: Deatra Robinson M.D.   On: 02/18/2017 03:41   Ct Cervical Spine Wo Contrast  Result Date: 02/18/2017 CLINICAL DATA:  Fall EXAM: CT HEAD WITHOUT CONTRAST CT CERVICAL SPINE WITHOUT CONTRAST TECHNIQUE: Multidetector CT imaging of the head and cervical spine was performed following the standard protocol without intravenous contrast. Multiplanar CT image reconstructions of the cervical spine were also generated. COMPARISON:  Head CT 11/10/2015 FINDINGS: CT HEAD FINDINGS Brain: No mass lesion, intraparenchymal hemorrhage or extra-axial collection. No evidence of acute cortical infarct. Brain parenchyma and CSF-containing spaces are normal for age. Vascular: No hyperdense vessel or unexpected calcification. Skull: Normal visualized skull base, calvarium and extracranial soft tissues.  Sinuses/Orbits: No sinus fluid levels or advanced mucosal thickening. No mastoid effusion. Normal orbits. CT CERVICAL SPINE FINDINGS Alignment: No  static subluxation. Facets are aligned. Occipital condyles are normally positioned. Skull base and vertebrae: No acute fracture. Soft tissues and spinal canal: No prevertebral fluid or swelling. No visible canal hematoma. Disc levels: No advanced spinal canal or neural foraminal stenosis. Upper chest: No pneumothorax, pulmonary nodule or pleural effusion. Other: Normal visualized paraspinal cervical soft tissues. IMPRESSION: 1. Normal head CT for age.  No acute intracranial abnormality. 2. No acute fracture or static subluxation of the cervical spine. Electronically Signed   By: Deatra Robinson M.D.   On: 02/18/2017 03:41   Dg Hip Unilat  With Pelvis 2-3 Views Right  Result Date: 02/18/2017 CLINICAL DATA:  Fall tonight while going to the bathroom. Right hip pain, short and and rotated. EXAM: DG HIP (WITH OR WITHOUT PELVIS) 2-3V RIGHT COMPARISON:  None. FINDINGS: Displaced mildly comminuted intertrochanteric right hip fracture with displacement of the greater and lesser trochanteric. There is proximal migration of the femoral shaft. Femoral head remains seated in the acetabulum. Remainder the bony pelvis including pubic rami appear intact. Pubic symphysis and sacroiliac joints are congruent. Diffuse bony under mineralization. Postsurgical change in the lumbar spine and vertebral augmentation. IMPRESSION: Displaced mildly comminuted intertrochanteric right hip fracture with proximal migration of the femoral shaft. Electronically Signed   By: Rubye Oaks M.D.   On: 02/18/2017 02:43    ASSESSMENT AND PLAN:   Active Problems:   Hip fracture (HCC)  * Right hip fracture   Manage per orthopedic team.   Patient has history of COPD and carotid artery stenosis status post stent.   She has poor baseline activities due to her arthritis. Denies having chest pain any  time.   I do not see cardiac ischemic workup done in last 1 year as outpatient.   But because of her age and presence of his COPD and smoking history she is at moderate cardiovascular risk for. Postoperative complications.   No further interventions needed before surgery. We will continue monitoring.  * Hypokalemia   Replace IV and oral. Recheck tomorrow morning. Check magnesium.  * Olecranon fracture right elbow   Right arm is in sling. Followed by orthopedic team.   Pain management as needed.  * COPD   Currently no signs of exacerbation, continue home inhalers and nebulizer therapy.  * Hyperlipidemia   Continue pravastatin.   All the records are reviewed and case discussed with Care Management/Social Workerr. Management plans discussed with the patient, family and they are in agreement.  CODE STATUS: Full.  TOTAL TIME TAKING CARE OF THIS PATIENT: 35 minutes.    POSSIBLE D/C IN 1-2 DAYS, DEPENDING ON CLINICAL CONDITION.   Altamese Dilling M.D on 02/18/2017   Between 7am to 6pm - Pager - [phone]  After 6pm go to [email or web address] - Social research officer, government  [facility]  Office  [phone]  CC: Primary care physician; Lauro Regulus, MD  Note: This dictation was prepared with Dragon dictation along with smaller phrase technology. Any transcriptional errors that result from this process are unintentional.

## 2017-02-18 NOTE — Progress Notes (Signed)
Clinical Child psychotherapist (CSW) presented bed offers to patient and her husband O[place]ett. They chose [facility]	[place]. CSW explained that [facility] will have to approve SNF based on PT notes. Husband verbalized his understanding. Kim admissions coordinator at [facility] is aware of accepted bed offer. CSW will continue to follow and assist as needed.   [employer], LCSW [phone]

## 2017-02-18 NOTE — ED Triage Notes (Signed)
Pt arrived via [facility] for c/o fall when going to the bathroom - pt injured right hip and right knee when she fell - right hip is noted to be shortened and rotated

## 2017-02-18 NOTE — Consult Note (Signed)
ORTHOPAEDIC CONSULTATION  PATIENT NAME: Michelle Cabrera DOB: 07-19-1946  MRN: [id number]  REQUESTING PHYSICIAN: Altamese Dilling, *  Chief Complaint: Right hip pain and right elbow pain severe right hip pain and right elbow pain.  HPI: Michelle Cabrera is a 71 y.o. female who complains of  severe right hip pain and right elbow pain. The patient got up last night to go to the bathroom and apparently fell, landing on her right side. She was unable stand or bear weight due to the right hip pain. She also had the immediate onset of severe right elbow pain. Prior to this fall she was typically using a walker for ambulation, although she was not using the walker last night. She denied any other injuries. She denied any loss of consciousness.   Past Medical History:  Diagnosis Date  . Anxiety   . Chronic lower back pain   . Closed compression fracture of L1 lumbar vertebra (HCC) 11/2012  . Collagen vascular disease (HCC)   . COPD (chronic obstructive pulmonary disease) (HCC)   . DDD (degenerative disc disease)   . Depression   . Fall 09/27/2016   Fall Feb 04, 2016 with broken vertebra  . GERD (gastroesophageal reflux disease)   . Hypertension   . Rheumatoid arthritis [facility])    Past Surgical History:  Procedure Laterality Date  . BACK SURGERY    . BREAST BIOPSY Left    negative  . CATARACT EXTRACTION W/ INTRAOCULAR LENS  IMPLANT, BILATERAL Bilateral   . FINGER ARTHROPLASTY Right 09/15/2014   Procedure: RIGHT MIDDLE FINGER, RING FINGER, SMALL FINGER EXTENSOR DIGITORUM COMMUMIS STABILIZATION WITH RIGHT MIDDLE FINGER MCP ARTHROPLASTY, POSSIBLE RING FINGER AND SMALL FINGER MCP ARTHROPLASTY;  Surgeon: Dominica Severin, MD;  Location: [facility] OR;  Service: Orthopedics;  Laterality: Right;  . FOOT SURGERY    . HAND TENDON SURGERY Right 09/15/2014   "d/t RA"  . KYPHOPLASTY N/A 01/04/2013   Procedure: L1 Kyphoplasty;  Surgeon: Hewitt Shorts, MD;  Location: [facility] NEURO ORS;  Service: Neurosurgery;   Laterality: N/A;  L1 Kyphoplasty  . KYPHOPLASTY N/A 02/07/2014   Procedure: THORACIC SIX KYPHOPLASTY;  Surgeon: Hewitt Shorts, MD;  Location: [facility] NEURO ORS;  Service: Neurosurgery;  Laterality: N/A;  T6 Kyphoplasty   . KYPHOPLASTY N/A 02/08/2016   Procedure: KYPHOPLASTY  T-12;  Surgeon: Kennedy Bucker, MD;  Location: [facility] ORS;  Service: Orthopedics;  Laterality: N/A;  . KYPHOPLASTY N/A 10/01/2016   Procedure: KYPHOPLASTY  L-5;  Surgeon: Kennedy Bucker, MD;  Location: [facility] ORS;  Service: Orthopedics;  Laterality: N/A;  . LUMBAR DISC SURGERY      X 3  . PERIPHERAL VASCULAR CATHETERIZATION Left 12/07/2015   Procedure: Upper Extremity Angiography;  Surgeon: Annice Needy, MD;  Location: [facility] INVASIVE CV LAB;  Service: Cardiovascular;  Laterality: Left;  . PERIPHERAL VASCULAR CATHETERIZATION  12/07/2015   Procedure: Upper Extremity Intervention;  Surgeon: Annice Needy, MD;  Location: [facility] INVASIVE CV LAB;  Service: Cardiovascular;;  . POSTERIOR LUMBAR FUSION     "got screws in"  . TONSILLECTOMY    . TUBAL LIGATION    . WRIST FUSION Right    30 yrs ago   Social History   Social History  . Marital status: Married    Spouse name: N/A  . Number of children: N/A  . Years of education: N/A   Occupational History  . retired    Social History Main Topics  . Smoking status: Former Smoker    Packs/day: 1.00  Years: 53.00    Types: Cigarettes    Quit date: 07/25/2015  . Smokeless tobacco: Former Neurosurgeon    Quit date: 07/15/2015  . Alcohol use No  . Drug use: No  . Sexual activity: Yes   Other Topics Concern  . None   Social History Narrative   Lives with husband at home   Family History  Problem Relation Age of Onset  . CAD Father   . Alcohol abuse Father   . CAD Mother   . Hypertension Mother   . Peripheral vascular disease Mother   . Breast cancer Maternal Aunt 50  . Alcohol abuse Sister    Allergies  Allergen Reactions  . Ambien [Zolpidem Tartrate]     Over sedation   .  Gold-Containing Drug Products Anaphylaxis  . Adhesive [Tape]     Tears skin  . Dilaudid [Hydromorphone Hcl]     Made tongue swell  . Haldol [Haloperidol] Other (See Comments)    Hallucinations   Prior to Admission medications   Medication Sig Start Date End Date Taking? Authorizing Provider  albuterol (PROVENTIL HFA;VENTOLIN HFA) 108 (90 Base) MCG/ACT inhaler Inhale 2 puffs into the lungs every 6 (six) hours as needed for wheezing or shortness of breath.    [provider]  Calcium Carbonate-Vit D-Min (CALCIUM-VITAMIN D-MINERALS) 600-400 MG-UNIT CHEW Chew 1 tablet by mouth 2 (two) times daily.     [provider]  clopidogrel (PLAVIX) 75 MG tablet TAKE 1 TABLET BY MOUTH ONCE DAILY. 11/26/16   Annice Needy, MD  DULoxetine (CYMBALTA) 30 MG capsule Take 30 mg by mouth at bedtime. *Note dose*    [provider]  DULoxetine (CYMBALTA) 60 MG capsule Take 60 mg by mouth every morning. *Note dose*    [provider]  Etanercept 25 MG/0.5ML SOSY Inject 1 mL into the skin once a week. *Give on Friday* 10/28/15   [provider]  FLECTOR 1.3 % PTCH Apply 1 patch topically 2 (two) times daily. 09/20/16   [provider]  Fluticasone Furoate-Vilanterol (BREO ELLIPTA) 100-25 MCG/INH AEPB Inhale 1 puff into the lungs every morning.    [provider]  folic acid (FOLVITE) 1 MG tablet Take 1 mg by mouth daily.    [provider]  gabapentin (NEURONTIN) 300 MG capsule Take 600 mg by mouth at bedtime. May take an additional 300mg s 4 times daily as needed for pain    [provider]  HYDROcodone-acetaminophen (NORCO) 5-325 MG tablet Take 1 tablet by mouth every 6 (six) hours as needed for moderate pain. Patient not taking: Reported on 11/30/2016 10/01/16   Kennedy Bucker, MD  ibuprofen (ADVIL,MOTRIN) 200 MG tablet Take 400 mg by mouth every 6 (six) hours as needed for mild pain. Reported on 12/07/2015    [provider]  Methotrexate,  PF, 25 MG/0.4ML SOAJ Inject 20 mg into the skin once a week. *Give on Friday* 10/28/15   [provider]  omeprazole (PRILOSEC) 20 MG capsule Take 20 mg by mouth 2 (two) times daily.     [provider]  oxymorphone (OPANA) 5 MG tablet Take 5 mg by mouth every 4 (four) hours as needed for pain.    [provider]  polyethylene glycol (MIRALAX / GLYCOLAX) packet Take 17 g by mouth daily. Patient taking differently: Take 17 g by mouth daily as needed for mild constipation.  02/09/16   Katha Hamming, MD  pravastatin (PRAVACHOL) 10 MG tablet Take 10 mg by mouth at  bedtime. 05/24/16 05/24/17  [provider]  QUEtiapine (SEROQUEL) 100 MG tablet Take 1.5 tablets (150 mg total) by mouth at bedtime. 11/11/15   Ramonita Lab, MD  Trolamine Salicylate (ASPERCREME EX) Apply 1 application topically 2 (two) times daily as needed (pain).    [provider]  vitamin C (ASCORBIC ACID) 500 MG tablet Take 500 mg by mouth daily.    [provider]   Dg Chest 1 View  Result Date: 02/18/2017 CLINICAL DATA:  Fall going to the bathroom tonight. EXAM: CHEST 1 VIEW COMPARISON:  Radiograph 10/28/2016 FINDINGS: The cardiomediastinal contours are normal. Atherosclerosis of the thoracic aorta. Vascular stent projects over the left mediastinum. Pulmonary vasculature is normal. Mild chronic bronchial thickening and hyperinflation. No consolidation, pleural effusion, or pneumothorax. Multiple thoracic vertebral compression fractures with vertebral augmentation. Remote right proximal humerus fracture. Bones are diffusely under mineralized. IMPRESSION: 1. No acute abnormality. Mild chronic bronchial thickening and hyperinflation suggesting COPD. 2. Thoracic aortic atherosclerosis. 3. Multiple compression fractures in the thoracic spine, some of which with vertebral augmentation. Remote right proximal humerus fracture. Electronically Signed   By: Rubye Oaks M.D.   On:  02/18/2017 02:49   Dg Elbow Complete Right  Result Date: 02/18/2017 CLINICAL DATA:  Right elbow pain after fall going to the bathroom. EXAM: RIGHT ELBOW - COMPLETE 3+ VIEW COMPARISON:  None. FINDINGS: Displaced mid olecranon fracture. There is 11 mm distraction of posterior cortex. Articular offset of approximately 3-4 mm. There is a large joint effusion with lipohemarthrosis. Soft tissue edema posteriorly about the elbow. No additional acute fracture. IMPRESSION: Displaced mid olecranon fracture with large joint effusion. Electronically Signed   By: Rubye Oaks M.D.   On: 02/18/2017 02:46   Dg Knee 2 Views Right  Result Date: 02/18/2017 CLINICAL DATA:  All going to the bathroom, right knee pain. EXAM: RIGHT KNEE - 1-2 VIEW COMPARISON:  None. FINDINGS: Fibula is partially obscured on AP view due to overlapping osseous structures. No evidence of fracture, dislocation, or joint effusion. Diffuse bony under mineralization. Growth arrest line in the proximal tibia. Minimal tibiofemoral joint space narrowing. Alignment is maintained. Soft tissues are unremarkable. IMPRESSION: No fracture of the right knee allowing for limited visualization of the fibula. Electronically Signed   By: Rubye Oaks M.D.   On: 02/18/2017 02:45   Ct Head Wo Contrast  Result Date: 02/18/2017 CLINICAL DATA:  Fall EXAM: CT HEAD WITHOUT CONTRAST CT CERVICAL SPINE WITHOUT CONTRAST TECHNIQUE: Multidetector CT imaging of the head and cervical spine was performed following the standard protocol without intravenous contrast. Multiplanar CT image reconstructions of the cervical spine were also generated. COMPARISON:  Head CT 11/10/2015 FINDINGS: CT HEAD FINDINGS Brain: No mass lesion, intraparenchymal hemorrhage or extra-axial collection. No evidence of acute cortical infarct. Brain parenchyma and CSF-containing spaces are normal for age. Vascular: No hyperdense vessel or unexpected calcification. Skull: Normal visualized skull  base, calvarium and extracranial soft tissues. Sinuses/Orbits: No sinus fluid levels or advanced mucosal thickening. No mastoid effusion. Normal orbits. CT CERVICAL SPINE FINDINGS Alignment: No static subluxation. Facets are aligned. Occipital condyles are normally positioned. Skull base and vertebrae: No acute fracture. Soft tissues and spinal canal: No prevertebral fluid or swelling. No visible canal hematoma. Disc levels: No advanced spinal canal or neural foraminal stenosis. Upper chest: No pneumothorax, pulmonary nodule or pleural effusion. Other: Normal visualized paraspinal cervical soft tissues. IMPRESSION: 1. Normal head CT for age.  No acute intracranial abnormality. 2. No acute fracture or static subluxation of the  cervical spine. Electronically Signed   By: Deatra Robinson M.D.   On: 02/18/2017 03:41   Ct Cervical Spine Wo Contrast  Result Date: 02/18/2017 CLINICAL DATA:  Fall EXAM: CT HEAD WITHOUT CONTRAST CT CERVICAL SPINE WITHOUT CONTRAST TECHNIQUE: Multidetector CT imaging of the head and cervical spine was performed following the standard protocol without intravenous contrast. Multiplanar CT image reconstructions of the cervical spine were also generated. COMPARISON:  Head CT 11/10/2015 FINDINGS: CT HEAD FINDINGS Brain: No mass lesion, intraparenchymal hemorrhage or extra-axial collection. No evidence of acute cortical infarct. Brain parenchyma and CSF-containing spaces are normal for age. Vascular: No hyperdense vessel or unexpected calcification. Skull: Normal visualized skull base, calvarium and extracranial soft tissues. Sinuses/Orbits: No sinus fluid levels or advanced mucosal thickening. No mastoid effusion. Normal orbits. CT CERVICAL SPINE FINDINGS Alignment: No static subluxation. Facets are aligned. Occipital condyles are normally positioned. Skull base and vertebrae: No acute fracture. Soft tissues and spinal canal: No prevertebral fluid or swelling. No visible canal hematoma. Disc  levels: No advanced spinal canal or neural foraminal stenosis. Upper chest: No pneumothorax, pulmonary nodule or pleural effusion. Other: Normal visualized paraspinal cervical soft tissues. IMPRESSION: 1. Normal head CT for age.  No acute intracranial abnormality. 2. No acute fracture or static subluxation of the cervical spine. Electronically Signed   By: Deatra Robinson M.D.   On: 02/18/2017 03:41   Dg Hip Unilat  With Pelvis 2-3 Views Right  Result Date: 02/18/2017 CLINICAL DATA:  Fall tonight while going to the bathroom. Right hip pain, short and and rotated. EXAM: DG HIP (WITH OR WITHOUT PELVIS) 2-3V RIGHT COMPARISON:  None. FINDINGS: Displaced mildly comminuted intertrochanteric right hip fracture with displacement of the greater and lesser trochanteric. There is proximal migration of the femoral shaft. Femoral head remains seated in the acetabulum. Remainder the bony pelvis including pubic rami appear intact. Pubic symphysis and sacroiliac joints are congruent. Diffuse bony under mineralization. Postsurgical change in the lumbar spine and vertebral augmentation. IMPRESSION: Displaced mildly comminuted intertrochanteric right hip fracture with proximal migration of the femoral shaft. Electronically Signed   By: Rubye Oaks M.D.   On: 02/18/2017 02:43    Positive ROS: All other systems have been reviewed and were otherwise negative with the exception of those mentioned in the HPI and as above.  Physical Exam: General: Well developed, well nourished female seen in obvious discomfort. HEENT: Atraumatic and normocephalic. Sclera are clear. Extraocular motion is intact. Oropharynx is clear with moist mucosa. Neck: Supple, nontender, good range of motion. No JVD or carotid bruits. Lungs: Clear to auscultation bilaterally. Cardiovascular: Regular rate and rhythm with normal S1 and S2. No murmurs. No gallops or rubs. Pedal pulses are palpable bilaterally. Homans test is negative bilaterally. No  significant pretibial or ankle edema. Abdomen: Soft, nontender, and nondistended. Bowel sounds are present. Skin: No lesions in the area of chief complaint Neurologic: Awake, alert, and oriented. Sensory function is grossly intact. Motor strength is felt to be 5 over 5 bilaterally with the exception of the right upper extremity and right lower extremity that were not assessed due to the injuries. No clonus or tremor. Good motor coordination. Lymphatic: No axillary or cervical lymphadenopathy  MUSCULOSKELETAL: Examination of the right upper extremity shows a posterior splint to be intact to the elbow with the elbow flexed to approximately 90. No gross tenderness to palpation about the shoulder. Evaluation of the fingers demonstrates deformities consistent with the patient's history of rheumatoid arthritis.  Examination of the right lower  extremity shows the leg to be shortened and externally rotated. Pain is elicited with any attempt at each motion of the right hip. No gross ecchymosis or swelling about the hip or thigh. No gross knee effusion. No gross tenderness to palpation about the ankle or foot.  Assessment: Right intertrochanteric femur fracture Right olecranon fracture Hypokalemia Rheumatoid arthritis Chronic pain syndrome  Plan: The findings were discussed in detail with the patient and her husband. Recommendations are made for open reduction internal fixation of the right intertrochanteric femur fracture as well as open reduction and internal fixation of the right olecranon fracture.. The usual perioperative course was discussed. The risks and benefits of surgical intervention were reviewed. The patient expressed understanding of the risks and benefits and agreed with plans for surgical intervention.   The surgical site was signed as per the "right site surgery" protocol.   The patient is currently receiving supplemental potassium for correction of her hypokalemia. We will defer any  surgical intervention today until she has been appropriately optimized for surgery. Surgery will tentatively be scheduled for tomorrow.  Jamilet Ambroise P. Angie Fava M.D.

## 2017-02-18 NOTE — H&P (Signed)
[facility] - [place] at [facility]   PATIENT NAME: Michelle Cabrera    MR#:  [id number]  DATE OF BIRTH:  30-May-1946  DATE OF ADMISSION:  02/18/2017  PRIMARY CARE PHYSICIAN: Lauro Regulus, MD   REQUESTING/REFERRING PHYSICIAN:   CHIEF COMPLAINT:   Chief Complaint  Patient presents with  . Fall  . Hip Pain    HISTORY OF PRESENT ILLNESS: Michelle Cabrera  is a 71 y.o. female with a known history of Anxiety disorder chronic low back pain, vertebral compression fracture, collagenous vascular disease, COPD, degenerative disc disease, GERD, hypertension, rheumatoid arthritis presented to the emergency room for fall. Patient fell in her hallway around 12 midnight after she lost balance. Patient felt dizzy before she fell down and landed on her right hip and then she also hurt her right elbow when she fell. Patient has pain in the right hip and right elbow. Pain is aching in nature 8 out of 10 on a scale of 1-10. Patient was worked up with imaging studies in the emergency room which showed right hip fracture and olecranon fracture in the right elbow. Hospitalist service was consulted along with orthopedic service. Sling was put to the right arm and forearm. Patient was given IV pain medication in the emergency room.  PAST MEDICAL HISTORY:   Past Medical History:  Diagnosis Date  . Anxiety   . Chronic lower back pain   . Closed compression fracture of L1 lumbar vertebra (HCC) 11/2012  . Collagen vascular disease (HCC)   . COPD (chronic obstructive pulmonary disease) (HCC)   . DDD (degenerative disc disease)   . Depression   . Fall 09/27/2016   Fall Feb 04, 2016 with broken vertebra  . GERD (gastroesophageal reflux disease)   . Hypertension   . Rheumatoid arthritis (HCC)     PAST SURGICAL HISTORY: Past Surgical History:  Procedure Laterality Date  . BACK SURGERY    . BREAST BIOPSY Left    negative  . CATARACT EXTRACTION W/ INTRAOCULAR LENS  IMPLANT, BILATERAL  Bilateral   . FINGER ARTHROPLASTY Right 09/15/2014   Procedure: RIGHT MIDDLE FINGER, RING FINGER, SMALL FINGER EXTENSOR DIGITORUM COMMUMIS STABILIZATION WITH RIGHT MIDDLE FINGER MCP ARTHROPLASTY, POSSIBLE RING FINGER AND SMALL FINGER MCP ARTHROPLASTY;  Surgeon: Dominica Severin, MD;  Location: [facility] OR;  Service: Orthopedics;  Laterality: Right;  . FOOT SURGERY    . HAND TENDON SURGERY Right 09/15/2014   "d/t RA"  . KYPHOPLASTY N/A 01/04/2013   Procedure: L1 Kyphoplasty;  Surgeon: Hewitt Shorts, MD;  Location: [facility] NEURO ORS;  Service: Neurosurgery;  Laterality: N/A;  L1 Kyphoplasty  . KYPHOPLASTY N/A 02/07/2014   Procedure: THORACIC SIX KYPHOPLASTY;  Surgeon: Hewitt Shorts, MD;  Location: [facility] NEURO ORS;  Service: Neurosurgery;  Laterality: N/A;  T6 Kyphoplasty   . KYPHOPLASTY N/A 02/08/2016   Procedure: KYPHOPLASTY  T-12;  Surgeon: Kennedy Bucker, MD;  Location: [facility] ORS;  Service: Orthopedics;  Laterality: N/A;  . KYPHOPLASTY N/A 10/01/2016   Procedure: KYPHOPLASTY  L-5;  Surgeon: Kennedy Bucker, MD;  Location: [facility] ORS;  Service: Orthopedics;  Laterality: N/A;  . LUMBAR DISC SURGERY      X 3  . PERIPHERAL VASCULAR CATHETERIZATION Left 12/07/2015   Procedure: Upper Extremity Angiography;  Surgeon: Annice Needy, MD;  Location: [facility] INVASIVE CV LAB;  Service: Cardiovascular;  Laterality: Left;  . PERIPHERAL VASCULAR CATHETERIZATION  12/07/2015   Procedure: Upper Extremity Intervention;  Surgeon: Annice Needy, MD;  Location: [facility] INVASIVE CV  LAB;  Service: Cardiovascular;;  . POSTERIOR LUMBAR FUSION     "got screws in"  . TONSILLECTOMY    . TUBAL LIGATION    . WRIST FUSION Right    30 yrs ago    SOCIAL HISTORY:  Social History  Substance Use Topics  . Smoking status: Former Smoker    Packs/day: 1.00    Years: 53.00    Types: Cigarettes    Quit date: 07/25/2015  . Smokeless tobacco: Former Neurosurgeon    Quit date: 07/15/2015  . Alcohol use No    FAMILY HISTORY:  Family History  Problem Relation  Age of Onset  . CAD Father   . Alcohol abuse Father   . CAD Mother   . Hypertension Mother   . Peripheral vascular disease Mother   . Breast cancer Maternal Aunt 50  . Alcohol abuse Sister     DRUG ALLERGIES:  Allergies  Allergen Reactions  . Ambien [Zolpidem Tartrate]     Over sedation   . Gold-Containing Drug Products Anaphylaxis  . Adhesive [Tape]     Tears skin  . Dilaudid [Hydromorphone Hcl]     Made tongue swell  . Haldol [Haloperidol] Other (See Comments)    Hallucinations    REVIEW OF SYSTEMS:   CONSTITUTIONAL: No fever, fatigue or weakness.  EYES: No blurred or double vision.  EARS, NOSE, AND THROAT: No tinnitus or ear pain.  RESPIRATORY: No cough, shortness of breath, wheezing or hemoptysis.  CARDIOVASCULAR: No chest pain, orthopnea, edema.  GASTROINTESTINAL: No nausea, vomiting, diarrhea or abdominal pain.  GENITOURINARY: No dysuria, hematuria.  ENDOCRINE: No polyuria, nocturia,  HEMATOLOGY: No anemia, easy bruising or bleeding SKIN: No rash or lesion. MUSCULOSKELETAL: Pain in right hip and right elbow  NEUROLOGIC: No tingling, numbness, weakness.  PSYCHIATRY: No anxiety or depression.   MEDICATIONS AT HOME:  Prior to Admission medications   Medication Sig Start Date End Date Taking? Authorizing Provider  albuterol (PROVENTIL HFA;VENTOLIN HFA) 108 (90 Base) MCG/ACT inhaler Inhale 2 puffs into the lungs every 6 (six) hours as needed for wheezing or shortness of breath.    [provider]  Calcium Carbonate-Vit D-Min (CALCIUM-VITAMIN D-MINERALS) 600-400 MG-UNIT CHEW Chew 1 tablet by mouth 2 (two) times daily.     [provider]  clopidogrel (PLAVIX) 75 MG tablet TAKE 1 TABLET BY MOUTH ONCE DAILY. 11/26/16   Annice Needy, MD  DULoxetine (CYMBALTA) 30 MG capsule Take 30 mg by mouth at bedtime. *Note dose*    [provider]  DULoxetine (CYMBALTA) 60 MG capsule Take 60 mg by mouth every morning. *Note dose*    [provider]   Etanercept 25 MG/0.5ML SOSY Inject 1 mL into the skin once a week. *Give on Friday* 10/28/15   [provider]  FLECTOR 1.3 % PTCH Apply 1 patch topically 2 (two) times daily. 09/20/16   [provider]  Fluticasone Furoate-Vilanterol (BREO ELLIPTA) 100-25 MCG/INH AEPB Inhale 1 puff into the lungs every morning.    [provider]  folic acid (FOLVITE) 1 MG tablet Take 1 mg by mouth daily.    [provider]  gabapentin (NEURONTIN) 300 MG capsule Take 600 mg by mouth at bedtime. May take an additional 300mg s 4 times daily as needed for pain    [provider]  HYDROcodone-acetaminophen (NORCO) 5-325 MG tablet Take 1 tablet by mouth every 6 (six) hours as needed for moderate pain. Patient not taking: Reported on 11/30/2016 10/01/16   Kennedy Bucker, MD  ibuprofen (ADVIL,MOTRIN) 200 MG tablet Take 400 mg by mouth every 6 (six) hours as needed for mild pain. Reported on 12/07/2015    [provider]  Methotrexate, PF, 25 MG/0.4ML SOAJ Inject 20 mg into the skin once a week. *Give on Friday* 10/28/15   [provider]  omeprazole (PRILOSEC) 20 MG capsule Take 20 mg by mouth 2 (two) times daily.     [provider]  oxymorphone (OPANA) 5 MG tablet Take 5 mg by mouth every 4 (four) hours as needed for pain.    [provider]  polyethylene glycol (MIRALAX / GLYCOLAX) packet Take 17 g by mouth daily. Patient taking differently: Take 17 g by mouth daily as needed for mild constipation.  02/09/16   Katha Hamming, MD  pravastatin (PRAVACHOL) 10 MG tablet Take 10 mg by mouth at bedtime. 05/24/16 05/24/17  [provider]  QUEtiapine (SEROQUEL) 100 MG tablet Take 1.5 tablets (150 mg total) by mouth at bedtime. 11/11/15   Ramonita Lab, MD  Trolamine Salicylate (ASPERCREME EX) Apply 1 application topically 2 (two) times daily as needed (pain).    [provider]  vitamin C (ASCORBIC ACID) 500 MG tablet Take 500 mg by  mouth daily.    [provider]      PHYSICAL EXAMINATION:   VITAL SIGNS: Blood pressure 110/69, pulse 97, temperature 98.6 F (37 C), temperature source Oral, resp. rate 20, height 5\' 2"  (1.575 m), weight 61.7 kg (136 lb), SpO2 92 %.  GENERAL:  71 y.o.-year-old patient lying in the bed with no acute distress.  EYES: Pupils equal, round, reactive to light and accommodation. No scleral icterus. Extraocular muscles intact.  HEENT: Head atraumatic, normocephalic. Oropharynx and nasopharynx clear.  NECK:  Supple, no jugular venous distention. No thyroid enlargement, no tenderness.  LUNGS: Normal breath sounds bilaterally, no wheezing, rales,rhonchi or crepitation. No use of accessory muscles of respiration.  CARDIOVASCULAR: S1, S2 normal. No murmurs, rubs, or gallops.  ABDOMEN: Soft, nontender, nondistended. Bowel sounds present. No organomegaly or mass.  EXTREMITIES: No pedal edema, cyanosis, or clubbing.  NEUROLOGIC: Cranial nerves II through XII are intact. Muscle strength 5/5 in all extremities. Sensation intact. Gait not checked.  PSYCHIATRIC: The patient is alert and oriented x 3.  SKIN: No obvious rash, lesion, or ulcer.   LABORATORY PANEL:   CBC  Recent Labs Lab 02/18/17 0140  WBC 18.6*  HGB 11.5*  HCT 33.4*  PLT 662*  MCV 86.0  MCH 29.6  MCHC 34.5  RDW 17.3*  LYMPHSABS 2.4  MONOABS 1.3*  EOSABS 0.0  BASOSABS 0.2*   ------------------------------------------------------------------------------------------------------------------  Chemistries   Recent Labs Lab 02/18/17 0140  NA 136  K 2.8*  CL 99*  CO2 26  GLUCOSE 245*  BUN 27*  CREATININE 1.04*  CALCIUM 8.1*  AST 30  ALT 16  ALKPHOS 134*  BILITOT 0.5   ------------------------------------------------------------------------------------------------------------------ estimated creatinine clearance is 42.8 mL/min (A) (by C-G formula based on SCr of 1.04 mg/dL  (H)). ------------------------------------------------------------------------------------------------------------------ No results for input(s): TSH, T4TOTAL, T3FREE, THYROIDAB in the last 72 hours.  Invalid input(s): FREET3   Coagulation profile No results for input(s): INR, PROTIME in the last 168 hours. ------------------------------------------------------------------------------------------------------------------- No results for input(s): DDIMER in the last 72 hours. -------------------------------------------------------------------------------------------------------------------  Cardiac Enzymes No results for input(s): CKMB, TROPONINI, MYOGLOBIN in the last 168 hours.  Invalid input(s): CK ------------------------------------------------------------------------------------------------------------------ Invalid input(s): POCBNP  ---------------------------------------------------------------------------------------------------------------  Urinalysis    Component Value Date/Time   COLORURINE AMBER (A) 11/30/2016 1325  APPEARANCEUR CLOUDY (A) 11/30/2016 1325   APPEARANCEUR HAZY 07/21/2013 1038   LABSPEC 1.025 11/30/2016 1325   LABSPEC 1.010 07/21/2013 1038   PHURINE 5.0 11/30/2016 1325   GLUCOSEU NEGATIVE 11/30/2016 1325   GLUCOSEU NEGATIVE 07/21/2013 1038   HGBUR NEGATIVE 11/30/2016 1325   BILIRUBINUR NEGATIVE 11/30/2016 1325   BILIRUBINUR NEGATIVE 07/21/2013 1038   KETONESUR NEGATIVE 11/30/2016 1325   PROTEINUR 100 (A) 11/30/2016 1325   UROBILINOGEN 0.2 12/27/2013 1612   NITRITE NEGATIVE 11/30/2016 1325   LEUKOCYTESUR SMALL (A) 11/30/2016 1325   LEUKOCYTESUR 2+ 07/21/2013 1038     RADIOLOGY: Dg Chest 1 View  Result Date: 02/18/2017 CLINICAL DATA:  Fall going to the bathroom tonight. EXAM: CHEST 1 VIEW COMPARISON:  Radiograph 10/28/2016 FINDINGS: The cardiomediastinal contours are normal. Atherosclerosis of the thoracic aorta. Vascular stent projects over the  left mediastinum. Pulmonary vasculature is normal. Mild chronic bronchial thickening and hyperinflation. No consolidation, pleural effusion, or pneumothorax. Multiple thoracic vertebral compression fractures with vertebral augmentation. Remote right proximal humerus fracture. Bones are diffusely under mineralized. IMPRESSION: 1. No acute abnormality. Mild chronic bronchial thickening and hyperinflation suggesting COPD. 2. Thoracic aortic atherosclerosis. 3. Multiple compression fractures in the thoracic spine, some of which with vertebral augmentation. Remote right proximal humerus fracture. Electronically Signed   By: Rubye Oaks M.D.   On: 02/18/2017 02:49   Dg Elbow Complete Right  Result Date: 02/18/2017 CLINICAL DATA:  Right elbow pain after fall going to the bathroom. EXAM: RIGHT ELBOW - COMPLETE 3+ VIEW COMPARISON:  None. FINDINGS: Displaced mid olecranon fracture. There is 11 mm distraction of posterior cortex. Articular offset of approximately 3-4 mm. There is a large joint effusion with lipohemarthrosis. Soft tissue edema posteriorly about the elbow. No additional acute fracture. IMPRESSION: Displaced mid olecranon fracture with large joint effusion. Electronically Signed   By: Rubye Oaks M.D.   On: 02/18/2017 02:46   Dg Knee 2 Views Right  Result Date: 02/18/2017 CLINICAL DATA:  All going to the bathroom, right knee pain. EXAM: RIGHT KNEE - 1-2 VIEW COMPARISON:  None. FINDINGS: Fibula is partially obscured on AP view due to overlapping osseous structures. No evidence of fracture, dislocation, or joint effusion. Diffuse bony under mineralization. Growth arrest line in the proximal tibia. Minimal tibiofemoral joint space narrowing. Alignment is maintained. Soft tissues are unremarkable. IMPRESSION: No fracture of the right knee allowing for limited visualization of the fibula. Electronically Signed   By: Rubye Oaks M.D.   On: 02/18/2017 02:45   Ct Head Wo Contrast  Result Date:  02/18/2017 CLINICAL DATA:  Fall EXAM: CT HEAD WITHOUT CONTRAST CT CERVICAL SPINE WITHOUT CONTRAST TECHNIQUE: Multidetector CT imaging of the head and cervical spine was performed following the standard protocol without intravenous contrast. Multiplanar CT image reconstructions of the cervical spine were also generated. COMPARISON:  Head CT 11/10/2015 FINDINGS: CT HEAD FINDINGS Brain: No mass lesion, intraparenchymal hemorrhage or extra-axial collection. No evidence of acute cortical infarct. Brain parenchyma and CSF-containing spaces are normal for age. Vascular: No hyperdense vessel or unexpected calcification. Skull: Normal visualized skull base, calvarium and extracranial soft tissues. Sinuses/Orbits: No sinus fluid levels or advanced mucosal thickening. No mastoid effusion. Normal orbits. CT CERVICAL SPINE FINDINGS Alignment: No static subluxation. Facets are aligned. Occipital condyles are normally positioned. Skull base and vertebrae: No acute fracture. Soft tissues and spinal canal: No prevertebral fluid or swelling. No visible canal hematoma. Disc levels: No advanced spinal canal or neural foraminal stenosis. Upper chest: No pneumothorax, pulmonary nodule or pleural  effusion. Other: Normal visualized paraspinal cervical soft tissues. IMPRESSION: 1. Normal head CT for age.  No acute intracranial abnormality. 2. No acute fracture or static subluxation of the cervical spine. Electronically Signed   By: Deatra Robinson M.D.   On: 02/18/2017 03:41   Ct Cervical Spine Wo Contrast  Result Date: 02/18/2017 CLINICAL DATA:  Fall EXAM: CT HEAD WITHOUT CONTRAST CT CERVICAL SPINE WITHOUT CONTRAST TECHNIQUE: Multidetector CT imaging of the head and cervical spine was performed following the standard protocol without intravenous contrast. Multiplanar CT image reconstructions of the cervical spine were also generated. COMPARISON:  Head CT 11/10/2015 FINDINGS: CT HEAD FINDINGS Brain: No mass lesion, intraparenchymal  hemorrhage or extra-axial collection. No evidence of acute cortical infarct. Brain parenchyma and CSF-containing spaces are normal for age. Vascular: No hyperdense vessel or unexpected calcification. Skull: Normal visualized skull base, calvarium and extracranial soft tissues. Sinuses/Orbits: No sinus fluid levels or advanced mucosal thickening. No mastoid effusion. Normal orbits. CT CERVICAL SPINE FINDINGS Alignment: No static subluxation. Facets are aligned. Occipital condyles are normally positioned. Skull base and vertebrae: No acute fracture. Soft tissues and spinal canal: No prevertebral fluid or swelling. No visible canal hematoma. Disc levels: No advanced spinal canal or neural foraminal stenosis. Upper chest: No pneumothorax, pulmonary nodule or pleural effusion. Other: Normal visualized paraspinal cervical soft tissues. IMPRESSION: 1. Normal head CT for age.  No acute intracranial abnormality. 2. No acute fracture or static subluxation of the cervical spine. Electronically Signed   By: Deatra Robinson M.D.   On: 02/18/2017 03:41   Dg Hip Unilat  With Pelvis 2-3 Views Right  Result Date: 02/18/2017 CLINICAL DATA:  Fall tonight while going to the bathroom. Right hip pain, short and and rotated. EXAM: DG HIP (WITH OR WITHOUT PELVIS) 2-3V RIGHT COMPARISON:  None. FINDINGS: Displaced mildly comminuted intertrochanteric right hip fracture with displacement of the greater and lesser trochanteric. There is proximal migration of the femoral shaft. Femoral head remains seated in the acetabulum. Remainder the bony pelvis including pubic rami appear intact. Pubic symphysis and sacroiliac joints are congruent. Diffuse bony under mineralization. Postsurgical change in the lumbar spine and vertebral augmentation. IMPRESSION: Displaced mildly comminuted intertrochanteric right hip fracture with proximal migration of the femoral shaft. Electronically Signed   By: Rubye Oaks M.D.   On: 02/18/2017 02:43     EKG: Orders placed or performed during the hospital encounter of 02/18/17  . EKG 12-Lead  . EKG 12-Lead    IMPRESSION AND PLAN: 71 year old female patient with history of hypertension, rheumatoid arthritis, COPD, GERD, correlating vascular disease presented to the emergency room for fall. Admitting diagnosis 1. Right hip fracture 2. Olecranon fracture right elbow 3. Fall 4. Dehydration 5. Rheumatoid arthritis Treatment plan Admit patient to medical floor Orthopedic surgery consultation Swelling to the right arm and forearm Pain management with IV morphine and Percocet Keep patient nothing by mouth Sequential compression devices to lower extremities for DVT prophylaxis  All the records are reviewed and case discussed with ED provider. Management plans discussed with the patient, family and they are in agreement.  CODE STATUS:FULL CODE Surrogate decision maker : Husband Code Status History    Date Active Date Inactive Code Status Order ID Comments User Context   10/01/2016  1:20 PM 10/01/2016  6:20 PM Full Code [id number]  Kennedy Bucker, MD Inpatient   02/04/2016  4:31 PM 02/09/2016  5:14 PM Full Code [id number]  Marguarite Arbour, MD Inpatient   11/10/2015  4:58 AM 11/11/2015  6:48  PM Full Code [id number]  Enedina Finner, MD Inpatient   09/06/2015  4:04 AM 09/08/2015  8:04 PM Full Code [id number]  Ihor Austin, MD ED   07/26/2015  3:51 PM 08/01/2015  4:32 PM Full Code [id number]  Shaune Pollack, MD Inpatient   09/15/2014  6:36 PM 09/16/2014  5:22 PM Full Code [id number]  Dominica Severin, MD Inpatient   02/07/2014 11:04 AM 02/07/2014 10:39 PM Full Code [id number]  Hewitt Shorts, MD Inpatient   12/26/2012 11:54 PM 01/05/2013  3:34 PM Full Code [id number]  Ron Parker, MD ED       TOTAL TIME TAKING CARE OF THIS PATIENT: 54 minutes.    Ihor Austin M.D on 02/18/2017 at 4:10 AM  Between 7am to 6pm - Pager - [phone]  After 6pm go to [email or web address] - password [email or web address] [facility]  [place] [facility]  Office  [phone]  CC: Primary care physician; Lauro Regulus, MD

## 2017-02-18 NOTE — [place] FL2 (Signed)
[place] MEDICAID FL2 LEVEL OF CARE SCREENING TOOL     IDENTIFICATION  Patient Name: Michelle Cabrera Birthdate: [birth year] Sex: female Admission Date (Current Location): 02/18/2017  [place] and [place] Number:  Chiropodist and Address:  [facility], [address] [place], [place] [address]      Provider Number: [id number]  Attending Physician Name and Address:  Altamese Dilling, *  Relative Name and Phone Number:       Current Level of Care: Hospital Recommended Level of Care: Skilled Nursing Facility Prior Approval Number:    Date Approved/Denied:   PASRR Number:    Discharge Plan: SNF    Current Diagnoses: Patient Active Problem List   Diagnosis Date Noted  . Hip fracture (HCC) 02/18/2017  . Bilateral carotid artery stenosis 12/10/2016  . Subclavian arterial stenosis (HCC) 12/10/2016  . Thoracic compression fracture (HCC) 02/04/2016  . UTI (lower urinary tract infection) 02/04/2016  . Schizophrenia spectrum disorder with psychotic disorder type not yet determined 11/13/2015  . Syncope 11/10/2015  . Hypotension 11/10/2015  . 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 Pressure ulcer 09/08/2015  . New onset type 2 diabetes mellitus (HCC) 09/06/2015  . Altered mental status 09/06/2015  . COPD exacerbation (HCC) 07/26/2015  . Arthritis associated with another disorder 09/15/2014  . Wedge compression fracture of T6 vertebra (HCC) 02/07/2014  . Intractable low back pain 12/27/2012  . DDD (degenerative disc disease) 12/27/2012  . Rheumatoid arthritis (HCC) 12/27/2012  . Hypertension 12/27/2012    Orientation RESPIRATION BLADDER Height & Weight     Self, Time, Situation, Place  Normal Continent Weight: 137 lb 1.6 oz (62.2 kg) Height:  5\' 2"  (157.5 cm)  BEHAVIORAL SYMPTOMS/MOOD NEUROLOGICAL BOWEL NUTRITION STATUS   (none)  (none) Continent Diet (Regular Diet )  AMBULATORY STATUS COMMUNICATION OF NEEDS Skin   Extensive Assist  Verbally Surgical wounds                       Personal Care Assistance Level of Assistance  Bathing, Feeding, Dressing Bathing Assistance: Limited assistance Feeding assistance: Independent Dressing Assistance: Limited assistance     Functional Limitations Info  Sight, Hearing, Speech Sight Info: Adequate Hearing Info: Adequate Speech Info: Adequate    SPECIAL CARE FACTORS FREQUENCY  PT (By licensed PT), OT (By licensed OT)     PT Frequency:  (5) OT Frequency:  (5)            Contractures      Additional Factors Info  Code Status, Allergies Code Status Info:  (Full Code. ) Allergies Info:  (Ambien Zolpidem Tartrate, Gold-containing Drug Products, Adhesive Tape, Dilaudid Hydromorphone Hcl, Haldol Haloperidol)           Current Medications (02/18/2017):  This is the current hospital active medication list Current Facility-Administered Medications  Medication Dose Route Frequency Provider Last Rate Last Dose  . 0.9 % NaCl with KCl 40 mEq / L  infusion   Intravenous Continuous 02/20/2017, MD 75 mL/hr at 02/18/17 1145 75 mL/hr at 02/18/17 1145  . acetaminophen (TYLENOL) tablet 650 mg  650 mg Oral Q6H PRN 02/20/17, MD       Or  . acetaminophen (TYLENOL) suppository 650 mg  650 mg Rectal Q6H PRN Pyreddy, Pavan, MD      . albuterol (PROVENTIL) (2.5 MG/3ML) 0.083% nebulizer solution 3 mL  3 mL Inhalation Q6H PRN Ihor Austin, MD      . calcium-vitamin D (OSCAL WITH D)  500-200 MG-UNIT per tablet 1 tablet  1 tablet Oral BID Ihor Austin, MD   1 tablet at 02/18/17 1101  . ceFAZolin (ANCEF) IVPB 2g/100 mL premix  2 g Intravenous To OR Hooten, Illene Labrador, MD      . DULoxetine (CYMBALTA) DR capsule 60 mg  60 mg Oral Burnis Medin, MD   60 mg at 02/18/17 0830  . fluticasone furoate-vilanterol (BREO ELLIPTA) 100-25 MCG/INH 1 puff  1 puff Inhalation q morning - 10a Pyreddy, Vivien Rota, MD   1 puff at 02/18/17 1110  . folic acid (FOLVITE) tablet 1 mg  1 mg  Oral Daily Pyreddy, Pavan, MD   1 mg at 02/18/17 1100  . gabapentin (NEURONTIN) capsule 600 mg  600 mg Oral QHS Pyreddy, Pavan, MD      . HYDROcodone-acetaminophen (NORCO/VICODIN) 5-325 MG per tablet 1-2 tablet  1-2 tablet Oral Q4H PRN Ihor Austin, MD   1 tablet at 02/18/17 1101  . insulin aspart (novoLOG) injection 0-9 Units  0-9 Units Subcutaneous TID WC Altamese Dilling, MD      . morphine (MS CONTIN) 12 hr tablet 15 mg  15 mg Oral Q12H Pyreddy, Vivien Rota, MD   15 mg at 02/18/17 1100  . morphine 2 MG/ML injection 2 mg  2 mg Intravenous Q2H PRN Hooten, Illene Labrador, MD      . ondansetron (ZOFRAN) tablet 4 mg  4 mg Oral Q6H PRN Ihor Austin, MD       Or  . ondansetron (ZOFRAN) injection 4 mg  4 mg Intravenous Q6H PRN Pyreddy, Pavan, MD      . oxyCODONE (Oxy IR/ROXICODONE) immediate release tablet 5-10 mg  5-10 mg Oral Q4H PRN Hooten, Illene Labrador, MD   5 mg at 02/18/17 0845  . pantoprazole (PROTONIX) EC tablet 40 mg  40 mg Oral Daily Pyreddy, Pavan, MD   40 mg at 02/18/17 1100  . potassium chloride SA (K-DUR,KLOR-CON) CR tablet 40 mEq  40 mEq Oral Once Pyreddy, Vivien Rota, MD      . pravastatin (PRAVACHOL) tablet 10 mg  10 mg Oral QHS Pyreddy, Pavan, MD      . QUEtiapine (SEROQUEL) tablet 150 mg  150 mg Oral QHS Pyreddy, Pavan, MD      . senna-docusate (Senokot-S) tablet 1 tablet  1 tablet Oral QHS PRN Pyreddy, Vivien Rota, MD      . traMADol Janean Sark) tablet 50-100 mg  50-100 mg Oral Q4H PRN Hooten, Illene Labrador, MD   100 mg at 02/18/17 1210  . vitamin C (ASCORBIC ACID) tablet 500 mg  500 mg Oral Daily Pyreddy, Vivien Rota, MD   500 mg at 02/18/17 1210     Discharge Medications: Please see discharge summary for a list of discharge medications.  Relevant Imaging Results:  Relevant Lab Results:   Additional Information  (SSN: [id number])  Sample, Darleen Crocker, LCSW

## 2017-02-18 NOTE — ED Provider Notes (Addendum)
[facility] Emergency Department Provider Note   ____________________________________________   First MD Initiated Contact with Patient 02/18/17 0148     (approximate)  I have reviewed the triage vital signs and the nursing notes.   HISTORY  Chief Complaint Fall and Hip Pain    HPI Michelle Cabrera is a 71 y.o. female who comes into the hospital today after a fall and tonight she got up and went to the door. After going to the door the patient reports that she fell. She reports that the dizziness has been going on for 2 years. The patient's husband reports that she is typically not supposed to walk by herself.the patient was at the edge of the hallway when she fell on her right side. She is unsure if she hit her head it all happened so fast. She reports though that she did hit her arm hip and knee. The patient has pain in her right knee, hip and elbow. She rates her pain a 10 out of 10 in intensity. The patient did receive some fentanyl by EMS. She denies any numbness or tingling and denies any chest pain or shortness of breath. She has had abdominal pain on and off and she reports that she took a laxative today for constipation. The patient denies any nausea or vomiting. She is here today for evaluation of her symptoms.   Past Medical History:  Diagnosis Date  . Anxiety   . Chronic lower back pain   . Closed compression fracture of L1 lumbar vertebra (HCC) 11/2012  . Collagen vascular disease (HCC)   . COPD (chronic obstructive pulmonary disease) (HCC)   . DDD (degenerative disc disease)   . Depression   . Fall 09/27/2016   Fall Feb 04, 2016 with broken vertebra  . GERD (gastroesophageal reflux disease)   . Hypertension   . Rheumatoid arthritis [facility])     Patient Active Problem List   Diagnosis Date Noted  . Hip fracture (HCC) 02/18/2017  . Bilateral carotid artery stenosis 12/10/2016  . Subclavian arterial stenosis (HCC) 12/10/2016  . Thoracic  compression fracture (HCC) 02/04/2016  . UTI (lower urinary tract infection) 02/04/2016  . Schizophrenia spectrum disorder with psychotic disorder type not yet determined 11/13/2015  . Syncope 11/10/2015  . Hypotension 11/10/2015  . 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 Pressure ulcer 09/08/2015  . New onset type 2 diabetes mellitus (HCC) 09/06/2015  . Altered mental status 09/06/2015  . COPD exacerbation (HCC) 07/26/2015  . Arthritis associated with another disorder 09/15/2014  . Wedge compression fracture of T6 vertebra (HCC) 02/07/2014  . Intractable low back pain 12/27/2012  . DDD (degenerative disc disease) 12/27/2012  . Rheumatoid arthritis (HCC) 12/27/2012  . Hypertension 12/27/2012    Past Surgical History:  Procedure Laterality Date  . BACK SURGERY    . BREAST BIOPSY Left    negative  . CATARACT EXTRACTION W/ INTRAOCULAR LENS  IMPLANT, BILATERAL Bilateral   . FINGER ARTHROPLASTY Right 09/15/2014   Procedure: RIGHT MIDDLE FINGER, RING FINGER, SMALL FINGER EXTENSOR DIGITORUM COMMUMIS STABILIZATION WITH RIGHT MIDDLE FINGER MCP ARTHROPLASTY, POSSIBLE RING FINGER AND SMALL FINGER MCP ARTHROPLASTY;  Surgeon: Dominica Severin, MD;  Location: [facility] OR;  Service: Orthopedics;  Laterality: Right;  . FOOT SURGERY    . HAND TENDON SURGERY Right 09/15/2014   "d/t RA"  . KYPHOPLASTY N/A 01/04/2013   Procedure: L1 Kyphoplasty;  Surgeon: Hewitt Shorts, MD;  Location: [facility] NEURO ORS;  Service: Neurosurgery;  Laterality: N/A;  L1  Kyphoplasty  . KYPHOPLASTY N/A 02/07/2014   Procedure: THORACIC SIX KYPHOPLASTY;  Surgeon: Hewitt Shorts, MD;  Location: [facility] NEURO ORS;  Service: Neurosurgery;  Laterality: N/A;  T6 Kyphoplasty   . KYPHOPLASTY N/A 02/08/2016   Procedure: KYPHOPLASTY  T-12;  Surgeon: Kennedy Bucker, MD;  Location: [facility] ORS;  Service: Orthopedics;  Laterality: N/A;  . KYPHOPLASTY N/A 10/01/2016   Procedure: KYPHOPLASTY  L-5;  Surgeon: Kennedy Bucker, MD;  Location: [facility] ORS;  Service:  Orthopedics;  Laterality: N/A;  . LUMBAR DISC SURGERY      X 3  . PERIPHERAL VASCULAR CATHETERIZATION Left 12/07/2015   Procedure: Upper Extremity Angiography;  Surgeon: Annice Needy, MD;  Location: [facility] INVASIVE CV LAB;  Service: Cardiovascular;  Laterality: Left;  . PERIPHERAL VASCULAR CATHETERIZATION  12/07/2015   Procedure: Upper Extremity Intervention;  Surgeon: Annice Needy, MD;  Location: [facility] INVASIVE CV LAB;  Service: Cardiovascular;;  . POSTERIOR LUMBAR FUSION     "got screws in"  . TONSILLECTOMY    . TUBAL LIGATION    . WRIST FUSION Right    30 yrs ago    Prior to Admission medications   Medication Sig Start Date End Date Taking? Authorizing Provider  albuterol (PROVENTIL HFA;VENTOLIN HFA) 108 (90 Base) MCG/ACT inhaler Inhale 2 puffs into the lungs every 6 (six) hours as needed for wheezing or shortness of breath.    [provider]  Calcium Carbonate-Vit D-Min (CALCIUM-VITAMIN D-MINERALS) 600-400 MG-UNIT CHEW Chew 1 tablet by mouth 2 (two) times daily.     [provider]  clopidogrel (PLAVIX) 75 MG tablet TAKE 1 TABLET BY MOUTH ONCE DAILY. 11/26/16   Annice Needy, MD  DULoxetine (CYMBALTA) 30 MG capsule Take 30 mg by mouth at bedtime. *Note dose*    [provider]  DULoxetine (CYMBALTA) 60 MG capsule Take 60 mg by mouth every morning. *Note dose*    [provider]  Etanercept 25 MG/0.5ML SOSY Inject 1 mL into the skin once a week. *Give on Friday* 10/28/15   [provider]  FLECTOR 1.3 % PTCH Apply 1 patch topically 2 (two) times daily. 09/20/16   [provider]  Fluticasone Furoate-Vilanterol (BREO ELLIPTA) 100-25 MCG/INH AEPB Inhale 1 puff into the lungs every morning.    [provider]  folic acid (FOLVITE) 1 MG tablet Take 1 mg by mouth daily.    [provider]  gabapentin (NEURONTIN) 300 MG capsule Take 600 mg by mouth at bedtime. May take an additional 300mg s 4 times daily as needed for pain    [provider]  HYDROcodone-acetaminophen (NORCO) 5-325 MG tablet Take 1 tablet by mouth every 6 (six) hours as needed for moderate pain. Patient not taking: Reported on 11/30/2016 10/01/16   12/01/16, MD  ibuprofen (ADVIL,MOTRIN) 200 MG tablet Take 400 mg by mouth every 6 (six) hours as needed for mild pain. Reported on 12/07/2015    [provider]  Methotrexate, PF, 25 MG/0.4ML SOAJ Inject 20 mg into the skin once a week. *Give on Friday* 10/28/15   [provider]  omeprazole (PRILOSEC) 20 MG capsule Take 20 mg by mouth 2 (two) times daily.     [provider]  oxymorphone (OPANA) 5 MG tablet Take 5 mg by mouth every 4 (four) hours as needed for pain.    [provider]  polyethylene glycol (MIRALAX / GLYCOLAX) packet Take 17 g by mouth daily. Patient taking differently: Take 17 g by mouth daily as  needed for mild constipation.  02/09/16   Katha Hamming, MD  pravastatin (PRAVACHOL) 10 MG tablet Take 10 mg by mouth at bedtime. 05/24/16 05/24/17  [provider]  QUEtiapine (SEROQUEL) 100 MG tablet Take 1.5 tablets (150 mg total) by mouth at bedtime. 11/11/15   Ramonita Lab, MD  Trolamine Salicylate (ASPERCREME EX) Apply 1 application topically 2 (two) times daily as needed (pain).    [provider]  vitamin C (ASCORBIC ACID) 500 MG tablet Take 500 mg by mouth daily.    [provider]    Allergies Ambien [zolpidem tartrate]; Gold-containing drug products; Adhesive [tape]; Dilaudid [hydromorphone hcl]; and Haldol [haloperidol]  Family History  Problem Relation Age of Onset  . CAD Father   . Alcohol abuse Father   . CAD Mother   . Hypertension Mother   . Peripheral vascular disease Mother   . Breast cancer Maternal Aunt 50  . Alcohol abuse Sister     Social History Social History  Substance Use Topics  . Smoking status: Former Smoker    Packs/day: 1.00    Years: 53.00    Types: Cigarettes    Quit date: 07/25/2015    . Smokeless tobacco: Former Neurosurgeon    Quit date: 07/15/2015  . Alcohol use No    Review of Systems  Constitutional: No fever/chills Eyes: No visual changes. ENT: No sore throat. Cardiovascular: Denies chest pain. Respiratory: Denies shortness of breath. Gastrointestinal: constipation with some abdominal pain.  No nausea, no vomiting.  No diarrhea.  Genitourinary: Negative for dysuria. Musculoskeletal:right hip pain, right elbow pain, right knee pain Skin: Negative for rash. Neurological: dizziness   ____________________________________________   PHYSICAL EXAM:  VITAL SIGNS: ED Triage Vitals  Enc Vitals Group     BP 02/18/17 0139 139/84     Pulse Rate 02/18/17 0139 (!) 108     Resp 02/18/17 0139 16     Temp 02/18/17 0139 98.6 F (37 C)     Temp Source 02/18/17 0139 Oral     SpO2 02/18/17 0136 97 %     Weight 02/18/17 0139 136 lb (61.7 kg)     Height 02/18/17 0139 5\' 2"  (1.575 m)     Head Circumference --      Peak Flow --      Pain Score 02/18/17 0138 10     Pain Loc --      Pain Edu? --      Excl. in GC? --     Constitutional: Alert and oriented. Well appearing and in moderate distress. Eyes: Conjunctivae are normal. PERRL. EOMI. Head: Atraumatic. Nose: No congestion/rhinnorhea. Mouth/Throat: Mucous membranes are moist.  Oropharynx non-erythematous. Neck: no cervical spine tenderness to palpation Cardiovascular: Normal rate, regular rhythm. Grossly normal heart sounds.  Good peripheral circulation. Respiratory: Normal respiratory effort.  No retractions. Lungs CTAB. Gastrointestinal: Soft and nontender. No distention. Positive bowel sounds Musculoskeletal: right hip tenderness to palpation, leg shortened and externally rotated. Right elbow tender to palpation   Neurologic:  Normal speech and language.  Skin:  Skin is warm, dry and intact.  Psychiatric: Mood and affect are normal.   ____________________________________________   LABS (all labs ordered are  listed, but only abnormal results are displayed)  Labs Reviewed  CBC WITH DIFFERENTIAL/PLATELET - Abnormal; Notable for the following:       Result Value   WBC 18.6 (*)    Hemoglobin 11.5 (*)    HCT 33.4 (*)    RDW 17.3 (*)    Platelets  662 (*)    Neutro Abs 14.7 (*)    Monocytes Absolute 1.3 (*)    Basophils Absolute 0.2 (*)    All other components within normal limits  COMPREHENSIVE METABOLIC PANEL - Abnormal; Notable for the following:    Potassium 2.8 (*)    Chloride 99 (*)    Glucose, Bld 245 (*)    BUN 27 (*)    Creatinine, Ser 1.04 (*)    Calcium 8.1 (*)    Albumin 2.9 (*)    Alkaline Phosphatase 134 (*)    GFR calc non Af Amer 53 (*)    All other components within normal limits   ____________________________________________  EKG  ED ECG REPORT I, Rebecka Apley, the attending physician, personally viewed and interpreted this ECG.   Date: 02/18/2017  EKG Time: 138  Rate: 106  Rhythm: sinus tachycardia  Axis: normal  Intervals:prolonged qtc  ST&T Change: inverted T waves in lead V3  ____________________________________________  RADIOLOGY  Dg Chest 1 View  Result Date: 02/18/2017 CLINICAL DATA:  Fall going to the bathroom tonight. EXAM: CHEST 1 VIEW COMPARISON:  Radiograph 10/28/2016 FINDINGS: The cardiomediastinal contours are normal. Atherosclerosis of the thoracic aorta. Vascular stent projects over the left mediastinum. Pulmonary vasculature is normal. Mild chronic bronchial thickening and hyperinflation. No consolidation, pleural effusion, or pneumothorax. Multiple thoracic vertebral compression fractures with vertebral augmentation. Remote right proximal humerus fracture. Bones are diffusely under mineralized. IMPRESSION: 1. No acute abnormality. Mild chronic bronchial thickening and hyperinflation suggesting COPD. 2. Thoracic aortic atherosclerosis. 3. Multiple compression fractures in the thoracic spine, some of which with vertebral augmentation.  Remote right proximal humerus fracture. Electronically Signed   By: Rubye Oaks M.D.   On: 02/18/2017 02:49   Dg Elbow Complete Right  Result Date: 02/18/2017 CLINICAL DATA:  Right elbow pain after fall going to the bathroom. EXAM: RIGHT ELBOW - COMPLETE 3+ VIEW COMPARISON:  None. FINDINGS: Displaced mid olecranon fracture. There is 11 mm distraction of posterior cortex. Articular offset of approximately 3-4 mm. There is a large joint effusion with lipohemarthrosis. Soft tissue edema posteriorly about the elbow. No additional acute fracture. IMPRESSION: Displaced mid olecranon fracture with large joint effusion. Electronically Signed   By: Rubye Oaks M.D.   On: 02/18/2017 02:46   Dg Knee 2 Views Right  Result Date: 02/18/2017 CLINICAL DATA:  All going to the bathroom, right knee pain. EXAM: RIGHT KNEE - 1-2 VIEW COMPARISON:  None. FINDINGS: Fibula is partially obscured on AP view due to overlapping osseous structures. No evidence of fracture, dislocation, or joint effusion. Diffuse bony under mineralization. Growth arrest line in the proximal tibia. Minimal tibiofemoral joint space narrowing. Alignment is maintained. Soft tissues are unremarkable. IMPRESSION: No fracture of the right knee allowing for limited visualization of the fibula. Electronically Signed   By: Rubye Oaks M.D.   On: 02/18/2017 02:45   Ct Head Wo Contrast  Result Date: 02/18/2017 CLINICAL DATA:  Fall EXAM: CT HEAD WITHOUT CONTRAST CT CERVICAL SPINE WITHOUT CONTRAST TECHNIQUE: Multidetector CT imaging of the head and cervical spine was performed following the standard protocol without intravenous contrast. Multiplanar CT image reconstructions of the cervical spine were also generated. COMPARISON:  Head CT 11/10/2015 FINDINGS: CT HEAD FINDINGS Brain: No mass lesion, intraparenchymal hemorrhage or extra-axial collection. No evidence of acute cortical infarct. Brain parenchyma and CSF-containing spaces are normal for age.  Vascular: No hyperdense vessel or unexpected calcification. Skull: Normal visualized skull base, calvarium and extracranial soft tissues. Sinuses/Orbits:  No sinus fluid levels or advanced mucosal thickening. No mastoid effusion. Normal orbits. CT CERVICAL SPINE FINDINGS Alignment: No static subluxation. Facets are aligned. Occipital condyles are normally positioned. Skull base and vertebrae: No acute fracture. Soft tissues and spinal canal: No prevertebral fluid or swelling. No visible canal hematoma. Disc levels: No advanced spinal canal or neural foraminal stenosis. Upper chest: No pneumothorax, pulmonary nodule or pleural effusion. Other: Normal visualized paraspinal cervical soft tissues. IMPRESSION: 1. Normal head CT for age.  No acute intracranial abnormality. 2. No acute fracture or static subluxation of the cervical spine. Electronically Signed   By: Deatra Robinson M.D.   On: 02/18/2017 03:41   Ct Cervical Spine Wo Contrast  Result Date: 02/18/2017 CLINICAL DATA:  Fall EXAM: CT HEAD WITHOUT CONTRAST CT CERVICAL SPINE WITHOUT CONTRAST TECHNIQUE: Multidetector CT imaging of the head and cervical spine was performed following the standard protocol without intravenous contrast. Multiplanar CT image reconstructions of the cervical spine were also generated. COMPARISON:  Head CT 11/10/2015 FINDINGS: CT HEAD FINDINGS Brain: No mass lesion, intraparenchymal hemorrhage or extra-axial collection. No evidence of acute cortical infarct. Brain parenchyma and CSF-containing spaces are normal for age. Vascular: No hyperdense vessel or unexpected calcification. Skull: Normal visualized skull base, calvarium and extracranial soft tissues. Sinuses/Orbits: No sinus fluid levels or advanced mucosal thickening. No mastoid effusion. Normal orbits. CT CERVICAL SPINE FINDINGS Alignment: No static subluxation. Facets are aligned. Occipital condyles are normally positioned. Skull base and vertebrae: No acute fracture. Soft tissues  and spinal canal: No prevertebral fluid or swelling. No visible canal hematoma. Disc levels: No advanced spinal canal or neural foraminal stenosis. Upper chest: No pneumothorax, pulmonary nodule or pleural effusion. Other: Normal visualized paraspinal cervical soft tissues. IMPRESSION: 1. Normal head CT for age.  No acute intracranial abnormality. 2. No acute fracture or static subluxation of the cervical spine. Electronically Signed   By: Deatra Robinson M.D.   On: 02/18/2017 03:41   Dg Hip Unilat  With Pelvis 2-3 Views Right  Result Date: 02/18/2017 CLINICAL DATA:  Fall tonight while going to the bathroom. Right hip pain, short and and rotated. EXAM: DG HIP (WITH OR WITHOUT PELVIS) 2-3V RIGHT COMPARISON:  None. FINDINGS: Displaced mildly comminuted intertrochanteric right hip fracture with displacement of the greater and lesser trochanteric. There is proximal migration of the femoral shaft. Femoral head remains seated in the acetabulum. Remainder the bony pelvis including pubic rami appear intact. Pubic symphysis and sacroiliac joints are congruent. Diffuse bony under mineralization. Postsurgical change in the lumbar spine and vertebral augmentation. IMPRESSION: Displaced mildly comminuted intertrochanteric right hip fracture with proximal migration of the femoral shaft. Electronically Signed   By: Rubye Oaks M.D.   On: 02/18/2017 02:43    ____________________________________________   PROCEDURES  Procedure(s) performed: please, see procedure note(s).  .Splint Application Date/Time: 02/18/2017 3:50 AM Performed by: Rebecka Apley Authorized by: Rebecka Apley   Consent:    Consent obtained:  Verbal   Consent given by:  Patient Pre-procedure details:    Sensation:  Normal Procedure details:    Laterality:  Right   Location:  Elbow   Elbow:  R elbow   Splint type:  Long arm   Supplies:  Ortho-Glass and sling Post-procedure details:    Pain:  Unchanged   Sensation:  Normal    Skin color:  Pink   Patient tolerance of procedure:  Tolerated well, no immediate complications    Critical Care performed: No  ____________________________________________   INITIAL IMPRESSION /  ASSESSMENT AND PLAN / ED COURSE  Pertinent labs & imaging results that were available during my care of the patient were reviewed by me and considered in my medical decision making (see chart for details).  This is a 71 year old female who comes into the hospital today with some hip pain and elbow pain and knee pain after a fall at home. It appears that the patient has a displaced mildly comminuted intertrochanteric right hip fracture as well as an olecranon fracture. I will give the patient 4 morphine and 4 Zofran. I will await the results of the CT scan as well as the blood work and I will reassess the patient.     The patient's CT scan is unremarkable. She will be admitted to the hospitalist service for further evaluation. I contacted Dr. Ernest Pine from orthopedic surgery and he reports that he will see the patient in the morning. The patient will be admitted.  ____________________________________________   FINAL CLINICAL IMPRESSION(S) / ED DIAGNOSES  Final diagnoses:  Closed fracture of right hip, initial encounter (HCC)  Closed fracture of right olecranon process, initial encounter  Fall, initial encounter      NEW MEDICATIONS STARTED DURING THIS VISIT:  New Prescriptions   No medications on file     Note:  This document was prepared using Dragon voice recognition software and may include unintentional dictation errors.    Rebecka Apley, MD 02/18/17 0352    Rebecka Apley, MD 02/18/17 [id number]

## 2017-02-18 NOTE — Clinical Social Work Note (Signed)
Clinical Social Work Assessment  Patient Details  Name: Michelle Cabrera MRN: [id number] Date of Birth: 10-21-1945  Date of referral:  02/18/17               Reason for consult:  Facility Placement                Permission sought to share information with:  Chartered certified accountant granted to share information::  Yes, Verbal Permission Granted  Name::      [place]::   [place]   Relationship::     Contact Information:     Housing/Transportation Living arrangements for the past 2 months:  [place] of Information:  Patient, Spouse Patient Interpreter Needed:  None Criminal Activity/Legal Involvement Pertinent to Current Situation/Hospitalization:  No - Comment as needed Significant Relationships:  Spouse Lives with:  Spouse Do you feel safe going back to the place where you live?  Yes Need for family participation in patient care:  Yes (Comment)  Care giving concerns:  Patient lives in [place] with her husband [place].    Social Worker assessment / plan:  Holiday representative (CSW) reviewed chart and noted that patient will have surgery tomorrow for a hip fracture. CSW met with patient prior to surgery today. Patient was alert and oriented X4. CSW introduced self and explained role of CSW department. Patient reported that she lives in [place] with her husband and has been to [facility] before. CSW explained SNF process and that [facility] will have to approve SNF. Patient verbalized her understanding and is agreeable to SNF search in [place]. CSW contacted patient's husband [name] and made him aware of above. Husband is agreeable to SNF search and also prefers [facility]. FL2 complete and faxed out. [facility] is pending. CSW will continue to follow and assist as needed.   Employment status:  Disabled (Comment on whether or not currently receiving Disability), Retired Programmer, applications PT Recommendations:  Not assessed at this time Information / Referral to community resources:  [place]  Patient/Family's Response to care:  Patient and her husband are agreeable to SNF search in [place] and prefer [facility].   Patient/Family's Understanding of and Emotional Response to Diagnosis, Current Treatment, and Prognosis:  Patient and her husband were very pleasant and thanked CSW for assistance.   Emotional Assessment Appearance:  Appears stated age Attitude/Demeanor/Rapport:    Affect (typically observed):  Accepting, Adaptable, Pleasant Orientation:  Oriented to Self, Oriented to Place, Oriented to  Time, Oriented to Situation Alcohol / Substance use:  Not Applicable Psych involvement (Current and /or in the community):  No (Comment)  Discharge Needs  Concerns to be addressed:  Discharge Planning Concerns Readmission within the last 30 days:  No Current discharge risk:  Dependent with Mobility Barriers to Discharge:  Continued Medical Work up   [employer], Veronia Beets, LCSW 02/18/2017, 2:51 PM

## 2017-02-19 ENCOUNTER — Encounter: Payer: Self-pay | Admitting: Anesthesiology

## 2017-02-19 ENCOUNTER — Inpatient Hospital Stay: Payer: Medicare Other | Admitting: Certified Registered Nurse Anesthetist

## 2017-02-19 ENCOUNTER — Inpatient Hospital Stay: Payer: Medicare Other

## 2017-02-19 ENCOUNTER — Encounter
Admission: EM | Disposition: A | Discharge status: Skilled Nursing Facility | Payer: Self-pay | Source: Home / Self Care | Attending: Internal Medicine

## 2017-02-19 HISTORY — PX: ORIF ELBOW FRACTURE: SHX5031

## 2017-02-19 HISTORY — PX: INTRAMEDULLARY (IM) NAIL INTERTROCHANTERIC: SHX5875

## 2017-02-19 LAB — URINE CULTURE: Culture: NO GROWTH

## 2017-02-19 LAB — GLUCOSE, CAPILLARY
GLUCOSE-CAPILLARY: 129 mg/dL — AB (ref 65–99)
GLUCOSE-CAPILLARY: 129 mg/dL — AB (ref 65–99)
GLUCOSE-CAPILLARY: 192 mg/dL — AB (ref 65–99)
Glucose-Capillary: 137 mg/dL — ABNORMAL HIGH (ref 65–99)
Glucose-Capillary: 258 mg/dL — ABNORMAL HIGH (ref 65–99)

## 2017-02-19 LAB — CBC
HEMATOCRIT: 29.7 % — AB (ref 35.0–47.0)
HEMOGLOBIN: 10.1 g/dL — AB (ref 12.0–16.0)
MCH: 28.9 pg (ref 26.0–34.0)
MCHC: 33.9 g/dL (ref 32.0–36.0)
MCV: 85.4 fL (ref 80.0–100.0)
Platelets: 627 10*3/uL — ABNORMAL HIGH (ref 150–440)
RBC: 3.47 MIL/uL — AB (ref 3.80–5.20)
RDW: 17.2 % — ABNORMAL HIGH (ref 11.5–14.5)
WBC: 16.4 10*3/uL — ABNORMAL HIGH (ref 3.6–11.0)

## 2017-02-19 LAB — PROTIME-INR
INR: 1.14
Prothrombin Time: 14.7 seconds (ref 11.4–15.2)

## 2017-02-19 LAB — BASIC METABOLIC PANEL
ANION GAP: 7 (ref 5–15)
BUN: 19 mg/dL (ref 6–20)
CHLORIDE: 106 mmol/L (ref 101–111)
CO2: 26 mmol/L (ref 22–32)
Calcium: 8.1 mg/dL — ABNORMAL LOW (ref 8.9–10.3)
Creatinine, Ser: 0.74 mg/dL (ref 0.44–1.00)
GFR calc Af Amer: >60 mL/min (ref >60)
GFR calc non Af Amer: >60 mL/min (ref >60)
GLUCOSE: 149 mg/dL — AB (ref 65–99)
POTASSIUM: 3.9 mmol/L (ref 3.5–5.1)
Sodium: 139 mmol/L (ref 135–145)

## 2017-02-19 LAB — APTT: APTT: 25 s (ref 24–36)

## 2017-02-19 SURGERY — FIXATION, FRACTURE, INTERTROCHANTERIC, WITH INTRAMEDULLARY ROD
Anesthesia: General | Site: Hip | Laterality: Right | Wound class: Clean

## 2017-02-19 MED ORDER — ACETAMINOPHEN 650 MG RE SUPP: 650.0000 mg | Freq: Four times a day (QID) | RECTAL | Status: DC | PRN

## 2017-02-19 MED ORDER — IPRATROPIUM-ALBUTEROL 0.5-2.5 (3) MG/3ML IN SOLN
RESPIRATORY_TRACT | Status: AC
  Administered 2017-02-19: 3 mL via RESPIRATORY_TRACT
  Filled 2017-02-19: qty 3

## 2017-02-19 MED ORDER — PHENYLEPHRINE HCL 10 MG/ML IJ SOLN
INTRAMUSCULAR | Status: DC | PRN
  Administered 2017-02-19 (×5): 100 ug via INTRAVENOUS

## 2017-02-19 MED ORDER — TRAMADOL HCL 50 MG PO TABS: 50.0000 mg | ORAL_TABLET | ORAL | Status: DC | PRN

## 2017-02-19 MED ORDER — ONDANSETRON HCL 4 MG PO TABS: 4.0000 mg | ORAL_TABLET | Freq: Four times a day (QID) | ORAL | Status: DC | PRN

## 2017-02-19 MED ORDER — PHENYLEPHRINE HCL 10 MG/ML IJ SOLN
INTRAVENOUS | Status: DC | PRN
  Administered 2017-02-19: 25 ug/min via INTRAVENOUS

## 2017-02-19 MED ORDER — NEOMYCIN-POLYMYXIN B GU 40-200000 IR SOLN
Status: DC | PRN
  Administered 2017-02-19: 6 mL

## 2017-02-19 MED ORDER — MAGNESIUM HYDROXIDE 400 MG/5ML PO SUSP
30.0000 mL | Freq: Every day | ORAL | Status: DC | PRN
  Administered 2017-02-20: 30 mL via ORAL
  Filled 2017-02-19: qty 30

## 2017-02-19 MED ORDER — INSULIN ASPART 100 UNIT/ML ~~LOC~~ SOLN
0.0000 [IU] | Freq: Three times a day (TID) | SUBCUTANEOUS | Status: DC
  Administered 2017-02-20 (×3): 2 [IU] via SUBCUTANEOUS
  Administered 2017-02-20: 1 [IU] via SUBCUTANEOUS
  Administered 2017-02-21 (×2): 2 [IU] via SUBCUTANEOUS
  Filled 2017-02-19 (×7): qty 1

## 2017-02-19 MED ORDER — ACETAMINOPHEN 10 MG/ML IV SOLN
INTRAVENOUS | Status: DC | PRN
  Administered 2017-02-19: 1000 mg via INTRAVENOUS

## 2017-02-19 MED ORDER — FENTANYL CITRATE (PF) 100 MCG/2ML IJ SOLN
INTRAMUSCULAR | Status: AC
  Administered 2017-02-19: 25 ug via INTRAVENOUS
  Filled 2017-02-19: qty 2

## 2017-02-19 MED ORDER — ACETAMINOPHEN 10 MG/ML IV SOLN
1000.0000 mg | Freq: Four times a day (QID) | INTRAVENOUS | Status: AC
  Administered 2017-02-19 – 2017-02-20 (×4): 1000 mg via INTRAVENOUS
  Filled 2017-02-19 (×4): qty 100

## 2017-02-19 MED ORDER — SODIUM CHLORIDE 0.9 % IV SOLN
INTRAVENOUS | Status: DC
  Administered 2017-02-19: 20:00:00 via INTRAVENOUS

## 2017-02-19 MED ORDER — METOCLOPRAMIDE HCL 5 MG/ML IJ SOLN: 5.0000 mg | Freq: Three times a day (TID) | INTRAMUSCULAR | Status: DC | PRN

## 2017-02-19 MED ORDER — PROPOFOL 10 MG/ML IV BOLUS
INTRAVENOUS | Status: DC | PRN
  Administered 2017-02-19: 60 mg via INTRAVENOUS

## 2017-02-19 MED ORDER — FENTANYL CITRATE (PF) 100 MCG/2ML IJ SOLN
25.0000 ug | INTRAMUSCULAR | Status: DC | PRN
  Administered 2017-02-19 (×4): 25 ug via INTRAVENOUS

## 2017-02-19 MED ORDER — ROCURONIUM BROMIDE 100 MG/10ML IV SOLN
INTRAVENOUS | Status: DC | PRN
  Administered 2017-02-19: 20 mg via INTRAVENOUS
  Administered 2017-02-19: 50 mg via INTRAVENOUS
  Administered 2017-02-19: 20 mg via INTRAVENOUS
  Administered 2017-02-19: 10 mg via INTRAVENOUS

## 2017-02-19 MED ORDER — DEXAMETHASONE SODIUM PHOSPHATE 4 MG/ML IJ SOLN
INTRAMUSCULAR | Status: DC | PRN
  Administered 2017-02-19: 5 mg via INTRAVENOUS

## 2017-02-19 MED ORDER — DEXAMETHASONE SODIUM PHOSPHATE 10 MG/ML IJ SOLN
INTRAMUSCULAR | Status: AC
  Filled 2017-02-19: qty 1

## 2017-02-19 MED ORDER — SUGAMMADEX SODIUM 200 MG/2ML IV SOLN
INTRAVENOUS | Status: AC
  Filled 2017-02-19: qty 2

## 2017-02-19 MED ORDER — SUCCINYLCHOLINE CHLORIDE 20 MG/ML IJ SOLN
INTRAMUSCULAR | Status: AC
  Filled 2017-02-19: qty 1

## 2017-02-19 MED ORDER — NEOMYCIN-POLYMYXIN B GU 40-200000 IR SOLN
Status: AC
  Filled 2017-02-19: qty 20

## 2017-02-19 MED ORDER — ROCURONIUM BROMIDE 50 MG/5ML IV SOLN
INTRAVENOUS | Status: AC
  Filled 2017-02-19: qty 1

## 2017-02-19 MED ORDER — FLEET ENEMA 7-19 GM/118ML RE ENEM: 1.0000 | ENEMA | Freq: Once | RECTAL | Status: DC | PRN

## 2017-02-19 MED ORDER — MORPHINE SULFATE (PF) 2 MG/ML IV SOLN: 2.0000 mg | INTRAVENOUS | Status: DC | PRN

## 2017-02-19 MED ORDER — CEFAZOLIN SODIUM-DEXTROSE 2-4 GM/100ML-% IV SOLN
2.0000 g | Freq: Four times a day (QID) | INTRAVENOUS | Status: AC
  Administered 2017-02-19 – 2017-02-20 (×4): 2 g via INTRAVENOUS
  Filled 2017-02-19 (×4): qty 100

## 2017-02-19 MED ORDER — PROPOFOL 10 MG/ML IV BOLUS
INTRAVENOUS | Status: AC
  Filled 2017-02-19: qty 20

## 2017-02-19 MED ORDER — SEVOFLURANE IN SOLN
RESPIRATORY_TRACT | Status: AC
  Filled 2017-02-19: qty 250

## 2017-02-19 MED ORDER — SUGAMMADEX SODIUM 200 MG/2ML IV SOLN
INTRAVENOUS | Status: DC | PRN
  Administered 2017-02-19: 130 mg via INTRAVENOUS

## 2017-02-19 MED ORDER — IPRATROPIUM-ALBUTEROL 0.5-2.5 (3) MG/3ML IN SOLN
3.0000 mL | Freq: Once | RESPIRATORY_TRACT | Status: AC
  Administered 2017-02-19: 3 mL via RESPIRATORY_TRACT

## 2017-02-19 MED ORDER — CEFAZOLIN SODIUM-DEXTROSE 2-4 GM/100ML-% IV SOLN
INTRAVENOUS | Status: AC
  Filled 2017-02-19: qty 100

## 2017-02-19 MED ORDER — MENTHOL 3 MG MT LOZG
1.0000 | LOZENGE | OROMUCOSAL | Status: DC | PRN
  Filled 2017-02-19: qty 9

## 2017-02-19 MED ORDER — LIDOCAINE HCL (CARDIAC) 20 MG/ML IV SOLN
INTRAVENOUS | Status: DC | PRN
  Administered 2017-02-19: 100 mg via INTRAVENOUS

## 2017-02-19 MED ORDER — CEFAZOLIN SODIUM-DEXTROSE 1-4 GM/50ML-% IV SOLN
INTRAVENOUS | Status: DC | PRN
  Administered 2017-02-19: 1 g via INTRAVENOUS

## 2017-02-19 MED ORDER — ACETAMINOPHEN 325 MG PO TABS: 650.0000 mg | ORAL_TABLET | Freq: Four times a day (QID) | ORAL | Status: DC | PRN

## 2017-02-19 MED ORDER — LACTATED RINGERS IV SOLN
INTRAVENOUS | Status: DC | PRN
  Administered 2017-02-19 (×2): via INTRAVENOUS

## 2017-02-19 MED ORDER — METOCLOPRAMIDE HCL 10 MG PO TABS
10.0000 mg | ORAL_TABLET | Freq: Three times a day (TID) | ORAL | Status: AC
  Administered 2017-02-20 – 2017-02-21 (×7): 10 mg via ORAL
  Filled 2017-02-19 (×8): qty 1

## 2017-02-19 MED ORDER — CEFAZOLIN SODIUM 1 G IJ SOLR
INTRAMUSCULAR | Status: AC
  Filled 2017-02-19: qty 10

## 2017-02-19 MED ORDER — ONDANSETRON HCL 4 MG/2ML IJ SOLN: 4.0000 mg | Freq: Four times a day (QID) | INTRAMUSCULAR | Status: DC | PRN

## 2017-02-19 MED ORDER — FENTANYL CITRATE (PF) 250 MCG/5ML IJ SOLN
INTRAMUSCULAR | Status: AC
  Filled 2017-02-19: qty 5

## 2017-02-19 MED ORDER — SENNOSIDES-DOCUSATE SODIUM 8.6-50 MG PO TABS
1.0000 | ORAL_TABLET | Freq: Two times a day (BID) | ORAL | Status: DC
  Administered 2017-02-20: 1 via ORAL
  Filled 2017-02-19 (×2): qty 1

## 2017-02-19 MED ORDER — BISACODYL 10 MG RE SUPP
10.0000 mg | Freq: Every day | RECTAL | Status: DC | PRN
  Filled 2017-02-19: qty 1

## 2017-02-19 MED ORDER — OXYCODONE HCL 5 MG PO TABS
5.0000 mg | ORAL_TABLET | ORAL | Status: DC | PRN
  Administered 2017-02-20 – 2017-02-22 (×5): 5 mg via ORAL
  Filled 2017-02-19 (×5): qty 1

## 2017-02-19 MED ORDER — CEFAZOLIN SODIUM-DEXTROSE 2-3 GM-% IV SOLR
INTRAVENOUS | Status: DC | PRN
  Administered 2017-02-19: 2 g via INTRAVENOUS

## 2017-02-19 MED ORDER — BUPIVACAINE HCL (PF) 0.25 % IJ SOLN
INTRAMUSCULAR | Status: DC | PRN
  Administered 2017-02-19: 10 mL

## 2017-02-19 MED ORDER — METOCLOPRAMIDE HCL 10 MG PO TABS: 5.0000 mg | ORAL_TABLET | Freq: Three times a day (TID) | ORAL | Status: DC | PRN

## 2017-02-19 MED ORDER — ONDANSETRON HCL 4 MG/2ML IJ SOLN
INTRAMUSCULAR | Status: DC | PRN
  Administered 2017-02-19: 4 mg via INTRAVENOUS

## 2017-02-19 MED ORDER — LIDOCAINE HCL (PF) 2 % IJ SOLN
INTRAMUSCULAR | Status: AC
  Filled 2017-02-19: qty 2

## 2017-02-19 MED ORDER — SUCCINYLCHOLINE CHLORIDE 20 MG/ML IJ SOLN
INTRAMUSCULAR | Status: DC | PRN
  Administered 2017-02-19: 100 mg via INTRAVENOUS

## 2017-02-19 MED ORDER — SEVOFLURANE IN SOLN
RESPIRATORY_TRACT | Status: AC
Start: 2017-02-19 — End: 2017-02-19
  Filled 2017-02-19: qty 250

## 2017-02-19 MED ORDER — PANTOPRAZOLE SODIUM 40 MG PO TBEC
40.0000 mg | DELAYED_RELEASE_TABLET | Freq: Two times a day (BID) | ORAL | Status: DC
  Administered 2017-02-20 – 2017-02-22 (×5): 40 mg via ORAL
  Filled 2017-02-19 (×6): qty 1

## 2017-02-19 MED ORDER — BUPIVACAINE HCL (PF) 0.25 % IJ SOLN
INTRAMUSCULAR | Status: AC
  Filled 2017-02-19: qty 60

## 2017-02-19 MED ORDER — FENTANYL CITRATE (PF) 100 MCG/2ML IJ SOLN
INTRAMUSCULAR | Status: DC | PRN
  Administered 2017-02-19: 25 ug via INTRAVENOUS
  Administered 2017-02-19: 50 ug via INTRAVENOUS
  Administered 2017-02-19 (×2): 75 ug via INTRAVENOUS
  Administered 2017-02-19: 25 ug via INTRAVENOUS

## 2017-02-19 MED ORDER — FERROUS SULFATE 325 (65 FE) MG PO TABS
325.0000 mg | ORAL_TABLET | Freq: Two times a day (BID) | ORAL | Status: DC
  Administered 2017-02-20 – 2017-02-22 (×5): 325 mg via ORAL
  Filled 2017-02-19 (×5): qty 1

## 2017-02-19 MED ORDER — PHENOL 1.4 % MT LIQD
1.0000 | OROMUCOSAL | Status: DC | PRN
  Filled 2017-02-19: qty 177

## 2017-02-19 MED ORDER — ENOXAPARIN SODIUM 40 MG/0.4ML ~~LOC~~ SOLN
40.0000 mg | SUBCUTANEOUS | Status: DC
  Administered 2017-02-20 – 2017-02-22 (×3): 40 mg via SUBCUTANEOUS
  Filled 2017-02-19 (×3): qty 0.4

## 2017-02-19 MED ORDER — KETAMINE HCL 50 MG/ML IJ SOLN
INTRAMUSCULAR | Status: DC | PRN
  Administered 2017-02-19: 30 mg via INTRAMUSCULAR

## 2017-02-19 SURGICAL SUPPLY — 79 items
BANDAGE ELASTIC 4 LF NS (GAUZE/BANDAGES/DRESSINGS) ×8 IMPLANT
BIT DRILL 2.5X2.75 QC CALB (BIT) ×4 IMPLANT
BIT DRILL CALIBRATED 2.7 (BIT) ×3 IMPLANT
BIT DRILL CALIBRATED 2.7MM (BIT) ×1
BLADE HELICAL 95 (Orthopedic Implant) ×3 IMPLANT
BLADE HELICAL 95MM (Orthopedic Implant) ×1 IMPLANT
BNDG COHESIVE 4X5 TAN STRL (GAUZE/BANDAGES/DRESSINGS) ×4 IMPLANT
BNDG ESMARK 4X12 TAN STRL LF (GAUZE/BANDAGES/DRESSINGS) ×4 IMPLANT
CANISTER SUCT 1200ML W/VALVE (MISCELLANEOUS) ×4 IMPLANT
CLOSURE WOUND 1/2 X4 (GAUZE/BANDAGES/DRESSINGS) ×1
COOLER POLAR GLACIER W/PUMP (MISCELLANEOUS) ×4 IMPLANT
CUFF TOURN 18 STER (MISCELLANEOUS) ×4 IMPLANT
CUFF TOURN 24 STER (MISCELLANEOUS) IMPLANT
CUFF TOURN SGL QUICK 24 (TOURNIQUET CUFF)
CUFF TRNQT CYL 24X4X40X1 (TOURNIQUET CUFF) IMPLANT
DRAPE C-ARMOR (DRAPES) ×4 IMPLANT
DRAPE FLUOR MINI C-ARM 54X84 (DRAPES) ×4 IMPLANT
DRAPE IMP U-DRAPE 54X76 (DRAPES) ×4 IMPLANT
DRAPE POUCH INSTRU U-SHP 10X18 (DRAPES) ×4 IMPLANT
DRAPE SHEET LG 3/4 BI-LAMINATE (DRAPES) ×4 IMPLANT
DRAPE TABLE BACK 80X90 (DRAPES) ×4 IMPLANT
DRSG DERMACEA 8X12 NADH (GAUZE/BANDAGES/DRESSINGS) ×4 IMPLANT
DRSG OPSITE POSTOP 3X4 (GAUZE/BANDAGES/DRESSINGS) ×8 IMPLANT
DRSG OPSITE POSTOP 4X12 (GAUZE/BANDAGES/DRESSINGS) ×4 IMPLANT
DURAPREP 26ML APPLICATOR (WOUND CARE) ×4 IMPLANT
ELECT CAUTERY BLADE 6.4 (BLADE) ×4 IMPLANT
ELECT REM PT RETURN 9FT ADLT (ELECTROSURGICAL) ×4
ELECTRODE REM PT RTRN 9FT ADLT (ELECTROSURGICAL) ×2 IMPLANT
GAUZE SPONGE 4X4 12PLY STRL (GAUZE/BANDAGES/DRESSINGS) ×4 IMPLANT
GLOVE BIOGEL M STRL SZ7.5 (GLOVE) ×12 IMPLANT
GLOVE INDICATOR 8.0 STRL GRN (GLOVE) ×12 IMPLANT
GOWN STRL REUS W/ TWL LRG LVL3 (GOWN DISPOSABLE) ×8 IMPLANT
GOWN STRL REUS W/TWL LRG LVL3 (GOWN DISPOSABLE) ×8
HOLDER FOLEY CATH W/STRAP (MISCELLANEOUS) ×4 IMPLANT
K-WIRE 2X5 SS THRDED S3 (WIRE) ×8
KIT RM TURNOVER CYSTO AR (KITS) ×4 IMPLANT
KIT RM TURNOVER STRD PROC AR (KITS) ×4 IMPLANT
KWIRE 2X5 SS THRDED S3 (WIRE) ×4 IMPLANT
LOOP RED MAXI  1X406MM (MISCELLANEOUS) ×2
LOOP VESSEL MAXI 1X406 RED (MISCELLANEOUS) ×2 IMPLANT
MAT BLUE FLOOR 46X72 FLO (MISCELLANEOUS) ×4 IMPLANT
NAIL TROCHANTERIC 11MM 130D (Nail) ×4 IMPLANT
NS IRRIG 1000ML POUR BTL (IV SOLUTION) ×4 IMPLANT
NS IRRIG 500ML POUR BTL (IV SOLUTION) ×4 IMPLANT
PACK EXTREMITY ARMC (MISCELLANEOUS) ×4 IMPLANT
PACK HIP COMPR (MISCELLANEOUS) ×4 IMPLANT
PAD ABD DERMACEA PRESS 5X9 (GAUZE/BANDAGES/DRESSINGS) ×8 IMPLANT
PAD CAST CTTN 4X4 STRL (SOFTGOODS) ×6 IMPLANT
PAD PREP 24X41 OB/GYN DISP (PERSONAL CARE ITEMS) IMPLANT
PAD WRAPON POLAR KNEE (MISCELLANEOUS) IMPLANT
PADDING CAST COTTON 4X4 STRL (SOFTGOODS) ×6
PLATE OLECRANON SM (Plate) ×4 IMPLANT
REAMER ROD DEEP FLUTE 2.5X950 (INSTRUMENTS) ×4 IMPLANT
SCREW CORTICAL LOW PROF 3.5X20 (Screw) ×4 IMPLANT
SCREW LOCK CORT STAR 3.5X14 (Screw) ×4 IMPLANT
SCREW LOCK CORT STAR 3.5X16 (Screw) ×16 IMPLANT
SCREW LOCK TI 5.0X50 F/IM NAIL (Screw) ×4 IMPLANT
SCREW LP 3.5 (Screw) ×4 IMPLANT
SOL PREP PVP 2OZ (MISCELLANEOUS) ×4
SOLUTION PREP PVP 2OZ (MISCELLANEOUS) ×2 IMPLANT
SPLINT CAST 1 STEP 4X30 (MISCELLANEOUS) ×4 IMPLANT
SPLINT CAST 1 STEP 5X30 WHT (MISCELLANEOUS) IMPLANT
SPONGE LAP 18X18 5 PK (GAUZE/BANDAGES/DRESSINGS) ×4 IMPLANT
STAPLER SKIN PROX 35W (STAPLE) ×4 IMPLANT
STOCKINETTE IMPERVIOUS 9X36 MD (GAUZE/BANDAGES/DRESSINGS) ×4 IMPLANT
STRIP CLOSURE SKIN 1/2X4 (GAUZE/BANDAGES/DRESSINGS) ×3 IMPLANT
SUCTION FRAZIER HANDLE 10FR (MISCELLANEOUS) ×2
SUCTION TUBE FRAZIER 10FR DISP (MISCELLANEOUS) ×2 IMPLANT
SUT ETHILON 3-0 FS-10 30 BLK (SUTURE) ×4
SUT STEEL 7 (SUTURE) ×4 IMPLANT
SUT VIC AB 0 CT1 36 (SUTURE) ×4 IMPLANT
SUT VIC AB 1 CT1 36 (SUTURE) ×4 IMPLANT
SUT VIC AB 2-0 CT1 27 (SUTURE) ×2
SUT VIC AB 2-0 CT1 TAPERPNT 27 (SUTURE) ×2 IMPLANT
SUT VIC AB 2-0 CT2 27 (SUTURE) ×4 IMPLANT
SUTURE EHLN 3-0 FS-10 30 BLK (SUTURE) ×2 IMPLANT
TAPE MICROFOAM 4IN (TAPE) ×4 IMPLANT
WASHER 3.5MM (Orthopedic Implant) ×4 IMPLANT
WRAPON POLAR PAD KNEE (MISCELLANEOUS)

## 2017-02-19 NOTE — Progress Notes (Signed)
Pts. fsbs is 258. Dr. Anne Hahn notified and he's putting in new orders.

## 2017-02-19 NOTE — Anesthesia Preprocedure Evaluation (Signed)
Anesthesia Evaluation  Patient identified by MRN, date of birth, ID band Patient awake    Reviewed: Allergy & Precautions, H&P , NPO status , Patient's Chart, lab work & pertinent test results  History of Anesthesia Complications Negative for: history of anesthetic complications  Airway Mallampati: III  TM Distance: <3 FB Neck ROM: limited    Dental  (+) Poor Dentition, Chipped, Caps   Pulmonary COPD,  COPD inhaler and oxygen dependent, former smoker,           Cardiovascular Exercise Tolerance: Poor hypertension, (-) angina+ Peripheral Vascular Disease  (-) Past MI      Neuro/Psych PSYCHIATRIC DISORDERS negative neurological ROS     GI/Hepatic Neg liver ROS, GERD  Controlled,  Endo/Other  diabetes, Type 2  Renal/GU      Musculoskeletal  (+) Arthritis ,   Abdominal   Peds  Hematology negative hematology ROS (+)   Anesthesia Other Findings Past Medical History: No date: Anxiety No date: Chronic lower back pain 11/2012: Closed compression fracture of L1 lumbar vertebra (HCC) No date: Collagen vascular disease (HCC) No date: COPD (chronic obstructive pulmonary disease) (HCC) No date: DDD (degenerative disc disease) No date: Depression 09/27/2016: Fall     Comment:  Fall Feb 04, 2016 with broken vertebra No date: GERD (gastroesophageal reflux disease) No date: Hypertension No date: Rheumatoid arthritis (HCC)  Past Surgical History: No date: BACK SURGERY No date: BREAST BIOPSY; Left     Comment:  negative No date: CATARACT EXTRACTION W/ INTRAOCULAR LENS  IMPLANT, BILATERAL;  Bilateral 09/15/2014: FINGER ARTHROPLASTY; Right     Comment:  Procedure: RIGHT MIDDLE FINGER, RING FINGER, SMALL               FINGER EXTENSOR DIGITORUM COMMUMIS STABILIZATION WITH               RIGHT MIDDLE FINGER MCP ARTHROPLASTY, POSSIBLE RING               FINGER AND SMALL FINGER MCP ARTHROPLASTY;  Surgeon:               Dominica Severin, MD;  Location: [facility] OR;  Service:               Orthopedics;  Laterality: Right; No date: FOOT SURGERY 09/15/2014: HAND TENDON SURGERY; Right     Comment:  "d/t RA" 01/04/2013: KYPHOPLASTY; N/A     Comment:  Procedure: L1 Kyphoplasty;  Surgeon: Hewitt Shorts,               MD;  Location: [facility];  Service: Neurosurgery;                Laterality: N/A;  L1 Kyphoplasty 02/07/2014: KYPHOPLASTY; N/A     Comment:  Procedure: THORACIC SIX KYPHOPLASTY;  Surgeon: Hewitt Shorts, MD;  Location: [facility];  Service:               Neurosurgery;  Laterality: N/A;  T6 Kyphoplasty  02/08/2016: KYPHOPLASTY; N/A     Comment:  Procedure: KYPHOPLASTY  T-12;  Surgeon: Kennedy Bucker,               MD;  Location: [facility] ORS;  Service: Orthopedics;                Laterality: N/A; 10/01/2016: KYPHOPLASTY; N/A     Comment:  Procedure: KYPHOPLASTY  L-5;  Surgeon: Kennedy Bucker, MD;  Location: [facility] ORS;  Service: Orthopedics;  Laterality:               N/A; No date: LUMBAR DISC SURGERY     Comment:   X 3 12/07/2015: PERIPHERAL VASCULAR CATHETERIZATION; Left     Comment:  Procedure: Upper Extremity Angiography;  Surgeon: Annice Needy, MD;  Location: [facility] IN[facility]ASIVE [facility];  Service:               Cardiovascular;  Laterality: Left; 12/07/2015: PERIPHERAL VASCULAR CATHETERIZATION     Comment:  Procedure: Upper Extremity Intervention;  Surgeon: Annice Needy, MD;  Location: [facility] INV[facility] CV [facility];  Service:               Cardiovascular;; No date: POSTERIOR LUMBAR FUSION     Comment:  "got screws in" No date: TONSILLECTOMY No date: TUBAL LIGATION No date: WRIST FUSION; Right     Comment:  30 yrs ago  BMI    Body Mass Index:  25.08 kg/m      Reproductive/Obstetrics negative OB ROS                             Anesthesia Physical Anesthesia Plan  ASA: III  Anesthesia Plan: General ETT   Post-op Pain Management:     Induction: Intravenous  PONV Risk Score and Plan: 3 and Ondansetron, Dexamethasone, Propofol and Treatment may vary due to age or medical condition  Airway Management Planned: Oral ETT and Video Laryngoscope Planned  Additional Equipment:   Intra-op Plan:   Post-operative Plan: Extubation in OR and Possible Post-op intubation/ventilation  Informed Consent: I have reviewed the patients History and Physical, chart, labs and discussed the procedure including the risks, benefits and alternatives for the proposed anesthesia with the patient or authorized representative who has indicated his/her understanding and acceptance.   Dental Advisory Given  Plan Discussed with: Anesthesiologist, CRNA and Surgeon  Anesthesia Plan Comments: (Patient consented for risks of anesthesia including but not limited to:  - adverse reactions to medications - damage to teeth, lips or other oral mucosa - sore throat or hoarseness - Damage to heart, brain, lungs or loss of life  Patient voiced understanding.)        Anesthesia Quick Evaluation

## 2017-02-19 NOTE — Op Note (Addendum)
OPERATIVE NOTE  DATE OF SURGERY:   02/19/2017  PATIENT NAME:  Michelle Cabrera   DOB: Dec 10, 1945  MRN: [id number]  PRE-OPERATIVE DIAGNOSIS: Right intertrochanteric femur fracture Displaced right olecranon fracture  POST-OPERATIVE DIAGNOSIS:  Same  PROCEDURE:  Open reduction and internal fixation of a right intertrochanteric femur fracture  Open reduction and internal fixation of the right olecranon fracture  SURGEON:  Jena Gauss. M.D.  ANESTHESIA: general  ESTIMATED BLOOD LOSS: 200 mL  FLUIDS REPLACED: 1900 mL of crystalloid  DRAINS: None  IMPLANTS UTILIZED: Synthes 11 mm x 367mm/130 trochanteric fixation nail, 95 mm helical blade, 50 mm x 5.0 mm locking screw; Biomet olecranon plate 32.9 mm locking screws, 23.5 mm nonlocking screws, and a 3.5 mm washer  INDICATIONS FOR SURGERY: Michelle Cabrera is a 71 y.o. year old female who fell and sustained a displaced right intertrochanteric femur fracture and a displaced right olecranon fracture. After discussion of the risks and benefits of surgical intervention, the patient expressed understanding of the risks benefits and agree with plans for open reduction and internal fixation.   The risks, benefits, and alternatives were discussed at length including but not limited to the risks of infection, bleeding, nerve injury, stiffness, blood clots, the need for revision surgery, limb length inequality, cardiopulmonary complications, among others, and they were willing to proceed.  PROCEDURE IN DETAIL: The patient was brought into the operating room and, after adequate general anesthesia was achieved, patient was placed on the fracture table. All bony prominences were well-padded. The right lower extremity was placed in traction and a provisional reduction was performed and verified using the C-arm. The patient's right hip and leg were cleaned and prepped with alcohol and DuraPrep and draped in the usual sterile fashion. A "timeout" was  performed as per usual protocol. A lateral incision was made extended from the proximal portion of the greater trochanter proximally. The fascia was incised in line with the skin incision and the fibers of the hip abductors were split in line. The tip of the greater trochanter was palpated and a distally threaded guide pin was inserted into the tip of the greater trochanter and advanced into the medullary canal. Position was confirmed in both AP and lateral planes using the C-arm. A pilot hole was enlarged using a step drill. A distally debrided reaming guidewire was then inserted down the femoral canal and measurements were obtained to determine nail length. A 380 mm long nail was selected. The canal was reamed in sequential fashion up to a 12 mm diameter. A Synthes 11 mm by 380 mm/130 trochanteric fixation nail was advanced over the guidepin and position confirmed using the C-arm. A second stab incision was made and the tissue protector was inserted through the outrigger device and advanced to the lateral cortex of the femur. A threaded screw guide pin was inserted into the femoral neck and head and position was again confirmed in both AP and lateral planes. Vision was were obtained and it was felt that a 95 mm helical blade was appropriate. The cortex was reamed and then a cannulated reamer was advanced over the guidepin to the appropriate depth. A 95 mm helical blade was then advanced over the guidepin and impacted into place. Good position was noted in multiple planes using the C-arm. The locking sleeve was engaged. Finally, the C-arm was positioned so as to visualize the distal locking sites. A 50 mm x 5.0 mm distal locking screw was placed. The outrigger device was removed.  The hip was visualized in all planes using the C-arm with good reduction appreciated and good position of the hardware noted.  The wound was irrigated with copious amounts of normal saline with antibiotic solution and suctioned dry.  Good hemostasis was appreciated. The fascia was reapproximated using interrupted sutures of #1 Vicryl. Subcutaneous tissue was approximated layers using first #0 Vicryl followed #2-0 Vicryl. The skin was closed with skin staples. A sterile dressing was applied.  Next, the patient was transferred to a standard table with a beanbag. The patient was positioned in a left lateral decubitus position and the forearm was supported using an armrest. All bony prominences were well-padded. The patient's right elbow and arm were cleaned and prepped with alcohol and DuraPrep and draped in the usual fashion. A sterile tourniquet was placed on the upper arm but was not inflated. A second timeout was performed. A posterior longitudinal incision was made extending around the radial aspect of the olecranon and down the ulnar shaft. Dissection was carried down to the fracture site. Hematoma and soft tissue was removed from the fracture site using curettes and the site was irrigated. A provisional reduction was performed and maintained using bone reduction forceps with points. Position was confirmed using FluoroScan. Next, a Biomet olecranon plate was positioned and provisionally using 2 K wires. Position was noted. 23.5 mm nonlocking screws were inserted into the proximal portion of the olecranon. Next, a nonlocking 3.5 mm screw with a washer was inserted through the slotted hole to allow for compression at the fracture site. The distal portion plate was further secured using 3 additional nonlocking screws. Finally, the "home run" screw position was drilled and measured. The 3.5 mm nonlocking screw was inserted so as to engage both cortices. Good position of the hardware was appreciated reduction achieved as visualized using the FluoroScan. The wound was irrigated with copious amounts of saline with MRI solution. The skin edges were reapproximated using first interrupted sutures of #0 Vicryl. This was followed by apposition of #2-0  Vicryl. Skin was closed with skin staples. A sterile dressing was applied followed by application of a posterior splint.  The patient tolerated the procedure well and was transported to the recovery room in stable condition.   Jena Gauss., M.D.

## 2017-02-19 NOTE — Transfer of Care (Signed)
Immediate Anesthesia Transfer of Care Note  Patient: CHANDLER STOFER  Procedure(s) Performed: Procedure(s): INTRAMEDULLARY (IM) NAIL INTERTROCHANTRIC (Right) OPEN REDUCTION INTERNAL FIXATION (ORIF) ELBOW/OLECRANON FRACTURE (Right)  Patient Location: PACU  Anesthesia Type:General  Level of Consciousness: sedated  Airway & Oxygen Therapy: Patient Spontanous Breathing and Patient connected to nasal cannula oxygen  Post-op Assessment: Report given to RN and Post -op Vital signs reviewed and stable  Post vital signs: Reviewed and stable  Last Vitals:  Vitals:   02/19/17 1325 02/19/17 1933  BP: 135/79 (!) 141/85  Pulse: (!) 110 98  Resp: 20 20  Temp: 36.8 C (!) 36.2 C    Last Pain:  Vitals:   02/19/17 1933  TempSrc:   PainSc: (P) Asleep      Patients Stated Pain Goal: 3 (02/19/17 1103)  Complications: No apparent anesthesia complications

## 2017-02-19 NOTE — Progress Notes (Signed)
PASARR has been received, [id number], expiration date 03/21/2017.   [employer], LCSW [phone]

## 2017-02-19 NOTE — Anesthesia Procedure Notes (Signed)
Procedure Name: Intubation Date/Time: 02/19/2017 2:28 PM Performed by: Rosaria Ferries, Shauntay Brunelli Pre-anesthesia Checklist: Emergency Drugs available, Patient identified, Suction available and Patient being monitored Patient Re-evaluated:Patient Re-evaluated prior to induction Oxygen Delivery Method: Circle system utilized Preoxygenation: Pre-oxygenation with 100% oxygen Induction Type: IV induction Laryngoscope Size: Mac and 3 Grade View: Grade II Tube type: Oral Tube size: 7.0 mm Number of attempts: 1 Airway Equipment and Method: Bougie stylet Placement Confirmation: positive ETCO2 and breath sounds checked- equal and bilateral Secured at: 22 cm Tube secured with: Tape Dental Injury: Teeth and Oropharynx as per pre-operative assessment  Difficulty Due To: Difficulty was anticipated, Difficult Airway- due to anterior larynx and Difficult Airway- due to reduced neck mobility Future Recommendations: Recommend- induction with short-acting agent, and alternative techniques readily available

## 2017-02-19 NOTE — Progress Notes (Signed)
Pt. Back from surgery. VSS. Pt. Confused and combative. Refused all medication. Foley patent. Neurochecks WDL. Pt. Will not keep telemetry box on. Dressings with small amount of drainage at this time.

## 2017-02-19 NOTE — Progress Notes (Signed)
From telemetry, patient had SVT in 140's. Patient's heart rate back to the 100's. MD Esaw Grandchild notified.  Stephannie Peters, RN

## 2017-02-19 NOTE — Anesthesia Post-op Follow-up Note (Cosign Needed)
Anesthesia QCDR form completed.        

## 2017-02-20 ENCOUNTER — Encounter: Payer: Self-pay | Admitting: Orthopedic Surgery

## 2017-02-20 ENCOUNTER — Encounter
Admission: RE | Admit: 2017-02-20 | Discharge: 2017-02-20 | Disposition: A | Discharge status: Home / Self Care | Payer: Medicare Other | Source: Ambulatory Visit | Attending: Internal Medicine | Admitting: Internal Medicine

## 2017-02-20 LAB — BASIC METABOLIC PANEL
ANION GAP: 6 (ref 5–15)
BUN: 11 mg/dL (ref 6–20)
CHLORIDE: 108 mmol/L (ref 101–111)
CO2: 25 mmol/L (ref 22–32)
Calcium: 7 mg/dL — ABNORMAL LOW (ref 8.9–10.3)
Creatinine, Ser: 0.52 mg/dL (ref 0.44–1.00)
GFR calc Af Amer: >60 mL/min (ref >60)
GFR calc non Af Amer: >60 mL/min (ref >60)
GLUCOSE: 210 mg/dL — AB (ref 65–99)
Potassium: 3.6 mmol/L (ref 3.5–5.1)
Sodium: 139 mmol/L (ref 135–145)

## 2017-02-20 LAB — CBC
HEMATOCRIT: 25 % — AB (ref 35.0–47.0)
Hemoglobin: 8.2 g/dL — ABNORMAL LOW (ref 12.0–16.0)
MCH: 28.5 pg (ref 26.0–34.0)
MCHC: 32.7 g/dL (ref 32.0–36.0)
MCV: 87.2 fL (ref 80.0–100.0)
Platelets: 542 10*3/uL — ABNORMAL HIGH (ref 150–440)
RBC: 2.86 MIL/uL — ABNORMAL LOW (ref 3.80–5.20)
RDW: 17.2 % — AB (ref 11.5–14.5)
WBC: 16.7 10*3/uL — AB (ref 3.6–11.0)

## 2017-02-20 LAB — GLUCOSE, CAPILLARY
GLUCOSE-CAPILLARY: 177 mg/dL — AB (ref 65–99)
Glucose-Capillary: 172 mg/dL — ABNORMAL HIGH (ref 65–99)
Glucose-Capillary: 161 mg/dL — ABNORMAL HIGH (ref 65–99)
Glucose-Capillary: 130 mg/dL — ABNORMAL HIGH (ref 65–99)

## 2017-02-20 LAB — MAGNESIUM: Magnesium: 1.2 mg/dL — ABNORMAL LOW (ref 1.7–2.4)

## 2017-02-20 MED ORDER — MAGNESIUM SULFATE 2 GM/50ML IV SOLN
2.0000 g | Freq: Once | INTRAVENOUS | Status: AC
  Administered 2017-02-20: 2 g via INTRAVENOUS
  Filled 2017-02-20: qty 50

## 2017-02-20 MED ORDER — SENNOSIDES-DOCUSATE SODIUM 8.6-50 MG PO TABS
1.0000 | ORAL_TABLET | Freq: Two times a day (BID) | ORAL | Status: DC
  Administered 2017-02-20 – 2017-02-22 (×5): 1 via ORAL
  Filled 2017-02-20 (×5): qty 1

## 2017-02-20 NOTE — Progress Notes (Signed)
Inpatient Diabetes Program Recommendations  AACE/ADA: New Consensus Statement on Inpatient Glycemic Control (2015)  Target Ranges:  Prepandial:   less than 140 mg/dL      Peak postprandial:   less than 180 mg/dL (1-2 hours)      Critically ill patients:  140 - 180 mg/dL  Results for Michelle Cabrera, Michelle Cabrera (MRN [id number]) as of 02/20/2017 10:15  Ref. Range 02/19/2017 07:50 02/19/2017 11:48 02/19/2017 13:39 02/19/2017 19:38 02/19/2017 21:30 02/20/2017 07:34  Glucose-Capillary Latest Ref Range: 65 - 99 mg/dL 017 (H) 793 (H) 903 (H) 192 (H) 258 (H) 177 (H)  Results for Michelle Cabrera, Michelle Cabrera (MRN [id number]) as of 02/20/2017 10:15  Ref. Range 02/18/2017 01:40  Glucose Latest Ref Range: 65 - 99 mg/dL 622 (H)   Results for Michelle Cabrera, Michelle Cabrera (MRN [id number]) as of 02/20/2017 10:15  Ref. Range 07/29/2015 07:21 09/06/2015 05:55  Hemoglobin A1C Latest Ref Range: 4.0 - 6.0 % 8.0 (H) 11.5 (H)    Review of Glycemic Control  Diabetes history: DM2 (per chart review, dx during hospital admission in February 2017) Outpatient Diabetes medications: None noted on home med list Current orders for Inpatient glycemic control: Novolog 0-9 units TID with meals  Inpatient Diabetes Program Recommendations: HgbA1C: Current A1C in process.  Outpatient DM medication: Depending on A1C results, MD may want to consider ordering outpatient DM medication at time of discharge.  NOTE: In reviewing chart, noted patient was hospitalized from 09/06/15 to 09/08/15 and patient was newly dx with DM at that time. Per discharge summary on 09/07/16, patient was discharged on Lantus and Novolog and was seen by Diabetes Coordinator on 09/08/15 (see note for details). Per home medication list, there are no DM medications listed at this time. Initial glucose 245 mg/dl on 5/63/89. Recommend MD add DM to medical history.  Will continue to follow as an inpatient.  Thanks, Orlando Penner, RN, MSN, CDE Diabetes Coordinator Inpatient Diabetes Program [phone] (Team  Pager from 8am to 5pm)

## 2017-02-20 NOTE — Consult Note (Signed)
ORTHOPAEDICS PROGRESS NOTE  PATIENT NAME: Michelle Cabrera DOB: 29-Jun-1946  MRN: [id number]  POD # 1: Open reduction internal fixation right intertrochanteric femur fracture Open reduction internal fixation of right olecranon fracture  Subjective: Patient is sleeping soundly and is difficult to arouse. Nursing informs me that she was somewhat combative last night and refusing her medications.  Objective: Vital signs in last 24 hours: Temp:  [97.1 F (36.2 C)-98.8 F (37.1 C)] 98.1 F (36.7 C) (07/26 0422) Pulse Rate:  [93-122] 93 (07/26 0422) Resp:  [14-20] 18 (07/26 0422) BP: (95-141)/(45-99) 114/75 (07/26 0422) SpO2:  [93 %-99 %] 94 % (07/26 0422) FiO2 (%):  [28 %] 28 % (07/25 2113)  Intake/Output from previous day: 07/25 0701 - 07/26 0700 In: 3897.9 [I.V.:3497.9; IV Piggyback:400] Out: 1750 [Urine:1550; Blood:200]   Recent Labs  02/18/17 0140 02/18/17 0555 02/19/17 0446 02/19/17 0910 02/20/17 0440  WBC 18.6* 23.8* 16.4*  --  16.7*  HGB 11.5* 11.2* 10.1*  --  8.2*  HCT 33.4* 33.3* 29.7*  --  25.0*  PLT 662* 666* 627*  --  542*  K 2.8* 2.8* 3.9  --  3.6  CL 99* 98* 106  --  108  CO2 26 27 26   --  25  BUN 27* 26* 19  --  11  CREATININE 1.04* 0.90 0.74  --  0.52  GLUCOSE 245* 152* 149*  --  210*  CALCIUM 8.1* 8.0* 8.1*  --  7.0*  INR  --   --   --  1.14  --     EXAM General: Elderly, frail-appearing female seen sleeping soundly. Lungs: clear to auscultation Cardiac: normal rate and regular rhythm Right upper extremity: Posterior splint is in place. Dressing is dry and clean. No significant swelling to the hand or digits. Ecchymosis is noted to the upper arm. Right lower extremity: Scant amount of drainage on the dressings. No significant swelling to the thigh. No swelling or tenderness to the knee. Neurologic: The patient was difficult to arouse. Sensory and motor function could not be a assessed due to the patient's level of sedation.  Assessment: ORIF of  right intertrochanteric femur fracture ORIF of her right olecranon fracture  Secondary diagnoses: Rheumatoid arthritis Hypertension Gastroesophageal reflux disease Depression Degenerative disc disease COPD History of vertebral compression fractures History of multiple lumbar surgeries with chronic pain  Plan: Continue cold therapy to the elbow and hip. Begin physical therapy and occupational therapy. The patient may be weightbearing as tolerated to the right lower extremity, but may require a forearm extension for her walker to accommodate her olecranon fracture. Plan is to go Skilled nursing facility after hospital stay. DVT Prophylaxis - Lovenox, Foot Pumps and TED hose  James P. M.D.

## 2017-02-20 NOTE — Evaluation (Signed)
Physical Therapy Evaluation Patient Details Name: Michelle Cabrera MRN: [id number] DOB: 1946-05-20 Today's Date: 02/20/2017   History of Present Illness  71 y/o female here after fall at home suffering R hip fx and subsequent ORIF.  Pt also suffered R elbow fx.  Clinical Impression  Pt struggled with all aspects of PT exam and though she was able to marginally participate she tolerated essentially no ROM/strength activities with the R LE, needed heavy assist with mobility to EOB and needed max assist to get to the recliner.  Pt with some confusion and excessive pain, in the R hip as well as the R elbow.  Pt able to put some weight though forearm via platform walker, but again stuggled with all aspects of PT.     Follow Up Recommendations SNF    Equipment Recommendations   (R platform walker)    Recommendations for Other Services       Precautions / Restrictions Precautions Precautions: Fall Restrictions LLE Weight Bearing: Weight bearing as tolerated      Mobility  Bed Mobility Overal bed mobility: Needs Assistance Bed Mobility: Supine to Sit     Supine to sit: Max assist     General bed mobility comments: Pt unable to assist with supine to sit, calling out in pain the entire transition  Transfers Overall transfer level: Needs assistance Equipment used: Right platform walker;Rolling walker (2 wheeled) Transfers: Sit to/from Stand Sit to Stand: Max assist         General transfer comment: Pt needs a lot of assist to get to standing, showed poor awareness or ability to follow instructions  Ambulation/Gait             General Gait Details: no true ambulation, struggled to do any weight shift/transfer in standing, heavily reliant on walker and PT  Stairs            Wheelchair Mobility    Modified Rankin (Stroke Patients Only)       Balance Overall balance assessment: Needs assistance   Sitting balance-Leahy Scale: Fair       Standing  balance-Leahy Scale: Zero                               Pertinent Vitals/Pain Pain Assessment: 0-10 Pain Score: 8  Pain Location: reports increased/severe pain with even minimal R LE movement    Home Living Family/patient expects to be discharged to:: Skilled nursing facility Living Arrangements: Spouse/significant other                    Prior Function Level of Independence: Needs assistance         Comments: Pt with some confusion, reports that she is able to use walker outside the house regularly?     Hand Dominance        Extremity/Trunk Assessment   Upper Extremity Assessment Upper Extremity Assessment: Generalized weakness;RUE deficits/detail RUE Deficits / Details: R UE not tested, very painful with any movement    Lower Extremity Assessment Lower Extremity Assessment: Generalized weakness;RLE deficits/detail RLE Deficits / Details: severe pain with any R LE movement, no AROM at hip or knee       Communication   Communication: No difficulties  Cognition Arousal/Alertness: Awake/alert Behavior During Therapy: Restless;Agitated Overall Cognitive Status: History of cognitive impairments - at baseline  General Comments      Exercises General Exercises - Lower Extremity Ankle Circles/Pumps: AROM;10 reps Quad Sets: AAROM;10 reps Heel Slides: PROM;5 reps Hip ABduction/ADduction: PROM;5 reps   Assessment/Plan    PT Assessment Patient needs continued PT services  PT Problem List Decreased strength;Decreased range of motion;Decreased activity tolerance;Decreased balance;Decreased mobility;Decreased knowledge of use of DME;Decreased safety awareness;Pain       PT Treatment Interventions DME instruction;Gait training;Stair training;Functional mobility training;Therapeutic activities;Therapeutic exercise;Balance training;Cognitive remediation;Patient/family education    PT Goals  (Current goals can be found in the Care Plan section)  Acute Rehab PT Goals Patient Stated Goal: go home PT Goal Formulation: With patient/family Time For Goal Achievement: 03/06/17 Potential to Achieve Goals: Fair    Frequency BID   Barriers to discharge        Co-evaluation               AM-PAC PT "6 Clicks" Daily Activity  Outcome Measure Difficulty turning over in bed (including adjusting bedclothes, sheets and blankets)?: Total Difficulty moving from lying on back to sitting on the side of the bed? : Total Difficulty sitting down on and standing up from a chair with arms (e.g., wheelchair, bedside commode, etc,.)?: Total Help needed moving to and from a bed to chair (including a wheelchair)?: Total Help needed walking in hospital room?: Total Help needed climbing 3-5 steps with a railing? : Total 6 Click Score: 6    End of Session Equipment Utilized During Treatment: Gait belt Activity Tolerance: Patient limited by pain Patient left: with bed alarm set;with call bell/phone within reach Nurse Communication: Mobility status PT Visit Diagnosis: Muscle weakness (generalized) (M62.81);Difficulty in walking, not elsewhere classified (R26.2)    Time: 1610-9604 PT Time Calculation (min) (ACUTE ONLY): 43 min   Charges:   PT Evaluation $PT Eval Low Complexity: 1 Procedure PT Treatments $Therapeutic Exercise: 8-22 mins   PT G Codes:        Malachi Pro, DPT 02/20/2017, 11:00 AM

## 2017-02-20 NOTE — Progress Notes (Signed)
[facility] - [place] at [facility]   PATIENT NAME: Michelle Cabrera    MR#:  [id number]  DATE OF BIRTH:  [birth year]  SUBJECTIVE:  CHIEF COMPLAINT:   Chief Complaint  Patient presents with  . Fall  . Hip Pain    History of recurrent UTI,  Fall, hip fracture- have hypokalemia.corrected, and surgery done to have fixation of her hip and elbow 02/19/17.  have c/o pain, but participated with PT.  REVIEW OF SYSTEMS:  CONSTITUTIONAL: No fever, fatigue or weakness.  EYES: No blurred or double vision.  EARS, NOSE, AND THROAT: No tinnitus or ear pain.  RESPIRATORY: No cough, shortness of breath, wheezing or hemoptysis.  CARDIOVASCULAR: No chest pain, orthopnea, edema.  GASTROINTESTINAL: No nausea, vomiting, diarrhea or abdominal pain.  GENITOURINARY: No dysuria, hematuria.  ENDOCRINE: No polyuria, nocturia,  HEMATOLOGY: No anemia, easy bruising or bleeding SKIN: No rash or lesion. MUSCULOSKELETAL: No joint pain or arthritis.   NEUROLOGIC: No tingling, numbness, weakness.  PSYCHIATRY: No anxiety or depression.   ROS  DRUG ALLERGIES:   Allergies  Allergen Reactions  . Ambien [Zolpidem Tartrate]     Over sedation   . Gold-Containing Drug Products Anaphylaxis  . Adhesive [Tape]     Tears skin  . Dilaudid [Hydromorphone Hcl]     Made tongue swell  . Haldol [Haloperidol] Other (See Comments)    Hallucinations    VITALS:  Blood pressure 139/66, pulse (!) 102, temperature 98.4 F (36.9 C), temperature source Oral, resp. rate 16, height 5\' 2"  (1.575 m), weight 62.2 kg (137 lb 1.6 oz), SpO2 91 %.  PHYSICAL EXAMINATION:  GENERAL:  71 y.o.-year-old patient lying in the bed with no acute distress.  EYES: Pupils equal, round, reactive to light and accommodation. No scleral icterus. Extraocular muscles intact.  HEENT: Head atraumatic, normocephalic. Oropharynx and nasopharynx clear.  NECK:  Supple, no jugular venous distention. No thyroid enlargement, no tenderness.   LUNGS: Normal breath sounds bilaterally, no wheezing, rales,rhonchi or crepitation. No use of accessory muscles of respiration.  CARDIOVASCULAR: S1, S2 normal. No murmurs, rubs, or gallops.  ABDOMEN: Soft, nontender, nondistended. Bowel sounds present. No organomegaly or mass.  EXTREMITIES: No pedal edema, cyanosis, or clubbing.  NEUROLOGIC: Cranial nerves II through XII are intact. Muscle strength 5/5 in left side extremities except right lower have pain due to fracture and s/p surgery.right upper limb is in dressing due to fracture and surgery. Sensation intact. Gait not checked.  PSYCHIATRIC: The patient is alert and oriented x 3.  SKIN: No obvious rash, lesion, or ulcer.   Physical Exam LABORATORY PANEL:   CBC  Recent Labs Lab 02/20/17 0440  WBC 16.7*  HGB 8.2*  HCT 25.0*  PLT 542*   ------------------------------------------------------------------------------------------------------------------  Chemistries   Recent Labs Lab 02/18/17 0140  02/20/17 0440  NA 136  < > 139  K 2.8*  < > 3.6  CL 99*  < > 108  CO2 26  < > 25  GLUCOSE 245*  < > 210*  BUN 27*  < > 11  CREATININE 1.04*  < > 0.52  CALCIUM 8.1*  < > 7.0*  MG  --   --  1.2*  AST 30  --   --   ALT 16  --   --   ALKPHOS 134*  --   --   BILITOT 0.5  --   --   < > = values in this interval not displayed. ------------------------------------------------------------------------------------------------------------------  Cardiac Enzymes No results for input(s):  TROPONINI in the last 168 hours. ------------------------------------------------------------------------------------------------------------------  RADIOLOGY:  Dg Hip Operative Unilat W Or W/o Pelvis Right  Result Date: 02/19/2017 CLINICAL DATA:  Intertrochanteric fracture RIGHT femur, ORIF EXAM: OPERATIVE RIGHT HIP (WITH PELVIS IF PERFORMED) 2 VIEWS TECHNIQUE: Fluoroscopic spot image(s) were submitted for interpretation post-operatively. COMPARISON:   None. FLUOROSCOPY TIME:  1 minutes 32 seconds FINDINGS: IM nail with proximal compression screw placed across a reduced intertrochanteric fracture of the proximal RIGHT femur. Bones appear demineralized. No dislocation identified. Distal extent of the IM nail is not imaged. IMPRESSION: Post ORIF of previously identified intertrochanteric fracture RIGHT femur. Electronically Signed   By: Ulyses Southward M.D.   On: 02/19/2017 16:59    ASSESSMENT AND PLAN:   Active Problems:   Hip fracture (HCC)  * Right hip fracture s/p surgery 02/20/17.   Manage per orthopedic team.   PT and rehab.  pain management,  * Hypokalemia   Replace IV and oral. Rechecked - corrected.  * Olecranon fracture right elbow   Right arm is in sling. Followed by orthopedic team.   S/p internal fixation 02/19/17.  * COPD   Currently no signs of exacerbation, continue home inhalers and nebulizer therapy.  * Hyperlipidemia   Continue pravastatin.  * ac blood loss anemia- due to surgery    Monitor. Will transfuse if < 8.  All the records are reviewed and case discussed with Care Management/Social Workerr. Management plans discussed with the patient, family and they are in agreement.  CODE STATUS: Full.  TOTAL TIME TAKING CARE OF THIS PATIENT: 35 minutes.    POSSIBLE D/C IN 1-2 DAYS, DEPENDING ON CLINICAL CONDITION.   Altamese Dilling M.D on 02/20/2017   Between 7am to 6pm - Pager - [phone]  After 6pm go to [email or web address] - Social research officer, government  [facility]  Office  [phone]  CC: Primary care physician; Lauro Regulus, MD  Note: This dictation was prepared with Dragon dictation along with smaller phrase technology. Any transcriptional errors that result from this process are unintentional.

## 2017-02-20 NOTE — Progress Notes (Signed)
Physical Therapy Treatment Patient Details Name: Michelle Cabrera MRN: [id number] DOB: 11/18/1945 Today's Date: 02/20/2017    History of Present Illness 71 y/o female here after fall at home suffering R hip fx and subsequent ORIF.  Pt also suffered R elbow fx.    PT Comments    Pt needs excessive encouragement to participate even minimally with PT session.  Pt yelling out in pain and needing cues to simply try to breath.  She took excessive time and cuing to participate with very limited hip involvement pain much less actual hip activities.  She did seem to loosen up and actually had almost no screaming during AAROM heel slides/leg extensions.  Pt very anxious and pain limited.    Follow Up Recommendations  SNF     Equipment Recommendations       Recommendations for Other Services       Precautions / Restrictions Precautions Precautions: Fall Restrictions LLE Weight Bearing: Weight bearing as tolerated    Mobility  Bed Mobility Overal bed mobility: Needs Assistance Bed Mobility: Sit to Supine       Sit to supine: Max assist   General bed mobility comments: Pt yelling out in pain t/o transition, unable to assist  Transfers Overall transfer level: Needs assistance   Transfers: Stand Pivot Transfers   Stand pivot transfers: Max assist;Total assist       General transfer comment: Pt again unable to assist, very pain limited and generally weak and unable to stay calm  Ambulation/Gait             General Gait Details: unable to attempt   Stairs            Wheelchair Mobility    Modified Rankin (Stroke Patients Only)       Balance     Sitting balance-Leahy Scale: Fair       Standing balance-Leahy Scale: Zero                              Cognition Arousal/Alertness: Awake/alert Behavior During Therapy: Restless;Agitated Overall Cognitive Status: History of cognitive impairments - at baseline                                         Exercises General Exercises - Lower Extremity Ankle Circles/Pumps: AAROM;AROM;10 reps (pt screaming in pain with even minimal ROM ankle pumps) Quad Sets: AAROM;10 reps Short Arc Quad: AAROM;AROM;10 reps (pt needing rest break between each rep and excessive cuing) Heel Slides: AAROM;10 reps Hip ABduction/ADduction: PROM;AAROM;10 reps    General Comments        Pertinent Vitals/Pain Pain Score: 10-Worst pain ever    Home Living                      Prior Function            PT Goals (current goals can now be found in the care plan section)      Frequency    BID      PT Plan Current plan remains appropriate    Co-evaluation              AM-PAC PT "6 Clicks" Daily Activity  Outcome Measure  Difficulty turning over in bed (including adjusting bedclothes, sheets and blankets)?: Total Difficulty moving from lying on back to sitting on the  side of the bed? : Total Difficulty sitting down on and standing up from a chair with arms (e.g., wheelchair, bedside commode, etc,.)?: Total Help needed moving to and from a bed to chair (including a wheelchair)?: Total Help needed walking in hospital room?: Total Help needed climbing 3-5 steps with a railing? : Total 6 Click Score: 6    End of Session Equipment Utilized During Treatment: Gait belt Activity Tolerance: Patient limited by pain Patient left: with bed alarm set;with call bell/phone within reach   PT Visit Diagnosis: Muscle weakness (generalized) (M62.81);Difficulty in walking, not elsewhere classified (R26.2)     Time: 2094-7096 PT Time Calculation (min) (ACUTE ONLY): 42 min  Charges:  $Therapeutic Exercise: 23-37 mins $Therapeutic Activity: 8-22 mins                    G Codes:       Malachi Pro, DPT 02/20/2017, 3:30 PM

## 2017-02-20 NOTE — Progress Notes (Signed)
[facility] - [place] at [facility]   PATIENT NAME: Michelle Cabrera    MR#:  [id number]  DATE OF BIRTH:  Jan 30, 1946  SUBJECTIVE:  CHIEF COMPLAINT:   Chief Complaint  Patient presents with  . Fall  . Hip Pain    History of recurrent UTI,  Fall, hip fracture- have hypokalemia.corrected, and surgery done to have fixation of her hip and elbow 02/19/17.  REVIEW OF SYSTEMS:  CONSTITUTIONAL: No fever, fatigue or weakness.  EYES: No blurred or double vision.  EARS, NOSE, AND THROAT: No tinnitus or ear pain.  RESPIRATORY: No cough, shortness of breath, wheezing or hemoptysis.  CARDIOVASCULAR: No chest pain, orthopnea, edema.  GASTROINTESTINAL: No nausea, vomiting, diarrhea or abdominal pain.  GENITOURINARY: No dysuria, hematuria.  ENDOCRINE: No polyuria, nocturia,  HEMATOLOGY: No anemia, easy bruising or bleeding SKIN: No rash or lesion. MUSCULOSKELETAL: No joint pain or arthritis.   NEUROLOGIC: No tingling, numbness, weakness.  PSYCHIATRY: No anxiety or depression.   ROS  DRUG ALLERGIES:   Allergies  Allergen Reactions  . Ambien [Zolpidem Tartrate]     Over sedation   . Gold-Containing Drug Products Anaphylaxis  . Adhesive [Tape]     Tears skin  . Dilaudid [Hydromorphone Hcl]     Made tongue swell  . Haldol [Haloperidol] Other (See Comments)    Hallucinations    VITALS:  Blood pressure 139/66, pulse (!) 102, temperature 98.4 F (36.9 C), temperature source Oral, resp. rate 16, height 5\' 2"  (1.575 m), weight 62.2 kg (137 lb 1.6 oz), SpO2 91 %.  PHYSICAL EXAMINATION:  GENERAL:  71 y.o.-year-old patient lying in the bed with no acute distress.  EYES: Pupils equal, round, reactive to light and accommodation. No scleral icterus. Extraocular muscles intact.  HEENT: Head atraumatic, normocephalic. Oropharynx and nasopharynx clear.  NECK:  Supple, no jugular venous distention. No thyroid enlargement, no tenderness.  LUNGS: Normal breath sounds bilaterally, no  wheezing, rales,rhonchi or crepitation. No use of accessory muscles of respiration.  CARDIOVASCULAR: S1, S2 normal. No murmurs, rubs, or gallops.  ABDOMEN: Soft, nontender, nondistended. Bowel sounds present. No organomegaly or mass.  EXTREMITIES: No pedal edema, cyanosis, or clubbing.  NEUROLOGIC: Cranial nerves II through XII are intact. Muscle strength 5/5 in left side extremities except right lower have pain due to fracture and s/p surgery.right upper limb is in dressing due to fracture and surgery. Sensation intact. Gait not checked.  PSYCHIATRIC: The patient is alert and oriented x 3.  SKIN: No obvious rash, lesion, or ulcer.   Physical Exam LABORATORY PANEL:   CBC  Recent Labs Lab 02/20/17 0440  WBC 16.7*  HGB 8.2*  HCT 25.0*  PLT 542*   ------------------------------------------------------------------------------------------------------------------  Chemistries   Recent Labs Lab 02/18/17 0140  02/20/17 0440  NA 136  < > 139  K 2.8*  < > 3.6  CL 99*  < > 108  CO2 26  < > 25  GLUCOSE 245*  < > 210*  BUN 27*  < > 11  CREATININE 1.04*  < > 0.52  CALCIUM 8.1*  < > 7.0*  MG  --   --  1.2*  AST 30  --   --   ALT 16  --   --   ALKPHOS 134*  --   --   BILITOT 0.5  --   --   < > = values in this interval not displayed. ------------------------------------------------------------------------------------------------------------------  Cardiac Enzymes No results for input(s): TROPONINI in the last 168 hours. ------------------------------------------------------------------------------------------------------------------  RADIOLOGY:  Dg Hip Operative Unilat W Or W/o Pelvis Right  Result Date: 02/19/2017 CLINICAL DATA:  Intertrochanteric fracture RIGHT femur, ORIF EXAM: OPERATIVE RIGHT HIP (WITH PELVIS IF PERFORMED) 2 VIEWS TECHNIQUE: Fluoroscopic spot image(s) were submitted for interpretation post-operatively. COMPARISON:  None. FLUOROSCOPY TIME:  1 minutes 32 seconds  FINDINGS: IM nail with proximal compression screw placed across a reduced intertrochanteric fracture of the proximal RIGHT femur. Bones appear demineralized. No dislocation identified. Distal extent of the IM nail is not imaged. IMPRESSION: Post ORIF of previously identified intertrochanteric fracture RIGHT femur. Electronically Signed   By: Ulyses Southward M.D.   On: 02/19/2017 16:59    ASSESSMENT AND PLAN:   Active Problems:   Hip fracture (HCC)  * Right hip fracture s/p surgery 02/20/17.   Manage per orthopedic team.   PT and rehab.  * Hypokalemia   Replace IV and oral. Rechecked - corrected.  * Olecranon fracture right elbow   Right arm is in sling. Followed by orthopedic team.   S/p internal fixation 02/19/17.  * COPD   Currently no signs of exacerbation, continue home inhalers and nebulizer therapy.  * Hyperlipidemia   Continue pravastatin.   All the records are reviewed and case discussed with Care Management/Social Workerr. Management plans discussed with the patient, family and they are in agreement.  CODE STATUS: Full.  TOTAL TIME TAKING CARE OF THIS PATIENT: 35 minutes.    POSSIBLE D/C IN 1-2 DAYS, DEPENDING ON CLINICAL CONDITION.   Altamese Dilling M.D on 02/20/2017   Between 7am to 6pm - Pager - [phone]  After 6pm go to [email or web address] - Social research officer, government  [facility]  Office  [phone]  CC: Primary care physician; Lauro Regulus, MD  Note: This dictation was prepared with Dragon dictation along with smaller phrase technology. Any transcriptional errors that result from this process are unintentional.

## 2017-02-20 NOTE — Anesthesia Postprocedure Evaluation (Signed)
Anesthesia Post Note  Patient: Michelle Cabrera  Procedure(s) Performed: Procedure(s) (LRB): INTRAMEDULLARY (IM) NAIL INTERTROCHANTRIC (Right) OPEN REDUCTION INTERNAL FIXATION (ORIF) ELBOW/OLECRANON FRACTURE (Right)  Patient location during evaluation: PACU Anesthesia Type: General Level of consciousness: awake and alert Pain management: pain level controlled Vital Signs Assessment: post-procedure vital signs reviewed and stable Respiratory status: spontaneous breathing, nonlabored ventilation and respiratory function stable Cardiovascular status: blood pressure returned to baseline and stable Postop Assessment: no signs of nausea or vomiting Anesthetic complications: no     Last Vitals:  Vitals:   02/19/17 2300 02/20/17 0000  BP: (!) 95/51 98/60  Pulse: (!) 111 (!) 103  Resp:    Temp:      Last Pain:  Vitals:   02/19/17 2112  TempSrc: Oral  PainSc:                  Dajana Gehrig

## 2017-02-20 NOTE — Care Management Note (Signed)
Case Management Note  Patient Details  Name: Michelle Cabrera MRN: [id number] Date of Birth: January 01, 1946  Subjective/Objective:   RNCM consult for discharge planning. PT recommending SNF. CSW aware. WIll sign off. Please reconsult if needs arise.                  Action/Plan:   Expected Discharge Date:  02/22/17               Expected Discharge Plan:  Skilled Nursing Facility  In-House Referral:     Discharge planning Services  CM Consult  Post Acute Care Choice:    Choice offered to:     DME Arranged:    DME Agency:     HH Arranged:    HH Agency:     Status of Service:  Completed, signed off  If discussed at [employer], dates discussed:    Additional Comments:  Marily Memos, RN 02/20/2017, 10:58 AM

## 2017-02-20 NOTE — Progress Notes (Addendum)
Clinical Child psychotherapist (CSW) started [employer] authorization. Plan is for patient to D/C to [facility] pending [employer]. CSW will continue to follow and assist as needed.   [employer], LCSW [phone]

## 2017-02-20 NOTE — Progress Notes (Signed)
OT Cancellation Note  Patient Details Name: Michelle Cabrera MRN: [id number] DOB: 31-Jan-1946   Cancelled Treatment:    Reason Eval/Treat Not Completed: Patient declined, no reason specified. Order received, chart reviewed. Spoke with pt and spouse for approx 8 minutes with education in what OT does and in attempt to encourage participation in OT evaluation. Pt politely but adamantly declining due to significant pain and fatigue after difficult PT session. Will re-attempt OT evaluation next date.   Richrd Prime, MPH, MS, OTR/L ascom [phone] 02/20/17, 4:10 PM

## 2017-02-20 NOTE — Progress Notes (Signed)
Pt. Had 5 beat run of v-tach. Dr. Anne Hahn notified and no new orders at this time

## 2017-02-21 LAB — BASIC METABOLIC PANEL
ANION GAP: 7 (ref 5–15)
BUN: 15 mg/dL (ref 6–20)
CALCIUM: 7.7 mg/dL — AB (ref 8.9–10.3)
CO2: 27 mmol/L (ref 22–32)
Chloride: 104 mmol/L (ref 101–111)
Creatinine, Ser: 0.57 mg/dL (ref 0.44–1.00)
GFR calc Af Amer: >60 mL/min (ref >60)
Glucose, Bld: 133 mg/dL — ABNORMAL HIGH (ref 65–99)
POTASSIUM: 4 mmol/L (ref 3.5–5.1)
SODIUM: 138 mmol/L (ref 135–145)

## 2017-02-21 LAB — GLUCOSE, CAPILLARY
GLUCOSE-CAPILLARY: 162 mg/dL — AB (ref 65–99)
Glucose-Capillary: 120 mg/dL — ABNORMAL HIGH (ref 65–99)
Glucose-Capillary: 152 mg/dL — ABNORMAL HIGH (ref 65–99)
Glucose-Capillary: 120 mg/dL — ABNORMAL HIGH (ref 65–99)

## 2017-02-21 LAB — CBC
HCT: 23.7 % — ABNORMAL LOW (ref 35.0–47.0)
HEMATOCRIT: 29.1 % — AB (ref 35.0–47.0)
Hemoglobin: 7.9 g/dL — ABNORMAL LOW (ref 12.0–16.0)
Hemoglobin: 9.5 g/dL — ABNORMAL LOW (ref 12.0–16.0)
MCH: 29 pg (ref 26.0–34.0)
MCH: 28 pg (ref 26.0–34.0)
MCHC: 33.4 g/dL (ref 32.0–36.0)
MCHC: 32.6 g/dL (ref 32.0–36.0)
MCV: 86.9 fL (ref 80.0–100.0)
MCV: 85.9 fL (ref 80.0–100.0)
PLATELETS: 600 10*3/uL — AB (ref 150–440)
PLATELETS: 586 10*3/uL — AB (ref 150–440)
RBC: 2.72 MIL/uL — AB (ref 3.80–5.20)
RBC: 3.39 MIL/uL — ABNORMAL LOW (ref 3.80–5.20)
RDW: 17.7 % — ABNORMAL HIGH (ref 11.5–14.5)
RDW: 16.6 % — AB (ref 11.5–14.5)
WBC: 14.7 10*3/uL — AB (ref 3.6–11.0)
WBC: 14.9 10*3/uL — ABNORMAL HIGH (ref 3.6–11.0)

## 2017-02-21 LAB — MAGNESIUM: MAGNESIUM: 2.1 mg/dL (ref 1.7–2.4)

## 2017-02-21 LAB — HEMOGLOBIN A1C
Hgb A1c MFr Bld: 6.5 % — ABNORMAL HIGH (ref 4.8–5.6)
Mean Plasma Glucose: 140 mg/dL

## 2017-02-21 LAB — ABO/RH: ABO/RH(D): O POS

## 2017-02-21 LAB — PREPARE RBC (CROSSMATCH)

## 2017-02-21 MED ORDER — ENOXAPARIN SODIUM 40 MG/0.4ML ~~LOC~~ SOLN: 40.0000 mg | SUBCUTANEOUS | 0 refills | Status: DC

## 2017-02-21 MED ORDER — HYDROCODONE-ACETAMINOPHEN 5-325 MG PO TABS: 1.0000 | ORAL_TABLET | Freq: Four times a day (QID) | ORAL | 0 refills | Status: DC | PRN

## 2017-02-21 MED ORDER — OXYMORPHONE HCL 5 MG PO TABS: 5.0000 mg | ORAL_TABLET | ORAL | 0 refills | Status: DC | PRN

## 2017-02-21 MED ORDER — BISACODYL 10 MG RE SUPP: 10.0000 mg | Freq: Every day | RECTAL | 0 refills | Status: DC | PRN

## 2017-02-21 MED ORDER — SODIUM CHLORIDE 0.9 % IV SOLN
Freq: Once | INTRAVENOUS | Status: AC
  Administered 2017-02-21: 12:00:00 via INTRAVENOUS

## 2017-02-21 MED ORDER — FERROUS SULFATE 325 (65 FE) MG PO TABS: 325.0000 mg | ORAL_TABLET | Freq: Two times a day (BID) | ORAL | 3 refills | Status: DC

## 2017-02-21 NOTE — Progress Notes (Signed)
Pts BP 94/40, MD Vachhani notified. No new orders received, (pt to receive 1 unit of blood today).

## 2017-02-21 NOTE — Progress Notes (Signed)
PT Cancellation Note  Patient Details Name: Michelle Cabrera MRN: [id number] DOB: Feb 28, 1946   Cancelled Treatment:    Reason Eval/Treat Not Completed: Other (comment) Treatment attempted 2x this am. Initially, pt unavailable, as she was having labs drawn. Currently, unable to treat, as pt is with OT. Will re-attempt, time permitting.    Latanya Maudlin 02/21/2017, 10:40 AM

## 2017-02-21 NOTE — Progress Notes (Signed)
Physical Therapy Treatment Patient Details Name: Michelle Cabrera MRN: [id number] DOB: Mar 15, 1946 Today's Date: 02/21/2017    History of Present Illness Pt. is a 71 y.o. female who was admitted to [facility] for ORIF repair of a right Intertrochanteric Hip Fx, and right elbow/olecranon Fracture. Pt. PMHX includes: RA, HTN,. GERD, Depression, DDD, COPD, Vertebral compression fractures, and  multiple lumbar surgeries.    PT Comments    Pt is pleasant and willing to participate, but highly anxious and moaning in pain with min movement to R LE. Pt bed mobility, tranfers, and ambulation not performed, as pt was receiving blood transfusion during session. Session limited to supine therex. Pt primarily limited by pain, unable to perform high repetitions of supine exercises. Overall, pt responded well to today's treatment with no adverse affects, progressing towards PT goals. Pt would benefit from skilled PT to address the previously mentioned impairments and promote return to PLOF. Currently recommending SNF, pending d/c.    Follow Up Recommendations  SNF     Equipment Recommendations  None recommended by PT    Recommendations for Other Services       Precautions / Restrictions Precautions Precautions: Fall Restrictions Weight Bearing Restrictions: Yes LLE Weight Bearing: Weight bearing as tolerated    Mobility  Bed Mobility               General bed mobility comments: pt receiving blood transfusion; not performed  Transfers                 General transfer comment: pt receiving blood transfusion; not performed  Ambulation/Gait             General Gait Details: pt receiving blood transfusion; not performed   Stairs            Wheelchair Mobility    Modified Rankin (Stroke Patients Only)       Balance                                            Cognition Arousal/Alertness: Awake/alert Behavior During Therapy: Flat affect Overall  Cognitive Status: History of cognitive impairments - at baseline                                        Exercises Other Exercises Other Exercises: Supine therex performed to B LE's with Mod-MaxA to R LE adn with supervision to R LE x 10 reps: ankle pumps (supervision B), quad sets (supervision B), glute sets (Supervision B), hip abd (x5 to R), and SAQ (x4 to R and x15 to L). Pt Moaning and requesting  to stop therex. following hip abd. Pt calm prior to leaving session    General Comments        Pertinent Vitals/Pain Pain Assessment: Faces Faces Pain Scale: Hurts whole lot Pain Location: reports increased/severe pain with even minimal R LE movement Pain Descriptors / Indicators: Constant;Grimacing;Guarding;Moaning;Operative site guarding Pain Intervention(s): Limited activity within patient's tolerance;Monitored during session;Relaxation;Repositioned    Home Living                      Prior Function            PT Goals (current goals can now be found in the care plan section) Acute Rehab PT Goals  Patient Stated Goal: To regain independence PT Goal Formulation: With patient/family Time For Goal Achievement: 03/06/17 Potential to Achieve Goals: Fair Progress towards PT goals: Progressing toward goals    Frequency    BID      PT Plan Current plan remains appropriate    Co-evaluation              AM-PAC PT "6 Clicks" Daily Activity  Outcome Measure  Difficulty turning over in bed (including adjusting bedclothes, sheets and blankets)?: Total Difficulty moving from lying on back to sitting on the side of the bed? : Total Difficulty sitting down on and standing up from a chair with arms (e.g., wheelchair, bedside commode, etc,.)?: Total Help needed moving to and from a bed to chair (including a wheelchair)?: Total Help needed walking in hospital room?: Total Help needed climbing 3-5 steps with a railing? : Total 6 Click Score: 6    End  of Session Equipment Utilized During Treatment: Gait belt Activity Tolerance: Patient limited by pain Patient left: with bed alarm set;with call bell/phone within reach;in bed Nurse Communication: Mobility status PT Visit Diagnosis: Muscle weakness (generalized) (M62.81);Difficulty in walking, not elsewhere classified (R26.2)     Time: 4967-5916 PT Time Calculation (min) (ACUTE ONLY): 17 min  Charges:                       G Codes:       Sharman Cheek PT, SPT   Latanya Maudlin 02/21/2017, 5:22 PM

## 2017-02-21 NOTE — Progress Notes (Signed)
   Subjective: 2 Days Post-Op Procedure(s) (LRB): INTRAMEDULLARY (IM) NAIL INTERTROCHANTRIC (Right) OPEN REDUCTION INTERNAL FIXATION (ORIF) ELBOW/OLECRANON FRACTURE (Right) Patient reports pain as mild.   Once again patient is sleeping upon entering the room. Was able to communicate. Follow commands during the exam. Patient is well, and has had no acute complaints or problems Did very little yesterday with physical therapy. Would not get out of bed. Did some range of motion of the right lower extremity. Patient complaining of severe pain in right with a physical therapist initially. Plan is to go Rehab after hospital stay. no nausea and no vomiting Patient denies any chest pains or shortness of breath. Objective: Vital signs in last 24 hours: Temp:  [97.4 F (36.3 C)-98.4 F (36.9 C)] 97.4 F (36.3 C) (07/27 0031) Pulse Rate:  [97-104] 97 (07/27 0031) Resp:  [16-18] 18 (07/27 0031) BP: (112-145)/(55-77) 112/55 (07/27 0031) SpO2:  [91 %-96 %] 94 % (07/27 0031) well approximated incision Heels are non tender and elevated off the bed using rolled towels Intake/Output from previous day: 07/26 0701 - 07/27 0700 In: 240 [P.O.:240] Out: 1350 [Urine:1350] Intake/Output this shift: No intake/output data recorded.   Recent Labs  02/19/17 0446 02/20/17 0440 02/21/17 0452  HGB 10.1* 8.2* 7.9*    Recent Labs  02/20/17 0440 02/21/17 0452  WBC 16.7* 14.7*  RBC 2.86* 2.72*  HCT 25.0* 23.7*  PLT 542* 600*    Recent Labs  02/20/17 0440 02/21/17 0452  NA 139 138  K 3.6 4.0  CL 108 104  CO2 25 27  BUN 11 15  CREATININE 0.52 0.57  GLUCOSE 210* 133*  CALCIUM 7.0* 7.7*    Recent Labs  02/19/17 0910  INR 1.14    EXAM General - Patient is Alert and Appropriate Extremity - Neurologically intact Neurovascular intact Sensation intact distally Intact pulses distally Dorsiflexion/Plantar flexion intact No cellulitis present Compartment soft Dressing - scant  drainage Motor Function - intact, moving foot and toes well on exam.    Past Medical History:  Diagnosis Date  . Anxiety   . Chronic lower back pain   . Closed compression fracture of L1 lumbar vertebra (HCC) 11/2012  . Collagen vascular disease (HCC)   . COPD (chronic obstructive pulmonary disease) (HCC)   . DDD (degenerative disc disease)   . Depression   . Fall 09/27/2016   Fall Feb 04, 2016 with broken vertebra  . GERD (gastroesophageal reflux disease)   . Hypertension   . Rheumatoid arthritis (HCC)     Assessment/Plan: 2 Days Post-Op Procedure(s) (LRB): INTRAMEDULLARY (IM) NAIL INTERTROCHANTRIC (Right) OPEN REDUCTION INTERNAL FIXATION (ORIF) ELBOW/OLECRANON FRACTURE (Right) Active Problems:   Hip fracture (HCC)  Estimated body mass index is 25.08 kg/m as calculated from the following:   Height as of this encounter: 5\' 2"  (1.575 m).   Weight as of this encounter: 62.2 kg (137 lb 1.6 oz). Up with therapy Discharge to SNF when medically stable.  Labs: Were reviewed. Hemoglobin 7.9. Otherwise labs except. DVT Prophylaxis - Lovenox, Foot Pumps and TED hose Weight-Bearing as tolerated to right leg Patient needs to have a bowel movement. Please change dressing. Continue Lovenox 40 mg daily for 2 weeks at the time of discharge. Patient will need follow-up in [facility] orthopedics in 1 week. Elevate heels off bed. TED stockings are to be worn bilaterally. These may be removed one hour per 8 hour shift.  [facility]. [facility] 02/21/2017, 7:37 AM

## 2017-02-21 NOTE — Clinical Social Work Placement (Signed)
   CLINICAL SOCIAL WORK PLACEMENT  NOTE  Date:  02/21/2017  Patient Details  Name: SAPRINA CHUONG MRN: [id number] Date of Birth: 09/10/1945  Clinical Social Work is seeking post-discharge placement for this patient at the Skilled  Nursing Facility level of care (*CSW will initial, date and re-position this form in  chart as items are completed):  Yes   Patient/family provided with [facility] Clinical Social Work Department's list of facilities offering this level of care within the geographic area requested by the patient (or if unable, by the patient's family).  Yes   Patient/family informed of their freedom to choose among providers that offer the needed level of care, that participate in Medicare, Medicaid or managed care program needed by the patient, have an available bed and are willing to accept the patient.  Yes   Patient/family informed of [facility]'s ownership interest in [facility] and [facility], as well as of the fact that they are under no obligation to receive care at these facilities.  PASRR submitted to EDS on 02/18/17     PASRR number received on 02/19/17     Existing PASRR number confirmed on       FL2 transmitted to all facilities in geographic area requested by pt/family on 02/18/17     FL2 transmitted to all facilities within larger geographic area on       Patient informed that his/her managed care company has contracts with or will negotiate with certain facilities, including the following:        Yes   Patient/family informed of bed offers received.  Patient chooses bed at  [facility] )     Physician recommends and patient chooses bed at      Patient to be transferred to   on  .  Patient to be transferred to facility by       Patient family notified on   of transfer.  Name of family member notified:        PHYSICIAN       Additional Comment:    _______________________________________________ Teria Khachatryan, Darleen Crocker,  LCSW 02/21/2017, 4:00 PM

## 2017-02-21 NOTE — Evaluation (Signed)
Occupational Therapy Evaluation Patient Details Name: Michelle Cabrera MRN: [id number] DOB: 1945-11-22 Today's Date: 02/21/2017    History of Present Illness Pt. is a 71 y.o. female who was admitted to [facility] for ORIF repair of a right Intertrochanteric Hip Fx, and right elbow/olecranon Fracture. Pt. PMHX includes: RA, HTN,. GERD, Depression, DDD, COPD, Vertebral compression fractures, and  multiple lumbar surgeries.   Clinical Impression   Pt. Was admitted to [facility] for ORIF repair of a right hip Fracture, and right Olecranon fracture sustained in a fall while walking to the kitchen. Pt. Resides at home with her husband who assist pt. with daily ADL tasks, morning care, and IADL tasks. Pt. Presents with Right elbow immobilization, difficulty using her nondominant Left hand, weakness, 10/10 pain at rest, and impaired functional mobility during ADL tasks. Pt. Will benefit from OT services to improve ADL and IADL functioning, and work towards increasing independence, and decreasing caregiver burden. Pt. Would benefit from SNF upon discharge with follow-up OT services.     Follow Up Recommendations  SNF    Equipment Recommendations       Recommendations for Other Services       Precautions / Restrictions Precautions Precautions: Fall Restrictions Weight Bearing Restrictions: Yes LLE Weight Bearing: Weight bearing as tolerated                                                    ADL either performed or assessed with clinical judgement   ADL Overall ADL's : Needs assistance/impaired Eating/Feeding: Set up;Minimal assistance   Grooming: Moderate assistance   Upper Body Bathing: Maximal assistance   Lower Body Bathing: Maximal assistance   Upper Body Dressing : Maximal assistance   Lower Body Dressing: Maximal assistance               Functional mobility during ADLs: Total assistance General ADL Comments: Pt. education was provided verbally, and through  visual demonstration about A/E use for LE ADLs. Pt. reports having a reacher at home.     Vision         Perception     Praxis      Pertinent Vitals/Pain Pain Assessment: 0-10 Pain Score: 10-Worst pain ever Pain Intervention(s): Premedicated before session;Limited activity within patient's tolerance;Monitored during session     Hand Dominance Right   Extremity/Trunk Assessment Upper Extremity Assessment RUE Deficits / Details: Right UE not tested.           Communication Communication Communication: No difficulties   Cognition Arousal/Alertness: Lethargic Behavior During Therapy: Flat affect Overall Cognitive Status: History of cognitive impairments - at baseline                                     General Comments       Exercises     Shoulder Instructions      Home Living Family/patient expects to be discharged to:: Skilled nursing facility Living Arrangements: Spouse/significant other                                      Prior Functioning/Environment Level of Independence: Needs assistance    ADL's / Homemaking Assistance Needed: Pt. husband assist pt. with  ADLs, morning care, medication management using a pillbox, and meal preparation.            OT Problem List: Decreased strength;Decreased range of motion;Decreased activity tolerance;Impaired balance (sitting and/or standing);Pain;Decreased safety awareness;Decreased cognition;Decreased knowledge of use of DME or AE      OT Treatment/Interventions: Self-care/ADL training;Therapeutic exercise;DME and/or AE instruction;Therapeutic activities;Patient/family education    OT Goals(Current goals can be found in the care plan section) Acute Rehab OT Goals Patient Stated Goal: To regain independence OT Goal Formulation: With patient Potential to Achieve Goals: Good  OT Frequency: Min 2X/week   Barriers to D/C:            Co-evaluation              AM-PAC PT  "6 Clicks" Daily Activity     Outcome Measure Help from another person eating meals?: A Lot Help from another person taking care of personal grooming?: A Lot Help from another person toileting, which includes using toliet, bedpan, or urinal?: Total Help from another person bathing (including washing, rinsing, drying)?: Total Help from another person to put on and taking off regular upper body clothing?: Total Help from another person to put on and taking off regular lower body clothing?: Total 6 Click Score: 8   End of Session    Activity Tolerance: Patient limited by fatigue Patient left: in bed;with call bell/phone within reach;with bed alarm set  OT Visit Diagnosis: History of falling (Z91.81)                Time: 0354-6568 OT Time Calculation (min): 21 min Charges:  OT General Charges $OT Visit: 1 Procedure OT Evaluation $OT Eval Moderate Complexity: 1 Procedure G-Codes:     [name], MS, OTR/L   [name], MS, OTR/L 02/21/2017, 12:21 PM

## 2017-02-21 NOTE — Discharge Summary (Addendum)
[facility] [place] at [facility]   PATIENT NAME: Michelle Cabrera    MR#:  [id number]  DATE OF BIRTH:  Dec 04, 1945  DATE OF ADMISSION:  02/18/2017 ADMITTING PHYSICIAN: Ihor Austin, MD  DATE OF DISCHARGE: 02/22/2017  PRIMARY CARE PHYSICIAN: Lauro Regulus, MD    ADMISSION DIAGNOSIS:  Closed fracture of right olecranon process, initial encounter [S52.021A] Fall, initial encounter [W19.XXXA] Closed fracture of right hip, initial encounter (HCC) [S72.001A]  DISCHARGE DIAGNOSIS:  Active Problems:   Hip fracture (HCC)   SECONDARY DIAGNOSIS:   Past Medical History:  Diagnosis Date  . Anxiety   . Chronic lower back pain   . Closed compression fracture of L1 lumbar vertebra (HCC) 11/2012  . Collagen vascular disease (HCC)   . COPD (chronic obstructive pulmonary disease) (HCC)   . DDD (degenerative disc disease)   . Depression   . Fall 09/27/2016   Fall Feb 04, 2016 with broken vertebra  . GERD (gastroesophageal reflux disease)   . Hypertension   . Rheumatoid arthritis [facility])     HOSPITAL COURSE:   71 year old female with past medical history of hypertension, rheumatoid arthritis, depression, COPD, anxiety, GERD who presented to the hospital after a fall and noted to have a right hip fracture.  * Right hip fracture -  seen by orthopedics and status post ORIF postop day #2 today. -Patient's pain is well-controlled with oral pain medications, and she is working with physical therapy well. -Discharge to skilled nursing facility for ongoing rehabilitation. Continue Lovenox for DVT prophylaxis. Follow up with orthopedics in the next week to 2 weeks.  * Hypokalemia-this has been replaced and resolved now.  * Olecranon fracture right elbow-this was secondary to the fall.   Right arm is in sling. Patient will continue orthopedic follow-up as an outpatient.   S/p internal fixation 02/19/17.  * COPD-no acute exacerbation. -Patient will continue  albuterol inhaler, Breo-Ellipta as outpatient.   * Hyperlipidemia   Continue pravastatin.  * ac blood loss anemia- due to surgery  - pt. Was transfused 1 unit and Hg. Has improved and is currently stable upon discharge.   * GERD - pt. Will cont. Omeprazole.   * History of depression-patient will continue her Cymbalta.  DISCHARGE CONDITIONS:   Stable.  CONSULTS OBTAINED:  Treatment Team:  Donato Heinz, MD  DRUG ALLERGIES:   Allergies  Allergen Reactions  . Ambien [Zolpidem Tartrate]     Over sedation   . Gold-Containing Drug Products Anaphylaxis  . Adhesive [Tape]     Tears skin  . Dilaudid [Hydromorphone Hcl]     Made tongue swell  . Haldol [Haloperidol] Other (See Comments)    Hallucinations    DISCHARGE MEDICATIONS:   Current Discharge Medication List    START taking these medications   Details  bisacodyl (DULCOLAX) 10 MG suppository Place 1 suppository (10 mg total) rectally daily as needed for moderate constipation. Qty: 12 suppository, Refills: 0    enoxaparin (LOVENOX) 40 MG/0.4ML injection Inject 0.4 mLs (40 mg total) into the skin daily. Qty: 14 Syringe, Refills: 0    ferrous sulfate 325 (65 FE) MG tablet Take 1 tablet (325 mg total) by mouth 2 (two) times daily with a meal. Qty: 60 tablet, Refills: 3      CONTINUE these medications which have CHANGED   Details  HYDROcodone-acetaminophen (NORCO) 5-325 MG tablet Take 1 tablet by mouth every 6 (six) hours as needed for moderate pain. Qty: 20 tablet, Refills: 0  oxymorphone (OPANA) 5 MG tablet Take 1 tablet (5 mg total) by mouth every 4 (four) hours as needed for pain. Qty: 20 tablet, Refills: 0      CONTINUE these medications which have NOT CHANGED   Details  acetaminophen (TYLENOL) 500 MG tablet Take 500 mg by mouth every 6 (six) hours as needed.    albuterol (PROVENTIL HFA;VENTOLIN HFA) 108 (90 Base) MCG/ACT inhaler Inhale 2 puffs into the lungs every 6 (six) hours as needed for wheezing  or shortness of breath.    !! DULoxetine (CYMBALTA) 60 MG capsule Take 60 mg by mouth every morning. *Note dose*    Etanercept 25 MG/0.5ML SOSY Inject 1 mL into the skin once a week. *Give on Friday*    ibuprofen (ADVIL,MOTRIN) 200 MG tablet Take 400 mg by mouth every 6 (six) hours as needed for mild pain. Reported on 12/07/2015    omeprazole (PRILOSEC) 20 MG capsule Take 20 mg by mouth 2 (two) times daily.     polyethylene glycol (MIRALAX / GLYCOLAX) packet Take 17 g by mouth daily. Qty: 14 each, Refills: 0    pravastatin (PRAVACHOL) 10 MG tablet Take 10 mg by mouth at bedtime.    QUEtiapine (SEROQUEL) 100 MG tablet Take 1.5 tablets (150 mg total) by mouth at bedtime. Qty: 60 tablet, Refills: 0    Calcium Carbonate-Vit D-Min (CALCIUM-VITAMIN D-MINERALS) 600-400 MG-UNIT CHEW Chew 1 tablet by mouth 2 (two) times daily.     clopidogrel (PLAVIX) 75 MG tablet TAKE 1 TABLET BY MOUTH ONCE DAILY. Qty: 30 tablet, Refills: 0    !! DULoxetine (CYMBALTA) 30 MG capsule Take 30 mg by mouth at bedtime. *Note dose*    FLECTOR 1.3 % PTCH Apply 1 patch topically 2 (two) times daily. Refills: 0    Fluticasone Furoate-Vilanterol (BREO ELLIPTA) 100-25 MCG/INH AEPB Inhale 1 puff into the lungs every morning.    folic acid (FOLVITE) 1 MG tablet Take 1 mg by mouth daily.    gabapentin (NEURONTIN) 300 MG capsule Take 600 mg by mouth at bedtime. May take an additional 300mg s 4 times daily as needed for pain    Trolamine Salicylate (ASPERCREME EX) Apply 1 application topically 2 (two) times daily as needed (pain).    vitamin C (ASCORBIC ACID) 500 MG tablet Take 500 mg by mouth daily.     !! - Potential duplicate medications found. Please discuss with provider.    STOP taking these medications     Methotrexate, PF, 25 MG/0.4ML SOAJ          DISCHARGE INSTRUCTIONS:    Follow with ortho clinic in 2 weeks.  If you experience worsening of your admission symptoms, develop shortness of breath,  life threatening emergency, suicidal or homicidal thoughts you must seek medical attention immediately by calling 911 or calling your MD immediately  if symptoms less severe.  You Must read complete instructions/literature along with all the possible adverse reactions/side effects for all the Medicines you take and that have been prescribed to you. Take any new Medicines after you have completely understood and accept all the possible adverse reactions/side effects.   Please note  You were cared for by a hospitalist during your hospital stay. If you have any questions about your discharge medications or the care you received while you were in the hospital after you are discharged, you can call the unit and asked to speak with the hospitalist on call if the hospitalist that took care of you is not available. Once you are  discharged, your primary care physician will handle any further medical issues. Please note that NO REFILLS for any discharge medications will be authorized once you are discharged, as it is imperative that you return to your primary care physician (or establish a relationship with a primary care physician if you do not have one) for your aftercare needs so that they can reassess your need for medications and monitor your lab values.    Today   No acute events overnight. Still having some right arm and right hip pain. Work with physical therapy well. Seen by orthopedics and stable for discharge to rehabilitation today.  VITAL SIGNS:  Blood pressure (!) 134/46, pulse (!) 104, temperature 98.1 F (36.7 C), temperature source Oral, resp. rate 18, height 5\' 2"  (1.575 m), weight 62.2 kg (137 lb 1.6 oz), SpO2 97 %.  I/O:   Intake/Output Summary (Last 24 hours) at 02/21/17 1439 Last data filed at 02/21/17 1230  Gross per 24 hour  Intake              600 ml  Output             1100 ml  Net             -500 ml    PHYSICAL EXAMINATION:   GENERAL:  71 y.o.-year-old patient lying in  the bed with no acute distress.  EYES: Pupils equal, round, reactive to light and accommodation. No scleral icterus. Extraocular muscles intact.  HEENT: Head atraumatic, normocephalic. Oropharynx and nasopharynx clear.  NECK:  Supple, no jugular venous distention. No thyroid enlargement, no tenderness.  LUNGS: Normal breath sounds bilaterally, no wheezing, rales,rhonchi or crepitation. No use of accessory muscles of respiration.  CARDIOVASCULAR: S1, S2 normal. No murmurs, rubs, or gallops.  ABDOMEN: Soft, nontender, nondistended. Bowel sounds present. No organomegaly or mass.  EXTREMITIES: No pedal edema, cyanosis, or clubbing. Right arm in a sling, right hip dressing with no acute drainage or bleeding. NEUROLOGIC: Cranial nerves II through XII are intact. No focal motor or sensory deficits appreciated bilaterally. PSYCHIATRIC: The patient is alert and oriented x 3.  SKIN: No obvious rash, lesion, or ulcer.   DATA REVIEW:   CBC  Recent Labs Lab 02/21/17 0452  WBC 14.7*  HGB 7.9*  HCT 23.7*  PLT 600*    Chemistries   Recent Labs Lab 02/18/17 0140  02/21/17 0452  NA 136  < > 138  K 2.8*  < > 4.0  CL 99*  < > 104  CO2 26  < > 27  GLUCOSE 245*  < > 133*  BUN 27*  < > 15  CREATININE 1.04*  < > 0.57  CALCIUM 8.1*  < > 7.7*  MG  --   < > 2.1  AST 30  --   --   ALT 16  --   --   ALKPHOS 134*  --   --   BILITOT 0.5  --   --   < > = values in this interval not displayed.  Cardiac Enzymes No results for input(s): TROPONINI in the last 168 hours.  Microbiology Results  Results for orders placed or performed during the hospital encounter of 02/18/17  Surgical PCR screen     Status: None   Collection Time: 02/18/17  5:43 AM  Result Value Ref Range Status   MRSA, PCR NEGATIVE NEGATIVE Final   Staphylococcus aureus NEGATIVE NEGATIVE Final    Comment:        The Xpert SA  Assay (FDA approved for NASAL specimens in patients over 49 years of age), is one component of a  comprehensive surveillance program.  Test performance has been validated by [facility] for patients greater than or equal to 44 year old. It is not intended to diagnose infection nor to guide or monitor treatment.   Urine Culture     Status: None   Collection Time: 02/18/17 12:08 PM  Result Value Ref Range Status   Specimen Description URINE, RANDOM  Final   Special Requests NONE  Final   Culture   Final    NO GROWTH Performed at [facility], [address], [place], [place] [address]    Report Status 02/19/2017 FINAL  Final    RADIOLOGY:  Dg Hip Operative Unilat W Or W/o Pelvis Right  Result Date: 02/19/2017 CLINICAL DATA:  Intertrochanteric fracture RIGHT femur, ORIF EXAM: OPERATIVE RIGHT HIP (WITH PELVIS IF PERFORMED) 2 VIEWS TECHNIQUE: Fluoroscopic spot image(s) were submitted for interpretation post-operatively. COMPARISON:  None. FLUOROSCOPY TIME:  1 minutes 32 seconds FINDINGS: IM nail with proximal compression screw placed across a reduced intertrochanteric fracture of the proximal RIGHT femur. Bones appear demineralized. No dislocation identified. Distal extent of the IM nail is not imaged. IMPRESSION: Post ORIF of previously identified intertrochanteric fracture RIGHT femur. Electronically Signed   By: Ulyses Southward M.D.   On: 02/19/2017 16:59    Management plans discussed with the patient, family and they are in agreement.  CODE STATUS:     Code Status Orders        Start     Ordered   02/18/17 0531  Full code  Continuous     02/18/17 0530     TOTAL TIME TAKING CARE OF THIS PATIENT: 40 minutes.    Altamese Dilling M.D on 02/21/2017 at 2:39 PM  Between 7am to 6pm - Pager - [phone]  After 6pm go to [email or web address] - Social research officer, government  [facility]  Office  [phone]  CC: Primary care physician; Lauro Regulus, MD   Note: This dictation was prepared with Dragon dictation along with smaller phrase technology. Any  transcriptional errors that result from this process are unintentional.

## 2017-02-21 NOTE — Progress Notes (Signed)
[facility] Medicare authorization for SNF has been received, auth # [id number]. Plan is for patient to D/C to [facility] tomorrow room 207-A pending medical clearance. RN will call report at [phone]. Clinical Child psychotherapist (CSW) sent D/C summary to [facility] today. Patient is aware of above. CSW contacted patient's husband Michelle Cabrera and made him aware of above. [facility] admissions coordinator at [facility] is aware of above. CSW will continue to follow and assist as needed.   [employer], LCSW [phone]

## 2017-02-21 NOTE — Care Management Important Message (Signed)
Important Message  Patient Details  Name: Michelle Cabrera MRN: [id number] Date of Birth: [birth year]   Medicare Important Message Given:  Yes    Marily Memos, RN 02/21/2017, 8:21 AM

## 2017-02-21 NOTE — Progress Notes (Signed)
[facility] [place] [facility]   PATIENT NAME: Michelle Cabrera    MR#:  [id number]  DATE OF BIRTH:  [birth year]  SUBJECTIVE:  CHIEF COMPLAINT:   Chief Complaint  Patient presents with  . Fall  . Hip Pain    History of recurrent UTI,  Fall, hip fracture- have hypokalemia.corrected, and surgery done to have fixation of her hip and elbow 02/19/17.  have c/o pain, but participated with PT.   Appears very weak, still complaining of pain and hemoglobin dropped to less than 8.  REVIEW OF SYSTEMS:  CONSTITUTIONAL: No fever,Positive for fatigue or weakness.  EYES: No blurred or double vision.  EARS, NOSE, AND THROAT: No tinnitus or ear pain.  RESPIRATORY: No cough, shortness of breath, wheezing or hemoptysis.  CARDIOVASCULAR: No chest pain, orthopnea, edema.  GASTROINTESTINAL: No nausea, vomiting, diarrhea or abdominal pain.  GENITOURINARY: No dysuria, hematuria.  ENDOCRINE: No polyuria, nocturia,  HEMATOLOGY: No anemia, easy bruising or bleeding SKIN: No rash or lesion. MUSCULOSKELETAL: No joint pain or arthritis.   NEUROLOGIC: No tingling, numbness, weakness.  PSYCHIATRY: No anxiety or depression.   ROS  DRUG ALLERGIES:   Allergies  Allergen Reactions  . Ambien [Zolpidem Tartrate]     Over sedation   . Gold-Containing Drug Products Anaphylaxis  . Adhesive [Tape]     Tears skin  . Dilaudid [Hydromorphone Hcl]     Made tongue swell  . Haldol [Haloperidol] Other (See Comments)    Hallucinations    VITALS:  Blood pressure 123/62, pulse (!) 103, temperature 97.6 F (36.4 C), temperature source Oral, resp. rate 18, height 5\' 2"  (1.575 m), weight 62.2 kg (137 lb 1.6 oz), SpO2 96 %.  PHYSICAL EXAMINATION:  GENERAL:  71 y.o.-year-old patient lying in the bed with no acute distress.  EYES: Pupils equal, round, reactive to light and accommodation. No scleral icterus. Extraocular muscles intact. Conjunctiva pale. HEENT: Head atraumatic, normocephalic.  Oropharynx and nasopharynx clear.  NECK:  Supple, no jugular venous distention. No thyroid enlargement, no tenderness.  LUNGS: Normal breath sounds bilaterally, no wheezing, rales,rhonchi or crepitation. No use of accessory muscles of respiration.  CARDIOVASCULAR: S1, S2 normal. No murmurs, rubs, or gallops.  ABDOMEN: Soft, nontender, nondistended. Bowel sounds present. No organomegaly or mass.  EXTREMITIES: No pedal edema, cyanosis, or clubbing.  NEUROLOGIC: Cranial nerves II through XII are intact. Muscle strength 4/5 in left side extremities except right lower have pain due to fracture and s/p surgery.right upper limb is in dressing due to fracture and surgery. Sensation intact. Gait not checked.  PSYCHIATRIC: The patient is alert and oriented x 3.  SKIN: No obvious rash, lesion, or ulcer.   Physical Exam LABORATORY PANEL:   CBC  Recent Labs Lab 02/21/17 1556  WBC 14.9*  HGB 9.5*  HCT 29.1*  PLT 586*   ------------------------------------------------------------------------------------------------------------------  Chemistries   Recent Labs Lab 02/18/17 0140  02/21/17 0452  NA 136  < > 138  K 2.8*  < > 4.0  CL 99*  < > 104  CO2 26  < > 27  GLUCOSE 245*  < > 133*  BUN 27*  < > 15  CREATININE 1.04*  < > 0.57  CALCIUM 8.1*  < > 7.7*  MG  --   < > 2.1  AST 30  --   --   ALT 16  --   --   ALKPHOS 134*  --   --   BILITOT 0.5  --   --   < > =  values in this interval not displayed. ------------------------------------------------------------------------------------------------------------------  Cardiac Enzymes No results for input(s): TROPONINI in the last 168 hours. ------------------------------------------------------------------------------------------------------------------  RADIOLOGY:  No results found.  ASSESSMENT AND PLAN:   Active Problems:   Hip fracture (HCC)  * Right hip fracture s/p surgery 02/20/17.   Manage per orthopedic team.   PT and  rehab.  pain management,  * Hypokalemia   Replace IV and oral. Rechecked - corrected.  * Olecranon fracture right elbow   Right arm is in sling. Followed by orthopedic team.   S/p internal fixation 02/19/17.  * COPD   Currently no signs of exacerbation, continue home inhalers and nebulizer therapy.  * Hyperlipidemia   Continue pravastatin.  * ac blood loss anemia- due to surgery   Hemoglobin is less than 8, 1 unit of PRBC transfused.  * Generalized weakness and pain   Due to surgery, PT suggested rehabilitation placement, likely discharged tomorrow.  All the records are reviewed and case discussed with Care Management/Social Workerr. Management plans discussed with the patient, family and they are in agreement.  CODE STATUS: Full.  TOTAL TIME TAKING CARE OF THIS PATIENT: 35 minutes.    POSSIBLE D/C IN 1-2 DAYS, DEPENDING ON CLINICAL CONDITION.   Altamese Dilling M.D on 02/21/2017   Between 7am to 6pm - Pager - [phone]  After 6pm go to [email or web address] - Social research officer, government  [facility]  Office  [phone]  CC: Primary care physician; Lauro Regulus, MD  Note: This dictation was prepared with Dragon dictation along with smaller phrase technology. Any transcriptional errors that result from this process are unintentional.

## 2017-02-22 LAB — GLUCOSE, CAPILLARY
GLUCOSE-CAPILLARY: 103 mg/dL — AB (ref 65–99)
Glucose-Capillary: 151 mg/dL — ABNORMAL HIGH (ref 65–99)

## 2017-02-22 LAB — TYPE AND SCREEN
ABO/RH(D): O POS
Antibody Screen: NEGATIVE
UNIT DIVISION: 0

## 2017-02-22 LAB — BPAM RBC
BLOOD PRODUCT EXPIRATION DATE: 201808222359
ISSUE DATE / TIME: 201807271207
UNIT TYPE AND RH: 5100

## 2017-02-22 NOTE — Consult Note (Addendum)
ORTHOPAEDICS PROGRESS NOTE  PATIENT NAME: Michelle Cabrera DOB: 05/01/1946  MRN: [id number]  POD # 3: ORIF right intertrochanteric femur fracture; ORIF right olecranon fracture  Subjective: The patient was resting comfortably this morning. She states that the pain is under control at this time. No complaints of nausea or vomiting.  Objective: Vital signs in last 24 hours: Temp:  [97.5 F (36.4 C)-98.4 F (36.9 C)] 98.4 F (36.9 C) (07/28 0748) Pulse Rate:  [102-110] 104 (07/28 0748) Resp:  [18] 18 (07/28 0748) BP: (94-135)/(40-74) 116/74 (07/28 0748) SpO2:  [92 %-97 %] 93 % (07/28 0748)  Intake/Output from previous day: 07/27 0701 - 07/28 0700 In: 900 [P.O.:600; Blood:300] Out: 350 [Urine:350]   Recent Labs  02/19/17 0910 02/20/17 0440 02/21/17 0452 02/21/17 1556  WBC  --  16.7* 14.7* 14.9*  HGB  --  8.2* 7.9* 9.5*  HCT  --  25.0* 23.7* 29.1*  PLT  --  542* 600* 586*  K  --  3.6 4.0  --   CL  --  108 104  --   CO2  --  25 27  --   BUN  --  11 15  --   CREATININE  --  0.52 0.57  --   GLUCOSE  --  210* 133*  --   CALCIUM  --  7.0* 7.7*  --   INR 1.14  --   --   --     EXAM General: Well-developed well-nourished female seen in no apparent discomfort. Right upper extremity: Posterior splint is intact. No significant swelling to the hand or digits. Right lower extremity: Moderate amount of old serosanguineous drainage on the dressings. New dressings were applied. Mild peri-incisional ecchymosis. No erythema. Mild swelling to the thigh. Neurologic: Awake, alert, and oriented. Sensory motor function are grossly intact.  Assessment: ORIF right intertrochanteric femur fracture ORIF right olecranon fracture  Secondary diagnoses: Acute blood loss anemia secondary to fractures and surgery Rheumatoid arthritis Hypertension Gastroesophageal reflux disease Depression Degenerative disc disease COPD History of vertebral compression fracture status post  kyphoplasty's Status post lumbar surgery with chronic pain Anxiety  Plan: From physical therapy were reviewed. The patient is making slow progress with therapy but is participating. Plan is to go Skilled nursing facility after hospital stay. DVT Prophylaxis - Lovenox, Foot Pumps and TED hose  The patient will need follow-up in the office in approximately 2 weeks.  Megumi Treaster P. Angie Fava M.D.

## 2017-02-22 NOTE — Progress Notes (Signed)
Attempted to call report to [facility]. Waiting on callback.

## 2017-02-22 NOTE — Progress Notes (Signed)
Report given to Reggie @ [facility].

## 2017-02-22 NOTE — Progress Notes (Signed)
EMS called for transportation.  

## 2017-02-22 NOTE — Progress Notes (Signed)
Attempted to call report

## 2017-02-22 NOTE — Progress Notes (Signed)
EMS here to transport pt. 

## 2017-02-22 NOTE — Progress Notes (Signed)
Physical Therapy Treatment Patient Details Name: Michelle Cabrera MRN: [id number] DOB: 06/26/1946 Today's Date: 02/22/2017    History of Present Illness Pt. is a 71 y.o. female who was admitted to [facility] for ORIF repair of a right Intertrochanteric Hip Fx, and right elbow/olecranon Fracture. Pt. PMHX includes: RA, HTN,. GERD, Depression, DDD, COPD, Vertebral compression fractures, and  multiple lumbar surgeries.    PT Comments    Participated in exercises as described below.  To edge of bed with mod a x 2.  Pt with some effort today during transition.  Sitting EOB with min guard and cues to limit RUE assistance.  Sling donned for transfer to chair at bedside.  Max a x 2 stand pivot to chair.  CNA assisted with transfer and aware of need for +2 for mobility skills.  Overall increased participation and tolerance for activity today but remains appropriate for SNF due to limited mobility.     Follow Up Recommendations  SNF     Equipment Recommendations       Recommendations for Other Services       Precautions / Restrictions Precautions Precautions: Fall Restrictions Weight Bearing Restrictions: Yes LLE Weight Bearing: Weight bearing as tolerated    Mobility  Bed Mobility Overal bed mobility: Needs Assistance Bed Mobility: Sit to Supine     Supine to sit: Max assist;+2 for physical assistance;Mod assist        Transfers Overall transfer level: Needs assistance Equipment used: None Transfers: Stand Pivot Transfers Sit to Stand: Max assist Stand pivot transfers: +2 physical assistance;Max assist       General transfer comment: stand pivot without assistive device.  Ambulation/Gait             General Gait Details: unable at this time   Stairs            Wheelchair Mobility    Modified Rankin (Stroke Patients Only)       Balance Overall balance assessment: Needs assistance Sitting-balance support: Feet supported;Single extremity  supported Sitting balance-Leahy Scale: Fair       Standing balance-Leahy Scale: Zero                              Cognition Arousal/Alertness: Awake/alert Behavior During Therapy: WFL for tasks assessed/performed Overall Cognitive Status: History of cognitive impairments - at baseline                                        Exercises General Exercises - Lower Extremity Ankle Circles/Pumps: AAROM;AROM;20 reps Quad Sets: 10 reps;AROM;Both Gluteal Sets: AROM;Both;10 reps Heel Slides: AAROM;Both;20 reps Hip ABduction/ADduction: AAROM;Both;10 reps    General Comments        Pertinent Vitals/Pain Pain Assessment: Faces Faces Pain Scale: Hurts whole lot Pain Location: R Le/hip Pain Descriptors / Indicators: Constant;Grimacing;Guarding;Moaning;Operative site guarding Pain Intervention(s): Limited activity within patient's tolerance;Monitored during session    Home Living                      Prior Function            PT Goals (current goals can now be found in the care plan section) Progress towards PT goals: Progressing toward goals    Frequency    BID      PT Plan Current plan remains appropriate    Co-evaluation  AM-PAC PT "6 Clicks" Daily Activity  Outcome Measure  Difficulty turning over in bed (including adjusting bedclothes, sheets and blankets)?: Total Difficulty moving from lying on back to sitting on the side of the bed? : Total Difficulty sitting down on and standing up from a chair with arms (e.g., wheelchair, bedside commode, etc,.)?: Total Help needed moving to and from a bed to chair (including a wheelchair)?: Total Help needed walking in hospital room?: Total Help needed climbing 3-5 steps with a railing? : Total 6 Click Score: 6    End of Session Equipment Utilized During Treatment: Gait belt Activity Tolerance: Patient limited by pain Patient left: in chair;with chair alarm set;with  call bell/phone within reach Nurse Communication: Mobility status       Time: 1700-1749 PT Time Calculation (min) (ACUTE ONLY): 38 min  Charges:  $Gait Training: 8-22 mins $Therapeutic Exercise: 8-22 mins $Therapeutic Activity: 8-22 mins                    G Codes:       Danielle Dess, PTA 02/22/17, 9:45 AM

## 2017-02-22 NOTE — Clinical Social Work Note (Signed)
Patient to discharge to [facility] room 207-A via non-emergent EMS. The patient, her spouse, and the facility are aware. The paperwork has been sent to the facility, and the patient's discharge packet has been delivered to the patient's chart. The CSW will continue to follow for any additional discharge needs.  Argentina Ponder, MSW, The[facility] Majors [phone]

## 2017-02-24 ENCOUNTER — Other Ambulatory Visit: Payer: Self-pay

## 2017-02-24 MED ORDER — OXYMORPHONE HCL 5 MG PO TABS: 5.0000 mg | ORAL_TABLET | ORAL | 0 refills | Status: DC | PRN

## 2017-02-24 NOTE — Telephone Encounter (Signed)
Rx sent to [facility] phone : [phone] , fax : [phone]  

## 2017-02-25 ENCOUNTER — Other Ambulatory Visit
Admission: RE | Admit: 2017-02-25 | Discharge: 2017-02-25 | Disposition: A | Discharge status: Home / Self Care | Payer: Medicare Other | Source: Ambulatory Visit | Attending: Gerontology | Admitting: Gerontology

## 2017-02-25 DIAGNOSIS — J449 Chronic obstructive pulmonary disease, unspecified: Secondary | ICD-10-CM | POA: Insufficient documentation

## 2017-02-25 LAB — CBC WITH DIFFERENTIAL/PLATELET
Basophils Absolute: 0 10*3/uL (ref 0–0.1)
Basophils Relative: 1 %
EOS ABS: 0.6 10*3/uL (ref 0–0.7)
EOS PCT: 6 %
HCT: 29.1 % — ABNORMAL LOW (ref 35.0–47.0)
Hemoglobin: 9.6 g/dL — ABNORMAL LOW (ref 12.0–16.0)
LYMPHS ABS: 1.9 10*3/uL (ref 1.0–3.6)
Lymphocytes Relative: 20 %
MCH: 28.9 pg (ref 26.0–34.0)
MCHC: 33.1 g/dL (ref 32.0–36.0)
MCV: 87.2 fL (ref 80.0–100.0)
Monocytes Absolute: 0.8 10*3/uL (ref 0.2–0.9)
Monocytes Relative: 9 %
Neutro Abs: 6.1 10*3/uL (ref 1.4–6.5)
Neutrophils Relative %: 64 %
PLATELETS: 603 10*3/uL — AB (ref 150–440)
RBC: 3.33 MIL/uL — AB (ref 3.80–5.20)
RDW: 17.4 % — AB (ref 11.5–14.5)
WBC: 9.5 10*3/uL (ref 3.6–11.0)

## 2017-02-25 LAB — COMPREHENSIVE METABOLIC PANEL
ALT: 10 U/L — AB (ref 14–54)
ANION GAP: 8 (ref 5–15)
AST: 25 U/L (ref 15–41)
Albumin: 2.1 g/dL — ABNORMAL LOW (ref 3.5–5.0)
Alkaline Phosphatase: 128 U/L — ABNORMAL HIGH (ref 38–126)
BUN: 12 mg/dL (ref 6–20)
CO2: 26 mmol/L (ref 22–32)
Calcium: 8 mg/dL — ABNORMAL LOW (ref 8.9–10.3)
Chloride: 107 mmol/L (ref 101–111)
Creatinine, Ser: 0.51 mg/dL (ref 0.44–1.00)
GFR calc non Af Amer: >60 mL/min (ref >60)
Glucose, Bld: 103 mg/dL — ABNORMAL HIGH (ref 65–99)
POTASSIUM: 3.4 mmol/L — AB (ref 3.5–5.1)
SODIUM: 141 mmol/L (ref 135–145)
Total Bilirubin: 0.8 mg/dL (ref 0.3–1.2)
Total Protein: 5.7 g/dL — ABNORMAL LOW (ref 6.5–8.1)

## 2017-02-26 ENCOUNTER — Encounter
Admission: RE | Admit: 2017-02-26 | Discharge: 2017-02-26 | Disposition: A | Discharge status: Home / Self Care | Payer: Medicare Other | Source: Ambulatory Visit | Attending: Internal Medicine | Admitting: Internal Medicine

## 2017-02-26 ENCOUNTER — Encounter: Payer: Self-pay | Admitting: Gerontology

## 2017-02-26 ENCOUNTER — Non-Acute Institutional Stay (SKILLED_NURSING_FACILITY): Payer: Medicare Other | Admitting: Gerontology

## 2017-02-26 DIAGNOSIS — S72001D Fracture of unspecified part of neck of right femur, subsequent encounter for closed fracture with routine healing: Secondary | ICD-10-CM

## 2017-02-26 DIAGNOSIS — R000 Tachycardia, unspecified: Secondary | ICD-10-CM

## 2017-02-26 DIAGNOSIS — E46 Unspecified protein-calorie malnutrition: Secondary | ICD-10-CM

## 2017-02-26 NOTE — Progress Notes (Signed)
Location:   [facility] Room Number: 207A Place of Service:  SNF [phone]) Provider:  Toni Arthurs, NP-C  Kirk Ruths, MD  Patient Care Team: Kirk Ruths, MD as PCP - General (Internal Medicine) Erby Pian, MD as Consulting Physician (Specialist)  Extended Emergency Contact Information Primary Emergency Contact: Valeria Batman Address: [address]          [place], [place] [address] [place] Phone: [phone] Mobile Phone: [phone] Relation: Spouse Secondary Emergency Contact: Gouge,Christopher Address: [address]          [place], [place] [address] [place] Phone: [phone] Relation: Son  Code Status:  FULL Goals of care: Advanced Directive information Advanced Directives 02/26/2017  Does Patient Have a Medical Advance Directive? No  Type of Advance Directive -  [place] in Chart? -  Would patient like information on creating a medical advance directive? -  Pre-existing out of facility DNR order (yellow form or pink MOST form) -     Chief Complaint  Patient presents with  . Acute Visit    Follow up on wound drainage    HPI:  Pt is a 71 y.o. female seen today for an acute visit for increased wound drainage. Nursing concerned they changed the dressing to the right hip this morning. Wound has drained to the point of soaking through the dressing and the chuck pad under the hip. Drainage is clear, serosanginous drainage. No odor. Incision is well approximated, staples intact. No obvious area of dehiscence. No peri-incisional redness or warmth. Right thigh is edematous, but non-tender. Calves soft, supple. Pt is also tachycardic, but asymptomatic. Upon reviewing labs this am, she was found to have protein-calorie malnutrition with hypoalbuminemia, low protein, anemia and mild hypokalemia. Pt endorses not eating well lately. Will start pt on  nutritional supplementation for wound healing. Otherwise, pt reports she is feeling fine and that her pain is controlled. VSS. No other complaints.      Past Medical History:  Diagnosis Date  . Anxiety   . Arthritis   . Cataract   . Cervicalgia   . Chronic lower back pain   . Closed compression fracture of L1 lumbar vertebra ([place]) 11/2012  . Collagen vascular disease ([place])   . COPD (chronic obstructive pulmonary disease) ([place])    unspecified  . DDD (degenerative disc disease)   . Depression   . Diabetes mellitus ([place])   . Disorder of bursae and tendons in shoulder region    unspecified  . Fall 09/27/2016   Fall Feb 04, 2016 with broken vertebra  . GERD (gastroesophageal reflux disease)   . Headache   . Hereditary and idiopathic peripheral neuropathy    unspecified  . History of adenomatous polyp of colon    Followed by Dr. Tiffany Kocher  . History of tubal ligation   . Hypertension   . Intermediate coronary syndrome ([place])   . Lumbago   . Neuralgia and neuritis, unspecified    radiculitis unspecified  . Rheumatoid arthritis ([place])   . Rheumatoid arthritis ([place])   . Senile osteoporosis   . Spinal stenosis, lumbar region without neurogenic claudication    Past Surgical History:  Procedure Laterality Date  . BACK SURGERY    . BREAST BIOPSY Left    negative  . CATARACT EXTRACTION W/ INTRAOCULAR LENS  IMPLANT, BILATERAL Bilateral   . COLONOSCOPY  08/17/2007   Adenomatous Polyps  .  COLONOSCOPY  10/09/2010   PH Adenomatous polyps : [facility] 09/2015 ; Recall Ltr mailed 08/17/2015 (dw)  . ESOPHAGOGASTRODUODENOSCOPY  01/14/2008   No repeat per RTE  . FINGER ARTHROPLASTY Right 09/15/2014   Procedure: RIGHT MIDDLE FINGER, RING FINGER, SMALL FINGER EXTENSOR DIGITORUM COMMUMIS STABILIZATION WITH RIGHT MIDDLE FINGER MCP ARTHROPLASTY, POSSIBLE RING FINGER AND SMALL FINGER MCP ARTHROPLASTY;  Surgeon: Roseanne Kaufman, MD;  Location: [facility];  Service: Orthopedics;  Laterality: Right;  . FOOT SURGERY  Bilateral   . HAND TENDON SURGERY Right 09/15/2014   "d/t RA"  . HUMERUS FRACTURE SURGERY Left   . INTRAMEDULLARY (IM) NAIL INTERTROCHANTERIC Right 02/19/2017   Procedure: INTRAMEDULLARY (IM) NAIL INTERTROCHANTRIC;  Surgeon: Dereck Leep, MD;  Location: [facility];  Service: Orthopedics;  Laterality: Right;  . KYPHOPLASTY N/A 01/04/2013   Procedure: L1 Kyphoplasty;  Surgeon: Hosie Spangle, MD;  Location: [facility];  Service: Neurosurgery;  Laterality: N/A;  L1 Kyphoplasty  . KYPHOPLASTY N/A 02/07/2014   Procedure: THORACIC SIX KYPHOPLASTY;  Surgeon: Hosie Spangle, MD;  Location: [facility];  Service: Neurosurgery;  Laterality: N/A;  T6 Kyphoplasty   . KYPHOPLASTY N/A 02/08/2016   Procedure: KYPHOPLASTY  T-12;  Surgeon: Hessie Knows, MD;  Location: [facility];  Service: Orthopedics;  Laterality: N/A;  . KYPHOPLASTY N/A 10/01/2016   Procedure: KYPHOPLASTY  L-5;  Surgeon: Hessie Knows, MD;  Location: [facility];  Service: Orthopedics;  Laterality: N/A;  . LUMBAR [place] SURGERY  2014    X 3  . LUMBAR LAMINECTOMY    . ORIF ELBOW FRACTURE Right 02/19/2017   Procedure: OPEN REDUCTION INTERNAL FIXATION (ORIF) ELBOW/OLECRANON FRACTURE;  Surgeon: Dereck Leep, MD;  Location: [facility];  Service: Orthopedics;  Laterality: Right;  . PERIPHERAL VASCULAR CATHETERIZATION Left 12/07/2015   Procedure: Upper Extremity Angiography;  Surgeon: Algernon Huxley, MD;  Location: [facility];  Service: Cardiovascular;  Laterality: Left;  . PERIPHERAL VASCULAR CATHETERIZATION  12/07/2015   Procedure: Upper Extremity Intervention;  Surgeon: Algernon Huxley, MD;  Location: [facility];  Service: Cardiovascular;;  . POSTERIOR LUMBAR FUSION     "got screws in"  . TONSILLECTOMY    . TUBAL LIGATION    . WRIST FUSION Right    30 yrs ago    Allergies  Allergen Reactions  . Ambien [Zolpidem Tartrate]     Over sedation   . Gold-Containing Drug Products Anaphylaxis  . Adhesive [Tape]     Tears skin  .  Dilaudid [Hydromorphone Hcl]     Made tongue swell  . Haldol [Haloperidol] Other (See Comments)    Hallucinations    Allergies as of 02/26/2017      Reactions   Ambien [zolpidem Tartrate]    Over sedation    Gold-containing Drug Products Anaphylaxis   Adhesive [tape]    Tears skin   Dilaudid [hydromorphone Hcl]    Made tongue swell   Haldol [haloperidol] Other (See Comments)   Hallucinations      Medication List       Accurate as of 02/26/17 11:32 AM. Always use your most recent med list.          acetaminophen 500 MG tablet Commonly known as:  TYLENOL Take 500 mg by mouth 4 (four) times daily.   albuterol 108 (90 Base) MCG/ACT inhaler Commonly known as:  PROVENTIL HFA;VENTOLIN HFA Inhale 2 puffs into the lungs every 6 (six) hours as needed for wheezing or shortness of breath.   bisacodyl 10  MG suppository Commonly known as:  DULCOLAX Place 1 suppository (10 mg total) rectally daily as needed for moderate constipation.   BREO ELLIPTA 100-25 MCG/INH Aepb Generic drug:  fluticasone furoate-vilanterol Inhale 1 puff into the lungs every morning.   Calcium Carbonate-Vitamin D 600-400 MG-UNIT tablet Take 1 tablet by mouth 2 (two) times daily.   Cholecalciferol 4000 units Caps Take 1 capsule by mouth daily.   DULoxetine 60 MG capsule Commonly known as:  CYMBALTA Take 60 mg by mouth every morning. *Note dose*   DULoxetine 30 MG capsule Commonly known as:  CYMBALTA Take 30 mg by mouth at bedtime. *Note dose*   enoxaparin 40 MG/0.4ML injection Commonly known as:  LOVENOX Inject 0.4 mLs (40 mg total) into the skin daily.   ENSURE ENLIVE PO Take 1 Bottle by mouth 2 (two) times daily between meals.   Etanercept 25 MG/0.5ML Sosy Inject 1 mL into the skin once a week. *Give on Friday*   feeding supplement (PRO-STAT SUGAR FREE 64) Liqd Take 30 mLs by mouth 2 (two) times daily between meals.   ferrous sulfate 325 (65 FE) MG tablet Take 1 tablet (325 mg total) by  mouth 2 (two) times daily with a meal.   FLECTOR 1.3 % Ptch Generic drug:  diclofenac Apply 1 patch topically 2 (two) times daily.   folic acid 1 MG tablet Commonly known as:  FOLVITE Take 1 mg by mouth daily.   gabapentin 300 MG capsule Commonly known as:  NEURONTIN Take 300 mg by mouth every 4 hours prn; May take an additional (300 mg) by mouth 4 times daily as needed for pain   ibuprofen 200 MG tablet Commonly known as:  ADVIL,MOTRIN Take 400 mg by mouth 3 (three) times daily. Reported on 12/07/2015   METHOTREXATE SODIUM (PF) IJ Inject 0.8 mLs into the skin once a week. Methotrexate sodium solution (25 mg/ ml ) Give 0.8 ml on Monday   metoprolol tartrate 25 MG tablet Commonly known as:  LOPRESSOR Take 25 mg by mouth 2 (two) times daily.   omeprazole 20 MG capsule Commonly known as:  PRILOSEC Take 20 mg by mouth 2 (two) times daily.   polyethylene glycol packet Commonly known as:  MIRALAX / GLYCOLAX Take 17 g by mouth daily.   pravastatin 10 MG tablet Commonly known as:  PRAVACHOL Take 10 mg by mouth at bedtime.   QUEtiapine 100 MG tablet Commonly known as:  SEROQUEL Take 1.5 tablets (150 mg total) by mouth at bedtime.   senna 8.6 MG tablet Commonly known as:  SENOKOT Take 1 tablet by mouth 2 (two) times daily.   trolamine salicylate 10 % cream Commonly known as:  ASPERCREME Apply 1 application topically 2 (two) times daily as needed for muscle pain.   vitamin C 500 MG tablet Commonly known as:  ASCORBIC ACID Take 500 mg by mouth daily.       Review of Systems  Constitutional: Positive for appetite change. Negative for activity change, chills, diaphoresis and fever.  Respiratory: Negative for apnea, cough, choking, chest tightness, shortness of breath and wheezing.   Cardiovascular: Negative for chest pain, palpitations and leg swelling.  Gastrointestinal: Negative for abdominal distention, abdominal pain, constipation, diarrhea and nausea.    Genitourinary: Negative for difficulty urinating, dysuria, frequency and urgency.  Musculoskeletal: Positive for arthralgias (typical arthritis), back pain, gait problem and myalgias.  Skin: Positive for wound. Negative for color change, pallor and rash.  Neurological: Negative for dizziness, tremors, syncope, speech difficulty, weakness, numbness  and headaches.  Psychiatric/Behavioral: Negative for agitation and behavioral problems.  All other systems reviewed and are negative.   Immunization History  Administered Date(s) Administered  . Influenza Split 05/02/2014  . Influenza-Unspecified 04/07/2012, 04/22/2013, 03/29/2014  . PPD Test 02/09/2016  . Pneumococcal Polysaccharide-23 10/23/2009  . Tdap 12/19/2010   Pertinent  Health Maintenance Due  Topic Date Due  . FOOT EXAM  10/16/1955  . OPHTHALMOLOGY EXAM  [birth year]16/1955  . URINE MICROALBUMIN  10/16/1955  . COLONOSCOPY  10/16/1995  . DEXA SCAN  10/16/2010  . PNA vac Low Risk Adult (1 of 2 - PCV13) 10/16/2010  . INFLUENZA VACCINE  02/26/2017  . HEMOGLOBIN A1C  08/23/2017  . MAMMOGRAM  06/27/2018   No flowsheet data found. Functional Status Survey:    Vitals:   02/26/17 1035  BP: (!) 151/80  Pulse: (!) 102  Resp: 20  Temp: 98.3 F (36.8 C)  SpO2: 98%  Weight: 138 lb 11.2 oz (62.9 kg)  Height: _0  (1.575 m)   Body mass index is 25.37 kg/m. Physical Exam  Constitutional: She is oriented to person, place, and time. Vital signs are normal. She appears well-developed and well-nourished. She is active and cooperative. She does not appear ill. No distress.  HENT:  Head: Normocephalic and atraumatic.  Mouth/Throat: Uvula is midline, oropharynx is clear and moist and mucous membranes are normal. Mucous membranes are not pale, not dry and not cyanotic.  Eyes: Pupils are equal, round, and reactive to light. Conjunctivae, EOM and lids are normal.  Neck: Trachea normal, normal range of motion and full passive range of motion  without pain. Neck supple. No JVD present. No tracheal deviation, no edema and no erythema present. No thyromegaly present.  Cardiovascular: Regular rhythm, normal heart sounds, intact distal pulses and normal pulses.  Tachycardia present.  Exam reveals no gallop, no distant heart sounds and no friction rub.   No murmur heard. Pulses:      Dorsalis pedis pulses are 2+ on the right side, and 2+ on the left side.  Pulmonary/Chest: Effort normal and breath sounds normal. No accessory muscle usage. No respiratory distress. She has no wheezes. She has no rales. She exhibits no tenderness.  Abdominal: Soft. Normal appearance and bowel sounds are normal. She exhibits no distension and no ascites. There is no tenderness.  Musculoskeletal: She exhibits no edema.       Right elbow: She exhibits decreased range of motion and swelling. Tenderness found.       Right hip: She exhibits decreased range of motion, decreased strength, tenderness, swelling and laceration.  Expected osteoarthritis, stiffness, B- calves soft, supple. Negative Homan's sign  Neurological: She is alert and oriented to person, place, and time. She has normal strength.  Skin: Skin is warm and dry. Laceration (Right hip) noted. She is not diaphoretic. No cyanosis. No pallor. Nails show no clubbing.  Right hip incision with drainage. No signs of infection. Right elbow acewrap splint in place  Psychiatric: She has a normal mood and affect. Her speech is normal and behavior is normal. Judgment and thought content normal. Cognition and memory are normal.  Nursing note and vitals reviewed.   Labs reviewed:  Recent Labs  02/20/17 0440 02/21/17 0452 02/25/17 0605  NA 139 138 141  K 3.6 4.0 3.4*  CL 108 104 107  CO2 _1 GLUCOSE 210* 133* 103*  BUN _2 CREATININE 0.52 0.57 0.51  CALCIUM 7.0* 7.7* 8.0*  MG 1.2* 2.1  --  Recent Labs  11/30/16 1325 02/18/17 0140 02/25/17 0605  AST _0 ALT 15 16 10*  ALKPHOS  131* 134* 128*  BILITOT 0.7 0.5 0.8  PROT 8.6* 7.2 5.7*  ALBUMIN 3.9 2.9* 2.1*    Recent Labs  02/18/17 0140  02/21/17 0452 02/21/17 1556 02/25/17 0605  WBC 18.6*  < > 14.7* 14.9* 9.5  NEUTROABS 14.7*  --   --   --  6.1  HGB 11.5*  < > 7.9* 9.5* 9.6*  HCT 33.4*  < > 23.7* 29.1* 29.1*  MCV 86.0  < > 86.9 85.9 87.2  PLT 662*  < > 600* 586* 603*  < > = values in this interval not displayed. No results found for: TSH Lab Results  Component Value Date   HGBA1C 6.5 (H) 02/20/2017   No results found for: CHOL, HDL, LDLCALC, LDLDIRECT, TRIG, CHOLHDL  Significant Diagnostic Results in last 30 days:  Dg Chest 1 View  Result Date: 02/18/2017 CLINICAL DATA:  Fall going to the bathroom tonight. EXAM: CHEST 1 VIEW COMPARISON:  Radiograph 10/28/2016 FINDINGS: The cardiomediastinal contours are normal. Atherosclerosis of the thoracic aorta. Vascular stent projects over the left mediastinum. Pulmonary vasculature is normal. Mild chronic bronchial thickening and hyperinflation. No consolidation, pleural effusion, or pneumothorax. Multiple thoracic vertebral compression fractures with vertebral augmentation. Remote right proximal humerus fracture. Bones are diffusely under mineralized. IMPRESSION: 1. No acute abnormality. Mild chronic bronchial thickening and hyperinflation suggesting COPD. 2. Thoracic aortic atherosclerosis. 3. Multiple compression fractures in the thoracic spine, some of which with vertebral augmentation. Remote right proximal humerus fracture. Electronically Signed   By: Jeb Levering M.D.   On: 02/18/2017 02:49   Dg Elbow Complete Right  Result Date: 02/18/2017 CLINICAL DATA:  Right elbow pain after fall going to the bathroom. EXAM: RIGHT ELBOW - COMPLETE 3+ VIEW COMPARISON:  None. FINDINGS: Displaced mid olecranon fracture. There is 11 mm distraction of posterior cortex. Articular offset of approximately 3-4 mm. There is a large joint effusion with lipohemarthrosis. Soft tissue  edema posteriorly about the elbow. No additional acute fracture. IMPRESSION: Displaced mid olecranon fracture with large joint effusion. Electronically Signed   By: Jeb Levering M.D.   On: 02/18/2017 02:46   Dg Knee 2 Views Right  Result Date: 02/18/2017 CLINICAL DATA:  All going to the bathroom, right knee pain. EXAM: RIGHT KNEE - 1-2 VIEW COMPARISON:  None. FINDINGS: Fibula is partially obscured on AP view due to overlapping osseous structures. No evidence of fracture, dislocation, or joint effusion. Diffuse bony under mineralization. Growth arrest line in the proximal tibia. Minimal tibiofemoral joint space narrowing. Alignment is maintained. Soft tissues are unremarkable. IMPRESSION: No fracture of the right knee allowing for limited visualization of the fibula. Electronically Signed   By: Jeb Levering M.D.   On: 02/18/2017 02:45   Ct Head Wo Contrast  Result Date: 02/18/2017 CLINICAL DATA:  Fall EXAM: CT HEAD WITHOUT CONTRAST CT CERVICAL SPINE WITHOUT CONTRAST TECHNIQUE: Multidetector CT imaging of the head and cervical spine was performed following the standard protocol without intravenous contrast. Multiplanar CT image reconstructions of the cervical spine were also generated. COMPARISON:  Head CT 11/10/2015 FINDINGS: CT HEAD FINDINGS Brain: No mass lesion, intraparenchymal hemorrhage or extra-axial collection. No evidence of acute cortical infarct. Brain parenchyma and CSF-containing spaces are normal for age. Vascular: No hyperdense vessel or unexpected calcification. Skull: Normal visualized skull base, calvarium and extracranial soft tissues. Sinuses/Orbits: No sinus fluid levels or advanced mucosal thickening. No mastoid  effusion. Normal orbits. CT CERVICAL SPINE FINDINGS Alignment: No static subluxation. Facets are aligned. Occipital condyles are normally positioned. Skull base and vertebrae: No acute fracture. Soft tissues and spinal canal: No prevertebral fluid or swelling. No visible  canal hematoma. Disc levels: No advanced spinal canal or neural foraminal stenosis. Upper chest: No pneumothorax, pulmonary nodule or pleural effusion. Other: Normal visualized paraspinal cervical soft tissues. IMPRESSION: 1. Normal head CT for age.  No acute intracranial abnormality. 2. No acute fracture or static subluxation of the cervical spine. Electronically Signed   By: Ulyses Jarred M.D.   On: 02/18/2017 03:41   Ct Cervical Spine Wo Contrast  Result Date: 02/18/2017 CLINICAL DATA:  Fall EXAM: CT HEAD WITHOUT CONTRAST CT CERVICAL SPINE WITHOUT CONTRAST TECHNIQUE: Multidetector CT imaging of the head and cervical spine was performed following the standard protocol without intravenous contrast. Multiplanar CT image reconstructions of the cervical spine were also generated. COMPARISON:  Head CT 11/10/2015 FINDINGS: CT HEAD FINDINGS Brain: No mass lesion, intraparenchymal hemorrhage or extra-axial collection. No evidence of acute cortical infarct. Brain parenchyma and CSF-containing spaces are normal for age. Vascular: No hyperdense vessel or unexpected calcification. Skull: Normal visualized skull base, calvarium and extracranial soft tissues. Sinuses/Orbits: No sinus fluid levels or advanced mucosal thickening. No mastoid effusion. Normal orbits. CT CERVICAL SPINE FINDINGS Alignment: No static subluxation. Facets are aligned. Occipital condyles are normally positioned. Skull base and vertebrae: No acute fracture. Soft tissues and spinal canal: No prevertebral fluid or swelling. No visible canal hematoma. Disc levels: No advanced spinal canal or neural foraminal stenosis. Upper chest: No pneumothorax, pulmonary nodule or pleural effusion. Other: Normal visualized paraspinal cervical soft tissues. IMPRESSION: 1. Normal head CT for age.  No acute intracranial abnormality. 2. No acute fracture or static subluxation of the cervical spine. Electronically Signed   By: Ulyses Jarred M.D.   On: 02/18/2017 03:41    Dg Hip Operative Unilat W Or W/o Pelvis Right  Result Date: 02/19/2017 CLINICAL DATA:  Intertrochanteric fracture RIGHT femur, ORIF EXAM: OPERATIVE RIGHT HIP (WITH PELVIS IF PERFORMED) 2 VIEWS TECHNIQUE: Fluoroscopic spot image(s) were submitted for interpretation post-operatively. COMPARISON:  None. FLUOROSCOPY TIME:  1 minutes 32 seconds FINDINGS: IM nail with proximal compression screw placed across a reduced intertrochanteric fracture of the proximal RIGHT femur. Bones appear demineralized. No dislocation identified. Distal extent of the IM nail is not imaged. IMPRESSION: Post ORIF of previously identified intertrochanteric fracture RIGHT femur. Electronically Signed   By: Lavonia Dana M.D.   On: 02/19/2017 16:59   Dg Hip Unilat  With Pelvis 2-3 Views Right  Result Date: 02/18/2017 CLINICAL DATA:  Fall tonight while going to the bathroom. Right hip pain, short and and rotated. EXAM: DG HIP (WITH OR WITHOUT PELVIS) 2-3V RIGHT COMPARISON:  None. FINDINGS: Displaced mildly comminuted intertrochanteric right hip fracture with displacement of the greater and lesser trochanteric. There is proximal migration of the femoral shaft. Femoral head remains seated in the acetabulum. Remainder the bony pelvis including pubic rami appear intact. Pubic symphysis and sacroiliac joints are congruent. Diffuse bony under mineralization. Postsurgical change in the lumbar spine and vertebral augmentation. IMPRESSION: Displaced mildly comminuted intertrochanteric right hip fracture with proximal migration of the femoral shaft. Electronically Signed   By: Jeb Levering M.D.   On: 02/18/2017 02:43    Assessment/Plan 1. Closed fracture of right hip with routine healing, subsequent encounter  Continue PT/OT  Continue exercises as taught by PT/OT  Continue APAP 500 mg po QID  Continue  Lovenox 40 mg SQ Q Day  Skin care and dressing changes daily and prn to keep wound dry  Continue Hydrocodone-APAP 5-325 mg po Q 6  hours prn moderate pain  Continue Oxymorphone 5 mg 1 tablet po Q 4 hours  2. Tachycardia  Metoprolol 25 mg po Q Day  3. Protein-calorie malnutrition, unspecified severity (HCC)  Pro-stat 30 mL PO BID  Ensure Enlive 1 bottle/ can po BID  Family/ staff Communication:   Total Time:  Documentation:  Face to Face:  Family/Phone:   Labs/tests ordered:  Cbc, met c  Medication list reviewed and assessed for continued appropriateness.  Vikki Ports, NP-C [facility]iatric[facility] [phone], [place] [address] Cell Phone (Mon-Fri 8am-5pm):  [phone] On Call:  [phone] & follow prompts after 5pm & weekends Office Phone:  [phone] Office Fax:  [phone]

## 2017-02-27 ENCOUNTER — Other Ambulatory Visit: Payer: Self-pay

## 2017-02-27 MED ORDER — HYDROCODONE-ACETAMINOPHEN 5-325 MG PO TABS: 1.0000 | ORAL_TABLET | Freq: Four times a day (QID) | ORAL | 0 refills | Status: DC | PRN

## 2017-02-27 NOTE — Telephone Encounter (Signed)
Rx sent to [facility] phone : [phone] , fax : [phone]  

## 2017-03-06 ENCOUNTER — Non-Acute Institutional Stay (SKILLED_NURSING_FACILITY): Payer: Medicare Other | Admitting: Gerontology

## 2017-03-06 ENCOUNTER — Other Ambulatory Visit: Payer: Self-pay

## 2017-03-06 ENCOUNTER — Encounter: Payer: Self-pay | Admitting: Gerontology

## 2017-03-06 DIAGNOSIS — S52021D Displaced fracture of olecranon process without intraarticular extension of right ulna, subsequent encounter for closed fracture with routine healing: Secondary | ICD-10-CM | POA: Diagnosis not present

## 2017-03-06 MED ORDER — OXYMORPHONE HCL 5 MG PO TABS: 5.0000 mg | ORAL_TABLET | ORAL | 0 refills | Status: DC | PRN

## 2017-03-06 NOTE — Telephone Encounter (Signed)
Rx sent to [facility] phone : [phone] , fax : [phone]  

## 2017-03-06 NOTE — Progress Notes (Signed)
Location:   [facility] Room Number: 207A Place of Service:  SNF [id number]) Provider:  Lorenso Quarry, NP-C  Lauro Regulus, MD  Patient Care Team: Lauro Regulus, MD as PCP - General (Internal Medicine) Mertie Moores, MD as Consulting Physician (Specialist)  Extended Emergency Contact Information Primary Emergency Contact: [name] Address: [address]          [place], [place] [address] [place] Home Phone: [phone] Mobile Phone: [phone] Relation: Spouse Secondary Emergency Contact: Tufo,Christopher Address: [address]          [place], [place] [address] [place] Home Phone: [phone] Relation: Son  Code Status:  Full Goals of care: Advanced Directive information Advanced Directives 03/06/2017  Does Patient Have a Medical Advance Directive? No  Type of Advance Directive -  Copy of Healthcare Power of Attorney in Chart? -  Would patient like information on creating a medical advance directive? -  Pre-existing out of facility DNR order (yellow form or pink MOST form) -     Chief Complaint  Patient presents with  . Acute Visit    Wound drainage    HPI:  Pt is a 71 y.o. female seen today for acute visit r/t excessive drainage from elbow wound. Nursing called me to the room to assess drainage from the elbow. Elbow had been in a ace-wrap splint. During the last dressing change, nursing noted a scab on the olecranon process, likely d/t irritation from the plaster splinting. Santyl was applied with a damp to dry gauze dressing. Today, the elbow wound has copious amounts of serosanguinous, malodorous drainage. The drainage has saturated the gauze and acewrap that was in place. On the tip of the elbow, skin appears macerated and is dehisced. Bone and hardware is exposed. Pt denies pain at the site. Area cleansed with NS, dried with sterile gauze. Petroleum gauze applied, covered with Telfa  and 2- ABD pads. Wrapped with Kerlix and an Acewrap for support. Orders given for IV placement, IV antibiotics and immediate notification to the Orthopedist. Explained to pt the findings. Pt agreeable with plan for IVabt and notify surgeon. Dr Dareen Piano updated with findings. VSS. No other complaints.       Past Medical History:  Diagnosis Date  . Anxiety   . Arthritis   . Cataract   . Cervicalgia   . Chronic lower back pain   . Closed compression fracture of L1 lumbar vertebra (HCC) 11/2012  . Collagen vascular disease (HCC)   . COPD (chronic obstructive pulmonary disease) (HCC)    unspecified  . DDD (degenerative disc disease)   . Depression   . Diabetes mellitus (HCC)   . Disorder of bursae and tendons in shoulder region    unspecified  . Fall 09/27/2016   Fall Feb 04, 2016 with broken vertebra  . GERD (gastroesophageal reflux disease)   . Headache   . Hereditary and idiopathic peripheral neuropathy    unspecified  . History of adenomatous polyp of colon    Followed by Dr. Markham Jordan  . History of tubal ligation   . Hypertension   . Intermediate coronary syndrome (HCC)   . Lumbago   . Neuralgia and neuritis, unspecified    radiculitis unspecified  . Rheumatoid arthritis (HCC)   . Rheumatoid arthritis (HCC)   . Senile osteoporosis   . Spinal stenosis, lumbar region without neurogenic claudication    Past Surgical History:  Procedure Laterality Date  .  BACK SURGERY    . BREAST BIOPSY Left    negative  . CATARACT EXTRACTION W/ INTRAOCULAR LENS  IMPLANT, BILATERAL Bilateral   . COLONOSCOPY  08/17/2007   Adenomatous Polyps  . COLONOSCOPY  10/09/2010   PH Adenomatous polyps : [facility] 09/2015 ; Recall Ltr mailed 08/17/2015 (dw)  . ESOPHAGOGASTRODUODENOSCOPY  01/14/2008   No repeat per RTE  . FINGER ARTHROPLASTY Right 09/15/2014   Procedure: RIGHT MIDDLE FINGER, RING FINGER, SMALL FINGER EXTENSOR DIGITORUM COMMUMIS STABILIZATION WITH RIGHT MIDDLE FINGER MCP ARTHROPLASTY, POSSIBLE RING  FINGER AND SMALL FINGER MCP ARTHROPLASTY;  Surgeon: Dominica Severin, MD;  Location: [facility] OR;  Service: Orthopedics;  Laterality: Right;  . FOOT SURGERY Bilateral   . HAND TENDON SURGERY Right 09/15/2014   "d/t RA"  . HUMERUS FRACTURE SURGERY Left   . INTRAMEDULLARY (IM) NAIL INTERTROCHANTERIC Right 02/19/2017   Procedure: INTRAMEDULLARY (IM) NAIL INTERTROCHANTRIC;  Surgeon: Donato Heinz, MD;  Location: [facility] ORS;  Service: Orthopedics;  Laterality: Right;  . KYPHOPLASTY N/A 01/04/2013   Procedure: L1 Kyphoplasty;  Surgeon: Hewitt Shorts, MD;  Location: [facility] NEURO ORS;  Service: Neurosurgery;  Laterality: N/A;  L1 Kyphoplasty  . KYPHOPLASTY N/A 02/07/2014   Procedure: THORACIC SIX KYPHOPLASTY;  Surgeon: Hewitt Shorts, MD;  Location: [facility] NEUR[facility] ORS;  Service: Neurosurgery;  Laterality: N/A;  T6 Kyphoplasty   . KYPHOPLASTY N/A 02/08/2016   Procedure: KYPHOPLASTY  T-12;  Surgeon: Kennedy Bucker, MD;  Location: [facility] ORS;  Service: Orthopedics;  Laterality: N/A;  . KYPHOPLASTY N/A 10/01/2016   Procedure: KYPHOPLASTY  L-5;  Surgeon: Kennedy Bucker, MD;  Location: [facility] ORS;  Service: Orthopedics;  Laterality: N/A;  . LUMBAR DISC SURGERY  2014    X 3  . LUMBAR LAMINECTOMY    . ORIF ELBOW FRACTURE Right 02/19/2017   Procedure: OPEN REDUCTION INTERNAL FIXATION (ORIF) ELBOW/OLECRANON FRACTURE;  Surgeon: Donato Heinz, MD;  Location: [facility] ORS;  Service: Orthopedics;  Laterality: Right;  . PERIPHERAL VASCULAR CATHETERIZATION Left 12/07/2015   Procedure: Upper Extremity Angiography;  Surgeon: Annice Needy, MD;  Location: [facility] INVASIVE CV LAB;  Service: Cardiovascular;  Laterality: Left;  . PERIPHERAL VASCULAR CATHETERIZATION  12/07/2015   Procedure: Upper Extremity Intervention;  Surgeon: Annice Needy, MD;  Location: [facility] INVASIVE CV LAB;  Service: Cardiovascular;;  . POSTERIOR LUMBAR FUSION     "got screws in"  . TONSILLECTOMY    . TUBAL LIGATION    . WRIST FUSION Right    30 yrs ago    Allergies  Allergen  Reactions  . Ambien [Zolpidem Tartrate]     Over sedation   . Gold-Containing Drug Products Anaphylaxis  . Adhesive [Tape]     Tears skin  . Dilaudid [Hydromorphone Hcl]     Made tongue swell  . Haldol [Haloperidol] Other (See Comments)    Hallucinations    Allergies as of 03/06/2017      Reactions   Ambien [zolpidem Tartrate]    Over sedation    Gold-containing Drug Products Anaphylaxis   Adhesive [tape]    Tears skin   Dilaudid [hydromorphone Hcl]    Made tongue swell   Haldol [haloperidol] Other (See Comments)   Hallucinations      Medication List       Accurate as of 03/06/17  3:55 PM. Always use your most recent med list.          acetaminophen 500 MG tablet Commonly known as:  TYLENOL Take 500 mg by mouth 4 (four) times daily.  albuterol 108 (90 Base) MCG/ACT inhaler Commonly known as:  PROVENTIL HFA;VENTOLIN HFA Inhale 2 puffs into the lungs every 6 (six) hours as needed for wheezing or shortness of breath.   bisacodyl 10 MG suppository Commonly known as:  DULCOLAX Place 1 suppository (10 mg total) rectally daily as needed for moderate constipation.   BREO ELLIPTA 100-25 MCG/INH Aepb Generic drug:  fluticasone furoate-vilanterol Inhale 1 puff into the lungs every morning.   Calcium Carbonate-Vitamin D 600-400 MG-UNIT tablet Take 1 tablet by mouth 2 (two) times daily.   Cholecalciferol 4000 units Caps Take 1 capsule by mouth daily.   DULoxetine 60 MG capsule Commonly known as:  CYMBALTA Take 60 mg by mouth every morning. *Note dose*   DULoxetine 30 MG capsule Commonly known as:  CYMBALTA Take 30 mg by mouth at bedtime. *Note dose*   enoxaparin 40 MG/0.4ML injection Commonly known as:  LOVENOX Inject 0.4 mLs (40 mg total) into the skin daily.   ENSURE ENLIVE PO Take 1 Bottle by mouth 2 (two) times daily between meals.   feeding supplement (PRO-STAT SUGAR FREE 64) Liqd Take 30 mLs by mouth 2 (two) times daily between meals.   ferrous sulfate  325 (65 FE) MG tablet Take 1 tablet (325 mg total) by mouth 2 (two) times daily with a meal.   FLECTOR 1.3 % Ptch Generic drug:  diclofenac Apply 1 patch topically 2 (two) times daily.   folic acid 1 MG tablet Commonly known as:  FOLVITE Take 1 mg by mouth daily.   gabapentin 300 MG capsule Commonly known as:  NEURONTIN Take 600 mg by mouth at bedtime. 3 caps   gabapentin 300 MG capsule Commonly known as:  NEURONTIN Take 300 mg by mouth every 4 hours prn; May take an additional (300 mg) by mouth 4 times daily as needed for pain   HYDROcodone-acetaminophen 5-325 MG tablet Commonly known as:  NORCO/VICODIN Take 1 tablet by mouth every 6 (six) hours as needed for moderate pain.   ibuprofen 200 MG tablet Commonly known as:  ADVIL,MOTRIN Take 400 mg by mouth 3 (three) times daily. Reported on 12/07/2015   METHOTREXATE SODIUM (PF) IJ Inject 0.8 mLs into the skin once a week. Methotrexate sodium solution (25 mg/ ml ) Give 0.8 ml on Monday   metoprolol tartrate 25 MG tablet Commonly known as:  LOPRESSOR Take 25 mg by mouth 2 (two) times daily.   omeprazole 20 MG capsule Commonly known as:  PRILOSEC Take 20 mg by mouth 2 (two) times daily.   oxymorphone 5 MG tablet Commonly known as:  OPANA Take 1 tablet (5 mg total) by mouth every 4 (four) hours as needed for pain.   polyethylene glycol packet Commonly known as:  MIRALAX / GLYCOLAX Take 17 g by mouth daily.   pravastatin 10 MG tablet Commonly known as:  PRAVACHOL Take 10 mg by mouth at bedtime.   QUEtiapine 100 MG tablet Commonly known as:  SEROQUEL Take 1.5 tablets (150 mg total) by mouth at bedtime.   senna 8.6 MG tablet Commonly known as:  SENOKOT Take 1 tablet by mouth 2 (two) times daily.   trolamine salicylate 10 % cream Commonly known as:  ASPERCREME Apply 1 application topically 2 (two) times daily as needed for muscle pain.   vitamin C 500 MG tablet Commonly known as:  ASCORBIC ACID Take 500 mg by mouth  daily.       Review of Systems  Constitutional: Positive for appetite change. Negative for activity  change, chills, diaphoresis and fever.  Respiratory: Negative for apnea, cough, choking, chest tightness, shortness of breath and wheezing.   Cardiovascular: Negative for chest pain, palpitations and leg swelling.  Gastrointestinal: Negative for abdominal distention, abdominal pain, constipation, diarrhea and nausea.  Genitourinary: Negative for difficulty urinating, dysuria, frequency and urgency.  Musculoskeletal: Positive for arthralgias (typical arthritis), back pain, gait problem and myalgias.  Skin: Positive for wound. Negative for color change, pallor and rash.  Neurological: Negative for dizziness, tremors, syncope, speech difficulty, weakness, numbness and headaches.  Psychiatric/Behavioral: Negative for agitation and behavioral problems.  All other systems reviewed and are negative.   Immunization History  Administered Date(s) Administered  . Influenza Split 05/02/2014  . Influenza-Unspecified 04/07/2012, 04/22/2013, 03/29/2014  . PPD Test 02/09/2016  . Pneumococcal Polysaccharide-23 10/23/2009  . Tdap 12/19/2010   Pertinent  Health Maintenance Due  Topic Date Due  . FOOT EXAM  10/16/1955  . OPHTHALMOLOGY EXAM  10/16/1955  . URINE MICROALBUMIN  10/16/1955  . COLONOSCOPY  10/16/1995  . DEXA SCAN  10/16/2010  . PNA vac Low Risk Adult (1 of 2 - PCV13) 10/24/2010  . INFLUENZA VACCINE  02/26/2017  . HEMOGLOBIN A1C  08/23/2017  . MAMMOGRAM  06/27/2018   No flowsheet data found. Functional Status Survey:    Vitals:   03/06/17 1542  BP: 125/65  Pulse: 84  Resp: 20  Temp: 98.4 F (36.9 C)  SpO2: 96%  Weight: 137 lb 3.2 oz (62.2 kg)  Height: 5\' 2"  (1.575 m)   Body mass index is 25.09 kg/m. Physical Exam  Constitutional: She is oriented to person, place, and time. Vital signs are normal. She appears well-developed and well-nourished. She is active and cooperative.  She does not appear ill. No distress.  HENT:  Head: Normocephalic and atraumatic.  Mouth/Throat: Uvula is midline, oropharynx is clear and moist and mucous membranes are normal. Mucous membranes are not pale, not dry and not cyanotic.  Eyes: Pupils are equal, round, and reactive to light. Conjunctivae, EOM and lids are normal.  Neck: Trachea normal, normal range of motion and full passive range of motion without pain. Neck supple. No JVD present. No tracheal deviation, no edema and no erythema present. No thyromegaly present.  Cardiovascular: Regular rhythm, normal heart sounds, intact distal pulses and normal pulses.  Tachycardia present.  Exam reveals no gallop, no distant heart sounds and no friction rub.   No murmur heard. Pulses:      Dorsalis pedis pulses are 2+ on the right side, and 2+ on the left side.  Pulmonary/Chest: Effort normal and breath sounds normal. No accessory muscle usage. No respiratory distress. She has no wheezes. She has no rales. She exhibits no tenderness.  Abdominal: Soft. Normal appearance and bowel sounds are normal. She exhibits no distension and no ascites. There is no tenderness.  Musculoskeletal: She exhibits no edema.       Right elbow: She exhibits decreased range of motion and swelling. Tenderness found. Olecranon process (hardware exposure) tenderness noted.       Right hip: She exhibits decreased range of motion, decreased strength, tenderness, swelling and laceration.  Expected osteoarthritis, stiffness, B- calves soft, supple. Negative Homan's sign  Neurological: She is alert and oriented to person, place, and time. She has normal strength.  Skin: Skin is warm and dry. Laceration (Right hip) noted. She is not diaphoretic. No cyanosis. No pallor. Nails show no clubbing.  Right hip incision with drainage. No signs of infection. Right elbow acewrap splint in place  Psychiatric: She has a normal mood and affect. Her speech is normal and behavior is normal.  Judgment and thought content normal. Cognition and memory are normal.  Nursing note and vitals reviewed.   Labs reviewed:  Recent Labs  02/20/17 0440 02/21/17 0452 02/25/17 0605  NA 139 138 141  K 3.6 4.0 3.4*  CL 108 104 107  CO2 25 27 26   GLUCOSE 210* 133* 103*  BUN 11 15 12   CREATININE 0.52 0.57 0.51  CALCIUM 7.0* 7.7* 8.0*  MG 1.2* 2.1  --     Recent Labs  11/30/16 1325 02/18/17 0140 02/25/17 0605  AST 25 30 25   ALT 15 16 10*  ALKPHOS 131* 134* 128*  BILITOT 0.7 0.5 0.8  PROT 8.6* 7.2 5.7*  ALBUMIN 3.9 2.9* 2.1*    Recent Labs  02/18/17 0140  02/21/17 0452 02/21/17 1556 02/25/17 0605  WBC 18.6*  < > 14.7* 14.9* 9.5  NEUTROABS 14.7*  --   --   --  6.1  HGB 11.5*  < > 7.9* 9.5* 9.6*  HCT 33.4*  < > 23.7* 29.1* 29.1*  MCV 86.0  < > 86.9 85.9 87.2  PLT 662*  < > 600* 586* 603*  < > = values in this interval not displayed. No results found for: TSH Lab Results  Component Value Date   HGBA1C 6.5 (H) 02/20/2017   No results found for: CHOL, HDL, LDLCALC, LDLDIRECT, TRIG, CHOLHDL  Significant Diagnostic Results in last 30 days:  Dg Chest 1 View  Result Date: 02/18/2017 CLINICAL DATA:  Fall going to the bathroom tonight. EXAM: CHEST 1 VIEW COMPARISON:  Radiograph 10/28/2016 FINDINGS: The cardiomediastinal contours are normal. Atherosclerosis of the thoracic aorta. Vascular stent projects over the left mediastinum. Pulmonary vasculature is normal. Mild chronic bronchial thickening and hyperinflation. No consolidation, pleural effusion, or pneumothorax. Multiple thoracic vertebral compression fractures with vertebral augmentation. Remote right proximal humerus fracture. Bones are diffusely under mineralized. IMPRESSION: 1. No acute abnormality. Mild chronic bronchial thickening and hyperinflation suggesting COPD. 2. Thoracic aortic atherosclerosis. 3. Multiple compression fractures in the thoracic spine, some of which with vertebral augmentation. Remote right  proximal humerus fracture. Electronically Signed   By: 02/22/2017 M.D.   On: 02/18/2017 02:49   Dg Elbow Complete Right  Result Date: 02/18/2017 CLINICAL DATA:  Right elbow pain after fall going to the bathroom. EXAM: RIGHT ELBOW - COMPLETE 3+ VIEW COMPARISON:  None. FINDINGS: Displaced mid olecranon fracture. There is 11 mm distraction of posterior cortex. Articular offset of approximately 3-4 mm. There is a large joint effusion with lipohemarthrosis. Soft tissue edema posteriorly about the elbow. No additional acute fracture. IMPRESSION: Displaced mid olecranon fracture with large joint effusion. Electronically Signed   By: Rubye Oaks M.D.   On: 02/18/2017 02:46   Dg Knee 2 Views Right  Result Date: 02/18/2017 CLINICAL DATA:  All going to the bathroom, right knee pain. EXAM: RIGHT KNEE - 1-2 VIEW COMPARISON:  None. FINDINGS: Fibula is partially obscured on AP view due to overlapping osseous structures. No evidence of fracture, dislocation, or joint effusion. Diffuse bony under mineralization. Growth arrest line in the proximal tibia. Minimal tibiofemoral joint space narrowing. Alignment is maintained. Soft tissues are unremarkable. IMPRESSION: No fracture of the right knee allowing for limited visualization of the fibula. Electronically Signed   By: Rubye Oaks M.D.   On: 02/18/2017 02:45   Ct Head Wo Contrast  Result Date: 02/18/2017 CLINICAL DATA:  Fall EXAM: CT HEAD WITHOUT CONTRAST  CT CERVICAL SPINE WITHOUT CONTRAST TECHNIQUE: Multidetector CT imaging of the head and cervical spine was performed following the standard protocol without intravenous contrast. Multiplanar CT image reconstructions of the cervical spine were also generated. COMPARISON:  Head CT 11/10/2015 FINDINGS: CT HEAD FINDINGS Brain: No mass lesion, intraparenchymal hemorrhage or extra-axial collection. No evidence of acute cortical infarct. Brain parenchyma and CSF-containing spaces are normal for age. Vascular: No  hyperdense vessel or unexpected calcification. Skull: Normal visualized skull base, calvarium and extracranial soft tissues. Sinuses/Orbits: No sinus fluid levels or advanced mucosal thickening. No mastoid effusion. Normal orbits. CT CERVICAL SPINE FINDINGS Alignment: No static subluxation. Facets are aligned. Occipital condyles are normally positioned. Skull base and vertebrae: No acute fracture. Soft tissues and spinal canal: No prevertebral fluid or swelling. No visible canal hematoma. Disc levels: No advanced spinal canal or neural foraminal stenosis. Upper chest: No pneumothorax, pulmonary nodule or pleural effusion. Other: Normal visualized paraspinal cervical soft tissues. IMPRESSION: 1. Normal head CT for age.  No acute intracranial abnormality. 2. No acute fracture or static subluxation of the cervical spine. Electronically Signed   By: Deatra Robinson M.D.   On: 02/18/2017 03:41   Ct Cervical Spine Wo Contrast  Result Date: 02/18/2017 CLINICAL DATA:  Fall EXAM: CT HEAD WITHOUT CONTRAST CT CERVICAL SPINE WITHOUT CONTRAST TECHNIQUE: Multidetector CT imaging of the head and cervical spine was performed following the standard protocol without intravenous contrast. Multiplanar CT image reconstructions of the cervical spine were also generated. COMPARISON:  Head CT 11/10/2015 FINDINGS: CT HEAD FINDINGS Brain: No mass lesion, intraparenchymal hemorrhage or extra-axial collection. No evidence of acute cortical infarct. Brain parenchyma and CSF-containing spaces are normal for age. Vascular: No hyperdense vessel or unexpected calcification. Skull: Normal visualized skull base, calvarium and extracranial soft tissues. Sinuses/Orbits: No sinus fluid levels or advanced mucosal thickening. No mastoid effusion. Normal orbits. CT CERVICAL SPINE FINDINGS Alignment: No static subluxation. Facets are aligned. Occipital condyles are normally positioned. Skull base and vertebrae: No acute fracture. Soft tissues and spinal  canal: No prevertebral fluid or swelling. No visible canal hematoma. Disc levels: No advanced spinal canal or neural foraminal stenosis. Upper chest: No pneumothorax, pulmonary nodule or pleural effusion. Other: Normal visualized paraspinal cervical soft tissues. IMPRESSION: 1. Normal head CT for age.  No acute intracranial abnormality. 2. No acute fracture or static subluxation of the cervical spine. Electronically Signed   By: Deatra Robinson M.D.   On: 02/18/2017 03:41   Dg Hip Operative Unilat W Or W/o Pelvis Right  Result Date: 02/19/2017 CLINICAL DATA:  Intertrochanteric fracture RIGHT femur, ORIF EXAM: OPERATIVE RIGHT HIP (WITH PELVIS IF PERFORMED) 2 VIEWS TECHNIQUE: Fluoroscopic spot image(s) were submitted for interpretation post-operatively. COMPARISON:  None. FLUOROSCOPY TIME:  1 minutes 32 seconds FINDINGS: IM nail with proximal compression screw placed across a reduced intertrochanteric fracture of the proximal RIGHT femur. Bones appear demineralized. No dislocation identified. Distal extent of the IM nail is not imaged. IMPRESSION: Post ORIF of previously identified intertrochanteric fracture RIGHT femur. Electronically Signed   By: Ulyses Southward M.D.   On: 02/19/2017 16:59   Dg Hip Unilat  With Pelvis 2-3 Views Right  Result Date: 02/18/2017 CLINICAL DATA:  Fall tonight while going to the bathroom. Right hip pain, short and and rotated. EXAM: DG HIP (WITH OR WITHOUT PELVIS) 2-3V RIGHT COMPARISON:  None. FINDINGS: Displaced mildly comminuted intertrochanteric right hip fracture with displacement of the greater and lesser trochanteric. There is proximal migration of the femoral shaft. Femoral head remains  seated in the acetabulum. Remainder the bony pelvis including pubic rami appear intact. Pubic symphysis and sacroiliac joints are congruent. Diffuse bony under mineralization. Postsurgical change in the lumbar spine and vertebral augmentation. IMPRESSION: Displaced mildly comminuted  intertrochanteric right hip fracture with proximal migration of the femoral shaft. Electronically Signed   By: Rubye Oaks M.D.   On: 02/18/2017 02:43    Assessment/Plan 1. Closed fracture of right olecranon process   Insert and maintain peripheral IV for infusion of IV antibiotics  Rocephin 2 grams IV now and Q Day x 7 days  Clindamycin 600 mg IV Q 8 hours x 7 days d/t hardware exposure, osteomyelitis prevention  Refer back to Orthopedist for evaluation ASAP  Family/ staff Communication:  Total Time:  Documentation:  Face to Face:  Family/Phone:   Labs/tests ordered:    Medication list reviewed and assessed for continued appropriateness. Monthly medication orders reviewed and signed.  Brynda Rim, NP-C Geriatrics [facility] [phone] [address], [place] [address] Cell Phone (Mon-Fri 8am-5pm):  [phone] On Call:  [phone] & follow prompts after 5pm & weekends Office Phone:  [phone] Office Fax:  [phone]

## 2017-03-11 ENCOUNTER — Ambulatory Visit: Payer: Medicare Other | Admitting: Physician Assistant

## 2017-03-12 ENCOUNTER — Encounter: Payer: Self-pay | Admitting: Gerontology

## 2017-03-12 ENCOUNTER — Non-Acute Institutional Stay (SKILLED_NURSING_FACILITY): Payer: Medicare Other | Admitting: Gerontology

## 2017-03-12 DIAGNOSIS — R000 Tachycardia, unspecified: Secondary | ICD-10-CM

## 2017-03-12 DIAGNOSIS — S52021D Displaced fracture of olecranon process without intraarticular extension of right ulna, subsequent encounter for closed fracture with routine healing: Secondary | ICD-10-CM

## 2017-03-12 DIAGNOSIS — S72001D Fracture of unspecified part of neck of right femur, subsequent encounter for closed fracture with routine healing: Secondary | ICD-10-CM | POA: Diagnosis not present

## 2017-03-12 DIAGNOSIS — E46 Unspecified protein-calorie malnutrition: Secondary | ICD-10-CM | POA: Diagnosis not present

## 2017-03-12 NOTE — Progress Notes (Signed)
Location:   [facility] Room Number: 207A Place of Service:  SNF [phone]) Provider:  Toni Arthurs, NP-C  Kirk Ruths, MD  Patient Care Team: Kirk Ruths, MD as PCP - General (Internal Medicine) Erby Pian, MD as Consulting Physician (Specialist)  Extended Emergency Contact Information Primary Emergency Contact: Valeria Batman Address: [address]          [place], [place] [address] [place] of [place] Phone: [phone] Mobile Phone: [phone] Relation: Spouse Secondary Emergency Contact: Meikle,Christopher Address: [address]          [place], [place] [address] [place] Phone: [phone] Relation: Son  Code Status:  FULL Goals of care: Advanced Directive information Advanced Directives 03/12/2017  Does Patient Have a Medical Advance Directive? No  Type of Advance Directive -  [place] in Chart? -  Would patient like information on creating a medical advance directive? -  Pre-existing out of facility DNR order (yellow form or pink MOST form) -     Chief Complaint  Patient presents with  . Medical Management of Chronic Issues    Routine Visit    HPI:  Pt is a 71 y.o. female seen today for follow up. Pt was admitted to the facility for rehab following hospitalization for fall with right hip and right olecranon process fracture. Pt has been on IV antibiotics for hardware exposure of the elbow. She has since been evaluated by the orthopedist who does not feel that further surgical intervention is warranted at this time. Small, portable wound vac is in place with acewrap and sling. Pt reports pain is controlled. Right hip incision is no longer draining. No redness, no warmth. Calves soft, supple. Pt is working with PT/OT. She has been on nutritional supplements for the protein-calorie malnutrition to aid with wound healing. Tachycardia had improved with low dose  metoprolol. Pt now has a LUE Mid-line in place for infusion of the IV antibiotics. Pt's veins are small and friable and a peripheral line infiltrated multiple time. Midline was "snagged" last night with the call bell. Some old bleeding at the insertion site, but no pain. Nursing reports it flushes well. Overall, pt reports she feels she is doing better. She reports pain is well controlled. Appetite is still poor, but somewhat improved. Pt denies n/v/d/f/c/cp/sob/ha/abd pain/dizziness/cough. Coarse of abt due to be completed in a few days. Will recheck labs. Otherwise, VSS. No other complaints.      Past Medical History:  Diagnosis Date  . Anxiety   . Arthritis   . Cataract   . Cervicalgia   . Chronic lower back pain   . Closed compression fracture of L1 lumbar vertebra ([place]) 11/2012  . Collagen vascular disease ([place])   . COPD (chronic obstructive pulmonary disease) ([place])    unspecified  . DDD (degenerative disc disease)   . Depression   . Diabetes mellitus ([place])   . Disorder of bursae and tendons in shoulder region    unspecified  . Fall 09/27/2016   Fall Feb 04, 2016 with broken vertebra  . GERD (gastroesophageal reflux disease)   . Headache   . Hereditary and idiopathic peripheral neuropathy    unspecified  . History of adenomatous polyp of colon    Followed by Dr. Tiffany Kocher  . History of tubal ligation   . Hypertension   . Intermediate coronary syndrome ([place])   . Lumbago   . Neuralgia and  neuritis, unspecified    radiculitis unspecified  . Rheumatoid arthritis ([place])   . Rheumatoid arthritis ([place])   . Senile osteoporosis   . Spinal stenosis, lumbar region without neurogenic claudication    Past Surgical History:  Procedure Laterality Date  . BACK SURGERY    . BREAST BIOPSY Left    negative  . CATARACT EXTRACTION W/ INTRAOCULAR LENS  IMPLANT, BILATERAL Bilateral   . COLONOSCOPY  08/17/2007   Adenomatous Polyps  . COLONOSCOPY  10/09/2010   PH Adenomatous polyps : [facility] 09/2015  ; Recall Ltr mailed 08/17/2015 (dw)  . ESOPHAGOGASTRODUODENOSCOPY  01/14/2008   No repeat per RTE  . FINGER ARTHROPLASTY Right 09/15/2014   Procedure: RIGHT MIDDLE FINGER, RING FINGER, SMALL FINGER EXTENSOR DIGITORUM COMMUMIS STABILIZATION WITH RIGHT MIDDLE FINGER MCP ARTHROPLASTY, POSSIBLE RING FINGER AND SMALL FINGER MCP ARTHROPLASTY;  Surgeon: Roseanne Kaufman, MD;  Location: [facility];  Service: Orthopedics;  Laterality: Right;  . FOOT SURGERY Bilateral   . HAND TENDON SURGERY Right 09/15/2014   "d/t RA"  . HUMERUS FRACTURE SURGERY Left   . INTRAMEDULLARY (IM) NAIL INTERTROCHANTERIC Right 02/19/2017   Procedure: INTRAMEDULLARY (IM) NAIL INTERTROCHANTRIC;  Surgeon: Dereck Leep, MD;  Location: [facility];  Service: Orthopedics;  Laterality: Right;  . KYPHOPLASTY N/A 01/04/2013   Procedure: L1 Kyphoplasty;  Surgeon: Hosie Spangle, MD;  Location: [place] [facility];  Service: Neurosurgery;  Laterality: N/A;  L1 Kyphoplasty  . KYPHOPLASTY N/A 02/07/2014   Procedure: THORACIC SIX KYPHOPLASTY;  Surgeon: Hosie Spangle, MD;  Location: [place];  Service: Neurosurgery;  Laterality: N/A;  T6 Kyphoplasty   . KYPHOPLASTY N/A 02/08/2016   Procedure: KYPHOPLASTY  T-12;  Surgeon: Hessie Knows, MD;  Location: [facility];  Service: Orthopedics;  Laterality: N/A;  . KYPHOPLASTY N/A 10/01/2016   Procedure: KYPHOPLASTY  L-5;  Surgeon: Hessie Knows, MD;  Location: [facility];  Service: Orthopedics;  Laterality: N/A;  . LUMBAR [place] SURGERY  2014    X 3  . LUMBAR LAMINECTOMY    . ORIF ELBOW FRACTURE Right 02/19/2017   Procedure: OPEN REDUCTION INTERNAL FIXATION (ORIF) ELBOW/OLECRANON FRACTURE;  Surgeon: Dereck Leep, MD;  Location: [facility];  Service: Orthopedics;  Laterality: Right;  . PERIPHERAL VASCULAR CATHETERIZATION Left 12/07/2015   Procedure: Upper Extremity Angiography;  Surgeon: Algernon Huxley, MD;  Location: [facility];  Service: Cardiovascular;  Laterality: Left;  . PERIPHERAL VASCULAR CATHETERIZATION   12/07/2015   Procedure: Upper Extremity Intervention;  Surgeon: Algernon Huxley, MD;  Location: [facility];  Service: Cardiovascular;;  . POSTERIOR LUMBAR FUSION     "got screws in"  . TONSILLECTOMY    . TUBAL LIGATION    . WRIST FUSION Right    30 yrs ago    Allergies  Allergen Reactions  . Ambien [Zolpidem Tartrate]     Over sedation   . Gold-Containing Drug Products Anaphylaxis  . Adhesive [Tape]     Tears skin  . Dilaudid [Hydromorphone Hcl]     Made tongue swell  . Haldol [Haloperidol] Other (See Comments)    Hallucinations    Allergies as of 03/12/2017      Reactions   Ambien [zolpidem Tartrate]    Over sedation    Gold-containing Drug Products Anaphylaxis   Adhesive [tape]    Tears skin   Dilaudid [hydromorphone Hcl]    Made tongue swell   Haldol [haloperidol] Other (See Comments)   Hallucinations      Medication List  Accurate as of 03/12/17  2:20 PM. Always use your most recent med list.          acetaminophen 500 MG tablet Commonly known as:  TYLENOL Take 500 mg by mouth 4 (four) times daily.   albuterol 108 (90 Base) MCG/ACT inhaler Commonly known as:  PROVENTIL HFA;VENTOLIN HFA Inhale 2 puffs into the lungs every 6 (six) hours as needed for wheezing or shortness of breath.   bisacodyl 10 MG suppository Commonly known as:  DULCOLAX Place 1 suppository (10 mg total) rectally daily as needed for moderate constipation.   BREO ELLIPTA 100-25 MCG/INH Aepb Generic drug:  fluticasone furoate-vilanterol Inhale 1 puff into the lungs every morning.   Calcium Carbonate-Vitamin D 600-400 MG-UNIT tablet Take 1 tablet by mouth 2 (two) times daily.   Cholecalciferol 4000 units Caps Take 1 capsule by mouth daily.   DULoxetine 60 MG capsule Commonly known as:  CYMBALTA Take 60 mg by mouth every morning. *Note dose*   DULoxetine 30 MG capsule Commonly known as:  CYMBALTA Take 30 mg by mouth at bedtime. *Note dose*   enoxaparin 40 MG/0.4ML  injection Commonly known as:  LOVENOX Inject 0.4 mLs (40 mg total) into the skin daily.   ENSURE ENLIVE PO Take 1 Bottle by mouth 2 (two) times daily between meals.   feeding supplement (PRO-STAT SUGAR FREE 64) Liqd Take 30 mLs by mouth 2 (two) times daily between meals.   ferrous sulfate 325 (65 FE) MG tablet Take 1 tablet (325 mg total) by mouth 2 (two) times daily with a meal.   FLECTOR 1.3 % Ptch Generic drug:  diclofenac Apply 1 patch topically 2 (two) times daily.   folic acid 1 MG tablet Commonly known as:  FOLVITE Take 1 mg by mouth daily.   gabapentin 300 MG capsule Commonly known as:  NEURONTIN Take 600 mg by mouth at bedtime. 3 caps   gabapentin 300 MG capsule Commonly known as:  NEURONTIN Take 300 mg by mouth every 4 hours prn; May take an additional (300 mg) by mouth 4 times daily as needed for pain   HYDROcodone-acetaminophen 5-325 MG tablet Commonly known as:  NORCO/VICODIN Take 1 tablet by mouth every 6 (six) hours as needed for moderate pain.   ibuprofen 200 MG tablet Commonly known as:  ADVIL,MOTRIN Take 400 mg by mouth 3 (three) times daily. Reported on 12/07/2015   loperamide 2 MG tablet Commonly known as:  IMODIUM A-D Give 2 tablets (4 mg) by mouth with first loose stool, then take 1 tablet (2 mg) by mouth with each subsequent loose stool up to 8 doses in 24 hours as needed   METHOTREXATE SODIUM (PF) IJ Inject 0.8 mLs into the skin once a week. Methotrexate sodium solution (25 mg/ ml ) Give 0.8 ml on Monday   metoprolol tartrate 25 MG tablet Commonly known as:  LOPRESSOR Take 25 mg by mouth 2 (two) times daily.   omeprazole 20 MG capsule Commonly known as:  PRILOSEC Take 20 mg by mouth 2 (two) times daily.   oxymorphone 5 MG tablet Commonly known as:  OPANA Take 1 tablet (5 mg total) by mouth every 4 (four) hours as needed for pain.   polyethylene glycol packet Commonly known as:  MIRALAX / GLYCOLAX Take 17 g by mouth daily.     pravastatin 10 MG tablet Commonly known as:  PRAVACHOL Take 10 mg by mouth at bedtime.   QUEtiapine 100 MG tablet Commonly known as:  SEROQUEL Take 1.5 tablets (  150 mg total) by mouth at bedtime.   senna 8.6 MG tablet Commonly known as:  SENOKOT Take 2 tablets by mouth 2 (two) times daily.   trolamine salicylate 10 % cream Commonly known as:  ASPERCREME Apply 1 application topically 2 (two) times daily as needed for muscle pain.   vitamin C 500 MG tablet Commonly known as:  ASCORBIC ACID Take 500 mg by mouth daily.       Review of Systems  Constitutional: Positive for appetite change. Negative for activity change, chills, diaphoresis and fever.  Respiratory: Negative for apnea, cough, choking, chest tightness, shortness of breath and wheezing.   Cardiovascular: Negative for chest pain, palpitations and leg swelling.  Gastrointestinal: Negative for abdominal distention, abdominal pain, constipation, diarrhea and nausea.  Genitourinary: Negative for difficulty urinating, dysuria, frequency and urgency.  Musculoskeletal: Positive for arthralgias (typical arthritis), back pain, gait problem and myalgias.  Skin: Positive for wound. Negative for color change, pallor and rash.  Neurological: Negative for dizziness, tremors, syncope, speech difficulty, weakness, numbness and headaches.  Psychiatric/Behavioral: Negative for agitation and behavioral problems.  All other systems reviewed and are negative.   Immunization History  Administered Date(s) Administered  . Influenza Split 05/02/2014  . Influenza-Unspecified 04/07/2012, 04/22/2013, 03/29/2014  . PPD Test 02/09/2016  . Pneumococcal Polysaccharide-23 10/23/2009  . Tdap 12/19/2010   Pertinent  Health Maintenance Due  Topic Date Due  . FOOT EXAM  10/16/1955  . OPHTHALMOLOGY EXAM  [birth year]16/1955  . URINE MICROALBUMIN  10[birth year]16/1955  . COLONOSCOPY  10/16/1995  . DEXA SCAN  10/16/2010  . PNA vac Low Risk Adult (1 of 2 - PCV13)  10/24/2010  . INFLUENZA VACCINE  02/26/2017  . HEMOGLOBIN A1C  08/23/2017  . MAMMOGRAM  06/27/2018   No flowsheet data found. Functional Status Survey:    Vitals:   03/12/17 1401  BP: (!) 149/64  Pulse: 82  Resp: 20  Temp: 98.3 F (36.8 C)  SpO2: 97%  Weight: 134 lb 14.4 oz (61.2 kg)  Height: 5' 2"  (1.575 m)   Body mass index is 24.67 kg/m. Physical Exam  Constitutional: She is oriented to person, place, and time. Vital signs are normal. She appears well-developed and well-nourished. She is active and cooperative. She does not appear ill. No distress.  HENT:  Head: Normocephalic and atraumatic.  Mouth/Throat: Uvula is midline, oropharynx is clear and moist and mucous membranes are normal. Mucous membranes are not pale, not dry and not cyanotic.  Eyes: Pupils are equal, round, and reactive to light. Conjunctivae, EOM and lids are normal.  Neck: Trachea normal, normal range of motion and full passive range of motion without pain. Neck supple. No JVD present. No tracheal deviation, no edema and no erythema present. No thyromegaly present.  Cardiovascular: Regular rhythm, normal heart sounds, intact distal pulses and normal pulses.  Tachycardia present.  Exam reveals no gallop, no distant heart sounds and no friction rub.   No murmur heard. Pulses:      Dorsalis pedis pulses are 2+ on the right side, and 2+ on the left side.  Pulmonary/Chest: Effort normal and breath sounds normal. No accessory muscle usage. No respiratory distress. She has no wheezes. She has no rales. She exhibits no tenderness.  Abdominal: Soft. Normal appearance and bowel sounds are normal. She exhibits no distension and no ascites. There is no tenderness.  Musculoskeletal: She exhibits no edema.       Right elbow: She exhibits decreased range of motion and swelling. Tenderness found. No olecranon  process (hardware exposure- wound vac in place) tenderness noted.       Right hip: She exhibits decreased range of  motion, decreased strength, tenderness, swelling and laceration.  Expected osteoarthritis, stiffness, B- calves soft, supple. Negative Homan's sign  Neurological: She is alert and oriented to person, place, and time. She has normal strength.  Skin: Skin is warm and dry. Laceration (Right hip, right elbow- wound vac in place) noted. She is not diaphoretic. No cyanosis. No pallor. Nails show no clubbing.  Right hip incision with drainage. No signs of infection. Right elbow acewrap splint in place  Psychiatric: She has a normal mood and affect. Her speech is normal and behavior is normal. Judgment and thought content normal. Cognition and memory are normal.  Nursing note and vitals reviewed.   Labs reviewed:  Recent Labs  02/20/17 0440 02/21/17 0452 02/25/17 0605  NA 139 138 141  K 3.6 4.0 3.4*  CL 108 104 107  CO2 25 27 26   GLUCOSE 210* 133* 103*  BUN 11 15 12   CREATININE 0.52 0.57 0.51  CALCIUM 7.0* 7.7* 8.0*  MG 1.2* 2.1  --     Recent Labs  11/30/16 1325 02/18/17 0140 02/25/17 0605  AST 25 30 25   ALT 15 16 10*  ALKPHOS 131* 134* 128*  BILITOT 0.7 0.5 0.8  PROT 8.6* 7.2 5.7*  ALBUMIN 3.9 2.9* 2.1*    Recent Labs  02/18/17 0140  02/21/17 0452 02/21/17 1556 02/25/17 0605  WBC 18.6*  < > 14.7* 14.9* 9.5  NEUTROABS 14.7*  --   --   --  6.1  HGB 11.5*  < > 7.9* 9.5* 9.6*  HCT 33.4*  < > 23.7* 29.1* 29.1*  MCV 86.0  < > 86.9 85.9 87.2  PLT 662*  < > 600* 586* 603*  < > = values in this interval not displayed. No results found for: TSH Lab Results  Component Value Date   HGBA1C 6.5 (H) 02/20/2017   No results found for: CHOL, HDL, LDLCALC, LDLDIRECT, TRIG, CHOLHDL  Significant Diagnostic Results in last 30 days:  Dg Chest 1 View  Result Date: 02/18/2017 CLINICAL DATA:  Fall going to the bathroom tonight. EXAM: CHEST 1 VIEW COMPARISON:  Radiograph 10/28/2016 FINDINGS: The cardiomediastinal contours are normal. Atherosclerosis of the thoracic aorta. Vascular  stent projects over the left mediastinum. Pulmonary vasculature is normal. Mild chronic bronchial thickening and hyperinflation. No consolidation, pleural effusion, or pneumothorax. Multiple thoracic vertebral compression fractures with vertebral augmentation. Remote right proximal humerus fracture. Bones are diffusely under mineralized. IMPRESSION: 1. No acute abnormality. Mild chronic bronchial thickening and hyperinflation suggesting COPD. 2. Thoracic aortic atherosclerosis. 3. Multiple compression fractures in the thoracic spine, some of which with vertebral augmentation. Remote right proximal humerus fracture. Electronically Signed   By: Jeb Levering M.D.   On: 02/18/2017 02:49   Dg Elbow Complete Right  Result Date: 02/18/2017 CLINICAL DATA:  Right elbow pain after fall going to the bathroom. EXAM: RIGHT ELBOW - COMPLETE 3+ VIEW COMPARISON:  None. FINDINGS: Displaced mid olecranon fracture. There is 11 mm distraction of posterior cortex. Articular offset of approximately 3-4 mm. There is a large joint effusion with lipohemarthrosis. Soft tissue edema posteriorly about the elbow. No additional acute fracture. IMPRESSION: Displaced mid olecranon fracture with large joint effusion. Electronically Signed   By: Jeb Levering M.D.   On: 02/18/2017 02:46   Dg Knee 2 Views Right  Result Date: 02/18/2017 CLINICAL DATA:  All going to the bathroom, right  knee pain. EXAM: RIGHT KNEE - 1-2 VIEW COMPARISON:  None. FINDINGS: Fibula is partially obscured on AP view due to overlapping osseous structures. No evidence of fracture, dislocation, or joint effusion. Diffuse bony under mineralization. Growth arrest line in the proximal tibia. Minimal tibiofemoral joint space narrowing. Alignment is maintained. Soft tissues are unremarkable. IMPRESSION: No fracture of the right knee allowing for limited visualization of the fibula. Electronically Signed   By: Jeb Levering M.D.   On: 02/18/2017 02:45   Ct Head Wo  Contrast  Result Date: 02/18/2017 CLINICAL DATA:  Fall EXAM: CT HEAD WITHOUT CONTRAST CT CERVICAL SPINE WITHOUT CONTRAST TECHNIQUE: Multidetector CT imaging of the head and cervical spine was performed following the standard protocol without intravenous contrast. Multiplanar CT image reconstructions of the cervical spine were also generated. COMPARISON:  Head CT 11/10/2015 FINDINGS: CT HEAD FINDINGS Brain: No mass lesion, intraparenchymal hemorrhage or extra-axial collection. No evidence of acute cortical infarct. Brain parenchyma and CSF-containing spaces are normal for age. Vascular: No hyperdense vessel or unexpected calcification. Skull: Normal visualized skull base, calvarium and extracranial soft tissues. Sinuses/Orbits: No sinus fluid levels or advanced mucosal thickening. No mastoid effusion. Normal orbits. CT CERVICAL SPINE FINDINGS Alignment: No static subluxation. Facets are aligned. Occipital condyles are normally positioned. Skull base and vertebrae: No acute fracture. Soft tissues and spinal canal: No prevertebral fluid or swelling. No visible canal hematoma. Disc levels: No advanced spinal canal or neural foraminal stenosis. Upper chest: No pneumothorax, pulmonary nodule or pleural effusion. Other: Normal visualized paraspinal cervical soft tissues. IMPRESSION: 1. Normal head CT for age.  No acute intracranial abnormality. 2. No acute fracture or static subluxation of the cervical spine. Electronically Signed   By: Ulyses Jarred M.D.   On: 02/18/2017 03:41   Ct Cervical Spine Wo Contrast  Result Date: 02/18/2017 CLINICAL DATA:  Fall EXAM: CT HEAD WITHOUT CONTRAST CT CERVICAL SPINE WITHOUT CONTRAST TECHNIQUE: Multidetector CT imaging of the head and cervical spine was performed following the standard protocol without intravenous contrast. Multiplanar CT image reconstructions of the cervical spine were also generated. COMPARISON:  Head CT 11/10/2015 FINDINGS: CT HEAD FINDINGS Brain: No mass  lesion, intraparenchymal hemorrhage or extra-axial collection. No evidence of acute cortical infarct. Brain parenchyma and CSF-containing spaces are normal for age. Vascular: No hyperdense vessel or unexpected calcification. Skull: Normal visualized skull base, calvarium and extracranial soft tissues. Sinuses/Orbits: No sinus fluid levels or advanced mucosal thickening. No mastoid effusion. Normal orbits. CT CERVICAL SPINE FINDINGS Alignment: No static subluxation. Facets are aligned. Occipital condyles are normally positioned. Skull base and vertebrae: No acute fracture. Soft tissues and spinal canal: No prevertebral fluid or swelling. No visible canal hematoma. Disc levels: No advanced spinal canal or neural foraminal stenosis. Upper chest: No pneumothorax, pulmonary nodule or pleural effusion. Other: Normal visualized paraspinal cervical soft tissues. IMPRESSION: 1. Normal head CT for age.  No acute intracranial abnormality. 2. No acute fracture or static subluxation of the cervical spine. Electronically Signed   By: Ulyses Jarred M.D.   On: 02/18/2017 03:41   Dg Hip Operative Unilat W Or W/o Pelvis Right  Result Date: 02/19/2017 CLINICAL DATA:  Intertrochanteric fracture RIGHT femur, ORIF EXAM: OPERATIVE RIGHT HIP (WITH PELVIS IF PERFORMED) 2 VIEWS TECHNIQUE: Fluoroscopic spot image(s) were submitted for interpretation post-operatively. COMPARISON:  None. FLUOROSCOPY TIME:  1 minutes 32 seconds FINDINGS: IM nail with proximal compression screw placed across a reduced intertrochanteric fracture of the proximal RIGHT femur. Bones appear demineralized. No dislocation identified. Distal extent  of the IM nail is not imaged. IMPRESSION: Post ORIF of previously identified intertrochanteric fracture RIGHT femur. Electronically Signed   By: Lavonia Dana M.D.   On: 02/19/2017 16:59   Dg Hip Unilat  With Pelvis 2-3 Views Right  Result Date: 02/18/2017 CLINICAL DATA:  Fall tonight while going to the bathroom. Right  hip pain, short and and rotated. EXAM: DG HIP (WITH OR WITHOUT PELVIS) 2-3V RIGHT COMPARISON:  None. FINDINGS: Displaced mildly comminuted intertrochanteric right hip fracture with displacement of the greater and lesser trochanteric. There is proximal migration of the femoral shaft. Femoral head remains seated in the acetabulum. Remainder the bony pelvis including pubic rami appear intact. Pubic symphysis and sacroiliac joints are congruent. Diffuse bony under mineralization. Postsurgical change in the lumbar spine and vertebral augmentation. IMPRESSION: Displaced mildly comminuted intertrochanteric right hip fracture with proximal migration of the femoral shaft. Electronically Signed   By: Jeb Levering M.D.   On: 02/18/2017 02:43    Assessment/Plan 1. Closed fracture of right olecranon process   Maintain Midline IV for infusion of IV antibiotics  Rocephin 2 grams IV now and Q Day x 7 days total  Clindamycin 600 mg IV Q 8 hours x 7 days total d/t hardware exposure, osteomyelitis prevention  Follow up with Orthopedist as instructed  Continue Wound Vac as instructed by Ortho  2. Closed fracture of right hip with routine healing, subsequent encounter  Continue PT/OT  Continue exercises as taught by PT/OT  Continue APAP 500 mg po QID  Skin care and dressing changes daily and prn to keep wound dry  Continue Hydrocodone-APAP 5-325 mg po Q 6 hours prn moderate pain  Continue Oxymorphone 5 mg 1 tablet po Q 4 hours  2. Tachycardia  Metoprolol 25 mg po Q Day  3. Protein-calorie malnutrition, unspecified severity (HCC)  Pro-stat 30 mL PO BID  Ensure Enlive 1 bottle/ can po BID   Family/ staff Communication:   Total Time:  Documentation:  Face to Face:  Family/Phone:   Labs/tests ordered:  Cbc, met c  Medication list reviewed and assessed for continued appropriateness. Monthly medication orders reviewed and signed.  Vikki Ports, NP-C Geriatrics [facility] [phone], [place] [address] Cell Phone (Mon-Fri 8am-5pm):  [phone] On Call:  [phone] & follow prompts after 5pm & weekends Office Phone:  [phone] Office Fax:  [phone]

## 2017-03-13 DIAGNOSIS — R000 Tachycardia, unspecified: Secondary | ICD-10-CM | POA: Insufficient documentation

## 2017-03-13 DIAGNOSIS — S52021D Displaced fracture of olecranon process without intraarticular extension of right ulna, subsequent encounter for closed fracture with routine healing: Secondary | ICD-10-CM | POA: Insufficient documentation

## 2017-03-13 DIAGNOSIS — E46 Unspecified protein-calorie malnutrition: Secondary | ICD-10-CM | POA: Insufficient documentation

## 2017-03-14 ENCOUNTER — Other Ambulatory Visit
Admission: RE | Admit: 2017-03-14 | Discharge: 2017-03-14 | Disposition: A | Discharge status: Home / Self Care | Payer: Medicare Other | Source: Skilled Nursing Facility | Attending: Gerontology | Admitting: Gerontology

## 2017-03-14 DIAGNOSIS — Z77018 Contact with and (suspected) exposure to other hazardous metals: Secondary | ICD-10-CM | POA: Insufficient documentation

## 2017-03-14 LAB — COMPREHENSIVE METABOLIC PANEL
ALT: 10 U/L — AB (ref 14–54)
ANION GAP: 10 (ref 5–15)
AST: 27 U/L (ref 15–41)
Albumin: 2.3 g/dL — ABNORMAL LOW (ref 3.5–5.0)
Alkaline Phosphatase: 173 U/L — ABNORMAL HIGH (ref 38–126)
BUN: 8 mg/dL (ref 6–20)
CHLORIDE: 107 mmol/L (ref 101–111)
CO2: 25 mmol/L (ref 22–32)
CREATININE: 0.54 mg/dL (ref 0.44–1.00)
Calcium: 8.4 mg/dL — ABNORMAL LOW (ref 8.9–10.3)
Glucose, Bld: 65 mg/dL (ref 65–99)
POTASSIUM: 3.3 mmol/L — AB (ref 3.5–5.1)
Sodium: 142 mmol/L (ref 135–145)
Total Bilirubin: 0.5 mg/dL (ref 0.3–1.2)
Total Protein: 6.7 g/dL (ref 6.5–8.1)

## 2017-03-14 LAB — CBC WITH DIFFERENTIAL/PLATELET
Basophils Absolute: 0.1 10*3/uL (ref 0–0.1)
Basophils Relative: 1 %
EOS ABS: 0.6 10*3/uL (ref 0–0.7)
EOS PCT: 7 %
HCT: 32.7 % — ABNORMAL LOW (ref 35.0–47.0)
Hemoglobin: 10.7 g/dL — ABNORMAL LOW (ref 12.0–16.0)
LYMPHS ABS: 2.5 10*3/uL (ref 1.0–3.6)
LYMPHS PCT: 29 %
MCH: 30.4 pg (ref 26.0–34.0)
MCHC: 32.9 g/dL (ref 32.0–36.0)
MCV: 92.4 fL (ref 80.0–100.0)
MONO ABS: 0.5 10*3/uL (ref 0.2–0.9)
Monocytes Relative: 6 %
Neutro Abs: 4.9 10*3/uL (ref 1.4–6.5)
Neutrophils Relative %: 57 %
PLATELETS: 705 10*3/uL — AB (ref 150–440)
RBC: 3.54 MIL/uL — ABNORMAL LOW (ref 3.80–5.20)
RDW: 22 % — AB (ref 11.5–14.5)
WBC: 8.5 10*3/uL (ref 3.6–11.0)

## 2017-03-23 IMAGING — CT CT HEAD W/O CM
2 of 3 series · 16 of 30 positions shown, 20 images · non-contrast
Comparison: Head CT dated 07/29/2015

CLINICAL DATA: 69-year-old female with confusion.

EXAM:
CT HEAD WITHOUT CONTRAST
TECHNIQUE: Contiguous axial images were obtained from the base of the skull
through the vertex without intravenous contrast.

[Series 2: head wo · axial · 0.41mm/px · z∈[-98,+22]mm · 13 of 32 slices shown, 17 images]
[im 3/32  brain]
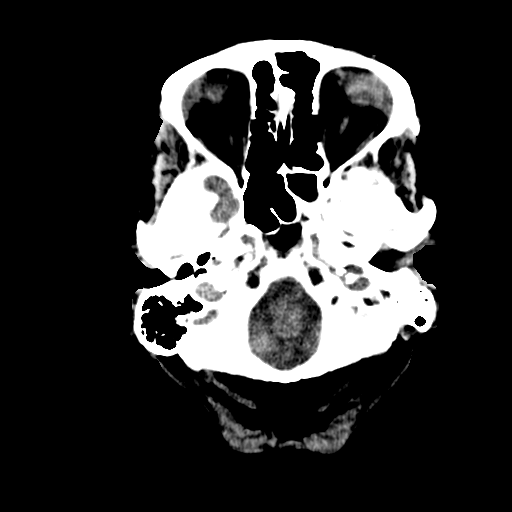
[im 3/32  bone]
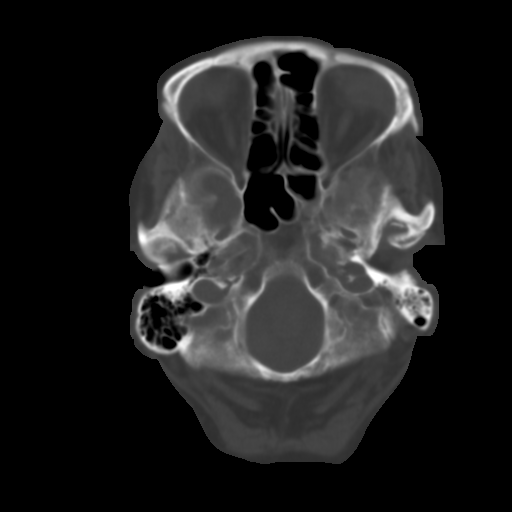
[im 5/32  brain]
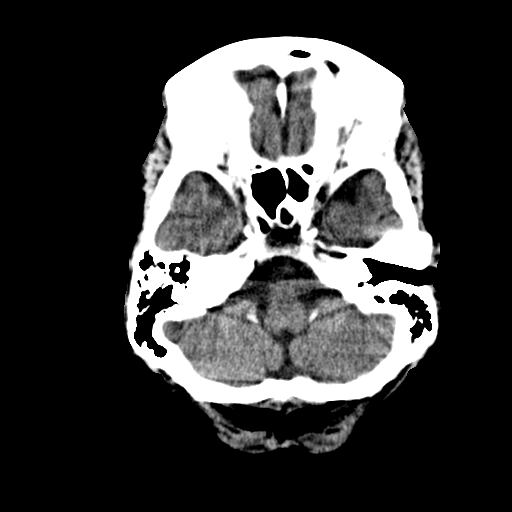
[im 7/32  brain]
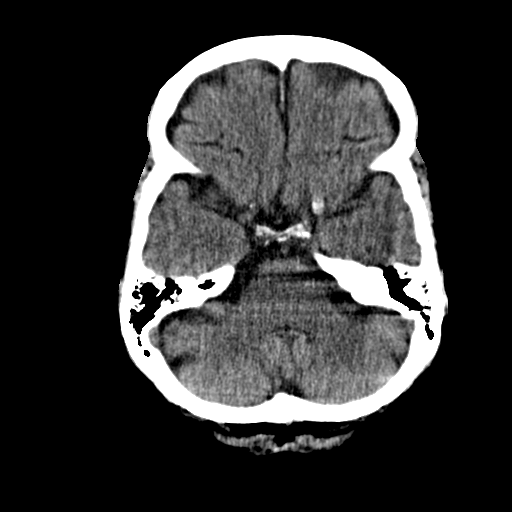
[im 9/32  brain]
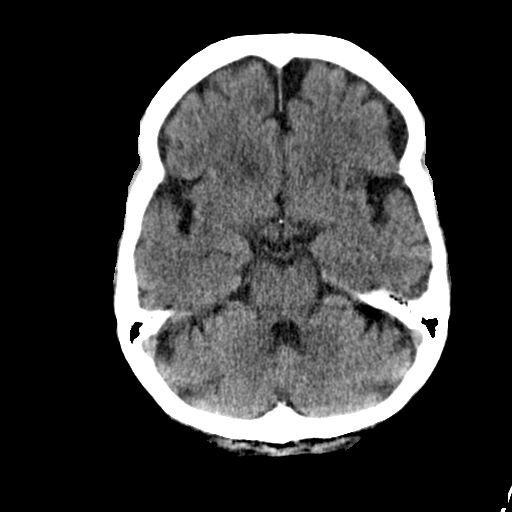
[im 12/32  brain]
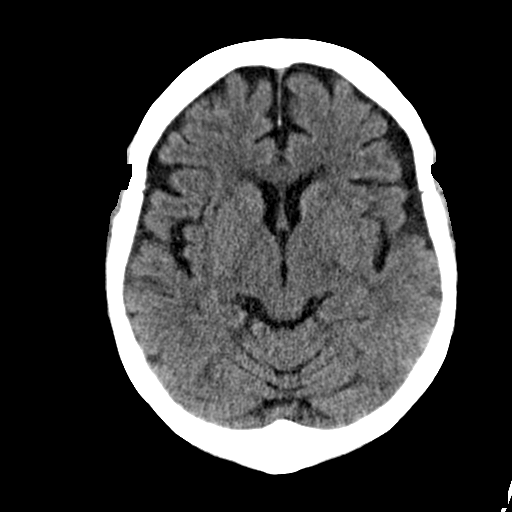
[im 12/32  bone]
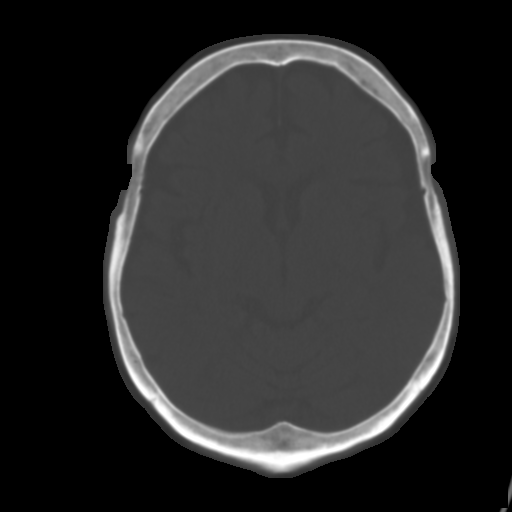
[im 14/32  brain]
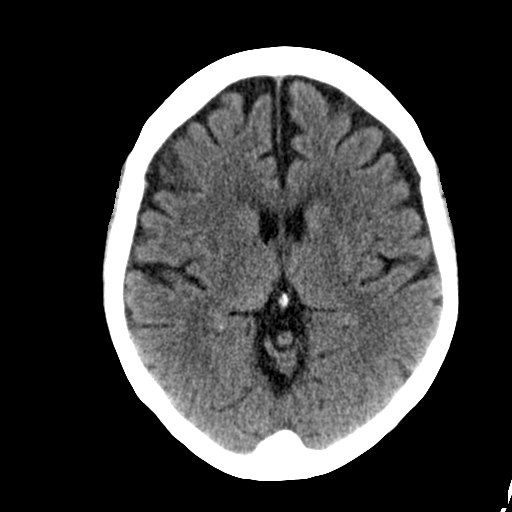
[im 16/32  brain]
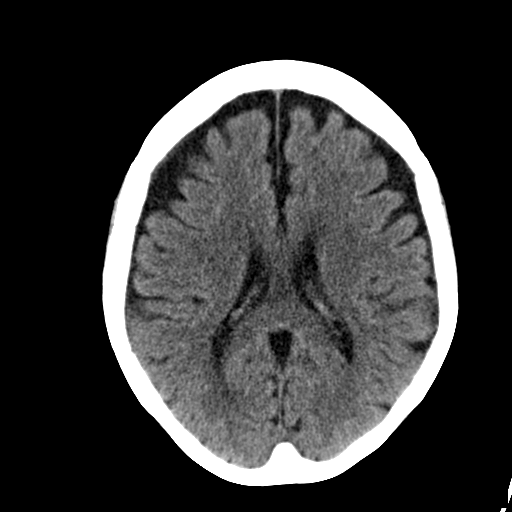
[im 18/32  brain]
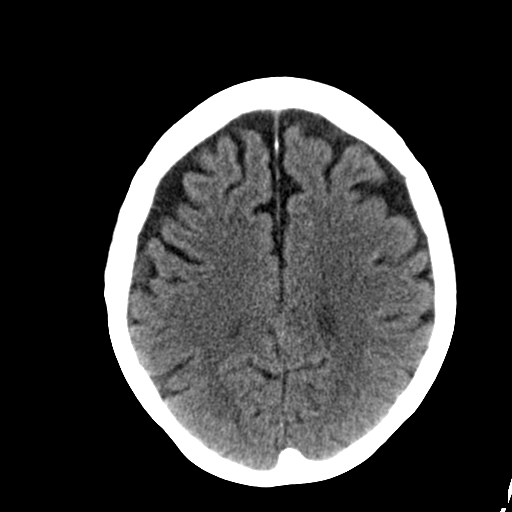
[im 20/32  brain]
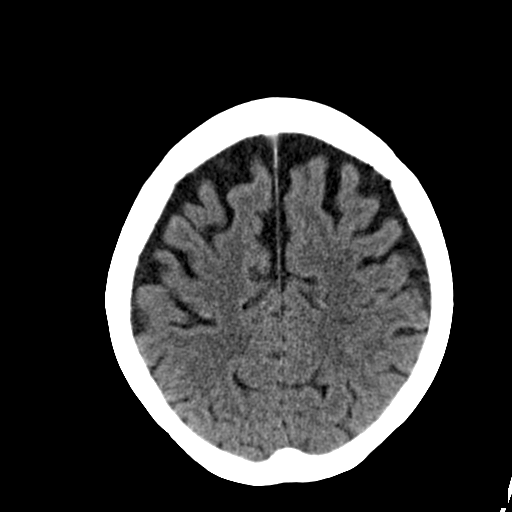
[im 20/32  bone]
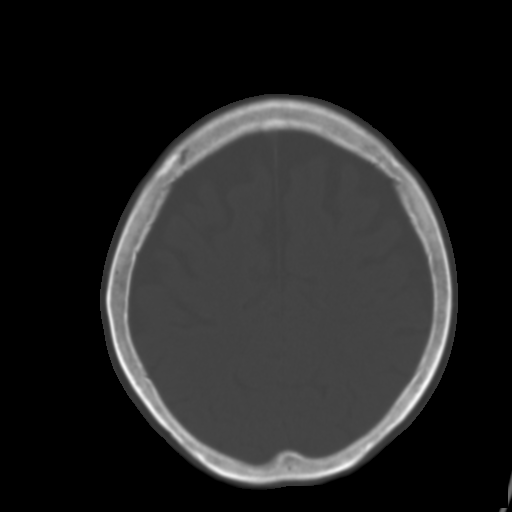
[im 23/32  brain]
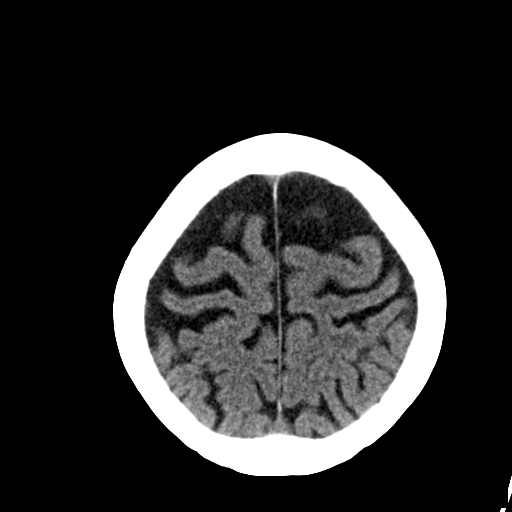
[im 25/32  brain]
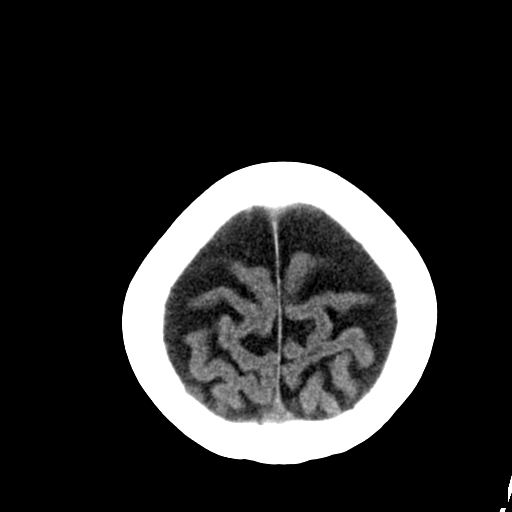
[im 27/32  brain]
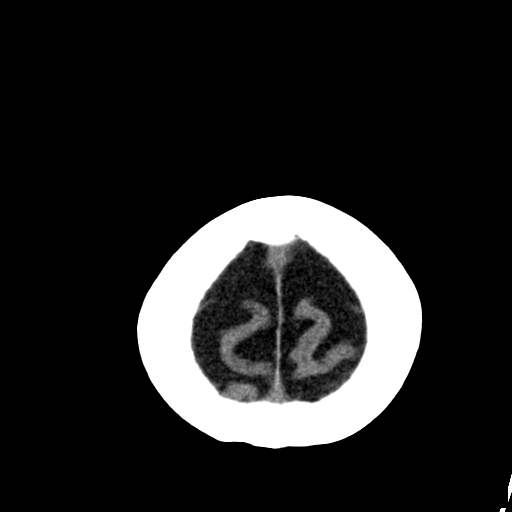
[im 29/32  brain]
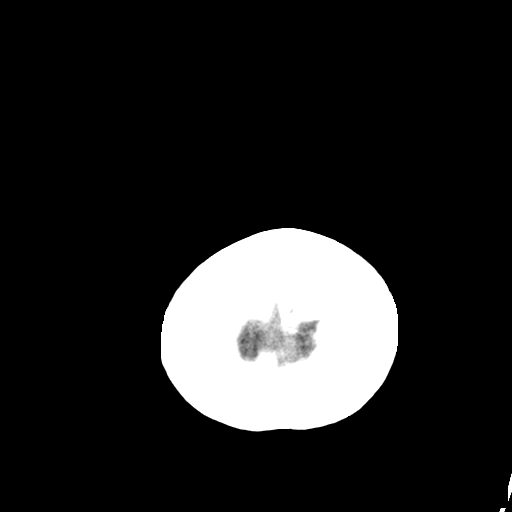
[im 29/32  bone]
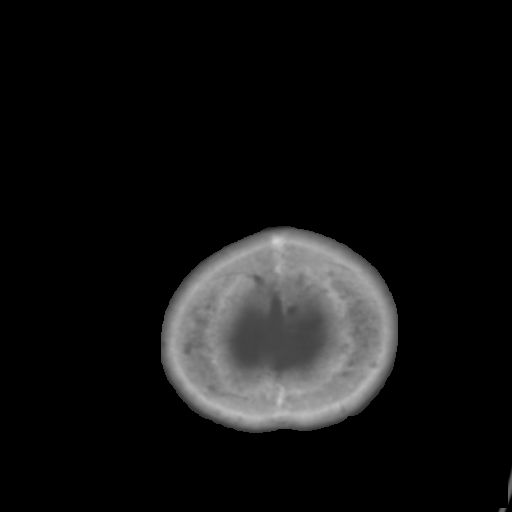

[Series 5: head bone · axial · 0.41mm/px · z∈[-97,-91]mm · 3 of 24 slices shown]
[im 3/24  bone]
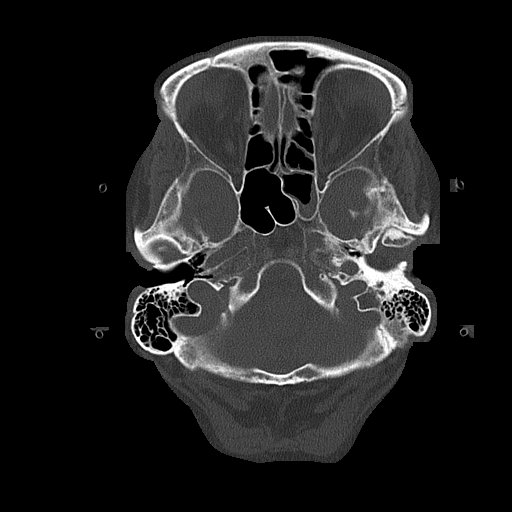
[im 5/24  bone]
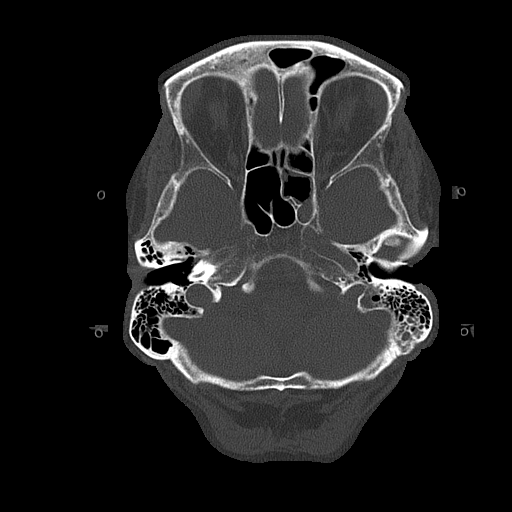
[im 7/24  bone]
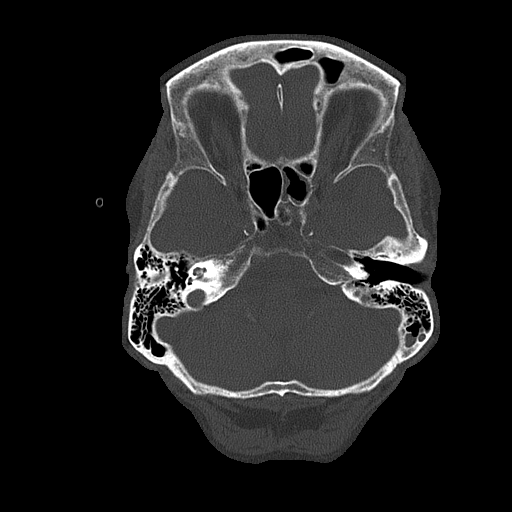

[16 of 30 positions shown; findings below may reference images not displayed]

FINDINGS: There is stable diffuse global atrophy as well as moderate chronic
microvascular ischemic disease. There is no intracranial hemorrhage.
No mass effect or midline shift identified.

The visualized paranasal sinuses and mastoid air cells are well
aerated. The calvarium is intact.
IMPRESSION: No acute intracranial hemorrhage.

Diffuse brain atrophy and chronic microvascular ischemic disease.

If symptoms persist and there are no contraindications, MRI may
provide better evaluation if clinically indicated.

## 2017-04-07 DIAGNOSIS — S51001A Unspecified open wound of right elbow, initial encounter: Secondary | ICD-10-CM | POA: Insufficient documentation

## 2017-04-14 NOTE — Pre-Procedure Instructions (Signed)
Spoke with daughter re meds to take am surgery

## 2017-04-16 ENCOUNTER — Ambulatory Visit: Payer: Medicare Other | Admitting: Anesthesiology

## 2017-04-16 ENCOUNTER — Encounter
Admission: RE | Disposition: A | Discharge status: Home / Self Care | Payer: Self-pay | Source: Ambulatory Visit | Attending: Orthopedic Surgery

## 2017-04-16 ENCOUNTER — Ambulatory Visit
Admission: RE | Admit: 2017-04-16 | Discharge: 2017-04-16 | Disposition: A | Discharge status: Home / Self Care | Payer: Medicare Other | Source: Ambulatory Visit | Attending: Orthopedic Surgery | Admitting: Orthopedic Surgery

## 2017-04-16 ENCOUNTER — Encounter: Payer: Self-pay | Admitting: Orthopedic Surgery

## 2017-04-16 DIAGNOSIS — Z8261 Family history of arthritis: Secondary | ICD-10-CM | POA: Insufficient documentation

## 2017-04-16 DIAGNOSIS — Z7982 Long term (current) use of aspirin: Secondary | ICD-10-CM | POA: Insufficient documentation

## 2017-04-16 DIAGNOSIS — F419 Anxiety disorder, unspecified: Secondary | ICD-10-CM | POA: Diagnosis not present

## 2017-04-16 DIAGNOSIS — E119 Type 2 diabetes mellitus without complications: Secondary | ICD-10-CM | POA: Insufficient documentation

## 2017-04-16 DIAGNOSIS — S51001D Unspecified open wound of right elbow, subsequent encounter: Secondary | ICD-10-CM | POA: Diagnosis not present

## 2017-04-16 DIAGNOSIS — F329 Major depressive disorder, single episode, unspecified: Secondary | ICD-10-CM | POA: Insufficient documentation

## 2017-04-16 DIAGNOSIS — I10 Essential (primary) hypertension: Secondary | ICD-10-CM | POA: Diagnosis not present

## 2017-04-16 DIAGNOSIS — Z9109 Other allergy status, other than to drugs and biological substances: Secondary | ICD-10-CM | POA: Diagnosis not present

## 2017-04-16 DIAGNOSIS — M069 Rheumatoid arthritis, unspecified: Secondary | ICD-10-CM | POA: Insufficient documentation

## 2017-04-16 DIAGNOSIS — J449 Chronic obstructive pulmonary disease, unspecified: Secondary | ICD-10-CM | POA: Diagnosis not present

## 2017-04-16 DIAGNOSIS — Z87891 Personal history of nicotine dependence: Secondary | ICD-10-CM | POA: Diagnosis not present

## 2017-04-16 DIAGNOSIS — T84192A Other mechanical complication of internal fixation device of bone of right forearm, initial encounter: Secondary | ICD-10-CM | POA: Diagnosis present

## 2017-04-16 DIAGNOSIS — K219 Gastro-esophageal reflux disease without esophagitis: Secondary | ICD-10-CM | POA: Diagnosis not present

## 2017-04-16 DIAGNOSIS — Z803 Family history of malignant neoplasm of breast: Secondary | ICD-10-CM | POA: Diagnosis not present

## 2017-04-16 DIAGNOSIS — Z888 Allergy status to other drugs, medicaments and biological substances status: Secondary | ICD-10-CM | POA: Diagnosis not present

## 2017-04-16 DIAGNOSIS — Z79899 Other long term (current) drug therapy: Secondary | ICD-10-CM | POA: Diagnosis not present

## 2017-04-16 DIAGNOSIS — Z811 Family history of alcohol abuse and dependence: Secondary | ICD-10-CM | POA: Diagnosis not present

## 2017-04-16 DIAGNOSIS — W19XXXD Unspecified fall, subsequent encounter: Secondary | ICD-10-CM | POA: Diagnosis not present

## 2017-04-16 DIAGNOSIS — Z8249 Family history of ischemic heart disease and other diseases of the circulatory system: Secondary | ICD-10-CM | POA: Diagnosis not present

## 2017-04-16 DIAGNOSIS — G609 Hereditary and idiopathic neuropathy, unspecified: Secondary | ICD-10-CM | POA: Diagnosis not present

## 2017-04-16 DIAGNOSIS — M1990 Unspecified osteoarthritis, unspecified site: Secondary | ICD-10-CM | POA: Diagnosis not present

## 2017-04-16 DIAGNOSIS — Z885 Allergy status to narcotic agent status: Secondary | ICD-10-CM | POA: Insufficient documentation

## 2017-04-16 DIAGNOSIS — Z7901 Long term (current) use of anticoagulants: Secondary | ICD-10-CM | POA: Diagnosis not present

## 2017-04-16 HISTORY — PX: HARDWARE REMOVAL: SHX979

## 2017-04-16 HISTORY — PX: INCISION AND DRAINAGE OF WOUND: SHX1803

## 2017-04-16 LAB — URINALYSIS, COMPLETE (UACMP) WITH MICROSCOPIC
BACTERIA UA: NONE SEEN
Bilirubin Urine: NEGATIVE
GLUCOSE, UA: NEGATIVE mg/dL
Hgb urine dipstick: NEGATIVE
Ketones, ur: NEGATIVE mg/dL
Leukocytes, UA: NEGATIVE
Nitrite: NEGATIVE
PH: 5 (ref 5.0–8.0)
Protein, ur: 30 mg/dL — AB
Specific Gravity, Urine: 1.03 (ref 1.005–1.030)

## 2017-04-16 LAB — CBC WITH DIFFERENTIAL/PLATELET
BASOS ABS: 0.2 10*3/uL — AB (ref 0–0.1)
Basophils Relative: 1 %
EOS PCT: 3 %
Eosinophils Absolute: 0.4 10*3/uL (ref 0–0.7)
HEMATOCRIT: 38.1 % (ref 35.0–47.0)
Hemoglobin: 12.9 g/dL (ref 12.0–16.0)
LYMPHS ABS: 2.4 10*3/uL (ref 1.0–3.6)
LYMPHS PCT: 16 %
MCH: 30.4 pg (ref 26.0–34.0)
MCHC: 33.9 g/dL (ref 32.0–36.0)
MCV: 89.7 fL (ref 80.0–100.0)
MONO ABS: 1.3 10*3/uL — AB (ref 0.2–0.9)
Monocytes Relative: 8 %
NEUTROS ABS: 11 10*3/uL — AB (ref 1.4–6.5)
Neutrophils Relative %: 72 %
PLATELETS: 769 10*3/uL — AB (ref 150–440)
RBC: 4.24 MIL/uL (ref 3.80–5.20)
RDW: 19.2 % — AB (ref 11.5–14.5)
WBC: 15.2 10*3/uL — ABNORMAL HIGH (ref 3.6–11.0)

## 2017-04-16 LAB — BASIC METABOLIC PANEL
Anion gap: 10 (ref 5–15)
BUN: 20 mg/dL (ref 6–20)
CHLORIDE: 103 mmol/L (ref 101–111)
CO2: 24 mmol/L (ref 22–32)
Calcium: 9 mg/dL (ref 8.9–10.3)
Creatinine, Ser: 0.85 mg/dL (ref 0.44–1.00)
GFR calc Af Amer: >60 mL/min (ref >60)
GFR calc non Af Amer: >60 mL/min (ref >60)
GLUCOSE: 173 mg/dL — AB (ref 65–99)
POTASSIUM: 3.7 mmol/L (ref 3.5–5.1)
Sodium: 137 mmol/L (ref 135–145)

## 2017-04-16 SURGERY — REMOVAL, HARDWARE
Anesthesia: General | Site: Elbow | Laterality: Right | Wound class: Clean

## 2017-04-16 MED ORDER — LIDOCAINE HCL (CARDIAC) 20 MG/ML IV SOLN
INTRAVENOUS | Status: DC | PRN
  Administered 2017-04-16: 60 mg via INTRAVENOUS

## 2017-04-16 MED ORDER — FENTANYL CITRATE (PF) 100 MCG/2ML IJ SOLN
25.0000 ug | INTRAMUSCULAR | Status: DC | PRN
  Administered 2017-04-16 (×4): 25 ug via INTRAVENOUS

## 2017-04-16 MED ORDER — FENTANYL CITRATE (PF) 100 MCG/2ML IJ SOLN
INTRAMUSCULAR | Status: DC | PRN
  Administered 2017-04-16 (×4): 50 ug via INTRAVENOUS

## 2017-04-16 MED ORDER — ROCURONIUM BROMIDE 50 MG/5ML IV SOLN
INTRAVENOUS | Status: AC
  Filled 2017-04-16: qty 1

## 2017-04-16 MED ORDER — LIDOCAINE HCL (PF) 2 % IJ SOLN
INTRAMUSCULAR | Status: AC
  Filled 2017-04-16: qty 2

## 2017-04-16 MED ORDER — SUCCINYLCHOLINE CHLORIDE 20 MG/ML IJ SOLN
INTRAMUSCULAR | Status: DC | PRN
  Administered 2017-04-16: 100 mg via INTRAVENOUS

## 2017-04-16 MED ORDER — GENTAMICIN SULFATE 40 MG/ML IJ SOLN
INTRAMUSCULAR | Status: AC
  Filled 2017-04-16: qty 2

## 2017-04-16 MED ORDER — PROPOFOL 10 MG/ML IV BOLUS
INTRAVENOUS | Status: AC
  Filled 2017-04-16: qty 20

## 2017-04-16 MED ORDER — CEFAZOLIN SODIUM-DEXTROSE 2-4 GM/100ML-% IV SOLN
INTRAVENOUS | Status: AC
  Filled 2017-04-16: qty 100

## 2017-04-16 MED ORDER — SUGAMMADEX SODIUM 200 MG/2ML IV SOLN
INTRAVENOUS | Status: DC | PRN
  Administered 2017-04-16: 120 mg via INTRAVENOUS

## 2017-04-16 MED ORDER — SUCCINYLCHOLINE CHLORIDE 20 MG/ML IJ SOLN
INTRAMUSCULAR | Status: AC
  Filled 2017-04-16: qty 1

## 2017-04-16 MED ORDER — ONDANSETRON HCL 4 MG/2ML IJ SOLN
INTRAMUSCULAR | Status: AC
  Filled 2017-04-16: qty 2

## 2017-04-16 MED ORDER — LIDOCAINE-EPINEPHRINE 1 %-1:100000 IJ SOLN
INTRAMUSCULAR | Status: AC
  Filled 2017-04-16: qty 1

## 2017-04-16 MED ORDER — ONDANSETRON HCL 4 MG/2ML IJ SOLN
INTRAMUSCULAR | Status: DC | PRN
  Administered 2017-04-16: 4 mg via INTRAVENOUS

## 2017-04-16 MED ORDER — CHLORHEXIDINE GLUCONATE 4 % EX LIQD: 60.0000 mL | Freq: Once | CUTANEOUS | Status: DC

## 2017-04-16 MED ORDER — LIDOCAINE-EPINEPHRINE 1 %-1:100000 IJ SOLN
INTRAMUSCULAR | Status: DC | PRN
  Administered 2017-04-16: 10 mL

## 2017-04-16 MED ORDER — FENTANYL CITRATE (PF) 100 MCG/2ML IJ SOLN
INTRAMUSCULAR | Status: AC
  Filled 2017-04-16: qty 2

## 2017-04-16 MED ORDER — DEXAMETHASONE SODIUM PHOSPHATE 10 MG/ML IJ SOLN
INTRAMUSCULAR | Status: DC | PRN
  Administered 2017-04-16: 5 mg via INTRAVENOUS

## 2017-04-16 MED ORDER — ROCURONIUM BROMIDE 100 MG/10ML IV SOLN
INTRAVENOUS | Status: DC | PRN
  Administered 2017-04-16: 20 mg via INTRAVENOUS
  Administered 2017-04-16 (×2): 10 mg via INTRAVENOUS

## 2017-04-16 MED ORDER — DEXAMETHASONE SODIUM PHOSPHATE 10 MG/ML IJ SOLN
INTRAMUSCULAR | Status: AC
  Filled 2017-04-16: qty 1

## 2017-04-16 MED ORDER — FENTANYL CITRATE (PF) 100 MCG/2ML IJ SOLN
INTRAMUSCULAR | Status: AC
  Administered 2017-04-16: 25 ug via INTRAVENOUS
  Filled 2017-04-16: qty 2

## 2017-04-16 MED ORDER — ONDANSETRON HCL 4 MG/2ML IJ SOLN: 4.0000 mg | Freq: Once | INTRAMUSCULAR | Status: DC | PRN

## 2017-04-16 MED ORDER — HYDROCODONE-ACETAMINOPHEN 5-325 MG PO TABS
ORAL_TABLET | ORAL | Status: AC
  Filled 2017-04-16: qty 1

## 2017-04-16 MED ORDER — SODIUM CHLORIDE 0.9 % IR SOLN
Status: DC | PRN
  Administered 2017-04-16: 500 mL

## 2017-04-16 MED ORDER — CEFAZOLIN SODIUM-DEXTROSE 2-4 GM/100ML-% IV SOLN
2.0000 g | INTRAVENOUS | Status: AC
  Administered 2017-04-16: 2 g via INTRAVENOUS

## 2017-04-16 MED ORDER — LACTATED RINGERS IV SOLN
INTRAVENOUS | Status: DC
  Administered 2017-04-16 (×3): via INTRAVENOUS

## 2017-04-16 MED ORDER — HYDROCODONE-ACETAMINOPHEN 5-325 MG PO TABS: 1.0000 | ORAL_TABLET | ORAL | 0 refills | Status: DC | PRN

## 2017-04-16 MED ORDER — PROPOFOL 10 MG/ML IV BOLUS
INTRAVENOUS | Status: DC | PRN
  Administered 2017-04-16: 100 mg via INTRAVENOUS

## 2017-04-16 MED ORDER — HYDROCODONE-ACETAMINOPHEN 5-325 MG PO TABS
1.0000 | ORAL_TABLET | ORAL | Status: DC | PRN
  Administered 2017-04-16: 1 via ORAL

## 2017-04-16 SURGICAL SUPPLY — 57 items
BANDAGE ACE 4X5 VEL STRL LF (GAUZE/BANDAGES/DRESSINGS) ×4 IMPLANT
BNDG COHESIVE 4X5 TAN STRL (GAUZE/BANDAGES/DRESSINGS) ×4 IMPLANT
BNDG ESMARK 6X12 TAN STRL LF (GAUZE/BANDAGES/DRESSINGS) IMPLANT
BNDG GAUZE 4.5X4.1 6PLY STRL (MISCELLANEOUS) ×8 IMPLANT
CANISTER SUCT 1200ML W/VALVE (MISCELLANEOUS) ×4 IMPLANT
CNTNR SPEC 2.5X3XGRAD LEK (MISCELLANEOUS) ×2
CONT SPEC 4OZ STER OR WHT (MISCELLANEOUS) ×2
CONTAINER SPEC 2.5X3XGRAD LEK (MISCELLANEOUS) ×2 IMPLANT
CUFF TOURN 24 STER (MISCELLANEOUS) IMPLANT
CUFF TOURN 30 STER DUAL PORT (MISCELLANEOUS) IMPLANT
CUFF TOURN DUAL PL 12 NO SLV (MISCELLANEOUS) ×4 IMPLANT
DERMABOND ADVANCED (GAUZE/BANDAGES/DRESSINGS) ×4
DERMABOND ADVANCED .7 DNX12 (GAUZE/BANDAGES/DRESSINGS) ×4 IMPLANT
DRAPE FLUOR MINI C-ARM 54X84 (DRAPES) ×4 IMPLANT
DRAPE INCISE IOBAN 66X45 STRL (DRAPES) ×4 IMPLANT
DRAPE U-SHAPE 47X51 STRL (DRAPES) ×4 IMPLANT
DRSG DERMACEA 8X12 NADH (GAUZE/BANDAGES/DRESSINGS) ×4 IMPLANT
DRSG EMULSION OIL 3X8 NADH (GAUZE/BANDAGES/DRESSINGS) ×4 IMPLANT
DRSG RESTORE 4 X5 SIL TRIA (OSTOMY) IMPLANT
DURAPREP 26ML APPLICATOR (WOUND CARE) ×4 IMPLANT
ELECT CAUTERY BLADE 6.4 (BLADE) ×4 IMPLANT
ELECT REM PT RETURN 9FT ADLT (ELECTROSURGICAL) ×4
ELECTRODE REM PT RTRN 9FT ADLT (ELECTROSURGICAL) ×2 IMPLANT
GAUZE SPONGE 4X4 12PLY STRL (GAUZE/BANDAGES/DRESSINGS) ×4 IMPLANT
GLOVE BIO SURGEON STRL SZ 6.5 (GLOVE) ×15 IMPLANT
GLOVE BIO SURGEONS STRL SZ 6.5 (GLOVE) ×5
GLOVE BIOGEL M STRL SZ7.5 (GLOVE) ×4 IMPLANT
GOWN STRL REUS W/ TWL LRG LVL3 (GOWN DISPOSABLE) ×6 IMPLANT
GOWN STRL REUS W/TWL LRG LVL3 (GOWN DISPOSABLE) ×6
KIT RM TURNOVER STRD PROC AR (KITS) ×4 IMPLANT
LABEL OR SOLS (LABEL) ×4 IMPLANT
MONOCRYL 5-0 ×4 IMPLANT
MONOCRYL PLUS ×3 IMPLANT
NDL SAFETY ECLIPSE 18X1.5 (NEEDLE) ×2 IMPLANT
NEEDLE HYPO 18GX1.5 SHARP (NEEDLE) ×2
NS IRRIG 1000ML POUR BTL (IV SOLUTION) ×4 IMPLANT
NS IRRIG 500ML POUR BTL (IV SOLUTION) ×4 IMPLANT
PACK EXTREMITY ARMC (MISCELLANEOUS) ×4 IMPLANT
PAD ABD DERMACEA PRESS 5X9 (GAUZE/BANDAGES/DRESSINGS) ×12 IMPLANT
PAD CAST CTTN 4X4 STRL (SOFTGOODS) IMPLANT
PADDING CAST COTTON 4X4 STRL (SOFTGOODS)
SOL PREP PVP 2OZ (MISCELLANEOUS) ×4
SOLUTION PREP PVP 2OZ (MISCELLANEOUS) ×2 IMPLANT
SPONGE LAP 18X18 5 PK (GAUZE/BANDAGES/DRESSINGS) ×4 IMPLANT
STAPLER SKIN PROX 35W (STAPLE) ×4 IMPLANT
STOCKINETTE IMPERVIOUS 9X36 MD (GAUZE/BANDAGES/DRESSINGS) ×4 IMPLANT
STOCKINETTE STRL 6IN 960660 (GAUZE/BANDAGES/DRESSINGS) ×4 IMPLANT
SUT MNCRL AB 4-0 PS2 18 (SUTURE) ×4 IMPLANT
SUT MNCRL+ 5-0 UNDYED PC-3 (SUTURE) ×2 IMPLANT
SUT MONOCRYL 5-0 (SUTURE) ×2
SUT VIC AB 0 CT1 36 (SUTURE) ×8 IMPLANT
SUT VIC AB 2-0 SH 27 (SUTURE) ×2
SUT VIC AB 2-0 SH 27XBRD (SUTURE) ×2 IMPLANT
SUTURE VIC 5-0 (SUTURE) ×8 IMPLANT
SYR 3ML LL SCALE MARK (SYRINGE) ×4 IMPLANT
TRAY PREP VAG/GEN (MISCELLANEOUS) ×4 IMPLANT
VICRYL 5.0 ×8 IMPLANT

## 2017-04-16 NOTE — Op Note (Signed)
OPERATIVE NOTE  DATE OF SURGERY:  04/16/2017  PATIENT NAME:  Michelle Cabrera   DOB: Jul 26, 1946  MRN: [id number]   PRE-OPERATIVE DIAGNOSIS:  Ulceration and exposure of orthopedic hardware, status post open reduction and internal fixation of a right olecranon fracture  POST-OPERATIVE DIAGNOSIS:  Same  PROCEDURE:  Removal of orthopedic hardware (olecranon plate, one washer, and 7 screws)  SURGEON:  Jena Gauss., M.D.   ANESTHESIA: general  ESTIMATED BLOOD LOSS: Minimal  FLUIDS REPLACED: 1000 mL of crystalloid  TOURNIQUET TIME: Not used   INDICATIONS FOR SURGERY: Michelle Cabrera is a 71 y.o. year old female who previously underwent open reduction and internal fixation of a right olecranon fracture approximately 2 months ago. The patient developed decubitus ulceration over the olecranon with exposure of the orthopedic hardware. In consultation with Dr.Claire Dillingham, Plastic Surgery, it was elected to remove the hardware and attempt closure of the wound.. After discussion of the risks and benefits of surgical intervention, the patient expressed understanding of the risks benefits and agree with plans for hardware removal with subsequent closure of the wound as per Dr Ulice Bold.   PROCEDURE IN DETAIL: The patient was brought into the operating room and, after adequate general endotracheal anesthesia was achieved, the patient was placed in a left lateral decubitus position. Beanbag was utilized and all bony prominences were well padded and axillary roll was placed. A tourniquet was placed on the patient's upper right arm but was not inflated. The patient's right elbow and arm were cleaned and prepped with Betadine and draped in the usual sterile fashion. A "timeout" was performed as per usual protocol. The ulceration over the area olecranon measuring approximately 3 cm x 3 cm. The 3 proximal screws were removed without difficulty. Next, the distal portion of the previous surgical  incision was incised and the distal 4 screws and washer were removed without difficulty. The plate was then removed. Wounds were irrigated with copious amounts of normal saline with MRI solution.  Dr. Ulice Bold then addressed wound closure and placement of wound VAC. That portion of the operative procedure will be dictated by Dr. Ulice Bold.  The patient tolerated the procedure well. She was transported to the recovery room in stable condition.  Haadi Santellan P. Angie Fava M.D.

## 2017-04-16 NOTE — H&P (Signed)
The patient has been re-examined, and the chart reviewed, and there have been no interval changes to the documented history and physical.    The risks, benefits, and alternatives have been discussed at length. The patient expressed understanding of the risks benefits and agreed with plans for surgical intervention.  James P. Hooten, Jr. M.D.    

## 2017-04-16 NOTE — OR Nursing (Signed)
Dr. Ulice Bold used Cytal Wound Matrix 10 x 15 cm Lot #[id number], and MicroMatrix 500mg  Lot # . Doctor did not want Patient charge for supplies. Dr. [id number] brought supplies with her to OR  Dr. Ulice Bold removed 7 screws and on plate from patient's right elbow

## 2017-04-16 NOTE — Op Note (Signed)
DATE OF OPERATION: 04/16/2017  LOCATION: [facility] Main Operating Room Outpatient  PREOPERATIVE DIAGNOSIS: right elbow wound  POSTOPERATIVE DIAGNOSIS: Same  PROCEDURE: Preparation of right elbow wound 5 x 5 cm for placement of Acell (500 mg powder and 5 x 5 cm sheet) and placement of VAC.  Closure of the 4 cm incision on the forearm.  SURGEON: Claire Sanger Dillingham, DO  ASSISTANT:  Dr. Ernest Pine, MD  EBL: 5 cc  CONDITION: Stable  COMPLICATIONS: None  INDICATION: The patient, Michelle Cabrera, is a 71 y.o. female born on 1945/12/03, is here for treatment of a right elbow wound after fracture repair and hardware exposure.   PROCEDURE DETAILS:  The patient was seen prior to surgery and marked.  The IV antibiotics were given. The patient was taken to the operating room and given a general anesthetic. A standard time out was performed and all information was confirmed by those in the room. SCDs were placed.   The right arm was prepped and draped by orthopedic surgery. The hardware was removed by the Dr. Ernest Pine.  Once the hardware was out the patient was rendered to the plastic surgery team.    The wound was irrigated with antibiotic solution and saline.  Hemostasis was achieved with a lidocaine with epinepherine compress.  All of the powder and sheet of the Acell was applied and secured in place with the 5-0 Vicryl over the elbow wound of 5 x 5 cm.  The 5-0 and 6-0 Monocryl was used to close the forearm incision with vertical mattress and simple interrupted sutures for the 4 cm incision where the hardware screws were removed.  The skin was so thin a 5 cm skin tear was noted on the radial forearm and this was closed with dermabond and 6-0 Monocryl sutures.  The adaptic was applied over the elbow and the [facility] secured with an excellent seal.  The arm was wrapped with ABD, Kerlex and an Ace wrap.  The patient tolerated the procedure well. The patient was allowed to wake up and taken to recovery  room in stable condition at the end of the case. The family was notified at the end of the case.

## 2017-04-16 NOTE — Anesthesia Preprocedure Evaluation (Signed)
Anesthesia Evaluation  Patient identified by MRN, date of birth, ID band Patient awake    Reviewed: Allergy & Precautions, NPO status , Patient's Chart, lab work & pertinent test results, reviewed documented beta blocker date and time   Airway Mallampati: II  TM Distance: >3 FB     Dental  (+) Chipped   Pulmonary COPD, former smoker,           Cardiovascular hypertension, Pt. on medications + angina + Peripheral Vascular Disease       Neuro/Psych  Headaches, PSYCHIATRIC DISORDERS Anxiety Depression Schizophrenia  Neuromuscular disease    GI/Hepatic GERD  Controlled,  Endo/Other  diabetes, Type 2  Renal/GU      Musculoskeletal  (+) Arthritis , Rheumatoid disorders,    Abdominal   Peds  Hematology   Anesthesia Other Findings Allergic to dilaudid.  Reproductive/Obstetrics                             Anesthesia Physical Anesthesia Plan  ASA: III  Anesthesia Plan: General   Post-op Pain Management:    Induction: Intravenous  PONV Risk Score and Plan:   Airway Management Planned: LMA  Additional Equipment:   Intra-op Plan:   Post-operative Plan:   Informed Consent: I have reviewed the patients History and Physical, chart, labs and discussed the procedure including the risks, benefits and alternatives for the proposed anesthesia with the patient or authorized representative who has indicated his/her understanding and acceptance.     Plan Discussed with: CRNA  Anesthesia Plan Comments:         Anesthesia Quick Evaluation

## 2017-04-16 NOTE — Anesthesia Procedure Notes (Signed)
Procedure Name: Intubation Date/Time: 04/16/2017 5:05 PM Performed by: Nelda Marseille Pre-anesthesia Checklist: Patient identified, Patient being monitored, Timeout performed, Emergency Drugs available and Suction available Patient Re-evaluated:Patient Re-evaluated prior to induction Oxygen Delivery Method: Circle system utilized Preoxygenation: Pre-oxygenation with 100% oxygen Induction Type: IV induction Ventilation: Mask ventilation without difficulty Laryngoscope Size: Mac and 3 Grade View: Grade I Tube type: Oral Tube size: 7.0 mm Number of attempts: 1 Airway Equipment and Method: Stylet and Video-laryngoscopy Placement Confirmation: ETT inserted through vocal cords under direct vision,  positive ETCO2 and breath sounds checked- equal and bilateral Secured at: 21 cm Tube secured with: Tape Dental Injury: Teeth and Oropharynx as per pre-operative assessment  Difficulty Due To: Difficulty was anticipated and Difficult Airway- due to anterior larynx

## 2017-04-16 NOTE — Transfer of Care (Signed)
Immediate Anesthesia Transfer of Care Note  Patient: Michelle Cabrera  Procedure(s) Performed: Procedure(s): HARDWARE REMOVAL (Right) IRRIGATION AND DEBRIDEMENT WOUND (Right)  Patient Location: PACU  Anesthesia Type:General  Level of Consciousness: awake, alert , oriented and patient cooperative  Airway & Oxygen Therapy: Patient Spontanous Breathing and Patient connected to face mask oxygen  Post-op Assessment: Report given to RN and Post -op Vital signs reviewed and stable  Post vital signs: Reviewed and stable  Last Vitals:  Vitals:   04/16/17 1352 04/16/17 1849  BP: (!) 163/81 (!) 150/65  Pulse: (!) 117 95  Resp: 16 11  Temp: 36.5 C 36.7 C  SpO2: 97% 100%    Last Pain:  Vitals:   04/16/17 1352  TempSrc: Tympanic  PainSc: 9          Complications: No apparent anesthesia complications

## 2017-04-16 NOTE — Anesthesia Post-op Follow-up Note (Signed)
Anesthesia QCDR form completed.        

## 2017-04-16 NOTE — Anesthesia Postprocedure Evaluation (Signed)
Anesthesia Post Note  Patient: Michelle Cabrera  Procedure(s) Performed: Procedure(s) (LRB): HARDWARE REMOVAL (Right) IRRIGATION AND DEBRIDEMENT WOUND (Right)  Patient location during evaluation: PACU Anesthesia Type: General Level of consciousness: awake and alert Pain management: pain level controlled Vital Signs Assessment: post-procedure vital signs reviewed and stable Respiratory status: spontaneous breathing, nonlabored ventilation, respiratory function stable and patient connected to nasal cannula oxygen Cardiovascular status: blood pressure returned to baseline and stable Postop Assessment: no apparent nausea or vomiting Anesthetic complications: no     Last Vitals:  Vitals:   04/16/17 1949 04/16/17 2019  BP: 113/80 137/75  Pulse: 93 97  Resp: 14 18  Temp: 36.8 C   SpO2: 97% 97%    Last Pain:  Vitals:   04/16/17 2019  TempSrc:   PainSc: 3                  Lenard Simmer

## 2017-04-16 NOTE — Discharge Instructions (Signed)

## 2017-04-16 NOTE — H&P (Signed)
Michelle Cabrera is an 71 y.o. female.   Chief Complaint: right elbow wound HPI: The patient is a 71 yrs old wf here for treatment of a right elbow wound.  She had a fall with a resulting fracture 2 months ago.  She underwent hardware stabilization and then a wound at the right elbow with the hardware exposed.  The vac has been used and the wound is ~ 3 x 3 cm in size.  The bone is exposed but the surrounding tissue does not look infected although thin.    Past Medical History:  Diagnosis Date  . Anxiety   . Arthritis   . Cataract   . Cervicalgia   . Chronic lower back pain   . Closed compression fracture of L1 lumbar vertebra ([place]) 11/2012  . Collagen vascular disease ([place])   . COPD (chronic obstructive pulmonary disease) ([place])    unspecified  . DDD (degenerative disc disease)   . Depression   . Diabetes mellitus ([place])   . Disorder of bursae and tendons in shoulder region    unspecified  . Fall 09/27/2016   Fall Feb 04, 2016 with broken vertebra  . GERD (gastroesophageal reflux disease)   . Headache   . Hereditary and idiopathic peripheral neuropathy    unspecified  . History of adenomatous polyp of colon    Followed by Dr. Tiffany Kocher  . History of tubal ligation   . Hypertension   . Intermediate coronary syndrome ([place])   . Lumbago   . Neuralgia and neuritis, unspecified    radiculitis unspecified  . Rheumatoid arthritis ([place])   . Rheumatoid arthritis ([place])   . Senile osteoporosis   . Spinal stenosis, lumbar region without neurogenic claudication     Past Surgical History:  Procedure Laterality Date  . BACK SURGERY    . BREAST BIOPSY Left    negative  . CATARACT EXTRACTION W/ INTRAOCULAR LENS  IMPLANT, BILATERAL Bilateral   . COLONOSCOPY  08/17/2007   Adenomatous Polyps  . COLONOSCOPY  10/09/2010   PH Adenomatous polyps : [facility] 09/2015 ; Recall Ltr mailed 08/17/2015 (dw)  . ESOPHAGOGASTRODUODENOSCOPY  01/14/2008   No repeat per RTE  . FINGER ARTHROPLASTY Right 09/15/2014    Procedure: RIGHT MIDDLE FINGER, RING FINGER, SMALL FINGER EXTENSOR DIGITORUM COMMUMIS STABILIZATION WITH RIGHT MIDDLE FINGER MCP ARTHROPLASTY, POSSIBLE RING FINGER AND SMALL FINGER MCP ARTHROPLASTY;  Surgeon: Roseanne Kaufman, MD;  Location: [facility];  Service: Orthopedics;  Laterality: Right;  . FOOT SURGERY Bilateral   . HAND TENDON SURGERY Right 09/15/2014   "d/t RA"  . HUMERUS FRACTURE SURGERY Left   . INTRAMEDULLARY (IM) NAIL INTERTROCHANTERIC Right 02/19/2017   Procedure: INTRAMEDULLARY (IM) NAIL INTERTROCHANTRIC;  Surgeon: Dereck Leep, MD;  Location: [facility];  Service: Orthopedics;  Laterality: Right;  . KYPHOPLASTY N/A 01/04/2013   Procedure: L1 Kyphoplasty;  Surgeon: Hosie Spangle, MD;  Location: [facility];  Service: Neurosurgery;  Laterality: N/A;  L1 Kyphoplasty  . KYPHOPLASTY N/A 02/07/2014   Procedure: THORACIC SIX KYPHOPLASTY;  Surgeon: Hosie Spangle, MD;  Location: [facility];  Service: Neurosurgery;  Laterality: N/A;  T6 Kyphoplasty   . KYPHOPLASTY N/A 02/08/2016   Procedure: KYPHOPLASTY  T-12;  Surgeon: Hessie Knows, MD;  Location: [facility];  Service: Orthopedics;  Laterality: N/A;  . KYPHOPLASTY N/A 10/01/2016   Procedure: KYPHOPLASTY  L-5;  Surgeon: Hessie Knows, MD;  Location: [facility];  Service: Orthopedics;  Laterality: N/A;  . LUMBAR Sanders SURGERY  2014  X 3  . LUMBAR LAMINECTOMY    . ORIF ELBOW FRACTURE Right 02/19/2017   Procedure: OPEN REDUCTION INTERNAL FIXATION (ORIF) ELBOW/OLECRANON FRACTURE;  Surgeon: Dereck Leep, MD;  Location: [facility];  Service: Orthopedics;  Laterality: Right;  . PERIPHERAL VASCULAR CATHETERIZATION Left 12/07/2015   Procedure: Upper Extremity Angiography;  Surgeon: Algernon Huxley, MD;  Location: [place] [facility];  Service: Cardiovascular;  Laterality: Left;  . PERIPHERAL VASCULAR CATHETERIZATION  12/07/2015   Procedure: Upper Extremity Intervention;  Surgeon: Algernon Huxley, MD;  Location: [place] [facility];  Service: Cardiovascular;;   . POSTERIOR LUMBAR FUSION     "got screws in"  . TONSILLECTOMY    . TUBAL LIGATION    . WRIST FUSION Right    30 yrs ago    Family History  Problem Relation Age of Onset  . CAD Father   . Alcohol abuse Father   . Heart disease Father   . CAD Mother   . Hypertension Mother   . Peripheral vascular disease Mother   . Osteoarthritis Mother   . Heart disease Mother   . Breast cancer Maternal Aunt 4  . Alcohol abuse Sister   . Arthritis Sister   . Cancer Sister    Social History:  reports that she quit smoking about 20 months ago. Her smoking use included Cigarettes. She has a 56.00 pack-year smoking history. She has never used smokeless tobacco. She reports that she does not drink alcohol or use drugs.  Allergies:  Allergies  Allergen Reactions  . Ambien [Zolpidem Tartrate] Other (See Comments)    Over sedation   . Dilaudid [Hydromorphone Hcl] Other (See Comments)    Made tongue swell  . Gold-Containing Drug Products Anaphylaxis  . Adhesive [Tape] Other (See Comments)    Tears skin  . Haldol [Haloperidol] Other (See Comments)    Hallucinations    Medications Prior to Admission  Medication Sig Dispense Refill  . acetaminophen (TYLENOL) 500 MG tablet Take 1,000 mg by mouth daily as needed for mild pain or headache.     . albuterol (PROVENTIL HFA;VENTOLIN HFA) 108 (90 Base) MCG/ACT inhaler Inhale 2 puffs into the lungs every 6 (six) hours as needed for wheezing or shortness of breath.    . bisacodyl (DULCOLAX) 10 MG suppository Place 1 suppository (10 mg total) rectally daily as needed for moderate constipation. 12 suppository 0  . Calcium Carbonate-Vitamin D 600-400 MG-UNIT tablet Take 1 tablet by mouth daily.     . Cholecalciferol 4000 units CAPS Take 4,000 Units by mouth daily.     . DULoxetine (CYMBALTA) 30 MG capsule Take 30 mg by mouth at bedtime. *Note dose*    . DULoxetine (CYMBALTA) 60 MG capsule Take 60 mg by mouth every morning. *Note dose*    . etanercept  (ENBREL SURECLICK) 50 MG/ML injection Inject 50 mg into the skin once a week. Fridays    . ferrous sulfate 325 (65 FE) MG tablet Take 1 tablet (325 mg total) by mouth 2 (two) times daily with a meal. 60 tablet 3  . Fluticasone Furoate-Vilanterol (BREO ELLIPTA) 100-25 MCG/INH AEPB Inhale 1 puff into the lungs every morning.    . folic acid (FOLVITE) 1 MG tablet Take 1 mg by mouth daily.    Marland Kitchen gabapentin (NEURONTIN) 300 MG capsule Take 600 mg by mouth 2 (two) times daily as needed (restless legs).     Marland Kitchen ibuprofen (ADVIL,MOTRIN) 200 MG tablet Take 400 mg by mouth every 8 (eight) hours  as needed for mild pain. Reported on 12/07/2015    . METHOTREXATE SODIUM, PF, IJ Inject 0.8 mLs into the skin once a week. Methotrexate sodium solution (25 mg/ ml ) Give 0.8 ml on Friday.    . Nutritional Supplements (ENSURE ENLIVE PO) Take 1 Bottle by mouth 2 (two) times daily between meals.    Marland Kitchen omeprazole (PRILOSEC) 20 MG capsule Take 20 mg by mouth 2 (two) times daily.     Marland Kitchen oxymorphone (OPANA) 5 MG tablet Take 1 tablet (5 mg total) by mouth every 4 (four) hours as needed for pain. 120 tablet 0  . polyethylene glycol (MIRALAX / GLYCOLAX) packet Take 17 g by mouth daily as needed for mild constipation.     . QUEtiapine (SEROQUEL) 100 MG tablet Take 1.5 tablets (150 mg total) by mouth at bedtime. 60 tablet 0  . senna (SENOKOT) 8.6 MG tablet Take 1 tablet by mouth daily as needed for constipation.     . sulfamethoxazole-trimethoprim (BACTRIM DS,SEPTRA DS) 800-160 MG tablet Take 1 tablet by mouth 2 (two) times daily.    Marland Kitchen trolamine salicylate (ASPERCREME) 10 % cream Apply 1 application topically 2 (two) times daily as needed for muscle pain.    . vitamin C (ASCORBIC ACID) 500 MG tablet Take 500 mg by mouth daily.    Marland Kitchen aspirin EC 81 MG tablet Take 81 mg by mouth daily.    Marland Kitchen enoxaparin (LOVENOX) 40 MG/0.4ML injection Inject 0.4 mLs (40 mg total) into the skin daily. 14 Syringe 0  . HYDROcodone-acetaminophen (NORCO/VICODIN)  5-325 MG tablet Take 1 tablet by mouth every 6 (six) hours as needed for moderate pain. (Patient not taking: Reported on 04/14/2017) 120 tablet 0  . metoprolol tartrate (LOPRESSOR) 25 MG tablet Take 25 mg by mouth 2 (two) times daily.      Results for orders placed or performed during the hospital encounter of 04/16/17 (from the past 48 hour(s))  Basic metabolic panel     Status: Abnormal   Collection Time: 04/16/17  1:07 PM  Result Value Ref Range   Sodium 137 135 - 145 mmol/L   Potassium 3.7 3.5 - 5.1 mmol/L   Chloride 103 101 - 111 mmol/L   CO2 24 22 - 32 mmol/L   Glucose, Bld 173 (H) 65 - 99 mg/dL   BUN 20 6 - 20 mg/dL   Creatinine, Ser 0.85 0.44 - 1.00 mg/dL   Calcium 9.0 8.9 - 10.3 mg/dL   GFR calc non Af Amer >60 >60 mL/min   GFR calc Af Amer >60 >60 mL/min    Comment: (NOTE) The eGFR has been calculated using the CKD EPI equation. This calculation has not been validated in all clinical situations. eGFR's persistently <60 mL/min signify possible Chronic Kidney Disease.    Anion gap 10 5 - 15  CBC with Differential/Platelet     Status: Abnormal   Collection Time: 04/16/17  1:07 PM  Result Value Ref Range   WBC 15.2 (H) 3.6 - 11.0 K/uL   RBC 4.24 3.80 - 5.20 MIL/uL   Hemoglobin 12.9 12.0 - 16.0 g/dL   HCT 38.1 35.0 - 47.0 %   MCV 89.7 80.0 - 100.0 fL   MCH 30.4 26.0 - 34.0 pg   MCHC 33.9 32.0 - 36.0 g/dL   RDW 19.2 (H) 11.5 - 14.5 %   Platelets 769 (H) 150 - 440 K/uL   Neutrophils Relative % 72 %   Neutro Abs 11.0 (H) 1.4 - 6.5 K/uL  Lymphocytes Relative 16 %   Lymphs Abs 2.4 1.0 - 3.6 K/uL   Monocytes Relative 8 %   Monocytes Absolute 1.3 (H) 0.2 - 0.9 K/uL   Eosinophils Relative 3 %   Eosinophils Absolute 0.4 0 - 0.7 K/uL   Basophils Relative 1 %   Basophils Absolute 0.2 (H) 0 - 0.1 K/uL   No results found.  Review of Systems  Constitutional: Negative.   HENT: Negative.   Eyes: Negative.   Respiratory: Negative.   Cardiovascular: Negative.    Gastrointestinal: Negative.   Genitourinary: Negative.   Musculoskeletal: Negative.   Skin: Negative.   Psychiatric/Behavioral: Negative.     Blood pressure (!) 163/81, pulse (!) 117, temperature 97.7 F (36.5 C), temperature source Tympanic, resp. rate 16, height 5' 2" (1.575 m), weight 58.1 kg (128 lb), SpO2 97 %. Physical Exam  Constitutional: She appears well-developed and well-nourished.  HENT:  Head: Normocephalic and atraumatic.  Eyes: Pupils are equal, round, and reactive to light. EOM are normal.  Cardiovascular: Normal rate.   Respiratory: Effort normal.  GI: Soft. She exhibits no distension.  Neurological: She is alert.  Skin: Skin is warm.  Psychiatric: She has a normal mood and affect. Her behavior is normal. Judgment and thought content normal.     Assessment/Plan Debridement of right elbow wound with placement of Acell and VAC.  Wallace Going, DO 04/16/2017, 4:02 PM

## 2017-04-17 ENCOUNTER — Encounter: Payer: Self-pay | Admitting: Orthopedic Surgery

## 2017-04-18 LAB — URINE CULTURE: Culture: NO GROWTH

## 2017-05-09 ENCOUNTER — Inpatient Hospital Stay
Admission: EM | Admit: 2017-05-09 | Discharge: 2017-05-16 | DRG: 689 | Disposition: A | Discharge status: Home Health | Payer: Medicare Other | Attending: Internal Medicine | Admitting: Internal Medicine

## 2017-05-09 DIAGNOSIS — R000 Tachycardia, unspecified: Secondary | ICD-10-CM | POA: Diagnosis present

## 2017-05-09 DIAGNOSIS — Z23 Encounter for immunization: Secondary | ICD-10-CM | POA: Diagnosis present

## 2017-05-09 DIAGNOSIS — M810 Age-related osteoporosis without current pathological fracture: Secondary | ICD-10-CM | POA: Diagnosis present

## 2017-05-09 DIAGNOSIS — E876 Hypokalemia: Secondary | ICD-10-CM | POA: Diagnosis present

## 2017-05-09 DIAGNOSIS — R627 Adult failure to thrive: Secondary | ICD-10-CM | POA: Diagnosis present

## 2017-05-09 DIAGNOSIS — N390 Urinary tract infection, site not specified: Principal | ICD-10-CM | POA: Diagnosis present

## 2017-05-09 DIAGNOSIS — Z888 Allergy status to other drugs, medicaments and biological substances status: Secondary | ICD-10-CM

## 2017-05-09 DIAGNOSIS — Z8249 Family history of ischemic heart disease and other diseases of the circulatory system: Secondary | ICD-10-CM

## 2017-05-09 DIAGNOSIS — L98499 Non-pressure chronic ulcer of skin of other sites with unspecified severity: Secondary | ICD-10-CM | POA: Diagnosis present

## 2017-05-09 DIAGNOSIS — Z86010 Personal history of colonic polyps: Secondary | ICD-10-CM

## 2017-05-09 DIAGNOSIS — I10 Essential (primary) hypertension: Secondary | ICD-10-CM | POA: Diagnosis present

## 2017-05-09 DIAGNOSIS — F05 Delirium due to known physiological condition: Secondary | ICD-10-CM

## 2017-05-09 DIAGNOSIS — G9349 Other encephalopathy: Secondary | ICD-10-CM | POA: Diagnosis present

## 2017-05-09 DIAGNOSIS — Z8261 Family history of arthritis: Secondary | ICD-10-CM

## 2017-05-09 DIAGNOSIS — K5900 Constipation, unspecified: Secondary | ICD-10-CM | POA: Diagnosis present

## 2017-05-09 DIAGNOSIS — R531 Weakness: Secondary | ICD-10-CM | POA: Diagnosis present

## 2017-05-09 DIAGNOSIS — Z9109 Other allergy status, other than to drugs and biological substances: Secondary | ICD-10-CM

## 2017-05-09 DIAGNOSIS — Z9842 Cataract extraction status, left eye: Secondary | ICD-10-CM

## 2017-05-09 DIAGNOSIS — E119 Type 2 diabetes mellitus without complications: Secondary | ICD-10-CM | POA: Diagnosis present

## 2017-05-09 DIAGNOSIS — F419 Anxiety disorder, unspecified: Secondary | ICD-10-CM | POA: Diagnosis present

## 2017-05-09 DIAGNOSIS — Z87440 Personal history of urinary (tract) infections: Secondary | ICD-10-CM

## 2017-05-09 DIAGNOSIS — M542 Cervicalgia: Secondary | ICD-10-CM | POA: Diagnosis present

## 2017-05-09 DIAGNOSIS — K219 Gastro-esophageal reflux disease without esophagitis: Secondary | ICD-10-CM | POA: Diagnosis present

## 2017-05-09 DIAGNOSIS — Z9851 Tubal ligation status: Secondary | ICD-10-CM

## 2017-05-09 DIAGNOSIS — J449 Chronic obstructive pulmonary disease, unspecified: Secondary | ICD-10-CM | POA: Diagnosis present

## 2017-05-09 DIAGNOSIS — R410 Disorientation, unspecified: Secondary | ICD-10-CM | POA: Diagnosis present

## 2017-05-09 DIAGNOSIS — M069 Rheumatoid arthritis, unspecified: Secondary | ICD-10-CM | POA: Diagnosis present

## 2017-05-09 DIAGNOSIS — Z7951 Long term (current) use of inhaled steroids: Secondary | ICD-10-CM

## 2017-05-09 DIAGNOSIS — G8929 Other chronic pain: Secondary | ICD-10-CM | POA: Diagnosis present

## 2017-05-09 DIAGNOSIS — Z7982 Long term (current) use of aspirin: Secondary | ICD-10-CM

## 2017-05-09 DIAGNOSIS — K5909 Other constipation: Secondary | ICD-10-CM | POA: Diagnosis present

## 2017-05-09 DIAGNOSIS — I959 Hypotension, unspecified: Secondary | ICD-10-CM | POA: Diagnosis not present

## 2017-05-09 DIAGNOSIS — Z79899 Other long term (current) drug therapy: Secondary | ICD-10-CM

## 2017-05-09 DIAGNOSIS — D72829 Elevated white blood cell count, unspecified: Secondary | ICD-10-CM

## 2017-05-09 DIAGNOSIS — Z87891 Personal history of nicotine dependence: Secondary | ICD-10-CM

## 2017-05-09 DIAGNOSIS — Z79891 Long term (current) use of opiate analgesic: Secondary | ICD-10-CM

## 2017-05-09 DIAGNOSIS — B9689 Other specified bacterial agents as the cause of diseases classified elsewhere: Secondary | ICD-10-CM | POA: Diagnosis present

## 2017-05-09 DIAGNOSIS — F329 Major depressive disorder, single episode, unspecified: Secondary | ICD-10-CM | POA: Diagnosis present

## 2017-05-09 DIAGNOSIS — M545 Low back pain: Secondary | ICD-10-CM | POA: Diagnosis present

## 2017-05-09 DIAGNOSIS — Z961 Presence of intraocular lens: Secondary | ICD-10-CM | POA: Diagnosis present

## 2017-05-09 DIAGNOSIS — R112 Nausea with vomiting, unspecified: Secondary | ICD-10-CM | POA: Diagnosis not present

## 2017-05-09 DIAGNOSIS — E43 Unspecified severe protein-calorie malnutrition: Secondary | ICD-10-CM | POA: Diagnosis present

## 2017-05-09 DIAGNOSIS — Z6822 Body mass index (BMI) 22.0-22.9, adult: Secondary | ICD-10-CM

## 2017-05-09 DIAGNOSIS — G609 Hereditary and idiopathic neuropathy, unspecified: Secondary | ICD-10-CM | POA: Diagnosis present

## 2017-05-09 DIAGNOSIS — M25512 Pain in left shoulder: Secondary | ICD-10-CM | POA: Diagnosis present

## 2017-05-09 DIAGNOSIS — Z9841 Cataract extraction status, right eye: Secondary | ICD-10-CM

## 2017-05-09 DIAGNOSIS — Z885 Allergy status to narcotic agent status: Secondary | ICD-10-CM

## 2017-05-09 LAB — COMPREHENSIVE METABOLIC PANEL
ALBUMIN: 2.7 g/dL — AB (ref 3.5–5.0)
ALK PHOS: 145 U/L — AB (ref 38–126)
ALT: 11 U/L — AB (ref 14–54)
AST: 31 U/L (ref 15–41)
Anion gap: 14 (ref 5–15)
BUN: 18 mg/dL (ref 6–20)
CALCIUM: 8.9 mg/dL (ref 8.9–10.3)
CO2: 23 mmol/L (ref 22–32)
CREATININE: 0.88 mg/dL (ref 0.44–1.00)
Chloride: 96 mmol/L — ABNORMAL LOW (ref 101–111)
GFR calc Af Amer: >60 mL/min (ref >60)
GFR calc non Af Amer: >60 mL/min (ref >60)
GLUCOSE: 140 mg/dL — AB (ref 65–99)
Potassium: 3.3 mmol/L — ABNORMAL LOW (ref 3.5–5.1)
SODIUM: 133 mmol/L — AB (ref 135–145)
Total Bilirubin: 0.4 mg/dL (ref 0.3–1.2)
Total Protein: 8.3 g/dL — ABNORMAL HIGH (ref 6.5–8.1)

## 2017-05-09 LAB — CBC WITH DIFFERENTIAL/PLATELET
BASOS PCT: 1 %
Basophils Absolute: 0.1 10*3/uL (ref 0–0.1)
EOS ABS: 0.4 10*3/uL (ref 0–0.7)
Eosinophils Relative: 3 %
HEMATOCRIT: 36.8 % (ref 35.0–47.0)
Hemoglobin: 12.1 g/dL (ref 12.0–16.0)
Lymphocytes Relative: 18 %
Lymphs Abs: 2.5 10*3/uL (ref 1.0–3.6)
MCH: 28.7 pg (ref 26.0–34.0)
MCHC: 32.8 g/dL (ref 32.0–36.0)
MCV: 87.6 fL (ref 80.0–100.0)
MONO ABS: 1.1 10*3/uL — AB (ref 0.2–0.9)
MONOS PCT: 8 %
NEUTROS ABS: 9.5 10*3/uL — AB (ref 1.4–6.5)
Neutrophils Relative %: 70 %
Platelets: 804 10*3/uL — ABNORMAL HIGH (ref 150–440)
RBC: 4.2 MIL/uL (ref 3.80–5.20)
RDW: 17.3 % — AB (ref 11.5–14.5)
WBC: 13.5 10*3/uL — ABNORMAL HIGH (ref 3.6–11.0)

## 2017-05-09 LAB — URINALYSIS, COMPLETE (UACMP) WITH MICROSCOPIC
BACTERIA UA: NONE SEEN
BILIRUBIN URINE: NEGATIVE
Glucose, UA: NEGATIVE mg/dL
Hgb urine dipstick: NEGATIVE
KETONES UR: NEGATIVE mg/dL
NITRITE: NEGATIVE
PROTEIN: 100 mg/dL — AB
SPECIFIC GRAVITY, URINE: 1.027 (ref 1.005–1.030)
pH: 5 (ref 5.0–8.0)

## 2017-05-09 LAB — LACTIC ACID, PLASMA: Lactic Acid, Venous: 1.6 mmol/L (ref 0.5–1.9)

## 2017-05-09 MED ORDER — MORPHINE SULFATE (PF) 4 MG/ML IV SOLN
5.0000 mg | Freq: Once | INTRAVENOUS | Status: AC
  Administered 2017-05-09: 5 mg via INTRAVENOUS

## 2017-05-09 MED ORDER — DULOXETINE HCL 30 MG PO CPEP
30.0000 mg | ORAL_CAPSULE | Freq: Every day | ORAL | Status: DC
Start: 2017-05-09 — End: 2017-05-16
  Administered 2017-05-09 – 2017-05-15 (×7): 30 mg via ORAL
  Filled 2017-05-09 (×7): qty 1

## 2017-05-09 MED ORDER — IBUPROFEN 400 MG PO TABS
400.0000 mg | ORAL_TABLET | Freq: Three times a day (TID) | ORAL | Status: DC | PRN
  Administered 2017-05-12: 20:00:00 400 mg via ORAL
  Filled 2017-05-09: qty 1

## 2017-05-09 MED ORDER — SODIUM CHLORIDE 0.9% FLUSH
3.0000 mL | INTRAVENOUS | Status: DC | PRN
  Administered 2017-05-11: 19:00:00 3 mL via INTRAVENOUS
  Filled 2017-05-09: qty 3

## 2017-05-09 MED ORDER — DEXTROSE 5 % IV SOLN
1.0000 g | INTRAVENOUS | Status: DC
  Administered 2017-05-09 – 2017-05-11 (×3): 1 g via INTRAVENOUS
  Filled 2017-05-09 (×3): qty 10

## 2017-05-09 MED ORDER — FERROUS SULFATE 325 (65 FE) MG PO TABS
325.0000 mg | ORAL_TABLET | Freq: Two times a day (BID) | ORAL | Status: DC
  Administered 2017-05-10 – 2017-05-16 (×12): 325 mg via ORAL
  Filled 2017-05-09 (×12): qty 1

## 2017-05-09 MED ORDER — ONDANSETRON HCL 4 MG/2ML IJ SOLN: 4.0000 mg | Freq: Four times a day (QID) | INTRAMUSCULAR | Status: DC | PRN

## 2017-05-09 MED ORDER — CEFTRIAXONE SODIUM IN DEXTROSE 20 MG/ML IV SOLN
INTRAVENOUS | Status: AC
  Filled 2017-05-09: qty 50

## 2017-05-09 MED ORDER — HYDROCODONE-ACETAMINOPHEN 5-325 MG PO TABS
1.0000 | ORAL_TABLET | ORAL | Status: DC | PRN
  Administered 2017-05-09 – 2017-05-11 (×3): 1 via ORAL
  Filled 2017-05-09 (×3): qty 1

## 2017-05-09 MED ORDER — PANTOPRAZOLE SODIUM 40 MG PO TBEC
40.0000 mg | DELAYED_RELEASE_TABLET | Freq: Every day | ORAL | Status: DC
  Administered 2017-05-10 – 2017-05-14 (×5): 40 mg via ORAL
  Filled 2017-05-09 (×6): qty 1

## 2017-05-09 MED ORDER — MORPHINE SULFATE (PF) 4 MG/ML IV SOLN
INTRAVENOUS | Status: AC
  Administered 2017-05-09: 5 mg via INTRAVENOUS
  Filled 2017-05-09: qty 1

## 2017-05-09 MED ORDER — POLYETHYLENE GLYCOL 3350 17 G PO PACK: 17.0000 g | PACK | Freq: Every day | ORAL | Status: DC | PRN

## 2017-05-09 MED ORDER — ASPIRIN EC 81 MG PO TBEC
81.0000 mg | DELAYED_RELEASE_TABLET | Freq: Every day | ORAL | Status: DC
  Administered 2017-05-10 – 2017-05-13 (×4): 81 mg via ORAL
  Filled 2017-05-09 (×5): qty 1

## 2017-05-09 MED ORDER — SODIUM CHLORIDE 0.9 % IV SOLN: 250.0000 mL | INTRAVENOUS | Status: DC | PRN

## 2017-05-09 MED ORDER — ONDANSETRON HCL 4 MG PO TABS: 4.0000 mg | ORAL_TABLET | Freq: Four times a day (QID) | ORAL | Status: DC | PRN

## 2017-05-09 MED ORDER — ACETAMINOPHEN 500 MG PO TABS: 1000.0000 mg | ORAL_TABLET | Freq: Every day | ORAL | Status: DC | PRN

## 2017-05-09 MED ORDER — CALCIUM CARBONATE-VITAMIN D 600-400 MG-UNIT PO TABS: 1.0000 | ORAL_TABLET | Freq: Every day | ORAL | Status: DC

## 2017-05-09 MED ORDER — MORPHINE SULFATE 15 MG PO TABS
15.0000 mg | ORAL_TABLET | ORAL | Status: DC | PRN
Start: 2017-05-09 — End: 2017-05-11
  Administered 2017-05-10 (×2): 15 mg via ORAL
  Filled 2017-05-09 (×2): qty 1

## 2017-05-09 MED ORDER — ALBUTEROL SULFATE (2.5 MG/3ML) 0.083% IN NEBU: 2.5000 mg | INHALATION_SOLUTION | Freq: Four times a day (QID) | RESPIRATORY_TRACT | Status: DC | PRN

## 2017-05-09 MED ORDER — QUETIAPINE FUMARATE 25 MG PO TABS
150.0000 mg | ORAL_TABLET | Freq: Every day | ORAL | Status: DC
  Administered 2017-05-09 – 2017-05-10 (×2): 150 mg via ORAL
  Filled 2017-05-09 (×2): qty 6

## 2017-05-09 MED ORDER — DULOXETINE HCL 30 MG PO CPEP
60.0000 mg | ORAL_CAPSULE | ORAL | Status: DC
  Administered 2017-05-10 – 2017-05-16 (×7): 60 mg via ORAL
  Filled 2017-05-09 (×8): qty 2

## 2017-05-09 MED ORDER — ACETAMINOPHEN 325 MG PO TABS: 650.0000 mg | ORAL_TABLET | Freq: Four times a day (QID) | ORAL | Status: DC | PRN

## 2017-05-09 MED ORDER — ACETAMINOPHEN 650 MG RE SUPP: 650.0000 mg | Freq: Four times a day (QID) | RECTAL | Status: DC | PRN

## 2017-05-09 MED ORDER — FLUTICASONE FUROATE-VILANTEROL 100-25 MCG/INH IN AEPB
1.0000 | INHALATION_SPRAY | Freq: Every morning | RESPIRATORY_TRACT | Status: DC
  Administered 2017-05-10 – 2017-05-16 (×7): 1 via RESPIRATORY_TRACT
  Filled 2017-05-09: qty 28

## 2017-05-09 MED ORDER — CEPHALEXIN 500 MG PO CAPS
500.0000 mg | ORAL_CAPSULE | Freq: Once | ORAL | Status: AC
  Administered 2017-05-09: 500 mg via ORAL
  Filled 2017-05-09: qty 1

## 2017-05-09 MED ORDER — GABAPENTIN 300 MG PO CAPS
600.0000 mg | ORAL_CAPSULE | Freq: Two times a day (BID) | ORAL | Status: DC | PRN
  Administered 2017-05-09 – 2017-05-10 (×2): 600 mg via ORAL
  Filled 2017-05-09 (×2): qty 2

## 2017-05-09 MED ORDER — VITAMIN D 1000 UNITS PO TABS
4000.0000 [IU] | ORAL_TABLET | Freq: Every day | ORAL | Status: DC
  Administered 2017-05-10 – 2017-05-16 (×7): 4000 [IU] via ORAL
  Filled 2017-05-09 (×7): qty 4

## 2017-05-09 MED ORDER — ENOXAPARIN SODIUM 40 MG/0.4ML ~~LOC~~ SOLN
40.0000 mg | SUBCUTANEOUS | Status: DC
  Administered 2017-05-09 – 2017-05-15 (×7): 40 mg via SUBCUTANEOUS
  Filled 2017-05-09 (×7): qty 0.4

## 2017-05-09 MED ORDER — SODIUM CHLORIDE 0.9 % IV BOLUS (SEPSIS)
500.0000 mL | Freq: Once | INTRAVENOUS | Status: AC
Start: 2017-05-09 — End: 2017-05-09
  Administered 2017-05-09: 500 mL via INTRAVENOUS

## 2017-05-09 MED ORDER — FOLIC ACID 1 MG PO TABS
1.0000 mg | ORAL_TABLET | Freq: Every day | ORAL | Status: DC
  Administered 2017-05-10 – 2017-05-16 (×7): 1 mg via ORAL
  Filled 2017-05-09 (×7): qty 1

## 2017-05-09 MED ORDER — SODIUM CHLORIDE 0.9% FLUSH
3.0000 mL | Freq: Two times a day (BID) | INTRAVENOUS | Status: DC
  Administered 2017-05-10 – 2017-05-11 (×5): 3 mL via INTRAVENOUS

## 2017-05-09 MED ORDER — CALCIUM CARBONATE-VITAMIN D 500-200 MG-UNIT PO TABS
2.0000 | ORAL_TABLET | Freq: Every day | ORAL | Status: DC
  Administered 2017-05-10 – 2017-05-16 (×7): 2 via ORAL
  Filled 2017-05-09 (×7): qty 2

## 2017-05-09 MED ORDER — INFLUENZA VAC SPLIT HIGH-DOSE 0.5 ML IM SUSY
0.5000 mL | PREFILLED_SYRINGE | INTRAMUSCULAR | Status: DC
  Filled 2017-05-09 (×2): qty 0.5

## 2017-05-09 MED ORDER — MORPHINE SULFATE (PF) 2 MG/ML IV SOLN
INTRAVENOUS | Status: AC
Start: 2017-05-09 — End: 2017-05-10
  Filled 2017-05-09: qty 1

## 2017-05-09 MED ORDER — METOPROLOL TARTRATE 25 MG PO TABS
25.0000 mg | ORAL_TABLET | Freq: Two times a day (BID) | ORAL | Status: DC
  Administered 2017-05-09 – 2017-05-14 (×10): 25 mg via ORAL
  Filled 2017-05-09 (×11): qty 1

## 2017-05-09 MED ORDER — VITAMIN C 500 MG PO TABS
500.0000 mg | ORAL_TABLET | Freq: Every day | ORAL | Status: DC
  Administered 2017-05-10 – 2017-05-11 (×2): 500 mg via ORAL
  Filled 2017-05-09 (×3): qty 1

## 2017-05-09 NOTE — ED Notes (Signed)
Admitting MD at bedside.

## 2017-05-09 NOTE — ED Notes (Addendum)
Lab contacted for phlebotomist to draw lactic. Multiple attempts made by RNs in triage

## 2017-05-09 NOTE — H&P (Signed)
[employer] - [place] at [facility]   PATIENT NAME: Michelle Cabrera    MR#:  [id number]  DATE OF BIRTH:  November 15, 1945  DATE OF ADMISSION:  05/09/2017  PRIMARY CARE PHYSICIAN: Lauro Regulus, MD   REQUESTING/REFERRING PHYSICIAN: Rockney Ghee MD  CHIEF COMPLAINT:   Chief Complaint  Patient presents with  . Urinary Tract Infection    HISTORY OF PRESENT ILLNESS: Michelle Cabrera  is a 71 y.o. female with a known history of multiple medical problems including anxiety, chronic lower back pain, COPD, depression,also has a wound VAC in her right arm presenting to the emergency room with complaint of having urinary frequency urgency and hesitancy. Patient has been treated for 2 weeks as outpatient initially with Macrobid and then after Bactrim. She has had some confusion according to the husband brought her to the ER she is noted to have a leukocytosis and a positive urinary tract infection. Patient also has had some nausea. Low-grade temperatures at home.  PAST MEDICAL HISTORY:   Past Medical History:  Diagnosis Date  . Anxiety   . Arthritis   . Cataract   . Cervicalgia   . Chronic lower back pain   . Closed compression fracture of L1 lumbar vertebra (HCC) 11/2012  . Collagen vascular disease (HCC)   . COPD (chronic obstructive pulmonary disease) (HCC)    unspecified  . DDD (degenerative disc disease)   . Depression   . Diabetes mellitus (HCC)   . Disorder of bursae and tendons in shoulder region    unspecified  . Fall 09/27/2016   Fall Feb 04, 2016 with broken vertebra  . GERD (gastroesophageal reflux disease)   . Headache   . Hereditary and idiopathic peripheral neuropathy    unspecified  . History of adenomatous polyp of colon    Followed by Dr. Markham Jordan  . History of tubal ligation   . Hypertension   . Intermediate coronary syndrome (HCC)   . Lumbago   . Neuralgia and neuritis, unspecified    radiculitis unspecified  . Rheumatoid arthritis (HCC)   .  Rheumatoid arthritis (HCC)   . Senile osteoporosis   . Spinal stenosis, lumbar region without neurogenic claudication     PAST SURGICAL HISTORY: Past Surgical History:  Procedure Laterality Date  . BACK SURGERY    . BREAST BIOPSY Left    negative  . CATARACT EXTRACTION W/ INTRAOCULAR LENS  IMPLANT, BILATERAL Bilateral   . COLONOSCOPY  08/17/2007   Adenomatous Polyps  . COLONOSCOPY  10/09/2010   PH Adenomatous polyps : [facility] 09/2015 ; Recall Ltr mailed 08/17/2015 (dw)  . ESOPHAGOGASTRODUODENOSCOPY  01/14/2008   No repeat per RTE  . FINGER ARTHROPLASTY Right 09/15/2014   Procedure: RIGHT MIDDLE FINGER, RING FINGER, SMALL FINGER EXTENSOR DIGITORUM COMMUMIS STABILIZATION WITH RIGHT MIDDLE FINGER MCP ARTHROPLASTY, POSSIBLE RING FINGER AND SMALL FINGER MCP ARTHROPLASTY;  Surgeon: Dominica Severin, MD;  Location: [facility] OR;  Service: Orthopedics;  Laterality: Right;  . FOOT SURGERY Bilateral   . HAND TENDON SURGERY Right 09/15/2014   "d/t RA"  . HARDWARE REMOVAL Right 04/16/2017   Procedure: HARDWARE REMOVAL;  Surgeon: Donato Heinz, MD;  Location: [facility] ORS;  Service: Orthopedics;  Laterality: Right;  . HUMERUS FRACTURE SURGERY Left   . INCISION AND DRAINAGE OF WOUND Right 04/16/2017   Procedure: IRRIGATION AND DEBRIDEMENT WOUND;  Surgeon: Peggye Form, DO;  Location: [facility] ORS;  Service: Plastics;  Laterality: Right;  . INTRAMEDULLARY (IM) NAIL INTERTROCHANTERIC Right 02/19/2017   Procedure: INTRAMEDULLARY (IM)  NAIL INTERTROCHANTRIC;  Surgeon: Donato Heinz, MD;  Location: [facility] ORS;  Service: Orthopedics;  Laterality: Right;  . KYPHOPLASTY N/A 01/04/2013   Procedure: L1 Kyphoplasty;  Surgeon: Hewitt Shorts, MD;  Location: [facility];  Service: Neurosurgery;  Laterality: N/A;  L1 Kyphoplasty  . KYPHOPLASTY N/A 02/07/2014   Procedure: THORACIC SIX KYPHOPLASTY;  Surgeon: Hewitt Shorts, MD;  Location: [facility];  Service: Neurosurgery;  Laterality: N/A;  T6 Kyphoplasty   . KYPHOPLASTY  N/A 02/08/2016   Procedure: KYPHOPLASTY  T-12;  Surgeon: Kennedy Bucker, MD;  Location: [facility] ORS;  Service: Orthopedics;  Laterality: N/A;  . KYPHOPLASTY N/A 10/01/2016   Procedure: KYPHOPLASTY  L-5;  Surgeon: Kennedy Bucker, MD;  Location: [facility] ORS;  Service: Orthopedics;  Laterality: N/A;  . LUMBAR DISC SURGERY  2014    X 3  . LUMBAR LAMINECTOMY    . ORIF ELBOW FRACTURE Right 02/19/2017   Procedure: OPEN REDUCTION INTERNAL FIXATION (ORIF) ELBOW/OLECRANON FRACTURE;  Surgeon: Donato Heinz, MD;  Location: [facility] ORS;  Service: Orthopedics;  Laterality: Right;  . PERIPHERAL VASCULAR CATHETERIZATION Left 12/07/2015   Procedure: Upper Extremity Angiography;  Surgeon: Annice Needy, MD;  Location: [facility] INVASIVE CV [facility];  Service: Cardiovascular;  Laterality: Left;  . PERIPHERAL VASCULAR CATHETERIZATION  12/07/2015   Procedure: Upper Extremity Intervention;  Surgeon: Annice Needy, MD;  Location: [facility];  Service: Cardiovascular;;  . POSTERIOR LUMBAR FUSION     "got screws in"  . TONSILLECTOMY    . TUBAL LIGATION    . WRIST FUSION Right    30 yrs ago    SOCIAL HISTORY:  Social History  Substance Use Topics  . Smoking status: Former Smoker    Packs/day: 1.00    Years: 56.00    Types: Cigarettes    Quit date: 07/25/2015  . Smokeless tobacco: Never Used  . Alcohol use No    FAMILY HISTORY:  Family History  Problem Relation Age of Onset  . CAD Father   . Alcohol abuse Father   . Heart disease Father   . CAD Mother   . Hypertension Mother   . Peripheral vascular disease Mother   . Osteoarthritis Mother   . Heart disease Mother   . Breast cancer Maternal Aunt 50  . Alcohol abuse Sister   . Arthritis Sister   . Cancer Sister     DRUG ALLERGIES:  Allergies  Allergen Reactions  . Ambien [Zolpidem Tartrate] Other (See Comments)    Over sedation   . Dilaudid [Hydromorphone Hcl] Other (See Comments)    Made tongue swell  . Gold-Containing Drug Products Anaphylaxis  . Adhesive  [Tape] Other (See Comments)    Tears skin  . Haldol [Haloperidol] Other (See Comments)    Hallucinations    REVIEW OF SYSTEMS:   CONSTITUTIONAL: No fever,positive fatigue or positive weakness.  EYES: No blurred or double vision.  EARS, NOSE, AND THROAT: No tinnitus or ear pain.  RESPIRATORY: No cough, shortness of breath, wheezing or hemoptysis.  CARDIOVASCULAR: No chest pain, orthopnea, edema.  GASTROINTESTINAL: No nausea, vomiting, diarrhea or abdominal pain.  GENITOURINARY: positive frequency urgency and hesitancy ENDOCRINE: No polyuria, nocturia,  HEMATOLOGY: No anemia, easy bruising or bleeding SKIN: No rash or lesion. MUSCULOSKELETAL: No joint pain or arthritis.  Chronic back pain NEUROLOGIC: No tingling, numbness, weakness.  PSYCHIATRY: No anxiety or depression.   MEDICATIONS AT HOME:  Prior to Admission medications   Medication Sig Start Date End Date Taking? Authorizing  Provider  acetaminophen (TYLENOL) 500 MG tablet Take 1,000 mg by mouth daily as needed for mild pain or headache.     [provider]  albuterol (PROVENTIL HFA;VENTOLIN HFA) 108 (90 Base) MCG/ACT inhaler Inhale 2 puffs into the lungs every 6 (six) hours as needed for wheezing or shortness of breath.    [provider]  aspirin EC 81 MG tablet Take 81 mg by mouth daily.    [provider]  bisacodyl (DULCOLAX) 10 MG suppository Place 1 suppository (10 mg total) rectally daily as needed for moderate constipation. 02/21/17   Altamese Dilling, MD  Calcium Carbonate-Vitamin D 600-400 MG-UNIT tablet Take 1 tablet by mouth daily.     [provider]  Cholecalciferol 4000 units CAPS Take 4,000 Units by mouth daily.     [provider]  DULoxetine (CYMBALTA) 30 MG capsule Take 30 mg by mouth at bedtime. *Note dose*    [provider]  DULoxetine (CYMBALTA) 60 MG capsule Take 60 mg by mouth every morning. *Note dose*    [provider]  enoxaparin  (LOVENOX) 40 MG/0.4ML injection Inject 0.4 mLs (40 mg total) into the skin daily. 02/22/17 03/08/17  Altamese Dilling, MD  etanercept (ENBREL SURECLICK) 50 MG/ML injection Inject 50 mg into the skin once a week. Fridays    [provider]  ferrous sulfate 325 (65 FE) MG tablet Take 1 tablet (325 mg total) by mouth 2 (two) times daily with a meal. 02/21/17   Altamese Dilling, MD  Fluticasone Furoate-Vilanterol (BREO ELLIPTA) 100-25 MCG/INH AEPB Inhale 1 puff into the lungs every morning.    [provider]  folic acid (FOLVITE) 1 MG tablet Take 1 mg by mouth daily.    [provider]  gabapentin (NEURONTIN) 300 MG capsule Take 600 mg by mouth 2 (two) times daily as needed (restless legs).     [provider]  HYDROcodone-acetaminophen (NORCO) 5-325 MG tablet Take 1 tablet by mouth every 4 (four) hours as needed for moderate pain. 04/16/17   Hooten, Illene Labrador, MD  ibuprofen (ADVIL,MOTRIN) 200 MG tablet Take 400 mg by mouth every 8 (eight) hours as needed for mild pain. Reported on 12/07/2015    [provider]  METHOTREXATE SODIUM, PF, IJ Inject 0.8 mLs into the skin once a week. Methotrexate sodium solution (25 mg/ ml ) Give 0.8 ml on Friday.    [provider]  metoprolol tartrate (LOPRESSOR) 25 MG tablet Take 25 mg by mouth 2 (two) times daily.    [provider]  Nutritional Supplements (ENSURE ENLIVE PO) Take 1 Bottle by mouth 2 (two) times daily between meals.    [provider]  omeprazole (PRILOSEC) 20 MG capsule Take 20 mg by mouth 2 (two) times daily.     [provider]  oxymorphone (OPANA) 5 MG tablet Take 1 tablet (5 mg total) by mouth every 4 (four) hours as needed for pain. 03/06/17   Lorenso Quarry, NP  polyethylene glycol (MIRALAX / GLYCOLAX) packet Take 17 g by mouth daily as needed for mild constipation.     [provider]  QUEtiapine (SEROQUEL) 100 MG tablet Take 1.5 tablets (150 mg total) by  mouth at bedtime. 11/11/15   Ramonita Lab, MD  senna (SENOKOT) 8.6 MG tablet Take 1 tablet by mouth daily as needed for constipation.     [provider]  sulfamethoxazole-trimethoprim (BACTRIM DS,SEPTRA DS) 800-160 MG tablet Take 1 tablet by mouth 2 (two) times daily.  [provider]  trolamine salicylate (ASPERCREME) 10 % cream Apply 1 application topically 2 (two) times daily as needed for muscle pain.    [provider]  vitamin C (ASCORBIC ACID) 500 MG tablet Take 500 mg by mouth daily.    [provider]      PHYSICAL EXAMINATION:   VITAL SIGNS: Blood pressure 131/72, pulse 95, temperature 99.2 F (37.3 C), temperature source Oral, resp. rate 16, height 5\' 2"  (1.575 m), weight 125 lb (56.7 kg), SpO2 97 %.  GENERAL:  71 y.o.-year-old patient lying in the bed with no acute distress.  EYES: Pupils equal, round, reactive to light and accommodation. No scleral icterus. Extraocular muscles intact.  HEENT: Head atraumatic, normocephalic. Oropharynx and nasopharynx clear.  NECK:  Supple, no jugular venous distention. No thyroid enlargement, no tenderness.  LUNGS: Normal breath sounds bilaterally, no wheezing, rales,rhonchi or crepitation. No use of accessory muscles of respiration.  CARDIOVASCULAR: S1, S2 normal. No murmurs, rubs, or gallops.  ABDOMEN: Soft, nontender, nondistended. Bowel sounds present. No organomegaly or mass.  EXTREMITIES: No pedal edema, cyanosis, or clubbing. Has wound VAC in place for the right arm NEUROLOGIC: Cranial nerves II through XII are intact. Muscle strength 5/5 in all extremities. Sensation intact. Gait not checked.  PSYCHIATRIC: The patient is alert and oriented x 3.  SKIN: No obvious rash, lesion, or ulcer.   LABORATORY PANEL:   CBC  Recent Labs Lab 05/09/17 1518  WBC 13.5*  HGB 12.1  HCT 36.8  PLT 804*  MCV 87.6  MCH 28.7  MCHC 32.8  RDW 17.3*  LYMPHSABS 2.5  MONOABS 1.1*  EOSABS 0.4  BASOSABS 0.1    ------------------------------------------------------------------------------------------------------------------  Chemistries   Recent Labs Lab 05/09/17 1518  NA 133*  K 3.3*  CL 96*  CO2 23  GLUCOSE 140*  BUN 18  CREATININE 0.88  CALCIUM 8.9  AST 31  ALT 11*  ALKPHOS 145*  BILITOT 0.4   ------------------------------------------------------------------------------------------------------------------ estimated creatinine clearance is 46.4 mL/min (by C-G formula based on SCr of 0.88 mg/dL). ------------------------------------------------------------------------------------------------------------------ No results for input(s): TSH, T4TOTAL, T3FREE, THYROIDAB in the last 72 hours.  Invalid input(s): FREET3   Coagulation profile No results for input(s): INR, PROTIME in the last 168 hours. ------------------------------------------------------------------------------------------------------------------- No results for input(s): DDIMER in the last 72 hours. -------------------------------------------------------------------------------------------------------------------  Cardiac Enzymes No results for input(s): CKMB, TROPONINI, MYOGLOBIN in the last 168 hours.  Invalid input(s): CK ------------------------------------------------------------------------------------------------------------------ Invalid input(s): POCBNP  ---------------------------------------------------------------------------------------------------------------  Urinalysis    Component Value Date/Time   COLORURINE AMBER (A) 05/09/2017 1519   APPEARANCEUR TURBID (A) 05/09/2017 1519   APPEARANCEUR HAZY 07/21/2013 1038   LABSPEC 1.027 05/09/2017 1519   LABSPEC 1.010 07/21/2013 1038   PHURINE 5.0 05/09/2017 1519   GLUCOSEU NEGATIVE 05/09/2017 1519   GLUCOSEU NEGATIVE 07/21/2013 1038   HGBUR NEGATIVE 05/09/2017 1519   BILIRUBINUR NEGATIVE 05/09/2017 1519   BILIRUBINUR NEGATIVE 07/21/2013 1038    KETONESUR NEGATIVE 05/09/2017 1519   PROTEINUR 100 (A) 05/09/2017 1519   UROBILINOGEN 0.2 12/27/2013 1612   NITRITE NEGATIVE 05/09/2017 1519   LEUKOCYTESUR TRACE (A) 05/09/2017 1519   LEUKOCYTESUR 2+ 07/21/2013 1038     RADIOLOGY: No results found.  EKG: Orders placed or performed during the hospital encounter of 02/18/17  . EKG 12-Lead  . EKG 12-Lead    IMPRESSION AND PLAN: Patient is a 71 year old female with multiple medical problems now withUTI this failed outpatient therapy  1. Urinary tract infection I will treat with IV ceftriaxone Follow urine cultures  2. COPD Will continue her home inhalers no evidence of exasperation  3. Chronic back pain continue her pain medications  4. Depression and anxiety continue Cymbalta, Seroquel  5. Essential hypertension continue metoprolol  6. Hypokalemia we'll replace potassium  7. Miscellaneous Lovenox for DVT prophylaxis  All the records are reviewed and case discussed with ED provider. Management plans discussed with the patient, family and they are in agreement.  CODE STATUS: Code Status History    Date Active Date Inactive Code Status Order ID Comments User Context   02/18/2017  5:30 AM 02/22/2017  3:44 PM Full Code [id number]  Ihor Austin, MD Inpatient   10/01/2016  1:20 PM 10/01/2016  6:20 PM Full Code [id number]  Kennedy Bucker, MD Inpatient   02/04/2016  4:31 PM 02/09/2016  5:14 PM Full Code [id number]  Marguarite Arbour, MD Inpatient   11/10/2015  4:58 AM 11/11/2015  6:48 PM Full Code [id number]  Enedina Finner, MD Inpatient   09/06/2015  4:04 AM 09/08/2015  8:04 PM Full Code [id number]  Ihor Austin, MD ED   07/26/2015  3:51 PM 08/01/2015  4:32 PM Full Code [id number]  Shaune Pollack, MD Inpatient   09/15/2014  6:36 PM 09/16/2014  5:22 PM Full Code [id number]  Dominica Severin, MD Inpatient   02/07/2014 11:04 AM 02/07/2014 10:39 PM Full Code [id number]  Hewitt Shorts, MD Inpatient   12/26/2012 11:54 PM 01/05/2013  3:34 PM Full Code [id number]   Ron Parker, MD ED       TOTAL TIME TAKING CARE OF THIS PATIENT: 55 minutes.    Auburn Bilberry M.D on 05/09/2017 at 8:33 PM  Between 7am to 6pm - Pager - [phone]  After 6pm go to [email or web address] - password [email or web address] [facility]  [facility]  Office  [phone]  CC: Primary care physician; Lauro Regulus, MD

## 2017-05-09 NOTE — ED Triage Notes (Signed)
Pt being treated for UTI with outpatient antibiotics. Pt c/o lower back pain, general pain. Pt alert and oriented X4, active, cooperative, pt in NAD. RR even and unlabored, color WNL.

## 2017-05-09 NOTE — ED Notes (Signed)
Pt transport to 123 

## 2017-05-09 NOTE — ED Provider Notes (Signed)
[facility] Emergency Department Provider Note  ____________________________________________   I have reviewed the triage vital signs and the nursing notes.   HISTORY  Chief Complaint Urinary Tract Infection   History limited by: Not Limited   HPI Michelle Cabrera is a 71 y.o. female who presents to the emergency department today because of concern for continued urinary tract infection.  DURATION:roughly 2 weeks TIMING: constant QUALITY: burning with urination CONTEXT: patient has been having UTI type symptoms for 2 weeks. Had macrobid initially prescribed by physician, switched to new, unclear medication roughly 1 week ago. Continues to have symptoms. MODIFYING FACTORS: antibiotics have not improved the symptoms ASSOCIATED SYMPTOMS: confusion, patient feels she had fever a couple of days.   Per medical record review patient has a history of frequent UTIs.  Past Medical History:  Diagnosis Date  . Anxiety   . Arthritis   . Cataract   . Cervicalgia   . Chronic lower back pain   . Closed compression fracture of L1 lumbar vertebra (HCC) 11/2012  . Collagen vascular disease (HCC)   . COPD (chronic obstructive pulmonary disease) (HCC)    unspecified  . DDD (degenerative disc disease)   . Depression   . Diabetes mellitus (HCC)   . Disorder of bursae and tendons in shoulder region    unspecified  . Fall 09/27/2016   Fall Feb 04, 2016 with broken vertebra  . GERD (gastroesophageal reflux disease)   . Headache   . Hereditary and idiopathic peripheral neuropathy    unspecified  . History of adenomatous polyp of colon    Followed by Dr. Markham Jordan  . History of tubal ligation   . Hypertension   . Intermediate coronary syndrome (HCC)   . Lumbago   . Neuralgia and neuritis, unspecified    radiculitis unspecified  . Rheumatoid arthritis (HCC)   . Rheumatoid arthritis (HCC)   . Senile osteoporosis   . Spinal stenosis, lumbar region without neurogenic  claudication     Patient Active Problem List   Diagnosis Date Noted  . Tachycardia 03/13/2017  . Protein calorie malnutrition (HCC) 03/13/2017  . Closed fracture of right olecranon process with routine healing 03/13/2017  . Hip fracture (HCC) 02/18/2017  . Bilateral carotid artery stenosis 12/10/2016  . Subclavian arterial stenosis (HCC) 12/10/2016  . Thoracic compression fracture (HCC) 02/04/2016  . UTI (lower urinary tract infection) 02/04/2016  . Schizophrenia spectrum disorder with psychotic disorder type not yet determined (HCC) 11/13/2015  . Syncope 11/10/2015  . Hypotension 11/10/2015  . 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 Pressure ulcer 09/08/2015  . New onset type 2 diabetes mellitus (HCC) 09/06/2015  . Altered mental status 09/06/2015  . COPD exacerbation (HCC) 07/26/2015  . Arthritis associated with another disorder 09/15/2014  . Wedge compression fracture of T6 vertebra (HCC) 02/07/2014  . Intractable low back pain 12/27/2012  . DDD (degenerative disc disease) 12/27/2012  . Rheumatoid arthritis (HCC) 12/27/2012  . Hypertension 12/27/2012    Past Surgical History:  Procedure Laterality Date  . BACK SURGERY    . BREAST BIOPSY Left    negative  . CATARACT EXTRACTION W/ INTRAOCULAR LENS  IMPLANT, BILATERAL Bilateral   . COLONOSCOPY  08/17/2007   Adenomatous Polyps  . COLONOSCOPY  10/09/2010   PH Adenomatous polyps : [facility] 09/2015 ; Recall Ltr mailed 08/17/2015 (dw)  . ESOPHAGOGASTRODUODENOSCOPY  01/14/2008   No repeat per RTE  . FINGER ARTHROPLASTY Right 09/15/2014   Procedure: RIGHT MIDDLE FINGER, RING FINGER, SMALL FINGER EXTENSOR  DIGITORUM COMMUMIS STABILIZATION WITH RIGHT MIDDLE FINGER MCP ARTHROPLASTY, POSSIBLE RING FINGER AND SMALL FINGER MCP ARTHROPLASTY;  Surgeon: Dominica Severin, MD;  Location: [facility] OR;  Service: Orthopedics;  Laterality: Right;  . FOOT SURGERY Bilateral   . HAND TENDON SURGERY Right 09/15/2014   "d/t RA"  . HARDWARE REMOVAL Right 04/16/2017    Procedure: HARDWARE REMOVAL;  Surgeon: Donato Heinz, MD;  Location: [facility] ORS;  Service: Orthopedics;  Laterality: Right;  . HUMERUS FRACTURE SURGERY Left   . INCISION AND DRAINAGE OF WOUND Right 04/16/2017   Procedure: IRRIGATION AND DEBRIDEMENT WOUND;  Surgeon: Peggye Form, DO;  Location: [facility] ORS;  Service: Plastics;  Laterality: Right;  . INTRAMEDULLARY (IM) NAIL INTERTROCHANTERIC Right 02/19/2017   Procedure: INTRAMEDULLARY (IM) NAIL INTERTROCHANTRIC;  Surgeon: Donato Heinz, MD;  Location: [facility] ORS;  Service: Orthopedics;  Laterality: Right;  . KYPHOPLASTY N/A 01/04/2013   Procedure: L1 Kyphoplasty;  Surgeon: Hewitt Shorts, MD;  Location: [facility] NEURO [facility];  Service: Neurosurgery;  Laterality: N/A;  L1 Kyphoplasty  . KYPHOPLASTY N/A 02/07/2014   Procedure: THORACIC SIX KYPHOPLASTY;  Surgeon: Hewitt Shorts, MD;  Location: [facility];  Service: Neurosurgery;  Laterality: N/A;  T6 Kyphoplasty   . KYPHOPLASTY N/A 02/08/2016   Procedure: KYPHOPLASTY  T-12;  Surgeon: Kennedy Bucker, MD;  Location: [facility] ORS;  Service: Orthopedics;  Laterality: N/A;  . KYPHOPLASTY N/A 10/01/2016   Procedure: KYPHOPLASTY  L-5;  Surgeon: Kennedy Bucker, MD;  Location: [facility] ORS;  Service: Orthopedics;  Laterality: N/A;  . LUMBAR DISC SURGERY  2014    X 3  . LUMBAR LAMINECTOMY    . ORIF ELBOW FRACTURE Right 02/19/2017   Procedure: OPEN REDUCTION INTERNAL FIXATION (ORIF) ELBOW/OLECRANON FRACTURE;  Surgeon: Donato Heinz, MD;  Location: [facility] ORS;  Service: Orthopedics;  Laterality: Right;  . PERIPHERAL VASCULAR CATHETERIZATION Left 12/07/2015   Procedure: Upper Extremity Angiography;  Surgeon: Annice Needy, MD;  Location: [facility] INVASIVE CV LAB;  Service: Cardiovascular;  Laterality: Left;  . PERIPHERAL VASCULAR CATHETERIZATION  12/07/2015   Procedure: Upper Extremity Intervention;  Surgeon: Annice Needy, MD;  Location: [facility] IN[facility] CV [facility];  Service: Cardiovascular;;  . POSTERIOR LUMBAR FUSION     "got screws in"   . TONSILLECTOMY    . TUBAL LIGATION    . WRIST FUSION Right    30 yrs ago    Prior to Admission medications   Medication Sig Start Date End Date Taking? Authorizing Provider  acetaminophen (TYLENOL) 500 MG tablet Take 1,000 mg by mouth daily as needed for mild pain or headache.     [provider]  albuterol (PROVENTIL HFA;VENTOLIN HFA) 108 (90 Base) MCG/ACT inhaler Inhale 2 puffs into the lungs every 6 (six) hours as needed for wheezing or shortness of breath.    [provider]  aspirin EC 81 MG tablet Take 81 mg by mouth daily.    [provider]  bisacodyl (DULCOLAX) 10 MG suppository Place 1 suppository (10 mg total) rectally daily as needed for moderate constipation. 02/21/17   Altamese Dilling, MD  Calcium Carbonate-Vitamin D 600-400 MG-UNIT tablet Take 1 tablet by mouth daily.     [provider]  Cholecalciferol 4000 units CAPS Take 4,000 Units by mouth daily.     [provider]  DULoxetine (CYMBALTA) 30 MG capsule Take 30 mg by mouth at bedtime. *Note dose*    [provider]  DULoxetine (CYMBALTA) 60 MG capsule Take 60 mg by mouth every morning. *Note  dose*    [provider]  enoxaparin (LOVENOX) 40 MG/0.4ML injection Inject 0.4 mLs (40 mg total) into the skin daily. 02/22/17 03/08/17  Altamese Dilling, MD  etanercept (ENBREL SURECLICK) 50 MG/ML injection Inject 50 mg into the skin once a week. Fridays    [provider]  ferrous sulfate 325 (65 FE) MG tablet Take 1 tablet (325 mg total) by mouth 2 (two) times daily with a meal. 02/21/17   Altamese Dilling, MD  Fluticasone Furoate-Vilanterol (BREO ELLIPTA) 100-25 MCG/INH AEPB Inhale 1 puff into the lungs every morning.    [provider]  folic acid (FOLVITE) 1 MG tablet Take 1 mg by mouth daily.    [provider]  gabapentin (NEURONTIN) 300 MG capsule Take 600 mg by mouth 2 (two) times daily as needed (restless legs).      [provider]  HYDROcodone-acetaminophen (NORCO) 5-325 MG tablet Take 1 tablet by mouth every 4 (four) hours as needed for moderate pain. 04/16/17   Hooten, Illene Labrador, MD  ibuprofen (ADVIL,MOTRIN) 200 MG tablet Take 400 mg by mouth every 8 (eight) hours as needed for mild pain. Reported on 12/07/2015    [provider]  METHOTREXATE SODIUM, PF, IJ Inject 0.8 mLs into the skin once a week. Methotrexate sodium solution (25 mg/ ml ) Give 0.8 ml on Friday.    [provider]  metoprolol tartrate (LOPRESSOR) 25 MG tablet Take 25 mg by mouth 2 (two) times daily.    [provider]  Nutritional Supplements (ENSURE ENLIVE PO) Take 1 Bottle by mouth 2 (two) times daily between meals.    [provider]  omeprazole (PRILOSEC) 20 MG capsule Take 20 mg by mouth 2 (two) times daily.     [provider]  oxymorphone (OPANA) 5 MG tablet Take 1 tablet (5 mg total) by mouth every 4 (four) hours as needed for pain. 03/06/17   Lorenso Quarry, NP  polyethylene glycol (MIRALAX / GLYCOLAX) packet Take 17 g by mouth daily as needed for mild constipation.     [provider]  QUEtiapine (SEROQUEL) 100 MG tablet Take 1.5 tablets (150 mg total) by mouth at bedtime. 11/11/15   Ramonita Lab, MD  senna (SENOKOT) 8.6 MG tablet Take 1 tablet by mouth daily as needed for constipation.     [provider]  sulfamethoxazole-trimethoprim (BACTRIM DS,SEPTRA DS) 800-160 MG tablet Take 1 tablet by mouth 2 (two) times daily.    [provider]  trolamine salicylate (ASPERCREME) 10 % cream Apply 1 application topically 2 (two) times daily as needed for muscle pain.    [provider]  vitamin C (ASCORBIC ACID) 500 MG tablet Take 500 mg by mouth daily.    [provider]    Allergies Ambien [zolpidem tartrate]; Dilaudid [hydromorphone hcl]; Gold-containing drug products; Adhesive [tape]; and Haldol [haloperidol]  Family History  Problem  Relation Age of Onset  . CAD Father   . Alcohol abuse Father   . Heart disease Father   . CAD Mother   . Hypertension Mother   . Peripheral vascular disease Mother   . Osteoarthritis Mother   . Heart disease Mother   . Breast cancer Maternal Aunt 50  . Alcohol abuse Sister   . Arthritis Sister   . Cancer Sister     Social History Social History  Substance Use Topics  . Smoking status: Former Smoker    Packs/day: 1.00    Years: 56.00    Types: Cigarettes  Quit date: 07/25/2015  . Smokeless tobacco: Never Used  . Alcohol use No    Review of Systems Constitutional: No fever/chills Eyes: No visual changes. ENT: No sore throat. Cardiovascular: Denies chest pain. Respiratory: Denies shortness of breath. Gastrointestinal: Positive for lower abdominal pain.  Genitourinary:  Positive for dysuria.  Musculoskeletal: Negative for back pain. Skin: Negative for rash. Neurological: Positive for confusion.  ____________________________________________   PHYSICAL EXAM:  VITAL SIGNS: ED Triage Vitals [05/09/17 1516]  Enc Vitals Group     BP 128/82     Pulse Rate (!) 104     Resp 18     Temp 99.2 F (37.3 C)     Temp Source Oral     SpO2 98 %     Weight 125 lb (56.7 kg)     Height  (1.575 m)     Head Circumference      Peak Flow      Pain Score 8   Constitutional: Alert and oriented. Well appearing and in no distress. Eyes: Conjunctivae are normal.  ENT   Head: Normocephalic and atraumatic.   Nose: No congestion/rhinnorhea.   Mouth/Throat: Mucous membranes are moist.   Neck: No stridor. Hematological/Lymphatic/Immunilogical: No cervical lymphadenopathy. Cardiovascular: Normal rate, regular rhythm.  No murmurs, rubs, or gallops.  Respiratory: Normal respiratory effort without tachypnea nor retractions. Breath sounds are clear and equal bilaterally. No wheezes/rales/rhonchi. Gastrointestinal: Soft and non tender. No rebound. No guarding.   Genitourinary: Deferred Musculoskeletal: Normal range of motion in all extremities. No lower extremity edema. Neurologic:  Normal speech and language. No gross focal neurologic deficits are appreciated.  Skin:  Skin is warm, dry and intact. No rash noted. Psychiatric: Mood and affect are normal. Speech and behavior are normal. Patient exhibits appropriate insight and judgment.  ____________________________________________    LABS (pertinent positives/negatives)  Lactic acid 1.6 CBC WBC 13.5 CMP na 133, k 3.3, ch 96, glu 140, cr 0.88 UA WBC too numerous to coung, leuk trace  ____________________________________________   EKG  None  ____________________________________________    RADIOLOGY  None   ____________________________________________   PROCEDURES  Procedures  ____________________________________________   INITIAL IMPRESSION / ASSESSMENT AND PLAN / ED COURSE  Pertinent labs & imaging results that were available during my care of the patient were reviewed by me and considered in my medical decision making (see chart for details).  Patient presents with concern for continued uti some confusion. Patient has been on outpatient abx. ddx would include resistant strain, other infection, electrolyte abnormality among other etiologies of the patient's symptoms. Urine is concerning for continued infection. Blood work shows an elevated wbc. Lactic acid within normal limits. Will plan on initiating antibiotics in the ed and admission to the hospitalist service. Discussed results and plan with patient and family.   ____________________________________________   FINAL CLINICAL IMPRESSION(S) / ED DIAGNOSES  Final diagnoses:  Lower urinary tract infectious disease  Leukocytosis, unspecified type     Note: This dictation was prepared with Dragon dictation. Any transcriptional errors that result from this process are unintentional     Phineas Semen,  MD 05/09/17 1944

## 2017-05-10 LAB — CBC
HCT: 30.1 % — ABNORMAL LOW (ref 35.0–47.0)
Hemoglobin: 10.1 g/dL — ABNORMAL LOW (ref 12.0–16.0)
MCH: 29.4 pg (ref 26.0–34.0)
MCHC: 33.5 g/dL (ref 32.0–36.0)
MCV: 87.8 fL (ref 80.0–100.0)
Platelets: 604 10*3/uL — ABNORMAL HIGH (ref 150–440)
RBC: 3.43 MIL/uL — ABNORMAL LOW (ref 3.80–5.20)
RDW: 17.3 % — ABNORMAL HIGH (ref 11.5–14.5)
WBC: 9.4 10*3/uL (ref 3.6–11.0)

## 2017-05-10 LAB — BASIC METABOLIC PANEL
ANION GAP: 9 (ref 5–15)
BUN: 16 mg/dL (ref 6–20)
CHLORIDE: 102 mmol/L (ref 101–111)
CO2: 26 mmol/L (ref 22–32)
CREATININE: 0.69 mg/dL (ref 0.44–1.00)
Calcium: 8 mg/dL — ABNORMAL LOW (ref 8.9–10.3)
GFR calc non Af Amer: >60 mL/min (ref >60)
Glucose, Bld: 101 mg/dL — ABNORMAL HIGH (ref 65–99)
POTASSIUM: 2.8 mmol/L — AB (ref 3.5–5.1)
SODIUM: 137 mmol/L (ref 135–145)

## 2017-05-10 LAB — MAGNESIUM: MAGNESIUM: 1.7 mg/dL (ref 1.7–2.4)

## 2017-05-10 MED ORDER — POTASSIUM CHLORIDE 10 MEQ/100ML IV SOLN
10.0000 meq | INTRAVENOUS | Status: AC
  Administered 2017-05-10 (×3): 10 meq via INTRAVENOUS
  Filled 2017-05-10 (×3): qty 100

## 2017-05-10 MED ORDER — POTASSIUM CHLORIDE CRYS ER 20 MEQ PO TBCR
40.0000 meq | EXTENDED_RELEASE_TABLET | Freq: Once | ORAL | Status: AC
  Administered 2017-05-10: 40 meq via ORAL
  Filled 2017-05-10: qty 2

## 2017-05-10 MED ORDER — MAGNESIUM SULFATE 2 GM/50ML IV SOLN
2.0000 g | Freq: Once | INTRAVENOUS | Status: AC
  Administered 2017-05-10: 12:00:00 2 g via INTRAVENOUS
  Filled 2017-05-10: qty 50

## 2017-05-10 MED ORDER — PREDNISONE 20 MG PO TABS
20.0000 mg | ORAL_TABLET | Freq: Every day | ORAL | Status: DC
  Administered 2017-05-10 – 2017-05-11 (×2): 20 mg via ORAL
  Filled 2017-05-10 (×2): qty 1

## 2017-05-10 MED ORDER — POTASSIUM CHLORIDE CRYS ER 20 MEQ PO TBCR
20.0000 meq | EXTENDED_RELEASE_TABLET | Freq: Two times a day (BID) | ORAL | Status: DC
  Administered 2017-05-10: 20 meq via ORAL
  Filled 2017-05-10: qty 1

## 2017-05-10 MED ORDER — PREDNISONE 20 MG PO TABS
20.0000 mg | ORAL_TABLET | Freq: Every day | ORAL | Status: DC
Start: 2017-05-11 — End: 2017-05-10

## 2017-05-10 NOTE — Plan of Care (Signed)
Problem: Activity: Goal: Risk for activity intolerance will decrease Outcome: Progressing Pt was able to sit on the [facility]

## 2017-05-10 NOTE — Evaluation (Signed)
Occupational Therapy Evaluation Patient Details Name: Michelle Cabrera MRN: [id number] DOB: October 04, 1945 Today's Date: 05/10/2017    History of Present Illness Pt. is a 71 y.o female who was admitted to [facility] with UTI, and confusion. Pt. was recently admitted for a decubitus ulcer over olecranon with exposure of orthopedic hardware treatment of right arm wound.  pt. PMHx includes: Anxiety, chronic low back pain, CPD, Depression, wound vac in Right UE, cervicalgia, Collagen vascular disorder, COPD, DDD, Depression, DM, RA, and Schizophrenia Spectrum Disorder   Clinical Impression   Pt. was seen for at bedside for assessment. Pt. presents with confusion, weakness, and pain. Pt. family assisted pt. with the functional history, and home environment interview. Pt. resides at home with her husband, Pt. husband assisted pt. with ADLs, IADLs, medication management, and meal preparation. Pt. Was recently in STR at SNF, and had returned home. Pt. was receiving home health therapy, and nursing for wound care.  Pt. family reports pt. has chronic pain.  Pt. Reports 8/10 pain in her back. Pt. And family education was provided about positioning for comfort, and positioning of BUEs. Pt. Could benefit from skilled OT services for ADL training, positioning, and pt/family education. Pt. Could benefit from STR at SNF level of care with follow-up OT services.    Follow Up Recommendations  SNF    Equipment Recommendations       Recommendations for Other Services       Precautions / Restrictions Restrictions Weight Bearing Restrictions: No                                                 ADL either performed or assessed with clinical judgement   ADL Overall ADL's : Needs assistance/impaired Eating/Feeding: Maximal assistance;Bed level   Grooming: Maximal assistance;Bed level   Upper Body Bathing: Total assistance;Bed level   Lower Body Bathing: Total assistance;Bed level   Upper Body  Dressing : Total assistance;Bed level   Lower Body Dressing: Total assistance;Bed level                 General ADL Comments: Pt. was assisted with repostioning.     Vision         Perception     Praxis      Pertinent Vitals/Pain       Hand Dominance Right   Extremity/Trunk Assessment Upper Extremity Assessment Upper Extremity Assessment: Generalized weakness (Would vac in RUE. Pt. familiy reports LUE is weaker than it has been.)           Communication Communication Communication: No difficulties   Cognition Arousal/Alertness: Awake/alert Behavior During Therapy: WFL for tasks assessed/performed Overall Cognitive Status: Impaired/Different from baseline                                     General Comments       Exercises     Shoulder Instructions      Home Living Family/patient expects to be discharged to:: Private residence Living Arrangements: Spouse/significant other Available Help at Discharge: Family;Available 24 hours/day Type of Home: House Home Access: Ramped entrance     Home Layout: One level     Bathroom Shower/Tub: Walk-in shower;Door   [employer] Toilet: Standard     Home Equipment: Shower seat;Cane - single point;Bedside commode;Walker -  4 wheels          Prior Functioning/Environment Level of Independence: Needs assistance    ADL's / Homemaking Assistance Needed: Pt. husband assist pt. with ADLs, morning care, medication management, meal preparation            OT Problem List: Decreased strength;Pain;Impaired balance (sitting and/or standing);Decreased knowledge of use of DME or AE;Decreased cognition;Decreased activity tolerance;Decreased range of motion;Impaired UE functional use      OT Treatment/Interventions: Self-care/ADL training;Therapeutic exercise;Therapeutic activities;Cognitive remediation/compensation;DME and/or AE instruction    OT Goals(Current goals can be found in the care plan  section)    OT Frequency: Min 1X/week   Barriers to D/C:            Co-evaluation              AM-PAC PT "6 Clicks" Daily Activity     Outcome Measure Help from another person eating meals?: A Lot Help from another person taking care of personal grooming?: Total Help from another person toileting, which includes using toliet, bedpan, or urinal?: Total Help from another person bathing (including washing, rinsing, drying)?: Total Help from another person to put on and taking off regular upper body clothing?: Total Help from another person to put on and taking off regular lower body clothing?: Total 6 Click Score: 7   End of Session    Activity Tolerance: Patient tolerated treatment well Patient left: in bed;with call bell/phone within reach;with nursing/sitter in room;with bed alarm set;with family/visitor present  OT Visit Diagnosis: Muscle weakness (generalized) (M62.81)                Time: 1110-1130 OT Time Calculation (min): 20 min Charges:  OT General Charges $OT Visit: 1 Visit OT Evaluation $OT Eval Low Complexity: 1 Low G-Codes: OT G-codes **NOT FOR INPATIENT CLASS** Functional Limitation: Self care Self Care Current Status (X1062): At least 80 percent but less than 100 percent impaired, limited or restricted Self Care Goal Status (I9485): At least 40 percent but less than 60 percent impaired, limited or restricted   [name], MS, OTR/L   [name], MS, OTR/L 05/10/2017, 12:29 PM

## 2017-05-10 NOTE — Progress Notes (Signed)
Patient ID: Michelle Cabrera, female   DOB: [birth year], 71 y.o.   MRN: [id number]  [employer] PROGRESS NOTE  IRLANDA CROGHAN LTJ:[id number] DOB: 1945/12/12 DOA: 05/09/2017 PCP: Lauro Regulus, MD  HPI/Subjective: Patient brought in with altered mental status and weakness. She was battling a urinary tract infection as outpatient.  Objective: Vitals:   05/10/17 0934 05/10/17 1247  BP: 140/64 118/60  Pulse: 98 71  Resp:    Temp:  98.2 F (36.8 C)  SpO2:  95%    Filed Weights   05/09/17 1516  Weight: 56.7 kg (125 lb)    ROS: Review of Systems  Constitutional: Negative for chills and fever.  Eyes: Negative for blurred vision.  Respiratory: Negative for cough and shortness of breath.   Cardiovascular: Negative for chest pain.  Gastrointestinal: Positive for abdominal pain. Negative for constipation, diarrhea, nausea and vomiting.  Genitourinary: Negative for dysuria.  Musculoskeletal: Negative for joint pain.  Neurological: Negative for dizziness and headaches.   Exam: Physical Exam  HENT:  Nose: No mucosal edema.  Mouth/Throat: No oropharyngeal exudate or posterior oropharyngeal edema.  Eyes: Pupils are equal, round, and reactive to light. Conjunctivae, EOM and lids are normal.  Neck: No JVD present. Carotid bruit is not present. No edema present. No thyroid mass and no thyromegaly present.  Cardiovascular: S1 normal and S2 normal.  Exam reveals no gallop.   No murmur heard. Pulses:      Dorsalis pedis pulses are 2+ on the right side, and 2+ on the left side.  Respiratory: No respiratory distress. She has no wheezes. She has no rhonchi. She has no rales.  GI: Soft. Bowel sounds are normal. There is no tenderness.  Musculoskeletal:       Right ankle: She exhibits no swelling.       Left ankle: She exhibits no swelling.  Lymphadenopathy:    She has no cervical adenopathy.  Neurological: She is alert. No cranial nerve deficit.  Left shoulder pain and weakness  with movement. Patient weak with bilateral straight leg raise  Skin: Skin is warm. Nails show no clubbing.  Nonhealing ulcer right elbow.  Psychiatric: She has a normal mood and affect.      Data Reviewed: Basic Metabolic Panel:  Recent Labs Lab 05/09/17 1518 05/10/17 0426  NA 133* 137  K 3.3* 2.8*  CL 96* 102  CO2 23 26  GLUCOSE 140* 101*  BUN 18 16  CREATININE 0.88 0.69  CALCIUM 8.9 8.0*  MG  --  1.7   Liver Function Tests:  Recent Labs Lab 05/09/17 1518  AST 31  ALT 11*  ALKPHOS 145*  BILITOT 0.4  PROT 8.3*  ALBUMIN 2.7*   CBC:  Recent Labs Lab 05/09/17 1518 05/10/17 0426  WBC 13.5* 9.4  NEUTROABS 9.5*  --   HGB 12.1 10.1*  HCT 36.8 30.1*  MCV 87.6 87.8  PLT 804* 604*    Scheduled Meds: . aspirin EC  81 mg Oral Daily  . calcium-vitamin D  2 tablet Oral Q breakfast  . cholecalciferol  4,000 Units Oral Daily  . DULoxetine  30 mg Oral QHS  . DULoxetine  60 mg Oral BH-q7a  . enoxaparin (LOVENOX) injection  40 mg Subcutaneous Q24H  . ferrous sulfate  325 mg Oral BID WC  . fluticasone furoate-vilanterol  1 puff Inhalation q morning - 10a  . folic acid  1 mg Oral Daily  . Influenza vac split quadrivalent PF  0.5 mL Intramuscular Tomorrow-1000  .  metoprolol tartrate  25 mg Oral BID  . pantoprazole  40 mg Oral Daily  . potassium chloride  20 mEq Oral BID  . QUEtiapine  150 mg Oral QHS  . sodium chloride flush  3 mL Intravenous Q12H  . vitamin C  500 mg Oral Daily   Continuous Infusions: . sodium chloride    . cefTRIAXone (ROCEPHIN)  IV Stopped (05/09/17 2130)  . potassium chloride 10 mEq (05/10/17 1527)    Assessment/Plan:  1. Acute encephalopathy. Likely secondary to urinary tract infection. 2. Hypokalemia and hypomagnesemia. Replace magnesium IV. Replace potassium IV and orally and recheck collection lites tomorrow morning. 3. Partially treated Serratia UTI. Rocephin will cover. 4. Neck pain and left shoulder pain. Family wanted to hold off  on imaging and medications such as steroids or muscle relaxants at this time. Patient has a history of rheumatoid arthritis. 5. Weakness. Physical therapy evaluation 6. Depression continue psychiatric medications 7. Nonhealing ulcer on the right arm on a wound VAC. Follows with surgery as outpatient.  Code Status:     Code Status Orders        Start     Ordered   05/09/17 2216  Full code  Continuous     05/09/17 2215    Code Status History    Date Active Date Inactive Code Status Order ID Comments User Context   02/18/2017  5:30 AM 02/22/2017  3:44 PM Full Code [id number]  Ihor Austin, MD Inpatient   10/01/2016  1:20 PM 10/01/2016  6:20 PM Full Code [id number]  Kennedy Bucker, MD Inpatient   02/04/2016  4:31 PM 02/09/2016  5:14 PM Full Code [id number]  Marguarite Arbour, MD Inpatient   11/10/2015  4:58 AM 11/11/2015  6:48 PM Full Code [id number]  Enedina Finner, MD Inpatient   09/06/2015  4:04 AM 09/08/2015  8:04 PM Full Code [id number]  Ihor Austin, MD ED   07/26/2015  3:51 PM 08/01/2015  4:32 PM Full Code [id number]  Shaune Pollack, MD Inpatient   09/15/2014  6:36 PM 09/16/2014  5:22 PM Full Code [id number]  Dominica Severin, MD Inpatient   02/07/2014 11:04 AM 02/07/2014 10:39 PM Full Code [id number]  Hewitt Shorts, MD Inpatient   12/26/2012 11:54 PM 01/05/2013  3:34 PM Full Code [id number]  Ron Parker, MD ED     Family Communication: daughter at the bedside Disposition Plan: to be determined based on physical therapy evaluation  Antibiotics:  Rocephin  Time spent: 28 minutes  Alford Highland  [employer]

## 2017-05-10 NOTE — Plan of Care (Signed)
Problem: Pain Managment: Goal: General experience of comfort will improve Outcome: Progressing Pt pain has been controled.

## 2017-05-10 NOTE — Plan of Care (Signed)
Problem: Safety: Goal: Ability to remain free from injury will improve Outcome: Progressing Pt remained free of fall.

## 2017-05-11 LAB — BASIC METABOLIC PANEL
Anion gap: 10 (ref 5–15)
BUN: 19 mg/dL (ref 6–20)
CALCIUM: 9.1 mg/dL (ref 8.9–10.3)
CHLORIDE: 98 mmol/L — AB (ref 101–111)
CO2: 25 mmol/L (ref 22–32)
CREATININE: 0.69 mg/dL (ref 0.44–1.00)
GFR calc non Af Amer: >60 mL/min (ref >60)
Glucose, Bld: 178 mg/dL — ABNORMAL HIGH (ref 65–99)
Potassium: 5 mmol/L (ref 3.5–5.1)
SODIUM: 133 mmol/L — AB (ref 135–145)

## 2017-05-11 LAB — URINE CULTURE: Culture: <10000 — AB

## 2017-05-11 LAB — MAGNESIUM: MAGNESIUM: 2 mg/dL (ref 1.7–2.4)

## 2017-05-11 MED ORDER — MORPHINE SULFATE 15 MG PO TABS: 15.0000 mg | ORAL_TABLET | Freq: Three times a day (TID) | ORAL | Status: DC | PRN

## 2017-05-11 MED ORDER — HYDROCODONE-ACETAMINOPHEN 5-325 MG PO TABS
1.0000 | ORAL_TABLET | Freq: Four times a day (QID) | ORAL | Status: DC | PRN
  Administered 2017-05-11 – 2017-05-15 (×4): 1 via ORAL
  Filled 2017-05-11 (×4): qty 1

## 2017-05-11 MED ORDER — QUETIAPINE FUMARATE 25 MG PO TABS
25.0000 mg | ORAL_TABLET | Freq: Every day | ORAL | Status: DC
  Administered 2017-05-11 – 2017-05-15 (×5): 25 mg via ORAL
  Filled 2017-05-11 (×5): qty 1

## 2017-05-11 MED ORDER — ENSURE ENLIVE PO LIQD
237.0000 mL | Freq: Three times a day (TID) | ORAL | Status: DC
  Administered 2017-05-11 – 2017-05-16 (×13): 237 mL via ORAL

## 2017-05-11 MED ORDER — ADULT MULTIVITAMIN W/MINERALS CH
1.0000 | ORAL_TABLET | Freq: Every day | ORAL | Status: DC
  Administered 2017-05-12 – 2017-05-16 (×5): 1 via ORAL
  Filled 2017-05-11 (×5): qty 1

## 2017-05-11 MED ORDER — SODIUM CHLORIDE 0.9 % IV SOLN
INTRAVENOUS | Status: DC
  Administered 2017-05-11 – 2017-05-12 (×2): via INTRAVENOUS

## 2017-05-11 NOTE — Progress Notes (Signed)
PT Cancellation Note  Patient Details Name: Michelle Cabrera MRN: [id number] DOB: 1946-04-15   Cancelled Treatment:    Reason Eval/Treat Not Completed: Patient's level of consciousness  Upon entering the room pt was laying in bed with her husband present. Attempted evaluation but pt was unable to stay awake and kept falling asleep requiring multiple attempts to keep pt awake.   Christino Mcglinchey PT, DPT, LAT, ATC  05/11/17  10:44 AM      Michelle Cabrera 05/11/2017, 10:41 AM

## 2017-05-11 NOTE — Progress Notes (Signed)
Patient ID: [name], female   DOB: Feb 10, 1946, 71 y.o.   MRN: [id number]   [facility] PROGRESS NOTE  Michelle Cabrera [id number] DOB: [birth year] DOA: 05/09/2017 PCP: Lauro Regulus, MD  HPI/Subjective: Patient was seen this morning and she was very lethargic.  She was able to answer a few questions but then fell back asleep.  Objective: Vitals:   05/11/17 1106 05/11/17 1510  BP: 132/80 110/61  Pulse: 71 73  Resp:  18  Temp:  98.3 F (36.8 C)  SpO2:  94%    Filed Weights   05/09/17 1516  Weight: 56.7 kg (125 lb)    ROS: Review of Systems  Unable to perform ROS: Acuity of condition  Respiratory: Negative for shortness of breath.   Cardiovascular: Negative for chest pain.  Musculoskeletal: Negative for joint pain and neck pain.   Exam: Physical Exam  Constitutional: She appears lethargic.  HENT:  Nose: No mucosal edema.  Mouth/Throat: No oropharyngeal exudate or posterior oropharyngeal edema.  Eyes: Pupils are equal, round, and reactive to light. Conjunctivae, EOM and lids are normal.  Neck: No JVD present. Carotid bruit is not present. No edema present. No thyroid mass and no thyromegaly present.  Cardiovascular: S1 normal and S2 normal.  Exam reveals no gallop.   No murmur heard. Pulses:      Dorsalis pedis pulses are 2+ on the right side, and 2+ on the left side.  Respiratory: No respiratory distress. She has no wheezes. She has no rhonchi. She has no rales.  GI: Soft. Bowel sounds are normal. There is no tenderness.  Musculoskeletal:       Right ankle: She exhibits no swelling.       Left ankle: She exhibits no swelling.  Lymphadenopathy:    She has no cervical adenopathy.  Neurological: She appears lethargic. No cranial nerve deficit.  Patient able to lift up her left arm  Skin: Skin is warm. Nails show no clubbing.  Nonhealing ulcer right elbow covered with wound VAC  Psychiatric:  lethargic      Data Reviewed: Basic Metabolic  Panel:  Recent Labs Lab 05/09/17 1518 05/10/17 0426 05/11/17 0527  NA 133* 137 133*  K 3.3* 2.8* 5.0  CL 96* 102 98*  CO2 23 26 25   GLUCOSE 140* 101* 178*  BUN 18 16 19   CREATININE 0.88 0.69 0.69  CALCIUM 8.9 8.0* 9.1  MG  --  1.7 2.0   Liver Function Tests:  Recent Labs Lab 05/09/17 1518  AST 31  ALT 11*  ALKPHOS 145*  BILITOT 0.4  PROT 8.3*  ALBUMIN 2.7*   CBC:  Recent Labs Lab 05/09/17 1518 05/10/17 0426  WBC 13.5* 9.4  NEUTROABS 9.5*  --   HGB 12.1 10.1*  HCT 36.8 30.1*  MCV 87.6 87.8  PLT 804* 604*    Scheduled Meds: . aspirin EC  81 mg Oral Daily  . calcium-vitamin D  2 tablet Oral Q breakfast  . cholecalciferol  4,000 Units Oral Daily  . DULoxetine  30 mg Oral QHS  . DULoxetine  60 mg Oral BH-q7a  . enoxaparin (LOVENOX) injection  40 mg Subcutaneous Q24H  . ferrous sulfate  325 mg Oral BID WC  . fluticasone furoate-vilanterol  1 puff Inhalation q morning - 10a  . folic acid  1 mg Oral Daily  . Influenza vac split quadrivalent PF  0.5 mL Intramuscular Tomorrow-1000  . metoprolol tartrate  25 mg Oral BID  . pantoprazole  40 mg  Oral Daily  . QUEtiapine  25 mg Oral QHS  . sodium chloride flush  3 mL Intravenous Q12H  . vitamin C  500 mg Oral Daily   Continuous Infusions: . sodium chloride    . sodium chloride 40 mL/hr at 05/11/17 1108  . cefTRIAXone (ROCEPHIN)  IV Stopped (05/10/17 2139)    Assessment/Plan:  1. Acute encephalopathy. Likely secondary to urinary tract infection and medications. Decrease dose of Seroquel down to 25 mg daily at bedtime. Decrease frequency of pain medications. Stop prednisone. 2. Hypokalemia and hypomagnesemia. Electrolytes replaced yesterday. 3. Partially treated Serratia UTI. Rocephin will cover.  Our urine culture here is negative. 4. Neck pain and left shoulder pain. Family wanted to hold off on imaging. Patient's pain is better today. Not sure if I'm getting an accurate assessment with her altered mental  status. Patient had prednisone yesterday and today. 5. Weakness. Physical therapy evaluation. 6. Depression continue psychiatric medications. 7. Nonhealing ulcer on the right arm on a wound VAC. Follows with surgery as outpatient.  Code Status:     Code Status Orders        Start     Ordered   05/09/17 2216  Full code  Continuous     05/09/17 2215    Code Status History    Date Active Date Inactive Code Status Order ID Comments User Context   02/18/2017  5:30 AM 02/22/2017  3:44 PM Full Code [id number]  Ihor Austin, MD Inpatient   10/01/2016  1:20 PM 10/01/2016  6:20 PM Full Code [id number]  Kennedy Bucker, MD Inpatient   02/04/2016  4:31 PM 02/09/2016  5:14 PM Full Code [id number]  Marguarite Arbour, MD Inpatient   11/10/2015  4:58 AM 11/11/2015  6:48 PM Full Code [id number]  Enedina Finner, MD Inpatient   09/06/2015  4:04 AM 09/08/2015  8:04 PM Full Code [id number]  Ihor Austin, MD ED   07/26/2015  3:51 PM 08/01/2015  4:32 PM Full Code [id number]  Shaune Pollack, MD Inpatient   09/15/2014  6:36 PM 09/16/2014  5:22 PM Full Code [id number]  Dominica Severin, MD Inpatient   02/07/2014 11:04 AM 02/07/2014 10:39 PM Full Code [id number]  Hewitt Shorts, MD Inpatient   12/26/2012 11:54 PM 01/05/2013  3:34 PM Full Code [id number]  Ron Parker, MD ED     Family Communication: husband at the bedside Disposition Plan: to be determined based on physical therapy evaluation  Antibiotics:  Rocephin  Time spent: 27 minutes  Alford Highland  [facility]

## 2017-05-11 NOTE — Progress Notes (Signed)
Initial Nutrition Assessment  DOCUMENTATION CODES:   Severe malnutrition in context of chronic illness  INTERVENTION:  Provide Ensure Enlive po TID, each supplement provides 350 kcal and 20 grams of protein.  Recommend daily multivitamin with minerals.  Recommend increasing vitamin C dose to 500 mg BID to help promote wound healing.  NUTRITION DIAGNOSIS:   Malnutrition (Severe) related to chronic illness (non-healing wound on right elbow with wound VAC, fall July 2018) as evidenced by severe depletion of body fat, severe depletion of muscle mass, 9.9 percent weight loss over 2 months.  GOAL:   Patient will meet greater than or equal to 90% of their needs  MONITOR:   PO intake, Supplement acceptance, Labs, Weight trends, Skin, I & O's  REASON FOR ASSESSMENT:   Malnutrition Screening Tool    ASSESSMENT:   71 year old female with PMHx of HTN, COPD, anxiety, depression, GERD, RA, DDD, DM type 2, OP, who is admitted with acute encephalopathy secondary to UTI and medications, hypokalemia, hypomagnesemia, neck pain and left shoulder pain, nonhealing ulcer to right arm on wound VAC.   Met with patient and her daughter at bedside. Patient not a great historian but daughter able to provide history. Patient has had a poor appetite for the past month. She had a fall in July and broke her elbow and hip. After surgery her wound on her elbow never healed. Patient's husband prepares food for her at home. She may eat a cheeseburger, chicken salad, or egg salad. She has been drinking Ensure, but daughter is unsure of which type. PO intake here has been variable (0-100%).  UBW 135-140 lbs. Patient was 138.7 lbs on 02/26/2017. She has lost 13.7 lbs (9.9% body weight) over 2 months, which is significant for time frame.  Medications reviewed and include: Oscal with D 2 tablets daily, Vitamin D 4000 units daily, ferrous sulfate 093 mg BID, folic acid 1 mg daily, pantoprazole, vitamin C 500 mg daily,  ceftriaxone, NS @ 40 ml/hr.   Labs reviewed: Sodium 133, Chloride 98.  Nutrition-Focused physical exam completed. Findings are severe fat depletion, severe muscle depletion, and no edema. Noted bruising on legs and hands. On legs the bruising almost appears to be purpura, which may be indicative of vitamin C deficiency in addition to non-healing wound. Family reports patient bruises very easily.   Diet Order:  Diet 2 gram sodium Room service appropriate? Yes; Fluid consistency: Thin  Skin:  Wound (see comment) (non-pressure wound right elbow with wound VAC)  Last BM:  05/11/2017 - medium type 4  Height:   Ht Readings from Last 1 Encounters:  05/09/17 5' 2"  (1.575 m)    Weight:   Wt Readings from Last 1 Encounters:  05/09/17 125 lb (56.7 kg)    Ideal Body Weight:  50 kg  BMI:  Body mass index is 22.86 kg/m.  Estimated Nutritional Needs:   Kcal:  1700-1930 (30-34 kcal/kg)  Protein:  85-96 grams (1.5-1.7 grams/kg)  Fluid:  1.4-1.7 L/day (25-30 ml/kg)  EDUCATION NEEDS:   Education needs addressed  Willey Blade, [place], RD, [place] Office: [phone] Pager: [phone] After Hours/Weekend Pager: [phone]

## 2017-05-11 NOTE — Plan of Care (Signed)
Problem: Activity: Goal: Risk for activity intolerance will decrease Outcome: Not Progressing Pt unable to walk with PT

## 2017-05-12 LAB — BASIC METABOLIC PANEL
ANION GAP: 6 (ref 5–15)
BUN: 16 mg/dL (ref 6–20)
CALCIUM: 8.7 mg/dL — AB (ref 8.9–10.3)
CO2: 24 mmol/L (ref 22–32)
CREATININE: 0.79 mg/dL (ref 0.44–1.00)
Chloride: 107 mmol/L (ref 101–111)
Glucose, Bld: 99 mg/dL (ref 65–99)
Potassium: 4.1 mmol/L (ref 3.5–5.1)
SODIUM: 137 mmol/L (ref 135–145)

## 2017-05-12 MED ORDER — CIPROFLOXACIN HCL 500 MG PO TABS
500.0000 mg | ORAL_TABLET | Freq: Two times a day (BID) | ORAL | Status: DC
  Administered 2017-05-12 – 2017-05-16 (×8): 500 mg via ORAL
  Filled 2017-05-12 (×8): qty 1

## 2017-05-12 MED ORDER — VITAMIN C 500 MG PO TABS
500.0000 mg | ORAL_TABLET | Freq: Two times a day (BID) | ORAL | Status: DC
  Administered 2017-05-12 – 2017-05-16 (×8): 500 mg via ORAL
  Filled 2017-05-12 (×10): qty 1

## 2017-05-12 MED ORDER — SENNOSIDES-DOCUSATE SODIUM 8.6-50 MG PO TABS
1.0000 | ORAL_TABLET | Freq: Two times a day (BID) | ORAL | Status: DC
  Administered 2017-05-12 – 2017-05-16 (×5): 1 via ORAL
  Filled 2017-05-12 (×5): qty 1

## 2017-05-12 NOTE — Progress Notes (Addendum)
Patient ID: Michelle Cabrera, female   DOB: [birth year], 71 y.o.   MRN: [id number]    [facility] PROGRESS NOTE  Michelle Cabrera [id number] DOB: 07/11/1946 DOA: 05/09/2017 PCP: Lauro Regulus, MD  HPI/Subjective: Patient more alert today. Answering questions. Also told the nurse that she was seeing birds on the wall. Husband concerned about her mental status.  Objective: Vitals:   05/12/17 1035 05/12/17 1258  BP: 119/73 (!) 151/77  Pulse: 86 78  Resp:    Temp: 98.5 F (36.9 C) 98.5 F (36.9 C)  SpO2: 96% 97%    Filed Weights   05/09/17 1516  Weight: 56.7 kg (125 lb)    ROS: Review of Systems  Constitutional: Negative for chills and fever.  Eyes: Negative for blurred vision.  Respiratory: Negative for cough and shortness of breath.   Cardiovascular: Negative for chest pain.  Gastrointestinal: Negative for abdominal pain, constipation, diarrhea, nausea and vomiting.  Genitourinary: Negative for dysuria.  Musculoskeletal: Negative for joint pain and neck pain.  Neurological: Negative for dizziness and headaches.   Exam: Physical Exam  HENT:  Nose: No mucosal edema.  Mouth/Throat: No oropharyngeal exudate or posterior oropharyngeal edema.  Eyes: Pupils are equal, round, and reactive to light. Conjunctivae, EOM and lids are normal.  Neck: No JVD present. Carotid bruit is not present. No edema present. No thyroid mass and no thyromegaly present.  Cardiovascular: S1 normal and S2 normal.  Exam reveals no gallop.   No murmur heard. Pulses:      Dorsalis pedis pulses are 2+ on the right side, and 2+ on the left side.  Respiratory: No respiratory distress. She has no wheezes. She has no rhonchi. She has no rales.  GI: Soft. Bowel sounds are normal. There is no tenderness.  Musculoskeletal:       Right ankle: She exhibits no swelling.       Left ankle: She exhibits no swelling.  Lymphadenopathy:    She has no cervical adenopathy.  Neurological: She is alert.  No cranial nerve deficit.  Patient able to lift up her left arm  Skin: Skin is warm. Nails show no clubbing.  Nonhealing ulcer right elbow covered with wound VAC  Psychiatric: She has a normal mood and affect.      Data Reviewed: Basic Metabolic Panel:  Recent Labs Lab 05/09/17 1518 05/10/17 0426 05/11/17 0527 05/12/17 0527  NA 133* 137 133* 137  K 3.3* 2.8* 5.0 4.1  CL 96* 102 98* 107  CO2 23 26 25 24   GLUCOSE 140* 101* 178* 99  BUN 18 16 19 16   CREATININE 0.88 0.69 0.69 0.79  CALCIUM 8.9 8.0* 9.1 8.7*  MG  --  1.7 2.0  --    Liver Function Tests:  Recent Labs Lab 05/09/17 1518  AST 31  ALT 11*  ALKPHOS 145*  BILITOT 0.4  PROT 8.3*  ALBUMIN 2.7*   CBC:  Recent Labs Lab 05/09/17 1518 05/10/17 0426  WBC 13.5* 9.4  NEUTROABS 9.5*  --   HGB 12.1 10.1*  HCT 36.8 30.1*  MCV 87.6 87.8  PLT 804* 604*    Scheduled Meds: . aspirin EC  81 mg Oral Daily  . calcium-vitamin D  2 tablet Oral Q breakfast  . cholecalciferol  4,000 Units Oral Daily  . DULoxetine  30 mg Oral QHS  . DULoxetine  60 mg Oral BH-q7a  . enoxaparin (LOVENOX) injection  40 mg Subcutaneous Q24H  . feeding supplement (ENSURE ENLIVE)  237 mL Oral  TID BM  . ferrous sulfate  325 mg Oral BID WC  . fluticasone furoate-vilanterol  1 puff Inhalation q morning - 10a  . folic acid  1 mg Oral Daily  . Influenza vac split quadrivalent PF  0.5 mL Intramuscular Tomorrow-1000  . metoprolol tartrate  25 mg Oral BID  . multivitamin with minerals  1 tablet Oral Daily  . pantoprazole  40 mg Oral Daily  . QUEtiapine  25 mg Oral QHS  . senna-docusate  1 tablet Oral BID  . sodium chloride flush  3 mL Intravenous Q12H  . vitamin C  500 mg Oral BID   Continuous Infusions: . sodium chloride    . sodium chloride 40 mL/hr at 05/12/17 1422  . cefTRIAXone (ROCEPHIN)  IV Stopped (05/11/17 2157)    Assessment/Plan:  1. Acute encephalopathy. Likely secondary to urinary tract infection and medications.  Decreased dose of Seroquel down to 25 mg daily at bedtime. Decrease frequency of pain medications. Stopped prednisone. 2. Hypokalemia and hypomagnesemia. Electrolytes replaced. 3. Partially treated Serratia UTI. Rocephin changed to po cipro since pulled out iv.  Our urine culture here is negative. 4. Hallucinations with seeing birds on the wall.  Seroquel at night. 5. Neck pain and left shoulder pain. resolved 6. Weakness. Physical therapy evaluation. 7. Depression continue psychiatric medications. 8. Nonhealing ulcer on the right arm on a wound VAC. Follows with surgery as outpatient.  Code Status:     Code Status Orders        Start     Ordered   05/09/17 2216  Full code  Continuous     05/09/17 2215    Code Status History    Date Active Date Inactive Code Status Order ID Comments User Context   02/18/2017  5:30 AM 02/22/2017  3:44 PM Full Code [id number]  Ihor Austin, MD Inpatient   10/01/2016  1:20 PM 10/01/2016  6:20 PM Full Code [id number]  Kennedy Bucker, MD Inpatient   02/04/2016  4:31 PM 02/09/2016  5:14 PM Full Code [id number]  Marguarite Arbour, MD Inpatient   11/10/2015  4:58 AM 11/11/2015  6:48 PM Full Code [id number]  Enedina Finner, MD Inpatient   09/06/2015  4:04 AM 09/08/2015  8:04 PM Full Code [id number]  Ihor Austin, MD ED   07/26/2015  3:51 PM 08/01/2015  4:32 PM Full Code [id number]  Shaune Pollack, MD Inpatient   09/15/2014  6:36 PM 09/16/2014  5:22 PM Full Code [id number]  Dominica Severin, MD Inpatient   02/07/2014 11:04 AM 02/07/2014 10:39 PM Full Code [id number]  Hewitt Shorts, MD Inpatient   12/26/2012 11:54 PM 01/05/2013  3:34 PM Full Code [id number]  Ron Parker, MD ED     Family Communication: husband at the bedside Disposition Plan: potentially home with home health  Antibiotics:  Rocephin change to Cipro  Time spent: 25 minutes  Alford Highland  [employer]

## 2017-05-12 NOTE — Progress Notes (Signed)
Physical Therapy Evaluation Patient Details Name: Michelle Cabrera MRN: [id number] DOB: 02/14/1946 Today's Date: 05/12/2017   History of Present Illness  Pt admitted for UTI, had been treating as outpatient for 2 weeks with meds. Had been complaining of confusion, nausea and UTI. PMH of anxiety, chronic LBP, COPD, depression, and irrigation/debridement of R elbow from prior olecranon fx.   Clinical Impression  Pt is a pleasant 71 year old female admitted for UTI. Pt performed there-ex, bed mobility with mod. I, transfers and amb with platform RW with CGA. Pt amb a total of 45 ft, pt amb at a quick pace due to urge to use bathroom. Pt performed toileting duties using BSC in bathroom with CGA. Nursing came in during session to provide meds and take vitals. Pt appeared motivated to participate in all PT activities. Pt demonstrates deficits with strength, endurance, transfers and amb. Would benefit from skilled PT services to promote optimal return to home. Recommend transition to HHPT upon DC from acute hospitalization.     Follow Up Recommendations Home health PT;Supervision/Assistance - 24 hour    Equipment Recommendations  None recommended by PT (pt has R platform walker)    Recommendations for Other Services       Precautions / Restrictions Precautions Precautions: Fall;Other (comment) (R elbow ) Restrictions Weight Bearing Restrictions: No      Mobility  Bed Mobility Overal bed mobility: Modified Independent             General bed mobility comments: Utilizes bed rails, no cues needed  Transfers Overall transfer level: Needs assistance Equipment used: Right platform walker Transfers: Sit to/from Stand Sit to Stand: Min guard         General transfer comment: Cues to lean trunk forward, cues for proper hand placement to push off bed  Ambulation/Gait Ambulation/Gait assistance: Min guard Ambulation Distance (Feet): 50 Feet Assistive device: Right platform  walker Gait Pattern/deviations: Step-through pattern     General Gait Details: alternating gait, no cues needed   Stairs            Wheelchair Mobility    Modified Rankin (Stroke Patients Only)       Balance Overall balance assessment: Needs assistance Sitting-balance support: Feet supported Sitting balance-Leahy Scale: Good Sitting balance - Comments: Pt able to sit at EOB and maintain position for several min, no cues or assist needed   Standing balance support: Bilateral upper extremity supported Standing balance-Leahy Scale: Good Standing balance comment: Pt able to maintain standing position to perform toileting tasks, slight dizziness reported, no LOB                             Pertinent Vitals/Pain Pain Assessment: 0-10 Pain Score: 8  Pain Location: "I have RA, it hurts all over" Pain Intervention(s): Limited activity within patient's tolerance;Monitored during session;Repositioned    Home Living Family/patient expects to be discharged to:: Private residence Living Arrangements: Spouse/significant other Available Help at Discharge: Family;Available 24 hours/day Type of Home: House Home Access: Ramped entrance     Home Layout: One level Home Equipment: Walker - 4 wheels;Cane - single point;Walker - 2 wheels Additional Comments: Pt reports using R platform walker for amb within home.     Prior Function Level of Independence: Needs assistance         Comments: Pt states husband provides assistance for help with ADLs     Hand Dominance  Extremity/Trunk Assessment   Upper Extremity Assessment Upper Extremity Assessment: Generalized weakness (LUE MMT grossly 3+/5, grip intact B)    Lower Extremity Assessment Lower Extremity Assessment: Generalized weakness (MMT grossly 4/5, RLE weaker than R during ther-ex)    Cervical / Trunk Assessment Cervical / Trunk Assessment: Normal  Communication   Communication: No difficulties   Cognition Arousal/Alertness: Awake/alert Behavior During Therapy: WFL for tasks assessed/performed Overall Cognitive Status: Impaired/Different from baseline (disoriented to time)                                        General Comments      Exercises Other Exercises Other Exercises: Supine ther-ex 10x B, ankle pumps, SLRs, hip abd/add. Cues for proper technique. No assist needed. Demonstrated difficulty with RLE exercises Other Exercises: Assisted pt to [facility] in bathroom and performed toileting tasks   Assessment/Plan    PT Assessment Patient needs continued PT services  PT Problem List Decreased strength;Decreased range of motion;Decreased activity tolerance;Decreased balance;Decreased mobility;Decreased knowledge of use of DME;Pain       PT Treatment Interventions DME instruction;Gait training;Functional mobility training;Therapeutic activities;Therapeutic exercise;Balance training;Patient/family education    PT Goals (Current goals can be found in the Care Plan section)  Acute Rehab PT Goals Patient Stated Goal: to return home PT Goal Formulation: With patient Time For Goal Achievement: 05/26/17 Potential to Achieve Goals: Good    Frequency Min 2X/week   Barriers to discharge        Co-evaluation               AM-PAC PT "6 Clicks" Daily Activity  Outcome Measure Difficulty turning over in bed (including adjusting bedclothes, sheets and blankets)?: None Difficulty moving from lying on back to sitting on the side of the bed? : None Difficulty sitting down on and standing up from a chair with arms (e.g., wheelchair, bedside commode, etc,.)?: Unable Help needed moving to and from a bed to chair (including a wheelchair)?: A Little Help needed walking in hospital room?: A Little Help needed climbing 3-5 steps with a railing? : A Little 6 Click Score: 18    End of Session Equipment Utilized During Treatment: Gait belt Activity Tolerance: Patient  tolerated treatment well Patient left: in chair;with chair alarm set;with call bell/phone within reach;with family/visitor present Nurse Communication: Mobility status PT Visit Diagnosis: Other abnormalities of gait and mobility (R26.89);Muscle weakness (generalized) (M62.81);Pain    Time: 0926-1020 PT Time Calculation (min) (ACUTE ONLY): 54 min   Charges:         PT G Codes:   PT G-Codes **NOT FOR INPATIENT CLASS** Functional Assessment Tool Used: AM-PAC 6 Clicks Basic Mobility Functional Limitation: Mobility: Walking and moving around Mobility: Walking and Moving Around Current Status (Q8250): At least 40 percent but less than 60 percent impaired, limited or restricted Mobility: Walking and Moving Around Goal Status [id number]): At least 20 percent but less than 40 percent impaired, limited or restricted    Renford Dills, SPT  Renford Dills 05/12/2017, 12:02 PM

## 2017-05-12 NOTE — Clinical Social Work Note (Signed)
Clinical Social Work Assessment  Patient Details  Name: Michelle Cabrera MRN: [id number] Date of Birth: 10/30/1945  Date of referral:  05/12/17               Reason for consult:  Facility Placement                Permission sought to share information with:    Permission granted to share information::     Name::        Agency::     Relationship::     Contact Information:     Housing/Transportation Living arrangements for the past 2 months:  Single Family Home Source of Information:  Patient, Spouse Patient Interpreter Needed:  None Criminal Activity/Legal Involvement Pertinent to Current Situation/Hospitalization:  No - Comment as needed Significant Relationships:  Spouse Lives with:  Spouse Do you feel safe going back to the place where you live?  Yes Need for family participation in patient care:  Yes (Comment)  Care giving concerns:  Patient lives in [place] with her husband Michelle Cabrera [phone].    Social Worker assessment / plan:  Clinical Social Worker (CSW) reviewed patient's chart and noted that OT is recommending SNF. Clinical Social Worker (CSW) met with patient her husband Michelle Cabrera was at bedside. Patient reported that she wants to go home and is ready to leave [facility]. Patient was pleasantly confused. CSW introduced self and explained role of CSW department. Per husband patient was at [facility] for 21 days in July of 2018 and has been home for well over 60 days. Per husband patient has a wound vac and home health PT at home. Husband reported that he would like for patient to go to SNF if [employer] will pay for it.  PT is recommending home health. CSW made husband aware that [employer] will not pay for SNF because PT is recommending home health. Husband verbalized his understanding. RN case manager and MD aware of above. CSW will continue to follow and assist as needed.   Employment status:  Retired Insurance information:  Managed Medicare PT  Recommendations:  Home with Home Health, 24 Hour Supervision Information / Referral to community resources:  Other (Comment Required) (PT is recommending home health. )  Patient/Family's Response to care:  Patient's husband is agreeable for patient to go home.   Patient/Family's Understanding of and Emotional Response to Diagnosis, Current Treatment, and Prognosis:  Patient and her husband were very pleasant and thanked CSW for visit.   Emotional Assessment Appearance:  Appears stated age Attitude/Demeanor/Rapport:    Affect (typically observed):  Pleasant Orientation:  Oriented to Self, Oriented to Place, Fluctuating Orientation (Suspected and/or reported Sundowners) Alcohol / Substance use:  Not Applicable Psych involvement (Current and /or in the community):  No (Comment)  Discharge Needs  Concerns to be addressed:  Discharge Planning Concerns Readmission within the last 30 days:  No Current discharge risk:  Dependent with Mobility Barriers to Discharge:  Continued Medical Work up   ,  M, LCSW 05/12/2017, 1:14 PM  

## 2017-05-12 NOTE — Care Management Note (Signed)
Case Management Note  Patient Details  Name: Michelle Cabrera MRN: [id number] Date of Birth: 1946-03-07  Subjective/Objective:  Admitted to [facility] with the diagnosis of UTI. Lives with husband, Maisie Fus [phone]) x 53 years.  Last seen Dr. Dareen Piano a month ago. Prescriptions are filled at [facility] in  [place]. .Followed by [facility] home [facility] now. [facility] x 2 and [facility] x 1 in the past. No home oxygen. Rolling walker, royalor, bedside commode, and raised toilet seat in the home. Wound vac in place. Self feed, needs help with dressing and baths,  Fell 3 months ago and broke hip and arm. Decreased appetite.                   Action/Plan:  Physical therapy evaluation completed. Recommending home health and physical therapy in the home. Will need resumption of services per [facility].   Expected Discharge Date:                  Expected Discharge Plan:     In-House Referral:     Discharge planning Services     Post Acute Care Choice:    Choice offered to:     DME Arranged:    DME Agency:     HH Arranged:    HH Agency:     Status of Service:     If discussed at [employer], dates discussed:    Additional Comments:  [name], RN MSN CCM Care Management [phone] 05/12/2017, 2:13 PM

## 2017-05-12 NOTE — [place] FL2 (Signed)
[place] MEDICAID FL2 LEVEL OF CARE SCREENING TOOL     IDENTIFICATION  Patient Name: Michelle Cabrera Birthdate: 06-Apr-1946 Sex: female Admission Date (Current Location): 05/09/2017  [facility] and [place] Number:  Chiropodist and Address:  [facility], [address] [place], [place] [address]      Provider Number: [id number]  Attending Physician Name and Address:  Alford Highland, MD  Relative Name and Phone Number:       Current Level of Care: Hospital Recommended Level of Care: Skilled Nursing Facility Prior Approval Number:    Date Approved/Denied:   PASRR Number:    Discharge Plan: SNF    Current Diagnoses: Patient Active Problem List   Diagnosis Date Noted  . UTI (urinary tract infection) 05/09/2017  . Tachycardia 03/13/2017  . Protein calorie malnutrition (HCC) 03/13/2017  . Closed fracture of right olecranon process with routine healing 03/13/2017  . Hip fracture (HCC) 02/18/2017  . Bilateral carotid artery stenosis 12/10/2016  . Subclavian arterial stenosis (HCC) 12/10/2016  . Thoracic compression fracture (HCC) 02/04/2016  . UTI (lower urinary tract infection) 02/04/2016  . Schizophrenia spectrum disorder with psychotic disorder type not yet determined (HCC) 11/13/2015  . Syncope 11/10/2015  . Hypotension 11/10/2015  . 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 Pressure ulcer 09/08/2015  . New onset type 2 diabetes mellitus (HCC) 09/06/2015  . Altered mental status 09/06/2015  . COPD exacerbation (HCC) 07/26/2015  . Arthritis associated with another disorder 09/15/2014  . Wedge compression fracture of T6 vertebra (HCC) 02/07/2014  . Intractable low back pain 12/27/2012  . DDD (degenerative disc disease) 12/27/2012  . Rheumatoid arthritis (HCC) 12/27/2012  . Hypertension 12/27/2012    Orientation RESPIRATION BLADDER Height & Weight     Self, Place  Normal Incontinent Weight: 125 lb (56.7 kg) Height:  5\' 2"  (157.5 cm)   BEHAVIORAL SYMPTOMS/MOOD NEUROLOGICAL BOWEL NUTRITION STATUS      Continent Diet (Diet 2 Grams Sodium. )  AMBULATORY STATUS COMMUNICATION OF NEEDS Skin   Limited Assist Verbally PU Stage and Appropriate Care, Wound Vac (pressure ulcer right elbow with wound vac. )                       Personal Care Assistance Level of Assistance  Bathing, Feeding, Dressing Bathing Assistance: Limited assistance Feeding assistance: Independent Dressing Assistance: Limited assistance     Functional Limitations Info  Sight, Hearing, Speech Sight Info: Adequate Hearing Info: Adequate Speech Info: Adequate    SPECIAL CARE FACTORS FREQUENCY  PT (By licensed PT), OT (By licensed OT)     PT Frequency:  (5) OT Frequency:  (5)            Contractures      Additional Factors Info  Code Status, Allergies Code Status Info:  (Full Code. ) Allergies Info:  (Ambien Zolpidem Tartrate, Dilaudid Hydromorphone Hcl, Gold-containing Drug Products, Adhesive Tape, Haldol Haloperidol)           Current Medications (05/12/2017):  This is the current hospital active medication list Current Facility-Administered Medications  Medication Dose Route Frequency Provider Last Rate Last Dose  . 0.9 %  sodium chloride infusion  250 mL Intravenous PRN 05/14/2017, MD      . 0.9 %  sodium chloride infusion   Intravenous Continuous Auburn Bilberry, MD 40 mL/hr at 05/11/17 1710    . acetaminophen (TYLENOL) tablet 650 mg  650 mg Oral Q6H PRN 05/13/17, MD  Or  . acetaminophen (TYLENOL) suppository 650 mg  650 mg Rectal Q6H PRN Auburn Bilberry, MD      . acetaminophen (TYLENOL) tablet 1,000 mg  1,000 mg Oral Daily PRN Auburn Bilberry, MD      . albuterol (PROVENTIL) (2.5 MG/3ML) 0.083% nebulizer solution 2.5 mg  2.5 mg Inhalation Q6H PRN Auburn Bilberry, MD      . aspirin EC tablet 81 mg  81 mg Oral Daily Auburn Bilberry, MD   81 mg at 05/12/17 1607  . calcium-vitamin D (OSCAL WITH D) 500-200  MG-UNIT per tablet 2 tablet  2 tablet Oral Q breakfast Auburn Bilberry, MD   2 tablet at 05/12/17 [phone]  . cefTRIAXone (ROCEPHIN) 1 g in dextrose 5 % 50 mL IVPB  1 g Intravenous Q24H Auburn Bilberry, MD   Stopped at 05/11/17 2157  . cholecalciferol (VITAMIN D) tablet 4,000 Units  4,000 Units Oral Daily Auburn Bilberry, MD   4,000 Units at 05/12/17 [phone]  . DULoxetine (CYMBALTA) DR capsule 30 mg  30 mg Oral QHS Auburn Bilberry, MD   30 mg at 05/11/17 2112  . DULoxetine (CYMBALTA) DR capsule 60 mg  60 mg Oral Willene Hatchet, MD   60 mg at 05/12/17 0951  . enoxaparin (LOVENOX) injection 40 mg  40 mg Subcutaneous Q24H Auburn Bilberry, MD   40 mg at 05/11/17 2112  . feeding supplement (ENSURE ENLIVE) (ENSURE ENLIVE) liquid 237 mL  237 mL Oral TID BM Alford Highland, MD   237 mL at 05/12/17 1100  . ferrous sulfate tablet 325 mg  325 mg Oral BID WC Auburn Bilberry, MD   325 mg at 05/12/17 0952  . fluticasone furoate-vilanterol (BREO ELLIPTA) 100-25 MCG/INH 1 puff  1 puff Inhalation q morning - 10a Auburn Bilberry, MD   1 puff at 05/12/17 1000  . folic acid (FOLVITE) tablet 1 mg  1 mg Oral Daily Auburn Bilberry, MD   1 mg at 05/12/17 4854  . HYDROcodone-acetaminophen (NORCO/VICODIN) 5-325 MG per tablet 1 tablet  1 tablet Oral Q6H PRN Alford Highland, MD   1 tablet at 05/12/17 1303  . ibuprofen (ADVIL,MOTRIN) tablet 400 mg  400 mg Oral Q8H PRN Auburn Bilberry, MD      . Influenza vac split quadrivalent PF (FLUZONE HIGH-DOSE) injection 0.5 mL  0.5 mL Intramuscular Tomorrow-1000 Enedina Finner, MD      . metoprolol tartrate (LOPRESSOR) tablet 25 mg  25 mg Oral BID Auburn Bilberry, MD   25 mg at 05/12/17 1000  . morphine (MSIR) tablet 15 mg  15 mg Oral Q8H PRN Alford Highland, MD      . multivitamin with minerals tablet 1 tablet  1 tablet Oral Daily Alford Highland, MD   1 tablet at 05/12/17 0951  . ondansetron (ZOFRAN) tablet 4 mg  4 mg Oral Q6H PRN Auburn Bilberry, MD       Or  . ondansetron (ZOFRAN)  injection 4 mg  4 mg Intravenous Q6H PRN Auburn Bilberry, MD      . pantoprazole (PROTONIX) EC tablet 40 mg  40 mg Oral Daily Auburn Bilberry, MD   40 mg at 05/12/17 6270  . polyethylene glycol (MIRALAX / GLYCOLAX) packet 17 g  17 g Oral Daily PRN Auburn Bilberry, MD      . QUEtiapine (SEROQUEL) tablet 25 mg  25 mg Oral QHS Alford Highland, MD   25 mg at 05/11/17 2112  . sodium chloride flush (NS) 0.9 % injection 3 mL  3 mL Intravenous Q12H  Auburn Bilberry, MD   3 mL at 05/11/17 2113  . sodium chloride flush (NS) 0.9 % injection 3 mL  3 mL Intravenous PRN Auburn Bilberry, MD   3 mL at 05/11/17 1901  . vitamin C (ASCORBIC ACID) tablet 500 mg  500 mg Oral BID Alford Highland, MD   500 mg at 05/12/17 6433     Discharge Medications: Please see discharge summary for a list of discharge medications.  Relevant Imaging Results:  Relevant Lab Results:   Additional Information  (SSN: [id number])  Shaguana Love, Darleen Crocker, LCSW

## 2017-05-12 NOTE — Care Management Important Message (Signed)
Important Message  Patient Details  Name: Michelle Cabrera MRN: [id number] Date of Birth: May 20, 1946   Medicare Important Message Given:  Yes    [name], RN 05/12/2017, 8:14 AM

## 2017-05-12 NOTE — Progress Notes (Signed)
Patient pulled out her IV causing a small skin tear at IV site, cleansed and foam dressing placed

## 2017-05-13 ENCOUNTER — Inpatient Hospital Stay: Payer: Medicare Other

## 2017-05-13 DIAGNOSIS — Z79891 Long term (current) use of opiate analgesic: Secondary | ICD-10-CM

## 2017-05-13 DIAGNOSIS — F05 Delirium due to known physiological condition: Secondary | ICD-10-CM

## 2017-05-13 DIAGNOSIS — K5900 Constipation, unspecified: Secondary | ICD-10-CM

## 2017-05-13 DIAGNOSIS — R410 Disorientation, unspecified: Secondary | ICD-10-CM

## 2017-05-13 LAB — CBC
HCT: 33.4 % — ABNORMAL LOW (ref 35.0–47.0)
Hemoglobin: 10.8 g/dL — ABNORMAL LOW (ref 12.0–16.0)
MCH: 28.5 pg (ref 26.0–34.0)
MCHC: 32.5 g/dL (ref 32.0–36.0)
MCV: 87.7 fL (ref 80.0–100.0)
PLATELETS: 774 10*3/uL — AB (ref 150–440)
RBC: 3.81 MIL/uL (ref 3.80–5.20)
RDW: 17.9 % — AB (ref 11.5–14.5)
WBC: 11.3 10*3/uL — AB (ref 3.6–11.0)

## 2017-05-13 MED ORDER — MORPHINE SULFATE ER 30 MG PO TBCR
30.0000 mg | EXTENDED_RELEASE_TABLET | Freq: Two times a day (BID) | ORAL | Status: DC
  Administered 2017-05-13 – 2017-05-14 (×3): 30 mg via ORAL
  Filled 2017-05-13 (×3): qty 1

## 2017-05-13 MED ORDER — DOCUSATE SODIUM 100 MG PO CAPS
100.0000 mg | ORAL_CAPSULE | Freq: Two times a day (BID) | ORAL | Status: DC
  Administered 2017-05-14 – 2017-05-16 (×5): 100 mg via ORAL
  Filled 2017-05-13 (×5): qty 1

## 2017-05-13 MED ORDER — PANTOPRAZOLE SODIUM 40 MG PO TBEC
40.0000 mg | DELAYED_RELEASE_TABLET | Freq: Two times a day (BID) | ORAL | Status: DC
  Administered 2017-05-13 – 2017-05-16 (×6): 40 mg via ORAL
  Filled 2017-05-13 (×4): qty 1

## 2017-05-13 MED ORDER — MEGESTROL ACETATE 20 MG PO TABS
40.0000 mg | ORAL_TABLET | Freq: Every day | ORAL | Status: DC
  Administered 2017-05-13 – 2017-05-16 (×4): 40 mg via ORAL
  Filled 2017-05-13 (×4): qty 2

## 2017-05-13 MED ORDER — OLANZAPINE 5 MG PO TABS
5.0000 mg | ORAL_TABLET | Freq: Every day | ORAL | Status: DC
  Administered 2017-05-13 – 2017-05-15 (×3): 5 mg via ORAL
  Filled 2017-05-13 (×4): qty 1

## 2017-05-13 NOTE — BH Assessment (Signed)
Dr.Clapacs aware of Psych MD consult request. 

## 2017-05-13 NOTE — Progress Notes (Signed)
Patient ID: Michelle Cabrera, female   DOB: Apr 08, 1946, 71 y.o.   MRN: [id number]     [employer] PROGRESS NOTE  Michelle Cabrera GYJ:[id number] DOB: October 25, 1945 DOA: 05/09/2017 PCP: Lauro Regulus, MD  HPI/Subjective: Patient more alert today. Answering questions. She is not eating very much. Patient also seeing birds on her walker.  Objective: Vitals:   05/13/17 0931 05/13/17 1326  BP: 116/73 (!) 153/74  Pulse: 85 80  Resp: 16   Temp: 98 F (36.7 C) 98.7 F (37.1 C)  SpO2: 100% 96%    Filed Weights   05/09/17 1516  Weight: 56.7 kg (125 lb)    ROS: Review of Systems  Unable to perform ROS: Acuity of condition  Respiratory: Negative for shortness of breath.   Cardiovascular: Negative for chest pain.  Gastrointestinal: Negative for abdominal pain.  Musculoskeletal: Positive for joint pain.   Exam: Physical Exam  HENT:  Nose: No mucosal edema.  Mouth/Throat: No oropharyngeal exudate or posterior oropharyngeal edema.  Eyes: Pupils are equal, round, and reactive to light. Conjunctivae, EOM and lids are normal.  Neck: No JVD present. Carotid bruit is not present. No edema present. No thyroid mass and no thyromegaly present.  Cardiovascular: S1 normal and S2 normal.  Exam reveals no gallop.   No murmur heard. Pulses:      Dorsalis pedis pulses are 2+ on the right side, and 2+ on the left side.  Respiratory: No respiratory distress. She has no wheezes. She has no rhonchi. She has no rales.  GI: Soft. Bowel sounds are normal. There is no tenderness.  Musculoskeletal:       Right ankle: She exhibits no swelling.       Left ankle: She exhibits no swelling.  Lymphadenopathy:    She has no cervical adenopathy.  Neurological: She is alert. No cranial nerve deficit.  Patient able to lift up her left arm  Skin: Skin is warm. Nails show no clubbing.  Nonhealing ulcer right elbow covered with wound VAC  Psychiatric: She has a normal mood and affect.      Data  Reviewed: Basic Metabolic Panel:  Recent Labs Lab 05/09/17 1518 05/10/17 0426 05/11/17 0527 05/12/17 0527  NA 133* 137 133* 137  K 3.3* 2.8* 5.0 4.1  CL 96* 102 98* 107  CO2 23 26 25 24   GLUCOSE 140* 101* 178* 99  BUN 18 16 19 16   CREATININE 0.88 0.69 0.69 0.79  CALCIUM 8.9 8.0* 9.1 8.7*  MG  --  1.7 2.0  --    Liver Function Tests:  Recent Labs Lab 05/09/17 1518  AST 31  ALT 11*  ALKPHOS 145*  BILITOT 0.4  PROT 8.3*  ALBUMIN 2.7*   CBC:  Recent Labs Lab 05/09/17 1518 05/10/17 0426 05/13/17 0544  WBC 13.5* 9.4 11.3*  NEUTROABS 9.5*  --   --   HGB 12.1 10.1* 10.8*  HCT 36.8 30.1* 33.4*  MCV 87.6 87.8 87.7  PLT 804* 604* 774*    Scheduled Meds: . calcium-vitamin D  2 tablet Oral Q breakfast  . cholecalciferol  4,000 Units Oral Daily  . ciprofloxacin  500 mg Oral BID  . DULoxetine  30 mg Oral QHS  . DULoxetine  60 mg Oral BH-q7a  . enoxaparin (LOVENOX) injection  40 mg Subcutaneous Q24H  . feeding supplement (ENSURE ENLIVE)  237 mL Oral TID BM  . ferrous sulfate  325 mg Oral BID WC  . fluticasone furoate-vilanterol  1 puff Inhalation q morning -  10a  . folic acid  1 mg Oral Daily  . Influenza vac split quadrivalent PF  0.5 mL Intramuscular Tomorrow-1000  . metoprolol tartrate  25 mg Oral BID  . multivitamin with minerals  1 tablet Oral Daily  . pantoprazole  40 mg Oral Daily  . pantoprazole  40 mg Oral BID AC  . QUEtiapine  25 mg Oral QHS  . senna-docusate  1 tablet Oral BID  . vitamin C  500 mg Oral BID    Assessment/Plan:  1. Acute delirium. Likely combination of being in the hospital, urinary tract infection, pain medications. Patient having visual hallucinations. Patient has an allergy to Haldol. Psychiatric consultation. Patient was on high-dose Seroquel which I had to decrease the dose secondary to lethargy. 2. Hypokalemia and hypomagnesemia. Electrolytes replaced. 3. Partially treated Serratia UTI. Rocephin changed to po cipro since pulled  out iv.  Our urine culture here is negative. 4. Neck pain and left shoulder pain. resolved 5. Weakness. Physical therapy evaluation appreciated 6. Depression continue psychiatric medications. 7. Nonhealing ulcer on the right arm on a wound VAC. Follows with surgery as outpatient.  Code Status:     Code Status Orders        Start     Ordered   05/09/17 2216  Full code  Continuous     05/09/17 2215    Code Status History    Date Active Date Inactive Code Status Order ID Comments User Context   02/18/2017  5:30 AM 02/22/2017  3:44 PM Full Code [id number]  Ihor Austin, MD Inpatient   10/01/2016  1:20 PM 10/01/2016  6:20 PM Full Code [id number]  Kennedy Bucker, MD Inpatient   02/04/2016  4:31 PM 02/09/2016  5:14 PM Full Code [id number]  Marguarite Arbour, MD Inpatient   11/10/2015  4:58 AM 11/11/2015  6:48 PM Full Code [id number]  Enedina Finner, MD Inpatient   09/06/2015  4:04 AM 09/08/2015  8:04 PM Full Code [id number]  Ihor Austin, MD ED   07/26/2015  3:51 PM 08/01/2015  4:32 PM Full Code [id number]  Shaune Pollack, MD Inpatient   09/15/2014  6:36 PM 09/16/2014  5:22 PM Full Code [id number]  Dominica Severin, MD Inpatient   02/07/2014 11:04 AM 02/07/2014 10:39 PM Full Code [id number]  Hewitt Shorts, MD Inpatient   12/26/2012 11:54 PM 01/05/2013  3:34 PM Full Code [id number]  Ron Parker, MD ED     Family Communication: husband at the bedside Disposition Plan: potentially home with home health once delirium clears  Antibiotics:  Rocephin change to po Cipro  Time spent: 25 minutes  Alford Highland  [employer]

## 2017-05-13 NOTE — Consult Note (Signed)
[facility] Consult   Reason for Consult:  Consult for patient with multiple medical problems. Concern about delirium Referring Physician:  Earleen Newport Patient Identification: Michelle Cabrera MRN:  [id number] Principal Diagnosis: Subacute delirium Diagnosis:   Patient Active Problem List   Diagnosis Date Noted  . Subacute delirium [F05] 05/13/2017  . Chronic prescription opiate use [Z79.891] 05/13/2017  . UTI (urinary tract infection) [N39.0] 05/09/2017  . Tachycardia [R00.0] 03/13/2017  . Protein calorie malnutrition ([place]) [E46] 03/13/2017  . Closed fracture of right olecranon process with routine healing [S52.021D] 03/13/2017  . Hip fracture ([place]) [S72.009A] 02/18/2017  . Bilateral carotid artery stenosis [I65.23] 12/10/2016  . 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 Thoracic compression fracture ([place]) [S22.000A] 02/04/2016  . UTI (lower urinary tract infection) [N39.0] 02/04/2016  . Schizophrenia spectrum disorder with psychotic disorder type not yet determined ([place]) [F29] 11/13/2015  . Syncope [R55] 11/10/2015  . Hypotension [I95.9] 11/10/2015  . 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 Pressure ulcer [L89.90] 09/08/2015  . New onset type 2 diabetes mellitus ([place]) [E11.9] 09/06/2015  . Altered mental status [R41.82] 09/06/2015  . COPD exacerbation ([place]) [J44.1] 07/26/2015  . Arthritis associated with another disorder [M19.90] 09/15/2014  . Wedge compression fracture of T6 vertebra ([place]) [S22.050A] 02/07/2014  . Intractable low back pain [M54.5] 12/27/2012  . DDD (degenerative disc disease) [IMO0002] 12/27/2012  . Rheumatoid arthritis ([place]) [M06.9] 12/27/2012  . Hypertension [I10] 12/27/2012    Total Time spent with patient: 1 hour  Subjective:   Michelle Cabrera is a 71 y.o. female patient admitted with "they needed to do something".  HPI:  Patient interviewed. Chart reviewed.t has been treated for multiple medical problems concern about  delirium. Patient was admitted to the hospital with a history of recent mental status change. She was confused and disoriented. Found to have a urinary tract infection. A couple days later she remains confused. On interview today the patient was confused about where she was. Did not know the year correctly. Did not understand the situation. Patient was not initially agitated although she was picking at some of her dressings. She is complaining of pain but does not appear to be in distinct agony. Husband came in during the interview and states that her current mental status is typical of what she has had when she had urinary tract infections in the past. Husband reports that the patient normally takes oxymorphone 5 mg 4 times a day at home. I confirmed that this is her standing prescription. He also reports that normally when she takes this she is not oversedated or delirious at home. She had also been taking Seroquel 125 or 150 mg at home. She went to a doctor recently who told her she needed to cut back on the dosage of that although the husband reports that she had not been confused with that dose. He also reports that she has had chronic constipation. Patient is not complaining of mood symptoms. Not complaining of suicidality. She does not spontaneously discusses having hallucinations although the husband says she has been seeing things today.  Social history: Patient lives at home with her husband. Not employed outside the home. Chronically disabled.  Medical history: Rheumatoid arthritis history of recurrent urinary tract infections. Patient has had documented spells of delirium and confusion when she was acutely sick in the past attended clear up pretty quickly.  Substance abuse history: I was not able to identify any clear evidence of substance abuse issues in the past.  Past Psychiatric History: Patient has been seen previously by the psychiatric service under similar circumstances. There was one  episode in which she was sent to [facility] because of persistent confusion. Husband reports that she stayed there only a short period of time and was thought to not have an ongoing psychotic disorder. They did not change her medicine but left her on the modest dose of Seroquel. No history of suicide attempts or violence.  Risk to Self: Is patient at risk for suicide?: No Risk to Others:   Prior Inpatient Therapy:   Prior Outpatient Therapy:    Past Medical History:  Past Medical History:  Diagnosis Date  . Anxiety   . Arthritis   . Cataract   . Cervicalgia   . Chronic lower back pain   . Closed compression fracture of L1 lumbar vertebra ([place]) 11/2012  . Collagen vascular disease ([place])   . COPD (chronic obstructive pulmonary disease) ([place])    unspecified  . DDD (degenerative disc disease)   . Depression   . Diabetes mellitus ([place])   . Disorder of bursae and tendons in shoulder region    unspecified  . Fall 09/27/2016   Fall Feb 04, 2016 with broken vertebra  . GERD (gastroesophageal reflux disease)   . Headache   . Hereditary and idiopathic peripheral neuropathy    unspecified  . History of adenomatous polyp of colon    Followed by Dr. Tiffany Kocher  . History of tubal ligation   . Hypertension   . Intermediate coronary syndrome ([place])   . Lumbago   . Neuralgia and neuritis, unspecified    radiculitis unspecified  . Rheumatoid arthritis ([place])   . Rheumatoid arthritis ([place])   . Senile osteoporosis   . Spinal stenosis, lumbar region without neurogenic claudication     Past Surgical History:  Procedure Laterality Date  . BACK SURGERY    . BREAST BIOPSY Left    negative  . CATARACT EXTRACTION W/ INTRAOCULAR LENS  IMPLANT, BILATERAL Bilateral   . COLONOSCOPY  08/17/2007   Adenomatous Polyps  . COLONOSCOPY  10/09/2010   PH Adenomatous polyps : [facility] 09/2015 ; Recall Ltr mailed 08/17/2015 (dw)  . ESOPHAGOGASTRODUODENOSCOPY  01/14/2008   No repeat per RTE  . FINGER ARTHROPLASTY  Right 09/15/2014   Procedure: RIGHT MIDDLE FINGER, RING FINGER, SMALL FINGER EXTENSOR DIGITORUM COMMUMIS STABILIZATION WITH RIGHT MIDDLE FINGER MCP ARTHROPLASTY, POSSIBLE RING FINGER AND SMALL FINGER MCP ARTHROPLASTY;  Surgeon: Roseanne Kaufman, MD;  Location: [facility];  Service: Orthopedics;  Laterality: Right;  . FOOT SURGERY Bilateral   . HAND TENDON SURGERY Right 09/15/2014   "d/t RA"  . HARDWARE REMOVAL Right 04/16/2017   Procedure: HARDWARE REMOVAL;  Surgeon: Dereck Leep, MD;  Location: [facility];  Service: Orthopedics;  Laterality: Right;  . HUMERUS FRACTURE SURGERY Left   . INCISION AND DRAINAGE OF WOUND Right 04/16/2017   Procedure: IRRIGATION AND DEBRIDEMENT WOUND;  Surgeon: Wallace Going, DO;  Location: [facility];  Service: Plastics;  Laterality: Right;  . INTRAMEDULLARY (IM) NAIL INTERTROCHANTERIC Right 02/19/2017   Procedure: INTRAMEDULLARY (IM) NAIL INTERTROCHANTRIC;  Surgeon: Dereck Leep, MD;  Location: [facility];  Service: Orthopedics;  Laterality: Right;  . KYPHOPLASTY N/A 01/04/2013   Procedure: L1 Kyphoplasty;  Surgeon: Hosie Spangle, MD;  Location: [place] [facility];  Service: Neurosurgery;  Laterality: N/A;  L1 Kyphoplasty  . KYPHOPLASTY N/A 02/07/2014   Procedure: THORACIC SIX KYPHOPLASTY;  Surgeon: Hosie Spangle, MD;  [facility];  Service:  Neurosurgery;  Laterality: N/A;  T6 Kyphoplasty   . KYPHOPLASTY N/A 02/08/2016   Procedure: KYPHOPLASTY  T-12;  Surgeon: Hessie Knows, MD;  Location: [facility];  Service: Orthopedics;  Laterality: N/A;  . KYPHOPLASTY N/A 10/01/2016   Procedure: KYPHOPLASTY  L-5;  Surgeon: Hessie Knows, MD;  Location: [facility];  Service: Orthopedics;  Laterality: N/A;  . LUMBAR [place] SURGERY  2014    X 3  . LUMBAR LAMINECTOMY    . ORIF ELBOW FRACTURE Right 02/19/2017   Procedure: OPEN REDUCTION INTERNAL FIXATION (ORIF) ELBOW/OLECRANON FRACTURE;  Surgeon: Dereck Leep, MD;  Location: [facility];  Service: Orthopedics;  Laterality: Right;  .  PERIPHERAL VASCULAR CATHETERIZATION Left 12/07/2015   Procedure: Upper Extremity Angiography;  Surgeon: Algernon Huxley, MD;  Location: [facility];  Service: Cardiovascular;  Laterality: Left;  . PERIPHERAL VASCULAR CATHETERIZATION  12/07/2015   Procedure: Upper Extremity Intervention;  Surgeon: Algernon Huxley, MD;  Location: [facility];  Service: Cardiovascular;;  . POSTERIOR LUMBAR FUSION     "got screws in"  . TONSILLECTOMY    . TUBAL LIGATION    . WRIST FUSION Right    30 yrs ago   Family History:  Family History  Problem Relation Age of Onset  . CAD Father   . Alcohol abuse Father   . Heart disease Father   . CAD Mother   . Hypertension Mother   . Peripheral vascular disease Mother   . Osteoarthritis Mother   . Heart disease Mother   . Breast cancer Maternal Aunt 23  . Alcohol abuse Sister   . Arthritis Sister   . Cancer Sister    Family Psychiatric  History: Does not know of any Social History:  History  Alcohol Use No     History  Drug Use No    Social History   Social History  . Marital status: Married    Spouse name: N/A  . Number of children: 2  . Years of education: 12   Occupational History  . retired    Social History Main Topics  . Smoking status: Former Smoker    Packs/day: 1.00    Years: 56.00    Types: Cigarettes    Quit date: 07/25/2015  . Smokeless tobacco: Never Used  . Alcohol use No  . Drug use: No  . Sexual activity: Yes   Other Topics Concern  . None   Social History Narrative   Lives with husband at home   Additional Social History:    Allergies:   Allergies  Allergen Reactions  . Ambien [Zolpidem Tartrate] Other (See Comments)    Over sedation   . Dilaudid [Hydromorphone Hcl] Other (See Comments)    Made tongue swell  . Gold-Containing Drug Products Anaphylaxis  . Adhesive [Tape] Other (See Comments)    Tears skin  . Haldol [Haloperidol] Other (See Comments)    Hallucinations    Labs:  Results for  orders placed or performed during the hospital encounter of 05/09/17 (from the past 48 hour(s))  Basic metabolic panel     Status: Abnormal   Collection Time: 05/12/17  5:27 AM  Result Value Ref Range   Sodium 137 135 - 145 mmol/L   Potassium 4.1 3.5 - 5.1 mmol/L   Chloride 107 101 - 111 mmol/L   CO2 24 22 - 32 mmol/L   Glucose, Bld 99 65 - 99 mg/dL   BUN 16 6 - 20 mg/dL   Creatinine, Ser 0.79  0.44 - 1.00 mg/dL   Calcium 8.7 (L) 8.9 - 10.3 mg/dL   GFR calc non Af Amer >60 >60 mL/min   GFR calc Af Amer >60 >60 mL/min    Comment: (NOTE) The eGFR has been calculated using the CKD EPI equation. This calculation has not been validated in all clinical situations. eGFR's persistently <60 mL/min signify possible Chronic Kidney Disease.    Anion gap 6 5 - 15  CBC     Status: Abnormal   Collection Time: 05/13/17  5:44 AM  Result Value Ref Range   WBC 11.3 (H) 3.6 - 11.0 K/uL   RBC 3.81 3.80 - 5.20 MIL/uL   Hemoglobin 10.8 (L) 12.0 - 16.0 g/dL   HCT 33.4 (L) 35.0 - 47.0 %   MCV 87.7 80.0 - 100.0 fL   MCH 28.5 26.0 - 34.0 pg   MCHC 32.5 32.0 - 36.0 g/dL   RDW 17.9 (H) 11.5 - 14.5 %   Platelets 774 (H) 150 - 440 K/uL    Current Facility-Administered Medications  Medication Dose Route Frequency Provider Last Rate Last Dose  . acetaminophen (TYLENOL) tablet 650 mg  650 mg Oral Q6H PRN Dustin Flock, MD       Or  . acetaminophen (TYLENOL) suppository 650 mg  650 mg Rectal Q6H PRN Dustin Flock, MD      . acetaminophen (TYLENOL) tablet 1,000 mg  1,000 mg Oral Daily PRN Dustin Flock, MD      . albuterol (PROVENTIL) (2.5 MG/3ML) 0.083% nebulizer solution 2.5 mg  2.5 mg Inhalation Q6H PRN Dustin Flock, MD      . calcium-vitamin D (OSCAL WITH D) 500-200 MG-UNIT per tablet 2 tablet  2 tablet Oral Q breakfast Dustin Flock, MD   2 tablet at 05/13/17 [phone]  . cholecalciferol (VITAMIN D) tablet 4,000 Units  4,000 Units Oral Daily Dustin Flock, MD   4,000 Units at 05/13/17 [phone]  .  ciprofloxacin (CIPRO) tablet 500 mg  500 mg Oral BID Loletha Grayer, MD   500 mg at 05/13/17 9935  . DULoxetine (CYMBALTA) DR capsule 30 mg  30 mg Oral QHS Dustin Flock, MD   30 mg at 05/12/17 2029  . DULoxetine (CYMBALTA) DR capsule 60 mg  60 mg Oral Dellie Catholic, MD   60 mg at 05/13/17 0951  . enoxaparin (LOVENOX) injection 40 mg  40 mg Subcutaneous Q24H Dustin Flock, MD   40 mg at 05/12/17 2028  . feeding supplement (ENSURE ENLIVE) (ENSURE ENLIVE) liquid 237 mL  237 mL Oral TID BM Loletha Grayer, MD   237 mL at 05/13/17 1521  . ferrous sulfate tablet 325 mg  325 mg Oral BID WC Dustin Flock, MD   325 mg at 05/13/17 0952  . fluticasone furoate-vilanterol (BREO ELLIPTA) 100-25 MCG/INH 1 puff  1 puff Inhalation q morning - 10a Dustin Flock, MD   1 puff at 05/13/17 0951  . folic acid (FOLVITE) tablet 1 mg  1 mg Oral Daily Dustin Flock, MD   1 mg at 05/13/17 7017  . HYDROcodone-acetaminophen (NORCO/VICODIN) 5-325 MG per tablet 1 tablet  1 tablet Oral Q6H PRN Loletha Grayer, MD   1 tablet at 05/12/17 1303  . Influenza vac split quadrivalent PF (FLUZONE HIGH-DOSE) injection 0.5 mL  0.5 mL Intramuscular Tomorrow-1000 Fritzi Mandes, MD      . metoprolol tartrate (LOPRESSOR) tablet 25 mg  25 mg Oral BID Dustin Flock, MD   25 mg at 05/13/17 7939  . morphine (MS CONTIN) 12 hr  tablet 30 mg  30 mg Oral Q12H Clapacs, John T, MD      . multivitamin with minerals tablet 1 tablet  1 tablet Oral Daily Loletha Grayer, MD   1 tablet at 05/13/17 [phone]  . OLANZapine (ZYPREXA) tablet 5 mg  5 mg Oral QHS Clapacs, John T, MD      . ondansetron (ZOFRAN) tablet 4 mg  4 mg Oral Q6H PRN Dustin Flock, MD       Or  . ondansetron (ZOFRAN) injection 4 mg  4 mg Intravenous Q6H PRN Dustin Flock, MD      . pantoprazole (PROTONIX) EC tablet 40 mg  40 mg Oral Daily Dustin Flock, MD   40 mg at 05/13/17 5462  . pantoprazole (PROTONIX) EC tablet 40 mg  40 mg Oral BID AC Wieting, Richard, MD        . polyethylene glycol (MIRALAX / GLYCOLAX) packet 17 g  17 g Oral Daily PRN Dustin Flock, MD      . QUEtiapine (SEROQUEL) tablet 25 mg  25 mg Oral QHS Loletha Grayer, MD   25 mg at 05/12/17 2028  . senna-docusate (Senokot-S) tablet 1 tablet  1 tablet Oral BID Loletha Grayer, MD   1 tablet at 05/12/17 2029  . vitamin C (ASCORBIC ACID) tablet 500 mg  500 mg Oral BID Loletha Grayer, MD   500 mg at 05/13/17 7035    Musculoskeletal: Strength & Muscle Tone: decreased Gait & Station: unsteady Patient leans: N/A  Psychiatric Specialty Exam: Physical Exam  Nursing note and vitals reviewed. Constitutional: She appears well-developed and well-nourished.  HENT:  Head: Normocephalic and atraumatic.  Eyes: Pupils are equal, round, and reactive to light. Conjunctivae are normal.  Neck: Normal range of motion.  Cardiovascular: Normal heart sounds.   Respiratory: Effort normal. No respiratory distress.  GI: Soft.  Musculoskeletal: Normal range of motion.       Arms: Neurological: She is alert.  Skin: Skin is warm and dry.     Psychiatric: Her mood appears anxious. Her affect is blunt. Her speech is tangential. She is slowed. Thought content is not paranoid. Cognition and memory are impaired. She expresses no homicidal and no suicidal ideation. She is noncommunicative. She exhibits abnormal recent memory.    Review of Systems  Constitutional: Negative.   HENT: Negative.   Eyes: Negative.   Respiratory: Negative.   Cardiovascular: Negative.   Gastrointestinal: Positive for constipation.  Musculoskeletal: Positive for back pain and joint pain.  Skin: Negative.   Neurological: Negative.   Psychiatric/Behavioral: Positive for hallucinations and memory loss. Negative for depression, substance abuse and suicidal ideas. The patient is nervous/anxious and has insomnia.     Blood pressure (!) 153/74, pulse 80, temperature 98.7 F (37.1 C), temperature source Oral, resp. rate 16, height 5'  2" (1.575 m), weight 56.7 kg (125 lb), SpO2 96 %.Body mass index is 22.86 kg/m.  General Appearance: Disheveled  Eye Contact:  Minimal  Speech:  Slow  Volume:  Decreased  Mood:  Euthymic  Affect:  Constricted  Thought Process:  Disorganized  Orientation:  Negative  Thought Content:  Illogical, Rumination and Tangential  Suicidal Thoughts:  No  Homicidal Thoughts:  No  Memory:  Immediate;   Fair Recent;   Poor Remote;   Poor  Judgement:  Impaired  Insight:  Lacking  Psychomotor Activity:  Decreased  Concentration:  Concentration: Poor  Recall:  Poor  Fund of Knowledge:  Poor  Language:  Poor  Akathisia:  No  Handed:  Right  AIMS (if indicated):     Assets:  Housing Social Support  ADL's:  Impaired  Cognition:  Impaired,  Moderate  Sleep:        Treatment Plan Summary: Daily contact with patient to assess and evaluate symptoms and progress in treatment, Medication management and Plan Patient currently presents as quietly delirious although she is at high risk for sundowning. The low dose of Seroquel is much less than what she was taking previously. It appears that both her Seroquel and her oxymorphone have been dramatically decreased over the last few days. Husband swears that on the full doses of these that she was fully functional and ambulatory at home. He is very concerned about her current condition. He insists that he does not believe that she is able to ambulate safely independently right now. She also is not eating very well. Husband believes she is at high risk to come right back into the hospital if discharged home and would prefer her to go to rehabilitation or some other kind of inpatient or to stay in the hospital a short while longer. I told him I had no direct control over that. As far however is treating the delirium I think that the current 25 mg of Seroquel seems to be inadequate. If there was really concerned about oversedation from the larger dose of Seroquel I  will change her to Zyprexa 5 mg at night which should be less sedating but hopefully equally effective. I am also going to try putting her back on her oxymorphone although here at the hospital we only have the equivalent MS Contin. Husband insists that it was not over sedating her at home. I also have added Colace for her chronic constipation. I will follow-up as she is in the hospital.  Disposition: Patient does not meet criteria for psychiatric inpatient admission. Supportive therapy provided about ongoing stressors. Discussed crisis plan, support from social network, calling 911, coming to the Emergency Department, and calling Suicide Hotline.  Alethia Berthold, MD 05/13/2017 4:57 PM

## 2017-05-13 NOTE — Progress Notes (Signed)
PT Cancellation Note  Patient Details Name: Michelle Cabrera MRN: [id number] DOB: March 09, 1946   Cancelled Treatment:    Reason Eval/Treat Not Completed: Other (comment) Chart reviewed, pt refused PT, reports feeling too tired and nauseas. Pt appeared confused, speech not making sense. Will re-attempt at next available date.   Renford Dills, SPT Renford Dills 05/13/2017, 3:04 PM

## 2017-05-14 ENCOUNTER — Inpatient Hospital Stay: Payer: Medicare Other

## 2017-05-14 DIAGNOSIS — R112 Nausea with vomiting, unspecified: Secondary | ICD-10-CM

## 2017-05-14 LAB — CBC
HEMATOCRIT: 33.1 % — AB (ref 35.0–47.0)
HEMOGLOBIN: 11 g/dL — AB (ref 12.0–16.0)
MCH: 29.1 pg (ref 26.0–34.0)
MCHC: 33.3 g/dL (ref 32.0–36.0)
MCV: 87.4 fL (ref 80.0–100.0)
PLATELETS: 715 10*3/uL — AB (ref 150–440)
RBC: 3.79 MIL/uL — AB (ref 3.80–5.20)
RDW: 17.9 % — ABNORMAL HIGH (ref 11.5–14.5)
WBC: 11.8 10*3/uL — AB (ref 3.6–11.0)

## 2017-05-14 LAB — BASIC METABOLIC PANEL
ANION GAP: 8 (ref 5–15)
BUN: 10 mg/dL (ref 6–20)
CHLORIDE: 104 mmol/L (ref 101–111)
CO2: 24 mmol/L (ref 22–32)
CREATININE: 0.56 mg/dL (ref 0.44–1.00)
Calcium: 8.2 mg/dL — ABNORMAL LOW (ref 8.9–10.3)
GFR calc non Af Amer: >60 mL/min (ref >60)
Glucose, Bld: 150 mg/dL — ABNORMAL HIGH (ref 65–99)
POTASSIUM: 3.3 mmol/L — AB (ref 3.5–5.1)
SODIUM: 136 mmol/L (ref 135–145)

## 2017-05-14 LAB — MAGNESIUM: Magnesium: 1.4 mg/dL — ABNORMAL LOW (ref 1.7–2.4)

## 2017-05-14 MED ORDER — MAGNESIUM SULFATE 2 GM/50ML IV SOLN
2.0000 g | Freq: Once | INTRAVENOUS | Status: AC
  Administered 2017-05-14: 2 g via INTRAVENOUS
  Filled 2017-05-14: qty 50

## 2017-05-14 MED ORDER — IOPAMIDOL (ISOVUE-300) INJECTION 61%
15.0000 mL | INTRAVENOUS | Status: AC
  Administered 2017-05-14 (×2): 15 mL via ORAL

## 2017-05-14 MED ORDER — POTASSIUM CHLORIDE CRYS ER 20 MEQ PO TBCR
40.0000 meq | EXTENDED_RELEASE_TABLET | Freq: Once | ORAL | Status: AC
  Administered 2017-05-14: 13:00:00 40 meq via ORAL
  Filled 2017-05-14: qty 2

## 2017-05-14 MED ORDER — IOPAMIDOL (ISOVUE-300) INJECTION 61%
100.0000 mL | Freq: Once | INTRAVENOUS | Status: AC | PRN
  Administered 2017-05-14: 100 mL via INTRAVENOUS

## 2017-05-14 NOTE — Progress Notes (Signed)
Occupational Therapy Treatment Patient Details Name: Michelle Cabrera MRN: [id number] DOB: [birth year] Today's Date: 05/14/2017    History of present illness Pt. is a 71 y.o female who was admitted to [facility] with UTI, and confusion. Pt. was recently admitted for a decubitus ulcer over olecranon with exposure of orthopedic hardware treatment of right arm wound.  pt. PMHx includes: Anxiety, chronic low back pain, CPD, Depression, wound vac in Right UE, cervicalgia, Collagen vascular disorder, COPD, DDD, Depression, DM, RA, and Schizophrenia Spectrum Disorder   OT comments  Pt. continues to present with weakness, fatigue, confusion, and limited functional mobility which hinder pt.'s ability to complete ADLs, and IADL tasks. Pt. tolerated ROM to the LUE. Pt. performed hand-to-face patterns with hand over hand assist in preparation for light grooming tasks. Pt. Was assisted with repositioning. Pt. continues to benefit from OT services to improve engagement in ADL, and IADL tasks and work towards returning to her PLOF.   Follow Up Recommendations  SNF    Equipment Recommendations       Recommendations for Other Services      Precautions / Restrictions Precautions Precautions: Fall;Other (comment) (R elbow) Restrictions Weight Bearing Restrictions: No              ADL either performed or assessed with clinical judgement   ADL Overall ADL's : Needs assistance/impaired     Grooming: Maximal assistance                                 General ADL Comments: Pt. performed hand to face patterns using the left hand with hand-over-hand assist in preparation for light grooming tasks.     Vision       Perception     Praxis      Cognition Arousal/Alertness: Awake/alert Behavior During Therapy: WFL for tasks assessed/performed Overall Cognitive Status: Impaired/Different from baseline                                          Exercises   Shoulder  Instructions       General Comments      Pertinent Vitals/ Pain       Pain Assessment: Faces Pain Score: 6  Faces Pain Scale: Hurts even more Pain Intervention(s): Limited activity within patient's tolerance;Monitored during session;Repositioned                                                          Frequency  Min 1X/week        Progress Toward Goals  OT Goals(current goals can now be found in the care plan section)     Acute Rehab OT Goals Patient Stated Goal: to get stronger  Plan      Co-evaluation                 AM-PAC PT "6 Clicks" Daily Activity     Outcome Measure   Help from another person eating meals?: A Lot Help from another person taking care of personal grooming?: A Lot Help from another person toileting, which includes using toliet, bedpan, or urinal?: Total Help from another person bathing (including washing, rinsing, drying)?: Total  Help from another person to put on and taking off regular upper body clothing?: Total Help from another person to put on and taking off regular lower body clothing?: Total 6 Click Score: 8    End of Session    OT Visit Diagnosis: Muscle weakness (generalized) (M62.81)   Activity Tolerance Patient tolerated treatment well   Patient Left in bed;with call bell/phone within reach;with family/visitor present;with bed alarm set   Nurse Communication      Functional Limitation: Self care Self Care Current Status (S0630): At least 80 percent but less than 100 percent impaired, limited or restricted Self Care Goal Status (Z6010): At least 40 percent but less than 60 percent impaired, limited or restricted   Time: 1335-1355 OT Time Calculation (min): 20 min  Charges: OT G-codes **NOT FOR INPATIENT CLASS** Functional Limitation: Self care Self Care Current Status (X3235): At least 80 percent but less than 100 percent impaired, limited or restricted Self Care Goal Status (T7322): At  least 40 percent but less than 60 percent impaired, limited or restricted OT General Charges $OT Visit: 1 Visit OT Treatments $Self Care/Home Management : 8-22 mins  [name], MS, OTR/L    [name], MS, OTR/L 05/14/2017, 2:09 PM

## 2017-05-14 NOTE — Consult Note (Signed)
[name] MD, MRCP(U.K) [address]  [address]  [place], [place] [address]  Main: [phone]  Fax: [phone]  Consultation  Referring Provider:    Dr Hilton Sinclair Primary Care Physician:  Lauro Regulus, MD Primary Gastroenterologist:None         Reason for Consultation:     Unable to eat   Date of Admission:  05/09/2017 Date of Consultation:  05/14/2017         HPI:   Michelle Cabrera is a 71 y.o. female admitted 5 days back with UTI , delirium . I have been consulted as the patient has not been eating and been throwing up.   She says it has been ongoing for more than a week but since this morning she has felt much better and has been able to keep down lunch and dinner. No abdominal pain. CT chest abdomen and pelvis show no acute findings, no bowel obstruction .Ct head shows no acute abnormalities.   She was in the company of her pastor,son and husband whenI went into visit her and they too felt she is better.   Magnesium low 1.4   Past Medical History:  Diagnosis Date  . Anxiety   . Arthritis   . Cataract   . Cervicalgia   . Chronic lower back pain   . Closed compression fracture of L1 lumbar vertebra (HCC) 11/2012  . Collagen vascular disease (HCC)   . COPD (chronic obstructive pulmonary disease) (HCC)    unspecified  . DDD (degenerative disc disease)   . Depression   . Diabetes mellitus (HCC)   . Disorder of bursae and tendons in shoulder region    unspecified  . Fall 09/27/2016   Fall Feb 04, 2016 with broken vertebra  . GERD (gastroesophageal reflux disease)   . Headache   . Hereditary and idiopathic peripheral neuropathy    unspecified  . History of adenomatous polyp of colon    Followed by Dr. Markham Jordan  . History of tubal ligation   . Hypertension   . Intermediate coronary syndrome (HCC)   . Lumbago   . Neuralgia and neuritis, unspecified    radiculitis unspecified  . Rheumatoid arthritis (HCC)   . Rheumatoid arthritis (HCC)   . Senile  osteoporosis   . Spinal stenosis, lumbar region without neurogenic claudication     Past Surgical History:  Procedure Laterality Date  . BACK SURGERY    . BREAST BIOPSY Left    negative  . CATARACT EXTRACTION W/ INTRAOCULAR LENS  IMPLANT, BILATERAL Bilateral   . COLONOSCOPY  08/17/2007   Adenomatous Polyps  . COLONOSCOPY  10/09/2010   PH Adenomatous polyps : [facility] 09/2015 ; Recall Ltr mailed 08/17/2015 (dw)  . ESOPHAGOGASTRODUODENOSCOPY  01/14/2008   No repeat per RTE  . FINGER ARTHROPLASTY Right 09/15/2014   Procedure: RIGHT MIDDLE FINGER, RING FINGER, SMALL FINGER EXTENSOR DIGITORUM COMMUMIS STABILIZATION WITH RIGHT MIDDLE FINGER MCP ARTHROPLASTY, POSSIBLE RING FINGER AND SMALL FINGER MCP ARTHROPLASTY;  Surgeon: Dominica Severin, MD;  Location: [facility];  Service: Orthopedics;  Laterality: Right;  . FOOT SURGERY Bilateral   . HAND TENDON SURGERY Right 09/15/2014   "d/t RA"  . HARDWARE REMOVAL Right 04/16/2017   Procedure: HARDWARE REMOVAL;  Surgeon: Donato Heinz, MD;  Location: [facility];  Service: Orthopedics;  Laterality: Right;  . HUMERUS FRACTURE SURGERY Left   . INCISION AND DRAINAGE OF WOUND Right 04/16/2017   Procedure: IRRIGATION AND DEBRIDEMENT WOUND;  Surgeon: Peggye Form, DO;  Location:  [facility];  Service: Plastics;  Laterality: Right;  . INTRAMEDULLARY (IM) NAIL INTERTROCHANTERIC Right 02/19/2017   Procedure: INTRAMEDULLARY (IM) NAIL INTERTROCHANTRIC;  Surgeon: Donato Heinz, MD;  Location: [facility];  Service: Orthopedics;  Laterality: Right;  . KYPHOPLASTY N/A 01/04/2013   Procedure: L1 Kyphoplasty;  Surgeon: Hewitt Shorts, MD;  Location: [facility];  Service: Neurosurgery;  Laterality: N/A;  L1 Kyphoplasty  . KYPHOPLASTY N/A 02/07/2014   Procedure: THORACIC SIX KYPHOPLASTY;  Surgeon: Hewitt Shorts, MD;  Location: [facility];  Service: Neurosurgery;  Laterality: N/A;  T6 Kyphoplasty   . KYPHOPLASTY N/A 02/08/2016   Procedure: KYPHOPLASTY  T-12;  Surgeon:  Kennedy Bucker, MD;  Location: [facility];  Service: Orthopedics;  Laterality: N/A;  . KYPHOPLASTY N/A 10/01/2016   Procedure: KYPHOPLASTY  L-5;  Surgeon: Kennedy Bucker, MD;  Location: [facility];  Service: Orthopedics;  Laterality: N/A;  . LUMBAR DISC SURGERY  2014    X 3  . LUMBAR LAMINECTOMY    . ORIF ELBOW FRACTURE Right 02/19/2017   Procedure: OPEN REDUCTION INTERNAL FIXATION (ORIF) ELBOW/OLECRANON FRACTURE;  Surgeon: Donato Heinz, MD;  Location: [facility];  Service: Orthopedics;  Laterality: Right;  . PERIPHERAL VASCULAR CATHETERIZATION Left 12/07/2015   Procedure: Upper Extremity Angiography;  Surgeon: Annice Needy, MD;  Location: [facility];  Service: Cardiovascular;  Laterality: Left;  . PERIPHERAL VASCULAR CATHETERIZATION  12/07/2015   Procedure: Upper Extremity Intervention;  Surgeon: Annice Needy, MD;  Location: [facility];  Service: Cardiovascular;;  . POSTERIOR LUMBAR FUSION     "got screws in"  . TONSILLECTOMY    . TUBAL LIGATION    . WRIST FUSION Right    30 yrs ago    Prior to Admission medications   Medication Sig Start Date End Date Taking? Authorizing Provider  albuterol (PROVENTIL HFA;VENTOLIN HFA) 108 (90 Base) MCG/ACT inhaler Inhale 2 puffs into the lungs every 6 (six) hours as needed for wheezing or shortness of breath.   Yes [provider]  aspirin EC 81 MG tablet Take 81 mg by mouth daily.   Yes [provider]  Calcium Carbonate-Vitamin D 600-400 MG-UNIT tablet Take 1 tablet by mouth daily.    Yes [provider]  DULoxetine (CYMBALTA) 30 MG capsule Take 30 mg by mouth at bedtime. *Note dose*   Yes [provider]  DULoxetine (CYMBALTA) 60 MG capsule Take 60 mg by mouth every morning. *Note dose*   Yes [provider]  etanercept (ENBREL SURECLICK) 50 MG/ML injection Inject 50 mg into the skin once a week. Fridays   Yes [provider]  ferrous sulfate 325 (65 FE) MG tablet Take 1 tablet (325 mg  total) by mouth 2 (two) times daily with a meal. 02/21/17  Yes Altamese Dilling, MD  Fluticasone Furoate-Vilanterol (BREO ELLIPTA) 100-25 MCG/INH AEPB Inhale 1 puff into the lungs every morning.   Yes [provider]  folic acid (FOLVITE) 1 MG tablet Take 1 mg by mouth daily.   Yes [provider]  METHOTREXATE SODIUM, PF, IJ Inject 0.8 mLs into the skin once a week. Methotrexate sodium solution (25 mg/ ml ) Give 0.8 ml on Friday.   Yes [provider]  metoprolol tartrate (LOPRESSOR) 25 MG tablet Take 25 mg by mouth 2 (two) times daily.   Yes [provider]  Nutritional Supplements (ENSURE ENLIVE PO) Take 1 Bottle by mouth 2 (two) times daily between meals.   Yes [provider]  omeprazole (PRILOSEC) 20 MG capsule Take 20 mg by mouth 2 (two) times daily.    Yes [provider]  QUEtiapine (SEROQUEL) 100 MG tablet Take 1.5 tablets (150 mg total) by mouth at bedtime. 11/11/15  Yes Gouru, Deanna Artis, MD  vitamin C (ASCORBIC ACID) 500 MG tablet Take 500 mg by mouth daily.   Yes [provider]  acetaminophen (TYLENOL) 500 MG tablet Take 1,000 mg by mouth daily as needed for mild pain or headache.     [provider]  bisacodyl (DULCOLAX) 10 MG suppository Place 1 suppository (10 mg total) rectally daily as needed for moderate constipation. 02/21/17   Altamese Dilling, MD  enoxaparin (LOVENOX) 40 MG/0.4ML injection Inject 0.4 mLs (40 mg total) into the skin daily. 02/22/17 03/08/17  Altamese Dilling, MD  gabapentin (NEURONTIN) 300 MG capsule Take 600 mg by mouth 2 (two) times daily as needed (restless legs).     [provider]  HYDROcodone-acetaminophen (NORCO) 5-325 MG tablet Take 1 tablet by mouth every 4 (four) hours as needed for moderate pain. Patient not taking: Reported on 05/09/2017 04/16/17   Donato Heinz, MD  ibuprofen (ADVIL,MOTRIN) 200 MG tablet Take 400 mg by mouth every 8 (eight) hours as needed for  mild pain. Reported on 12/07/2015    [provider]  oxymorphone (OPANA) 5 MG tablet Take 1 tablet (5 mg total) by mouth every 4 (four) hours as needed for pain. 03/06/17   Lorenso Quarry, NP  polyethylene glycol (MIRALAX / GLYCOLAX) packet Take 17 g by mouth daily as needed for mild constipation.     [provider]  senna (SENOKOT) 8.6 MG tablet Take 1 tablet by mouth daily as needed for constipation.     [provider]  trolamine salicylate (ASPERCREME) 10 % cream Apply 1 application topically 2 (two) times daily as needed for muscle pain.    [provider]    Family History  Problem Relation Age of Onset  . CAD Father   . Alcohol abuse Father   . Heart disease Father   . CAD Mother   . Hypertension Mother   . Peripheral vascular disease Mother   . Osteoarthritis Mother   . Heart disease Mother   . Breast cancer Maternal Aunt 50  . Alcohol abuse Sister   . Arthritis Sister   . Cancer Sister      Social History  Substance Use Topics  . Smoking status: Former Smoker    Packs/day: 1.00    Years: 56.00    Types: Cigarettes    Quit date: 07/25/2015  . Smokeless tobacco: Never Used  . Alcohol use No    Allergies as of 05/09/2017 - Review Complete 05/09/2017  Allergen Reaction Noted  . Ambien [zolpidem tartrate] Other (See Comments) 02/05/2016  . Dilaudid [hydromorphone hcl] Other (See Comments) 12/26/2012  . Gold-containing drug products Anaphylaxis 09/14/2014  . Adhesive [tape] Other (See Comments) 02/02/2014  . Haldol [haloperidol] Other (See Comments) 02/04/2016    Review of Systems:    All systems reviewed and negative except where noted in HPI.   Physical Exam:  Vital signs in last 24 hours: Temp:  [98.1 F (36.7 C)-98.4 F (36.9 C)] 98.1 F (36.7 C) (10/17 0807) Pulse Rate:  [87-95] 92 (10/17 1317) Resp:  [17-18] 17 (10/17 0807) BP: (95-139)/(57-75) 95/64 (10/17 1317) SpO2:  [90 %-97 %] 96 % (10/17 0807) Last BM Date:  05/13/17 General:   Pleasant, cooperative in NAD Head:  Normocephalic and  atraumatic. Eyes:   No icterus.   Conjunctiva pink. PERRLA. Ears:  Normal auditory acuity. Neck:  Supple; no masses or thyroidomegaly Lungs: Respirations even and unlabored. Lungs clear to auscultation bilaterally.   No wheezes, crackles, or rhonchi.  Heart:  Regular rate and rhythm;  Without murmur, clicks, rubs or gallops Abdomen:  Soft, nondistended, nontender. Normal bowel sounds. No appreciable masses or hepatomegaly.  No rebound or guarding.  Rectal:  Not performed.  Neurologic:  Alert and oriented x3;  grossly normal neurologically. Skin:  Intact without significant lesions or rashes. Cervical Nodes:  No significant cervical adenopathy. Psych:  Alert and cooperative. Normal affect.  LAB RESULTS:  Recent Labs  05/13/17 0544 05/14/17 0806  WBC 11.3* 11.8*  HGB 10.8* 11.0*  HCT 33.4* 33.1*  PLT 774* 715*   BMET  Recent Labs  05/12/17 0527 05/14/17 0806  NA 137 136  K 4.1 3.3*  CL 107 104  CO2 24 24  GLUCOSE 99 150*  BUN 16 10  CREATININE 0.79 0.56  CALCIUM 8.7* 8.2*   LFT No results for input(s): PROT, ALBUMIN, AST, ALT, ALKPHOS, BILITOT, BILIDIR, IBILI in the last 72 hours. PT/INR No results for input(s): LABPROT, INR in the last 72 hours.  STUDIES: Ct Head Wo Contrast  Result Date: 05/13/2017 CLINICAL DATA:  71 year old hypertensive female with altered mental status. Possible urinary tract infection. Initial encounter. EXAM: CT HEAD WITHOUT CONTRAST TECHNIQUE: Contiguous axial images were obtained from the base of the skull through the vertex without intravenous contrast. COMPARISON:  02/18/2017 head CT. FINDINGS: Brain: No intracranial hemorrhage or CT evidence of large acute infarct. Chronic microvascular changes. Global atrophy. No intracranial mass lesion noted on this unenhanced exam. Vascular: Vascular calcifications. Skull: No acute abnormality. Sinuses/Orbits: No acute orbital  abnormality. Visualized paranasal sinuses are clear. Other: Mastoid air cells and middle ear cavities are clear. IMPRESSION: No acute intracranial abnormality. Chronic microvascular changes. Global atrophy. Electronically Signed   By: Lacy Duverney M.D.   On: 05/13/2017 11:38   Ct Chest W Contrast  Result Date: 05/14/2017 CLINICAL DATA:  71 year old female with recent UTI. Confusion, subsequent delirium, nausea, vomiting. EXAM: CT CHEST, ABDOMEN, AND PELVIS WITH CONTRAST TECHNIQUE: Multidetector CT imaging of the chest, abdomen and pelvis was performed following the standard protocol during bolus administration of intravenous contrast. CONTRAST:  ISOVUE-300 IOPAMIDOL (ISOVUE-300) INJECTION 61% COMPARISON:  Chest radiographs 10/28/2016. Thoracic spine CT 02/04/2016. Lumbar MRI 07/13/2015. Chest CTA 02/26/2015. FINDINGS: CT CHEST FINDINGS Cardiovascular: Chronic Calcified aortic atherosclerosis. Calcified coronary artery atherosclerosis. Mild cardiomegaly. No pericardial effusion. Major mediastinal vascular structures that central mediastinal vascular structures appear patent. Mediastinum/Nodes: Negative.  No mediastinal lymphadenopathy. Lungs/Pleura: Major airways are patent. Right greater than left dependent pulmonary atelectasis. Centrilobular emphysema. Posterior basal segment lower lobe bronchiectasis (series 5, image 90). No pleural effusion or other abnormal pulmonary opacity. Musculoskeletal: Osteopenia. Chronic thoracic compression fractures of T5, T6 and T12. Augmentation of the T12 vertebral body since July 2017 with chronic retropulsion of bone. New since 02/04/2016 severe compression fractures of T8 and T10. The T10 level was mildly compressed on 10/28/2016. The T8 compression appears new since 10/28/2016, but is similarly sclerotic to the T10 level. Proximal right humerus deformity appears chronic. Similar nonacute appearing intermittent left rib fractures. CT ABDOMEN PELVIS FINDINGS  Hepatobiliary: Negative liver and gallbladder. Pancreas: Negative. Spleen: Negative. Adrenals/Urinary Tract: Chronic left adrenal gland thickening is stable since 2016. Negative right adrenal gland. Small chronic left renal cyst. Bilateral renal enhancement and contrast excretion is within  normal limits. No hydronephrosis or hydroureter. Diminutive and unremarkable urinary bladder. Stomach/Bowel: Decompressed rectosigmoid colon. Minimal sigmoid diverticular without active inflammation. Decompressed left colon. Oral contrast has reached the splenic flexure. Redundant transverse colon intermittently distended with gas and oral contrast. Abrupt caliber change in the distal transverse segment on coronal image 28 is felt to reflect bowel peristalsis. Interposed hepatic flexure between the liver and anterior diaphragm. The right colon is felt with oral contrast. Normal retrocecal appendix. Negative terminal ileum. No dilated small bowel. Negative stomach and duodenum. No abdominal free fluid or free air. Vascular/Lymphatic: Extensive Aortoiliac calcified atherosclerosis. Ectatic infrarenal abdominal aorta. Major arterial structures in the abdomen and pelvis appear patent. Portal venous system appears patent. No lymphadenopathy. Reproductive: Negative. Other: No pelvic free fluid. Trace right ventral abdominal wall subcutaneous gas (series 3, image 71) likely related to recent subcutaneous injection. Musculoskeletal: Osteopenia. Chronic left pubic rami fractures. Proximal right femur ORIF. No acute osseous abnormality identified. Chronic L1 and L5 compression fractures previously augmented. Chronic postoperative changes to the L2-L3 and L3-L4 levels. IMPRESSION: 1. No acute findings in the chest aside from dependent atelectasis. 2. Aortic Atherosclerosis (ICD10-I70.0) and Emphysema (ICD10-J43.9), and lower lobe Bronchiectasis. 3. No acute or inflammatory process identified in the abdomen or pelvis. No bowel obstruction. 4.  Chronic vertebral compression fractures with progression in the thoracic spine since April 2018. 5. Chronic proximal right humerus fracture and left rib fractures. Chronic postoperative changes to the lumbar spine. Electronically Signed   By: Odessa FlemingH  Hall M.D.   On: 05/14/2017 12:06   Ct Abdomen Pelvis W Contrast  Result Date: 05/14/2017 CLINICAL DATA:  71 year old female with recent UTI. Confusion, subsequent delirium, nausea, vomiting. EXAM: CT CHEST, ABDOMEN, AND PELVIS WITH CONTRAST TECHNIQUE: Multidetector CT imaging of the chest, abdomen and pelvis was performed following the standard protocol during bolus administration of intravenous contrast. CONTRAST:  100mL ISOVUE-300 IOPAMIDOL (ISOVUE-300) INJECTION 61% COMPARISON:  Chest radiographs 10/28/2016. Thoracic spine CT 02/04/2016. Lumbar MRI 07/13/2015. Chest CTA 02/26/2015. FINDINGS: CT CHEST FINDINGS Cardiovascular: Chronic Calcified aortic atherosclerosis. Calcified coronary artery atherosclerosis. Mild cardiomegaly. No pericardial effusion. Major mediastinal vascular structures that central mediastinal vascular structures appear patent. Mediastinum/Nodes: Negative.  No mediastinal lymphadenopathy. Lungs/Pleura: Major airways are patent. Right greater than left dependent pulmonary atelectasis. Centrilobular emphysema. Posterior basal segment lower lobe bronchiectasis (series 5, image 90). No pleural effusion or other abnormal pulmonary opacity. Musculoskeletal: Osteopenia. Chronic thoracic compression fractures of T5, T6 and T12. Augmentation of the T12 vertebral body since July 2017 with chronic retropulsion of bone. New since 02/04/2016 severe compression fractures of T8 and T10. The T10 level was mildly compressed on 10/28/2016. The T8 compression appears new since 10/28/2016, but is similarly sclerotic to the T10 level. Proximal right humerus deformity appears chronic. Similar nonacute appearing intermittent left rib fractures. CT ABDOMEN PELVIS  FINDINGS Hepatobiliary: Negative liver and gallbladder. Pancreas: Negative. Spleen: Negative. Adrenals/Urinary Tract: Chronic left adrenal gland thickening is stable since 2016. Negative right adrenal gland. Small chronic left renal cyst. Bilateral renal enhancement and contrast excretion is within normal limits. No hydronephrosis or hydroureter. Diminutive and unremarkable urinary bladder. Stomach/Bowel: Decompressed rectosigmoid colon. Minimal sigmoid diverticular without active inflammation. Decompressed left colon. Oral contrast has reached the splenic flexure. Redundant transverse colon intermittently distended with gas and oral contrast. Abrupt caliber change in the distal transverse segment on coronal image 28 is felt to reflect bowel peristalsis. Interposed hepatic flexure between the liver and anterior diaphragm. The right colon is felt with oral contrast. Normal retrocecal appendix.  Negative terminal ileum. No dilated small bowel. Negative stomach and duodenum. No abdominal free fluid or free air. Vascular/Lymphatic: Extensive Aortoiliac calcified atherosclerosis. Ectatic infrarenal abdominal aorta. Major arterial structures in the abdomen and pelvis appear patent. Portal venous system appears patent. No lymphadenopathy. Reproductive: Negative. Other: No pelvic free fluid. Trace right ventral abdominal wall subcutaneous gas (series 3, image 71) likely related to recent subcutaneous injection. Musculoskeletal: Osteopenia. Chronic left pubic rami fractures. Proximal right femur ORIF. No acute osseous abnormality identified. Chronic L1 and L5 compression fractures previously augmented. Chronic postoperative changes to the L2-L3 and L3-L4 levels. IMPRESSION: 1. No acute findings in the chest aside from dependent atelectasis. 2. Aortic Atherosclerosis (ICD10-I70.0) and Emphysema (ICD10-J43.9), and lower lobe Bronchiectasis. 3. No acute or inflammatory process identified in the abdomen or pelvis. No bowel  obstruction. 4. Chronic vertebral compression fractures with progression in the thoracic spine since April 2018. 5. Chronic proximal right humerus fracture and left rib fractures. Chronic postoperative changes to the lumbar spine. Electronically Signed   By: Odessa Fleming M.D.   On: 05/14/2017 12:06      Impression / Plan:   Michelle Cabrera is a 71 y.o. y/o female admitted with delerium and UTI. I have been consulted for inability of food intake with vomiting. Ct scan of the abdomen shows no acute abnormality and since the imaging she has been keeping her food down.    Plan  1. Correct electrolytes, Mg 1.4  2. Advance diet , if vomiting recurs then keep NPO and will plan for EGD tomorrow , please inform me if vomiting recurs.   I have discussed the plan with Dr Renae Gloss   Thank you for involving me in the care of this patient.      LOS: 5 days   [name], MD  05/14/2017, 1:54 PM

## 2017-05-14 NOTE — Progress Notes (Signed)
Patient ID: Michelle Cabrera, female   DOB: 1945-08-04, 71 y.o.   MRN: [id number]     [facility] PROGRESS NOTE  Michelle Cabrera:[id number] DOB: [birth year] DOA: 05/09/2017 PCP: Lauro Regulus, MD  HPI/Subjective: Patient more alert.  Family states she still not eating. No further hallucinations. Son states patient chronically has pain and is taking of pain medications at home.  Objective: Vitals:   05/14/17 1317 05/14/17 1433  BP: 95/64 (!) 96/53  Pulse: 92 99  Resp:    Temp:  99.1 F (37.3 C)  SpO2:  96%    Filed Weights   05/09/17 1516  Weight: 56.7 kg (125 lb)    ROS: Review of Systems  Constitutional: Negative for fever.  Respiratory: Negative for cough and shortness of breath.   Cardiovascular: Negative for chest pain.  Gastrointestinal: Positive for nausea and vomiting. Negative for abdominal pain, constipation and diarrhea.  Musculoskeletal: Positive for joint pain.   Exam: Physical Exam  HENT:  Nose: No mucosal edema.  Mouth/Throat: No oropharyngeal exudate or posterior oropharyngeal edema.  Eyes: Pupils are equal, round, and reactive to light. Conjunctivae, EOM and lids are normal.  Neck: No JVD present. Carotid bruit is not present. No edema present. No thyroid mass and no thyromegaly present.  Cardiovascular: S1 normal and S2 normal.  Exam reveals no gallop.   No murmur heard. Pulses:      Dorsalis pedis pulses are 2+ on the right side, and 2+ on the left side.  Respiratory: No respiratory distress. She has no wheezes. She has no rhonchi. She has no rales.  GI: Soft. Bowel sounds are normal. There is no tenderness.  Musculoskeletal:       Right ankle: She exhibits no swelling.       Left ankle: She exhibits no swelling.  Lymphadenopathy:    She has no cervical adenopathy.  Neurological: She is alert. No cranial nerve deficit.  Patient able to lift up Cabrera left arm  Skin: Skin is warm. Nails show no clubbing.  Nonhealing ulcer right  elbow covered with wound VAC  Psychiatric: She has a normal mood and affect.      Data Reviewed: Basic Metabolic Panel:  Recent Labs Lab 05/09/17 1518 05/10/17 0426 05/11/17 0527 05/12/17 0527 05/14/17 0806  NA 133* 137 133* 137 136  K 3.3* 2.8* 5.0 4.1 3.3*  CL 96* 102 98* 107 104  CO2 23 26 25 24 24   GLUCOSE 140* 101* 178* 99 150*  BUN 18 16 19 16 10   CREATININE 0.88 0.69 0.69 0.79 0.56  CALCIUM 8.9 8.0* 9.1 8.7* 8.2*  MG  --  1.7 2.0  --  1.4*   Liver Function Tests:  Recent Labs Lab 05/09/17 1518  AST 31  ALT 11*  ALKPHOS 145*  BILITOT 0.4  PROT 8.3*  ALBUMIN 2.7*   CBC:  Recent Labs Lab 05/09/17 1518 05/10/17 0426 05/13/17 0544 05/14/17 0806  WBC 13.5* 9.4 11.3* 11.8*  NEUTROABS 9.5*  --   --   --   HGB 12.1 10.1* 10.8* 11.0*  HCT 36.8 30.1* 33.4* 33.1*  MCV 87.6 87.8 87.7 87.4  PLT 804* 604* 774* 715*    Scheduled Meds: . calcium-vitamin D  2 tablet Oral Q breakfast  . cholecalciferol  4,000 Units Oral Daily  . ciprofloxacin  500 mg Oral BID  . docusate sodium  100 mg Oral BID  . DULoxetine  30 mg Oral QHS  . DULoxetine  60 mg Oral BH-q7a  .  enoxaparin (LOVENOX) injection  40 mg Subcutaneous Q24H  . feeding supplement (ENSURE ENLIVE)  237 mL Oral TID BM  . ferrous sulfate  325 mg Oral BID WC  . fluticasone furoate-vilanterol  1 puff Inhalation q morning - 10a  . folic acid  1 mg Oral Daily  . Influenza vac split quadrivalent PF  0.5 mL Intramuscular Tomorrow-1000  . megestrol  40 mg Oral Daily  . metoprolol tartrate  25 mg Oral BID  . morphine  30 mg Oral Q12H  . multivitamin with minerals  1 tablet Oral Daily  . OLANZapine  5 mg Oral QHS  . pantoprazole  40 mg Oral BID AC  . QUEtiapine  25 mg Oral QHS  . senna-docusate  1 tablet Oral BID  . vitamin C  500 mg Oral BID    Assessment/Plan:  1. Acute delirium. Seems a little bit better today. Psychiatry added long-acting pain medication and Zyprexa. 2. Hypokalemia and hypomagnesemia.  IV magnesium ordered today. 3. Poor appetite, failure to thrive. CT scan negative for malignancy. Case discussed with gastroenterology Dr. Tobi Bastos. He will consider endoscopy if patient does not eat well. 4. Partially treated Serratia UTI. Rocephin changed to po cipro. 5. Neck pain and left shoulder pain. improved 6. Weakness. Physical therapy evaluation appreciated 7. Depression continue psychiatric medications. 8. Nonhealing ulcer on the right arm on a wound VAC. Follows with surgery as outpatient.  Code Status:     Code Status Orders        Start     Ordered   05/09/17 2216  Full code  Continuous     05/09/17 2215    Code Status History    Date Active Date Inactive Code Status Order ID Comments User Context   02/18/2017  5:30 AM 02/22/2017  3:44 PM Full Code [id number]  Ihor Austin, MD Inpatient   10/01/2016  1:20 PM 10/01/2016  6:20 PM Full Code [id number]  Kennedy Bucker, MD Inpatient   02/04/2016  4:31 PM 02/09/2016  5:14 PM Full Code [id number]  Marguarite Arbour, MD Inpatient   11/10/2015  4:58 AM 11/11/2015  6:48 PM Full Code [id number]  Enedina Finner, MD Inpatient   09/06/2015  4:04 AM 09/08/2015  8:04 PM Full Code [id number]  Ihor Austin, MD ED   07/26/2015  3:51 PM 08/01/2015  4:32 PM Full Code [id number]  Shaune Pollack, MD Inpatient   09/15/2014  6:36 PM 09/16/2014  5:22 PM Full Code [id number]  Dominica Severin, MD Inpatient   02/07/2014 11:04 AM 02/07/2014 10:39 PM Full Code [id number]  Hewitt Shorts, MD Inpatient   12/26/2012 11:54 PM 01/05/2013  3:34 PM Full Code [id number]  Ron Parker, MD ED     Family Communication: husband and son at the bedside Disposition Plan: to be determined  Antibiotics:  po Cipro  Time spent: 24 minutes  Alford Highland  [employer]

## 2017-05-14 NOTE — Progress Notes (Signed)
[facility] Medicare SNF authorization has been received, Berkley Harvey [id number]. [place] admissions coordinator at [facility] is aware of above.   [employer], LCSW [phone]

## 2017-05-14 NOTE — Clinical Social Work Placement (Signed)
   CLINICAL SOCIAL WORK PLACEMENT  NOTE  Date:  05/14/2017  Patient Details  Name: Michelle Cabrera MRN: [id number] Date of Birth: 06/16/1946  Clinical Social Work is seeking post-discharge placement for this patient at the Skilled  Nursing Facility level of care (*CSW will initial, date and re-position this form in  chart as items are completed):  Yes   Patient/family provided with [facility] Clinical Social Work Department's list of facilities offering this level of care within the geographic area requested by the patient (or if unable, by the patient's family).  Yes   Patient/family informed of their freedom to choose among providers that offer the needed level of care, that participate in Medicare, Medicaid or managed care program needed by the patient, have an available bed and are willing to accept the patient.  Yes   Patient/family informed of 's ownership interest in [facility] and [facility], as well as of the fact that they are under no obligation to receive care at these facilities.  PASRR submitted to EDS on 05/12/17     PASRR number received on 05/13/17     Existing PASRR number confirmed on       FL2 transmitted to all facilities in geographic area requested by pt/family on 05/07/17     FL2 transmitted to all facilities within larger geographic area on       Patient informed that his/her managed care company has contracts with or will negotiate with certain facilities, including the following:        Yes   Patient/family informed of bed offers received.  Patient chooses bed at  [facility] )     Physician recommends and patient chooses bed at      Patient to be transferred to   on  .  Patient to be transferred to facility by       Patient family notified on   of transfer.  Name of family member notified:        PHYSICIAN       Additional Comment:    _______________________________________________ Axxel Gude, Darleen Crocker,  LCSW 05/14/2017, 1:50 PM

## 2017-05-14 NOTE — Progress Notes (Signed)
PT changed recommendation from home health to SNF today. Clinical Child psychotherapist (CSW) contacted patient's husband Deniece Portela and made him aware of above. Deniece Portela prefers to take patient home however he is agreeable to SNF search. FL2 complete and faxed out. PASARR has been received, [id number] E expires 06/12/2017. CSW started [facility] SNF authorization through [facility].   CSW presented bed offers to husband and he chose [facility]. CSW sent [facility] admissions coordinator at [facility] a message making him aware of accepted bed offer and that patient has a wound vac on right arm.   [employer], LCSW [phone]

## 2017-05-14 NOTE — Consult Note (Signed)
WOC Nurse wound consult note Reason for Consult:Right elbow wound with VAC and nonremovable dressing per spouse at bedside.  He states they have a follow up appointment this Friday.  She is attached to her home VAC machine and a new home machine has been delivered to the room.  I have called Dr Kittie Plater office [phone]) and left amessage for clarification orders on the nurse line.  I left instructions for them to call me back directly or to call the unit and speak with the bedside nurse.  I have notified Thayer Ohm, RN of this.  Wound type: Trauma wound s/p debridement and placement of ACELL and VAC NPWT therapy.  Pressure Injury POA: NA Measurement:nonremovable dressing in place Dressing to remain intact per spouse's discharge instructions after placement of ACELL.  WIll await further instructions.  WOC team will follow.  Maple Hudson RN BSN CWON Pager [phone]

## 2017-05-14 NOTE — Progress Notes (Signed)
Physical Therapy Treatment Patient Details Name: Michelle Cabrera MRN: [id number] DOB: 03/13/1946 Today's Date: 05/14/2017    History of Present Illness New diagnosis on 10/17 of subacute delirium. Pt admitted on 10/12 for UTI, had been treating as outpatient for 2 weeks with meds. Had been complaining of confusion, nausea and UTI. PMH of anxiety, chronic LBP, COPD, depression, and irrigation/debridement of R elbow from prior olecranon fx.     PT Comments    Pt is making slow progress with goals. Pt performed UE/LE there-ex, bed mobility with mod assist., and transfers with RW with min assist. Pt appeared very lethargic and weak throughout session, however participated in all PT activities. Pt demonstrates weakness and decreased endurance with all mobility activities. Pt able to sit at EOB for several min, however appeared unsteady on feet during transfer to standing, did not appear safe to amb due to decreased strength and balance. [name] was present during session, provided encouragement. Changing recommendation from HHPT to SNF due to pt's current mobility status. Communicated with pt and pt's husband change to SNF, both understood.    Follow Up Recommendations  SNF     Equipment Recommendations  None recommended by PT    Recommendations for Other Services       Precautions / Restrictions Precautions Precautions: Fall;Other (comment) (R elbow) Restrictions Weight Bearing Restrictions: No    Mobility  Bed Mobility Overal bed mobility: Needs Assistance Bed Mobility: Supine to Sit     Supine to sit: Mod assist     General bed mobility comments: Pt performed log roll to seated EOB, assistance to guide LEs off bed and lift trunk up, pt able to push up with LUE. Pt cued to scoot to EOB. Pt appeared very fatigued and in discomfort.   Transfers Overall transfer level: Needs assistance Equipment used: Right platform walker Transfers: Sit to/from Stand Sit to Stand: Min  assist         General transfer comment: Cues for proper hand placement and to lean trunk forward, requires additional time to rise to standing.   Ambulation/Gait             General Gait Details: Deferred, pt unsafe to amb due to fall risk   Stairs            Wheelchair Mobility    Modified Rankin (Stroke Patients Only)       Balance Overall balance assessment: Needs assistance Sitting-balance support: Feet supported Sitting balance-Leahy Scale: Fair Sitting balance - Comments: Pt requires assistance to get into seated position at EOB, able to maintain position with CGA for safety due to weakness. Pt able to maintain position to perform LAQ exercise, fatigues quickly.    Standing balance support: Bilateral upper extremity supported Standing balance-Leahy Scale: Fair Standing balance comment: Pt unable to maintain standing position with platform walker for more than several seconds due to fatigue and weakness. No LOB but appeared unsteady on feet                            Cognition Arousal/Alertness: Lethargic Behavior During Therapy: WFL for tasks assessed/performed Overall Cognitive Status: Impaired/Different from baseline                                        Exercises Other Exercises Other Exercises: Therex 12x B, ankle pumps, SLRs, hip abd/add,  heel slides, LAQs, bicep curls, arm raises, air punches. Cues for proper technique, CGA to help initiate movement    General Comments        Pertinent Vitals/Pain Pain Assessment: Faces Pain Score: 6  Faces Pain Scale: Hurts even more Pain Intervention(s): Limited activity within patient's tolerance;Monitored during session;Repositioned    Home Living                      Prior Function            PT Goals (current goals can now be found in the care plan section) Acute Rehab PT Goals Patient Stated Goal: to get stronger PT Goal Formulation: With patient Time For  Goal Achievement: 05/26/17 Potential to Achieve Goals: Good Progress towards PT goals: Progressing toward goals    Frequency    Min 2X/week      PT Plan Discharge plan needs to be updated    Co-evaluation              AM-PAC PT "6 Clicks" Daily Activity  Outcome Measure  Difficulty turning over in bed (including adjusting bedclothes, sheets and blankets)?: Unable Difficulty moving from lying on back to sitting on the side of the bed? : Unable Difficulty sitting down on and standing up from a chair with arms (e.g., wheelchair, bedside commode, etc,.)?: Unable Help needed moving to and from a bed to chair (including a wheelchair)?: A Little Help needed walking in hospital room?: A Lot Help needed climbing 3-5 steps with a railing? : A Lot 6 Click Score: 10    End of Session Equipment Utilized During Treatment: Gait belt Activity Tolerance: Patient limited by fatigue Patient left: in bed;with bed alarm set;with family/visitor present;with call bell/phone within reach Nurse Communication: Mobility status PT Visit Diagnosis: Unsteadiness on feet (R26.81);Other abnormalities of gait and mobility (R26.89);Muscle weakness (generalized) (M62.81);Pain     Time: 0930-1001 PT Time Calculation (min) (ACUTE ONLY): 31 min  Charges:                       G Codes:  Functional Assessment Tool Used: AM-PAC 6 Clicks Basic Mobility Functional Limitation: Mobility: Walking and moving around Mobility: Walking and Moving Around Current Status (A4166): At least 60 percent but less than 80 percent impaired, limited or restricted Mobility: Walking and Moving Around Goal Status [id number]): At least 40 percent but less than 60 percent impaired, limited or restricted    Renford Dills, SPT   Erle Crocker Abel Hageman 05/14/2017, 11:02 AM

## 2017-05-14 NOTE — Progress Notes (Signed)
Nutrition Follow-up  DOCUMENTATION CODES:   Severe malnutrition in context of chronic illness  INTERVENTION:  Continue Ensure Enlive po TID, each supplement provides 350 kcal and 20 grams of protein.  Also provide Magic cup TID with meals, each supplement provides 290 kcal and 9 grams of protein.  Continue daily MVI and vitamin C 500 mg BID.  NUTRITION DIAGNOSIS:   Malnutrition (Severe) related to chronic illness (non-healing wound on right elbow with wound VAC, fall July 2018) as evidenced by severe depletion of body fat, severe depletion of muscle mass, percent weight loss.  Ongoing.  GOAL:   Patient will meet greater than or equal to 90% of their needs  Not met - patient only taking bites.  MONITOR:   PO intake, Supplement acceptance, Labs, Weight trends, Skin, I & O's  REASON FOR ASSESSMENT:   Malnutrition Screening Tool    ASSESSMENT:   71 year old female with PMHx of HTN, COPD, anxiety, depression, GERD, RA, DDD, DM type 2, OP, who is admitted with acute encephalopathy secondary to UTI and medications, hypokalemia, hypomagnesemia, neck pain and left shoulder pain, nonhealing ulcer to right arm on wound VAC.  -Per chart patient had been having some hallucinations earlier this week, but has improved now. Patient s/p evaluation from Psychiatry. -CT head on 10/16 showed no acute intracranial abnormality. -CT abdomen today had no acute or inflammatory process identified in abdomen or pelvis and no bowel obstruction.  Spoke with patient and family members at bedside. Patient reports her appetite has been worsening and she does not have a taste for food anymore. She is also getting worn out with chewing her food. Family reports she is now only taking bites of meals. She is drinking her Ensure, but is only able to drink approximately 50% of each bottle. Offered to downgrade so patient will not have to expend so much energy on chewing, but patient does not want her diet  downgraded.   Meal Completion: 0-25%  Medications reviewed and include: Oscal with D 2 tablets daily, ciprofloxacin, Colace, ferrous sulfate 283 mg BID, folic acid 1 mg daily, Megace 40 mg daily, MVI daily, pantoprazole, senna, vitamin C 500 mg BID, magnesium sulfate 2 grams IV once today.  Labs reviewed: Potassium 3.3.  No subsequent weight from admission to trend.  Diet Order:  Diet regular Room service appropriate? Yes; Fluid consistency: Thin  Skin:  Wound (see comment) (non-pressure wound right elbow with wound VAC)  Last BM:  05/14/2017 - medium type 7  Height:   Ht Readings from Last 1 Encounters:  05/09/17 5' 2"  (1.575 m)    Weight:   Wt Readings from Last 1 Encounters:  05/09/17 125 lb (56.7 kg)    Ideal Body Weight:  50 kg  BMI:  Body mass index is 22.86 kg/m.  Estimated Nutritional Needs:   Kcal:  1700-1930 (30-34 kcal/kg)  Protein:  85-96 grams (1.5-1.7 grams/kg)  Fluid:  1.4-1.7 L/day (25-30 ml/kg)  EDUCATION NEEDS:   Education needs addressed  Willey Blade, [place], RD, [place] Office: [phone] Pager: [phone] After Hours/Weekend Pager: [phone]

## 2017-05-14 NOTE — Consult Note (Addendum)
[facility] Consult   Reason for Consult:  Consult for patient with multiple medical problems. Concern about delirium Referring Physician:  Earleen Newport Patient Identification: Michelle Cabrera MRN:  [id number] Principal Diagnosis: Subacute delirium Diagnosis:   Patient Active Problem List   Diagnosis Date Noted  . Subacute delirium [F05] 05/13/2017  . Chronic prescription opiate use [Z79.891] 05/13/2017  . Constipation [K59.00] 05/13/2017  . UTI (urinary tract infection) [N39.0] 05/09/2017  . Tachycardia [R00.0] 03/13/2017  . Protein calorie malnutrition ([place]) [E46] 03/13/2017  . Closed fracture of right olecranon process with routine healing [S52.021D] 03/13/2017  . Hip fracture ([place]) [S72.009A] 02/18/2017  . Bilateral carotid artery stenosis [I65.23] 12/10/2016  . 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 Thoracic compression fracture ([place]) [S22.000A] 02/04/2016  . UTI (lower urinary tract infection) [N39.0] 02/04/2016  . Schizophrenia spectrum disorder with psychotic disorder type not yet determined ([place]) [F29] 11/13/2015  . Syncope [R55] 11/10/2015  . Hypotension [I95.9] 11/10/2015  . 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 Pressure ulcer [L89.90] 09/08/2015  . New onset type 2 diabetes mellitus ([place]) [E11.9] 09/06/2015  . Altered mental status [R41.82] 09/06/2015  . COPD exacerbation ([place]) [J44.1] 07/26/2015  . Arthritis associated with another disorder [M19.90] 09/15/2014  . Wedge compression fracture of T6 vertebra ([place]) [S22.050A] 02/07/2014  . Intractable low back pain [M54.5] 12/27/2012  . DDD (degenerative disc disease) [IMO0002] 12/27/2012  . Rheumatoid arthritis ([place]) [M06.9] 12/27/2012  . Hypertension [I10] 12/27/2012    Total Time spent with patient: 20 minutes  Subjective:   Michelle Cabrera is a 71 y.o. female patient admitted with "they needed to do something".  Follow-up note for Wednesday the 17th. Patient seen. Family  present patient seems to be a little bit more alert and coherent today. She was able to answer some but not all questions correctly. She is still getting lightheaded when she stands up but today he is starting to eat a little better with assistance. Denies having beenagitated. Denies any acute hallucinations.  HPI:  Patient interviewed. Chart reviewed.t has been treated for multiple medical problems concern about delirium. Patient was admitted to the hospital with a history of recent mental status change. She was confused and disoriented. Found to have a urinary tract infection. A couple days later she remains confused. On interview today the patient was confused about where she was. Did not know the year correctly. Did not understand the situation. Patient was not initially agitated although she was picking at some of her dressings. She is complaining of pain but does not appear to be in distinct agony. Husband came in during the interview and states that her current mental status is typical of what she has had when she had urinary tract infections in the past. Husband reports that the patient normally takes oxymorphone 5 mg 4 times a day at home. I confirmed that this is her standing prescription. He also reports that normally when she takes this she is not oversedated or delirious at home. She had also been taking Seroquel 125 or 150 mg at home. She went to a doctor recently who told her she needed to cut back on the dosage of that although the husband reports that she had not been confused with that dose. He also reports that she has had chronic constipation. Patient is not complaining of mood symptoms. Not complaining of suicidality. She does not spontaneously discusses having hallucinations although the husband says she has been seeing things today.  Social history: Patient  lives at home with her husband. Not employed outside the home. Chronically disabled.  Medical history: Rheumatoid arthritis history  of recurrent urinary tract infections. Patient has had documented spells of delirium and confusion when she was acutely sick in the past attended clear up pretty quickly.  Substance abuse history: I was not able to identify any clear evidence of substance abuse issues in the past.  Past Psychiatric History: Patient has been seen previously by the psychiatric service under similar circumstances. There was one episode in which she was sent to [facility] because of persistent confusion. Husband reports that she stayed there only a short period of time and was thought to not have an ongoing psychotic disorder. They did not change her medicine but left her on the modest dose of Seroquel. No history of suicide attempts or violence.  Risk to Self: Is patient at risk for suicide?: No Risk to Others:   Prior Inpatient Therapy:   Prior Outpatient Therapy:    Past Medical History:  Past Medical History:  Diagnosis Date  . Anxiety   . Arthritis   . Cataract   . Cervicalgia   . Chronic lower back pain   . Closed compression fracture of L1 lumbar vertebra ([place]) 11/2012  . Collagen vascular disease ([place])   . COPD (chronic obstructive pulmonary disease) ([place])    unspecified  . DDD (degenerative disc disease)   . Depression   . Diabetes mellitus ([place])   . Disorder of bursae and tendons in shoulder region    unspecified  . Fall 09/27/2016   Fall Feb 04, 2016 with broken vertebra  . GERD (gastroesophageal reflux disease)   . Headache   . Hereditary and idiopathic peripheral neuropathy    unspecified  . History of adenomatous polyp of colon    Followed by Dr. Tiffany Kocher  . History of tubal ligation   . Hypertension   . Intermediate coronary syndrome ([place])   . Lumbago   . Neuralgia and neuritis, unspecified    radiculitis unspecified  . Rheumatoid arthritis ([place])   . Rheumatoid arthritis ([place])   . Senile osteoporosis   . Spinal stenosis, lumbar region without neurogenic claudication     Past  Surgical History:  Procedure Laterality Date  . BACK SURGERY    . BREAST BIOPSY Left    negative  . CATARACT EXTRACTION W/ INTRAOCULAR LENS  IMPLANT, BILATERAL Bilateral   . COLONOSCOPY  08/17/2007   Adenomatous Polyps  . COLONOSCOPY  10/09/2010   PH Adenomatous polyps : [facility] 09/2015 ; Recall Ltr mailed 08/17/2015 (dw)  . ESOPHAGOGASTRODUODENOSCOPY  01/14/2008   No repeat per RTE  . FINGER ARTHROPLASTY Right 09/15/2014   Procedure: RIGHT MIDDLE FINGER, RING FINGER, SMALL FINGER EXTENSOR DIGITORUM COMMUMIS STABILIZATION WITH RIGHT MIDDLE FINGER MCP ARTHROPLASTY, POSSIBLE RING FINGER AND SMALL FINGER MCP ARTHROPLASTY;  Surgeon: Roseanne Kaufman, MD;  Location: [facility];  Service: Orthopedics;  Laterality: Right;  . FOOT SURGERY Bilateral   . HAND TENDON SURGERY Right 09/15/2014   "d/t RA"  . HARDWARE REMOVAL Right 04/16/2017   Procedure: HARDWARE REMOVAL;  Surgeon: Dereck Leep, MD;  Location: [facility];  Service: Orthopedics;  Laterality: Right;  . HUMERUS FRACTURE SURGERY Left   . INCISION AND DRAINAGE OF WOUND Right 04/16/2017   Procedure: IRRIGATION AND DEBRIDEMENT WOUND;  Surgeon: Wallace Going, DO;  Location: [facility];  Service: Plastics;  Laterality: Right;  . INTRAMEDULLARY (IM) NAIL INTERTROCHANTERIC Right 02/19/2017   Procedure: INTRAMEDULLARY (IM) NAIL INTERTROCHANTRIC;  Surgeon: Marry Guan,  Laurice Record, MD;  Location: [facility];  Service: Orthopedics;  Laterality: Right;  . KYPHOPLASTY N/A 01/04/2013   Procedure: L1 Kyphoplasty;  Surgeon: Hosie Spangle, MD;  Location: [facility];  Service: Neurosurgery;  Laterality: N/A;  L1 Kyphoplasty  . KYPHOPLASTY N/A 02/07/2014   Procedure: THORACIC SIX KYPHOPLASTY;  Surgeon: Hosie Spangle, MD;  Location: [facility];  Service: Neurosurgery;  Laterality: N/A;  T6 Kyphoplasty   . KYPHOPLASTY N/A 02/08/2016   Procedure: KYPHOPLASTY  T-12;  Surgeon: Hessie Knows, MD;  Location: [facility];  Service: Orthopedics;  Laterality: N/A;  . KYPHOPLASTY N/A  10/01/2016   Procedure: KYPHOPLASTY  L-5;  Surgeon: Hessie Knows, MD;  Location: [facility];  Service: Orthopedics;  Laterality: N/A;  . LUMBAR [place] SURGERY  2014    X 3  . LUMBAR LAMINECTOMY    . ORIF ELBOW FRACTURE Right 02/19/2017   Procedure: OPEN REDUCTION INTERNAL FIXATION (ORIF) ELBOW/OLECRANON FRACTURE;  Surgeon: Dereck Leep, MD;  Location: [facility];  Service: Orthopedics;  Laterality: Right;  . PERIPHERAL VASCULAR CATHETERIZATION Left 12/07/2015   Procedure: Upper Extremity Angiography;  Surgeon: Algernon Huxley, MD;  Location: [facility];  Service: Cardiovascular;  Laterality: Left;  . PERIPHERAL VASCULAR CATHETERIZATION  12/07/2015   Procedure: Upper Extremity Intervention;  Surgeon: Algernon Huxley, MD;  Location: [place] [facility] [facility];  Service: Cardiovascular;;  . POSTERIOR LUMBAR FUSION     "got screws in"  . TONSILLECTOMY    . TUBAL LIGATION    . WRIST FUSION Right    30 yrs ago   Family History:  Family History  Problem Relation Age of Onset  . CAD Father   . Alcohol abuse Father   . Heart disease Father   . CAD Mother   . Hypertension Mother   . Peripheral vascular disease Mother   . Osteoarthritis Mother   . Heart disease Mother   . Breast cancer Maternal Aunt 17  . Alcohol abuse Sister   . Arthritis Sister   . Cancer Sister    Family Psychiatric  History: Does not know of any Social History:  History  Alcohol Use No     History  Drug Use No    Social History   Social History  . Marital status: Married    Spouse name: N/A  . Number of children: 2  . Years of education: 12   Occupational History  . retired    Social History Main Topics  . Smoking status: Former Smoker    Packs/day: 1.00    Years: 56.00    Types: Cigarettes    Quit date: 07/25/2015  . Smokeless tobacco: Never Used  . Alcohol use No  . Drug use: No  . Sexual activity: Yes   Other Topics Concern  . None   Social History Narrative   Lives with husband at home    Additional Social History:    Allergies:   Allergies  Allergen Reactions  . Ambien [Zolpidem Tartrate] Other (See Comments)    Over sedation   . Dilaudid [Hydromorphone Hcl] Other (See Comments)    Made tongue swell  . Gold-Containing Drug Products Anaphylaxis  . Adhesive [Tape] Other (See Comments)    Tears skin  . Haldol [Haloperidol] Other (See Comments)    Hallucinations    Labs:  Results for orders placed or performed during the hospital encounter of 05/09/17 (from the past 48 hour(s))  CBC     Status: Abnormal  Collection Time: 05/13/17  5:44 AM  Result Value Ref Range   WBC 11.3 (H) 3.6 - 11.0 K/uL   RBC 3.81 3.80 - 5.20 MIL/uL   Hemoglobin 10.8 (L) 12.0 - 16.0 g/dL   HCT 33.4 (L) 35.0 - 47.0 %   MCV 87.7 80.0 - 100.0 fL   MCH 28.5 26.0 - 34.0 pg   MCHC 32.5 32.0 - 36.0 g/dL   RDW 17.9 (H) 11.5 - 14.5 %   Platelets 774 (H) 150 - 440 K/uL  Basic metabolic panel     Status: Abnormal   Collection Time: 05/14/17  8:06 AM  Result Value Ref Range   Sodium 136 135 - 145 mmol/L   Potassium 3.3 (L) 3.5 - 5.1 mmol/L   Chloride 104 101 - 111 mmol/L   CO2 24 22 - 32 mmol/L   Glucose, Bld 150 (H) 65 - 99 mg/dL   BUN 10 6 - 20 mg/dL   Creatinine, Ser 0.56 0.44 - 1.00 mg/dL   Calcium 8.2 (L) 8.9 - 10.3 mg/dL   GFR calc non Af Amer >60 >60 mL/min   GFR calc Af Amer >60 >60 mL/min    Comment: (NOTE) The eGFR has been calculated using the CKD EPI equation. This calculation has not been validated in all clinical situations. eGFR's persistently <60 mL/min signify possible Chronic Kidney Disease.    Anion gap 8 5 - 15  CBC     Status: Abnormal   Collection Time: 05/14/17  8:06 AM  Result Value Ref Range   WBC 11.8 (H) 3.6 - 11.0 K/uL   RBC 3.79 (L) 3.80 - 5.20 MIL/uL   Hemoglobin 11.0 (L) 12.0 - 16.0 g/dL   HCT 33.1 (L) 35.0 - 47.0 %   MCV 87.4 80.0 - 100.0 fL   MCH 29.1 26.0 - 34.0 pg   MCHC 33.3 32.0 - 36.0 g/dL   RDW 17.9 (H) 11.5 - 14.5 %   Platelets 715 (H)  150 - 440 K/uL  Magnesium     Status: Abnormal   Collection Time: 05/14/17  8:06 AM  Result Value Ref Range   Magnesium 1.4 (L) 1.7 - 2.4 mg/dL    Current Facility-Administered Medications  Medication Dose Route Frequency Provider Last Rate Last Dose  . acetaminophen (TYLENOL) tablet 650 mg  650 mg Oral Q6H PRN Dustin Flock, MD       Or  . acetaminophen (TYLENOL) suppository 650 mg  650 mg Rectal Q6H PRN Dustin Flock, MD      . acetaminophen (TYLENOL) tablet 1,000 mg  1,000 mg Oral Daily PRN Dustin Flock, MD      . albuterol (PROVENTIL) (2.5 MG/3ML) 0.083% nebulizer solution 2.5 mg  2.5 mg Inhalation Q6H PRN Dustin Flock, MD      . calcium-vitamin D (OSCAL WITH D) 500-200 MG-UNIT per tablet 2 tablet  2 tablet Oral Q breakfast Dustin Flock, MD   2 tablet at 05/14/17 1313  . cholecalciferol (VITAMIN D) tablet 4,000 Units  4,000 Units Oral Daily Dustin Flock, MD   4,000 Units at 05/14/17 1310  . ciprofloxacin (CIPRO) tablet 500 mg  500 mg Oral BID Loletha Grayer, MD   500 mg at 05/14/17 1313  . docusate sodium (COLACE) capsule 100 mg  100 mg Oral BID Clapacs, John T, MD   100 mg at 05/14/17 1320  . DULoxetine (CYMBALTA) DR capsule 30 mg  30 mg Oral QHS Dustin Flock, MD   30 mg at 05/13/17 2202  . DULoxetine (CYMBALTA)  DR capsule 60 mg  60 mg Oral Chong Sicilian, Chana Bode, MD   60 mg at 05/14/17 1320  . enoxaparin (LOVENOX) injection 40 mg  40 mg Subcutaneous Q24H Dustin Flock, MD   40 mg at 05/13/17 2203  . feeding supplement (ENSURE ENLIVE) (ENSURE ENLIVE) liquid 237 mL  237 mL Oral TID BM Loletha Grayer, MD   237 mL at 05/14/17 1320  . ferrous sulfate tablet 325 mg  325 mg Oral BID WC Dustin Flock, MD   325 mg at 05/14/17 1319  . fluticasone furoate-vilanterol (BREO ELLIPTA) 100-25 MCG/INH 1 puff  1 puff Inhalation q morning - 10a Dustin Flock, MD   1 puff at 05/14/17 1322  . folic acid (FOLVITE) tablet 1 mg  1 mg Oral Daily Dustin Flock, MD   1 mg at  05/14/17 1319  . HYDROcodone-acetaminophen (NORCO/VICODIN) 5-325 MG per tablet 1 tablet  1 tablet Oral Q6H PRN Loletha Grayer, MD   1 tablet at 05/12/17 1303  . Influenza vac split quadrivalent PF (FLUZONE HIGH-DOSE) injection 0.5 mL  0.5 mL Intramuscular Tomorrow-1000 Fritzi Mandes, MD      . magnesium sulfate IVPB 2 g 50 mL  2 g Intravenous Once Wieting, Richard, MD      . megestrol (MEGACE) tablet 40 mg  40 mg Oral Daily Loletha Grayer, MD   40 mg at 05/14/17 1309  . metoprolol tartrate (LOPRESSOR) tablet 25 mg  25 mg Oral BID Dustin Flock, MD   25 mg at 05/13/17 2202  . morphine (MS CONTIN) 12 hr tablet 30 mg  30 mg Oral Q12H Clapacs, John T, MD   30 mg at 05/14/17 1308  . multivitamin with minerals tablet 1 tablet  1 tablet Oral Daily Loletha Grayer, MD   1 tablet at 05/14/17 1321  . OLANZapine (ZYPREXA) tablet 5 mg  5 mg Oral QHS Clapacs, Madie Reno, MD   5 mg at 05/13/17 2203  . ondansetron (ZOFRAN) tablet 4 mg  4 mg Oral Q6H PRN Dustin Flock, MD       Or  . ondansetron (ZOFRAN) injection 4 mg  4 mg Intravenous Q6H PRN Dustin Flock, MD      . pantoprazole (PROTONIX) EC tablet 40 mg  40 mg Oral BID AC Loletha Grayer, MD   40 mg at 05/14/17 1318  . polyethylene glycol (MIRALAX / GLYCOLAX) packet 17 g  17 g Oral Daily PRN Dustin Flock, MD      . QUEtiapine (SEROQUEL) tablet 25 mg  25 mg Oral QHS Loletha Grayer, MD   25 mg at 05/13/17 2202  . senna-docusate (Senokot-S) tablet 1 tablet  1 tablet Oral BID Loletha Grayer, MD   1 tablet at 05/12/17 2029  . vitamin C (ASCORBIC ACID) tablet 500 mg  500 mg Oral BID Loletha Grayer, MD   500 mg at 05/14/17 1313    Musculoskeletal: Strength & Muscle Tone: decreased Gait & Station: unsteady Patient leans: N/A  Psychiatric Specialty Exam: Physical Exam  Nursing note and vitals reviewed. Constitutional: She appears well-developed and well-nourished.  HENT:  Head: Normocephalic and atraumatic.  Eyes: Pupils are equal, round,  and reactive to light. Conjunctivae are normal.  Neck: Normal range of motion.  Cardiovascular: Normal heart sounds.   Respiratory: Effort normal. No respiratory distress.  GI: Soft.  Musculoskeletal: Normal range of motion.       Arms: Neurological: She is alert.  Skin: Skin is warm and dry.     Psychiatric: Her mood appears not anxious.  Her affect is blunt. Her speech is delayed. Her speech is not tangential. She is slowed. Thought content is not paranoid. Cognition and memory are impaired. She expresses no homicidal and no suicidal ideation. She is communicative. She exhibits abnormal recent memory.    Review of Systems  Constitutional: Negative.   HENT: Negative.   Eyes: Negative.   Respiratory: Negative.   Cardiovascular: Negative.   Gastrointestinal: Positive for constipation.  Musculoskeletal: Positive for back pain and joint pain.  Skin: Negative.   Neurological: Negative.   Psychiatric/Behavioral: Positive for memory loss. Negative for depression, hallucinations, substance abuse and suicidal ideas. The patient has insomnia. The patient is not nervous/anxious.     Blood pressure (!) 96/53, pulse 99, temperature 99.1 F (37.3 C), temperature source Oral, resp. rate 17, height _0  (1.575 m), weight 56.7 kg (125 lb), SpO2 96 %.Body mass index is 22.86 kg/m.  General Appearance: Disheveled  Eye Contact:  Minimal  Speech:  Slow  Volume:  Decreased  Mood:  Euthymic  Affect:  Constricted  Thought Process:  Disorganized  Orientation:  Negative  Thought Content:  Illogical, Rumination and Tangential  Suicidal Thoughts:  No  Homicidal Thoughts:  No  Memory:  Immediate;   Fair Recent;   Poor Remote;   Poor  Judgement:  Impaired  Insight:  Lacking  Psychomotor Activity:  Decreased  Concentration:  Concentration: Poor  Recall:  Poor  Fund of Knowledge:  Poor  Language:  Poor  Akathisia:  No  Handed:  Right  AIMS (if indicated):     Assets:  Housing Social Support   ADL's:  Impaired  Cognition:  Impaired,  Moderate  Sleep:        Treatment Plan Summary: Daily contact with patient to assess and evaluate symptoms and progress in treatment, Medication management and Plan Patient currently presents as quietly delirious although she is at high risk for sundowning. The low dose of Seroquel is much less than what she was taking previously. It appears that both her Seroquel and her oxymorphone have been dramatically decreased over the last few days. Husband swears that on the full doses of these that she was fully functional and ambulatory at home. He is very concerned about her current condition. He insists that he does not believe that she is able to ambulate safely independently right now. She also is not eating very well. Husband believes she is at high risk to come right back into the hospital if discharged home and would prefer her to go to rehabilitation or some other kind of inpatient or to stay in the hospital a short while longer. I told him I had no direct control over that. As far however is treating the delirium I think that the current 25 mg of Seroquel seems to be inadequate. If there was really concerned about oversedation from the larger dose of Seroquel I will change her to Zyprexa 5 mg at night which should be less sedating but hopefully equally effective. I am also going to try putting her back on her oxymorphone although here at the hospital we only have the equivalent MS Contin. Husband insists that it was not over sedating her at home. I also have added Colace for her chronic constipation. I will follow-up as she is in the hospital.  Disposition: No evidence of imminent risk to self or others at present.   Patient does not meet criteria for psychiatric inpatient admission. Supportive therapy provided about ongoing stressors.  Alethia Berthold, MD 05/14/2017  2:57 PM

## 2017-05-15 LAB — BASIC METABOLIC PANEL
Anion gap: 7 (ref 5–15)
BUN: 14 mg/dL (ref 6–20)
CALCIUM: 8.1 mg/dL — AB (ref 8.9–10.3)
CO2: 24 mmol/L (ref 22–32)
CREATININE: 0.68 mg/dL (ref 0.44–1.00)
Chloride: 104 mmol/L (ref 101–111)
GFR calc Af Amer: >60 mL/min (ref >60)
GLUCOSE: 154 mg/dL — AB (ref 65–99)
Potassium: 4 mmol/L (ref 3.5–5.1)
Sodium: 135 mmol/L (ref 135–145)

## 2017-05-15 LAB — MAGNESIUM: Magnesium: 2.3 mg/dL (ref 1.7–2.4)

## 2017-05-15 MED ORDER — MORPHINE SULFATE ER 15 MG PO TBCR
15.0000 mg | EXTENDED_RELEASE_TABLET | Freq: Two times a day (BID) | ORAL | Status: DC
  Administered 2017-05-15 – 2017-05-16 (×2): 15 mg via ORAL
  Filled 2017-05-15 (×2): qty 1

## 2017-05-15 MED ORDER — PREDNISONE 20 MG PO TABS
20.0000 mg | ORAL_TABLET | Freq: Every day | ORAL | Status: DC
  Administered 2017-05-15 – 2017-05-16 (×2): 20 mg via ORAL
  Filled 2017-05-15 (×2): qty 1

## 2017-05-15 NOTE — Progress Notes (Signed)
Medications administered by student RN [id number] with supervision of Clinical Instructor Bowden Boody MSN, RN-BC or patient's assigned RN.   

## 2017-05-15 NOTE — Progress Notes (Signed)
Patient ID: Michelle Cabrera, female   DOB: November 21, 1945, 71 y.o.   MRN: [id number]   [facility] PROGRESS NOTE  CHERYLYN SUNDBY VVO:[id number] DOB: [birth year] DOA: 05/09/2017 PCP: Lauro Regulus, MD  HPI/Subjective: Patient was a little lethargic this morning her blood pressure was a little low. She states that her joints are hurting her a little bit in asking for some prednisone. She's been eating a little bit better. No further hallucinations.  Objective: Vitals:   05/15/17 1119 05/15/17 1335  BP: 120/65 122/68  Pulse: 92 99  Resp:  18  Temp:  98.1 F (36.7 C)  SpO2:  92%    Filed Weights   05/09/17 1516  Weight: 56.7 kg (125 lb)    ROS: Review of Systems  Constitutional: Negative for fever.  Respiratory: Negative for cough and shortness of breath.   Cardiovascular: Negative for chest pain.  Gastrointestinal: Negative for abdominal pain, constipation, diarrhea, nausea and vomiting.  Musculoskeletal: Positive for joint pain.   Exam: Physical Exam  HENT:  Nose: No mucosal edema.  Mouth/Throat: No oropharyngeal exudate or posterior oropharyngeal edema.  Eyes: Pupils are equal, round, and reactive to light. Conjunctivae, EOM and lids are normal.  Neck: No JVD present. Carotid bruit is not present. No edema present. No thyroid mass and no thyromegaly present.  Cardiovascular: S1 normal and S2 normal.  Exam reveals no gallop.   No murmur heard. Pulses:      Dorsalis pedis pulses are 2+ on the right side, and 2+ on the left side.  Respiratory: No respiratory distress. She has no wheezes. She has no rhonchi. She has no rales.  GI: Soft. Bowel sounds are normal. There is no tenderness.  Musculoskeletal:       Right ankle: She exhibits no swelling.       Left ankle: She exhibits no swelling.  Lymphadenopathy:    She has no cervical adenopathy.  Neurological: She is alert. No cranial nerve deficit.  Patient able to lift up her left arm. First MTP joints on both  hands swollen and painful  Skin: Skin is warm. Nails show no clubbing.  Nonhealing ulcer right elbow covered with wound VAC  Psychiatric: She has a normal mood and affect.      Data Reviewed: Basic Metabolic Panel:  Recent Labs Lab 05/10/17 0426 05/11/17 0527 05/12/17 0527 05/14/17 0806 05/15/17 0734  NA 137 133* 137 136 135  K 2.8* 5.0 4.1 3.3* 4.0  CL 102 98* 107 104 104  CO2 26 25 24 24 24   GLUCOSE 101* 178* 99 150* 154*  BUN 16 19 16 10 14   CREATININE 0.69 0.69 0.79 0.56 0.68  CALCIUM 8.0* 9.1 8.7* 8.2* 8.1*  MG 1.7 2.0  --  1.4* 2.3   Liver Function Tests:  Recent Labs Lab 05/09/17 1518  AST 31  ALT 11*  ALKPHOS 145*  BILITOT 0.4  PROT 8.3*  ALBUMIN 2.7*   CBC:  Recent Labs Lab 05/09/17 1518 05/10/17 0426 05/13/17 0544 05/14/17 0806  WBC 13.5* 9.4 11.3* 11.8*  NEUTROABS 9.5*  --   --   --   HGB 12.1 10.1* 10.8* 11.0*  HCT 36.8 30.1* 33.4* 33.1*  MCV 87.6 87.8 87.7 87.4  PLT 804* 604* 774* 715*    Scheduled Meds: . calcium-vitamin D  2 tablet Oral Q breakfast  . cholecalciferol  4,000 Units Oral Daily  . ciprofloxacin  500 mg Oral BID  . docusate sodium  100 mg Oral BID  .  DULoxetine  30 mg Oral QHS  . DULoxetine  60 mg Oral BH-q7a  . enoxaparin (LOVENOX) injection  40 mg Subcutaneous Q24H  . feeding supplement (ENSURE ENLIVE)  237 mL Oral TID BM  . ferrous sulfate  325 mg Oral BID WC  . fluticasone furoate-vilanterol  1 puff Inhalation q morning - 10a  . folic acid  1 mg Oral Daily  . Influenza vac split quadrivalent PF  0.5 mL Intramuscular Tomorrow-1000  . megestrol  40 mg Oral Daily  . morphine  15 mg Oral Q12H  . multivitamin with minerals  1 tablet Oral Daily  . OLANZapine  5 mg Oral QHS  . pantoprazole  40 mg Oral BID AC  . predniSONE  20 mg Oral Q breakfast  . QUEtiapine  25 mg Oral QHS  . senna-docusate  1 tablet Oral BID  . vitamin C  500 mg Oral BID    Assessment/Plan:  1. Acute delirium. Seems Better today even though  she was a little lethargic this morning as per the nurse. Continue Seroquel and Zyprexa.  But the dose of MS Contin down to 15 mg twice a day 2. Rheumatoid arthritis flare fingers. Start low-dose prednisone. 3. Hypokalemia and hypomagnesemia. Replaced 4. Poor appetite, failure to thrive. CT scan negative for malignancy. Patient starting to eat a little bit better as of yesterday. 5. Partially treated Serratia UTI. Rocephin changed to po cipro. 6. Weakness. Physical therapy evaluation recommended rehabilitation but family wants to take patient home. 7. Depression continue psychiatric medications. 8. Nonhealing ulcer on the right arm on a wound VAC. Follows with surgery as outpatient.  Code Status:     Code Status Orders        Start     Ordered   05/09/17 2216  Full code  Continuous     05/09/17 2215    Code Status History    Date Active Date Inactive Code Status Order ID Comments User Context   02/18/2017  5:30 AM 02/22/2017  3:44 PM Full Code [id number]  Ihor Austin, MD Inpatient   10/01/2016  1:20 PM 10/01/2016  6:20 PM Full Code [id number]  Kennedy Bucker, MD Inpatient   02/04/2016  4:31 PM 02/09/2016  5:14 PM Full Code [id number]  Marguarite Arbour, MD Inpatient   11/10/2015  4:58 AM 11/11/2015  6:48 PM Full Code [id number]  Enedina Finner, MD Inpatient   09/06/2015  4:04 AM 09/08/2015  8:04 PM Full Code [id number]  Ihor Austin, MD ED   07/26/2015  3:51 PM 08/01/2015  4:32 PM Full Code [id number]  Shaune Pollack, MD Inpatient   09/15/2014  6:36 PM 09/16/2014  5:22 PM Full Code [id number]  Dominica Severin, MD Inpatient   02/07/2014 11:04 AM 02/07/2014 10:39 PM Full Code [id number]  Hewitt Shorts, MD Inpatient   12/26/2012 11:54 PM 01/05/2013  3:34 PM Full Code [id number]  Ron Parker, MD ED     Family Communication: husband at the bedside Disposition Plan: Husband would like physical therapy to reevaluate and then he'll make a decision on rehabilitation versus home with home  health  Antibiotics:  po Cipro  Time spent: 24 minutes  Alford Highland  [employer]

## 2017-05-15 NOTE — Consult Note (Signed)
WOC Nurse wound follow up Spoke with Dr. Ulice Bold and will change NPWT Friday. Will switch to new device at that time. Bedside RN, Malka informed and will notify patient and spouse.  WOC team will follow. Maple Hudson RN BSN CWON Pager [phone]

## 2017-05-15 NOTE — Care Management (Signed)
Spoke with Michelle Cabrera at the bedside. States she wants physical therapy to work with his wife again today before he makes up his mind about Home with Home Health vs Skilled Nursing facility. Telephone call to [name] ([facility]) per [name]. Discussed above information [name] RN MSN CCM Care Management [phone]

## 2017-05-15 NOTE — Progress Notes (Signed)
Physical Therapy Treatment Patient Details Name: Michelle Cabrera MRN: [id number] DOB: [birth year] Today's Date: 05/15/2017    History of Present Illness Pt. is a 71 y.o female who was admitted to [facility] with UTI, and confusion. Pt. was recently admitted for a decubitus ulcer over olecranon with exposure of orthopedic hardware treatment of right arm wound.  pt. PMHx includes: Anxiety, chronic low back pain, CPD, Depression, wound vac in Right UE, cervicalgia, Collagen vascular disorder, COPD, DDD, Depression, DM, RA, and Schizophrenia Spectrum Disorder    PT Comments    Pt is making slow progress with goals. [name] requested re-assessment of pt's functional status. Pt performed supine there-ex and bed mobility with mod assist, sat at the EOB for several minutes. Pt limited in PT participation due to lethargy and pain. [name] was present throughout session to provide encouragement.    Follow Up Recommendations  SNF     Equipment Recommendations  None recommended by PT    Recommendations for Other Services       Precautions / Restrictions Precautions Precautions: Fall;Other (comment) (R elbow) Restrictions Weight Bearing Restrictions: No    Mobility  Bed Mobility Overal bed mobility: Needs Assistance Bed Mobility: Supine to Sit     Supine to sit: Mod assist     General bed mobility comments: Pt required mod assistance to guide LEs off bed and lift shoulders off bed, needed assist to scoot to the EOB.  Pt appeared very fatigued and in discomfort throughout.  Transfers                 General transfer comment: Deferred, pt demonstrates decreased strength and trunk control, fall risk  Ambulation/Gait             General Gait Details: Deferred   Stairs            Wheelchair Mobility    Modified Rankin (Stroke Patients Only)       Balance Overall balance assessment: Needs assistance Sitting-balance support: Feet supported Sitting  balance-Leahy Scale: Fair Sitting balance - Comments: Pt requires assistance to get into seated position at EOB, able to maintain position for several seconds, appeared uncomfortable       Standing balance comment: Deferred                            Cognition Arousal/Alertness: Lethargic Behavior During Therapy: Agitated Overall Cognitive Status: Within Functional Limits for tasks assessed                                        Exercises Other Exercises Other Exercises: Therex 10, B ankle pumps, SLRs, min assist to help initiate movement    General Comments        Pertinent Vitals/Pain Pain Assessment: Faces Pain Score: 4  Faces Pain Scale: Hurts little more Pain Intervention(s): Limited activity within patient's tolerance;Monitored during session;Repositioned    Home Living                      Prior Function            PT Goals (current goals can now be found in the care plan section) Acute Rehab PT Goals Patient Stated Goal: to get stronger PT Goal Formulation: With patient Time For Goal Achievement: 05/26/17 Potential to Achieve Goals: Fair Progress towards PT goals: Progressing  toward goals    Frequency    Min 2X/week      PT Plan Current plan remains appropriate    Co-evaluation              AM-PAC PT "6 Clicks" Daily Activity  Outcome Measure  Difficulty turning over in bed (including adjusting bedclothes, sheets and blankets)?: Unable Difficulty moving from lying on back to sitting on the side of the bed? : Unable Difficulty sitting down on and standing up from a chair with arms (e.g., wheelchair, bedside commode, etc,.)?: Unable Help needed moving to and from a bed to chair (including a wheelchair)?: A Lot Help needed walking in hospital room?: A Little Help needed climbing 3-5 steps with a railing? : A Lot 6 Click Score: 10    End of Session   Activity Tolerance: Patient limited by  lethargy Patient left: in bed;with call bell/phone within reach;with bed alarm set;with family/visitor present   PT Visit Diagnosis: Other abnormalities of gait and mobility (R26.89);Muscle weakness (generalized) (M62.81);Pain     Time: 1610-9604 PT Time Calculation (min) (ACUTE ONLY): 11 min  Charges:                       G Codes:  Functional Assessment Tool Used: AM-PAC 6 Clicks Basic Mobility Functional Limitation: Mobility: Walking and moving around Mobility: Walking and Moving Around Current Status (V4098): At least 60 percent but less than 80 percent impaired, limited or restricted Mobility: Walking and Moving Around Goal Status [phone]): At least 40 percent but less than 60 percent impaired, limited or restricted    Renford Dills, SPT   Renford Dills 05/15/2017, 4:46 PM

## 2017-05-15 NOTE — Progress Notes (Signed)
Per [facility] admissions coordinator at [employer] they will have a wound vac for patient.   [employer], LCSW [phone]

## 2017-05-16 LAB — CREATININE, SERUM
Creatinine, Ser: 0.48 mg/dL (ref 0.44–1.00)
GFR calc Af Amer: >60 mL/min (ref >60)
GFR calc non Af Amer: >60 mL/min (ref >60)

## 2017-05-16 MED ORDER — INFLUENZA VAC SPLIT HIGH-DOSE 0.5 ML IM SUSY
0.5000 mL | PREFILLED_SYRINGE | Freq: Once | INTRAMUSCULAR | Status: AC
  Administered 2017-05-16: 12:00:00 0.5 mL via INTRAMUSCULAR
  Filled 2017-05-16: qty 0.5

## 2017-05-16 MED ORDER — QUETIAPINE FUMARATE 25 MG PO TABS: 25.0000 mg | ORAL_TABLET | Freq: Every day | ORAL | 0 refills | Status: DC

## 2017-05-16 MED ORDER — PREDNISONE 10 MG PO TABS: ORAL_TABLET | ORAL | 0 refills | Status: DC

## 2017-05-16 MED ORDER — OLANZAPINE 5 MG PO TABS: 5.0000 mg | ORAL_TABLET | Freq: Every day | ORAL | 0 refills | Status: DC

## 2017-05-16 MED ORDER — CIPROFLOXACIN HCL 500 MG PO TABS: 500.0000 mg | ORAL_TABLET | Freq: Two times a day (BID) | ORAL | 0 refills | Status: DC

## 2017-05-16 MED ORDER — HYDROCODONE-ACETAMINOPHEN 5-325 MG PO TABS: 1.0000 | ORAL_TABLET | Freq: Four times a day (QID) | ORAL | 0 refills | Status: AC | PRN

## 2017-05-16 MED ORDER — METOPROLOL TARTRATE 25 MG PO TABS
25.0000 mg | ORAL_TABLET | Freq: Two times a day (BID) | ORAL | Status: DC
  Administered 2017-05-16: 25 mg via ORAL
  Filled 2017-05-16: qty 1

## 2017-05-16 MED ORDER — MEGESTROL ACETATE 40 MG PO TABS: 40.0000 mg | ORAL_TABLET | Freq: Every day | ORAL | 0 refills | Status: DC

## 2017-05-16 MED ORDER — MORPHINE SULFATE ER 15 MG PO TBCR: 15.0000 mg | EXTENDED_RELEASE_TABLET | Freq: Two times a day (BID) | ORAL | 0 refills | Status: AC

## 2017-05-16 NOTE — Consult Note (Signed)
WOC Nurse wound follow up Wound type:S/P surgical debridement by Dr. Ulice Bold, new orders to change NPWT dressing and switch from Medela to [employer] NPWT system.  Medela system is bagged and left in patient room for spouse to return.  Measurement: 3 cm x 2 cm x0.2 cm with bone visible in wound bed  Will protect with silicone contact layer Wound bed: 98% beefy red granulation tissue with 2%  bone in wound bed  Drainage (amount, consistency, odor) minimal serosanguinous  No odor Periwound:medical adhesive related skin injury (MARSI) to periwound at 3 o'clock 0.3 cm in diameter, protected with contact layer Dressing procedure/placement/frequency:Cleanse wound to right elbow with NS and pat dry.  Cover wound bed with contact layer and fill with black foam.  Bridge dressing away from wound bed to avoid trauma from Trac pad.Change Mon/Wed/Fri.  WOC team will follow.   Maple Hudson RN BSN CWON Pager [phone]

## 2017-05-16 NOTE — Discharge Summary (Signed)
[facility] - [place] [facility]   PATIENT NAME: Michelle Cabrera    MR#:  [id number]  DATE OF BIRTH:  1945-09-20  DATE OF ADMISSION:  05/09/2017 ADMITTING PHYSICIAN: Auburn Bilberry, MD  DATE OF DISCHARGE: 05/16/2017 12:12 PM  PRIMARY CARE PHYSICIAN: Lauro Regulus, MD    ADMISSION DIAGNOSIS:  Lower urinary tract infectious disease [N39.0] Leukocytosis, unspecified type [D72.829]  DISCHARGE DIAGNOSIS:  Principal Problem:   Subacute delirium Active Problems:   UTI (urinary tract infection)   Chronic prescription opiate use   Constipation   SECONDARY DIAGNOSIS:   Past Medical History:  Diagnosis Date  . Anxiety   . Arthritis   . Cataract   . Cervicalgia   . Chronic lower back pain   . Closed compression fracture of L1 lumbar vertebra (HCC) 11/2012  . Collagen vascular disease (HCC)   . COPD (chronic obstructive pulmonary disease) (HCC)    unspecified  . DDD (degenerative disc disease)   . Depression   . Diabetes mellitus (HCC)   . Disorder of bursae and tendons in shoulder region    unspecified  . Fall 09/27/2016   Fall Feb 04, 2016 with broken vertebra  . GERD (gastroesophageal reflux disease)   . Headache   . Hereditary and idiopathic peripheral neuropathy    unspecified  . History of adenomatous polyp of colon    Followed by Dr. Markham Jordan  . History of tubal ligation   . Hypertension   . Intermediate coronary syndrome (HCC)   . Lumbago   . Neuralgia and neuritis, unspecified    radiculitis unspecified  . Rheumatoid arthritis (HCC)   . Rheumatoid arthritis (HCC)   . Senile osteoporosis   . Spinal stenosis, lumbar region without neurogenic claudication     HOSPITAL COURSE:   1.  Acute delirium. This has resolved. Patient's dose of Seroquel was decreased down to 25 mg. Zyprexa was added. The patient was on MS Contin 15 mg twice a day. 2. Rheumatoid arthritis flare in the fingers. I started low-dose prednisone and will taper this over  time. 3. Hypokalemia and hypomagnesemia these were replaced. Appetite better. Started on Megace. 4. Poor appetite and failure to thrive. CT scan of the chest,abdomen and pelvis negative for malignancy. Megace started. Risk of blood clot mentioned. Patient started eating a little bit better. Aspirin was discontinued.  5. Partially treated Serratia UTI. Rocephin changed over to Cipro by mouth. Our urine culture here was negative. 6. Weakness. Physical therapy recommended rehabilitation but family wanted to take the patient home. Home health set up. 7. Depression. Continue psychiatric medications 8. Nonhealing ulcer on the right arm. Wound VAC continued.  Patient surgeon came to evaluate. 9. Tachycardia metoprolol restarted. It was held yesterday secondary to relative hypotension.   DISCHARGE CONDITIONS:  fair  CONSULTS OBTAINED:  Treatment Team:  Clapacs, Jackquline Denmark, MD  DRUG ALLERGIES:   Allergies  Allergen Reactions  . Ambien [Zolpidem Tartrate] Other (See Comments)    Over sedation   . Dilaudid [Hydromorphone Hcl] Other (See Comments)    Made tongue swell  . Gold-Containing Drug Products Anaphylaxis  . Adhesive [Tape] Other (See Comments)    Tears skin  . Haldol [Haloperidol] Other (See Comments)    Hallucinations    DISCHARGE MEDICATIONS:   Discharge Medication List as of 05/16/2017 11:26 AM    START taking these medications   Details  ciprofloxacin (CIPRO) 500 MG tablet Take 1 tablet (500 mg total) by mouth 2 (two) times daily., Starting  Fri 05/16/2017, Print    megestrol (MEGACE) 40 MG tablet Take 1 tablet (40 mg total) by mouth daily., Starting Fri 05/16/2017, Print    morphine (MS CONTIN) 15 MG 12 hr tablet Take 1 tablet (15 mg total) by mouth every 12 (twelve) hours., Starting Fri 05/16/2017, Print    OLANZapine (ZYPREXA) 5 MG tablet Take 1 tablet (5 mg total) by mouth at bedtime., Starting Fri 05/16/2017, Print    predniSONE (DELTASONE) 10 MG tablet Take 2 tabs daily  for 3 days; take one tab daily for 3 days; take 1/2 tab daily for 4 days, Print      CONTINUE these medications which have CHANGED   Details  HYDROcodone-acetaminophen (NORCO) 5-325 MG tablet Take 1 tablet by mouth every 6 (six) hours as needed for moderate pain., Starting Fri 05/16/2017, Print    QUEtiapine (SEROQUEL) 25 MG tablet Take 1 tablet (25 mg total) by mouth at bedtime., Starting Fri 05/16/2017, Print      CONTINUE these medications which have NOT CHANGED   Details  albuterol (PROVENTIL HFA;VENTOLIN HFA) 108 (90 Base) MCG/ACT inhaler Inhale 2 puffs into the lungs every 6 (six) hours as needed for wheezing or shortness of breath., Historical Med    Calcium Carbonate-Vitamin D 600-400 MG-UNIT tablet Take 1 tablet by mouth daily. , Historical Med    !! DULoxetine (CYMBALTA) 30 MG capsule Take 30 mg by mouth at bedtime. *Note dose*, Historical Med    !! DULoxetine (CYMBALTA) 60 MG capsule Take 60 mg by mouth every morning. *Note dose*, Historical Med    ferrous sulfate 325 (65 FE) MG tablet Take 1 tablet (325 mg total) by mouth 2 (two) times daily with a meal., Starting Fri 02/21/2017, Print    Fluticasone Furoate-Vilanterol (BREO ELLIPTA) 100-25 MCG/INH AEPB Inhale 1 puff into the lungs every morning., Historical Med    folic acid (FOLVITE) 1 MG tablet Take 1 mg by mouth daily., Historical Med    METHOTREXATE SODIUM, PF, IJ Inject 0.8 mLs into the skin once a week. Methotrexate sodium solution (25 mg/ ml ) Give 0.8 ml on Friday., Historical Med    metoprolol tartrate (LOPRESSOR) 25 MG tablet Take 25 mg by mouth 2 (two) times daily., Historical Med    Nutritional Supplements (ENSURE ENLIVE PO) Take 1 Bottle by mouth 2 (two) times daily between meals., Historical Med    omeprazole (PRILOSEC) 20 MG capsule Take 20 mg by mouth 2 (two) times daily. , Historical Med    vitamin C (ASCORBIC ACID) 500 MG tablet Take 500 mg by mouth daily., Historical Med    acetaminophen (TYLENOL)  500 MG tablet Take 1,000 mg by mouth daily as needed for mild pain or headache. , Historical Med    bisacodyl (DULCOLAX) 10 MG suppository Place 1 suppository (10 mg total) rectally daily as needed for moderate constipation., Starting Fri 02/21/2017, Normal    gabapentin (NEURONTIN) 300 MG capsule Take 600 mg by mouth 2 (two) times daily as needed (restless legs). , Historical Med    polyethylene glycol (MIRALAX / GLYCOLAX) packet Take 17 g by mouth daily as needed for mild constipation. , Historical Med    senna (SENOKOT) 8.6 MG tablet Take 1 tablet by mouth daily as needed for constipation. , Historical Med    trolamine salicylate (ASPERCREME) 10 % cream Apply 1 application topically 2 (two) times daily as needed for muscle pain., Historical Med     !! - Potential duplicate medications found. Please discuss with provider.  STOP taking these medications     aspirin EC 81 MG tablet      etanercept (ENBREL SURECLICK) 50 MG/ML injection      Cholecalciferol 4000 units CAPS      enoxaparin (LOVENOX) 40 MG/0.4ML injection      ibuprofen (ADVIL,MOTRIN) 200 MG tablet      oxymorphone (OPANA) 5 MG tablet          DISCHARGE INSTRUCTIONS:   Follow-up PMD one week Follow-up pain management as outpatient  If you experience worsening of your admission symptoms, develop shortness of breath, life threatening emergency, suicidal or homicidal thoughts you must seek medical attention immediately by calling 911 or calling your MD immediately  if symptoms less severe.  You Must read complete instructions/literature along with all the possible adverse reactions/side effects for all the Medicines you take and that have been prescribed to you. Take any new Medicines after you have completely understood and accept all the possible adverse reactions/side effects.   Please note  You were cared for by a hospitalist during your hospital stay. If you have any questions about your discharge  medications or the care you received while you were in the hospital after you are discharged, you can call the unit and asked to speak with the hospitalist on call if the hospitalist that took care of you is not available. Once you are discharged, your primary care physician will handle any further medical issues. Please note that NO REFILLS for any discharge medications will be authorized once you are discharged, as it is imperative that you return to your primary care physician (or establish a relationship with a primary care physician if you do not have one) for your aftercare needs so that they can reassess your need for medications and monitor your lab values.    Today   CHIEF COMPLAINT:   Chief Complaint  Patient presents with  . Urinary Tract Infection    HISTORY OF PRESENT ILLNESS:  Michelle Cabrera  is a 71 y.o. female brought in with failed outpatient treatment for UTI   VITAL SIGNS:  Blood pressure 135/71, pulse (!) 113, temperature 97.9 F (36.6 C), temperature source Oral, resp. rate 16, height 5\' 2"  (1.575 m), weight 56.7 kg (125 lb), SpO2 96 %.    PHYSICAL EXAMINATION:  GENERAL:  71 y.o.-year-old patient lying in the bed with no acute distress.  EYES: Pupils equal, round, reactive to light and accommodation. No scleral icterus. Extraocular muscles intact.  HEENT: Head atraumatic, normocephalic. Oropharynx and nasopharynx clear.  NECK:  Supple, no jugular venous distention. No thyroid enlargement, no tenderness.  LUNGS: Normal breath sounds bilaterally, no wheezing, rales,rhonchi or crepitation. No use of accessory muscles of respiration.  CARDIOVASCULAR: S1, S2 tachycardia. No murmurs, rubs, or gallops.  ABDOMEN: Soft, non-tender, non-distended. Bowel sounds present. No organomegaly or mass.  EXTREMITIES: No pedal edema, cyanosis, or clubbing.  NEUROLOGIC: Cranial nerves II through XII are intact. PSYCHIATRIC: The patient is alert and oriented.  SKIN: right elbow  nonhealing lesion with wound VAC on it  DATA REVIEW:   CBC  Recent Labs Lab 05/14/17 0806  WBC 11.8*  HGB 11.0*  HCT 33.1*  PLT 715*    Chemistries   Recent Labs Lab 05/15/17 0734 05/16/17 0503  NA 135  --   K 4.0  --   CL 104  --   CO2 24  --   GLUCOSE 154*  --   BUN 14  --   CREATININE 0.68 0.48  CALCIUM 8.1*  --   MG 2.3  --      Microbiology Results  Results for orders placed or performed during the hospital encounter of 05/09/17  Urine Culture     Status: Abnormal   Collection Time: 05/09/17  3:19 PM  Result Value Ref Range Status   Specimen Description URINE, RANDOM  Final   Special Requests NONE  Final   Culture (A)  Final    <10,000 COLONIES/mL INSIGNIFICANT GROWTH Performed at [facility], [address] [place], [place] [address]    Report Status 05/11/2017 FINAL  Final      Management plans discussed with the patient, family and they are in agreement.  CODE STATUS:  Code Status History    Date Active Date Inactive Code Status Order ID Comments User Context   05/09/2017 10:15 PM 05/16/2017  4:17 PM Full Code [id number]  Auburn Bilberry, MD Inpatient   02/18/2017  5:30 AM 02/22/2017  3:44 PM Full Code [id number]  Ihor Austin, MD Inpatient   10/01/2016  1:20 PM 10/01/2016  6:20 PM Full Code [id number]  Kennedy Bucker, MD Inpatient   02/04/2016  4:31 PM 02/09/2016  5:14 PM Full Code [id number]  Marguarite Arbour, MD Inpatient   11/10/2015  4:58 AM 11/11/2015  6:48 PM Full Code [id number]  Enedina Finner, MD Inpatient   09/06/2015  4:04 AM 09/08/2015  8:04 PM Full Code [id number]  Ihor Austin, MD ED   07/26/2015  3:51 PM 08/01/2015  4:32 PM Full Code [id number]  Shaune Pollack, MD Inpatient   09/15/2014  6:36 PM 09/16/2014  5:22 PM Full Code [id number]  Dominica Severin, MD Inpatient   02/07/2014 11:04 AM 02/07/2014 10:39 PM Full Code [id number]  Hewitt Shorts, MD Inpatient   12/26/2012 11:54 PM 01/05/2013  3:34 PM Full Code [id number]  Ron Parker, MD ED       TOTAL TIME TAKING CARE OF THIS PATIENT: 35 minutes.    Alford Highland M.D on 05/16/2017 at 5:09 PM  Between 7am to 6pm - Pager - [phone]  After 6pm go to [email or web address] - password [email or web address]  [facility] Office  [phone]  CC: Primary care physician; Lauro Regulus, MD

## 2017-05-16 NOTE — Care Management (Signed)
Discharge to home today per Dr. Renae Gloss. Will be followed by [facility] for skilled nursing, occupational therapy and physical therapy. Will update Vira Agar, [place] representative. Will be transported home per private car. [name] RN MSN CCM Care Management [phone]

## 2017-05-16 NOTE — Progress Notes (Signed)
Patient discharged home with home health. Prescriptions given to patient. All discharge instructions given and all questions answered. 

## 2017-05-16 NOTE — Progress Notes (Addendum)
Per RN case manager patient's husband Michelle Cabrera has chosen to take patient home with home health today.  CSW received a call back from patient's husband Michelle Cabrera who confirmed that he will take patient home with home health today. Please reconsult if future social work needs arise. CSW signing off.    [employer], LCSW [phone]

## 2017-05-16 NOTE — Progress Notes (Signed)
Per RN case manager patient's husband Deniece Portela is going to decide today between home health and [employer]. Clinical Child psychotherapist (CSW) attempted to contact patient's husband Deniece Portela however he did not answer and a voicemail was left. CSW will continue to follow and assist as needed.   [employer], LCSW [phone]

## 2017-05-19 ENCOUNTER — Other Ambulatory Visit: Payer: Self-pay | Admitting: Internal Medicine

## 2017-05-19 DIAGNOSIS — Z1231 Encounter for screening mammogram for malignant neoplasm of breast: Secondary | ICD-10-CM

## 2017-05-21 NOTE — Progress Notes (Signed)
05/23/2017 9:48 PM   Michelle Cabrera 07-25-1946 [id number]  Referring provider: Kirk Ruths, MD [facility] [place], [place] [address]  Chief Complaint  Patient presents with  . New Patient (Initial Visit)  . Urinary Tract Infection    HPI: Patient is a 71 year old Caucasian female who is referred to [place] by, Dr. Frazier Richards, for recurrent urinary tract infections with her daughter,  Michelle Cabrera.  Patient states that she has had several urinary tract infections over the last year.  Reviewing her records, she has had greater than three documented UTI's over the last year.    Her symptoms with a urinary tract infection consist of dysuria, increased frequency and mental confusion.  She denies dysuria, gross hematuria, suprapubic pain, back pain, abdominal pain or flank pain at this time.  She has not had any recent fevers, chills, nausea or vomiting.   Today, she complains of frequency, dysuria and nocturia.  Her PVR is 77 mL.  She states she is having no symptoms of an urinary tract infection today.  She does not have a history of nephrolithiasis, GU surgery or GU trauma.        She is postmenopausal.  She does not have a history of breast cancer, stroke or blood clots.  She denies constipation and/or diarrhea.  She does engage in good perineal hygiene. She does not take tub baths.   She has incontinence.  She is using incontinence pads. 3 depends a day.   She is not having pain with bladder filling.    Contrast CT performed in 05/14/2017 noted chronic left adrenal gland thickening is stable since 2016. Negative right adrenal gland. Small chronic left renal cyst. Bilateral renal enhancement and contrast excretion is within normal limits. No hydronephrosis or hydroureter.  Diminutive and unremarkable urinary bladder.     She is not drinking a lot of water daily.  "Sips here and there."  Cranberry juices.      PMH: Past Medical History:    Diagnosis Date  . Anxiety   . Arthritis   . Cataract   . Cervicalgia   . Chronic lower back pain   . Closed compression fracture of L1 lumbar vertebra ([place]) 11/2012  . Collagen vascular disease ([place])   . COPD (chronic obstructive pulmonary disease) ([place])    unspecified  . DDD (degenerative disc disease)   . Depression   . Diabetes mellitus ([place])   . Disorder of bursae and tendons in shoulder region    unspecified  . Fall 09/27/2016   Fall Feb 04, 2016 with broken vertebra  . GERD (gastroesophageal reflux disease)   . Headache   . Hereditary and idiopathic peripheral neuropathy    unspecified  . History of adenomatous polyp of colon    Followed by Dr. Tiffany Kocher  . History of tubal ligation   . Hypertension   . Intermediate coronary syndrome ([place])   . Lumbago   . Neuralgia and neuritis, unspecified    radiculitis unspecified  . Rheumatoid arthritis ([place])   . Rheumatoid arthritis ([place])   . Senile osteoporosis   . Spinal stenosis, lumbar region without neurogenic claudication     Surgical History: Past Surgical History:  Procedure Laterality Date  . BACK SURGERY    . BREAST BIOPSY Left    negative  . CATARACT EXTRACTION W/ INTRAOCULAR LENS  IMPLANT, BILATERAL Bilateral   . COLONOSCOPY  08/17/2007   Adenomatous Polyps  . COLONOSCOPY  10/09/2010  PH Adenomatous polyps : [facility] 09/2015 ; Recall Ltr mailed 08/17/2015 (dw)  . ESOPHAGOGASTRODUODENOSCOPY  01/14/2008   No repeat per RTE  . FOOT SURGERY Bilateral   . HAND TENDON SURGERY Right 09/15/2014   "d/t RA"  . HUMERUS FRACTURE SURGERY Left   . LUMBAR [place] SURGERY  2014    X 3  . LUMBAR LAMINECTOMY    . POSTERIOR LUMBAR FUSION     "got screws in"  . TONSILLECTOMY    . TUBAL LIGATION    . WRIST FUSION Right    30 yrs ago    Home Medications:  Allergies as of 05/23/2017      Reactions   Ambien [zolpidem Tartrate] Other (See Comments)   Over sedation    Dilaudid [hydromorphone Hcl] Other (See Comments)   Made tongue  swell   Gold-containing Drug Products Anaphylaxis   Adhesive [tape] Other (See Comments)   Tears skin   Haldol [haloperidol] Other (See Comments)   Hallucinations      Medication List        Accurate as of 05/23/17 11:59 PM. Always use your most recent med list.          acetaminophen 500 MG tablet Commonly known as:  TYLENOL Take 1,000 mg by mouth daily as needed for mild pain or headache.   albuterol 108 (90 Base) MCG/ACT inhaler Commonly known as:  PROVENTIL HFA;VENTOLIN HFA Inhale 2 puffs into the lungs every 6 (six) hours as needed for wheezing or shortness of breath.   bisacodyl 10 MG suppository Commonly known as:  DULCOLAX Place 1 suppository (10 mg total) rectally daily as needed for moderate constipation.   BREO ELLIPTA 100-25 MCG/INH Aepb Generic drug:  fluticasone furoate-vilanterol Inhale 1 puff into the lungs every morning.   Calcium Carbonate-Vitamin D 600-400 MG-UNIT tablet Take 1 tablet by mouth daily.   ciprofloxacin 500 MG tablet Commonly known as:  CIPRO Take 1 tablet (500 mg total) by mouth 2 (two) times daily.   conjugated estrogens vaginal cream Commonly known as:  PREMARIN Apply 0.43m (pea-sized amount)  just inside the vaginal introitus with a finger-tip Monday, Wednesday and Friday nights.   DULoxetine 60 MG capsule Commonly known as:  CYMBALTA Take 60 mg by mouth every morning. *Note dose*   DULoxetine 30 MG capsule Commonly known as:  CYMBALTA Take 30 mg by mouth at bedtime. *Note dose*   ENSURE ENLIVE PO Take 1 Bottle by mouth 2 (two) times daily between meals.   estradiol 0.1 MG/GM vaginal cream Commonly known as:  ESTRACE VAGINAL Apply 0.576m(pea-sized amount)  just inside the vaginal introitus with a finger-tip every night for two weeks and then Monday, Wednesday and Friday nights.   ferrous sulfate 325 (65 FE) MG tablet Take 1 tablet (325 mg total) by mouth 2 (two) times daily with a meal.   folic acid 1 MG tablet Commonly  known as:  FOLVITE Take 1 mg by mouth daily.   gabapentin 300 MG capsule Commonly known as:  NEURONTIN Take 600 mg by mouth 2 (two) times daily as needed (restless legs).   HYDROcodone-acetaminophen 5-325 MG tablet Commonly known as:  NORCO Take 1 tablet by mouth every 6 (six) hours as needed for moderate pain.   megestrol 40 MG tablet Commonly known as:  MEGACE Take 1 tablet (40 mg total) by mouth daily.   METHOTREXATE SODIUM (PF) IJ Inject 0.8 mLs into the skin once a week. Methotrexate sodium solution (25 mg/ ml ) Give 0.8  ml on Friday.   metoprolol tartrate 25 MG tablet Commonly known as:  LOPRESSOR Take 25 mg by mouth 2 (two) times daily.   morphine 15 MG 12 hr tablet Commonly known as:  MS CONTIN Take 1 tablet (15 mg total) by mouth every 12 (twelve) hours.   OLANZapine 5 MG tablet Commonly known as:  ZYPREXA Take 1 tablet (5 mg total) by mouth at bedtime.   omeprazole 20 MG capsule Commonly known as:  PRILOSEC Take 20 mg by mouth 2 (two) times daily.   polyethylene glycol packet Commonly known as:  MIRALAX / GLYCOLAX Take 17 g by mouth daily as needed for mild constipation.   predniSONE 10 MG tablet Commonly known as:  DELTASONE Take 2 tabs daily for 3 days; take one tab daily for 3 days; take 1/2 tab daily for 4 days   QUEtiapine 25 MG tablet Commonly known as:  SEROQUEL Take 1 tablet (25 mg total) by mouth at bedtime.   senna 8.6 MG tablet Commonly known as:  SENOKOT Take 1 tablet by mouth daily as needed for constipation.   trolamine salicylate 10 % cream Commonly known as:  ASPERCREME Apply 1 application topically 2 (two) times daily as needed for muscle pain.   vitamin C 500 MG tablet Commonly known as:  ASCORBIC ACID Take 500 mg by mouth daily.       Allergies:  Allergies  Allergen Reactions  . Ambien [Zolpidem Tartrate] Other (See Comments)    Over sedation   . Dilaudid [Hydromorphone Hcl] Other (See Comments)    Made tongue swell  .  Gold-Containing Drug Products Anaphylaxis  . Adhesive [Tape] Other (See Comments)    Tears skin  . Haldol [Haloperidol] Other (See Comments)    Hallucinations    Family History: Family History  Problem Relation Age of Onset  . CAD Father   . Alcohol abuse Father   . Heart disease Father   . CAD Mother   . Hypertension Mother   . Peripheral vascular disease Mother   . Osteoarthritis Mother   . Heart disease Mother   . Breast cancer Maternal Aunt 98  . Alcohol abuse Sister   . Arthritis Sister   . Cancer Sister   . Kidney cancer Neg Hx   . Kidney disease Neg Hx   . Prostate cancer Neg Hx     Social History:  reports that she quit smoking about 22 months ago. Her smoking use included cigarettes. She has a 56.00 pack-year smoking history. she has never used smokeless tobacco. She reports that she does not drink alcohol or use drugs.  ROS: UROLOGY Frequent Urination?: Yes Hard to postpone urination?: No Burning/pain with urination?: Yes Get up at night to urinate?: Yes Leakage of urine?: No Urine stream starts and stops?: No Trouble starting stream?: No Do you have to strain to urinate?: No Blood in urine?: No Urinary tract infection?: No Sexually transmitted disease?: No Injury to kidneys or bladder?: No Painful intercourse?: No Weak stream?: No Currently pregnant?: No Vaginal bleeding?: No Last menstrual period?: n  Gastrointestinal Nausea?: No Vomiting?: No Indigestion/heartburn?: No Diarrhea?: No Constipation?: No  Constitutional Fever: No Night sweats?: No Weight loss?: No Fatigue?: No  Skin Skin rash/lesions?: No Itching?: No  Eyes Blurred vision?: No Double vision?: No  Ears/Nose/Throat Sore throat?: No Sinus problems?: No  Hematologic/Lymphatic Swollen glands?: No Easy bruising?: No  Cardiovascular Leg swelling?: No Chest pain?: No  Respiratory Cough?: No Shortness of breath?: No  Endocrine Excessive  thirst?:  No  Musculoskeletal Back pain?: No Joint pain?: No  Neurological Headaches?: No Dizziness?: No  Psychologic Depression?: No Anxiety?: No  Physical Exam: BP 129/83   Pulse (!) 112   Ht 5' 2"  (1.575 m)   Wt 115 lb (52.2 kg)   BMI 21.03 kg/m   Constitutional: Well nourished. Alert and oriented, No acute distress. HEENT: [place] AT, moist mucus membranes. Trachea midline, no masses. Cardiovascular: No clubbing, cyanosis, or edema. Respiratory: Normal respiratory effort, no increased work of breathing. GI: Abdomen is soft, non tender, non distended, no abdominal masses. Liver and spleen not palpable.  No hernias appreciated.  Stool sample for occult testing is not indicated.   GU: No CVA tenderness.  No bladder fullness or masses.  Atrophic external genitalia, normal pubic hair distribution, no lesions.  Normal urethral meatus, no lesions, no prolapse, no discharge.   No urethral masses, tenderness and/or tenderness. No bladder fullness, tenderness or masses. Pale vagina mucosa, poor estrogen effect, no discharge, no lesions, good pelvic support, no cystocele or rectocele noted.  No cervical motion tenderness.  Uterus is freely mobile and non-fixed.  No adnexal/parametria masses or tenderness noted.  Anus and perineum are without rashes or lesions.    Skin: No rashes, bruises or suspicious lesions. Lymph: No cervical or inguinal adenopathy. Neurologic: Grossly intact, no focal deficits, moving all 4 extremities. Psychiatric: Normal mood and affect.  Laboratory Data: Lab Results  Component Value Date   WBC 11.8 (H) 05/14/2017   HGB 11.0 (L) 05/14/2017   HCT 33.1 (L) 05/14/2017   MCV 87.4 05/14/2017   PLT 715 (H) 05/14/2017    Lab Results  Component Value Date   CREATININE 0.48 05/16/2017    Lab Results  Component Value Date   HGBA1C 6.5 (H) 02/20/2017    Lab Results  Component Value Date   AST 31 05/09/2017   Lab Results  Component Value Date   ALT 11 (L) 05/09/2017    I have reviewed the labs.   Pertinent Imaging: CLINICAL DATA:  71 year old female with recent UTI. Confusion, subsequent delirium, nausea, vomiting.  EXAM: CT CHEST, ABDOMEN, AND PELVIS WITH CONTRAST  TECHNIQUE: Multidetector CT imaging of the chest, abdomen and pelvis was performed following the standard protocol during bolus administration of intravenous contrast.  CONTRAST:  189m ISOVUE-300 IOPAMIDOL (ISOVUE-300) INJECTION 61%  COMPARISON:  Chest radiographs 10/28/2016. Thoracic spine CT 02/04/2016. Lumbar MRI 07/13/2015. Chest CTA 02/26/2015.  FINDINGS: CT CHEST FINDINGS  Cardiovascular: Chronic Calcified aortic atherosclerosis. Calcified coronary artery atherosclerosis. Mild cardiomegaly. No pericardial effusion. Major mediastinal vascular structures that central mediastinal vascular structures appear patent.  Mediastinum/Nodes: Negative.  No mediastinal lymphadenopathy.  Lungs/Pleura: Major airways are patent. Right greater than left dependent pulmonary atelectasis. Centrilobular emphysema. Posterior basal segment lower lobe bronchiectasis (series 5, image 90). No pleural effusion or other abnormal pulmonary opacity.  Musculoskeletal: Osteopenia. Chronic thoracic compression fractures of T5, T6 and T12. Augmentation of the T12 vertebral body since July 2017 with chronic retropulsion of bone.  New since 02/04/2016 severe compression fractures of T8 and T10. The T10 level was mildly compressed on 10/28/2016. The T8 compression appears new since 10/28/2016, but is similarly sclerotic to the T10 level.  Proximal right humerus deformity appears chronic. Similar nonacute appearing intermittent left rib fractures.  CT ABDOMEN PELVIS FINDINGS  Hepatobiliary: Negative liver and gallbladder.  Pancreas: Negative.  Spleen: Negative.  Adrenals/Urinary Tract: Chronic left adrenal gland thickening is stable since 2016. Negative right adrenal gland.  Small chronic left renal  cyst. Bilateral renal enhancement and contrast excretion is within normal limits. No hydronephrosis or hydroureter.  Diminutive and unremarkable urinary bladder.  Stomach/Bowel: Decompressed rectosigmoid colon. Minimal sigmoid diverticular without active inflammation. Decompressed left colon. Oral contrast has reached the splenic flexure. Redundant transverse colon intermittently distended with gas and oral contrast. Abrupt caliber change in the distal transverse segment on coronal image 28 is felt to reflect bowel peristalsis. Interposed hepatic flexure between the liver and anterior diaphragm. The right colon is felt with oral contrast. Normal retrocecal appendix. Negative terminal ileum. No dilated small bowel. Negative stomach and duodenum.  No abdominal free fluid or free air.  Vascular/Lymphatic: Extensive Aortoiliac calcified atherosclerosis. Ectatic infrarenal abdominal aorta. Major arterial structures in the abdomen and pelvis appear patent.  Portal venous system appears patent.  No lymphadenopathy.  Reproductive: Negative.  Other: No pelvic free fluid. Trace right ventral abdominal wall subcutaneous gas (series 3, image 71) likely related to recent subcutaneous injection.  Musculoskeletal: Osteopenia. Chronic left pubic rami fractures. Proximal right femur ORIF. No acute osseous abnormality identified. Chronic L1 and L5 compression fractures previously augmented. Chronic postoperative changes to the L2-L3 and L3-L4 levels.  IMPRESSION: 1. No acute findings in the chest aside from dependent atelectasis. 2. Aortic Atherosclerosis (ICD10-I70.0) and Emphysema (ICD10-J43.9), and lower lobe Bronchiectasis. 3. No acute or inflammatory process identified in the abdomen or pelvis. No bowel obstruction. 4. Chronic vertebral compression fractures with progression in the thoracic spine since April 2018. 5. Chronic proximal right humerus  fracture and left rib fractures. Chronic postoperative changes to the lumbar spine.   Electronically Signed   By: Genevie Ann M.D.   On: 05/14/2017 12:06  Results for IZZABELLA, BESSE (MRN [id number]) as of 06/03/2017 21:42  Ref. Range 05/23/2017 11:34  Scan Result Unknown 77    I have independently reviewed the films.    Assessment & Plan:    1. Recurrent UTI's  - criteria for recurrent UTI has been met with 2 or more infections in 6 months or 3 or greater infections in one year   - Patient is instructed to increase their water intake until the urine is pale yellow or clear (10 to 12 cups daily)   - probiotics (yogurt, oral pills or vaginal suppositories), take cranberry pills or drink the juice and Vitamin C 1,000 mg daily to acidify the urine should be added to their daily regimen   - avoid soaking in tubs and wipe front to back after urinating   - advised them to have CATH UA's for urinalysis and culture to prevent skin contamination of the specimen  - reviewed symptoms of UTI and advised not to have urine checked or be treated for UTI if not experiencing symptoms  - discussed antibiotic stewardship with the patient    2. Vaginal atrophy  - I explained to the patient that when women go through menopause and her estrogen levels are severely diminished, the normal vaginal flora will change.  This is due to an increase of the vaginal canal's pH. Because of this, the vaginal canal may be colonized by bacteria from the rectum instead of the protective lactobacillus.  This accompanied by the loss of the mucus barrier with vaginal atrophy is a cause of recurrent urinary tract infections.  - In some studies, the use of vaginal estrogen cream has been demonstrated to reduce  recurrent urinary tract infections to one a year.   - Patient was given a prescription of vaginal estrogen cream (Premarin/Estrace) and instructed to  apply 0.17m (pea-sized amount)  just inside the vaginal introitus with a  finger-tip every night for two weeks and then Monday, Wednesday and Friday nights.  I explained to the patient that vaginally administered estrogen, which causes only a slight increase in the blood estrogen levels, have fewer contraindications and adverse systemic effects that oral HT.  - She will follow up in three months for an exam.                                            Return in about 3 months (around 08/23/2017) for OAB questionnaire, PVR and exam.  These notes generated with voice recognition software. I apologize for typographical errors.  SZara Council [facility] [address] [place] [phone]

## 2017-05-23 ENCOUNTER — Encounter: Payer: Self-pay | Admitting: Urology

## 2017-05-23 ENCOUNTER — Ambulatory Visit (INDEPENDENT_AMBULATORY_CARE_PROVIDER_SITE_OTHER): Payer: Medicare Other | Admitting: Urology

## 2017-05-23 VITALS — BP 129/83 | HR 112 | Ht 62.0 in | Wt 115.0 lb

## 2017-05-23 DIAGNOSIS — N390 Urinary tract infection, site not specified: Secondary | ICD-10-CM

## 2017-05-23 DIAGNOSIS — N952 Postmenopausal atrophic vaginitis: Secondary | ICD-10-CM

## 2017-05-23 LAB — BLADDER SCAN AMB NON-IMAGING: SCAN RESULT: 77

## 2017-05-23 MED ORDER — ESTROGENS, CONJUGATED 0.625 MG/GM VA CREA: TOPICAL_CREAM | VAGINAL | 12 refills | Status: AC

## 2017-05-23 MED ORDER — ESTRADIOL 0.1 MG/GM VA CREA
TOPICAL_CREAM | VAGINAL | 12 refills | Status: DC
Start: 2017-05-23 — End: 2018-01-09

## 2017-05-23 NOTE — Patient Instructions (Signed)
                                             Urinary Tract Infection Prevention Patient Education Stay Hydrated: Urinary tract infections (UTIs) are less likely to occur in someone who is drinking enough water to promote regular urination, so it is very important to stay hydrated in order to help flush out bacteria from the urinary tract. (1.5L) Respond to "Nature's Call": It is always a good idea to urinate as soon as you feel the need. While "holding it in" does not directly cause an infection, it can cause overdistension that can damage the lining of the bladder, making it more vulnerable to bacteria. (try voiding every two hours while awake) Remove Tampons Before Going: Remember to always take out tampons before urinating, and change tampons often.  Practice Proper Bathroom Hygiene: To keep bacteria near the urethral opening to a minimum, it is important to practice proper wiping techniques (i.e. front to back wiping) to help prevent rectal bacteria from entering the uretro-genital area. It can also be helpful to take showers and avoid soaking in the bathtub.  Take a Vitamin C Supplement: About 1,000 milligrams of vitamin C taken daily can help inhibit the growth of some bacteria by acidifying the urine. Maintain Control with Cranberries: Cranberries contain hippuronic acid, which is a natural antiseptic that may help prevent the adherence of bacteria to the bladder lining. Drinking 100% pure cranberry juice or taking over the counter cranberry supplements twice daily may help to prevent an infection. However, it is important to note that cranberry juices/supplements are not helpful once a urinary tract infection (UTI) is present. Strengthen Your Core: Often, a lazy bladder (unable to empty urine properly) occurs due to lower back problem, so consider doing exercises to help strengthen your back, pelvic floor, and stomach muscles. Take probiotics daily Pay Attention to Your Urine: Your urine can  change color for a variety of reasons, including from the medications you take, so pay close attention to it to monitor your overall health. One key thing to note is that if your urine is typically a darker yellow, your body is dehydrated, so you need to step up your water intake.   Using a pea sized amount with a fingertip apply the vaginal estrogen cream to just inside the vagina on Monday nights, Wednesday nights and Friday nights  If you should start to experience symptoms of a urinary tract infection, please call our office so that we may evaluate you promptly.

## 2017-05-26 ENCOUNTER — Ambulatory Visit: Payer: Self-pay | Admitting: Urology

## 2017-05-30 ENCOUNTER — Ambulatory Visit: Payer: Self-pay | Admitting: Urology

## 2017-06-03 ENCOUNTER — Encounter: Payer: Self-pay | Admitting: Urology

## 2017-06-13 ENCOUNTER — Telehealth: Payer: Self-pay | Admitting: Urology

## 2017-06-13 ENCOUNTER — Ambulatory Visit: Payer: Self-pay | Admitting: Urology

## 2017-06-13 NOTE — Telephone Encounter (Signed)
Spoke with pt daughter and offered pt to come into clinic for a cath specimen. Daughter declined stating pt lives in [place] and doesn't want to come out. Reinforced with daughter to increase fluid in take, can take cranberry tablets or juice, and if pt spikes a fever 101+ should seek tx at the ER. Daughter voiced understanding of whole conversation.

## 2017-06-13 NOTE — Telephone Encounter (Signed)
Pt is having UTI symptoms. Taking a little crazy.  No burning, daughter thinks its the start of a UTI.  She wanted to know if she could come in today on nurse schedule this afternoon.

## 2017-06-16 ENCOUNTER — Telehealth: Payer: Self-pay | Admitting: Urology

## 2017-06-16 NOTE — Telephone Encounter (Signed)
Pt dghtr, Michelle Cabrera, calls office Re: Michelle Cabrera stating she spoke to a nurse on Friday and would like to speak to a nurse again before 5pm today please.  Please advise. Thanks. Michelle Cabrera asks to call her work # At [phone] or her cell # at [phone]

## 2017-06-17 ENCOUNTER — Other Ambulatory Visit: Payer: Self-pay | Admitting: Urology

## 2017-06-17 ENCOUNTER — Ambulatory Visit (INDEPENDENT_AMBULATORY_CARE_PROVIDER_SITE_OTHER): Payer: Medicare Other

## 2017-06-17 VITALS — BP 106/71 | HR 120 | Ht 62.0 in | Wt 120.0 lb

## 2017-06-17 DIAGNOSIS — N390 Urinary tract infection, site not specified: Secondary | ICD-10-CM | POA: Diagnosis not present

## 2017-06-17 LAB — URINALYSIS, COMPLETE
Bilirubin, UA: NEGATIVE
Glucose, UA: NEGATIVE
NITRITE UA: POSITIVE — AB
PH UA: 6 (ref 5.0–7.5)
RBC, UA: NEGATIVE
Specific Gravity, UA: 1.025 (ref 1.005–1.030)
UUROB: 2 mg/dL — AB (ref 0.2–1.0)

## 2017-06-17 LAB — MICROSCOPIC EXAMINATION: RBC MICROSCOPIC, UA: NONE SEEN /HPF (ref 0–?)

## 2017-06-17 MED ORDER — CEPHALEXIN 500 MG PO CAPS: 500.0000 mg | ORAL_CAPSULE | Freq: Two times a day (BID) | ORAL | 0 refills | Status: DC

## 2017-06-17 NOTE — Progress Notes (Signed)
In and Out Catheterization  Patient is present today for a I & O catheterization due to UTI. Patient was cleaned and prepped in a sterile fashion with betadine and Lidocaine 2% jelly was instilled into the urethra.  A 14FR cath was inserted no complications were noted , 23ml of urine return was noted, urine was dark orange in color. A clean urine sample was collected for UA and culture. Bladder was drained  And catheter was removed with out difficulty.    Preformed by: Eligha Bridegroom, CMA  Follow up/ Additional notes: will call with results   Per Carollee Herter patient's daughter Misty Stanley was notified that UA was frankly positive for infection and we would send Keflex to patient's pharm to start and will call with culture results monday

## 2017-06-17 NOTE — Telephone Encounter (Signed)
Pt is coming in today for a cath specimen.

## 2017-06-20 LAB — CULTURE, URINE COMPREHENSIVE

## 2017-06-23 ENCOUNTER — Telehealth: Payer: Self-pay

## 2017-06-23 MED ORDER — SULFAMETHOXAZOLE-TRIMETHOPRIM 800-160 MG PO TABS: 1.0000 | ORAL_TABLET | Freq: Two times a day (BID) | ORAL | 0 refills | Status: AC

## 2017-06-23 NOTE — Telephone Encounter (Signed)
Spoke with pt daughter in reference to switch abx. Daughter voiced understanding. Medication sent to pharmacy.  Daughter inquired about needing further testing or a cysto to help find out why pt is getting UTIs so often. Please advise.

## 2017-06-23 NOTE — Telephone Encounter (Signed)
-----   Message from Harle Battiest, PA-C sent at 06/20/2017  4:21 PM EST ----- Please let Michelle Cabrera know that her urine culture is positive, but the bacteria is resistant to the Keflex.  She needs to discontinue the Kelfex and start Septra DS, one tablet twice daily x seven days.

## 2017-06-23 NOTE — Telephone Encounter (Signed)
We can schedule a cystoscopy once her UTI has been treated.

## 2017-06-24 NOTE — Telephone Encounter (Signed)
Spoke with pt daughter in reference to scheduling a cysto post UTI tx. Daughter voiced understanding. Daughter will call back to make appt.

## 2017-07-01 ENCOUNTER — Ambulatory Visit: Payer: Medicare Other

## 2017-07-04 ENCOUNTER — Ambulatory Visit (INDEPENDENT_AMBULATORY_CARE_PROVIDER_SITE_OTHER): Payer: Medicare Other | Admitting: Urology

## 2017-07-04 ENCOUNTER — Encounter: Payer: Self-pay | Admitting: Urology

## 2017-07-04 VITALS — BP 111/77 | HR 106 | Ht 62.0 in

## 2017-07-04 DIAGNOSIS — N390 Urinary tract infection, site not specified: Secondary | ICD-10-CM | POA: Diagnosis not present

## 2017-07-04 LAB — URINALYSIS, COMPLETE
Bilirubin, UA: NEGATIVE
GLUCOSE, UA: NEGATIVE
Nitrite, UA: NEGATIVE
Specific Gravity, UA: 1.02 (ref 1.005–1.030)
UUROB: 2 mg/dL — AB (ref 0.2–1.0)
pH, UA: 5.5 (ref 5.0–7.5)

## 2017-07-04 LAB — MICROSCOPIC EXAMINATION

## 2017-07-04 MED ORDER — LIDOCAINE HCL 2 % EX GEL
1.0000 "application " | Freq: Once | CUTANEOUS | Status: AC
  Administered 2017-07-04: 1 via URETHRAL

## 2017-07-04 MED ORDER — CIPROFLOXACIN HCL 500 MG PO TABS
500.0000 mg | ORAL_TABLET | Freq: Once | ORAL | Status: AC
  Administered 2017-07-04: 500 mg via ORAL

## 2017-07-04 NOTE — Progress Notes (Signed)
   07/04/17  CC:  Chief Complaint  Patient presents with  . Cysto    HPI: Patient is a 71 year old Caucasian female who is referred to [place] by, Dr. Einar Crow, for recurrent urinary tract infections with her daughter,  Misty Stanley.  Patient states that she has had several urinary tract infections over the last year.  Reviewing her records, she has had greater than three documented UTI's over the last year.    Her symptoms with a urinary tract infection consist of dysuria, increased frequency and mental confusion.  She denies dysuria, gross hematuria, suprapubic pain, back pain, abdominal pain or flank pain at this time.  She has not had any recent fevers, chills, nausea or vomiting.   She is postmenopausal.  She does not have a history of breast cancer, stroke or blood clots.  She has incontinence.  She is using incontinence pads. 3 depends a day.   Contrast CT performed in 05/14/2017 noted chronic left adrenal gland thickening is stable since 2016. Negative right adrenal gland. Small chronic left renal cyst. Bilateral renal enhancement and contrast excretion is within normal limits. No hydronephrosis or hydroureter.  Diminutive and unremarkable urinary bladder.     At her last visit, she was started on vaginal estrogen cream for her vaginal atrophy on 05/23/2017.  She did have a breakthrough urinary tract infection (cathed specimen) in late November 2018.  She presents today for cystoscopy.    There were no vitals taken for this visit. NED. A&Ox3.   No respiratory distress   Abd soft, NT, ND Normal external genitalia with patent urethral meatus  Cystoscopy Procedure Note  Patient identification was confirmed, informed consent was obtained, and patient was prepped using Betadine solution.  Lidocaine jelly was administered per urethral meatus.    Preoperative abx where received prior to procedure.    Procedure: - Flexible cystoscope introduced, without any difficulty.   -  Thorough search of the bladder revealed:    normal urethral meatus    normal urothelium    no stones    no ulcers     no tumors    no urethral polyps    no trabeculation  - Ureteral orifices were normal in position and appearance.  Vaginal exam at the end the procedure reveals an estrogenized vagina.  Post-Procedure: - Patient tolerated the procedure well  Assessment/ Plan:  1. Recurrent UTI -The patient has had one breakthrough urinary tract infection in the last 6 weeks since starting her vaginal estrogen cream.  On vaginal exam today, her vagina was estrogenized appropriately from this medication.  For now we will continue just this medication.  She continues to have more breakthrough UTIs, we would need to consider adding a suppressive antibiotic.  Hildred Laser, MD

## 2017-07-08 DIAGNOSIS — Z8781 Personal history of (healed) traumatic fracture: Secondary | ICD-10-CM | POA: Insufficient documentation

## 2017-07-10 ENCOUNTER — Ambulatory Visit (INDEPENDENT_AMBULATORY_CARE_PROVIDER_SITE_OTHER): Payer: Medicare Other

## 2017-07-10 VITALS — BP 118/76 | HR 111

## 2017-07-10 DIAGNOSIS — N390 Urinary tract infection, site not specified: Secondary | ICD-10-CM

## 2017-07-10 NOTE — Progress Notes (Signed)
   In and Out Catheterization  Patient is present today for a I & O catheterization due to dysuria. Patient was cleaned and prepped in a sterile fashion with betadine and Lidocaine 2% jelly was instilled into the urethra.  A 16FR cath was inserted no complications were noted , of urine return was noted, urine was yellow in color. A clean urine sample was collected for UA/UCx. Bladder was drained  And catheter was removed with out difficulty.      Advised pt on u/a and ucx turn-around times. Informed pt  will contact when results are finalized and reviewed by provider. Pt verbalized understanding.      Preformed by: C. Rana Snare, CMA

## 2017-07-11 LAB — URINALYSIS, COMPLETE
Bilirubin, UA: NEGATIVE
Glucose, UA: NEGATIVE
Nitrite, UA: NEGATIVE
PH UA: 5.5 (ref 5.0–7.5)
RBC, UA: NEGATIVE
Specific Gravity, UA: 1.025 (ref 1.005–1.030)
Urobilinogen, Ur: 4 mg/dL — ABNORMAL HIGH (ref 0.2–1.0)

## 2017-07-11 LAB — MICROSCOPIC EXAMINATION

## 2017-07-14 ENCOUNTER — Telehealth: Payer: Self-pay

## 2017-07-14 ENCOUNTER — Ambulatory Visit
Admission: RE | Admit: 2017-07-14 | Discharge: 2017-07-14 | Disposition: A | Discharge status: Home / Self Care | Payer: Medicare Other | Source: Ambulatory Visit | Attending: Internal Medicine | Admitting: Internal Medicine

## 2017-07-14 DIAGNOSIS — Z1231 Encounter for screening mammogram for malignant neoplasm of breast: Secondary | ICD-10-CM | POA: Diagnosis present

## 2017-07-14 LAB — URINE CULTURE

## 2017-07-14 MED ORDER — NITROFURANTOIN MONOHYD MACRO 100 MG PO CAPS: 100.0000 mg | ORAL_CAPSULE | Freq: Two times a day (BID) | ORAL | 0 refills | Status: DC

## 2017-07-14 NOTE — Telephone Encounter (Signed)
Spoke with pt daughter in reference to +ucx and needing to stop cipro and start macrobid. Daughter stated that pt was never on cipro. Reinforced with daughter to start the macrobid tonight and pt should start feeling better soon. Daughter voiced understanding.

## 2017-07-14 NOTE — Telephone Encounter (Signed)
-----   Message from Harle Battiest, PA-C sent at 07/14/2017  2:32 PM EST ----- Please let Michelle Cabrera know that her urine culture was positive, but the bacteria was resistant to the Cipro.  She needs to stop the Cipro and start Macrobid 100 mg, bid x 7 days.

## 2017-07-15 ENCOUNTER — Telehealth: Payer: Self-pay

## 2017-07-15 NOTE — Telephone Encounter (Signed)
Daughter also stated that when pt is being treated for an infection she is not able to take her injections for RA. Then pt is constantly hurting from the RA.

## 2017-07-15 NOTE — Telephone Encounter (Signed)
Pt daughter called inquiring about pt being put on suppressive abx. Pt is currently being treated for another UTI with macrobid. Per daughter this is the second one in a couple of weeks. Reinforced with daughter hygiene post intercourse. Daughter voiced understanding. Please advise.

## 2017-07-18 ENCOUNTER — Other Ambulatory Visit: Payer: Self-pay | Admitting: Urology

## 2017-07-18 NOTE — Telephone Encounter (Signed)
Can you help with this?

## 2017-07-23 DIAGNOSIS — R634 Abnormal weight loss: Secondary | ICD-10-CM | POA: Insufficient documentation

## 2017-07-30 NOTE — Telephone Encounter (Signed)
Please advise 

## 2017-07-31 NOTE — Telephone Encounter (Signed)
Spoke with pt daughter, Misty Stanley, in reference to RA meds and daily abx. Misty Stanley stated that if pt has a current infection then RA meds cant be taken due to the infection NOT the abx. Misty Stanley was reluctant to make another OV when pt has one schedule for 08/15/17. Reinforced with Misty Stanley to keep 1/18 appt. Misty Stanley voiced understanding.

## 2017-08-14 NOTE — Progress Notes (Signed)
08/15/2017 11:20 AM   Erasmo Score 04-Feb-1946 [id number]  Referring provider: Kirk Ruths, MD [facility] [place], [place] [address]  Chief Complaint  Patient presents with  . Over Active Bladder    72month   HPI: 72yo WF with a history of recurrent urinary tract infections and vaginal atrophy who presents today for follow up.  Background history Patient is a 72year old Caucasian female who is referred to [facility], Dr. MFrazier Richards for recurrent urinary tract infections with her daughter,  LLattie Haw  Patient states that she has had several urinary tract infections over the last year.  Reviewing her records, she has had greater than three documented UTI's over the last year.  Her symptoms with a urinary tract infection consist of dysuria, increased frequency and mental confusion.  She denies dysuria, gross hematuria, suprapubic pain, back pain, abdominal pain or flank pain at this time.  She has not had any recent fevers, chills, nausea or vomiting.  Today, she complains of frequency, dysuria and nocturia.  Her PVR is 77 mL.  She states she is having no symptoms of an urinary tract infection today.  She does not have a history of nephrolithiasis, GU surgery or GU trauma.  She is postmenopausal.  She does not have a history of breast cancer, stroke or blood clots.  She denies constipation and/or diarrhea.  She does engage in good perineal hygiene. She does not take tub baths.  She has incontinence.  She is using incontinence pads. 3 depends a day.  She is not having pain with bladder filling.   Contrast CT performed in 05/14/2017 noted chronic left adrenal gland thickening is stable since 2016. Negative right adrenal gland. Small chronic left renal cyst. Bilateral renal enhancement and contrast excretion is within normal limits. No hydronephrosis or hydroureter.  Diminutive and unremarkable urinary bladder.   She is not drinking a lot of water daily.   "Sips here and there."  Cranberry juices.    She underwent cystoscopy with Dr. BPilar Jarvison 07/04/2017 and it was NED.    Today, she is complaining of a headache and lower back pain.  Her daughter states that her father told her that Mrs. OGeraciwas talking in her sleep and he is fearful that she may have a urinary tract infection.  The patient has been experiencing urgency x 0-3, frequency x 4, not restricting fluids to avoid visits to the restroom, is engaging in toilet mapping, incontinence x 0-3 and nocturia x 2.  She has not had gross hematuria, dysuria, or suprapubic pain.  She is not had any fevers, chills, nausea or vomiting.  CATH UA was negative microscopically.       PMH: Past Medical History:  Diagnosis Date  . Anxiety   . Arthritis   . Cataract   . Cervicalgia   . Chronic lower back pain   . Closed compression fracture of L1 lumbar vertebra ([place] 11/2012  . Collagen vascular disease ([place]   . COPD (chronic obstructive pulmonary disease) ([place]    unspecified  . DDD (degenerative disc disease)   . Depression   . Diabetes mellitus ([place]   . Disorder of bursae and tendons in shoulder region    unspecified  . Fall 09/27/2016   Fall Feb 04, 2016 with broken vertebra  . GERD (gastroesophageal reflux disease)   . Headache   . Hereditary and idiopathic peripheral neuropathy    unspecified  . History  of adenomatous polyp of colon    Followed by Dr. Tiffany Kocher  . History of tubal ligation   . Hypertension   . Intermediate coronary syndrome ([place])   . Lumbago   . Neuralgia and neuritis, unspecified    radiculitis unspecified  . Rheumatoid arthritis ([place])   . Rheumatoid arthritis ([place])   . Senile osteoporosis   . Spinal stenosis, lumbar region without neurogenic claudication     Surgical History: Past Surgical History:  Procedure Laterality Date  . BACK SURGERY    . BREAST BIOPSY Left    negative  . CATARACT EXTRACTION W/ INTRAOCULAR LENS  IMPLANT, BILATERAL Bilateral   .  COLONOSCOPY  08/17/2007   Adenomatous Polyps  . COLONOSCOPY  10/09/2010   PH Adenomatous polyps : [facility] 09/2015 ; Recall Ltr mailed 08/17/2015 (dw)  . ESOPHAGOGASTRODUODENOSCOPY  01/14/2008   No repeat per RTE  . FINGER ARTHROPLASTY Right 09/15/2014   Procedure: RIGHT MIDDLE FINGER, RING FINGER, SMALL FINGER EXTENSOR DIGITORUM COMMUMIS STABILIZATION WITH RIGHT MIDDLE FINGER MCP ARTHROPLASTY, POSSIBLE RING FINGER AND SMALL FINGER MCP ARTHROPLASTY;  Surgeon: Roseanne Kaufman, MD;  Location: [facility];  Service: Orthopedics;  Laterality: Right;  . FOOT SURGERY Bilateral   . HAND TENDON SURGERY Right 09/15/2014   "d/t RA"  . HARDWARE REMOVAL Right 04/16/2017   Procedure: HARDWARE REMOVAL;  Surgeon: Dereck Leep, MD;  Location: [facility];  Service: Orthopedics;  Laterality: Right;  . HUMERUS FRACTURE SURGERY Left   . INCISION AND DRAINAGE OF WOUND Right 04/16/2017   Procedure: IRRIGATION AND DEBRIDEMENT WOUND;  Surgeon: Wallace Going, DO;  Location: [facility];  Service: Plastics;  Laterality: Right;  . INTRAMEDULLARY (IM) NAIL INTERTROCHANTERIC Right 02/19/2017   Procedure: INTRAMEDULLARY (IM) NAIL INTERTROCHANTRIC;  Surgeon: Dereck Leep, MD;  Location: [facility];  Service: Orthopedics;  Laterality: Right;  . KYPHOPLASTY N/A 01/04/2013   Procedure: L1 Kyphoplasty;  Surgeon: Hosie Spangle, MD;  Location: [place] [facility];  Service: Neurosurgery;  Laterality: N/A;  L1 Kyphoplasty  . KYPHOPLASTY N/A 02/07/2014   Procedure: THORACIC SIX KYPHOPLASTY;  Surgeon: Hosie Spangle, MD;  Location: [facility];  Service: Neurosurgery;  Laterality: N/A;  T6 Kyphoplasty   . KYPHOPLASTY N/A 02/08/2016   Procedure: KYPHOPLASTY  T-12;  Surgeon: Hessie Knows, MD;  Location: [facility];  Service: Orthopedics;  Laterality: N/A;  . KYPHOPLASTY N/A 10/01/2016   Procedure: KYPHOPLASTY  L-5;  Surgeon: Hessie Knows, MD;  Location: [facility];  Service: Orthopedics;  Laterality: N/A;  . LUMBAR [place] SURGERY  2014    X 3  . LUMBAR  LAMINECTOMY    . ORIF ELBOW FRACTURE Right 02/19/2017   Procedure: OPEN REDUCTION INTERNAL FIXATION (ORIF) ELBOW/OLECRANON FRACTURE;  Surgeon: Dereck Leep, MD;  Location: [facility];  Service: Orthopedics;  Laterality: Right;  . PERIPHERAL VASCULAR CATHETERIZATION Left 12/07/2015   Procedure: Upper Extremity Angiography;  Surgeon: Algernon Huxley, MD;  Location: [place] [facility];  Service: Cardiovascular;  Laterality: Left;  . PERIPHERAL VASCULAR CATHETERIZATION  12/07/2015   Procedure: Upper Extremity Intervention;  Surgeon: Algernon Huxley, MD;  Location: [place] [facility];  Service: Cardiovascular;;  . POSTERIOR LUMBAR FUSION     "got screws in"  . TONSILLECTOMY    . TUBAL LIGATION    . WRIST FUSION Right    30 yrs ago    Home Medications:  Allergies as of 08/15/2017      Reactions   Ambien [zolpidem Tartrate] Other (See Comments)   Over  sedation    Dilaudid [hydromorphone Hcl] Other (See Comments)   Made tongue swell   Gold-containing Drug Products Anaphylaxis   Adhesive [tape] Other (See Comments)   Tears skin   Haldol [haloperidol] Other (See Comments)   Hallucinations      Medication List        Accurate as of 08/15/17 11:20 AM. Always use your most recent med list.          acetaminophen 500 MG tablet Commonly known as:  TYLENOL Take 1,000 mg by mouth daily as needed for mild pain or headache.   albuterol 108 (90 Base) MCG/ACT inhaler Commonly known as:  PROVENTIL HFA;VENTOLIN HFA Inhale 2 puffs into the lungs every 6 (six) hours as needed for wheezing or shortness of breath.   bisacodyl 10 MG suppository Commonly known as:  DULCOLAX Place 1 suppository (10 mg total) rectally daily as needed for moderate constipation.   BREO ELLIPTA 100-25 MCG/INH Aepb Generic drug:  fluticasone furoate-vilanterol Inhale 1 puff into the lungs every morning.   Calcium Carbonate-Vitamin D 600-400 MG-UNIT tablet Take 1 tablet by mouth daily.   cephALEXin 250 MG  capsule Commonly known as:  KEFLEX Take one capsule after intercourse   conjugated estrogens vaginal cream Commonly known as:  PREMARIN Apply 0.54m (pea-sized amount)  just inside the vaginal introitus with a finger-tip Monday, Wednesday and Friday nights.   DULoxetine 30 MG capsule Commonly known as:  CYMBALTA Take 30 mg by mouth at bedtime. *Note dose*   ENSURE ENLIVE PO Take 1 Bottle by mouth 2 (two) times daily between meals.   estradiol 0.1 MG/GM vaginal cream Commonly known as:  ESTRACE VAGINAL Apply 0.552m(pea-sized amount)  just inside the vaginal introitus with a finger-tip every night for two weeks and then Monday, Wednesday and Friday nights.   ferrous sulfate 325 (65 FE) MG tablet Take 1 tablet (325 mg total) by mouth 2 (two) times daily with a meal.   folic acid 1 MG tablet Commonly known as:  FOLVITE Take 1 mg by mouth daily.   gabapentin 300 MG capsule Commonly known as:  NEURONTIN Take 600 mg by mouth 2 (two) times daily as needed (restless legs).   HYDROcodone-acetaminophen 5-325 MG tablet Commonly known as:  NORCO Take 1 tablet by mouth every 6 (six) hours as needed for moderate pain.   megestrol 40 MG tablet Commonly known as:  MEGACE Take 1 tablet (40 mg total) by mouth daily.   METHOTREXATE SODIUM (PF) IJ Inject 0.8 mLs into the skin once a week. Methotrexate sodium solution (25 mg/ ml ) Give 0.8 ml on Friday.   metoprolol tartrate 25 MG tablet Commonly known as:  LOPRESSOR Take 25 mg by mouth 2 (two) times daily.   morphine 15 MG 12 hr tablet Commonly known as:  MS CONTIN Take 1 tablet (15 mg total) by mouth every 12 (twelve) hours.   OLANZapine 5 MG tablet Commonly known as:  ZYPREXA Take 1 tablet (5 mg total) by mouth at bedtime.   omeprazole 20 MG capsule Commonly known as:  PRILOSEC Take 20 mg by mouth 2 (two) times daily.   polyethylene glycol packet Commonly known as:  MIRALAX / GLYCOLAX Take 17 g by mouth daily as needed for mild  constipation.   predniSONE 10 MG tablet Commonly known as:  DELTASONE Take 2 tabs daily for 3 days; take one tab daily for 3 days; take 1/2 tab daily for 4 days   QUEtiapine 25 MG tablet Commonly  known as:  SEROQUEL Take 1 tablet (25 mg total) by mouth at bedtime.   senna 8.6 MG tablet Commonly known as:  SENOKOT Take 1 tablet by mouth daily as needed for constipation.   trolamine salicylate 10 % cream Commonly known as:  ASPERCREME Apply 1 application topically 2 (two) times daily as needed for muscle pain.   vitamin C 500 MG tablet Commonly known as:  ASCORBIC ACID Take 500 mg by mouth daily.       Allergies:  Allergies  Allergen Reactions  . Ambien [Zolpidem Tartrate] Other (See Comments)    Over sedation   . Dilaudid [Hydromorphone Hcl] Other (See Comments)    Made tongue swell  . Gold-Containing Drug Products Anaphylaxis  . Adhesive [Tape] Other (See Comments)    Tears skin  . Haldol [Haloperidol] Other (See Comments)    Hallucinations    Family History: Family History  Problem Relation Age of Onset  . CAD Father   . Alcohol abuse Father   . Heart disease Father   . CAD Mother   . Hypertension Mother   . Peripheral vascular disease Mother   . Osteoarthritis Mother   . Heart disease Mother   . Breast cancer Maternal Aunt 10  . Alcohol abuse Sister   . Arthritis Sister   . Cancer Sister   . Kidney cancer Neg Hx   . Kidney disease Neg Hx   . Prostate cancer Neg Hx     Social History:  reports that she quit smoking about 2 years ago. Her smoking use included cigarettes. She has a 56.00 pack-year smoking history. she has never used smokeless tobacco. She reports that she does not drink alcohol or use drugs.  ROS: UROLOGY Frequent Urination?: No Hard to postpone urination?: No Burning/pain with urination?: No Get up at night to urinate?: No Leakage of urine?: No Urine stream starts and stops?: No Trouble starting stream?: No Do you have to strain  to urinate?: No Blood in urine?: No Urinary tract infection?: Yes Sexually transmitted disease?: No Injury to kidneys or bladder?: No Painful intercourse?: No Weak stream?: No Currently pregnant?: No Vaginal bleeding?: No Last menstrual period?: n  Gastrointestinal Nausea?: No Vomiting?: No Indigestion/heartburn?: No Diarrhea?: No Constipation?: No  Constitutional Fever: No Night sweats?: No Weight loss?: No Fatigue?: No  Skin Skin rash/lesions?: No Itching?: No  Eyes Blurred vision?: No Double vision?: No  Ears/Nose/Throat Sore throat?: No Sinus problems?: No  Hematologic/Lymphatic Swollen glands?: No Easy bruising?: No  Cardiovascular Leg swelling?: No Chest pain?: No  Respiratory Cough?: No Shortness of breath?: No  Endocrine Excessive thirst?: No  Musculoskeletal Back pain?: No Joint pain?: No  Neurological Headaches?: No Dizziness?: No  Psychologic Depression?: No Anxiety?: No  Physical Exam: BP (!) 156/87   Pulse (!) 109   Ht _0  (1.575 m)   Wt 114 lb (51.7 kg)   BMI 20.85 kg/m   Constitutional: Well nourished. Alert and oriented, No acute distress. HEENT: [place] AT, moist mucus membranes. Trachea midline, no masses. Cardiovascular: No clubbing, cyanosis, or edema. Respiratory: Normal respiratory effort, no increased work of breathing. Skin: No rashes, bruises or suspicious lesions. Lymph: No cervical or inguinal adenopathy. Neurologic: Grossly intact, no focal deficits, moving all 4 extremities. Psychiatric: Normal mood and affect.  Laboratory Data: Lab Results  Component Value Date   WBC 11.8 (H) 05/14/2017   HGB 11.0 (L) 05/14/2017   HCT 33.1 (L) 05/14/2017   MCV 87.4 05/14/2017   PLT 715 (H)  05/14/2017    Lab Results  Component Value Date   CREATININE 0.48 05/16/2017    Lab Results  Component Value Date   HGBA1C 6.5 (H) 02/20/2017    Lab Results  Component Value Date   AST 31 05/09/2017   Lab Results    Component Value Date   ALT 11 (L) 05/09/2017   I have reviewed the labs.   Pertinent Imaging: CLINICAL DATA:  72 year old female with recent UTI. Confusion, subsequent delirium, nausea, vomiting.  EXAM: CT CHEST, ABDOMEN, AND PELVIS WITH CONTRAST  TECHNIQUE: Multidetector CT imaging of the chest, abdomen and pelvis was performed following the standard protocol during bolus administration of intravenous contrast.  CONTRAST:  152m ISOVUE-300 IOPAMIDOL (ISOVUE-300) INJECTION 61%  COMPARISON:  Chest radiographs 10/28/2016. Thoracic spine CT 02/04/2016. Lumbar MRI 07/13/2015. Chest CTA 02/26/2015.  FINDINGS: CT CHEST FINDINGS  Cardiovascular: Chronic Calcified aortic atherosclerosis. Calcified coronary artery atherosclerosis. Mild cardiomegaly. No pericardial effusion. Major mediastinal vascular structures that central mediastinal vascular structures appear patent.  Mediastinum/Nodes: Negative.  No mediastinal lymphadenopathy.  Lungs/Pleura: Major airways are patent. Right greater than left dependent pulmonary atelectasis. Centrilobular emphysema. Posterior basal segment lower lobe bronchiectasis (series 5, image 90). No pleural effusion or other abnormal pulmonary opacity.  Musculoskeletal: Osteopenia. Chronic thoracic compression fractures of T5, T6 and T12. Augmentation of the T12 vertebral body since July 2017 with chronic retropulsion of bone.  New since 02/04/2016 severe compression fractures of T8 and T10. The T10 level was mildly compressed on 10/28/2016. The T8 compression appears new since 10/28/2016, but is similarly sclerotic to the T10 level.  Proximal right humerus deformity appears chronic. Similar nonacute appearing intermittent left rib fractures.  CT ABDOMEN PELVIS FINDINGS  Hepatobiliary: Negative liver and gallbladder.  Pancreas: Negative.  Spleen: Negative.  Adrenals/Urinary Tract: Chronic left adrenal gland thickening  is stable since 2016. Negative right adrenal gland. Small chronic left renal cyst. Bilateral renal enhancement and contrast excretion is within normal limits. No hydronephrosis or hydroureter.  Diminutive and unremarkable urinary bladder.  Stomach/Bowel: Decompressed rectosigmoid colon. Minimal sigmoid diverticular without active inflammation. Decompressed left colon. Oral contrast has reached the splenic flexure. Redundant transverse colon intermittently distended with gas and oral contrast. Abrupt caliber change in the distal transverse segment on coronal image 28 is felt to reflect bowel peristalsis. Interposed hepatic flexure between the liver and anterior diaphragm. The right colon is felt with oral contrast. Normal retrocecal appendix. Negative terminal ileum. No dilated small bowel. Negative stomach and duodenum.  No abdominal free fluid or free air.  Vascular/Lymphatic: Extensive Aortoiliac calcified atherosclerosis. Ectatic infrarenal abdominal aorta. Major arterial structures in the abdomen and pelvis appear patent.  Portal venous system appears patent.  No lymphadenopathy.  Reproductive: Negative.  Other: No pelvic free fluid. Trace right ventral abdominal wall subcutaneous gas (series 3, image 71) likely related to recent subcutaneous injection.  Musculoskeletal: Osteopenia. Chronic left pubic rami fractures. Proximal right femur ORIF. No acute osseous abnormality identified. Chronic L1 and L5 compression fractures previously augmented. Chronic postoperative changes to the L2-L3 and L3-L4 levels.  IMPRESSION: 1. No acute findings in the chest aside from dependent atelectasis. 2. Aortic Atherosclerosis (ICD10-I70.0) and Emphysema (ICD10-J43.9), and lower lobe Bronchiectasis. 3. No acute or inflammatory process identified in the abdomen or pelvis. No bowel obstruction. 4. Chronic vertebral compression fractures with progression in the thoracic spine  since April 2018. 5. Chronic proximal right humerus fracture and left rib fractures. Chronic postoperative changes to the lumbar spine.   Electronically Signed   By:  Genevie Ann M.D.   On: 05/14/2017 12:06    Assessment & Plan:    1. Recurrent UTI's  - criteria for recurrent UTI has been met with 2 or more infections in 6 months or 3 or greater infections in one year   - Patient is again instructed to increase their water intake until the urine is pale yellow or clear (10 to 12 cups daily) - her daughter will be staying with her in the future and will make sure she will drink more water  - probiotics (yogurt, oral pills or vaginal suppositories), take cranberry pills or drink the juice and Vitamin C 1,000 mg daily to acidify the urine should be added to their daily regimen -her daughter will be staying with her and will see that she is taking the supplements  - avoid soaking in tubs and wipe front to back after urinating   - advised them to have CATH UA's for urinalysis and culture to prevent skin contamination of the specimen  - reviewed symptoms of UTI and advised not to have urine checked or be treated for UTI if not experiencing symptoms  -Will initiate a suppressive antibiotic at this time if her urine culture returns negative, her daughter states that she is unable to receive her rheumatoid arthritis medication while she has an active infection and is concerned for her mother missing several doses of the rheumatoid arthritis treatment due to her recurrent urinary tract infections, the trimethoprim interferes with her rheumatoid arthritis medication, so we will initiate her on Keflex 250 mg 1 daily and she will return in 3 months for reassessment  -She and her daughter are instructed to contact our office for any signs of a breakthrough infection  2. Vaginal atrophy  -Patient will continue the vaginal estrogen cream 3 nights weekly  - She will follow up in three months for an exam.                                           Return in about 3 months (around 11/13/2017) for PVR and OAB questionnaire.  These notes generated with voice recognition software. I apologize for typographical errors.  Zara Council, [facility] [address] [place], [place] [address] [phone]

## 2017-08-15 ENCOUNTER — Other Ambulatory Visit
Admission: RE | Admit: 2017-08-15 | Discharge: 2017-08-15 | Disposition: A | Discharge status: Home / Self Care | Payer: Medicare Other | Source: Ambulatory Visit | Attending: Urology | Admitting: Urology

## 2017-08-15 ENCOUNTER — Encounter: Payer: Self-pay | Admitting: Urology

## 2017-08-15 ENCOUNTER — Ambulatory Visit (INDEPENDENT_AMBULATORY_CARE_PROVIDER_SITE_OTHER): Payer: Medicare Other | Admitting: Urology

## 2017-08-15 VITALS — BP 156/87 | HR 109 | Ht 62.0 in | Wt 114.0 lb

## 2017-08-15 DIAGNOSIS — N390 Urinary tract infection, site not specified: Secondary | ICD-10-CM | POA: Diagnosis not present

## 2017-08-15 DIAGNOSIS — N952 Postmenopausal atrophic vaginitis: Secondary | ICD-10-CM

## 2017-08-15 LAB — URINALYSIS, COMPLETE (UACMP) WITH MICROSCOPIC
Bacteria, UA: NONE SEEN
Glucose, UA: NEGATIVE mg/dL
HGB URINE DIPSTICK: NEGATIVE
Ketones, ur: 15 mg/dL — AB
LEUKOCYTES UA: NEGATIVE
Nitrite: NEGATIVE
PH: 5.5 (ref 5.0–8.0)
Protein, ur: 30 mg/dL — AB
RBC / HPF: NONE SEEN RBC/hpf (ref 0–5)
SPECIFIC GRAVITY, URINE: 1.025 (ref 1.005–1.030)

## 2017-08-15 MED ORDER — CEPHALEXIN 250 MG PO CAPS: ORAL_CAPSULE | ORAL | 0 refills | Status: DC

## 2017-08-15 NOTE — Progress Notes (Signed)
In and Out Catheterization  Patient is present today for a I & O catheterization due to recurrent UTI. Patient was cleaned and prepped in a sterile fashion with betadine and Lidocaine 2% jelly was instilled into the urethra.  A 14FR cath was inserted no complications were noted , 55ml of urine return was noted, urine was dark orange in color. A clean urine sample was collected for UA and culture. Bladder was drained  And catheter was removed with out difficulty.    Preformed by: Maralyn Sago watts, CMA

## 2017-08-17 LAB — URINE CULTURE: Culture: NO GROWTH

## 2017-09-08 ENCOUNTER — Other Ambulatory Visit: Payer: Self-pay | Admitting: Nurse Practitioner

## 2017-09-08 DIAGNOSIS — R251 Tremor, unspecified: Secondary | ICD-10-CM

## 2017-09-15 ENCOUNTER — Ambulatory Visit
Admission: RE | Admit: 2017-09-15 | Discharge: 2017-09-15 | Disposition: A | Discharge status: Home / Self Care | Payer: Medicare Other | Source: Ambulatory Visit | Attending: Nurse Practitioner | Admitting: Nurse Practitioner

## 2017-09-15 DIAGNOSIS — G319 Degenerative disease of nervous system, unspecified: Secondary | ICD-10-CM | POA: Insufficient documentation

## 2017-09-15 DIAGNOSIS — R2689 Other abnormalities of gait and mobility: Secondary | ICD-10-CM | POA: Diagnosis not present

## 2017-09-15 DIAGNOSIS — R42 Dizziness and giddiness: Secondary | ICD-10-CM | POA: Diagnosis not present

## 2017-09-15 DIAGNOSIS — I6782 Cerebral ischemia: Secondary | ICD-10-CM | POA: Insufficient documentation

## 2017-09-15 DIAGNOSIS — R251 Tremor, unspecified: Secondary | ICD-10-CM | POA: Diagnosis present

## 2017-09-25 ENCOUNTER — Other Ambulatory Visit: Payer: Self-pay | Admitting: Internal Medicine

## 2017-09-25 DIAGNOSIS — M7989 Other specified soft tissue disorders: Secondary | ICD-10-CM

## 2017-09-26 ENCOUNTER — Ambulatory Visit
Admission: RE | Admit: 2017-09-26 | Discharge: 2017-09-26 | Disposition: A | Discharge status: Home / Self Care | Payer: Medicare Other | Source: Ambulatory Visit | Attending: Internal Medicine | Admitting: Internal Medicine

## 2017-09-26 DIAGNOSIS — M7989 Other specified soft tissue disorders: Secondary | ICD-10-CM

## 2017-11-06 ENCOUNTER — Emergency Department
Admission: EM | Admit: 2017-11-06 | Discharge: 2017-11-06 | Disposition: A | Discharge status: Home / Self Care | Payer: Medicare Other | Attending: Emergency Medicine | Admitting: Emergency Medicine

## 2017-11-06 ENCOUNTER — Emergency Department: Payer: Medicare Other

## 2017-11-06 ENCOUNTER — Other Ambulatory Visit: Payer: Self-pay

## 2017-11-06 DIAGNOSIS — Z79899 Other long term (current) drug therapy: Secondary | ICD-10-CM | POA: Diagnosis not present

## 2017-11-06 DIAGNOSIS — E876 Hypokalemia: Secondary | ICD-10-CM

## 2017-11-06 DIAGNOSIS — E119 Type 2 diabetes mellitus without complications: Secondary | ICD-10-CM | POA: Diagnosis not present

## 2017-11-06 DIAGNOSIS — R531 Weakness: Secondary | ICD-10-CM

## 2017-11-06 DIAGNOSIS — Y92009 Unspecified place in unspecified non-institutional (private) residence as the place of occurrence of the external cause: Secondary | ICD-10-CM | POA: Diagnosis not present

## 2017-11-06 DIAGNOSIS — Y999 Unspecified external cause status: Secondary | ICD-10-CM | POA: Diagnosis not present

## 2017-11-06 DIAGNOSIS — I10 Essential (primary) hypertension: Secondary | ICD-10-CM | POA: Insufficient documentation

## 2017-11-06 DIAGNOSIS — J449 Chronic obstructive pulmonary disease, unspecified: Secondary | ICD-10-CM | POA: Diagnosis not present

## 2017-11-06 DIAGNOSIS — M25551 Pain in right hip: Secondary | ICD-10-CM | POA: Diagnosis present

## 2017-11-06 DIAGNOSIS — W1839XA Other fall on same level, initial encounter: Secondary | ICD-10-CM | POA: Insufficient documentation

## 2017-11-06 DIAGNOSIS — Z87891 Personal history of nicotine dependence: Secondary | ICD-10-CM | POA: Diagnosis not present

## 2017-11-06 DIAGNOSIS — Z23 Encounter for immunization: Secondary | ICD-10-CM | POA: Diagnosis not present

## 2017-11-06 DIAGNOSIS — Y939 Activity, unspecified: Secondary | ICD-10-CM | POA: Insufficient documentation

## 2017-11-06 DIAGNOSIS — S79911A Unspecified injury of right hip, initial encounter: Secondary | ICD-10-CM | POA: Diagnosis not present

## 2017-11-06 DIAGNOSIS — Z96641 Presence of right artificial hip joint: Secondary | ICD-10-CM | POA: Diagnosis not present

## 2017-11-06 DIAGNOSIS — W19XXXA Unspecified fall, initial encounter: Secondary | ICD-10-CM

## 2017-11-06 LAB — BASIC METABOLIC PANEL
ANION GAP: 7 (ref 5–15)
BUN: 19 mg/dL (ref 6–20)
CALCIUM: 8.5 mg/dL — AB (ref 8.9–10.3)
CO2: 30 mmol/L (ref 22–32)
Chloride: 101 mmol/L (ref 101–111)
Creatinine, Ser: 0.45 mg/dL (ref 0.44–1.00)
GLUCOSE: 152 mg/dL — AB (ref 65–99)
Potassium: 2.8 mmol/L — ABNORMAL LOW (ref 3.5–5.1)
SODIUM: 138 mmol/L (ref 135–145)

## 2017-11-06 LAB — CBC WITH DIFFERENTIAL/PLATELET
BASOS ABS: 0.1 10*3/uL (ref 0–0.1)
BASOS PCT: 1 %
Eosinophils Absolute: 0.1 10*3/uL (ref 0–0.7)
Eosinophils Relative: 0 %
HEMATOCRIT: 37.8 % (ref 35.0–47.0)
Hemoglobin: 12.6 g/dL (ref 12.0–16.0)
Lymphocytes Relative: 14 %
Lymphs Abs: 2.1 10*3/uL (ref 1.0–3.6)
MCH: 29.9 pg (ref 26.0–34.0)
MCHC: 33.3 g/dL (ref 32.0–36.0)
MCV: 89.6 fL (ref 80.0–100.0)
MONO ABS: 1 10*3/uL — AB (ref 0.2–0.9)
Monocytes Relative: 7 %
Neutro Abs: 12.1 10*3/uL — ABNORMAL HIGH (ref 1.4–6.5)
Neutrophils Relative %: 78 %
PLATELETS: 490 10*3/uL — AB (ref 150–440)
RBC: 4.21 MIL/uL (ref 3.80–5.20)
RDW: 18.2 % — AB (ref 11.5–14.5)
WBC: 15.4 10*3/uL — AB (ref 3.6–11.0)

## 2017-11-06 LAB — URINALYSIS, COMPLETE (UACMP) WITH MICROSCOPIC
BACTERIA UA: NONE SEEN
Bilirubin Urine: NEGATIVE
GLUCOSE, UA: 50 mg/dL — AB
Hgb urine dipstick: NEGATIVE
KETONES UR: NEGATIVE mg/dL
Leukocytes, UA: NEGATIVE
Nitrite: NEGATIVE
PROTEIN: 30 mg/dL — AB
Specific Gravity, Urine: 1.014 (ref 1.005–1.030)
pH: 6 (ref 5.0–8.0)

## 2017-11-06 MED ORDER — TETANUS-DIPHTH-ACELL PERTUSSIS 5-2.5-18.5 LF-MCG/0.5 IM SUSP
0.5000 mL | Freq: Once | INTRAMUSCULAR | Status: AC
  Administered 2017-11-06: 0.5 mL via INTRAMUSCULAR
  Filled 2017-11-06: qty 0.5

## 2017-11-06 MED ORDER — BACITRACIN ZINC 500 UNIT/GM EX OINT
TOPICAL_OINTMENT | Freq: Once | CUTANEOUS | Status: AC
  Administered 2017-11-06: 18:00:00 via TOPICAL

## 2017-11-06 MED ORDER — BACITRACIN ZINC 500 UNIT/GM EX OINT
TOPICAL_OINTMENT | CUTANEOUS | Status: AC
  Filled 2017-11-06: qty 2.7

## 2017-11-06 MED ORDER — OXYCODONE-ACETAMINOPHEN 5-325 MG PO TABS
1.0000 | ORAL_TABLET | Freq: Once | ORAL | Status: AC
  Administered 2017-11-06: 1 via ORAL
  Filled 2017-11-06: qty 1

## 2017-11-06 MED ORDER — POTASSIUM CHLORIDE CRYS ER 20 MEQ PO TBCR
40.0000 meq | EXTENDED_RELEASE_TABLET | Freq: Once | ORAL | Status: AC
  Administered 2017-11-06: 40 meq via ORAL
  Filled 2017-11-06: qty 2

## 2017-11-06 MED ORDER — SODIUM CHLORIDE 0.9 % IV BOLUS
500.0000 mL | Freq: Once | INTRAVENOUS | Status: AC
  Administered 2017-11-06: 500 mL via INTRAVENOUS

## 2017-11-06 MED ORDER — POTASSIUM CHLORIDE ER 20 MEQ PO TBCR: 20.0000 meq | EXTENDED_RELEASE_TABLET | Freq: Every day | ORAL | 0 refills | Status: DC

## 2017-11-06 MED ORDER — FENTANYL CITRATE (PF) 100 MCG/2ML IJ SOLN
50.0000 ug | Freq: Once | INTRAMUSCULAR | Status: AC
  Administered 2017-11-06: 50 ug via INTRAVENOUS
  Filled 2017-11-06: qty 2

## 2017-11-06 NOTE — ED Provider Notes (Addendum)
[facility] Emergency Department Provider Note  ____________________________________________   I have reviewed the triage vital signs and the nursing notes. Where available I have reviewed prior notes and, if possible and indicated, outside hospital notes.    HISTORY  Chief Complaint Fall and Leg Injury    HPI Michelle Cabrera is a 72 y.o. female C with a history of multiple different medical problems including falls, hip replacement in the past, chronic pain, COPD, was in her normal state of health, she turned, her foot by consulting and she fell, she is insisting that she did not hit her head or pass out.  She has pain in her right hip.  She also complains of skin tears to lateral lower extremities on the right elbow but no bony tenderness in any of those areas. Patient was able to bear weight apparently after her fall   Past Medical History:  Diagnosis Date  . Anxiety   . Arthritis   . Cataract   . Cervicalgia   . Chronic lower back pain   . Closed compression fracture of L1 lumbar vertebra 11/2012  . Collagen vascular disease (HCC)   . COPD (chronic obstructive pulmonary disease) (HCC)    unspecified  . DDD (degenerative disc disease)   . Depression   . Diabetes mellitus (HCC)   . Disorder of bursae and tendons in shoulder region    unspecified  . Fall 09/27/2016   Fall Feb 04, 2016 with broken vertebra  . GERD (gastroesophageal reflux disease)   . Headache   . Hereditary and idiopathic peripheral neuropathy    unspecified  . History of adenomatous polyp of colon    Followed by Dr. Markham Jordan  . History of tubal ligation   . Hypertension   . Intermediate coronary syndrome (HCC)   . Lumbago   . Neuralgia and neuritis, unspecified    radiculitis unspecified  . Rheumatoid arthritis (HCC)   . Rheumatoid arthritis (HCC)   . Senile osteoporosis   . Spinal stenosis, lumbar region without neurogenic claudication     Patient Active Problem List    Diagnosis Date Noted  . Subacute delirium 05/13/2017  . Chronic prescription opiate use 05/13/2017  . Constipation 05/13/2017  . UTI (urinary tract infection) 05/09/2017  . Tachycardia 03/13/2017  . Protein calorie malnutrition (HCC) 03/13/2017  . Closed fracture of right olecranon process with routine healing 03/13/2017  . Hip fracture (HCC) 02/18/2017  . Bilateral carotid artery stenosis 12/10/2016  . Subclavian arterial stenosis (HCC) 12/10/2016  . Thoracic compression fracture (HCC) 02/04/2016  . UTI (lower urinary tract infection) 02/04/2016  . Schizophrenia spectrum disorder with psychotic disorder type not yet determined (HCC) 11/13/2015  . Syncope 11/10/2015  . Hypotension 11/10/2015  . 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 Pressure ulcer 09/08/2015  . New onset type 2 diabetes mellitus (HCC) 09/06/2015  . Altered mental status 09/06/2015  . COPD exacerbation (HCC) 07/26/2015  . Arthritis associated with another disorder 09/15/2014  . Wedge compression fracture of T6 vertebra (HCC) 02/07/2014  . Intractable low back pain 12/27/2012  . DDD (degenerative disc disease) 12/27/2012  . Rheumatoid arthritis (HCC) 12/27/2012  . Hypertension 12/27/2012    Past Surgical History:  Procedure Laterality Date  . BACK SURGERY    . BREAST BIOPSY Left    negative  . CATARACT EXTRACTION W/ INTRAOCULAR LENS  IMPLANT, BILATERAL Bilateral   . COLONOSCOPY  08/17/2007   Adenomatous Polyps  . COLONOSCOPY  10/09/2010   PH Adenomatous polyps :  [facility] 09/2015 ; Recall Ltr mailed 08/17/2015 (dw)  . ESOPHAGOGASTRODUODENOSCOPY  01/14/2008   No repeat per RTE  . FINGER ARTHROPLASTY Right 09/15/2014   Procedure: RIGHT MIDDLE FINGER, RING FINGER, SMALL FINGER EXTENSOR DIGITORUM COMMUMIS STABILIZATION WITH RIGHT MIDDLE FINGER MCP ARTHROPLASTY, POSSIBLE RING FINGER AND SMALL FINGER MCP ARTHROPLASTY;  Surgeon: Dominica Severin, MD;  Location: [facility] OR;  Service: Orthopedics;  Laterality: Right;  . FOOT SURGERY  Bilateral   . HAND TENDON SURGERY Right 09/15/2014   "d/t RA"  . HARDWARE REMOVAL Right 04/16/2017   Procedure: HARDWARE REMOVAL;  Surgeon: Donato Heinz, MD;  Location: [facility] ORS;  Service: Orthopedics;  Laterality: Right;  . HUMERUS FRACTURE SURGERY Left   . INCISION AND DRAINAGE OF WOUND Right 04/16/2017   Procedure: IRRIGATION AND DEBRIDEMENT WOUND;  Surgeon: Peggye Form, DO;  Location: [facility] ORS;  Service: Plastics;  Laterality: Right;  . INTRAMEDULLARY (IM) NAIL INTERTROCHANTERIC Right 02/19/2017   Procedure: INTRAMEDULLARY (IM) NAIL INTERTROCHANTRIC;  Surgeon: Donato Heinz, MD;  Location: [facility] ORS;  Service: Orthopedics;  Laterality: Right;  . KYPHOPLASTY N/A 01/04/2013   Procedure: L1 Kyphoplasty;  Surgeon: Hewitt Shorts, MD;  Location: [facility];  Service: Neurosurgery;  Laterality: N/A;  L1 Kyphoplasty  . KYPHOPLASTY N/A 02/07/2014   Procedure: THORACIC SIX KYPHOPLASTY;  Surgeon: Hewitt Shorts, MD;  Location: [facility];  Service: Neurosurgery;  Laterality: N/A;  T6 Kyphoplasty   . KYPHOPLASTY N/A 02/08/2016   Procedure: KYPHOPLASTY  T-12;  Surgeon: Kennedy Bucker, MD;  Location: [facility] OR[facility];  Service: Orthopedics;  Laterality: N/A;  . KYPHOPLASTY N/A 10/01/2016   Procedure: KYPHOPLASTY  L-5;  Surgeon: Kennedy Bucker, MD;  Location: [facility];  Service: Orthopedics;  Laterality: N/A;  . LUMBAR DISC SURGERY  2014    X 3  . LUMBAR LAMINECTOMY    . ORIF ELBOW FRACTURE Right 02/19/2017   Procedure: OPEN REDUCTION INTERNAL FIXATION (ORIF) ELBOW/OLECRANON FRACTURE;  Surgeon: Donato Heinz, MD;  Location: [facility];  Service: Orthopedics;  Laterality: Right;  . PERIPHERAL VASCULAR CATHETERIZATION Left 12/07/2015   Procedure: Upper Extremity Angiography;  Surgeon: Annice Needy, MD;  Location: [facility];  Service: Cardiovascular;  Laterality: Left;  . PERIPHERAL VASCULAR CATHETERIZATION  12/07/2015   Procedure: Upper Extremity Intervention;  Surgeon: Annice Needy, MD;   Location: [facility];  Service: Cardiovascular;;  . POSTERIOR LUMBAR FUSION     "got screws in"  . TONSILLECTOMY    . TUBAL LIGATION    . WRIST FUSION Right    30 yrs ago    Prior to Admission medications   Medication Sig Start Date End Date Taking? Authorizing Provider  acetaminophen (TYLENOL) 500 MG tablet Take 1,000 mg by mouth daily as needed for mild pain or headache.     [provider]  albuterol (PROVENTIL HFA;VENTOLIN HFA) 108 (90 Base) MCG/ACT inhaler Inhale 2 puffs into the lungs every 6 (six) hours as needed for wheezing or shortness of breath.    [provider]  bisacodyl (DULCOLAX) 10 MG suppository Place 1 suppository (10 mg total) rectally daily as needed for moderate constipation. 02/21/17   Altamese Dilling, MD  Calcium Carbonate-Vitamin D 600-400 MG-UNIT tablet Take 1 tablet by mouth daily.     [provider]  cephALEXin (KEFLEX) 250 MG capsule Take one capsule after intercourse 08/15/17   Michiel Cowboy A, PA-C  conjugated estrogens (PREMARIN) vaginal cream Apply 0.5mg  (pea-sized amount)  just inside the vaginal introitus with  a finger-tip Monday, Wednesday and Friday nights. 05/23/17   Michiel Cowboy A, PA-C  DULoxetine (CYMBALTA) 30 MG capsule Take 30 mg by mouth at bedtime. *Note dose*    [provider]  estradiol (ESTRACE VAGINAL) 0.1 MG/GM vaginal cream Apply 0.5mg  (pea-sized amount)  just inside the vaginal introitus with a finger-tip every night for two weeks and then Monday, Wednesday and Friday nights. 05/23/17   Michiel Cowboy A, PA-C  ferrous sulfate 325 (65 FE) MG tablet Take 1 tablet (325 mg total) by mouth 2 (two) times daily with a meal. 02/21/17   Altamese Dilling, MD  Fluticasone Furoate-Vilanterol (BREO ELLIPTA) 100-25 MCG/INH AEPB Inhale 1 puff into the lungs every morning.    [provider]  folic acid (FOLVITE) 1 MG tablet Take 1 mg by mouth daily.    [provider]   gabapentin (NEURONTIN) 300 MG capsule Take 600 mg by mouth 2 (two) times daily as needed (restless legs).     [provider]  HYDROcodone-acetaminophen (NORCO) 5-325 MG tablet Take 1 tablet by mouth every 6 (six) hours as needed for moderate pain. 05/16/17   Alford Highland, MD  megestrol (MEGACE) 40 MG tablet Take 1 tablet (40 mg total) by mouth daily. 05/16/17   Wieting, Richard, MD  METHOTREXATE SODIUM, PF, IJ Inject 0.8 mLs into the skin once a week. Methotrexate sodium solution (25 mg/ ml ) Give 0.8 ml on Friday.    [provider]  metoprolol tartrate (LOPRESSOR) 25 MG tablet Take 25 mg by mouth 2 (two) times daily.    [provider]  morphine (MS CONTIN) 15 MG 12 hr tablet Take 1 tablet (15 mg total) by mouth every 12 (twelve) hours. 05/16/17   Alford Highland, MD  Nutritional Supplements (ENSURE ENLIVE PO) Take 1 Bottle by mouth 2 (two) times daily between meals.    [provider]  OLANZapine (ZYPREXA) 5 MG tablet Take 1 tablet (5 mg total) by mouth at bedtime. 05/16/17   Alford Highland, MD  omeprazole (PRILOSEC) 20 MG capsule Take 20 mg by mouth 2 (two) times daily.     [provider]  polyethylene glycol (MIRALAX / GLYCOLAX) packet Take 17 g by mouth daily as needed for mild constipation.     [provider]  predniSONE (DELTASONE) 10 MG tablet Take 2 tabs daily for 3 days; take one tab daily for 3 days; take 1/2 tab daily for 4 days 05/16/17   Alford Highland, MD  QUEtiapine (SEROQUEL) 25 MG tablet Take 1 tablet (25 mg total) by mouth at bedtime. 05/16/17   Alford Highland, MD  senna (SENOKOT) 8.6 MG tablet Take 1 tablet by mouth daily as needed for constipation.     [provider]  trolamine salicylate (ASPERCREME) 10 % cream Apply 1 application topically 2 (two) times daily as needed for muscle pain.    [provider]  vitamin C (ASCORBIC ACID) 500 MG tablet Take 500 mg by mouth daily.    [provider]    Allergies Ambien [zolpidem tartrate]; Dilaudid [hydromorphone hcl]; Gold-containing drug products; Adhesive [tape]; and Haldol [haloperidol]  Family History  Problem Relation Age of Onset  . CAD Father   . Alcohol abuse Father   . Heart disease Father   . CAD Mother   . Hypertension Mother   . Peripheral vascular disease Mother   . Osteoarthritis Mother   . Heart disease Mother   . Breast cancer Maternal Aunt 50  . Alcohol abuse  Sister   . Arthritis Sister   . Cancer Sister   . Kidney cancer Neg Hx   . Kidney disease Neg Hx   . Prostate cancer Neg Hx     Social History Social History   Tobacco Use  . Smoking status: Former Smoker    Packs/day: 1.00    Years: 56.00    Pack years: 56.00    Types: Cigarettes    Last attempt to quit: 07/25/2015    Years since quitting: 2.2  . Smokeless tobacco: Never Used  Substance Use Topics  . Alcohol use: No    Alcohol/week: 0.0 oz  . Drug use: No    Review of Systems Constitutional: No fever/chills Eyes: No visual changes. ENT: No sore throat. No stiff neck no neck pain Cardiovascular: Denies chest pain. Respiratory: Denies shortness of breath. Gastrointestinal:   no vomiting.  No diarrhea.  No constipation. Genitourinary: Negative for dysuria. Musculoskeletal: Negative lower extremity swelling Skin: Negative for rash. Neurological: Negative for severe headaches, focal weakness or numbness.   ____________________________________________   PHYSICAL EXAM:  VITAL SIGNS: ED Triage Vitals  Enc Vitals Group     BP 11/06/17 1706 (!) 149/90     Pulse Rate 11/06/17 1706 (!) 103     Resp 11/06/17 1706 20     Temp 11/06/17 1706 97.9 F (36.6 C)     Temp Source 11/06/17 1706 Oral     SpO2 11/06/17 1706 98 %     Weight 11/06/17 1706 120 lb (54.4 kg)     Height --      Head Circumference --      Peak Flow --      Pain Score 11/06/17 1705 8     Pain Loc --      Pain Edu? --      Excl. in GC? --      Constitutional: Alert and oriented.  Real elderly woman in no acute distress eyes: Conjunctivae are normal Head: Atraumatic HEENT: No congestion/rhinnorhea. Mucous membranes are moist.  Oropharynx non-erythematous Neck:   Nontender with no meningismus, no masses, no stridor Cardiovascular: Normal rate, regular rhythm. Grossly normal heart sounds.  Good peripheral circulation. Respiratory: Normal respiratory effort.  No retractions. Lungs CTAB. Abdominal: Soft and nontender. No distention. No guarding no rebound Back:  There is no focal tenderness or step off.  there is no midline tenderness there are no lesions noted. there is no CVA tenderness Musculoskeletal: She has some tenderness palpation the right hip I can range it however., no upper extremity tenderness. No joint effusions, no DVT signs strong distal pulses no edema Neurologic:  Normal speech and language. No gross focal neurologic deficits are appreciated.  Skin:  Skin is warm, dry and intact.  Anterior is noted to right elbow, left anterior tibial region and right posterior tibial region. Psychiatric: Mood and affect are normal. Speech and behavior are normal.  ____________________________________________   LABS (all labs ordered are listed, but only abnormal results are displayed)  Labs Reviewed - No data to display  Pertinent labs  results that were available during my care of the patient were reviewed by me and considered in my medical decision making (see chart for details). ____________________________________________  EKG  I personally interpreted any EKGs ordered by me or triage  ____________________________________________  RADIOLOGY  Pertinent labs & imaging results that were available during my care of the patient were reviewed by me and considered in my medical decision making (see chart for details). If possible,  patient and/or family made aware of any abnormal findings.  No results  found. ____________________________________________    PROCEDURES  Procedure(s) performed: None  Procedures  Critical Care performed: None  ____________________________________________   INITIAL IMPRESSION / ASSESSMENT AND PLAN / ED COURSE  Pertinent labs & imaging results that were available during my care of the patient were reviewed by me and considered in my medical decision making (see chart for details).  Here after non-syncopal fall, multiple skin tears noted, not clear if her tetanus shot is up-to-date but she thinks that it is we will update that, patient has nothing I can so unfortunately we will give appropriate wound care for all of them including irrigation bacitracin and Steri-Strips to the extent possible.  Only bony tenderness that I proceed on this patient is in the right hip, will obtain a x-ray of that area although she does have a hip replacement, in that area she may have suffered an acetabular or pelvic injury.  We will see what the x-ray shows.  She has no back pain.  She did not hit her head this is a non-syncopal fall  ----------------------------------------- 6:54 PM on 11/06/2017 -----------------------------------------  Her son now tells me that he wants the patient to be evaluated for dehydration and possible UTI as he feels that she is been somewhat weaker recently.  We will evaluate her further she has no complaints at this time, will discuss with orthopedic surgery the findings on her hip.  ----------------------------------------- 9:57 PM on 11/06/2017 -----------------------------------------  Dr. Ernest Pine feels the patient's hip may have a slight new crack in it but is otherwise intact he does not wish further x-raying any feels that she is weightbearing as tolerated does not mandate admission for this.  I talked extensively to the family, she does have a history of being somewhat confused, at baseline, her potassium somewhat low we will replace that  here and continue replacing at home.  I have offered admission to the hospital her blood pressure somewhat up but she is agitated about being here and has a history of same.  Patient and family had a very long discussion about whether would be better for her to be admitted or discharged and they feel strongly to be better for her to go home.  They understand that this display some limitations upon the my ability to take care of her.  She does have a history of dementia they do state that she has been somewhat more confused over the last month she is being followed by her primary care doctor for this or try to change medications around.  They do agree this was a non-syncopal fall, they do not think it would be good for her to be admitted because they feel that she will become acutely agitated if she were admitted and they worried that this may start if we do not get her home now.  I would discharge with her home with potassium supplementation, she already is on high-dose narcotics and about due for that and would like to take her home medications when she gets there.  She will follow-up with Dr. Ernest Pine, and her primary care doctor.  Fascially and patient know that anytime with a chance of mind about admission and or wish other investigation they can come back.  Blood pressure noted, he was normal when she came here and as she gets more anxious about going home and goes up I think the care for this likely will be discharged and again given  her history of dementia and agitation, family very strongly opposed to admission at this time and I do not think is unreasonable.    ____________________________________________   FINAL CLINICAL IMPRESSION(S) / ED DIAGNOSES  Final diagnoses:  Fall      This chart was dictated using voice recognition software.  Despite best efforts to proofread,  errors can occur which can change meaning.      Jeanmarie Plant, MD 11/06/17 1733    Jeanmarie Plant, MD 11/06/17  [id number]    Jeanmarie Plant, MD 11/06/17 2158

## 2017-11-06 NOTE — Discharge Instructions (Signed)
You have a slight crack in her hip continue taking her pain medications.  Her orthopedic surgeon suggested weightbearing as tolerated with walker her potassium is somewhat low here, please take home potassium as prescribed.  Return to the emergency room for any new or worrisome symptoms.  Blood pressure somewhat elevated here, please make sure the see her doctor tomorrow if possible to have it rechecked.  We had a long discussion about whether you wish to be admitted to the hospital and at this point he would prefer not to be.   this is not an unreasonable choice however does limit our ability to continue to take care of you here.  If you change your mind or feel worse in any way please return to the emergency department.  I would advise you talk to her home doctor about getting home health care to help you with this patient, and physical therapy at as well could be arranged by her PCP at home.  If you have any concerns we are always available and would like to see you come back to the hospital for further care if needed.

## 2017-11-06 NOTE — ED Triage Notes (Signed)
Pt arrived via [facility] from home. EMS stated that pt was walking in home and tripped on walker and fell. Pt states that she fell on the right leg and is having pain in both legs. Pt also states that she had surgery on right hip last year. Has skin tear on right arm and both legs.

## 2017-11-07 ENCOUNTER — Ambulatory Visit: Payer: Self-pay | Admitting: Urology

## 2017-11-12 DIAGNOSIS — G2111 Neuroleptic induced parkinsonism: Secondary | ICD-10-CM | POA: Insufficient documentation

## 2017-11-13 ENCOUNTER — Other Ambulatory Visit: Payer: Self-pay | Admitting: Urology

## 2017-11-18 ENCOUNTER — Emergency Department: Payer: Medicare Other

## 2017-11-18 ENCOUNTER — Emergency Department
Admission: EM | Admit: 2017-11-18 | Discharge: 2017-11-21 | Disposition: A | Discharge status: Rehabilitation Facility | Payer: Medicare Other | Attending: Emergency Medicine | Admitting: Emergency Medicine

## 2017-11-18 ENCOUNTER — Encounter: Payer: Self-pay | Admitting: Emergency Medicine

## 2017-11-18 ENCOUNTER — Other Ambulatory Visit: Payer: Self-pay

## 2017-11-18 DIAGNOSIS — J449 Chronic obstructive pulmonary disease, unspecified: Secondary | ICD-10-CM | POA: Insufficient documentation

## 2017-11-18 DIAGNOSIS — M25559 Pain in unspecified hip: Secondary | ICD-10-CM

## 2017-11-18 DIAGNOSIS — Z87891 Personal history of nicotine dependence: Secondary | ICD-10-CM | POA: Insufficient documentation

## 2017-11-18 DIAGNOSIS — Y929 Unspecified place or not applicable: Secondary | ICD-10-CM | POA: Diagnosis not present

## 2017-11-18 DIAGNOSIS — Y939 Activity, unspecified: Secondary | ICD-10-CM | POA: Diagnosis not present

## 2017-11-18 DIAGNOSIS — R51 Headache: Secondary | ICD-10-CM | POA: Diagnosis not present

## 2017-11-18 DIAGNOSIS — I10 Essential (primary) hypertension: Secondary | ICD-10-CM | POA: Insufficient documentation

## 2017-11-18 DIAGNOSIS — S3282XA Multiple fractures of pelvis without disruption of pelvic ring, initial encounter for closed fracture: Secondary | ICD-10-CM | POA: Insufficient documentation

## 2017-11-18 DIAGNOSIS — W19XXXA Unspecified fall, initial encounter: Secondary | ICD-10-CM | POA: Insufficient documentation

## 2017-11-18 DIAGNOSIS — Z79899 Other long term (current) drug therapy: Secondary | ICD-10-CM | POA: Diagnosis not present

## 2017-11-18 DIAGNOSIS — Y999 Unspecified external cause status: Secondary | ICD-10-CM | POA: Insufficient documentation

## 2017-11-18 DIAGNOSIS — M25551 Pain in right hip: Secondary | ICD-10-CM | POA: Diagnosis present

## 2017-11-18 DIAGNOSIS — N390 Urinary tract infection, site not specified: Secondary | ICD-10-CM | POA: Diagnosis not present

## 2017-11-18 DIAGNOSIS — E119 Type 2 diabetes mellitus without complications: Secondary | ICD-10-CM | POA: Insufficient documentation

## 2017-11-18 LAB — URINALYSIS, COMPLETE (UACMP) WITH MICROSCOPIC
BACTERIA UA: NONE SEEN
Bilirubin Urine: NEGATIVE
Glucose, UA: NEGATIVE mg/dL
Hgb urine dipstick: NEGATIVE
KETONES UR: 20 mg/dL — AB
Nitrite: NEGATIVE
PH: 5 (ref 5.0–8.0)
PROTEIN: 30 mg/dL — AB
Specific Gravity, Urine: 1.026 (ref 1.005–1.030)

## 2017-11-18 LAB — COMPREHENSIVE METABOLIC PANEL
ALT: 8 U/L — AB (ref 14–54)
AST: 26 U/L (ref 15–41)
Albumin: 2.9 g/dL — ABNORMAL LOW (ref 3.5–5.0)
Alkaline Phosphatase: 196 U/L — ABNORMAL HIGH (ref 38–126)
Anion gap: 8 (ref 5–15)
BUN: 18 mg/dL (ref 6–20)
CHLORIDE: 104 mmol/L (ref 101–111)
CO2: 23 mmol/L (ref 22–32)
CREATININE: 0.46 mg/dL (ref 0.44–1.00)
Calcium: 8.4 mg/dL — ABNORMAL LOW (ref 8.9–10.3)
GFR calc Af Amer: >60 mL/min (ref >60)
Glucose, Bld: 119 mg/dL — ABNORMAL HIGH (ref 65–99)
Potassium: 3.7 mmol/L (ref 3.5–5.1)
Sodium: 135 mmol/L (ref 135–145)
Total Bilirubin: 0.5 mg/dL (ref 0.3–1.2)
Total Protein: 5.9 g/dL — ABNORMAL LOW (ref 6.5–8.1)

## 2017-11-18 LAB — CBC
HEMATOCRIT: 37.1 % (ref 35.0–47.0)
HEMOGLOBIN: 12.4 g/dL (ref 12.0–16.0)
MCH: 30.3 pg (ref 26.0–34.0)
MCHC: 33.3 g/dL (ref 32.0–36.0)
MCV: 91 fL (ref 80.0–100.0)
PLATELETS: 507 10*3/uL — AB (ref 150–440)
RBC: 4.08 MIL/uL (ref 3.80–5.20)
RDW: 17.6 % — ABNORMAL HIGH (ref 11.5–14.5)
WBC: 8.9 10*3/uL (ref 3.6–11.0)

## 2017-11-18 MED ORDER — DULOXETINE HCL 60 MG PO CPEP
60.0000 mg | ORAL_CAPSULE | ORAL | Status: DC
  Administered 2017-11-19 – 2017-11-21 (×3): 60 mg via ORAL
  Filled 2017-11-18 (×3): qty 1

## 2017-11-18 MED ORDER — POTASSIUM CHLORIDE CRYS ER 20 MEQ PO TBCR
20.0000 meq | EXTENDED_RELEASE_TABLET | Freq: Every day | ORAL | Status: DC
  Administered 2017-11-19 – 2017-11-21 (×3): 20 meq via ORAL
  Filled 2017-11-18 (×3): qty 1

## 2017-11-18 MED ORDER — ALBUTEROL SULFATE (2.5 MG/3ML) 0.083% IN NEBU: 2.5000 mg | INHALATION_SOLUTION | Freq: Four times a day (QID) | RESPIRATORY_TRACT | Status: DC | PRN

## 2017-11-18 MED ORDER — MORPHINE SULFATE ER 15 MG PO TBCR
15.0000 mg | EXTENDED_RELEASE_TABLET | Freq: Two times a day (BID) | ORAL | Status: DC
  Administered 2017-11-19 – 2017-11-21 (×5): 15 mg via ORAL
  Filled 2017-11-18 (×6): qty 1

## 2017-11-18 MED ORDER — PANTOPRAZOLE SODIUM 40 MG PO TBEC
40.0000 mg | DELAYED_RELEASE_TABLET | Freq: Every day | ORAL | Status: DC
  Administered 2017-11-19 – 2017-11-21 (×3): 40 mg via ORAL
  Filled 2017-11-18 (×3): qty 1

## 2017-11-18 MED ORDER — VITAMIN A 10000 UNITS PO CAPS
20000.0000 [IU] | ORAL_CAPSULE | Freq: Every day | ORAL | Status: DC
  Administered 2017-11-19 – 2017-11-21 (×3): 20000 [IU] via ORAL
  Filled 2017-11-18 (×4): qty 2

## 2017-11-18 MED ORDER — SODIUM CHLORIDE 0.9 % IV BOLUS
1000.0000 mL | Freq: Once | INTRAVENOUS | Status: AC
  Administered 2017-11-18: 1000 mL via INTRAVENOUS

## 2017-11-18 MED ORDER — ACETAMINOPHEN 500 MG PO TABS
1000.0000 mg | ORAL_TABLET | Freq: Once | ORAL | Status: AC
  Administered 2017-11-18: 1000 mg via ORAL
  Filled 2017-11-18: qty 2

## 2017-11-18 MED ORDER — DULOXETINE HCL 30 MG PO CPEP
30.0000 mg | ORAL_CAPSULE | Freq: Every day | ORAL | Status: DC
  Administered 2017-11-19 – 2017-11-20 (×3): 30 mg via ORAL
  Filled 2017-11-18 (×4): qty 1

## 2017-11-18 MED ORDER — FOLIC ACID 1 MG PO TABS
1.0000 mg | ORAL_TABLET | Freq: Every day | ORAL | Status: DC
  Administered 2017-11-19 – 2017-11-21 (×3): 1 mg via ORAL
  Filled 2017-11-18 (×3): qty 1

## 2017-11-18 MED ORDER — LAMOTRIGINE 25 MG PO TABS
50.0000 mg | ORAL_TABLET | Freq: Two times a day (BID) | ORAL | Status: DC
  Administered 2017-11-19: 50 mg via ORAL
  Filled 2017-11-18: qty 2

## 2017-11-18 MED ORDER — QUETIAPINE FUMARATE 25 MG PO TABS
50.0000 mg | ORAL_TABLET | Freq: Every day | ORAL | Status: DC
  Administered 2017-11-19 – 2017-11-20 (×3): 50 mg via ORAL
  Filled 2017-11-18 (×3): qty 2

## 2017-11-18 MED ORDER — OXYCODONE HCL 5 MG PO TABS
10.0000 mg | ORAL_TABLET | Freq: Once | ORAL | Status: AC
  Administered 2017-11-18: 10 mg via ORAL
  Filled 2017-11-18: qty 2

## 2017-11-18 MED ORDER — CEPHALEXIN 250 MG PO CAPS
250.0000 mg | ORAL_CAPSULE | Freq: Every day | ORAL | Status: DC
  Administered 2017-11-19 – 2017-11-20 (×3): 250 mg via ORAL
  Filled 2017-11-18 (×3): qty 1

## 2017-11-18 MED ORDER — HYDROCODONE-ACETAMINOPHEN 5-325 MG PO TABS
1.0000 | ORAL_TABLET | Freq: Four times a day (QID) | ORAL | Status: DC | PRN
  Administered 2017-11-19 – 2017-11-21 (×5): 1 via ORAL
  Filled 2017-11-18 (×5): qty 1

## 2017-11-18 NOTE — ED Notes (Signed)
Family updated that pt would be spending the night. Pt is okay with the plan and daughter verbalized understanding. Daughter reports pt has started hallucinating again and pt continued to tell this RN about the animal that was biting her leg. Pt also started talking about someone going to school and when Rn asked who she was talking about pt reported she was not talking. Pt is in NAD and denies the hallucinations are attempting to really hurt her and denies feeling the need to hurt herself.

## 2017-11-18 NOTE — ED Notes (Signed)
MD at bedside. 

## 2017-11-18 NOTE — ED Notes (Signed)
Pt able to stand with walker and able to take multiple steps with assistance. Pt did not appear comfortable and was grimacing with each movement. Pt was slow to put weight on right leg and took 5 minutes to make it 6 steps. Pt does not appear to be able to ambulate well even after pain medication. Family and pt talking through options and MD aware.

## 2017-11-18 NOTE — ED Triage Notes (Signed)
Here for headaches since fall.  At times has had auditory hallucinations.  Alert and oriented currently.  Family reports neurologist spoke with them over phone and recommended coming in for repeat head scan and may be spot of concern on previous one.  Pt has also not been able to bear weight on hip last few days.  No blood thinners. VSS.

## 2017-11-18 NOTE — ED Notes (Signed)
Patient transported to CT 

## 2017-11-18 NOTE — ED Provider Notes (Signed)
[facility] Emergency Department Provider Note  ____________________________________________  Time seen: Approximately 6:05 PM  I have reviewed the triage vital signs and the nursing notes.   HISTORY  Chief Complaint fall re-evaluation   HPI Michelle Cabrera is a 72 y.o. female with history as listed below who presents for fall reevaluation.  Patient was here 12 days ago after mechanical fall.  Patient was complaining of right hip pain and had a head trauma.  CT of the head did not show anything acute but did show a complex chronic appearing left inferior frontal subdural collection.  X-ray of the hip was concerning for possible subtrochanteric right femur fracture.  Dr. Ernest Pine was consulted and since patient was ambulatory in the ED he felt she did not warrant any further imaging and she was dc home. Patient has had significant pain in the right hip since the fall and over the last week has been unable to ambulate.  The husband is to carry her in the house.  She reports that the pain is mild at rest, severe with movement or weightbearing, located in the anterior aspect of the right hip, constant and nonradiating.  Patient is also complaining of worsening auditory and visual hallucinations.  Patient has had them since 2017.  She is followed by neurology who believes patient may have Parkinson's.  They have been trying to adjust her medications recently to control the hallucinations.  According to her husband her hallucinations were well controlled since the fall and they have been pretty significant since then.  She would hear radio playing, will see people at there is none, will see animals.  They spoke with her neurologist recommended return to the emergency room for repeat head CT since the initial one showed a subdural hematoma.  Patient denies any headache, suicidal homicidal ideation, personal or family history of psychiatric illness.   Past Medical History:    Diagnosis Date  . Anxiety   . Arthritis   . Cataract   . Cervicalgia   . Chronic lower back pain   . Closed compression fracture of L1 lumbar vertebra 11/2012  . Collagen vascular disease (HCC)   . COPD (chronic obstructive pulmonary disease) (HCC)    unspecified  . DDD (degenerative disc disease)   . Depression   . Diabetes mellitus (HCC)   . Disorder of bursae and tendons in shoulder region    unspecified  . Fall 09/27/2016   Fall Feb 04, 2016 with broken vertebra  . GERD (gastroesophageal reflux disease)   . Headache   . Hereditary and idiopathic peripheral neuropathy    unspecified  . History of adenomatous polyp of colon    Followed by Dr. Markham Jordan  . History of tubal ligation   . Hypertension   . Intermediate coronary syndrome (HCC)   . Lumbago   . Neuralgia and neuritis, unspecified    radiculitis unspecified  . Rheumatoid arthritis (HCC)   . Rheumatoid arthritis (HCC)   . Senile osteoporosis   . Spinal stenosis, lumbar region without neurogenic claudication     Patient Active Problem List   Diagnosis Date Noted  . Subacute delirium 05/13/2017  . Chronic prescription opiate use 05/13/2017  . Constipation 05/13/2017  . UTI (urinary tract infection) 05/09/2017  . Tachycardia 03/13/2017  . Protein calorie malnutrition (HCC) 03/13/2017  . Closed fracture of right olecranon process with routine healing 03/13/2017  . Hip fracture (HCC) 02/18/2017  . Bilateral carotid artery stenosis 12/10/2016  . Subclavian arterial  stenosis (HCC) 12/10/2016  . Thoracic compression fracture (HCC) 02/04/2016  . UTI (lower urinary tract infection) 02/04/2016  . Schizophrenia spectrum disorder with psychotic disorder type not yet determined (HCC) 11/13/2015  . Syncope 11/10/2015  . Hypotension 11/10/2015  . 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 Pressure ulcer 09/08/2015  . New onset type 2 diabetes mellitus (HCC) 09/06/2015  . Altered mental status 09/06/2015  . COPD exacerbation  (HCC) 07/26/2015  . Arthritis associated with another disorder 09/15/2014  . Wedge compression fracture of T6 vertebra (HCC) 02/07/2014  . Intractable low back pain 12/27/2012  . DDD (degenerative disc disease) 12/27/2012  . Rheumatoid arthritis (HCC) 12/27/2012  . Hypertension 12/27/2012    Past Surgical History:  Procedure Laterality Date  . BACK SURGERY    . BREAST BIOPSY Left    negative  . CATARACT EXTRACTION W/ INTRAOCULAR LENS  IMPLANT, BILATERAL Bilateral   . COLONOSCOPY  08/17/2007   Adenomatous Polyps  . COLONOSCOPY  10/09/2010   PH Adenomatous polyps : [facility] 09/2015 ; Recall Ltr mailed 08/17/2015 (dw)  . ESOPHAGOGASTRODUODENOSCOPY  01/14/2008   No repeat per RTE  . FINGER ARTHROPLASTY Right 09/15/2014   Procedure: RIGHT MIDDLE FINGER, RING FINGER, SMALL FINGER EXTENSOR DIGITORUM COMMUMIS STABILIZATION WITH RIGHT MIDDLE FINGER MCP ARTHROPLASTY, POSSIBLE RING FINGER AND SMALL FINGER MCP ARTHROPLASTY;  Surgeon: Dominica Severin, MD;  Location: [facility] OR;  Service: Orthopedics;  Laterality: Right;  . FOOT SURGERY Bilateral   . HAND TENDON SURGERY Right 09/15/2014   "d/t RA"  . HARDWARE REMOVAL Right 04/16/2017   Procedure: HARDWARE REMOVAL;  Surgeon: Donato Heinz, MD;  Location: [facility] ORS;  Service: Orthopedics;  Laterality: Right;  . HUMERUS FRACTURE SURGERY Left   . INCISION AND DRAINAGE OF WOUND Right 04/16/2017   Procedure: IRRIGATION AND DEBRIDEMENT WOUND;  Surgeon: Peggye Form, DO;  Location: [facility] ORS;  Service: Plastics;  Laterality: Right;  . INTRAMEDULLARY (IM) NAIL INTERTROCHANTERIC Right 02/19/2017   Procedure: INTRAMEDULLARY (IM) NAIL INTERTROCHANTRIC;  Surgeon: Donato Heinz, MD;  Location: [facility] ORS;  Service: Orthopedics;  Laterality: Right;  . KYPHOPLASTY N/A 01/04/2013   Procedure: L1 Kyphoplasty;  Surgeon: Hewitt Shorts, MD;  Location: [facility] NEURO ORS;  Service: Neurosurgery;  Laterality: N/A;  L1 Kyphoplasty  . KYPHOPLASTY N/A 02/07/2014   Procedure: THORACIC  SIX KYPHOPLASTY;  Surgeon: Hewitt Shorts, MD;  Location: [facility]O OR[facility];  Service: Neurosurgery;  Laterality: N/A;  T6 Kyphoplasty   . KYPHOPLASTY N/A 02/08/2016   Procedure: KYPHOPLASTY  T-12;  Surgeon: Kennedy Bucker, MD;  Location: [facility] ORS;  Service: Orthopedics;  Laterality: N/A;  . KYPHOPLASTY N/A 10/01/2016   Procedure: KYPHOPLASTY  L-5;  Surgeon: Kennedy Bucker, MD;  Location: [facility] ORS;  Service: Orthopedics;  Laterality: N/A;  . LUMBAR DISC SURGERY  2014    X 3  . LUMBAR LAMINECTOMY    . ORIF ELBOW FRACTURE Right 02/19/2017   Procedure: OPEN REDUCTION INTERNAL FIXATION (ORIF) ELBOW/OLECRANON FRACTURE;  Surgeon: Donato Heinz, MD;  Location: [facility] ORS;  Service: Orthopedics;  Laterality: Right;  . PERIPHERAL VASCULAR CATHETERIZATION Left 12/07/2015   Procedure: Upper Extremity Angiography;  Surgeon: Annice Needy, MD;  Location: [facility] INVASIVE CV LAB;  Service: Cardiovascular;  Laterality: Left;  . PERIPHERAL VASCULAR CATHETERIZATION  12/07/2015   Procedure: Upper Extremity Intervention;  Surgeon: Annice Needy, MD;  Location: [facility] INV[facility] CV [facility];  Service: Cardiovascular;;  . POSTERIOR LUMBAR FUSION     "got screws in"  . TONSILLECTOMY    . TUBAL  LIGATION    . WRIST FUSION Right    30 yrs ago    Prior to Admission medications   Medication Sig Start Date End Date Taking? Authorizing Provider  albuterol (PROVENTIL HFA;VENTOLIN HFA) 108 (90 Base) MCG/ACT inhaler Inhale 2 puffs into the lungs every 6 (six) hours as needed for wheezing or shortness of breath.   Yes [provider]  cephALEXin (KEFLEX) 250 MG capsule Take 250 mg by mouth at bedtime.   Yes [provider]  DULoxetine (CYMBALTA) 30 MG capsule Take 30 mg by mouth at bedtime.   Yes [provider]  DULoxetine (CYMBALTA) 60 MG capsule Take 60 mg by mouth every morning.   Yes [provider]  folic acid (FOLVITE) 1 MG tablet Take 1 mg by mouth daily.   Yes [provider]   HYDROcodone-acetaminophen (NORCO) 5-325 MG tablet Take 1 tablet by mouth every 6 (six) hours as needed for moderate pain. 05/16/17  Yes Wieting, Richard, MD  lamoTRIgine (LAMICTAL) 25 MG tablet Take 50 mg by mouth 2 (two) times daily.    Yes [provider]  morphine (MS CONTIN) 15 MG 12 hr tablet Take 1 tablet (15 mg total) by mouth every 12 (twelve) hours. 05/16/17  Yes Wieting, Richard, MD  omeprazole (PRILOSEC) 20 MG capsule Take 20 mg by mouth 2 (two) times daily.    Yes [provider]  QUEtiapine (SEROQUEL) 25 MG tablet Take 1 tablet (25 mg total) by mouth at bedtime. Patient taking differently: Take 50 mg by mouth at bedtime.  05/16/17  Yes Alford Highland, MD  Sarilumab [facility]) 200 MG/1. SOAJ Inject 200 mg into the skin every 14 (fourteen) days.   Yes [provider]  vitamin A 12458 UNIT capsule Take 25,000 Units by mouth daily.   Yes [provider]  bisacodyl (DULCOLAX) 10 MG suppository Place 1 suppository (10 mg total) rectally daily as needed for moderate constipation. Patient not taking: Reported on 11/18/2017 02/21/17   Altamese Dilling, MD  cephALEXin [facility]) 250 MG capsule Take one capsule after intercourse Patient not taking: Reported on 11/18/2017 08/15/17   Michiel Cowboy A, PA-C  conjugated estrogens (PREMARIN) vaginal cream Apply 0.5mg  (pea-sized amount)  just inside the vaginal introitus with a finger-tip Monday, Wednesday and Friday nights. Patient not taking: Reported on 11/18/2017 05/23/17   Michiel Cowboy A, PA-C  estradiol (ESTRACE VAGINAL) 0.1 MG/GM vaginal cream Apply 0.5mg  (pea-sized amount)  just inside the vaginal introitus with a finger-tip every night for two weeks and then Monday, Wednesday and Friday nights. Patient not taking: Reported on 11/18/2017 05/23/17   Michiel Cowboy A, PA-C  ferrous sulfate 325 (65 FE) MG tablet Take 1 tablet (325 mg total) by mouth 2 (two) times daily with a meal. Patient not  taking: Reported on 11/18/2017 02/21/17   Altamese Dilling, MD  megestrol (MEGACE) 40 MG tablet Take 1 tablet (40 mg total) by mouth daily. Patient not taking: Reported on 11/18/2017 05/16/17   Alford Highland, MD  OLANZapine (ZYPREXA) 5 MG tablet Take 1 tablet (5 mg total) by mouth at bedtime. Patient not taking: Reported on 11/18/2017 05/16/17   Alford Highland, MD  potassium chloride 20 MEQ TBCR Take 20 mEq by mouth daily for 7 days. 11/06/17 11/13/17  Jeanmarie Plant, MD  predniSONE (DELTASONE) 10 MG tablet Take 2 tabs daily for 3 days; take one tab daily for 3 days; take 1/2 tab daily for 4 days Patient not taking: Reported on 11/18/2017 05/16/17  Alford Highland, MD    Allergies Ambien [zolpidem tartrate]; Dilaudid [hydromorphone hcl]; Gold-containing drug products; Adhesive [tape]; and Haldol [haloperidol]  Family History  Problem Relation Age of Onset  . CAD Father   . Alcohol abuse Father   . Heart disease Father   . CAD Mother   . Hypertension Mother   . Peripheral vascular disease Mother   . Osteoarthritis Mother   . Heart disease Mother   . Breast cancer Maternal Aunt 50  . Alcohol abuse Sister   . Arthritis Sister   . Cancer Sister   . Kidney cancer Neg Hx   . Kidney disease Neg Hx   . Prostate cancer Neg Hx     Social History Social History   Tobacco Use  . Smoking status: Former Smoker    Packs/day: 1.00    Years: 56.00    Pack years: 56.00    Types: Cigarettes    Last attempt to quit: 07/25/2015    Years since quitting: 2.3  . Smokeless tobacco: Never Used  Substance Use Topics  . Alcohol use: No    Alcohol/week: 0.0 oz  . Drug use: No    Review of Systems  Constitutional: Negative for fever. Eyes: Negative for visual changes. ENT: Negative for sore throat. Neck: No neck pain  Cardiovascular: Negative for chest pain. Respiratory: Negative for shortness of breath. Gastrointestinal: Negative for abdominal pain, vomiting or  diarrhea. Genitourinary: Negative for dysuria. Musculoskeletal: Negative for back pain. + R hip pain Skin: Negative for rash. Neurological: Negative for headaches, weakness or numbness. Psych: No SI or HI. + Auditory and visual hallucinations  ____________________________________________   PHYSICAL EXAM:  VITAL SIGNS: ED Triage Vitals  Enc Vitals Group     BP 11/18/17 1514 116/83     Pulse Rate 11/18/17 1514 (!) 105     Resp 11/18/17 1514 18     Temp 11/18/17 1514 98.1 F (36.7 C)     Temp Source 11/18/17 1514 Oral     SpO2 11/18/17 1514 95 %     Weight 11/18/17 1513 114 lb (51.7 kg)     Height 11/18/17 1513 5\' 2"  (1.575 m)     Head Circumference --      Peak Flow --      Pain Score 11/18/17 1513 7     Pain Loc --      Pain Edu? --      Excl. in GC? --     Constitutional: Alert and oriented. Well appearing and in no apparent distress. HEENT:      Head: Normocephalic and atraumatic.         Eyes: Conjunctivae are normal. Sclera is non-icteric.       Mouth/Throat: Mucous membranes are moist.       Neck: Supple with no signs of meningismus. Cardiovascular: Regular rate and rhythm. No murmurs, gallops, or rubs. 2+ symmetrical distal pulses are present in all extremities. No JVD. Respiratory: Normal respiratory effort. Lungs are clear to auscultation bilaterally. No wheezes, crackles, or rhonchi.  Gastrointestinal: Soft, non tender, and non distended with positive bowel sounds. No rebound or guarding. Genitourinary: No CVA tenderness. Musculoskeletal: Patient has tenderness with internal and external rotation of the right hip and palpation of the anterior hip, no obvious deformity.  No CT and L-spine tenderness.   Neurologic: Normal speech and language. Face is symmetric. Moving all extremities. No gross focal neurologic deficits are appreciated. Skin: Skin is warm, dry and intact. No rash noted. Psychiatric: Mood  and affect are normal. Speech and behavior are  normal.  ____________________________________________   LABS (all labs ordered are listed, but only abnormal results are displayed)  Labs Reviewed  CBC - Abnormal; Notable for the following components:      Result Value   RDW 17.6 (*)    Platelets 507 (*)    All other components within normal limits  COMPREHENSIVE METABOLIC PANEL - Abnormal; Notable for the following components:   Glucose, Bld 119 (*)    Calcium 8.4 (*)    Total Protein 5.9 (*)    Albumin 2.9 (*)    ALT 8 (*)    Alkaline Phosphatase 196 (*)    All other components within normal limits  URINALYSIS, COMPLETE (UACMP) WITH MICROSCOPIC - Abnormal; Notable for the following components:   Color, Urine AMBER (*)    APPearance HAZY (*)    Ketones, ur 20 (*)    Protein, ur 30 (*)    Leukocytes, UA SMALL (*)    All other components within normal limits  URINE CULTURE   ____________________________________________  EKG  none  ____________________________________________  RADIOLOGY  I have personally reviewed the images performed during this visit and I agree with the Radiologist's read.   Interpretation by Radiologist:  Ct Head Wo Contrast  Result Date: 11/18/2017 CLINICAL DATA:  72 y/o F; headaches since fall 2 weeks ago. Occasional auditory hallucinations. EXAM: CT HEAD WITHOUT CONTRAST TECHNIQUE: Contiguous axial images were obtained from the base of the skull through the vertex without intravenous contrast. COMPARISON:  11/06/2017 CT head.  09/15/2017 MRI head. FINDINGS: Brain: No evidence of acute infarction, hemorrhage, hydrocephalus, extra-axial collection or mass lesion/mass effect. Decreased size and attenuation thin left anterolateral frontal lobe subdural collection indicating partial interval resorption, no associated mass effect on the brain. Stable chronic microvascular ischemic changes and parenchymal volume loss of the brain. Vascular: Calcific atherosclerosis of the carotid siphons. Hyperdense  vessel. Skull: Normal. Negative for fracture or focal lesion. Sinuses/Orbits: No acute finding. Other: None. IMPRESSION: 1. No acute intracranial abnormality. 2. Interval partial resorption of small left anterolateral frontal chronic subdural hematoma. No associated mass effect. 3. Stable chronic microvascular ischemic changes and parenchymal volume loss the brain. Electronically Signed   By: Mitzi Hansen M.D.   On: 11/18/2017 15:47   Ct Hip Right Wo Contrast  Result Date: 11/18/2017 CLINICAL DATA:  Right hip fracture.  Patient unable to bear weight. EXAM: CT OF THE RIGHT HIP WITHOUT CONTRAST TECHNIQUE: Multidetector CT imaging of the right hip was performed according to the standard protocol. Multiplanar CT image reconstructions were also generated. COMPARISON:  11/18/2017 and 11/06/2017 radiographs FINDINGS: Bones/Joint/Cartilage Acute to subacute right inferior and superior pubic rami fractures are noted, involving the midportion of the right inferior pubic ramus and with a nondisplaced fracture at the junction of the right acetabulum and superior pubic ramus. The fracture also involves the anterior wall of the acetabulum. Minimally displaced right parasymphyseal fracture is noted. No joint dislocation. Pre-existing partially included long-stem uncemented right hip arthroplasty is redemonstrated with heterotopic new bone formation identified and incomplete osseous union demonstrated. The suspected acute on chronic fracture lucency involving the subtrochanteric lateral portion of the proximal femoral shaft can be identified on the prior CT from 2018 and therefore chronic. Small joint effusion. Ligaments Suboptimally assessed by CT. Muscles and Tendons No intramuscular hemorrhage or atrophy. Soft tissues Subcutaneous postop scarring along the lateral aspect of the right hip. IMPRESSION: 1. Acute to subacute right superior and inferior pubic rami  fractures are demonstrated on CT, with fracture of the  right superior pubic ramus at the junction of the acetabulum and pubic ramus. The fracture extends into the anterior wall of the acetabulum without significant displacement. The inferior pubic ramus fracture is minimally displaced 1/2 shaft width. These are radiographically occult. 2. A nondisplaced acute to subacute right parasymphyseal fracture is confirmed. 3. The lucency along the lateral aspect of the subtrochanteric right femur can be identified on a remote CT from 2018 and therefore chronic. Electronically Signed   By: Tollie Eth M.D.   On: 11/18/2017 18:10   Dg Hip Unilat With Pelvis 2-3 Views Right  Result Date: 11/18/2017 CLINICAL DATA:  Hip pain after multiple falls EXAM: DG HIP (WITH OR WITHOUT PELVIS) 2-3V RIGHT COMPARISON:  11/06/2017, 02/18/2017 FINDINGS: Status post intramedullary rod and screw fixation of the right femur for prior intertrochanteric fracture with similar alignment. Minimal periosteal change at the lateral cortex of the subtrochanteric region, similar compared to November 06 2017 radiograph, possible subacute on chronic fracture. No change in alignment. Right femoral head projects in joint. There may be subacute fracture at the right pubic bone. IMPRESSION: 1. Status post intramedullary rod and screw fixation of right femur for prior intertrochanteric fracture. Similar appearance and alignment of possible subacute on chronic fracture at the lateral cortex of the subtrochanteric proximal femur. 2. Possible subacute fracture of the right pubic bone. Electronically Signed   By: Jasmine Pang M.D.   On: 11/18/2017 16:04     ____________________________________________   PROCEDURES  Procedure(s) performed: None Procedures Critical Care performed:  None ____________________________________________   INITIAL IMPRESSION / ASSESSMENT AND PLAN / ED COURSE  72 y.o. female with history as listed below who presents for fall reevaluation.  # hip pain: Patient is tender with  internal and external rotation of the right hip and palpation of the anterior aspect of the right hip.  There are no deformities.  X-ray shows the persistent subtrochanteric femur fracture with well-positioned hardware.  I discussed with Dr. Rosita Kea who is on-call for orthopedics and he recommended CT of the hip which is pending.   #Hallucinations: Seems to be an ongoing issue for patient since 2017.  Her neurologist believes that this could be Parkinson.  Patient reports that these have been worse since the fall 12 days ago.  Initial CT head showed a chronic appearing small subdural hematoma.  Repeat CT today shows no acute abnormalities.  Patient is completely neurologically intact.  Basic labs have been sent to rule out UTI, dehydration, or any other possible etiology for these worsening hallucinations.    _________________________ 8:46 PM on 11/18/2017 ----------------------------------------- CT of the hip showing several pelvic fractures.  Discussed with Dr. Rosita Kea who recommended placement for rehab, pain control.  No surgical management indicated.  Patient was given pain medication however still unable to ambulate with steady gait and is at great fall risk at this time.  Family wishes patient be placed in a rehab.  Will consult social work and physical therapy for placement. Patient meds have been ordered.     As part of my medical decision making, I reviewed the following data within the electronic MEDICAL RECORD NUMBER History obtained from family, Nursing notes reviewed and incorporated, Labs reviewed , Old chart reviewed, Radiograph reviewed , A consult was requested and obtained from this/these consultant(s) PT and SW, Notes from prior ED visits and [place] Controlled Substance Database    Pertinent labs & imaging results that were available during my  care of the patient were reviewed by me and considered in my medical decision making (see chart for  details).    ____________________________________________   FINAL CLINICAL IMPRESSION(S) / ED DIAGNOSES  Final diagnoses:  Hip pain  Multiple closed fractures of pelvis without disruption of pelvic ring, initial encounter (HCC)      NEW MEDICATIONS STARTED DURING THIS VISIT:  ED Discharge Orders    None       Note:  This document was prepared using Dragon voice recognition software and may include unintentional dictation errors.    Don Perking, [place], MD 11/19/17 0010

## 2017-11-19 MED ORDER — SODIUM CHLORIDE 0.9 % IV BOLUS
1000.0000 mL | Freq: Once | INTRAVENOUS | Status: AC
  Administered 2017-11-19: 1000 mL via INTRAVENOUS

## 2017-11-19 NOTE — ED Provider Notes (Signed)
Clinical Course as of Nov 20 923  Wed Nov 19, 2017  0922 D/w neurology clinic nurse practitioner who advises that with the slight change in LFTs, she would recommend discontinuing the Lamictal. This appears to be a source of many of the patient's side effects also, so this will hopefully assuage their concerns about medication adjustment.   [PS]    Clinical Course User Index [PS] Sharman Cheek, MD    Final diagnoses:  Hip pain  Multiple closed fractures of pelvis without disruption of pelvic ring, initial encounter [facility] [place])      Sharman Cheek, MD 11/19/17 [phone]

## 2017-11-19 NOTE — ED Notes (Signed)
Pt awakens to take medications, pt repositioned on to her R side to prevent pressure ulcers. Pt's daughter remains at bedside awaiting social worker. Pt able to take meds with apple sauce. NO further needs at this time. Will continue to monitor for further patient needs.

## 2017-11-19 NOTE — ED Notes (Signed)
Pt's daughter returned to bedside. Pt sat up in bed to eat lunch. Pt c/o pain at this time. Will administer Vicodin per PRN orders.

## 2017-11-19 NOTE — [place] FL2 (Signed)
[place] MEDICAID FL2 LEVEL OF CARE SCREENING TOOL     IDENTIFICATION  Patient Name: Michelle Cabrera Birthdate: 11-Mar-1946 Sex: female Admission Date (Current Location): 11/18/2017  [place] and [place] Number:  Chiropodist and Address:  [facility], [address], [place], [place] [address]      Provider Number: [id number]  Attending Physician Name and Address:  No att. providers found  Relative Name and Phone Number:  Daughter Trey Paula [phone](m) [phone](h)    Current Level of Care: Hospital Recommended Level of Care: Skilled Nursing Facility Prior Approval Number:    Date Approved/Denied:   PASRR Number:    Discharge Plan: SNF    Current Diagnoses: Patient Active Problem List   Diagnosis Date Noted  . Subacute delirium 05/13/2017  . Chronic prescription opiate use 05/13/2017  . Constipation 05/13/2017  . UTI (urinary tract infection) 05/09/2017  . Tachycardia 03/13/2017  . Protein calorie malnutrition (HCC) 03/13/2017  . Closed fracture of right olecranon process with routine healing 03/13/2017  . Hip fracture (HCC) 02/18/2017  . Bilateral carotid artery stenosis 12/10/2016  . Subclavian arterial stenosis (HCC) 12/10/2016  . Thoracic compression fracture (HCC) 02/04/2016  . UTI (lower urinary tract infection) 02/04/2016  . Schizophrenia spectrum disorder with psychotic disorder type not yet determined (HCC) 11/13/2015  . Syncope 11/10/2015  . Hypotension 11/10/2015  . 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 Pressure ulcer 09/08/2015  . New onset type 2 diabetes mellitus (HCC) 09/06/2015  . Altered mental status 09/06/2015  . COPD exacerbation (HCC) 07/26/2015  . Arthritis associated with another disorder 09/15/2014  . Wedge compression fracture of T6 vertebra (HCC) 02/07/2014  . Intractable low back pain 12/27/2012  . DDD (degenerative disc disease) 12/27/2012  . Rheumatoid arthritis (HCC) 12/27/2012  .  Hypertension 12/27/2012    Orientation RESPIRATION BLADDER Height & Weight     Self, Place, Situation  Normal Incontinent Weight: 114 lb (51.7 kg) Height:  5\' 2"  (157.5 cm)  BEHAVIORAL SYMPTOMS/MOOD NEUROLOGICAL BOWEL NUTRITION STATUS      Continent Diet(Normal Diet)  AMBULATORY STATUS COMMUNICATION OF NEEDS Skin   Extensive Assist Verbally Normal                       Personal Care Assistance Level of Assistance  Bathing, Feeding, Dressing Bathing Assistance: Maximum assistance Feeding assistance: Limited assistance Dressing Assistance: Maximum assistance     Functional Limitations Info  Sight, Hearing, Speech Sight Info: Adequate Hearing Info: Adequate Speech Info: Adequate    SPECIAL CARE FACTORS FREQUENCY  PT (By licensed PT)     PT Frequency: 5x              Contractures Contractures Info: Not present    Additional Factors Info  Code Status, Allergies, Psychotropic Code Status Info: Full Allergies Info: Ambien Zolpidem Tartrate, Dilaudid Hydromorphone Hcl, Gold-containing Drug Products, Adhesive Tape, Haldol Haloperidol Psychotropic Info: See MAR         Current Medications (11/19/2017):  This is the current hospital active medication list Current Facility-Administered Medications  Medication Dose Route Frequency Provider Last Rate Last Dose  . albuterol (PROVENTIL) (2.5 MG/3ML) 0.083% nebulizer solution 2.5 mg  2.5 mg Inhalation Q6H PRN 11/21/2017, Don Perking, MD      . cephALEXin (KEFLEX) capsule 250 mg  250 mg Oral QHS [place], Don Perking, MD   250 mg at 11/19/17 0036  . DULoxetine (CYMBALTA) DR capsule 30 mg  30 mg Oral QHS 11/21/17, MD  30 mg at 11/19/17 0032  . DULoxetine (CYMBALTA) DR capsule 60 mg  60 mg Oral Leeanne Deed, [place], MD   60 mg at 11/19/17 1478  . folic acid (FOLVITE) tablet 1 mg  1 mg Oral Daily Don Perking, [place], MD   1 mg at 11/19/17 2956  . HYDROcodone-acetaminophen (NORCO/VICODIN) 5-325 MG per tablet 1 tablet  1  tablet Oral Q6H PRN Nita Sickle, MD   1 tablet at 11/19/17 1356  . morphine (MS CONTIN) 12 hr tablet 15 mg  15 mg Oral Q12H Nita Sickle, MD   Stopped at 11/19/17 [phone]  . pantoprazole (PROTONIX) EC tablet 40 mg  40 mg Oral Daily Don Perking, [place], MD   40 mg at 11/19/17 8657  . potassium chloride SA (K-DUR,KLOR-CON) CR tablet 20 mEq  20 mEq Oral Daily Don Perking, [place], MD   20 mEq at 11/19/17 8469  . QUEtiapine (SEROQUEL) tablet 50 mg  50 mg Oral QHS Don Perking, [place], MD   50 mg at 11/19/17 0035  . vitamin A capsule 20,000 Units  20,000 Units Oral Daily Nita Sickle, MD   20,000 Units at 11/19/17 6295   Current Outpatient Medications  Medication Sig Dispense Refill  . albuterol (PROVENTIL HFA;VENTOLIN HFA) 108 (90 Base) MCG/ACT inhaler Inhale 2 puffs into the lungs every 6 (six) hours as needed for wheezing or shortness of breath.    . cephALEXin (KEFLEX) 250 MG capsule Take 250 mg by mouth at bedtime.    . DULoxetine (CYMBALTA) 30 MG capsule Take 30 mg by mouth at bedtime.    . DULoxetine (CYMBALTA) 60 MG capsule Take 60 mg by mouth every morning.    . folic acid (FOLVITE) 1 MG tablet Take 1 mg by mouth daily.    Marland Kitchen HYDROcodone-acetaminophen (NORCO) 5-325 MG tablet Take 1 tablet by mouth every 6 (six) hours as needed for moderate pain. 30 tablet 0  . lamoTRIgine (LAMICTAL) 25 MG tablet Take 50 mg by mouth 2 (two) times daily.     Marland Kitchen morphine (MS CONTIN) 15 MG 12 hr tablet Take 1 tablet (15 mg total) by mouth every 12 (twelve) hours. 20 tablet 0  . omeprazole (PRILOSEC) 20 MG capsule Take 20 mg by mouth 2 (two) times daily.     . QUEtiapine (SEROQUEL) 25 MG tablet Take 1 tablet (25 mg total) by mouth at bedtime. (Patient taking differently: Take 50 mg by mouth at bedtime. ) 30 tablet 0  . Sarilumab (KEVZARA) 200 MG/1. SOAJ Inject 200 mg into the skin every 14 (fourteen) days.    . vitamin A 28413 UNIT capsule Take 25,000 Units by mouth daily.    . bisacodyl (DULCOLAX)  10 MG suppository Place 1 suppository (10 mg total) rectally daily as needed for moderate constipation. (Patient not taking: Reported on 11/18/2017) 12 suppository 0  . cephALEXin (KEFLEX) 250 MG capsule Take one capsule after intercourse (Patient not taking: Reported on 11/18/2017) 90 capsule 0  . conjugated estrogens (PREMARIN) vaginal cream Apply 0.5mg  (pea-sized amount)  just inside the vaginal introitus with a finger-tip Monday, Wednesday and Friday nights. (Patient not taking: Reported on 11/18/2017) 30 g 12  . estradiol (ESTRACE VAGINAL) 0.1 MG/GM vaginal cream Apply 0.5mg  (pea-sized amount)  just inside the vaginal introitus with a finger-tip every night for two weeks and then Monday, Wednesday and Friday nights. (Patient not taking: Reported on 11/18/2017) 30 g 12  . ferrous sulfate 325 (65 FE) MG tablet Take 1 tablet (325 mg total) by mouth 2 (two) times daily  with a meal. (Patient not taking: Reported on 11/18/2017) 60 tablet 3  . megestrol (MEGACE) 40 MG tablet Take 1 tablet (40 mg total) by mouth daily. (Patient not taking: Reported on 11/18/2017) 30 tablet 0  . OLANZapine (ZYPREXA) 5 MG tablet Take 1 tablet (5 mg total) by mouth at bedtime. (Patient not taking: Reported on 11/18/2017) 30 tablet 0  . potassium chloride 20 MEQ TBCR Take 20 mEq by mouth daily for 7 days. 10 tablet 0  . predniSONE (DELTASONE) 10 MG tablet Take 2 tabs daily for 3 days; take one tab daily for 3 days; take 1/2 tab daily for 4 days (Patient not taking: Reported on 11/18/2017) 11 tablet 0     Discharge Medications: Please see discharge summary for a list of discharge medications.  Relevant Imaging Results:  Relevant Lab Results:   Additional Information SSN: [id number]  Dominic Pea, LCSW

## 2017-11-19 NOTE — ED Notes (Signed)
Pt changed and placed into clean brief at this time. Pt tolerated well. Pt returned to 4H without incident.

## 2017-11-19 NOTE — Evaluation (Signed)
Physical Therapy Evaluation Patient Details Name: Michelle Cabrera MRN: [id number] DOB: 1946/02/15 Today's Date: 11/19/2017   History of Present Illness  Pt admitted for R pubic rami fractures s/p fall on 4/11. Per family, pt hasn't ambulated much since the fall. Complained of dizziness prior to fall at home. Previous history of R hip fracture s/p IM nailing. Currently complains of increased pain on L LE>R LE.  Clinical Impression  Pt is a pleasant 72 year old female who was admitted for pubic rami fractures and multiple falls, now with severe debility and deconditioning. Pt performs bed mobility with mod assist and transfers with max assist, unable to ambulate at this time due to pain. Pt demonstrates deficits with strength/mobility/endurance/pain. Pt appears motivated to participate and reports she wants to walk again. Currently not at baseline level since acute fall. Not safe to return home at this time. Would benefit from skilled PT to address above deficits and promote optimal return to PLOF; recommend transition to STR upon discharge from acute hospitalization.       Follow Up Recommendations SNF    Equipment Recommendations  None recommended by PT    Recommendations for Other Services       Precautions / Restrictions Precautions Precautions: Fall Restrictions Weight Bearing Restrictions: No      Mobility  Bed Mobility Overal bed mobility: Needs Assistance Bed Mobility: Supine to Sit     Supine to sit: Mod assist     General bed mobility comments: able to initiate transfer to EOB, needs help to scoot hips out towards EOB and walk feet over to EOB. Once seated at EOB, able to sit with supervision  Transfers Overall transfer level: Needs assistance Equipment used: 1 person hand held assist Transfers: Sit to/from Stand Sit to Stand: Max assist         General transfer comment: unable to use RW secondary to swelling and RA in hands-worse than baseline. Able to use 1  person max assist to stand, however due to severe pain, unable to further perform mobility  Ambulation/Gait             General Gait Details: unable to ambulate  Stairs            Wheelchair Mobility    Modified Rankin (Stroke Patients Only)       Balance Overall balance assessment: Needs assistance;History of Falls Sitting-balance support: Feet supported Sitting balance-Leahy Scale: Poor     Standing balance support: No upper extremity supported Standing balance-Leahy Scale: Poor                               Pertinent Vitals/Pain Pain Assessment: Faces Faces Pain Scale: Hurts whole lot Pain Location: B hips Pain Descriptors / Indicators: Aching Pain Intervention(s): Limited activity within patient's tolerance;Repositioned    Home Living Family/patient expects to be discharged to:: Private residence Living Arrangements: Spouse/significant other Available Help at Discharge: Family;Available 24 hours/day Type of Home: House Home Access: Ramped entrance     Home Layout: One level Home Equipment: Walker - 4 wheels;Cane - single point;Walker - 2 wheels;Bedside commode(lift chair)      Prior Function Level of Independence: Needs assistance   Gait / Transfers Assistance Needed: has only been able to SPT to [facility]. Husband able to transfer her to rollater/WC for moving around home. Limited ambulation noted since fall     Comments: Pt states husband provides assistance for help with ADLs  Hand Dominance        Extremity/Trunk Assessment   Upper Extremity Assessment Upper Extremity Assessment: Generalized weakness(B UE grossly 3/5; severe RA on B hands with swelling)    Lower Extremity Assessment Lower Extremity Assessment: Generalized weakness(B LE grossly 3/5 and limited by pain)       Communication   Communication: No difficulties  Cognition Arousal/Alertness: Awake/alert Behavior During Therapy: WFL for tasks  assessed/performed Overall Cognitive Status: Impaired/Different from baseline                                        General Comments      Exercises Other Exercises Other Exercises: Sat EOB to work on balance and positioning for pain relief. Also educated on positioning in bed for pain relief self help principles.   Assessment/Plan    PT Assessment Patient needs continued PT services  PT Problem List Decreased strength;Decreased activity tolerance;Decreased balance;Decreased mobility;Decreased cognition;Pain       PT Treatment Interventions DME instruction;Gait training;Therapeutic exercise;Balance training    PT Goals (Current goals can be found in the Care Plan section)  Acute Rehab PT Goals Patient Stated Goal: to try therapy and walk again PT Goal Formulation: With patient Time For Goal Achievement: 12/03/17 Potential to Achieve Goals: Fair    Frequency 7X/week   Barriers to discharge Inaccessible home environment;Decreased caregiver support      Co-evaluation               AM-PAC PT "6 Clicks" Daily Activity  Outcome Measure Difficulty turning over in bed (including adjusting bedclothes, sheets and blankets)?: Unable Difficulty moving from lying on back to sitting on the side of the bed? : Unable Difficulty sitting down on and standing up from a chair with arms (e.g., wheelchair, bedside commode, etc,.)?: Unable Help needed moving to and from a bed to chair (including a wheelchair)?: Total Help needed walking in hospital room?: Total Help needed climbing 3-5 steps with a railing? : Total 6 Click Score: 6    End of Session   Activity Tolerance: Patient limited by pain Patient left: in bed Nurse Communication: Mobility status PT Visit Diagnosis: Muscle weakness (generalized) (M62.81);Repeated falls (R29.6);Difficulty in walking, not elsewhere classified (R26.2);Pain Pain - Right/Left: Right Pain - part of body: Hip    Time:  9604-5409 PT Time Calculation (min) (ACUTE ONLY): 38 min   Charges:   PT Evaluation $PT Eval Low Complexity: 1 Low PT Treatments $Therapeutic Activity: 8-22 mins   PT G CodesElizabeth Palau, PT, DPT [phone]   Cass Edinger 11/19/2017, 3:47 PM

## 2017-11-19 NOTE — ED Notes (Signed)
Left arm swelling much improved, pt states it feels better

## 2017-11-19 NOTE — ED Notes (Signed)
PT consult at bedside

## 2017-11-19 NOTE — ED Notes (Signed)
Pt turned to L side to prevent pressure ulcers from forming. Pt's daughter remains at bedside at this time. Pt's daughter denies any further needs at this time. Pt resting in bed with eyes closed, respirations even and unlabored, pt awakens with mild stimuli. Will continue to monitor for further patient needs.

## 2017-11-19 NOTE — ED Notes (Signed)
#  22 removed from L forearm. Site swollen and infiltrated. Elevated on pillows

## 2017-11-19 NOTE — Clinical Social Work Note (Addendum)
CSW received a consult for "[facility] ED." CSW staffed with RN Jinny Blossom. CSW met with patient and daughter at bedside. Patient sleeping soundly. Patient from home alone. Daughter agreeable to patient going to SNF for short term rehab. Patient has been to [facility] before and daughter would like her to discharge there if available. CSW provided daughter with a SNF list. CSW explained that P/T will evaluate patient and that eval is needed to submit for insurance authorization. FL-2 completed and signed. CSW sent referral to [facility], who declined. CSW spoke with daughter to verify other facilities to refer. Daughter stated [facility] and [facility]. CSW sent referral and awaiting decisions. P/T evaluated patient and is recommending SNF for short term rehab.   3:46pm- Seth Bake at [facility] declined-no beds available. CSW spoke with Vaughan Basta at [facility] ([phone])-referral being reviewed. CSW continuing to follow for discharge needs.   Michelle Cabrera, Latanya Presser, [place] Social Worker-ED [phone]

## 2017-11-19 NOTE — ED Notes (Signed)
Placed on regular Hospital beds and ADLs completed. Pt feels better. Still having pain. She is aware regular scheduled medications due soon with MS contin

## 2017-11-20 MED ORDER — HALOPERIDOL LACTATE 5 MG/ML IJ SOLN
5.0000 mg | Freq: Once | INTRAMUSCULAR | Status: AC
  Administered 2017-11-20: 5 mg via INTRAVENOUS
  Filled 2017-11-20: qty 1

## 2017-11-20 NOTE — ED Notes (Signed)
PT helped to reposition after using bedpan and having large bowel movement

## 2017-11-20 NOTE — Clinical Social Work Note (Signed)
[facility], [facility], and [facility] declined patient. CSW met with patient and patient's spouse-Thomas Mota at bedside to update. Patient and spouse agreeable to [facility] and facility has made a bed offer. CSW spoke with Caroline ([phone]) in admissions at [facility] to confirm bed offer. Caroline to submit for insurance auth. CSW awaiting feedback.   2:54pm - CSW received information from Caroline that patient's insurance is under [employer] and CSW would need to initiate authorization. CSW called [employer] ([phone]) and spoke with Rachel who stated patient is under [employer] insurance and to call [phone]. CSW called this number and it states it a [employer] number. CSW continuing to clarify which insurance company patient is under. CSW submitted PASRR-manual review. 30 day note signed. CSW continuing to follow for discharge needs.    , LCSWA, LCASA Clinical Social Worker-ED [phone]    

## 2017-11-20 NOTE — ED Provider Notes (Addendum)
-----------------------------------------   7:21 AM on 11/20/2017 -----------------------------------------   Blood pressure 132/62, pulse 82, temperature 98 F (36.7 C), temperature source Oral, resp. rate 18, height 5\' 2"  (1.575 m), weight 51.7 kg (114 lb), SpO2 96 %.  The patient had no acute events since last update.  Calm and cooperative at this time.  Disposition is pending social work .     Scientist, forensic, MD 11/20/17 11/22/17    9373, MD 11/21/17 [phone]

## 2017-11-20 NOTE — ED Notes (Addendum)
Pt placed on bedpan and had BM. Pt cleaned and repositioned in bed.

## 2017-11-20 NOTE — Progress Notes (Signed)
Physical Therapy Treatment Patient Details Name: Michelle Cabrera MRN: [id number] DOB: February 03, 1946 Today's Date: 11/20/2017    History of Present Illness Pt admitted for R pubic rami fractures s/p fall on 4/11. Per family, pt hasn't ambulated much since the fall. Complained of dizziness prior to fall at home. Previous history of R hip fracture s/p IM nailing. Currently complains of increased pain on R LE > L LE    PT Comments    Pt is making good progress towards goals with increased ability to tolerate therapy this date. Appears motivated to make progress. Able to WB and stand on R LE as it is most painful. Able to use RW for assistance, however still unsafe to perform mobility independently. Still limited by pain, unable to ambulate household distances at this time. SNF still appropriate for dc. Will continue to progress.   Follow Up Recommendations  SNF     Equipment Recommendations  None recommended by PT    Recommendations for Other Services       Precautions / Restrictions Precautions Precautions: Fall Restrictions Weight Bearing Restrictions: No    Mobility  Bed Mobility Overal bed mobility: Needs Assistance Bed Mobility: Supine to Sit     Supine to sit: Min assist     General bed mobility comments: improved bed mobility this date with less assist required. Once seated on EOB, able to sit with upright posture. Pain is severe when moving and then calms down with resting.  Transfers Overall transfer level: Needs assistance Equipment used: Rolling walker (2 wheeled) Transfers: Sit to/from Stand Sit to Stand: Mod assist         General transfer comment: able to use RW for assistance this date. Mod assist required for standing and maintaining balance. Only able to stand for approx 2 minutes secondary to severe pain.  Ambulation/Gait Ambulation/Gait assistance: Mod assist Ambulation Distance (Feet): 2 Feet Assistive device: Rolling walker (2 wheeled) Gait  Pattern/deviations: Step-to pattern     General Gait Details: able to slide B feet across floor to take small steps up side of bed for better positioning for bed mobility. Unable to further ambulate.   Stairs             Wheelchair Mobility    Modified Rankin (Stroke Patients Only)       Balance                                            Cognition Arousal/Alertness: Awake/alert Behavior During Therapy: WFL for tasks assessed/performed Overall Cognitive Status: Within Functional Limits for tasks assessed                                        Exercises Other Exercises Other Exercises: supine ther-ex performed on B LE including alt. marching, ankle pumps, quad sets, glut sets, hip add squeezes, and SAQ, All ther-ex performed x 10 reps with min assist. Educated on performing while resting in bed.    General Comments        Pertinent Vitals/Pain Pain Assessment: 0-10 Pain Score: 8  Pain Location: R hip Pain Descriptors / Indicators: Aching Pain Intervention(s): Limited activity within patient's tolerance(decreases to 4/10 with rest)    Home Living  Prior Function            PT Goals (current goals can now be found in the care plan section) Acute Rehab PT Goals Patient Stated Goal: to try therapy and walk again PT Goal Formulation: With patient Time For Goal Achievement: 12/03/17 Potential to Achieve Goals: Fair Progress towards PT goals: Progressing toward goals    Frequency    7X/week      PT Plan Current plan remains appropriate    Co-evaluation              AM-PAC PT "6 Clicks" Daily Activity  Outcome Measure  Difficulty turning over in bed (including adjusting bedclothes, sheets and blankets)?: Unable Difficulty moving from lying on back to sitting on the side of the bed? : Unable Difficulty sitting down on and standing up from a chair with arms (e.g., wheelchair, bedside  commode, etc,.)?: Unable Help needed moving to and from a bed to chair (including a wheelchair)?: A Lot Help needed walking in hospital room?: A Lot Help needed climbing 3-5 steps with a railing? : Total 6 Click Score: 8    End of Session Equipment Utilized During Treatment: Gait belt Activity Tolerance: Patient limited by pain Patient left: in bed Nurse Communication: Mobility status PT Visit Diagnosis: Muscle weakness (generalized) (M62.81);Repeated falls (R29.6);Difficulty in walking, not elsewhere classified (R26.2);Pain Pain - Right/Left: Right Pain - part of body: Hip     Time: 3825-0539 PT Time Calculation (min) (ACUTE ONLY): 25 min  Charges:  $Therapeutic Exercise: 23-37 mins                    G Codes:       Elizabeth Palau, PT, DPT [phone]    Michelle Cabrera 11/20/2017, 1:50 PM

## 2017-11-20 NOTE — ED Notes (Signed)
Daughter called for update

## 2017-11-20 NOTE — ED Notes (Signed)
Pt provided with breakfast tray and beverage

## 2017-11-21 LAB — URINE CULTURE

## 2017-11-21 MED ORDER — SULFAMETHOXAZOLE-TRIMETHOPRIM 800-160 MG PO TABS: 1.0000 | ORAL_TABLET | Freq: Two times a day (BID) | ORAL | 0 refills | Status: DC

## 2017-11-21 MED ORDER — CEPHALEXIN 250 MG PO CAPS
250.0000 mg | ORAL_CAPSULE | Freq: Four times a day (QID) | ORAL | Status: DC
  Administered 2017-11-21: 250 mg via ORAL
  Filled 2017-11-21 (×2): qty 1

## 2017-11-21 NOTE — ED Provider Notes (Signed)
-----------------------------------------   1:51 AM on 11/21/2017 -----------------------------------------   Blood pressure 112/63, pulse 98, temperature 98 F (36.7 C), temperature source Oral, resp. rate 18, height 5\' 2"  (1.575 m), weight 51.7 kg (114 lb), SpO2 95 %.  The patient had no acute events since last update.  Calm and cooperative at this time.  Disposition is pending SW eval.   , MD 11/21/17 [phone]

## 2017-11-21 NOTE — ED Provider Notes (Signed)
Patient is pending nursing home placement, wounds on her lower extremities were redressed here and do not show signs of infection.  She is being treated for UTI.   Emily Filbert, MD 11/21/17 1128

## 2017-11-21 NOTE — NC FL2 (Deleted)
[place] MEDICAID FL2 LEVEL OF CARE SCREENING TOOL     IDENTIFICATION  Patient Name: Michelle Cabrera Birthdate: 11-17-1945 Sex: female Admission Date (Current Location): 11/18/2017  [place] and [place] Number:  Chiropodist and Address:  [facility], [address] [place], [place] [address]      Provider Number: [id number]  Attending Physician Name and Address:  No att. providers found  Relative Name and Phone Number:  Daughter Trey Paula [phone](m) [phone](h)    Current Level of Care: Hospital Recommended Level of Care: Skilled Nursing Facility Prior Approval Number:    Date Approved/Denied:   PASRR Number:  [id number]  Discharge Plan: SNF    Current Diagnoses: Patient Active Problem List   Diagnosis Date Noted  . Subacute delirium 05/13/2017  . Chronic prescription opiate use 05/13/2017  . Constipation 05/13/2017  . UTI (urinary tract infection) 05/09/2017  . Tachycardia 03/13/2017  . Protein calorie malnutrition (HCC) 03/13/2017  . Closed fracture of right olecranon process with routine healing 03/13/2017  . Hip fracture (HCC) 02/18/2017  . Bilateral carotid artery stenosis 12/10/2016  . Subclavian arterial stenosis (HCC) 12/10/2016  . Thoracic compression fracture (HCC) 02/04/2016  . UTI (lower urinary tract infection) 02/04/2016  . Schizophrenia spectrum disorder with psychotic disorder type not yet determined (HCC) 11/13/2015  . Syncope 11/10/2015  . Hypotension 11/10/2015  . 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 Pressure ulcer 09/08/2015  . New onset type 2 diabetes mellitus (HCC) 09/06/2015  . Altered mental status 09/06/2015  . COPD exacerbation (HCC) 07/26/2015  . Arthritis associated with another disorder 09/15/2014  . Wedge compression fracture of T6 vertebra (HCC) 02/07/2014  . Intractable low back pain 12/27/2012  . DDD (degenerative disc disease) 12/27/2012  . Rheumatoid arthritis (HCC) 12/27/2012   . Hypertension 12/27/2012    Orientation RESPIRATION BLADDER Height & Weight     Self, Place, Situation  Normal Incontinent Weight: 114 lb (51.7 kg) Height:  5\' 2"  (157.5 cm)  BEHAVIORAL SYMPTOMS/MOOD NEUROLOGICAL BOWEL NUTRITION STATUS      Continent Diet(Normal Diet)  AMBULATORY STATUS COMMUNICATION OF NEEDS Skin   Extensive Assist Verbally Normal                       Personal Care Assistance Level of Assistance  Bathing, Feeding, Dressing Bathing Assistance: Maximum assistance Feeding assistance: Limited assistance Dressing Assistance: Maximum assistance     Functional Limitations Info  Sight, Hearing, Speech Sight Info: Adequate Hearing Info: Adequate Speech Info: Adequate    SPECIAL CARE FACTORS FREQUENCY  PT (By licensed PT)     PT Frequency: 5x              Contractures Contractures Info: Not present    Additional Factors Info  Code Status, Allergies, Psychotropic Code Status Info: Full Allergies Info: Ambien Zolpidem Tartrate, Dilaudid Hydromorphone Hcl, Gold-containing Drug Products, Adhesive Tape, Haldol Haloperidol Psychotropic Info: See MAR         Current Medications (11/21/2017):  This is the current hospital active medication list Current Facility-Administered Medications  Medication Dose Route Frequency Provider Last Rate Last Dose  . albuterol (PROVENTIL) (2.5 MG/3ML) 0.083% nebulizer solution 2.5 mg  2.5 mg Inhalation Q6H PRN 11/23/2017, Don Perking, MD      . cephALEXin (KEFLEX) capsule 250 mg  250 mg Oral Q6H Washington, MD      . DULoxetine (CYMBALTA) DR capsule 30 mg  30 mg Oral QHS Emily Filbert, Don Perking, MD   30 mg  at 11/20/17 2152  . DULoxetine (CYMBALTA) DR capsule 60 mg  60 mg Oral Leeanne Deed, [place], MD   60 mg at 11/21/17 0734  . folic acid (FOLVITE) tablet 1 mg  1 mg Oral Daily Don Perking, [place], MD   1 mg at 11/20/17 0940  . HYDROcodone-acetaminophen (NORCO/VICODIN) 5-325 MG per tablet 1 tablet  1 tablet Oral Q6H  PRN Nita Sickle, MD   1 tablet at 11/20/17 1339  . morphine (MS CONTIN) 12 hr tablet 15 mg  15 mg Oral Q12H Don Perking, [place], MD   15 mg at 11/20/17 2152  . pantoprazole (PROTONIX) EC tablet 40 mg  40 mg Oral Daily Don Perking, [place], MD   40 mg at 11/21/17 0734  . potassium chloride SA (K-DUR,KLOR-CON) CR tablet 20 mEq  20 mEq Oral Daily Don Perking, [place], MD   20 mEq at 11/20/17 0940  . QUEtiapine (SEROQUEL) tablet 50 mg  50 mg Oral QHS Don Perking, [place], MD   50 mg at 11/20/17 2151  . vitamin A capsule 20,000 Units  20,000 Units Oral Daily Nita Sickle, MD   20,000 Units at 11/20/17 0940   Current Outpatient Medications  Medication Sig Dispense Refill  . albuterol (PROVENTIL HFA;VENTOLIN HFA) 108 (90 Base) MCG/ACT inhaler Inhale 2 puffs into the lungs every 6 (six) hours as needed for wheezing or shortness of breath.    . cephALEXin (KEFLEX) 250 MG capsule Take 250 mg by mouth at bedtime.    . DULoxetine (CYMBALTA) 30 MG capsule Take 30 mg by mouth at bedtime.    . DULoxetine (CYMBALTA) 60 MG capsule Take 60 mg by mouth every morning.    . folic acid (FOLVITE) 1 MG tablet Take 1 mg by mouth daily.    Marland Kitchen HYDROcodone-acetaminophen (NORCO) 5-325 MG tablet Take 1 tablet by mouth every 6 (six) hours as needed for moderate pain. 30 tablet 0  . lamoTRIgine (LAMICTAL) 25 MG tablet Take 50 mg by mouth 2 (two) times daily.     Marland Kitchen morphine (MS CONTIN) 15 MG 12 hr tablet Take 1 tablet (15 mg total) by mouth every 12 (twelve) hours. 20 tablet 0  . omeprazole (PRILOSEC) 20 MG capsule Take 20 mg by mouth 2 (two) times daily.     . QUEtiapine (SEROQUEL) 25 MG tablet Take 1 tablet (25 mg total) by mouth at bedtime. (Patient taking differently: Take 50 mg by mouth at bedtime. ) 30 tablet 0  . Sarilumab (KEVZARA) 200 MG/1. SOAJ Inject 200 mg into the skin every 14 (fourteen) days.    . vitamin A 72536 UNIT capsule Take 25,000 Units by mouth daily.    . bisacodyl (DULCOLAX) 10 MG suppository  Place 1 suppository (10 mg total) rectally daily as needed for moderate constipation. (Patient not taking: Reported on 11/18/2017) 12 suppository 0  . cephALEXin (KEFLEX) 250 MG capsule Take one capsule after intercourse (Patient not taking: Reported on 11/18/2017) 90 capsule 0  . conjugated estrogens (PREMARIN) vaginal cream Apply 0.5mg  (pea-sized amount)  just inside the vaginal introitus with a finger-tip Monday, Wednesday and Friday nights. (Patient not taking: Reported on 11/18/2017) 30 g 12  . estradiol (ESTRACE VAGINAL) 0.1 MG/GM vaginal cream Apply 0.5mg  (pea-sized amount)  just inside the vaginal introitus with a finger-tip every night for two weeks and then Monday, Wednesday and Friday nights. (Patient not taking: Reported on 11/18/2017) 30 g 12  . ferrous sulfate 325 (65 FE) MG tablet Take 1 tablet (325 mg total) by mouth 2 (two) times daily with  a meal. (Patient not taking: Reported on 11/18/2017) 60 tablet 3  . megestrol (MEGACE) 40 MG tablet Take 1 tablet (40 mg total) by mouth daily. (Patient not taking: Reported on 11/18/2017) 30 tablet 0  . OLANZapine (ZYPREXA) 5 MG tablet Take 1 tablet (5 mg total) by mouth at bedtime. (Patient not taking: Reported on 11/18/2017) 30 tablet 0  . potassium chloride 20 MEQ TBCR Take 20 mEq by mouth daily for 7 days. 10 tablet 0  . predniSONE (DELTASONE) 10 MG tablet Take 2 tabs daily for 3 days; take one tab daily for 3 days; take 1/2 tab daily for 4 days (Patient not taking: Reported on 11/18/2017) 11 tablet 0     Discharge Medications: Please see discharge summary for a list of discharge medications.  Relevant Imaging Results:  Relevant Lab Results:   Additional Information SSN: [id number]  Cheron Schaumann, [place]

## 2017-11-21 NOTE — Progress Notes (Signed)
LCSW received a call from daughter and she has run an errand. Received a call from Renay from [place]- she has requested a few more notes and information. This worker complied awaiting authorization.  [employer] LCSW [phone]

## 2017-11-21 NOTE — Progress Notes (Signed)
Received authorization from [facility] [phone]  Texted information to [place] at [place] and followed up call.  Patient has been given a room number of 110 Call report number [phone] ask for 100 nurse [place].  Spoke to EDP and EDRN and patients daughter will transfer her to facility.  LCSW called daughter Trey Paula [phone] and left a detailed message.  [employer] LCSW [phone]

## 2017-11-21 NOTE — Progress Notes (Signed)
LCSW called and completed all documentation for patient and refaxed it to [facility] and then again to [facility] area. LCSW expedited this request and has repeated myself 2 times to [facility] health to ensure they received all documentation for this authorization.  LCSW informed [facility]/Patients daughter. Once authorization given patient to be transported by family not EMS.  Katriona Schmierer LCSW

## 2017-11-21 NOTE — ED Provider Notes (Signed)
Vitals:   11/21/17 0704 11/21/17 1503  BP: (!) 147/89 122/85  Pulse: 81 79  Resp: 18 16  Temp:  98.3 F (36.8 C)  SpO2: 98% 96%   ----------------------------------------- 3:27 PM on 11/21/2017 -----------------------------------------  Urine culture reviewed, sensitive to Bactrim and Cipro.  Patient on QT prolonging meds, will avoid Cipro.  Discussed and updated patient family including daughter at the bedside.  Patient and daughter both comfortable with plan to go to [facility], understanding of plan, treatment for UTI and rehabilitation there.  Admissions director from [facility] currently at the bedside, reports they will be ready to depart about 10 minutes time.  Patient resting comfortably in no distress.  Will discharge at this time, daughter comfortable with and request to drive her to the [facility] herself.  Patient appears stable for discharge.     Sharyn Creamer, MD 11/21/17 [phone]

## 2017-11-21 NOTE — ED Notes (Signed)
Attempted to call report to [facility] [phone] to notify of new patient, called x 3 with no answer.  Pt's daughter aware of discharge instructions, expresses verbal understanding with no further questions at this time.

## 2017-11-21 NOTE — NC FL2 (Signed)
[place] MEDICAID FL2 LEVEL OF CARE SCREENING TOOL     IDENTIFICATION  Patient Name: Michelle Cabrera Birthdate: 1946/07/05 Sex: female Admission Date (Current Location): 11/18/2017  [place] and [place] Number:  Chiropodist and Address:  [facility], [address], [place], [place] [address]      Provider Number: [id number]  Attending Physician Name and Address:  No att. providers found  Relative Name and Phone Number:  Daughter Trey Paula [phone](m) [phone](h)    Current Level of Care: Hospital Recommended Level of Care: Skilled Nursing Facility Prior Approval Number:    Date Approved/Denied:   PASRR Number:    Discharge Plan: SNF    Current Diagnoses: Patient Active Problem List   Diagnosis Date Noted  . Subacute delirium 05/13/2017  . Chronic prescription opiate use 05/13/2017  . Constipation 05/13/2017  . UTI (urinary tract infection) 05/09/2017  . Tachycardia 03/13/2017  . Protein calorie malnutrition (HCC) 03/13/2017  . Closed fracture of right olecranon process with routine healing 03/13/2017  . Hip fracture (HCC) 02/18/2017  . Bilateral carotid artery stenosis 12/10/2016  . Subclavian arterial stenosis (HCC) 12/10/2016  . Thoracic compression fracture (HCC) 02/04/2016  . UTI (lower urinary tract infection) 02/04/2016  . Schizophrenia spectrum disorder with psychotic disorder type not yet determined (HCC) 11/13/2015  . Syncope 11/10/2015  . Hypotension 11/10/2015  . 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 Pressure ulcer 09/08/2015  . New onset type 2 diabetes mellitus (HCC) 09/06/2015  . Altered mental status 09/06/2015  . COPD exacerbation (HCC) 07/26/2015  . Arthritis associated with another disorder 09/15/2014  . Wedge compression fracture of T6 vertebra (HCC) 02/07/2014  . Intractable low back pain 12/27/2012  . DDD (degenerative disc disease) 12/27/2012  . Rheumatoid arthritis (HCC) 12/27/2012  .  Hypertension 12/27/2012    Orientation RESPIRATION BLADDER Height & Weight     Self, Place, Situation  Normal Incontinent Weight: 114 lb (51.7 kg) Height:  5\' 2"  (157.5 cm)  BEHAVIORAL SYMPTOMS/MOOD NEUROLOGICAL BOWEL NUTRITION STATUS      Continent Diet(Normal Diet)  AMBULATORY STATUS COMMUNICATION OF NEEDS Skin   Extensive Assist Verbally Normal                       Personal Care Assistance Level of Assistance  Bathing, Feeding, Dressing Bathing Assistance: Maximum assistance Feeding assistance: Limited assistance Dressing Assistance: Maximum assistance     Functional Limitations Info  Sight, Hearing, Speech Sight Info: Adequate Hearing Info: Adequate Speech Info: Adequate    SPECIAL CARE FACTORS FREQUENCY  PT (By licensed PT)     PT Frequency: 5x              Contractures Contractures Info: Not present    Additional Factors Info  Code Status, Allergies, Psychotropic Code Status Info: Full Allergies Info: Ambien Zolpidem Tartrate, Dilaudid Hydromorphone Hcl, Gold-containing Drug Products, Adhesive Tape, Haldol Haloperidol Psychotropic Info: See MAR         Current Medications (11/21/2017):  This is the current hospital active medication list Current Facility-Administered Medications  Medication Dose Route Frequency Provider Last Rate Last Dose  . albuterol (PROVENTIL) (2.5 MG/3ML) 0.083% nebulizer solution 2.5 mg  2.5 mg Inhalation Q6H PRN 11/23/2017, Don Perking, MD      . cephALEXin (KEFLEX) capsule 250 mg  250 mg Oral Q6H Washington, MD   250 mg at 11/21/17 1224  . DULoxetine (CYMBALTA) DR capsule 30 mg  30 mg Oral QHS 11/23/17, MD  30 mg at 11/20/17 2152  . DULoxetine (CYMBALTA) DR capsule 60 mg  60 mg Oral Leeanne Deed, [place], MD   60 mg at 11/21/17 0734  . folic acid (FOLVITE) tablet 1 mg  1 mg Oral Daily Don Perking, [place], MD   1 mg at 11/21/17 1009  . HYDROcodone-acetaminophen (NORCO/VICODIN) 5-325 MG per tablet 1 tablet   1 tablet Oral Q6H PRN Nita Sickle, MD   1 tablet at 11/20/17 1339  . morphine (MS CONTIN) 12 hr tablet 15 mg  15 mg Oral Q12H Don Perking, [place], MD   15 mg at 11/21/17 1009  . pantoprazole (PROTONIX) EC tablet 40 mg  40 mg Oral Daily Don Perking, [place], MD   40 mg at 11/21/17 0734  . potassium chloride SA (K-DUR,KLOR-CON) CR tablet 20 mEq  20 mEq Oral Daily Don Perking, [place], MD   20 mEq at 11/21/17 1009  . QUEtiapine (SEROQUEL) tablet 50 mg  50 mg Oral QHS Don Perking, [place], MD   50 mg at 11/20/17 2151  . vitamin A capsule 20,000 Units  20,000 Units Oral Daily Nita Sickle, MD   20,000 Units at 11/21/17 1329   Current Outpatient Medications  Medication Sig Dispense Refill  . albuterol (PROVENTIL HFA;VENTOLIN HFA) 108 (90 Base) MCG/ACT inhaler Inhale 2 puffs into the lungs every 6 (six) hours as needed for wheezing or shortness of breath.    . cephALEXin (KEFLEX) 250 MG capsule Take 250 mg by mouth at bedtime.    . DULoxetine (CYMBALTA) 30 MG capsule Take 30 mg by mouth at bedtime.    . DULoxetine (CYMBALTA) 60 MG capsule Take 60 mg by mouth every morning.    . folic acid (FOLVITE) 1 MG tablet Take 1 mg by mouth daily.    Marland Kitchen HYDROcodone-acetaminophen (NORCO) 5-325 MG tablet Take 1 tablet by mouth every 6 (six) hours as needed for moderate pain. 30 tablet 0  . lamoTRIgine (LAMICTAL) 25 MG tablet Take 50 mg by mouth 2 (two) times daily.     Marland Kitchen morphine (MS CONTIN) 15 MG 12 hr tablet Take 1 tablet (15 mg total) by mouth every 12 (twelve) hours. 20 tablet 0  . omeprazole (PRILOSEC) 20 MG capsule Take 20 mg by mouth 2 (two) times daily.     . QUEtiapine (SEROQUEL) 25 MG tablet Take 1 tablet (25 mg total) by mouth at bedtime. (Patient taking differently: Take 50 mg by mouth at bedtime. ) 30 tablet 0  . Sarilumab (KEVZARA) 200 MG/1. SOAJ Inject 200 mg into the skin every 14 (fourteen) days.    . vitamin A 94854 UNIT capsule Take 25,000 Units by mouth daily.    . bisacodyl (DULCOLAX)  10 MG suppository Place 1 suppository (10 mg total) rectally daily as needed for moderate constipation. (Patient not taking: Reported on 11/18/2017) 12 suppository 0  . cephALEXin (KEFLEX) 250 MG capsule Take one capsule after intercourse (Patient not taking: Reported on 11/18/2017) 90 capsule 0  . conjugated estrogens (PREMARIN) vaginal cream Apply 0.5mg  (pea-sized amount)  just inside the vaginal introitus with a finger-tip Monday, Wednesday and Friday nights. (Patient not taking: Reported on 11/18/2017) 30 g 12  . estradiol (ESTRACE VAGINAL) 0.1 MG/GM vaginal cream Apply 0.5mg  (pea-sized amount)  just inside the vaginal introitus with a finger-tip every night for two weeks and then Monday, Wednesday and Friday nights. (Patient not taking: Reported on 11/18/2017) 30 g 12  . ferrous sulfate 325 (65 FE) MG tablet Take 1 tablet (325 mg total) by mouth 2 (two) times  daily with a meal. (Patient not taking: Reported on 11/18/2017) 60 tablet 3  . megestrol (MEGACE) 40 MG tablet Take 1 tablet (40 mg total) by mouth daily. (Patient not taking: Reported on 11/18/2017) 30 tablet 0  . OLANZapine (ZYPREXA) 5 MG tablet Take 1 tablet (5 mg total) by mouth at bedtime. (Patient not taking: Reported on 11/18/2017) 30 tablet 0  . potassium chloride 20 MEQ TBCR Take 20 mEq by mouth daily for 7 days. 10 tablet 0  . predniSONE (DELTASONE) 10 MG tablet Take 2 tabs daily for 3 days; take one tab daily for 3 days; take 1/2 tab daily for 4 days (Patient not taking: Reported on 11/18/2017) 11 tablet 0     Discharge Medications: Please see discharge summary for a list of discharge medications.  Relevant Imaging Results:  Relevant Lab Results:   Additional Information SSN: [id number]  Cheron Schaumann, [place]

## 2017-11-21 NOTE — Progress Notes (Signed)
Physical Therapy Treatment Patient Details Name: Michelle Cabrera MRN: [id number] DOB: [birth year] Today's Date: 11/21/2017    History of Present Illness Pt admitted for R pubic rami fractures s/p fall on 4/11. Per family, pt hasn't ambulated much since the fall. Complained of dizziness prior to fall at home. Previous history of R hip fracture s/p IM nailing. Currently complains of increased pain on R LE > L LE    PT Comments    Pt is making good progress and seems motivated to participate. Somewhat confused this date talking about cats and parties in the hallway. Unable to work TV remote, therapist provided assistance. Cognition seemed to wax/wane during time therapist in room, seems to remember performing therapy yesterday. Good endurance with there-ex and agreeable to sit EOB. Hesitant to stand, however tolerated well, very fearful of falling. Able to take a few steps at bedside today, still requires assist for all mobility.   Follow Up Recommendations  SNF     Equipment Recommendations  None recommended by PT    Recommendations for Other Services       Precautions / Restrictions Precautions Precautions: Fall Restrictions Weight Bearing Restrictions: No    Mobility  Bed Mobility Overal bed mobility: Needs Assistance Bed Mobility: Supine to Sit     Supine to sit: Min assist     General bed mobility comments: improved sequencing this date without cues. ONly required assist for moving B LE off bed. Once seated, reports little to no pain.  Transfers Overall transfer level: Needs assistance Equipment used: Rolling walker (2 wheeled) Transfers: Sit to/from Stand Sit to Stand: Min assist         General transfer comment: needs decreased assist this date for transfers. Once standing able to WB on R LE.  Ambulation/Gait Ambulation/Gait assistance: Min assist Ambulation Distance (Feet): 2 Feet Assistive device: Rolling walker (2 wheeled) Gait Pattern/deviations: Step-to  pattern     General Gait Details: able to take side step up towards HOB. LImited WBing on R LE, unwilling to ambulate away from bed secondary to fear of falling   Stairs             Wheelchair Mobility    Modified Rankin (Stroke Patients Only)       Balance                                            Cognition Arousal/Alertness: Awake/alert Behavior During Therapy: WFL for tasks assessed/performed Overall Cognitive Status: Within Functional Limits for tasks assessed                                        Exercises Other Exercises Other Exercises: seated ther-ex performed including alt. LE marching, LAQ, hip add squeezes, weight shifting activities outside BOS. ALl ther-ex performed x 15 reps.    General Comments        Pertinent Vitals/Pain Pain Assessment: Faces Faces Pain Scale: Hurts little more Pain Location: R hip Pain Descriptors / Indicators: Aching Pain Intervention(s): Limited activity within patient's tolerance    Home Living                      Prior Function            PT Goals (current goals can  now be found in the care plan section) Acute Rehab PT Goals Patient Stated Goal: to try therapy and walk again PT Goal Formulation: With patient Time For Goal Achievement: 12/03/17 Potential to Achieve Goals: Fair Progress towards PT goals: Progressing toward goals    Frequency    7X/week      PT Plan Current plan remains appropriate    Co-evaluation              AM-PAC PT "6 Clicks" Daily Activity  Outcome Measure  Difficulty turning over in bed (including adjusting bedclothes, sheets and blankets)?: Unable Difficulty moving from lying on back to sitting on the side of the bed? : Unable Difficulty sitting down on and standing up from a chair with arms (e.g., wheelchair, bedside commode, etc,.)?: Unable Help needed moving to and from a bed to chair (including a wheelchair)?: A Lot Help  needed walking in hospital room?: A Lot Help needed climbing 3-5 steps with a railing? : Total 6 Click Score: 8    End of Session Equipment Utilized During Treatment: Gait belt Activity Tolerance: Patient limited by pain Patient left: in bed;with bed alarm set Nurse Communication: Mobility status PT Visit Diagnosis: Muscle weakness (generalized) (M62.81);Repeated falls (R29.6);Difficulty in walking, not elsewhere classified (R26.2);Pain Pain - Right/Left: Right Pain - part of body: Hip     Time: 8675-4492 PT Time Calculation (min) (ACUTE ONLY): 23 min  Charges:  $Gait Training: 8-22 mins $Therapeutic Exercise: 8-22 mins                    G Codes:       Elizabeth Palau, PT, DPT [phone]    Michelle Cabrera 11/21/2017, 2:53 PM

## 2017-11-21 NOTE — ED Notes (Signed)
Pt yelling in room. This RN and [place], MD to room. Pt concerned about cats and rats in room with her. Pt reassured that neither are present in room. Pt asking, "Who's that little boy who just shooed out my cats?" Pt repositioned and now resting comfortably.

## 2017-11-21 NOTE — ED Notes (Signed)
Pt oob to toilet with assistance.

## 2017-11-21 NOTE — ED Notes (Signed)
Daughter asked to have assistance in washing pt's hair. Daughter given basins/towels/pads and assisted with getting pt into comfortable position to wash her hair.

## 2017-11-21 NOTE — Progress Notes (Signed)
LCSW called Patient insurance numbers and spoke to [name]- LCSW will attempt to get authorization _for Michelle Jew  LCSW called [name] [facility] [name] and relayed we have passr number [phone] and this information was texted to facility this information placed on Fl2 and resent to [place]. LCSW requested her facility call to obtain observation  Awaiting a authorization from [facility] [phone] for facility.  [employer] LCSW [phone]

## 2017-11-21 NOTE — Progress Notes (Signed)
LCSW reviewed and collected patient notes and checked passr. LCSW will submit Fl2-30 day note and physician assessment to [employer] [place]. To obtain E number.  [employer] LCSW [phone]

## 2017-12-09 ENCOUNTER — Encounter (INDEPENDENT_AMBULATORY_CARE_PROVIDER_SITE_OTHER): Payer: Medicare Other

## 2017-12-09 ENCOUNTER — Ambulatory Visit (INDEPENDENT_AMBULATORY_CARE_PROVIDER_SITE_OTHER): Payer: Medicare Other | Admitting: Vascular Surgery

## 2017-12-15 ENCOUNTER — Other Ambulatory Visit: Payer: Self-pay

## 2017-12-15 ENCOUNTER — Telehealth: Payer: Self-pay | Admitting: Urology

## 2017-12-15 NOTE — Telephone Encounter (Signed)
Pt's daughter, Trey Paula, called to let us know pt went to [facility] last Tuessday with UTI.  She was septic.  Better now, discharged today, going to [facility] rehab for a few weeks.  Does she still need to be on low dose antibiotic that you prescribed?  Cephalexin 250 mg?  Please call pt back and let her know.  [phone]

## 2017-12-16 NOTE — Telephone Encounter (Signed)
Pt daughter when she called back, she states the pt is in the hospital, maybe going home today. She states pt is not currently on an abx, but she thinks the pt is showing signs of a UTI at this time. I advised her that if she is at the hospital and they place her on an abx, then she needs to avoid hers while being treated by the hospital. If she is not on an abx when released she should resume her suppression abx as instructed by Carollee Herter.

## 2017-12-23 NOTE — Telephone Encounter (Signed)
Called pt. No answer °

## 2017-12-23 NOTE — Telephone Encounter (Signed)
Is Mrs. Lorenzi still having symptoms of an UTI?

## 2017-12-30 NOTE — Telephone Encounter (Signed)
Called pt, no answer.

## 2018-01-08 NOTE — Progress Notes (Signed)
01/09/2018 10:17 AM   Erasmo Score 15-Jan-1946 [id number]  Referring provider: Kirk Ruths, MD [facility] [place], [place] [address]  Chief Complaint  Patient presents with  . Follow-up  . Recurrent UTI  . Vaginal Atrophy    HPI: 72 yo WF with a history of recurrent urinary tract infections and vaginal atrophy who presents today for follow up.  Recurrent UTI's Staphylococcus aureus with variable resistant pattern in June 17, 2017 Enterococcus faecalis with variable resistant pattern on July 10, 2017 Serratia marcescens with variable resistance pattern on November 18, 2017  Risk factors for UTI's:  Vaginal atrophy, little or no water intake and incontinence   Contrast CT performed in 05/14/2017 noted chronic left adrenal gland thickening is stable since 2016. Negative right adrenal gland. Small chronic left renal cyst. Bilateral renal enhancement and contrast excretion is within normal limits. No hydronephrosis or hydroureter.  Diminutive and unremarkable urinary bladder.   She underwent cystoscopy with Dr. Pilar Jarvis on 07/04/2017 and it was NED.    She was admitted to [facility] for E. Coli sepsis.  The daughter states that "none of the antibiotics would work on it."  I cannot see the urine culture or blood culture results in the chart.  I have a call into IT.  She was taken off her suppressive antibiotic, Keflex, and placed on Hiprex.    She does not have UTI symptoms at this time.  She has been increasing her water intake.  Patient denies any gross hematuria, dysuria or suprapubic/flank pain.  Patient denies any fevers, chills, nausea or vomiting.    Vaginal atrophy She is applying the vaginal estrogen cream three nights weekly.       PMH: Past Medical History:  Diagnosis Date  . Anxiety   . Arthritis   . Cataract   . Cervicalgia   . Chronic lower back pain   . Closed compression fracture of L1 lumbar vertebra  11/2012  . Collagen vascular disease ([place])   . COPD (chronic obstructive pulmonary disease) ([place])    unspecified  . DDD (degenerative disc disease)   . Depression   . Diabetes mellitus ([place])   . Disorder of bursae and tendons in shoulder region    unspecified  . Fall 09/27/2016   Fall Feb 04, 2016 with broken vertebra  . GERD (gastroesophageal reflux disease)   . Headache   . Hereditary and idiopathic peripheral neuropathy    unspecified  . History of adenomatous polyp of colon    Followed by Dr. Tiffany Kocher  . History of tubal ligation   . Hypertension   . Intermediate coronary syndrome ([place])   . Lumbago   . Neuralgia and neuritis, unspecified    radiculitis unspecified  . Rheumatoid arthritis ([place])   . Rheumatoid arthritis ([place])   . Senile osteoporosis   . Spinal stenosis, lumbar region without neurogenic claudication     Surgical History: Past Surgical History:  Procedure Laterality Date  . BACK SURGERY    . BREAST BIOPSY Left    negative  . CATARACT EXTRACTION W/ INTRAOCULAR LENS  IMPLANT, BILATERAL Bilateral   . COLONOSCOPY  08/17/2007   Adenomatous Polyps  . COLONOSCOPY  10/09/2010   PH Adenomatous polyps : [facility] 09/2015 ; Recall Ltr mailed 08/17/2015 (dw)  . ESOPHAGOGASTRODUODENOSCOPY  01/14/2008   No repeat per RTE  . FINGER ARTHROPLASTY Right 09/15/2014   Procedure: RIGHT MIDDLE FINGER, RING FINGER, SMALL FINGER EXTENSOR DIGITORUM COMMUMIS STABILIZATION  WITH RIGHT MIDDLE FINGER MCP ARTHROPLASTY, POSSIBLE RING FINGER AND SMALL FINGER MCP ARTHROPLASTY;  Surgeon: Roseanne Kaufman, MD;  Location: [place];  Service: Orthopedics;  Laterality: Right;  . FOOT SURGERY Bilateral   . HAND TENDON SURGERY Right 09/15/2014   "d/t RA"  . HARDWARE REMOVAL Right 04/16/2017   Procedure: HARDWARE REMOVAL;  Surgeon: Dereck Leep, MD;  Location: [facility];  Service: Orthopedics;  Laterality: Right;  . HUMERUS FRACTURE SURGERY Left   . INCISION AND DRAINAGE OF WOUND Right 04/16/2017   Procedure:  IRRIGATION AND DEBRIDEMENT WOUND;  Surgeon: Wallace Going, DO;  Location: [facility];  Service: Plastics;  Laterality: Right;  . INTRAMEDULLARY (IM) NAIL INTERTROCHANTERIC Right 02/19/2017   Procedure: INTRAMEDULLARY (IM) NAIL INTERTROCHANTRIC;  Surgeon: Dereck Leep, MD;  Location: [facility];  Service: Orthopedics;  Laterality: Right;  . KYPHOPLASTY N/A 01/04/2013   Procedure: L1 Kyphoplasty;  Surgeon: Hosie Spangle, MD;  Location: [place] [facility];  Service: Neurosurgery;  Laterality: N/A;  L1 Kyphoplasty  . KYPHOPLASTY N/A 02/07/2014   Procedure: THORACIC SIX KYPHOPLASTY;  Surgeon: Hosie Spangle, MD;  Location: [place] [facility];  Service: Neurosurgery;  Laterality: N/A;  T6 Kyphoplasty   . KYPHOPLASTY N/A 02/08/2016   Procedure: KYPHOPLASTY  T-12;  Surgeon: Hessie Knows, MD;  Location: [facility];  Service: Orthopedics;  Laterality: N/A;  . KYPHOPLASTY N/A 10/01/2016   Procedure: KYPHOPLASTY  L-5;  Surgeon: Hessie Knows, MD;  Location: [facility];  Service: Orthopedics;  Laterality: N/A;  . LUMBAR [place] [facility]  2014    X 3  . LUMBAR LAMINECTOMY    . ORIF ELBOW FRACTURE Right 02/19/2017   Procedure: OPEN REDUCTION INTERNAL FIXATION (ORIF) ELBOW/OLECRANON FRACTURE;  Surgeon: Dereck Leep, MD;  Location: [facility];  Service: Orthopedics;  Laterality: Right;  . PERIPHERAL VASCULAR CATHETERIZATION Left 12/07/2015   Procedure: Upper Extremity Angiography;  Surgeon: Algernon Huxley, MD;  Location: [place] [facility];  Service: Cardiovascular;  Laterality: Left;  . PERIPHERAL VASCULAR CATHETERIZATION  12/07/2015   Procedure: Upper Extremity Intervention;  Surgeon: Algernon Huxley, MD;  Location: [place] [facility];  Service: Cardiovascular;;  . POSTERIOR LUMBAR FUSION     "got screws in"  . TONSILLECTOMY    . TUBAL LIGATION    . WRIST FUSION Right    30 yrs ago    Home Medications:  Allergies as of 01/09/2018      Reactions   Ambien [zolpidem Tartrate] Other (See Comments)   Over sedation     Dilaudid [hydromorphone Hcl] Other (See Comments)   Made tongue swell   Gold-containing Drug Products Anaphylaxis   Adhesive [tape] Other (See Comments)   Tears skin   Haldol [haloperidol] Other (See Comments)   Hallucinations      Medication List        Accurate as of 01/09/18 10:17 AM. Always use your most recent med list.          BETA CAROTENE PROVITAMIN A 67619 UNIT capsule Generic drug:  beta carotene Take 25,000 Units by mouth daily.   conjugated estrogens vaginal cream Commonly known as:  PREMARIN Apply 0.26m (pea-sized amount)  just inside the vaginal introitus with a finger-tip Monday, Wednesday and Friday nights.   diclofenac sodium 1 % Gel Commonly known as:  VOLTAREN Apply 2 g topically 4 (four) times daily.   DULoxetine 60 MG capsule Commonly known as:  CYMBALTA Take 60 mg by mouth every morning.   estradiol 0.1 MG/GM vaginal cream  Commonly known as:  ESTRACE VAGINAL Apply 0.'5mg'$  (pea-sized amount)  just inside the vaginal introitus with a finger-tip every night for two weeks and then Monday, Wednesday and Friday nights.   folic acid 1 MG tablet Commonly known as:  FOLVITE Take 1 mg by mouth daily.   HYDROcodone-acetaminophen 5-325 MG tablet Commonly known as:  NORCO Take 1 tablet by mouth every 6 (six) hours as needed for moderate pain.   methenamine 1 g tablet Commonly known as:  MANDELAMINE TAKE 1 TABLET (1 G TOTAL) BY MOUTH TWO (2) TIMES A DAY.   morphine 15 MG 12 hr tablet Commonly known as:  MS CONTIN Take 1 tablet (15 mg total) by mouth every 12 (twelve) hours.   omeprazole 20 MG capsule Commonly known as:  PRILOSEC Take 20 mg by mouth 2 (two) times daily.       Allergies:  Allergies  Allergen Reactions  . Ambien [Zolpidem Tartrate] Other (See Comments)    Over sedation   . Dilaudid [Hydromorphone Hcl] Other (See Comments)    Made tongue swell  . Gold-Containing Drug Products Anaphylaxis  . Adhesive [Tape] Other (See Comments)     Tears skin  . Haldol [Haloperidol] Other (See Comments)    Hallucinations    Family History: Family History  Problem Relation Age of Onset  . CAD Father   . Alcohol abuse Father   . Heart disease Father   . CAD Mother   . Hypertension Mother   . Peripheral vascular disease Mother   . Osteoarthritis Mother   . Heart disease Mother   . Breast cancer Maternal Aunt 69  . Alcohol abuse Sister   . Arthritis Sister   . Cancer Sister   . Kidney cancer Neg Hx   . Kidney disease Neg Hx   . Prostate cancer Neg Hx     Social History:  reports that she quit smoking about 2 years ago. Her smoking use included cigarettes. She has a 56.00 pack-year smoking history. She has never used smokeless tobacco. She reports that she does not drink alcohol or use drugs.  ROS: UROLOGY Frequent Urination?: No Hard to postpone urination?: No Burning/pain with urination?: No Get up at night to urinate?: No Leakage of urine?: No Urine stream starts and stops?: No Trouble starting stream?: No Do you have to strain to urinate?: No Blood in urine?: No Urinary tract infection?: No Sexually transmitted disease?: No Injury to kidneys or bladder?: No Painful intercourse?: No Weak stream?: No Currently pregnant?: No Vaginal bleeding?: No Last menstrual period?: Postmenopausal  Gastrointestinal Nausea?: No Vomiting?: No Indigestion/heartburn?: No Diarrhea?: No Constipation?: No  Constitutional Fever: No Night sweats?: No Weight loss?: Yes Fatigue?: No  Skin Skin rash/lesions?: No Itching?: No  Eyes Blurred vision?: No Double vision?: No  Ears/Nose/Throat Sore throat?: No Sinus problems?: No  Hematologic/Lymphatic Swollen glands?: No Easy bruising?: Yes  Cardiovascular Leg swelling?: No Chest pain?: No  Respiratory Cough?: No Shortness of breath?: No  Endocrine Excessive thirst?: No  Musculoskeletal Back pain?: Yes Joint pain?: Yes  Neurological Headaches?:  No Dizziness?: Yes  Psychologic Depression?: Yes Anxiety?: No  Physical Exam: BP 136/79 (BP Location: Left Arm, Patient Position: Sitting, Cuff Size: Normal)   Pulse 96   Ht '5\' 2"'$  (1.575 m)   Wt 104 lb (47.2 kg)   BMI 19.02 kg/m   Constitutional: Well nourished. Alert and oriented, No acute distress. HEENT: [place] AT, moist mucus membranes. Trachea midline, no masses. Cardiovascular: No clubbing, cyanosis, or edema.  Respiratory: Normal respiratory effort, no increased work of breathing. GI: Abdomen is soft, non tender, non distended, no abdominal masses. Liver and spleen not palpable.  No hernias appreciated.  Stool sample for occult testing is not indicated.   GU: No CVA tenderness.  No bladder fullness or masses.  Atrophic external genitalia, normal pubic hair distribution, no lesions.  Normal urethral meatus, no lesions, no prolapse, no discharge.   No urethral masses, tenderness and/or tenderness. No bladder fullness, tenderness or masses. Pale vagina mucosa, poor estrogen effect, no discharge, no lesions, good pelvic support, no cystocele or rectocele noted.  No cervical motion tenderness.  Uterus is freely mobile and non-fixed.  No adnexal/parametria masses or tenderness noted.  Anus and perineum are without rashes or lesions.    Skin: No rashes, bruises or suspicious lesions. Lymph: No cervical or inguinal adenopathy. Neurologic: Grossly intact, no focal deficits, moving all 4 extremities. Psychiatric: Normal mood and affect.  Laboratory Data: Lab Results  Component Value Date   WBC 8.9 11/18/2017   HGB 12.4 11/18/2017   HCT 37.1 11/18/2017   MCV 91.0 11/18/2017   PLT 507 (H) 11/18/2017    Lab Results  Component Value Date   CREATININE 0.46 11/18/2017    Lab Results  Component Value Date   HGBA1C 6.5 (H) 02/20/2017    Lab Results  Component Value Date   AST 26 11/18/2017   Lab Results  Component Value Date   ALT 8 (L) 11/18/2017   I have reviewed the  labs.   Pertinent Imaging: CLINICAL DATA:  72 year old female with recent UTI. Confusion, subsequent delirium, nausea, vomiting.  EXAM: CT CHEST, ABDOMEN, AND PELVIS WITH CONTRAST  TECHNIQUE: Multidetector CT imaging of the chest, abdomen and pelvis was performed following the standard protocol during bolus administration of intravenous contrast.  CONTRAST:  150m ISOVUE-300 IOPAMIDOL (ISOVUE-300) INJECTION 61%  COMPARISON:  Chest radiographs 10/28/2016. Thoracic spine CT 02/04/2016. Lumbar MRI 07/13/2015. Chest CTA 02/26/2015.  FINDINGS: CT CHEST FINDINGS  Cardiovascular: Chronic Calcified aortic atherosclerosis. Calcified coronary artery atherosclerosis. Mild cardiomegaly. No pericardial effusion. Major mediastinal vascular structures that central mediastinal vascular structures appear patent.  Mediastinum/Nodes: Negative.  No mediastinal lymphadenopathy.  Lungs/Pleura: Major airways are patent. Right greater than left dependent pulmonary atelectasis. Centrilobular emphysema. Posterior basal segment lower lobe bronchiectasis (series 5, image 90). No pleural effusion or other abnormal pulmonary opacity.  Musculoskeletal: Osteopenia. Chronic thoracic compression fractures of T5, T6 and T12. Augmentation of the T12 vertebral body since July 2017 with chronic retropulsion of bone.  New since 02/04/2016 severe compression fractures of T8 and T10. The T10 level was mildly compressed on 10/28/2016. The T8 compression appears new since 10/28/2016, but is similarly sclerotic to the T10 level.  Proximal right humerus deformity appears chronic. Similar nonacute appearing intermittent left rib fractures.  CT ABDOMEN PELVIS FINDINGS  Hepatobiliary: Negative liver and gallbladder.  Pancreas: Negative.  Spleen: Negative.  Adrenals/Urinary Tract: Chronic left adrenal gland thickening is stable since 2016. Negative right adrenal gland. Small chronic  left renal cyst. Bilateral renal enhancement and contrast excretion is within normal limits. No hydronephrosis or hydroureter.  Diminutive and unremarkable urinary bladder.  Stomach/Bowel: Decompressed rectosigmoid colon. Minimal sigmoid diverticular without active inflammation. Decompressed left colon. Oral contrast has reached the splenic flexure. Redundant transverse colon intermittently distended with gas and oral contrast. Abrupt caliber change in the distal transverse segment on coronal image 28 is felt to reflect bowel peristalsis. Interposed hepatic flexure between the liver and anterior diaphragm. The right colon  is felt with oral contrast. Normal retrocecal appendix. Negative terminal ileum. No dilated small bowel. Negative stomach and duodenum.  No abdominal free fluid or free air.  Vascular/Lymphatic: Extensive Aortoiliac calcified atherosclerosis. Ectatic infrarenal abdominal aorta. Major arterial structures in the abdomen and pelvis appear patent.  Portal venous system appears patent.  No lymphadenopathy.  Reproductive: Negative.  Other: No pelvic free fluid. Trace right ventral abdominal wall subcutaneous gas (series 3, image 71) likely related to recent subcutaneous injection.  Musculoskeletal: Osteopenia. Chronic left pubic rami fractures. Proximal right femur ORIF. No acute osseous abnormality identified. Chronic L1 and L5 compression fractures previously augmented. Chronic postoperative changes to the L2-L3 and L3-L4 levels.  IMPRESSION: 1. No acute findings in the chest aside from dependent atelectasis. 2. Aortic Atherosclerosis (ICD10-I70.0) and Emphysema (ICD10-J43.9), and lower lobe Bronchiectasis. 3. No acute or inflammatory process identified in the abdomen or pelvis. No bowel obstruction. 4. Chronic vertebral compression fractures with progression in the thoracic spine since April 2018. 5. Chronic proximal right humerus fracture and  left rib fractures. Chronic postoperative changes to the lumbar spine.   Electronically Signed   By: Genevie Ann M.D.   On: 05/14/2017 12:06    Assessment & Plan:    1. Recurrent UTI's Criteria for recurrent UTI has been met with 2 or more infections in 6 months or 3 or greater infections in one year  Patient is again instructed to increase their water intake until the urine is pale yellow or clear (10 to 12 cups daily) - her daughter will be staying with her in the future and will make sure she will drink more water Patient will restart her probiotics and her PCP advised her just to take a multivitamin daily advised them to have CATH UA's for urinalysis and culture to prevent skin contamination of the specimen She and her daughter are instructed to contact our office for any signs of a breakthrough infection Will speak with Dr. Mervyn Skeeters regarding an ID referral   2. Vaginal atrophy  -Patient will continue the vaginal estrogen cream 3 nights weekly  - She will follow up in 12 months for an exam.                                          Return in about 1 year (around 01/10/2019) for OAB questionnaire, PVR and exam.  These notes generated with voice recognition software. I apologize for typographical errors.  Zara Council, PA-C  [facility] [address] [place], [place] [address] [phone]

## 2018-01-09 ENCOUNTER — Encounter: Payer: Self-pay | Admitting: Urology

## 2018-01-09 ENCOUNTER — Ambulatory Visit (INDEPENDENT_AMBULATORY_CARE_PROVIDER_SITE_OTHER): Payer: Medicare Other | Admitting: Urology

## 2018-01-09 VITALS — BP 136/79 | HR 96 | Ht 62.0 in | Wt 104.0 lb

## 2018-01-09 DIAGNOSIS — N952 Postmenopausal atrophic vaginitis: Secondary | ICD-10-CM | POA: Diagnosis not present

## 2018-01-09 DIAGNOSIS — N390 Urinary tract infection, site not specified: Secondary | ICD-10-CM | POA: Diagnosis not present

## 2018-01-09 MED ORDER — ESTRADIOL 0.1 MG/GM VA CREA: TOPICAL_CREAM | VAGINAL | 12 refills | Status: AC

## 2018-01-16 ENCOUNTER — Telehealth: Payer: Self-pay

## 2018-01-16 NOTE — Telephone Encounter (Signed)
Michelle Cabrera daughter was going to speak to Dr. Ellis Savage regarding a referral to an infectious disease specialist.  Was she able to do this?

## 2018-01-16 NOTE — Telephone Encounter (Signed)
Pt dtr, Misty Stanley state pt saw you last Friday in [place] office. Pt had been admitted prior to that with Sepsis from a UTI. Pt daughter wanted to let you know that she is currently back in the hospital with a uti. Pt daughter states she they are still not sure where these infections are coming from. If you have any questions you can reach pt daughter Misty Stanley.

## 2018-01-19 NOTE — Telephone Encounter (Signed)
Spoke with patient's daughter and she states that patient had an apt on 7-19 with Dr. Ellis Savage and will discuss at this visit. Patient is currently still admitted and will be discharged tomorrow and will have IV abx 2wks after discharge

## 2018-02-05 ENCOUNTER — Ambulatory Visit: Payer: Medicare Other

## 2018-02-20 ENCOUNTER — Ambulatory Visit (INDEPENDENT_AMBULATORY_CARE_PROVIDER_SITE_OTHER): Payer: Medicare Other | Admitting: Vascular Surgery

## 2018-02-20 ENCOUNTER — Ambulatory Visit (INDEPENDENT_AMBULATORY_CARE_PROVIDER_SITE_OTHER): Payer: Medicare Other

## 2018-02-20 ENCOUNTER — Encounter (INDEPENDENT_AMBULATORY_CARE_PROVIDER_SITE_OTHER): Payer: Self-pay | Admitting: Vascular Surgery

## 2018-02-20 ENCOUNTER — Encounter (INDEPENDENT_AMBULATORY_CARE_PROVIDER_SITE_OTHER): Payer: Medicare Other

## 2018-02-20 VITALS — BP 138/80 | HR 91 | Resp 13 | Ht 62.0 in | Wt 98.0 lb

## 2018-02-20 DIAGNOSIS — I6523 Occlusion and stenosis of bilateral carotid arteries: Secondary | ICD-10-CM

## 2018-02-20 DIAGNOSIS — I771 Stricture of artery: Secondary | ICD-10-CM | POA: Diagnosis not present

## 2018-02-20 DIAGNOSIS — I10 Essential (primary) hypertension: Secondary | ICD-10-CM | POA: Diagnosis not present

## 2018-02-20 DIAGNOSIS — K219 Gastro-esophageal reflux disease without esophagitis: Secondary | ICD-10-CM | POA: Diagnosis not present

## 2018-02-20 NOTE — Assessment & Plan Note (Signed)
Right carotid artery stenosis in the 1 to 39% range.  Left carotid artery stenosis just into the 40 to 59% range.  No role for intervention at this time.  Recheck in 1 year

## 2018-02-20 NOTE — Assessment & Plan Note (Signed)
Status post stent.  Appears patent by duplex.  Arm blood pressures are equal

## 2018-02-20 NOTE — Progress Notes (Signed)
MRN : [id number]  Michelle Cabrera is a 72 y.o. ([birth year]) female who presents with chief complaint of  Chief Complaint  Patient presents with  . Carotid    1 year follow up US  .  History of Present Illness: Patient returns in follow-up of her carotid and subclavian disease.  She is status post left subclavian stent 2 to 3 years ago.  No current arm claudication or ulceration symptoms.  No focal neurologic symptoms. Specifically, the patient denies amaurosis fugax, speech or swallowing difficulties, or arm or leg weakness or numbness.  He has had a fall with a broken hip and her arthritis is very bothersome at this point. Duplex done today. Right carotid artery stenosis in the 1 to 39% range.  Left carotid artery stenosis just into the 40 to 59% range.  Current Outpatient Medications  Medication Sig Dispense Refill  . Ascorbic Acid (VITAMIN C ADULT GUMMIES PO) Take by mouth.    . beta carotene (BETA CAROTENE PROVITAMIN A) 25000 UNIT capsule Take 25,000 Units by mouth daily.    Marland Kitchen conjugated estrogens (PREMARIN) vaginal cream Apply 0.5mg  (pea-sized amount)  just inside the vaginal introitus with a finger-tip Monday, Wednesday and Friday nights. 30 g 12  . diclofenac sodium (VOLTAREN) 1 % GEL Apply 2 g topically 4 (four) times daily.    . DULoxetine (CYMBALTA) 60 MG capsule Take 60 mg by mouth every morning.    Marland Kitchen estradiol (ESTRACE VAGINAL) 0.1 MG/GM vaginal cream Apply 0.5mg  (pea-sized amount)  just inside the vaginal introitus with a finger-tip every night for two weeks and then Monday, Wednesday and Friday nights. 30 g 12  . folic acid (FOLVITE) 1 MG tablet Take 1 mg by mouth daily.    Marland Kitchen HYDROcodone-acetaminophen (NORCO) 5-325 MG tablet Take 1 tablet by mouth every 6 (six) hours as needed for moderate pain. 30 tablet 0  . methenamine (MANDELAMINE) 1 g tablet TAKE 1 TABLET (1 G TOTAL) BY MOUTH TWO (2) TIMES A DAY.  11  . morphine (MS CONTIN) 15 MG 12 hr tablet Take 1 tablet (15 mg total)  by mouth every 12 (twelve) hours. 20 tablet 0  . nitrofurantoin, macrocrystal-monohydrate, (MACROBID) 100 MG capsule   0  . omeprazole (PRILOSEC) 20 MG capsule Take 20 mg by mouth 2 (two) times daily.     . predniSONE (DELTASONE) 5 MG tablet Take by mouth.     No current facility-administered medications for this visit.     Past Medical History:  Diagnosis Date  . Anxiety   . Arthritis   . Cataract   . Cervicalgia   . Chronic lower back pain   . Closed compression fracture of L1 lumbar vertebra 11/2012  . Collagen vascular disease (HCC)   . COPD (chronic obstructive pulmonary disease) (HCC)    unspecified  . DDD (degenerative disc disease)   . Depression   . Diabetes mellitus (HCC)   . Disorder of bursae and tendons in shoulder region    unspecified  . Fall 09/27/2016   Fall Feb 04, 2016 with broken vertebra  . GERD (gastroesophageal reflux disease)   . Headache   . Hereditary and idiopathic peripheral neuropathy    unspecified  . History of adenomatous polyp of colon    Followed by Dr. Markham Jordan  . History of tubal ligation   . Hypertension   . Intermediate coronary syndrome (HCC)   . Lumbago   . Neuralgia and neuritis, unspecified    radiculitis unspecified  .  Rheumatoid arthritis (HCC)   . Rheumatoid arthritis (HCC)   . Senile osteoporosis   . Spinal stenosis, lumbar region without neurogenic claudication     Past Surgical History:  Procedure Laterality Date  . BACK SURGERY    . BREAST BIOPSY Left    negative  . CATARACT EXTRACTION W/ INTRAOCULAR LENS  IMPLANT, BILATERAL Bilateral   . COLONOSCOPY  08/17/2007   Adenomatous Polyps  . COLONOSCOPY  10/09/2010   PH Adenomatous polyps : [facility] 09/2015 ; Recall Ltr mailed 08/17/2015 (dw)  . ESOPHAGOGASTRODUODENOSCOPY  01/14/2008   No repeat per RTE  . FINGER ARTHROPLASTY Right 09/15/2014   Procedure: RIGHT MIDDLE FINGER, RING FINGER, SMALL FINGER EXTENSOR DIGITORUM COMMUMIS STABILIZATION WITH RIGHT MIDDLE FINGER MCP  ARTHROPLASTY, POSSIBLE RING FINGER AND SMALL FINGER MCP ARTHROPLASTY;  Surgeon: Dominica Severin, MD;  Location: [facility];  Service: Orthopedics;  Laterality: Right;  . FOOT SURGERY Bilateral   . HAND TENDON SURGERY Right 09/15/2014   "d/t RA"  . HARDWARE REMOVAL Right 04/16/2017   Procedure: HARDWARE REMOVAL;  Surgeon: Donato Heinz, MD;  Location: [facility];  Service: Orthopedics;  Laterality: Right;  . HUMERUS FRACTURE SURGERY Left   . INCISION AND DRAINAGE OF WOUND Right 04/16/2017   Procedure: IRRIGATION AND DEBRIDEMENT WOUND;  Surgeon: Peggye Form, DO;  Location: [facility];  Service: Plastics;  Laterality: Right;  . INTRAMEDULLARY (IM) NAIL INTERTROCHANTERIC Right 02/19/2017   Procedure: INTRAMEDULLARY (IM) NAIL INTERTROCHANTRIC;  Surgeon: Donato Heinz, MD;  Location: [facility];  Service: Orthopedics;  Laterality: Right;  . KYPHOPLASTY N/A 01/04/2013   Procedure: L1 Kyphoplasty;  Surgeon: Hewitt Shorts, MD;  Location: [facility];  Service: Neurosurgery;  Laterality: N/A;  L1 Kyphoplasty  . KYPHOPLASTY N/A 02/07/2014   Procedure: THORACIC SIX KYPHOPLASTY;  Surgeon: Hewitt Shorts, MD;  Location: [facility];  Service: Neurosurgery;  Laterality: N/A;  T6 Kyphoplasty   . KYPHOPLASTY N/A 02/08/2016   Procedure: KYPHOPLASTY  T-12;  Surgeon: Kennedy Bucker, MD;  Location: [facility];  Service: Orthopedics;  Laterality: N/A;  . KYPHOPLASTY N/A 10/01/2016   Procedure: KYPHOPLASTY  L-5;  Surgeon: Kennedy Bucker, MD;  Location: [facility];  Service: Orthopedics;  Laterality: N/A;  . LUMBAR DISC SURGERY  2014    X 3  . LUMBAR LAMINECTOMY    . ORIF ELBOW FRACTURE Right 02/19/2017   Procedure: OPEN REDUCTION INTERNAL FIXATION (ORIF) ELBOW/OLECRANON FRACTURE;  Surgeon: Donato Heinz, MD;  Location: [facility];  Service: Orthopedics;  Laterality: Right;  . PERIPHERAL VASCULAR CATHETERIZATION Left 12/07/2015   Procedure: Upper Extremity Angiography;  Surgeon: Annice Needy, MD;  Location: [facility];  Service: Cardiovascular;  Laterality: Left;  . PERIPHERAL VASCULAR CATHETERIZATION  12/07/2015   Procedure: Upper Extremity Intervention;  Surgeon: Annice Needy, MD;  Location: [facility];  Service: Cardiovascular;;  . POSTERIOR LUMBAR FUSION     "got screws in"  . TONSILLECTOMY    . TUBAL LIGATION    . WRIST FUSION Right    30 yrs ago    Social History Social History   Tobacco Use  . Smoking status: Former Smoker    Packs/day: 1.00    Years: 56.00    Pack years: 56.00    Types: Cigarettes    Last attempt to quit: 07/25/2015    Years since quitting: 2.5  . Smokeless tobacco: Never Used  Substance Use Topics  . Alcohol use: No    Alcohol/week: 0.0 oz  .  Drug use: No    Family History Family History  Problem Relation Age of Onset  . CAD Father   . Alcohol abuse Father   . Heart disease Father   . CAD Mother   . Hypertension Mother   . Peripheral vascular disease Mother   . Osteoarthritis Mother   . Heart disease Mother   . Breast cancer Maternal Aunt 50  . Alcohol abuse Sister   . Arthritis Sister   . Cancer Sister   . Kidney cancer Neg Hx   . Kidney disease Neg Hx   . Prostate cancer Neg Hx     Allergies  Allergen Reactions  . Ambien [Zolpidem Tartrate] Other (See Comments)    Over sedation   . Dilaudid [Hydromorphone Hcl] Other (See Comments)    Made tongue swell  . Gold-Containing Drug Products Anaphylaxis  . Adhesive [Tape] Other (See Comments)    Tears skin  . Haldol [Haloperidol] Other (See Comments)    Hallucinations     REVIEW OF SYSTEMS (Negative unless checked)  Constitutional: [] Weight loss  [] Fever  [] Chills Cardiac: [] Chest pain   [] Chest pressure   [] Palpitations   [] Shortness of breath when laying flat   [] Shortness of breath at rest   [x] Shortness of breath with exertion. Vascular:  [] Pain in legs with walking   [] Pain in legs at rest   [] Pain in legs when laying flat   [] Claudication   [] Pain in feet when walking  [] Pain  in feet at rest  [] Pain in feet when laying flat   [] History of DVT   [] Phlebitis   [x] Swelling in legs   [] Varicose veins   [] Non-healing ulcers Pulmonary:   [] Uses home oxygen   [] Productive cough   [] Hemoptysis   [] Wheeze  [x] COPD   [] Asthma Neurologic:  [] Dizziness  [] Blackouts   [] Seizures   [] History of stroke   [] History of TIA  [] Aphasia   [] Temporary blindness   [] Dysphagia   [] Weakness or numbness in arms   [] Weakness or numbness in legs Musculoskeletal:  [x] Arthritis   [] Joint swelling   [x] Joint pain   [x] Low back pain Hematologic:  [] Easy bruising  [] Easy bleeding   [] Hypercoagulable state   [] Anemic  [] Hepatitis Gastrointestinal:  [] Blood in stool   [] Vomiting blood  [x] Gastroesophageal reflux/heartburn   [] Difficulty swallowing. Genitourinary:  [] Chronic kidney disease   [] Difficult urination  [] Frequent urination  [] Burning with urination   [] Blood in urine Skin:  [] Rashes   [] Ulcers   [] Wounds Psychological:  [] History of anxiety   []  History of major depression.  Physical Examination  Vitals:   02/20/18 1512 02/20/18 1513  BP: 106/80 138/80  Pulse: 91 91  Resp: 13   Weight: 98 lb (44.5 kg)   Height: 5\' 2"  (1.575 m)    Body mass index is 17.92 kg/m. Gen:  Thin and frail, NAD Head: [place]/AT, + temporalis wasting. Ear/Nose/Throat: Hearing grossly intact, nares w/o erythema or drainage, trachea midline Eyes: Conjunctiva clear. Sclera non-icteric Neck: Supple.  Right carotid bruit  Pulmonary:  Good air movement, equal and clear to auscultation bilaterally.  Cardiac: RRR, No JVD Vascular:  Vessel Right Left  Radial Palpable Palpable                                    Musculoskeletal: M/S 5/5 throughout.  No deformity or atrophy. Walking with a walker. No edema. Neurologic: CN 2-12 intact. Sensation grossly intact in  extremities.  Symmetrical.  Speech is fluent. Motor exam as listed above. Psychiatric: Judgment intact, Mood & affect appropriate for pt's clinical  situation. Dermatologic: No rashes or ulcers noted.  No cellulitis or open wounds.      CBC Lab Results  Component Value Date   WBC 8.9 11/18/2017   HGB 12.4 11/18/2017   HCT 37.1 11/18/2017   MCV 91.0 11/18/2017   PLT 507 (H) 11/18/2017    BMET    Component Value Date/Time   NA 135 11/18/2017 1814   K 3.7 11/18/2017 1814   CL 104 11/18/2017 1814   CO2 23 11/18/2017 1814   GLUCOSE 119 (H) 11/18/2017 1814   BUN 18 11/18/2017 1814   CREATININE 0.46 11/18/2017 1814   CALCIUM 8.4 (L) 11/18/2017 1814   GFRNONAA >60 11/18/2017 1814   GFRAA >60 11/18/2017 1814   CrCl cannot be calculated (Patient's most recent lab result is older than the maximum 21 days allowed.).  COAG Lab Results  Component Value Date   INR 1.14 02/19/2017   INR 1.18 02/04/2016   INR 1.01 01/04/2013    Radiology No results found.    Assessment/Plan Hypertension blood pressure control important in reducing the progression of atherosclerotic disease. On appropriate oral medications.   GERD (gastroesophageal reflux disease) Continue PPI as already ordered, this medication has been reviewed and there are no changes at this time.  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  Appears patent by duplex.  Arm blood pressures are equal  Bilateral carotid artery stenosis Right carotid artery stenosis in the 1 to 39% range.  Left carotid artery stenosis just into the 40 to 59% range.  No role for intervention at this time.  Recheck in 1 year    Festus Barren, MD  02/20/2018 3:24 PM    This note was created with Dragon medical transcription system.  Any errors from dictation are purely unintentional

## 2018-02-20 NOTE — Assessment & Plan Note (Signed)
Continue PPI as already ordered, this medication has been reviewed and there are no changes at this time. Avoidence of caffeine and alcohol Moderate elevation of the head of the bed  

## 2018-02-20 NOTE — Assessment & Plan Note (Signed)
blood pressure control important in reducing the progression of atherosclerotic disease. On appropriate oral medications.  

## 2018-03-06 ENCOUNTER — Ambulatory Visit: Payer: Self-pay

## 2019-01-15 ENCOUNTER — Ambulatory Visit: Payer: Self-pay | Admitting: Urology

## 2019-01-26 DIAGNOSIS — M2040 Other hammer toe(s) (acquired), unspecified foot: Secondary | ICD-10-CM | POA: Insufficient documentation

## 2019-01-26 DIAGNOSIS — M79673 Pain in unspecified foot: Secondary | ICD-10-CM | POA: Insufficient documentation

## 2019-02-23 ENCOUNTER — Ambulatory Visit (INDEPENDENT_AMBULATORY_CARE_PROVIDER_SITE_OTHER): Payer: Medicare Other | Admitting: Vascular Surgery

## 2019-02-23 ENCOUNTER — Encounter (INDEPENDENT_AMBULATORY_CARE_PROVIDER_SITE_OTHER): Payer: Medicare Other

## 2019-12-16 DIAGNOSIS — M2190 Unspecified acquired deformity of unspecified limb: Secondary | ICD-10-CM | POA: Insufficient documentation

## 2020-02-10 DIAGNOSIS — T8130XA Disruption of wound, unspecified, initial encounter: Secondary | ICD-10-CM | POA: Insufficient documentation

## 2020-02-10 DIAGNOSIS — IMO0002 Reserved for concepts with insufficient information to code with codable children: Secondary | ICD-10-CM | POA: Insufficient documentation

## 2020-02-10 DIAGNOSIS — M659 Synovitis and tenosynovitis, unspecified: Secondary | ICD-10-CM | POA: Insufficient documentation

## 2020-02-10 DIAGNOSIS — R600 Localized edema: Secondary | ICD-10-CM | POA: Insufficient documentation

## 2020-02-11 ENCOUNTER — Telehealth (INDEPENDENT_AMBULATORY_CARE_PROVIDER_SITE_OTHER): Payer: Self-pay | Admitting: Vascular Surgery

## 2020-02-11 NOTE — Telephone Encounter (Signed)
It appears the patient has not had any LE arterial studies with [place] previously.  She should come in for ABIs but she also was supposed to see [place] a year ago for the stent in her subclavian artery . So if we can fit her in for ABIs and a carotid within the next week or so with me or Dew that would be good

## 2020-02-11 NOTE — Telephone Encounter (Signed)
Patient needs to come in with any studies ?

## 2020-02-11 NOTE — Telephone Encounter (Signed)
Called stating that Dr.Viens requested that she come in to be seen per visit on yesterday 02-11-20. She is having circulation issues in both of her legs. 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 She would like for her to come in to be seen. Patient was last seen 02-20-18 with LE Arterial and Carotid studies (JD). Please advise.

## 2020-02-14 NOTE — Telephone Encounter (Signed)
Can you please schedule the patient for ABI's and a carotid ultrasound. Thank you

## 2020-03-01 ENCOUNTER — Encounter (INDEPENDENT_AMBULATORY_CARE_PROVIDER_SITE_OTHER): Payer: Self-pay | Admitting: Nurse Practitioner

## 2020-03-01 ENCOUNTER — Ambulatory Visit (INDEPENDENT_AMBULATORY_CARE_PROVIDER_SITE_OTHER): Payer: Medicare Other

## 2020-03-01 ENCOUNTER — Other Ambulatory Visit (INDEPENDENT_AMBULATORY_CARE_PROVIDER_SITE_OTHER): Payer: Self-pay | Admitting: Nurse Practitioner

## 2020-03-01 ENCOUNTER — Ambulatory Visit (INDEPENDENT_AMBULATORY_CARE_PROVIDER_SITE_OTHER): Payer: Medicare Other | Admitting: Nurse Practitioner

## 2020-03-01 ENCOUNTER — Other Ambulatory Visit: Payer: Self-pay

## 2020-03-01 VITALS — BP 149/83 | HR 98 | Resp 18 | Ht 60.0 in | Wt 137.0 lb

## 2020-03-01 DIAGNOSIS — G458 Other transient cerebral ischemic attacks and related syndromes: Secondary | ICD-10-CM | POA: Diagnosis not present

## 2020-03-01 DIAGNOSIS — M7989 Other specified soft tissue disorders: Secondary | ICD-10-CM | POA: Diagnosis not present

## 2020-03-01 DIAGNOSIS — Z95820 Peripheral vascular angioplasty status with implants and grafts: Secondary | ICD-10-CM

## 2020-03-01 DIAGNOSIS — M79605 Pain in left leg: Secondary | ICD-10-CM

## 2020-03-01 DIAGNOSIS — I6523 Occlusion and stenosis of bilateral carotid arteries: Secondary | ICD-10-CM | POA: Diagnosis not present

## 2020-03-01 DIAGNOSIS — M79604 Pain in right leg: Secondary | ICD-10-CM

## 2020-03-01 DIAGNOSIS — I771 Stricture of artery: Secondary | ICD-10-CM

## 2020-03-01 DIAGNOSIS — S81802A Unspecified open wound, left lower leg, initial encounter: Secondary | ICD-10-CM

## 2020-03-06 ENCOUNTER — Encounter (INDEPENDENT_AMBULATORY_CARE_PROVIDER_SITE_OTHER): Payer: Self-pay | Admitting: Nurse Practitioner

## 2020-03-06 NOTE — Progress Notes (Signed)
Subjective:    Patient ID: Michelle Cabrera, female    DOB: 09-27-1945, 74 y.o.   MRN: [id number] Chief Complaint  Patient presents with  . Follow-up    ultrasound    The patient is seen for follow up evaluation of carotid stenosis. The carotid stenosis followed by ultrasound.   The patient denies amaurosis fugax. There is no recent history of TIA symptoms or focal motor deficits. There is no prior documented CVA.  The patient is taking enteric-coated aspirin 81 mg daily.  There is no history of migraine headaches. There is no history of seizures.  The patient has a history of coronary artery disease, no recent episodes of angina or shortness of breath. The patient denies PAD or claudication symptoms. There is a history of hyperlipidemia which is being treated with a statin.    Duplex ultrasound shows 1 to 39% stenosis of the right internal carotid artery with 40 to 59% stenosis of the left.  The patient also complains of bilateral lower extremity leg pain.  The patient also notes that she has bilateral lower extremity edema which is very sore and painful to the touch.  The patient was unable to tolerate the blood pressure cuff or ABIs however she has a right TBI 0.66 and on the left 1.05.  The patient had biphasic/triphasic waveforms seen at the level of the tibial arteries.  The patient denies any lower extremity ulceration.  The patient has been warned medical grade 1 compression stockings however currently they are just so painful to place due to her extensive swelling.   Review of Systems  Cardiovascular: Positive for leg swelling.  Neurological: Positive for weakness.  Hematological: Bruises/bleeds easily.  All other systems reviewed and are negative.      Objective:   Physical Exam Vitals reviewed.  HENT:     Head: Normocephalic.  Cardiovascular:     Rate and Rhythm: Normal rate and regular rhythm.     Pulses: Normal pulses.     Heart sounds: Normal heart sounds.    Pulmonary:     Effort: Pulmonary effort is normal.     Breath sounds: Normal breath sounds.  Musculoskeletal:     Right lower leg: 2+ Pitting Edema present.     Left lower leg: 2+ Pitting Edema present.  Skin:    General: Skin is warm and dry.  Neurological:     Mental Status: She is alert and oriented to person, place, and time.     Motor: Weakness present.     Gait: Gait abnormal.  Psychiatric:        Mood and Affect: Mood normal.        Behavior: Behavior normal.        Thought Content: Thought content normal.        Judgment: Judgment normal.     BP (!) 149/83   Pulse 98   Resp 18   Ht 5' (1.524 m)   Wt 137 lb (62.1 kg)   BMI 26.76 kg/m   Past Medical History:  Diagnosis Date  . Anxiety   . Arthritis   . Cataract   . Cervicalgia   . Chronic lower back pain   . Closed compression fracture of L1 lumbar vertebra 11/2012  . Collagen vascular disease (HCC)   . COPD (chronic obstructive pulmonary disease) (HCC)    unspecified  . DDD (degenerative disc disease)   . Depression   . Diabetes mellitus (HCC)   . Disorder of bursae and  tendons in shoulder region    unspecified  . Fall 09/27/2016   Fall Feb 04, 2016 with broken vertebra  . GERD (gastroesophageal reflux disease)   . Headache   . Hereditary and idiopathic peripheral neuropathy    unspecified  . History of adenomatous polyp of colon    Followed by Dr. Markham Jordan  . History of tubal ligation   . Hypertension   . Intermediate coronary syndrome (HCC)   . Lumbago   . Neuralgia and neuritis, unspecified    radiculitis unspecified  . Rheumatoid arthritis (HCC)   . Rheumatoid arthritis (HCC)   . Senile osteoporosis   . Spinal stenosis, lumbar region without neurogenic claudication     Social History   Socioeconomic History  . Marital status: Married    Spouse name: Not on file  . Number of children: 2  . Years of education: 47  . Highest education level: Not on file  Occupational History  .  Occupation: retired  Tobacco Use  . Smoking status: Former Smoker    Packs/day: 1.00    Years: 56.00    Pack years: 56.00    Types: Cigarettes    Quit date: 07/25/2015    Years since quitting: 4.6  . Smokeless tobacco: Never Used  Vaping Use  . Vaping Use: Never used  Substance and Sexual Activity  . Alcohol use: No    Alcohol/week: 0.0 standard drinks  . Drug use: No  . Sexual activity: Yes  Other Topics Concern  . Not on file  Social History Narrative   Lives with husband at home   Social Determinants of Health   Financial Resource Strain:   . Difficulty of Paying Living Expenses:   Food Insecurity:   . Worried About Programme researcher, broadcasting/film/video in the Last Year:   . Barista in the Last Year:   Transportation Needs:   . Freight forwarder (Medical):   Marland Kitchen Lack of Transportation (Non-Medical):   Physical Activity:   . Days of Exercise per Week:   . Minutes of Exercise per Session:   Stress:   . Feeling of Stress :   Social Connections:   . Frequency of Communication with Friends and Family:   . Frequency of Social Gatherings with Friends and Family:   . Attends Religious Services:   . Active Member of Clubs or Organizations:   . Attends Banker Meetings:   Marland Kitchen Marital Status:   Intimate Partner Violence:   . Fear of Current or Ex-Partner:   . Emotionally Abused:   Marland Kitchen Physically Abused:   . Sexually Abused:     Past Surgical History:  Procedure Laterality Date  . BACK SURGERY    . BREAST BIOPSY Left    negative  . CATARACT EXTRACTION W/ INTRAOCULAR LENS  IMPLANT, BILATERAL Bilateral   . COLONOSCOPY  08/17/2007   Adenomatous Polyps  . COLONOSCOPY  10/09/2010   PH Adenomatous polyps : [facility] 09/2015 ; Recall Ltr mailed 08/17/2015 (dw)  . ESOPHAGOGASTRODUODENOSCOPY  01/14/2008   No repeat per RTE  . FINGER ARTHROPLASTY Right 09/15/2014   Procedure: RIGHT MIDDLE FINGER, RING FINGER, SMALL FINGER EXTENSOR DIGITORUM COMMUMIS STABILIZATION WITH RIGHT  MIDDLE FINGER MCP ARTHROPLASTY, POSSIBLE RING FINGER AND SMALL FINGER MCP ARTHROPLASTY;  Surgeon: Dominica Severin, MD;  Location: [facility];  Service: Orthopedics;  Laterality: Right;  . FOOT SURGERY Bilateral   . HAND TENDON SURGERY Right 09/15/2014   "d/t RA"  . HARDWARE REMOVAL Right 04/16/2017  Procedure: HARDWARE REMOVAL;  Surgeon: Donato Heinz, MD;  Location: [facility];  Service: Orthopedics;  Laterality: Right;  . HUMERUS FRACTURE SURGERY Left   . INCISION AND DRAINAGE OF WOUND Right 04/16/2017   Procedure: IRRIGATION AND DEBRIDEMENT WOUND;  Surgeon: Peggye Form, DO;  Location: [facility];  Service: Plastics;  Laterality: Right;  . INTRAMEDULLARY (IM) NAIL INTERTROCHANTERIC Right 02/19/2017   Procedure: INTRAMEDULLARY (IM) NAIL INTERTROCHANTRIC;  Surgeon: Donato Heinz, MD;  Location: [facility];  Service: Orthopedics;  Laterality: Right;  . KYPHOPLASTY N/A 01/04/2013   Procedure: L1 Kyphoplasty;  Surgeon: Hewitt Shorts, MD;  Location: [facility];  Service: Neurosurgery;  Laterality: N/A;  L1 Kyphoplasty  . KYPHOPLASTY N/A 02/07/2014   Procedure: THORACIC SIX KYPHOPLASTY;  Surgeon: Hewitt Shorts, MD;  Location: [facility];  Service: Neurosurgery;  Laterality: N/A;  T6 Kyphoplasty   . KYPHOPLASTY N/A 02/08/2016   Procedure: KYPHOPLASTY  T-12;  Surgeon: Kennedy Bucker, MD;  Location: [facility];  Service: Orthopedics;  Laterality: N/A;  . KYPHOPLASTY N/A 10/01/2016   Procedure: KYPHOPLASTY  L-5;  Surgeon: Kennedy Bucker, MD;  Location: [facility];  Service: Orthopedics;  Laterality: N/A;  . left foot  surgery     2nd and third toe amputaion  . LUMBAR DISC SURGERY  2014    X 3  . LUMBAR LAMINECTOMY    . ORIF ELBOW FRACTURE Right 02/19/2017   Procedure: OPEN REDUCTION INTERNAL FIXATION (ORIF) ELBOW/OLECRANON FRACTURE;  Surgeon: Donato Heinz, MD;  Location: [facility];  Service: Orthopedics;  Laterality: Right;  . PERIPHERAL VASCULAR CATHETERIZATION Left 12/07/2015   Procedure: Upper  Extremity Angiography;  Surgeon: Annice Needy, MD;  Location: [facility];  Service: Cardiovascular;  Laterality: Left;  . PERIPHERAL VASCULAR CATHETERIZATION  12/07/2015   Procedure: Upper Extremity Intervention;  Surgeon: Annice Needy, MD;  Location: [facility];  Service: Cardiovascular;;  . POSTERIOR LUMBAR FUSION     "got screws in"  . TONSILLECTOMY    . TUBAL LIGATION    . WRIST FUSION Right    30 yrs ago    Family History  Problem Relation Age of Onset  . CAD Father   . Alcohol abuse Father   . Heart disease Father   . CAD Mother   . Hypertension Mother   . Peripheral vascular disease Mother   . Osteoarthritis Mother   . Heart disease Mother   . Breast cancer Maternal Aunt 50  . Alcohol abuse Sister   . Arthritis Sister   . Cancer Sister   . Kidney cancer Neg Hx   . Kidney disease Neg Hx   . Prostate cancer Neg Hx     Allergies  Allergen Reactions  . Ambien [Zolpidem Tartrate] Other (See Comments)    Over sedation   . Dilaudid [Hydromorphone Hcl] Other (See Comments)    Made tongue swell  . Gold-Containing Drug Products Anaphylaxis  . Adhesive [Tape] Other (See Comments)    Tears skin  . Haldol [Haloperidol] Other (See Comments)    Hallucinations       Assessment & Plan:   1. Leg swelling This is likely the source of the patient's discomfort.  We have placed the patient in bilateral Unna wraps because she is not able to tolerate compression stockings.  The patient does not wish to go the full 4 weeks for Unna wrap so we will have her present in a week to evaluate lower extremity edema and and  discuss possible lymphedema pump.  2. Bilateral carotid artery stenosis Recommend:  Given the patient's asymptomatic subcritical stenosis no further invasive testing or surgery at this time.  Duplex ultrasound shows 1 to 39% stenosis of the right internal carotid artery with 40 to 59% stenosis of the left.  Continue antiplatelet therapy as  prescribed Continue management of CAD, HTN and Hyperlipidemia Healthy heart diet,  encouraged exercise at least 4 times per week Follow up in 12 months with duplex ultrasound and physical exam   3. Subclavian arterial stenosis (HCC) Today's bilateral subclavian arteries have normal flow hemodynamics.  There is no evidence of significant stenosis in the bilateral subclavian arteries.  We will continue to monitor with annual carotid duplex.  4. Pain in both lower extremities The patient's pain in her bilateral lower extremities is likely related to the swelling.  While full ABIs were not obtained today, imaging of the arteries revealed biphasic/triphasic waveforms in the bilateral tibial arteries.  TBI's are also acceptable as well.   Current Outpatient Medications on File Prior to Visit  Medication Sig Dispense Refill  . albuterol (VENTOLIN HFA) 108 (90 Base) MCG/ACT inhaler Inhale into the lungs.    . Ascorbic Acid (VITAMIN C ADULT GUMMIES PO) Take by mouth.    Marland Kitchen azelaic acid (AZELEX) 20 % cream Azelex 20 % topical cream    . beta carotene (BETA CAROTENE PROVITAMIN A) 25000 UNIT capsule Take 25,000 Units by mouth daily.    . Calcium Carbonate-Vit D-Min (CALCIUM 600+D PLUS MINERALS) 600-400 MG-UNIT TABS Take by mouth.    . cephALEXin (KEFLEX) 250 MG capsule cephalexin 250 mg capsule    . diclofenac sodium (VOLTAREN) 1 % GEL Apply 2 g topically 4 (four) times daily.    . DULoxetine (CYMBALTA) 60 MG capsule Take 60 mg by mouth every morning.    Marland Kitchen estradiol (ESTRACE VAGINAL) 0.1 MG/GM vaginal cream Apply 0.5mg  (pea-sized amount)  just inside the vaginal introitus with a finger-tip every night for two weeks and then Monday, Wednesday and Friday nights. 30 g 12  . fluticasone furoate-vilanterol (BREO ELLIPTA) 100-25 MCG/INH AEPB Inhale into the lungs.    . folic acid (FOLVITE) 1 MG tablet Take 1 mg by mouth daily.    . folic acid (FOLVITE) 1 MG tablet Take 1 tablet by mouth daily.    Marland Kitchen  HYDROcodone-acetaminophen (NORCO) 5-325 MG tablet Take 1 tablet by mouth every 6 (six) hours as needed for moderate pain. 30 tablet 0  . methenamine (MANDELAMINE) 1 g tablet TAKE 1 TABLET (1 G TOTAL) BY MOUTH TWO (2) TIMES A DAY.  11  . methotrexate 50 MG/2ML injection Inject into the skin.    Marland Kitchen morphine (MS CONTIN) 15 MG 12 hr tablet Take 1 tablet (15 mg total) by mouth every 12 (twelve) hours. 20 tablet 0  . nitrofurantoin, macrocrystal-monohydrate, (MACROBID) 100 MG capsule   0  . OLUMIANT 2 MG TABS tablet Take 2 mg by mouth at bedtime.    . silver sulfADIAZINE (SILVADENE) 1 % cream silver sulfadiazine 1 % topical cream    . conjugated estrogens (PREMARIN) vaginal cream Apply 0.5mg  (pea-sized amount)  just inside the vaginal introitus with a finger-tip Monday, Wednesday and Friday nights. (Patient not taking: Reported on 03/01/2020) 30 g 12  . omeprazole (PRILOSEC) 20 MG capsule Take 20 mg by mouth 2 (two) times daily.  (Patient not taking: Reported on 03/01/2020)     No current facility-administered medications on file prior to visit.    There  are no Patient Instructions on file for this visit. No follow-ups on file.   Kris Hartmann, NP

## 2020-03-09 ENCOUNTER — Ambulatory Visit (INDEPENDENT_AMBULATORY_CARE_PROVIDER_SITE_OTHER): Payer: Medicare Other | Admitting: Nurse Practitioner

## 2020-03-10 ENCOUNTER — Ambulatory Visit (INDEPENDENT_AMBULATORY_CARE_PROVIDER_SITE_OTHER): Payer: Medicare Other | Admitting: Vascular Surgery

## 2020-05-29 DEATH — deceased
